# Patient Record
Sex: Male | Born: 1945 | Race: White | Hispanic: No | Marital: Married | Smoking: Former smoker
Health system: Southern US, Community
[De-identification: ages and names within clinical notes are randomized; demographics above are authoritative.]

## PROBLEM LIST (undated history)

## (undated) DIAGNOSIS — R200 Anesthesia of skin: Secondary | ICD-10-CM

## (undated) DIAGNOSIS — Z8739 Personal history of other diseases of the musculoskeletal system and connective tissue: Secondary | ICD-10-CM

## (undated) DIAGNOSIS — I639 Cerebral infarction, unspecified: Secondary | ICD-10-CM

## (undated) DIAGNOSIS — I10 Essential (primary) hypertension: Secondary | ICD-10-CM

## (undated) DIAGNOSIS — K644 Residual hemorrhoidal skin tags: Secondary | ICD-10-CM

## (undated) DIAGNOSIS — E119 Type 2 diabetes mellitus without complications: Secondary | ICD-10-CM

## (undated) DIAGNOSIS — C787 Secondary malignant neoplasm of liver and intrahepatic bile duct: Secondary | ICD-10-CM

## (undated) DIAGNOSIS — K219 Gastro-esophageal reflux disease without esophagitis: Secondary | ICD-10-CM

## (undated) DIAGNOSIS — F32A Depression, unspecified: Secondary | ICD-10-CM

## (undated) DIAGNOSIS — C20 Malignant neoplasm of rectum: Secondary | ICD-10-CM

## (undated) DIAGNOSIS — I2510 Atherosclerotic heart disease of native coronary artery without angina pectoris: Secondary | ICD-10-CM

## (undated) DIAGNOSIS — J189 Pneumonia, unspecified organism: Secondary | ICD-10-CM

## (undated) DIAGNOSIS — I739 Peripheral vascular disease, unspecified: Secondary | ICD-10-CM

## (undated) DIAGNOSIS — E785 Hyperlipidemia, unspecified: Secondary | ICD-10-CM

## (undated) DIAGNOSIS — Z9289 Personal history of other medical treatment: Secondary | ICD-10-CM

## (undated) DIAGNOSIS — K648 Other hemorrhoids: Secondary | ICD-10-CM

## (undated) DIAGNOSIS — F329 Major depressive disorder, single episode, unspecified: Secondary | ICD-10-CM

## (undated) HISTORY — PX: VASCULAR SURGERY: SHX849

## (undated) HISTORY — DX: Essential (primary) hypertension: I10

## (undated) HISTORY — DX: Hyperlipidemia, unspecified: E78.5

## (undated) HISTORY — DX: Gastro-esophageal reflux disease without esophagitis: K21.9

## (undated) HISTORY — DX: Cerebral infarction, unspecified: I63.9

## (undated) HISTORY — PX: COLONOSCOPY W/ BIOPSIES AND POLYPECTOMY: SHX1376

---

## 2006-04-30 DIAGNOSIS — I639 Cerebral infarction, unspecified: Secondary | ICD-10-CM

## 2006-04-30 HISTORY — PX: CAROTID ENDARTERECTOMY: SUR193

## 2006-04-30 HISTORY — DX: Cerebral infarction, unspecified: I63.9

## 2006-06-16 ENCOUNTER — Encounter: Admission: RE | Admit: 2006-06-16 | Discharge: 2006-09-14 | Payer: Self-pay | Admitting: Family Medicine

## 2006-09-12 ENCOUNTER — Inpatient Hospital Stay (HOSPITAL_COMMUNITY): Admission: EM | Admit: 2006-09-12 | Discharge: 2006-09-16 | Payer: Self-pay | Admitting: Emergency Medicine

## 2006-09-12 ENCOUNTER — Encounter: Payer: Self-pay | Admitting: Cardiology

## 2006-09-13 ENCOUNTER — Ambulatory Visit: Payer: Self-pay | Admitting: *Deleted

## 2006-09-16 ENCOUNTER — Inpatient Hospital Stay (HOSPITAL_COMMUNITY)
Admission: RE | Admit: 2006-09-16 | Discharge: 2006-10-02 | Payer: Self-pay | Admitting: Physical Medicine & Rehabilitation

## 2006-09-16 ENCOUNTER — Ambulatory Visit: Payer: Self-pay | Admitting: Physical Medicine & Rehabilitation

## 2006-10-03 ENCOUNTER — Encounter
Admission: RE | Admit: 2006-10-03 | Discharge: 2007-01-01 | Payer: Self-pay | Admitting: Physical Medicine & Rehabilitation

## 2006-10-24 ENCOUNTER — Ambulatory Visit: Payer: Self-pay | Admitting: *Deleted

## 2006-11-12 ENCOUNTER — Ambulatory Visit (HOSPITAL_COMMUNITY): Admission: RE | Admit: 2006-11-12 | Discharge: 2006-11-13 | Payer: Self-pay | Admitting: *Deleted

## 2006-11-12 ENCOUNTER — Ambulatory Visit: Payer: Self-pay | Admitting: *Deleted

## 2006-11-12 ENCOUNTER — Encounter (INDEPENDENT_AMBULATORY_CARE_PROVIDER_SITE_OTHER): Payer: Self-pay | Admitting: *Deleted

## 2006-11-14 ENCOUNTER — Ambulatory Visit: Payer: Self-pay | Admitting: Physical Medicine & Rehabilitation

## 2006-11-14 ENCOUNTER — Encounter
Admission: RE | Admit: 2006-11-14 | Discharge: 2007-02-12 | Payer: Self-pay | Admitting: Physical Medicine & Rehabilitation

## 2006-12-12 ENCOUNTER — Ambulatory Visit: Payer: Self-pay | Admitting: *Deleted

## 2007-01-14 ENCOUNTER — Encounter
Admission: RE | Admit: 2007-01-14 | Discharge: 2007-01-24 | Payer: Self-pay | Admitting: Physical Medicine & Rehabilitation

## 2007-02-05 ENCOUNTER — Ambulatory Visit: Payer: Self-pay | Admitting: Physical Medicine & Rehabilitation

## 2007-02-27 ENCOUNTER — Ambulatory Visit: Payer: Self-pay | Admitting: *Deleted

## 2007-03-11 ENCOUNTER — Encounter
Admission: RE | Admit: 2007-03-11 | Discharge: 2007-04-23 | Payer: Self-pay | Admitting: Physical Medicine & Rehabilitation

## 2007-05-01 HISTORY — PX: MASS BIOPSY: SHX5445

## 2007-05-13 ENCOUNTER — Encounter: Admission: RE | Admit: 2007-05-13 | Discharge: 2007-06-12 | Payer: Self-pay | Admitting: Family Medicine

## 2007-06-17 ENCOUNTER — Encounter: Admission: RE | Admit: 2007-06-17 | Discharge: 2007-08-18 | Payer: Self-pay | Admitting: Family Medicine

## 2007-08-07 ENCOUNTER — Ambulatory Visit: Payer: Self-pay | Admitting: *Deleted

## 2008-01-27 ENCOUNTER — Encounter: Admission: RE | Admit: 2008-01-27 | Discharge: 2008-04-26 | Payer: Self-pay | Admitting: Family Medicine

## 2008-04-15 ENCOUNTER — Encounter: Payer: Self-pay | Admitting: Internal Medicine

## 2008-07-14 ENCOUNTER — Encounter: Payer: Self-pay | Admitting: Internal Medicine

## 2008-08-05 ENCOUNTER — Ambulatory Visit: Payer: Self-pay | Admitting: *Deleted

## 2008-08-17 ENCOUNTER — Encounter: Payer: Self-pay | Admitting: Internal Medicine

## 2008-08-17 DIAGNOSIS — E785 Hyperlipidemia, unspecified: Secondary | ICD-10-CM

## 2008-08-17 DIAGNOSIS — K649 Unspecified hemorrhoids: Secondary | ICD-10-CM | POA: Insufficient documentation

## 2008-08-18 ENCOUNTER — Ambulatory Visit: Payer: Self-pay | Admitting: Internal Medicine

## 2008-08-18 DIAGNOSIS — R143 Flatulence: Secondary | ICD-10-CM

## 2008-08-18 DIAGNOSIS — R142 Eructation: Secondary | ICD-10-CM

## 2008-08-18 DIAGNOSIS — R1084 Generalized abdominal pain: Secondary | ICD-10-CM

## 2008-08-18 DIAGNOSIS — K625 Hemorrhage of anus and rectum: Secondary | ICD-10-CM

## 2008-08-18 DIAGNOSIS — R141 Gas pain: Secondary | ICD-10-CM

## 2008-08-18 DIAGNOSIS — K5900 Constipation, unspecified: Secondary | ICD-10-CM | POA: Insufficient documentation

## 2008-08-27 ENCOUNTER — Ambulatory Visit: Payer: Self-pay | Admitting: Internal Medicine

## 2008-08-27 ENCOUNTER — Encounter: Payer: Self-pay | Admitting: Internal Medicine

## 2008-08-27 LAB — CONVERTED CEMR LAB
BUN: 6 mg/dL (ref 6–23)
CEA: 4.3 ng/mL (ref 0.0–5.0)
Creatinine, Ser: 0.7 mg/dL (ref 0.4–1.5)

## 2008-08-30 ENCOUNTER — Encounter: Payer: Self-pay | Admitting: Internal Medicine

## 2008-08-30 DIAGNOSIS — R198 Other specified symptoms and signs involving the digestive system and abdomen: Secondary | ICD-10-CM

## 2008-08-31 ENCOUNTER — Ambulatory Visit: Payer: Self-pay | Admitting: Hematology and Oncology

## 2008-09-01 ENCOUNTER — Ambulatory Visit: Payer: Self-pay | Admitting: Cardiology

## 2008-09-01 ENCOUNTER — Encounter: Payer: Self-pay | Admitting: Internal Medicine

## 2008-09-02 ENCOUNTER — Encounter: Payer: Self-pay | Admitting: Internal Medicine

## 2008-09-02 ENCOUNTER — Ambulatory Visit: Admission: RE | Admit: 2008-09-02 | Discharge: 2008-11-17 | Payer: Self-pay | Admitting: Radiation Oncology

## 2008-09-02 LAB — COMPREHENSIVE METABOLIC PANEL
AST: 23 U/L (ref 0–37)
Albumin: 3.7 g/dL (ref 3.5–5.2)
BUN: 8 mg/dL (ref 6–23)
CO2: 29 mEq/L (ref 19–32)
Calcium: 9.4 mg/dL (ref 8.4–10.5)
Chloride: 104 mEq/L (ref 96–112)
Glucose, Bld: 87 mg/dL (ref 70–99)
Potassium: 4.2 mEq/L (ref 3.5–5.3)

## 2008-09-02 LAB — CBC WITH DIFFERENTIAL/PLATELET
Basophils Absolute: 0 10*3/uL (ref 0.0–0.1)
Eosinophils Absolute: 0.2 10*3/uL (ref 0.0–0.5)
HCT: 45.9 % (ref 38.4–49.9)
HGB: 16.1 g/dL (ref 13.0–17.1)
LYMPH%: 40.5 % (ref 14.0–49.0)
MONO#: 0.9 10*3/uL (ref 0.1–0.9)
NEUT%: 47.4 % (ref 39.0–75.0)
Platelets: 230 10*3/uL (ref 140–400)
WBC: 8.8 10*3/uL (ref 4.0–10.3)
lymph#: 3.6 10*3/uL — ABNORMAL HIGH (ref 0.9–3.3)

## 2008-09-03 ENCOUNTER — Encounter: Payer: Self-pay | Admitting: Internal Medicine

## 2008-09-09 ENCOUNTER — Ambulatory Visit (HOSPITAL_COMMUNITY): Admission: RE | Admit: 2008-09-09 | Discharge: 2008-09-09 | Payer: Self-pay | Admitting: Hematology and Oncology

## 2008-09-10 ENCOUNTER — Ambulatory Visit (HOSPITAL_COMMUNITY): Admission: RE | Admit: 2008-09-10 | Discharge: 2008-09-10 | Payer: Self-pay | Admitting: Hematology and Oncology

## 2008-09-20 LAB — CBC WITH DIFFERENTIAL/PLATELET
BASO%: 0.4 % (ref 0.0–2.0)
Eosinophils Absolute: 0.2 10*3/uL (ref 0.0–0.5)
LYMPH%: 37.3 % (ref 14.0–49.0)
MCHC: 35.2 g/dL (ref 32.0–36.0)
MCV: 91.8 fL (ref 79.3–98.0)
MONO%: 8.8 % (ref 0.0–14.0)
Platelets: 229 10*3/uL (ref 140–400)
RBC: 4.99 10*6/uL (ref 4.20–5.82)

## 2008-09-20 LAB — COMPREHENSIVE METABOLIC PANEL
Alkaline Phosphatase: 84 U/L (ref 39–117)
Glucose, Bld: 100 mg/dL — ABNORMAL HIGH (ref 70–99)
Sodium: 136 mEq/L (ref 135–145)
Total Bilirubin: 0.3 mg/dL (ref 0.3–1.2)
Total Protein: 7.1 g/dL (ref 6.0–8.3)

## 2008-09-21 ENCOUNTER — Encounter: Payer: Self-pay | Admitting: Internal Medicine

## 2008-09-30 LAB — CBC WITH DIFFERENTIAL/PLATELET
BASO%: 0.3 % (ref 0.0–2.0)
Eosinophils Absolute: 0 10*3/uL (ref 0.0–0.5)
LYMPH%: 23.8 % (ref 14.0–49.0)
MCHC: 34.8 g/dL (ref 32.0–36.0)
MONO#: 0.8 10*3/uL (ref 0.1–0.9)
NEUT#: 2.8 10*3/uL (ref 1.5–6.5)
Platelets: 223 10*3/uL (ref 140–400)
RBC: 4.44 10*6/uL (ref 4.20–5.82)
RDW: 12.4 % (ref 11.0–14.6)
WBC: 4.8 10*3/uL (ref 4.0–10.3)

## 2008-10-08 LAB — CBC WITH DIFFERENTIAL/PLATELET
BASO%: 0.1 % (ref 0.0–2.0)
Eosinophils Absolute: 0.1 10*3/uL (ref 0.0–0.5)
HCT: 41 % (ref 38.4–49.9)
MCHC: 35.5 g/dL (ref 32.0–36.0)
MONO#: 0.7 10*3/uL (ref 0.1–0.9)
NEUT#: 3.1 10*3/uL (ref 1.5–6.5)
NEUT%: 68.4 % (ref 39.0–75.0)
RBC: 4.36 10*6/uL (ref 4.20–5.82)
WBC: 4.6 10*3/uL (ref 4.0–10.3)
lymph#: 0.6 10*3/uL — ABNORMAL LOW (ref 0.9–3.3)

## 2008-10-12 ENCOUNTER — Encounter: Payer: Self-pay | Admitting: Internal Medicine

## 2008-10-12 LAB — CBC WITH DIFFERENTIAL/PLATELET
Basophils Absolute: 0 10*3/uL (ref 0.0–0.1)
HCT: 41.9 % (ref 38.4–49.9)
HGB: 14.7 g/dL (ref 13.0–17.1)
MONO#: 0.6 10*3/uL (ref 0.1–0.9)
NEUT%: 71.8 % (ref 39.0–75.0)
Platelets: 224 10*3/uL (ref 140–400)
WBC: 4.7 10*3/uL (ref 4.0–10.3)
lymph#: 0.5 10*3/uL — ABNORMAL LOW (ref 0.9–3.3)

## 2008-10-12 LAB — BASIC METABOLIC PANEL
BUN: 9 mg/dL (ref 6–23)
CO2: 26 mEq/L (ref 19–32)
Chloride: 103 mEq/L (ref 96–112)
Creatinine, Ser: 0.85 mg/dL (ref 0.40–1.50)
Glucose, Bld: 135 mg/dL — ABNORMAL HIGH (ref 70–99)

## 2008-10-13 ENCOUNTER — Ambulatory Visit: Payer: Self-pay | Admitting: Hematology and Oncology

## 2008-10-13 LAB — URINALYSIS, MICROSCOPIC - CHCC
Leukocyte Esterase: NEGATIVE
Nitrite: NEGATIVE
Protein: 30 mg/dL
pH: 6 (ref 4.6–8.0)

## 2008-10-19 LAB — CBC WITH DIFFERENTIAL/PLATELET
BASO%: 0 % (ref 0.0–2.0)
Basophils Absolute: 0 10*3/uL (ref 0.0–0.1)
Eosinophils Absolute: 0.1 10*3/uL (ref 0.0–0.5)
HCT: 41.5 % (ref 38.4–49.9)
HGB: 14.8 g/dL (ref 13.0–17.1)
LYMPH%: 7.7 % — ABNORMAL LOW (ref 14.0–49.0)
MONO#: 0.6 10*3/uL (ref 0.1–0.9)
NEUT#: 4.6 10*3/uL (ref 1.5–6.5)
NEUT%: 79.7 % — ABNORMAL HIGH (ref 39.0–75.0)
Platelets: 312 10*3/uL (ref 140–400)
WBC: 5.8 10*3/uL (ref 4.0–10.3)
lymph#: 0.4 10*3/uL — ABNORMAL LOW (ref 0.9–3.3)

## 2008-10-21 LAB — URINALYSIS, MICROSCOPIC - CHCC
Ketones: NEGATIVE mg/dL
Nitrite: NEGATIVE
Protein: <30 mg/dL
Specific Gravity, Urine: 1.02 (ref 1.003–1.035)

## 2008-10-23 LAB — URINE CULTURE

## 2008-10-27 ENCOUNTER — Encounter: Payer: Self-pay | Admitting: Internal Medicine

## 2008-10-27 LAB — CBC WITH DIFFERENTIAL/PLATELET
BASO%: 0.2 % (ref 0.0–2.0)
Eosinophils Absolute: 0.1 10*3/uL (ref 0.0–0.5)
HCT: 39.9 % (ref 38.4–49.9)
MCHC: 35.2 g/dL (ref 32.0–36.0)
MONO#: 0.4 10*3/uL (ref 0.1–0.9)
NEUT#: 3.7 10*3/uL (ref 1.5–6.5)
Platelets: 326 10*3/uL (ref 140–400)
RBC: 4.23 10*6/uL (ref 4.20–5.82)
WBC: 4.7 10*3/uL (ref 4.0–10.3)
lymph#: 0.4 10*3/uL — ABNORMAL LOW (ref 0.9–3.3)

## 2008-10-30 ENCOUNTER — Emergency Department (HOSPITAL_COMMUNITY): Admission: EM | Admit: 2008-10-30 | Discharge: 2008-10-30 | Payer: Self-pay | Admitting: Emergency Medicine

## 2008-11-04 LAB — CBC WITH DIFFERENTIAL/PLATELET
BASO%: 0.8 % (ref 0.0–2.0)
Basophils Absolute: 0 10*3/uL (ref 0.0–0.1)
EOS%: 0.8 % (ref 0.0–7.0)
HCT: 39.6 % (ref 38.4–49.9)
HGB: 13.7 g/dL (ref 13.0–17.1)
MCH: 31.6 pg (ref 27.2–33.4)
MCHC: 34.6 g/dL (ref 32.0–36.0)
MCV: 91.5 fL (ref 79.3–98.0)
MONO%: 26.8 % — ABNORMAL HIGH (ref 0.0–14.0)
NEUT%: 59.3 % (ref 39.0–75.0)

## 2008-11-04 LAB — BASIC METABOLIC PANEL
BUN: 7 mg/dL (ref 6–23)
Calcium: 8.8 mg/dL (ref 8.4–10.5)
Creatinine, Ser: 0.86 mg/dL (ref 0.40–1.50)
Glucose, Bld: 110 mg/dL — ABNORMAL HIGH (ref 70–99)

## 2008-11-05 ENCOUNTER — Encounter: Payer: Self-pay | Admitting: Internal Medicine

## 2008-11-09 ENCOUNTER — Ambulatory Visit: Payer: Self-pay | Admitting: Internal Medicine

## 2008-11-09 DIAGNOSIS — C785 Secondary malignant neoplasm of large intestine and rectum: Secondary | ICD-10-CM | POA: Insufficient documentation

## 2008-11-09 DIAGNOSIS — R112 Nausea with vomiting, unspecified: Secondary | ICD-10-CM

## 2008-11-16 ENCOUNTER — Ambulatory Visit: Payer: Self-pay | Admitting: Hematology and Oncology

## 2008-11-18 LAB — CBC WITH DIFFERENTIAL/PLATELET
BASO%: 0.1 % (ref 0.0–2.0)
LYMPH%: 13.6 % — ABNORMAL LOW (ref 14.0–49.0)
MCHC: 34.3 g/dL (ref 32.0–36.0)
MONO#: 0.5 10*3/uL (ref 0.1–0.9)
RBC: 3.95 10*6/uL — ABNORMAL LOW (ref 4.20–5.82)
RDW: 16.1 % — ABNORMAL HIGH (ref 11.0–14.6)
WBC: 3.3 10*3/uL — ABNORMAL LOW (ref 4.0–10.3)
lymph#: 0.4 10*3/uL — ABNORMAL LOW (ref 0.9–3.3)

## 2008-11-30 ENCOUNTER — Encounter: Payer: Self-pay | Admitting: Internal Medicine

## 2008-11-30 LAB — CBC WITH DIFFERENTIAL/PLATELET
BASO%: 0.7 % (ref 0.0–2.0)
Basophils Absolute: 0 10*3/uL (ref 0.0–0.1)
EOS%: 7.4 % — ABNORMAL HIGH (ref 0.0–7.0)
HGB: 13.6 g/dL (ref 13.0–17.1)
MCH: 32.2 pg (ref 27.2–33.4)
MCHC: 34.3 g/dL (ref 32.0–36.0)
MCV: 93.9 fL (ref 79.3–98.0)
MONO%: 16.6 % — ABNORMAL HIGH (ref 0.0–14.0)
RDW: 15.8 % — ABNORMAL HIGH (ref 11.0–14.6)
lymph#: 0.6 10*3/uL — ABNORMAL LOW (ref 0.9–3.3)

## 2008-11-30 LAB — COMPREHENSIVE METABOLIC PANEL
ALT: 14 U/L (ref 0–53)
AST: 16 U/L (ref 0–37)
Albumin: 4 g/dL (ref 3.5–5.2)
Alkaline Phosphatase: 76 U/L (ref 39–117)
BUN: 6 mg/dL (ref 6–23)
Chloride: 107 mEq/L (ref 96–112)
Creatinine, Ser: 0.75 mg/dL (ref 0.40–1.50)
Potassium: 4.3 mEq/L (ref 3.5–5.3)

## 2008-12-15 ENCOUNTER — Encounter: Payer: Self-pay | Admitting: Internal Medicine

## 2009-01-17 ENCOUNTER — Ambulatory Visit: Payer: Self-pay | Admitting: Oncology

## 2009-01-20 ENCOUNTER — Ambulatory Visit (HOSPITAL_COMMUNITY): Admission: RE | Admit: 2009-01-20 | Discharge: 2009-01-20 | Payer: Self-pay | Admitting: Surgery

## 2009-01-20 ENCOUNTER — Encounter (INDEPENDENT_AMBULATORY_CARE_PROVIDER_SITE_OTHER): Payer: Self-pay | Admitting: Surgery

## 2009-02-08 LAB — CBC WITH DIFFERENTIAL/PLATELET
BASO%: 0.4 % (ref 0.0–2.0)
Basophils Absolute: 0 10*3/uL (ref 0.0–0.1)
EOS%: 2.6 % (ref 0.0–7.0)
HGB: 13.8 g/dL (ref 13.0–17.1)
MCH: 34.2 pg — ABNORMAL HIGH (ref 27.2–33.4)
MCV: 100.1 fL — ABNORMAL HIGH (ref 79.3–98.0)
MONO%: 12.8 % (ref 0.0–14.0)
RBC: 4.04 10*6/uL — ABNORMAL LOW (ref 4.20–5.82)
RDW: 13.2 % (ref 11.0–14.6)
lymph#: 0.9 10*3/uL (ref 0.9–3.3)

## 2009-02-08 LAB — COMPREHENSIVE METABOLIC PANEL
ALT: 16 U/L (ref 0–53)
AST: 20 U/L (ref 0–37)
Albumin: 4 g/dL (ref 3.5–5.2)
Alkaline Phosphatase: 69 U/L (ref 39–117)
Calcium: 9 mg/dL (ref 8.4–10.5)
Chloride: 104 mEq/L (ref 96–112)
Potassium: 4.2 mEq/L (ref 3.5–5.3)
Sodium: 139 mEq/L (ref 135–145)
Total Protein: 6.1 g/dL (ref 6.0–8.3)

## 2009-02-10 ENCOUNTER — Encounter: Payer: Self-pay | Admitting: Internal Medicine

## 2009-02-18 ENCOUNTER — Encounter: Payer: Self-pay | Admitting: Internal Medicine

## 2009-02-28 ENCOUNTER — Ambulatory Visit: Payer: Self-pay | Admitting: Oncology

## 2009-04-11 ENCOUNTER — Ambulatory Visit: Payer: Self-pay | Admitting: Oncology

## 2009-04-13 ENCOUNTER — Encounter: Payer: Self-pay | Admitting: Oncology

## 2009-04-13 ENCOUNTER — Ambulatory Visit: Admission: RE | Admit: 2009-04-13 | Discharge: 2009-04-13 | Payer: Self-pay | Admitting: Oncology

## 2009-04-13 ENCOUNTER — Ambulatory Visit: Payer: Self-pay | Admitting: Vascular Surgery

## 2009-05-09 ENCOUNTER — Ambulatory Visit: Payer: Self-pay | Admitting: Hematology and Oncology

## 2009-05-10 LAB — CBC WITH DIFFERENTIAL/PLATELET
BASO%: 0.5 % (ref 0.0–2.0)
Basophils Absolute: 0 10*3/uL (ref 0.0–0.1)
Eosinophils Absolute: 0.1 10*3/uL (ref 0.0–0.5)
HGB: 15.2 g/dL (ref 13.0–17.1)
LYMPH%: 26.1 % (ref 14.0–49.0)
MCH: 32.9 pg (ref 27.2–33.4)
MCHC: 34 g/dL (ref 32.0–36.0)
MCV: 96.5 fL (ref 79.3–98.0)
MONO%: 14.1 % — ABNORMAL HIGH (ref 0.0–14.0)
RBC: 4.64 10*6/uL (ref 4.20–5.82)

## 2009-05-10 LAB — COMPREHENSIVE METABOLIC PANEL
ALT: 24 U/L (ref 0–53)
Albumin: 4.2 g/dL (ref 3.5–5.2)
BUN: 11 mg/dL (ref 6–23)
Chloride: 103 mEq/L (ref 96–112)
Creatinine, Ser: 0.96 mg/dL (ref 0.40–1.50)
Total Bilirubin: 0.5 mg/dL (ref 0.3–1.2)

## 2009-05-10 LAB — CEA: CEA: 2.7 ng/mL (ref 0.0–5.0)

## 2009-05-11 ENCOUNTER — Encounter: Payer: Self-pay | Admitting: Internal Medicine

## 2009-05-13 ENCOUNTER — Ambulatory Visit (HOSPITAL_COMMUNITY): Admission: RE | Admit: 2009-05-13 | Discharge: 2009-05-13 | Payer: Self-pay | Admitting: Hematology and Oncology

## 2009-08-11 ENCOUNTER — Ambulatory Visit: Payer: Self-pay | Admitting: Thoracic Diseases

## 2009-08-25 ENCOUNTER — Ambulatory Visit: Payer: Self-pay | Admitting: Hematology and Oncology

## 2009-08-25 LAB — CBC WITH DIFFERENTIAL/PLATELET
EOS%: 2.3 % (ref 0.0–7.0)
Eosinophils Absolute: 0.1 10*3/uL (ref 0.0–0.5)
MCV: 97.5 fL (ref 79.3–98.0)
MONO%: 7.8 % (ref 0.0–14.0)
NEUT#: 3.8 10*3/uL (ref 1.5–6.5)
RBC: 4.49 10*6/uL (ref 4.20–5.82)
RDW: 13.8 % (ref 11.0–14.6)

## 2009-08-25 LAB — COMPREHENSIVE METABOLIC PANEL
AST: 32 U/L (ref 0–37)
Albumin: 3.6 g/dL (ref 3.5–5.2)
Alkaline Phosphatase: 67 U/L (ref 39–117)
Glucose, Bld: 135 mg/dL — ABNORMAL HIGH (ref 70–99)
Potassium: 4.1 mEq/L (ref 3.5–5.3)
Sodium: 139 mEq/L (ref 135–145)
Total Protein: 6.8 g/dL (ref 6.0–8.3)

## 2009-08-26 ENCOUNTER — Ambulatory Visit (HOSPITAL_COMMUNITY): Admission: RE | Admit: 2009-08-26 | Discharge: 2009-08-26 | Payer: Self-pay | Admitting: Hematology and Oncology

## 2009-08-31 ENCOUNTER — Encounter: Payer: Self-pay | Admitting: Internal Medicine

## 2009-10-18 ENCOUNTER — Encounter: Admission: RE | Admit: 2009-10-18 | Discharge: 2009-10-18 | Payer: Self-pay | Admitting: Family Medicine

## 2009-11-08 ENCOUNTER — Ambulatory Visit: Payer: Self-pay | Admitting: Vascular Surgery

## 2009-11-14 ENCOUNTER — Ambulatory Visit: Payer: Self-pay | Admitting: Vascular Surgery

## 2009-11-14 ENCOUNTER — Ambulatory Visit (HOSPITAL_COMMUNITY): Admission: RE | Admit: 2009-11-14 | Discharge: 2009-11-14 | Payer: Self-pay | Admitting: Vascular Surgery

## 2009-12-09 ENCOUNTER — Ambulatory Visit: Payer: Self-pay | Admitting: Hematology and Oncology

## 2009-12-13 LAB — CBC WITH DIFFERENTIAL/PLATELET
Basophils Absolute: 0 10*3/uL (ref 0.0–0.1)
Eosinophils Absolute: 0.2 10*3/uL (ref 0.0–0.5)
HCT: 42.6 % (ref 38.4–49.9)
HGB: 14.7 g/dL (ref 13.0–17.1)
MONO#: 0.5 10*3/uL (ref 0.1–0.9)
NEUT#: 4 10*3/uL (ref 1.5–6.5)
RDW: 13.7 % (ref 11.0–14.6)
lymph#: 1.4 10*3/uL (ref 0.9–3.3)

## 2009-12-13 LAB — COMPREHENSIVE METABOLIC PANEL
AST: 38 U/L — ABNORMAL HIGH (ref 0–37)
Albumin: 4.5 g/dL (ref 3.5–5.2)
BUN: 10 mg/dL (ref 6–23)
CO2: 23 mEq/L (ref 19–32)
Calcium: 9.5 mg/dL (ref 8.4–10.5)
Chloride: 104 mEq/L (ref 96–112)
Glucose, Bld: 161 mg/dL — ABNORMAL HIGH (ref 70–99)
Potassium: 4.2 mEq/L (ref 3.5–5.3)

## 2009-12-13 LAB — LACTATE DEHYDROGENASE: LDH: 192 U/L (ref 94–250)

## 2009-12-15 ENCOUNTER — Encounter: Payer: Self-pay | Admitting: Internal Medicine

## 2010-02-08 ENCOUNTER — Ambulatory Visit: Payer: Self-pay | Admitting: Vascular Surgery

## 2010-05-10 ENCOUNTER — Ambulatory Visit: Admit: 2010-05-10 | Payer: Self-pay | Admitting: Vascular Surgery

## 2010-05-10 ENCOUNTER — Ambulatory Visit
Admission: RE | Admit: 2010-05-10 | Discharge: 2010-05-10 | Payer: Self-pay | Source: Home / Self Care | Attending: Vascular Surgery | Admitting: Vascular Surgery

## 2010-05-18 ENCOUNTER — Other Ambulatory Visit: Payer: Self-pay | Admitting: Hematology and Oncology

## 2010-05-18 DIAGNOSIS — C210 Malignant neoplasm of anus, unspecified: Secondary | ICD-10-CM

## 2010-05-19 ENCOUNTER — Other Ambulatory Visit: Payer: Self-pay | Admitting: Hematology and Oncology

## 2010-05-19 DIAGNOSIS — C210 Malignant neoplasm of anus, unspecified: Secondary | ICD-10-CM

## 2010-05-21 ENCOUNTER — Encounter: Payer: Self-pay | Admitting: Physical Medicine & Rehabilitation

## 2010-05-22 ENCOUNTER — Encounter: Payer: Self-pay | Admitting: Hematology and Oncology

## 2010-05-30 NOTE — Letter (Signed)
[facility]   Imported By: Lester Paxtang 09/16/2009 12:34:50  _____________________________________________________________________  External Attachment:    Type:   Image     Comment:   External Document

## 2010-05-30 NOTE — Letter (Signed)
[facility]   Imported By: Lennie Odor 12/28/2009 14:46:50  _____________________________________________________________________  External Attachment:    Type:   Image     Comment:   External Document

## 2010-05-30 NOTE — Letter (Signed)
[facility]   Imported By: Sherian Rein 06/02/2009 07:24:04  _____________________________________________________________________  External Attachment:    Type:   Image     Comment:   External Document

## 2010-06-13 ENCOUNTER — Other Ambulatory Visit: Payer: Self-pay | Admitting: Hematology and Oncology

## 2010-06-13 DIAGNOSIS — C210 Malignant neoplasm of anus, unspecified: Secondary | ICD-10-CM

## 2010-06-14 ENCOUNTER — Other Ambulatory Visit (HOSPITAL_COMMUNITY): Payer: Self-pay

## 2010-06-14 NOTE — Procedures (Unsigned)
VASCULAR LAB EXAM  INDICATION:  Preoperative vein mapping.   HISTORY: Diabetes:  Yes. Cardiac:  No. Hypertension:  Yes.  EXAM:  Left GSV mapping.  IMPRESSION:  Patent and compressible left greater saphenous vein with diameter measurements ranging from 0.11 to 0.52 cm.      ___________________________________________ Di Kindle. Edilia Bo, M.D.  EM/MEDQ  D:  05/10/2010  T:  05/10/2010  Job:  [id number]

## 2010-06-19 ENCOUNTER — Other Ambulatory Visit: Payer: Self-pay | Admitting: Hematology and Oncology

## 2010-06-19 ENCOUNTER — Encounter (HOSPITAL_BASED_OUTPATIENT_CLINIC_OR_DEPARTMENT_OTHER): Payer: Medicare Other | Admitting: Hematology and Oncology

## 2010-06-19 ENCOUNTER — Ambulatory Visit (HOSPITAL_COMMUNITY)
Admission: RE | Admit: 2010-06-19 | Discharge: 2010-06-19 | Disposition: A | Discharge status: Home / Self Care | Payer: Medicare Other | Source: Ambulatory Visit | Attending: Hematology and Oncology | Admitting: Hematology and Oncology

## 2010-06-19 DIAGNOSIS — C210 Malignant neoplasm of anus, unspecified: Secondary | ICD-10-CM

## 2010-06-19 LAB — CMP (CANCER CENTER ONLY)
ALT(SGPT): 74 U/L — ABNORMAL HIGH (ref 10–47)
AST: 65 U/L — ABNORMAL HIGH (ref 11–38)
Albumin: 3.4 g/dL (ref 3.3–5.5)
Alkaline Phosphatase: 74 U/L (ref 26–84)
BUN, Bld: 11 mg/dL (ref 7–22)
CO2: 28 mEq/L (ref 18–33)
Calcium: 9.2 mg/dL (ref 8.0–10.3)
Creat: 0.9 mg/dl (ref 0.6–1.2)
Potassium: 4.8 mEq/L — ABNORMAL HIGH (ref 3.3–4.7)
Sodium: 142 mEq/L (ref 128–145)
Total Bilirubin: 0.7 mg/dl (ref 0.20–1.60)
Total Protein: 7.3 g/dL (ref 6.4–8.1)

## 2010-06-19 LAB — CBC WITH DIFFERENTIAL/PLATELET
BASO%: 0.5 % (ref 0.0–2.0)
EOS%: 2 % (ref 0.0–7.0)
HCT: 44.3 % (ref 38.4–49.9)
MCHC: 33.6 g/dL (ref 32.0–36.0)
MCV: 98.8 fL — ABNORMAL HIGH (ref 79.3–98.0)
Platelets: 260 10*3/uL (ref 140–400)
RBC: 4.49 10*6/uL (ref 4.20–5.82)
WBC: 5.3 10*3/uL (ref 4.0–10.3)

## 2010-06-19 MED ORDER — IOHEXOL 300 MG/ML  SOLN
100.0000 mL | Freq: Once | INTRAMUSCULAR | Status: AC | PRN
  Administered 2010-06-19: 100 mL via INTRAVENOUS

## 2010-06-21 ENCOUNTER — Encounter: Payer: Self-pay | Admitting: Internal Medicine

## 2010-06-21 ENCOUNTER — Other Ambulatory Visit: Payer: Self-pay | Admitting: Hematology and Oncology

## 2010-06-21 ENCOUNTER — Encounter (HOSPITAL_BASED_OUTPATIENT_CLINIC_OR_DEPARTMENT_OTHER): Payer: Medicare Other | Admitting: Hematology and Oncology

## 2010-06-21 DIAGNOSIS — C210 Malignant neoplasm of anus, unspecified: Secondary | ICD-10-CM

## 2010-07-04 ENCOUNTER — Encounter (HOSPITAL_COMMUNITY): Payer: Self-pay

## 2010-07-04 ENCOUNTER — Encounter (HOSPITAL_COMMUNITY)
Admission: RE | Admit: 2010-07-04 | Discharge: 2010-07-04 | Disposition: A | Discharge status: Home / Self Care | Payer: Medicare Other | Source: Ambulatory Visit | Attending: Hematology and Oncology | Admitting: Hematology and Oncology

## 2010-07-04 DIAGNOSIS — I77810 Thoracic aortic ectasia: Secondary | ICD-10-CM | POA: Insufficient documentation

## 2010-07-04 DIAGNOSIS — C210 Malignant neoplasm of anus, unspecified: Secondary | ICD-10-CM | POA: Insufficient documentation

## 2010-07-04 DIAGNOSIS — K8020 Calculus of gallbladder without cholecystitis without obstruction: Secondary | ICD-10-CM | POA: Insufficient documentation

## 2010-07-04 DIAGNOSIS — I700 Atherosclerosis of aorta: Secondary | ICD-10-CM | POA: Insufficient documentation

## 2010-07-04 DIAGNOSIS — K7689 Other specified diseases of liver: Secondary | ICD-10-CM | POA: Insufficient documentation

## 2010-07-04 DIAGNOSIS — I723 Aneurysm of iliac artery: Secondary | ICD-10-CM | POA: Insufficient documentation

## 2010-07-04 DIAGNOSIS — I2510 Atherosclerotic heart disease of native coronary artery without angina pectoris: Secondary | ICD-10-CM | POA: Insufficient documentation

## 2010-07-04 LAB — GLUCOSE, CAPILLARY: Glucose-Capillary: 141 mg/dL — ABNORMAL HIGH (ref 70–99)

## 2010-07-04 MED ORDER — FLUDEOXYGLUCOSE F - 18 (FDG) INJECTION
18.0000 | Freq: Once | INTRAVENOUS | Status: AC | PRN
  Administered 2010-07-04: 18 via INTRAVENOUS

## 2010-07-11 ENCOUNTER — Other Ambulatory Visit: Payer: Self-pay | Admitting: Hematology and Oncology

## 2010-07-11 DIAGNOSIS — B009 Herpesviral infection, unspecified: Secondary | ICD-10-CM

## 2010-07-11 NOTE — Letter (Signed)
Summary: [facility]  [facility]   Imported By: Lennie Odor 07/06/2010 14:12:40  _____________________________________________________________________  External Attachment:    Type:   Image     Comment:   External Document

## 2010-07-15 LAB — POCT I-STAT, CHEM 8
BUN: 12 mg/dL (ref 6–23)
Hemoglobin: 15.3 g/dL (ref 13.0–17.0)
Potassium: 4.3 mEq/L (ref 3.5–5.1)
Sodium: 140 mEq/L (ref 135–145)
TCO2: 24 mmol/L (ref 0–100)

## 2010-07-15 LAB — GLUCOSE, CAPILLARY
Glucose-Capillary: 129 mg/dL — ABNORMAL HIGH (ref 70–99)
Glucose-Capillary: 136 mg/dL — ABNORMAL HIGH (ref 70–99)

## 2010-07-19 ENCOUNTER — Other Ambulatory Visit: Payer: Self-pay | Admitting: Hematology and Oncology

## 2010-07-19 ENCOUNTER — Ambulatory Visit (HOSPITAL_COMMUNITY)
Admission: RE | Admit: 2010-07-19 | Discharge: 2010-07-19 | Disposition: A | Discharge status: Home / Self Care | Payer: Medicare Other | Source: Ambulatory Visit | Attending: Hematology and Oncology | Admitting: Hematology and Oncology

## 2010-07-19 ENCOUNTER — Ambulatory Visit (HOSPITAL_COMMUNITY): Payer: Medicare Other

## 2010-07-19 DIAGNOSIS — R29898 Other symptoms and signs involving the musculoskeletal system: Secondary | ICD-10-CM | POA: Insufficient documentation

## 2010-07-19 DIAGNOSIS — Z01812 Encounter for preprocedural laboratory examination: Secondary | ICD-10-CM | POA: Insufficient documentation

## 2010-07-19 DIAGNOSIS — M109 Gout, unspecified: Secondary | ICD-10-CM | POA: Insufficient documentation

## 2010-07-19 DIAGNOSIS — C210 Malignant neoplasm of anus, unspecified: Secondary | ICD-10-CM | POA: Insufficient documentation

## 2010-07-19 DIAGNOSIS — K219 Gastro-esophageal reflux disease without esophagitis: Secondary | ICD-10-CM | POA: Insufficient documentation

## 2010-07-19 DIAGNOSIS — K769 Liver disease, unspecified: Secondary | ICD-10-CM

## 2010-07-19 DIAGNOSIS — B009 Herpesviral infection, unspecified: Secondary | ICD-10-CM

## 2010-07-19 DIAGNOSIS — I10 Essential (primary) hypertension: Secondary | ICD-10-CM | POA: Insufficient documentation

## 2010-07-19 DIAGNOSIS — I69998 Other sequelae following unspecified cerebrovascular disease: Secondary | ICD-10-CM | POA: Insufficient documentation

## 2010-07-19 DIAGNOSIS — Z5309 Procedure and treatment not carried out because of other contraindication: Secondary | ICD-10-CM | POA: Insufficient documentation

## 2010-07-19 DIAGNOSIS — Z79899 Other long term (current) drug therapy: Secondary | ICD-10-CM | POA: Insufficient documentation

## 2010-07-19 DIAGNOSIS — K7689 Other specified diseases of liver: Secondary | ICD-10-CM | POA: Insufficient documentation

## 2010-07-19 DIAGNOSIS — E119 Type 2 diabetes mellitus without complications: Secondary | ICD-10-CM | POA: Insufficient documentation

## 2010-07-19 LAB — PROTIME-INR
INR: 0.99 (ref 0.00–1.49)
Prothrombin Time: 13.3 seconds (ref 11.6–15.2)

## 2010-07-19 LAB — CBC
MCH: 32.4 pg (ref 26.0–34.0)
MCHC: 33.4 g/dL (ref 30.0–36.0)
Platelets: 249 10*3/uL (ref 150–400)

## 2010-07-19 LAB — GLUCOSE, CAPILLARY: Glucose-Capillary: 174 mg/dL — ABNORMAL HIGH (ref 70–99)

## 2010-08-01 ENCOUNTER — Encounter (HOSPITAL_COMMUNITY): Payer: Medicare Other | Attending: Surgery

## 2010-08-01 ENCOUNTER — Other Ambulatory Visit: Payer: Self-pay | Admitting: Surgery

## 2010-08-01 DIAGNOSIS — Z01812 Encounter for preprocedural laboratory examination: Secondary | ICD-10-CM | POA: Insufficient documentation

## 2010-08-01 DIAGNOSIS — K219 Gastro-esophageal reflux disease without esophagitis: Secondary | ICD-10-CM | POA: Insufficient documentation

## 2010-08-01 DIAGNOSIS — E119 Type 2 diabetes mellitus without complications: Secondary | ICD-10-CM | POA: Insufficient documentation

## 2010-08-01 DIAGNOSIS — Z01810 Encounter for preprocedural cardiovascular examination: Secondary | ICD-10-CM | POA: Insufficient documentation

## 2010-08-01 DIAGNOSIS — Z85048 Personal history of other malignant neoplasm of rectum, rectosigmoid junction, and anus: Secondary | ICD-10-CM | POA: Insufficient documentation

## 2010-08-01 DIAGNOSIS — Z8673 Personal history of transient ischemic attack (TIA), and cerebral infarction without residual deficits: Secondary | ICD-10-CM | POA: Insufficient documentation

## 2010-08-01 DIAGNOSIS — E7800 Pure hypercholesterolemia, unspecified: Secondary | ICD-10-CM | POA: Insufficient documentation

## 2010-08-01 DIAGNOSIS — I10 Essential (primary) hypertension: Secondary | ICD-10-CM | POA: Insufficient documentation

## 2010-08-01 DIAGNOSIS — K769 Liver disease, unspecified: Secondary | ICD-10-CM | POA: Insufficient documentation

## 2010-08-01 DIAGNOSIS — R9431 Abnormal electrocardiogram [ECG] [EKG]: Secondary | ICD-10-CM | POA: Insufficient documentation

## 2010-08-01 LAB — COMPREHENSIVE METABOLIC PANEL
ALT: 69 U/L — ABNORMAL HIGH (ref 0–53)
BUN: 10 mg/dL (ref 6–23)
CO2: 26 mEq/L (ref 19–32)
Calcium: 9.2 mg/dL (ref 8.4–10.5)
Creatinine, Ser: 1.01 mg/dL (ref 0.4–1.5)
GFR calc non Af Amer: >60 mL/min (ref >60)
Glucose, Bld: 213 mg/dL — ABNORMAL HIGH (ref 70–99)
Sodium: 138 mEq/L (ref 135–145)

## 2010-08-01 LAB — DIFFERENTIAL
Basophils Relative: 0 % (ref 0–1)
Eosinophils Absolute: 0.1 10*3/uL (ref 0.0–0.7)
Eosinophils Relative: 2 % (ref 0–5)
Monocytes Absolute: 0.7 10*3/uL (ref 0.1–1.0)
Monocytes Relative: 9 % (ref 3–12)

## 2010-08-01 LAB — CBC
MCH: 31.9 pg (ref 26.0–34.0)
MCHC: 32.7 g/dL (ref 30.0–36.0)
Platelets: 213 10*3/uL (ref 150–400)
RDW: 12.9 % (ref 11.5–15.5)

## 2010-08-01 LAB — SURGICAL PCR SCREEN: Staphylococcus aureus: NEGATIVE

## 2010-08-01 LAB — PROTIME-INR: Prothrombin Time: 13.3 seconds (ref 11.6–15.2)

## 2010-08-04 LAB — COMPREHENSIVE METABOLIC PANEL
ALT: 18 U/L (ref 0–53)
AST: 22 U/L (ref 0–37)
CO2: 27 mEq/L (ref 19–32)
Calcium: 9 mg/dL (ref 8.4–10.5)
GFR calc Af Amer: >60 mL/min (ref >60)
Sodium: 138 mEq/L (ref 135–145)
Total Protein: 6 g/dL (ref 6.0–8.3)

## 2010-08-04 LAB — DIFFERENTIAL
Eosinophils Absolute: 0.3 10*3/uL (ref 0.0–0.7)
Eosinophils Relative: 7 % — ABNORMAL HIGH (ref 0–5)
Lymphs Abs: 0.7 10*3/uL (ref 0.7–4.0)
Monocytes Relative: 12 % (ref 3–12)

## 2010-08-04 LAB — CBC
MCHC: 33.8 g/dL (ref 30.0–36.0)
RBC: 3.97 MIL/uL — ABNORMAL LOW (ref 4.22–5.81)

## 2010-08-04 LAB — GLUCOSE, CAPILLARY: Glucose-Capillary: 75 mg/dL (ref 70–99)

## 2010-08-06 LAB — URINALYSIS, ROUTINE W REFLEX MICROSCOPIC
Leukocytes, UA: NEGATIVE
Nitrite: POSITIVE — AB
Specific Gravity, Urine: 1.013 (ref 1.005–1.030)
pH: 6 (ref 5.0–8.0)

## 2010-08-06 LAB — HEPATIC FUNCTION PANEL
ALT: 14 U/L (ref 0–53)
Indirect Bilirubin: 0.7 mg/dL (ref 0.3–0.9)
Total Protein: 6 g/dL (ref 6.0–8.3)

## 2010-08-06 LAB — DIFFERENTIAL
Basophils Relative: 1 % (ref 0–1)
Lymphs Abs: 0.2 10*3/uL — ABNORMAL LOW (ref 0.7–4.0)
Monocytes Relative: 9 % (ref 3–12)
Neutro Abs: 2.5 10*3/uL (ref 1.7–7.7)
Neutrophils Relative %: 79 % — ABNORMAL HIGH (ref 43–77)

## 2010-08-06 LAB — POCT I-STAT, CHEM 8
Chloride: 102 mEq/L (ref 96–112)
Creatinine, Ser: 1.1 mg/dL (ref 0.4–1.5)
Glucose, Bld: 105 mg/dL — ABNORMAL HIGH (ref 70–99)
HCT: 39 % (ref 39.0–52.0)
Potassium: 3.9 mEq/L (ref 3.5–5.1)

## 2010-08-06 LAB — CBC
MCHC: 34.5 g/dL (ref 30.0–36.0)
RBC: 4.08 MIL/uL — ABNORMAL LOW (ref 4.22–5.81)
WBC: 3.2 10*3/uL — ABNORMAL LOW (ref 4.0–10.5)

## 2010-08-06 LAB — LACTIC ACID, PLASMA: Lactic Acid, Venous: 1 mmol/L (ref 0.5–2.2)

## 2010-08-06 LAB — LIPASE, BLOOD: Lipase: 13 U/L (ref 11–59)

## 2010-08-08 LAB — GLUCOSE, CAPILLARY: Glucose-Capillary: 117 mg/dL — ABNORMAL HIGH (ref 70–99)

## 2010-08-09 LAB — GLUCOSE, CAPILLARY
Glucose-Capillary: 120 mg/dL — ABNORMAL HIGH (ref 70–99)
Glucose-Capillary: 92 mg/dL (ref 70–99)

## 2010-08-14 ENCOUNTER — Ambulatory Visit (HOSPITAL_COMMUNITY)
Admission: RE | Admit: 2010-08-14 | Discharge: 2010-08-14 | Disposition: A | Discharge status: Home / Self Care | Payer: Medicare Other | Source: Ambulatory Visit | Attending: Surgery | Admitting: Surgery

## 2010-08-14 ENCOUNTER — Other Ambulatory Visit: Payer: Self-pay | Admitting: Surgery

## 2010-08-14 DIAGNOSIS — C801 Malignant (primary) neoplasm, unspecified: Secondary | ICD-10-CM | POA: Insufficient documentation

## 2010-08-14 DIAGNOSIS — Z79899 Other long term (current) drug therapy: Secondary | ICD-10-CM | POA: Insufficient documentation

## 2010-08-14 DIAGNOSIS — Z8673 Personal history of transient ischemic attack (TIA), and cerebral infarction without residual deficits: Secondary | ICD-10-CM | POA: Insufficient documentation

## 2010-08-14 DIAGNOSIS — E785 Hyperlipidemia, unspecified: Secondary | ICD-10-CM | POA: Insufficient documentation

## 2010-08-14 DIAGNOSIS — I10 Essential (primary) hypertension: Secondary | ICD-10-CM | POA: Insufficient documentation

## 2010-08-14 DIAGNOSIS — C787 Secondary malignant neoplasm of liver and intrahepatic bile duct: Secondary | ICD-10-CM | POA: Insufficient documentation

## 2010-08-15 NOTE — Op Note (Signed)
  NAME:  Donald [name], Donald [name] NO.:  [id number]  MEDICAL RECORD NO.:  [id number]           PATIENT TYPE:  O  LOCATION:  DAYL                         FACILITY:  [facility]  PHYSICIAN:  Almond Lint, MD       DATE OF BIRTH:  11-11-1945  DATE OF PROCEDURE:  08/14/2010 DATE OF DISCHARGE:                              OPERATIVE REPORT   PREOPERATIVE DIAGNOSIS:  Liver mass.  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  PROCEDURE:  Liver ultrasound.  SURGEON:  Almond Lint, MD.  ASSISTANT:  Dr. Luisa Hart.  ANESTHESIA:  General with local.  FINDINGS:  A 3.5 cm mass in segment 8.  SPECIMEN:  Wedge biopsy of right liver and left core needle biopsies were sent to pathology with frozen sections described above.  ESTIMATED BLOOD LOSS:  Minimal.  COMPLICATIONS:  None known.  PROCEDURE:  Ms. Dobosz was identified in the holding area and taken to the operating room where he was placed on operating room table.  General anesthesia was induced.  Dr. Luisa Hart will dictate the diagnostic laparoscopy portion of this procedure.  The laparoscopic ultrasound probe was introduced into the abdomen and a large mass was located at the dome of the liver.  Multiple core-needle  biopsies were obtained of this with a 14-gauge Tru-Cut biopsy and a 15-gauge biopsy gun.  The remainder of the liver was ultrasounded sequentially, evaluating the right and left portal veins, the right middle and left hepatic veins, as well as all segments of the liver sequentially.  The right kidney was visualized and appeared normal.  There were no additional masses seen in the remainder of the liver.  There was no aberrant vascular anatomy seen. The liver was hemostatic and then Dr. Luisa Hart proceeded to close the abdomen.  The patient was awakened from anesthesia and taken to PACU in stable condition.  Needle, sponge, and instrument counts were  correct.     Almond Lint, MD     FB/MEDQ  D:  08/14/2010  T:  08/14/2010  Job:  [id number]  Electronically Signed by Almond Lint MD on 08/15/2010 09:50:33 PM

## 2010-08-16 ENCOUNTER — Other Ambulatory Visit: Payer: Self-pay

## 2010-08-18 NOTE — Op Note (Signed)
NAME:  Donald [name], Donald [name] NO.:  [id number]  MEDICAL RECORD NO.:  [id number]           PATIENT TYPE:  O  LOCATION:  DAYL                         FACILITY:  [facility]  PHYSICIAN:  Rodnesha Elie A. Taronda Comacho, M.D.DATE OF BIRTH:  07/09/1945  DATE OF PROCEDURE:  08/14/2010 DATE OF DISCHARGE:                              OPERATIVE REPORT   PREOPERATIVE DIAGNOSIS:  3-cm mass at dome of liver, segment 8.  POSTOPERATIVE DIAGNOSIS:  3-cm mass at dome of liver, segment 8.  PROCEDURE:  Laparoscopic-assisted core biopsy of liver mass using both 14-gauge Tru-Cut needle and 15-gauge biopsy Springer gun.  SURGEON:  Maisie Fus A. Taos Tapp, MD  ASSISTANT:  Almond Lint, MD  ANESTHESIA:  General endotracheal anesthesia with 0.25% Sensorcaine.  ESTIMATED BLOOD LOSS:  Minimal.  SPECIMEN:  Core biopsy shown to be poorly-differentiated carcinoma involving the right lobe of the liver.  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  He has done well, but on routine followup by CT scan, a 3-cm mass was noted in the dome of the right liver involving segment 8.  Interventional Radiology attempted to biopsy this but could not reach it for fear of function of the lung.  He was sent to me by Dr. Arlan Organ for evaluation of laparoscopic- assisted core biopsy.  I reviewed her scans and also reviewed these with Dr. Almond Lint, surgical oncologist.  We felt that an ultrasound- guided approach intraoperatively with a Tru-Cut or core biopsy gun would be the best way to biopsy this as a joint effort.  She reviewed the scans with me and we talked to patient about this and he agreed to proceed in order to further define what this lesion was.  He had a PET scan, and this lesion was hot on PET scan as well.  Risks, benefits, and alternative therapies were discussed.   Risk of bleeding, infection, need for open surgery, injury to neighboring structures and veins, bile leak, biloma, injury to major intrahepatic vascular structures, vena cava, portal vein, hepatic arteries requiring open surgery were discussed.  He understood the potential risk of this procedure and agreed to proceed.  DESCRIPTION OF PROCEDURE:  The patient was brought to the operating room.  After induction of general anesthesia, Foley catheter was placed. Left arm was tucked and the abdomen was prepped and draped in sterile fashion.  Time-out was done and antibiotics were given in a timely fashion.  1 cm supraumbilical incision was made.  Dissection was carried down to his fascia.  His fascia was opened in midline.  We entered the abdominal cavity easily with a hemostat.  Pursestring suture of 0 Vicryl was placed.  12-mm Hasson cannula was placed under direct vision. Pneumoperitoneum was created at 15 mmHg.  CO2 and laparoscope was placed.  Four-quadrant laparoscopy was done and no obvious abnormality was noted.  He was placed in reverse Trendelenburg.  Liver was examined. There was a very small 5 mm punctate lesion just to the right of the gallbladder.  We placed a 12-mm port  in the right subcostal region.  I placed a 5-mm port in subxiphoid position.  We did a biopsy of this small lesion and it was nondiagnostic.  An intraoperative ultrasound was done by Dr. Almond Lint, and she dictated this as a separate part of procedure.  Please see her operative for all the details of the intraoperative ultrasound.  Once she was able to localize the lesion with the ultrasound probe, it was in segment 8, it was about 3 cm.  We then under direct vision made a small puncture wound just below the xiphoid process and advanced a 14-gauge Tru-Cut needle toward the lesion.  Ultrasound-guided entry was made to pass this but could not reach the lesion.  We then obtained a 15-gauge springer biopsy gun  which was approximately 3 cm longer.  We then reinserted this under ultrasound guidance and placed the needle into the lesion, fired with a gun without difficulty and removed a very nice core specimen.  This was sent for frozen section, was found be poorly differentiated carcinoma of this lesion.  Ultrasound report again was done by Dr. Donell Beers, and all details of this note.  After doing the biopsy, I examined the liver edge.  We made sure that both biopsy sites were hemostatic with cautery and a small piece of Surgicel was placed on both.  Irrigation was used.  We observed the liver for about 10 to 15 minutes and I saw no evidence of bleeding.  Ultrasound was done of the entire liver which is in Dr. Arita Miss report.  Did not see any evidence of hematoma or other problem at the biopsy sites.  After irrigation, we did a four-quadrant laparoscopy and saw no signs of carcinomatosis or other abnormality.  At this point in time, we removed the most lateral port, which was a 12-mm port to the ultrasound probe and closed it with a single stitch of 0 Vicryl.  We then made sure irrigation was suctioned out, hemostasis was achieved, and removed remainder of the ports on the CO2 to escape, closing the umbilical port site with a pursestring suture of 0 Vicryl already in place.  Dermabond was applied.  All final counts, sponge, needle and instruments were found to be correct.  The patient awoke, extubated, taken to recovery in satisfactory condition.     Anilah Huck A. Aracelie Addis, M.D.     TAC/MEDQ  D:  08/14/2010  T:  08/14/2010  Job:  [id number]  cc:   Vicente Serene I. Odogwu, M.D. Fax: [phone]  Electronically Signed by Harriette Bouillon M.D. on 08/18/2010 01:13:35 PM

## 2010-08-29 HISTORY — PX: LIVER CRYOABLATION: SHX1974

## 2010-08-31 ENCOUNTER — Encounter (HOSPITAL_BASED_OUTPATIENT_CLINIC_OR_DEPARTMENT_OTHER): Payer: Medicare Other | Admitting: Hematology and Oncology

## 2010-08-31 ENCOUNTER — Other Ambulatory Visit (INDEPENDENT_AMBULATORY_CARE_PROVIDER_SITE_OTHER): Payer: Medicare Other

## 2010-08-31 DIAGNOSIS — Z48812 Encounter for surgical aftercare following surgery on the circulatory system: Secondary | ICD-10-CM

## 2010-08-31 DIAGNOSIS — C210 Malignant neoplasm of anus, unspecified: Secondary | ICD-10-CM

## 2010-08-31 DIAGNOSIS — I6529 Occlusion and stenosis of unspecified carotid artery: Secondary | ICD-10-CM

## 2010-09-08 ENCOUNTER — Encounter (HOSPITAL_COMMUNITY)
Admission: RE | Admit: 2010-09-08 | Discharge: 2010-09-08 | Disposition: A | Discharge status: Home / Self Care | Payer: Medicare Other | Source: Ambulatory Visit | Attending: Surgery | Admitting: Surgery

## 2010-09-08 LAB — CBC
MCHC: 34.4 g/dL (ref 30.0–36.0)
MCV: 93.3 fL (ref 78.0–100.0)
Platelets: 243 10*3/uL (ref 150–400)
RDW: 13 % (ref 11.5–15.5)
WBC: 5.9 10*3/uL (ref 4.0–10.5)

## 2010-09-08 LAB — BASIC METABOLIC PANEL
Calcium: 10.2 mg/dL (ref 8.4–10.5)
GFR calc Af Amer: >60 mL/min (ref >60)
GFR calc non Af Amer: >60 mL/min (ref >60)
Glucose, Bld: 147 mg/dL — ABNORMAL HIGH (ref 70–99)
Potassium: 4.3 mEq/L (ref 3.5–5.1)
Sodium: 140 mEq/L (ref 135–145)

## 2010-09-08 LAB — SURGICAL PCR SCREEN
MRSA, PCR: NEGATIVE
Staphylococcus aureus: NEGATIVE

## 2010-09-08 LAB — HEPATIC FUNCTION PANEL
AST: 49 U/L — ABNORMAL HIGH (ref 0–37)
Bilirubin, Direct: <0.1 mg/dL (ref 0.0–0.3)

## 2010-09-12 NOTE — H&P (Signed)
HISTORY AND PHYSICAL EXAMINATION   November 08, 2009   Re:  Donald [name], REFFNER                  DOB:  06-16-1945   Date of the last surgery was a right carotid endarterectomy on  11/12/2006 by Dr. Madilyn Fireman.   Patient is a very pleasant 65 year old gentleman followed by Dr. Mirna Mires.  He has previously undergone surgery for peripheral vascular  disease, consisting of a right carotid endarterectomy by Dr. Madilyn Fireman in  2008 which was performed for a symptomatic right internal carotid artery  stenosis.  At that time, he did have a stroke which left him with a  monoparesis of his left upper extremity.  He was last seen in clinic by  Della Goo and was found to have no palpable DP or PT pulses as per  Doppler.  Both feet were warm and pink and had good capillary refill.  Rene Kocher recommended that he be seen in the next several months in order  to determine the extent of his disease.  He returns today for  evaluation.   PAST MEDICAL HISTORY:  Consists of diabetes, non-insulin dependent,  hypertension, hypercholesterolemia, colorectal cancer with repair in  2009 and carotid endarterectomies.   FAMILY HISTORY:  Noncontributory.   SOCIAL HISTORY:  He is married.  He currently smokes 2 packs of  cigarettes per day.  He does not partake of alcoholic substances.   Review of systems was significant for pain in his legs with walking and  pain in his feet with lying flat.  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   ALLERGIES:  None.   CURRENT MEDICATIONS:  Aggrenox 25 mg 1 tablet p.o. b.i.d., metformin 500  mg p.o. daily, Lovaza 900 mg 2 tablets p.o. b.i.d., omeprazole 40 mg  p.o. b.i.d., lisinopril 5 mg p.o. daily, and stool softener q.i.d.   Physical findings revealed a very pleasant elderly gentleman in no  apparent distress.  He appeared well-nourished.  Heart rate was 96,  blood pressure 121/74, O2 saturation 96%.  HEENT: PERRLA.  EOMI.  Lungs  were clear.   Cardiac exam revealed a regular rate and rhythm.  The  abdomen was soft, nontender.  Musculoskeletal exam demonstrated no major  deformities.  Neurological exam:  He did have a left upper extremity  monoparesis.  There were no ulcers or rashes on his legs.   LABORATORY RESULTS:  ABIs demonstrated on the right was 0.58, on the  left 0.45.  The indices suggested moderate to severe disease with the  left leg being worse than the right.   In discussion with patient, he did desire further evaluation of his  disease process.  We will obtain an angiogram in the next week or 2 by  Dr. Edilia Bo on Monday 11/14/2009.  Following which he will be then  evaluated for further procedures.   Wilmon Arms, PA   Quita Skye Hart Rochester, M.D.  Electronically Signed   KEL/MEDQ  D:  11/08/2009  T:  11/08/2009  Job:  [id number]

## 2010-09-12 NOTE — Discharge Summary (Signed)
NAME:  Donald [name], Donald [name] NO.:  [id number]   MEDICAL RECORD NO.:  [id number]          PATIENT TYPE:  REC   LOCATION:  TPC                          FACILITY:  [facility]   PHYSICIAN:  Balinda Quails, M.D.    DATE OF BIRTH:  1946-01-14   DATE OF ADMISSION:  11/12/2006  DATE OF DISCHARGE:  11/13/2006                               DISCHARGE SUMMARY   PHYSICIAN:  Dr. Denman George   ADMISSION DIAGNOSES:  Severe right internal carotid artery stenosis with  recent history of right hemispheric CVA.   SECONDARY DIAGNOSES:  1. Severe right internal carotid artery stenosis status post recent      right hemispheric CVA, status post right carotid endarterectomy.  2. Hypertension.  3. Hyperlipidemia.  4. Tobacco abuse.  5. Diabetes mellitus type 2.  6. Right middle cerebral artery infarction.   ALLERGIES:  MUSCLE RELAXANT.   PROCEDURES:  On November 12, 2006, right carotid endarterectomy with Dacron  patch angioplasty by Dr. Denman George.   BRIEF HISTORY:  Donald [name] is a 65 year old Caucasian male who was  initially admitted to [facility] on Sep 12, 2006, following the  onset of left sided weakness and slurred speech.  An MRI at that time  revealed right middle cerebral artery infarction.  No TPA was initiated.  Carotid duplex revealed severe right internal carotid artery stenosis  greater than 80%.  He was seen in consultation by the vascular surgery  service and right carotid endarterectomy was recommended but for several  weeks following initial stroke presentation.  In the meantime, the  patient underwent inpatient rehabilitation and was now ambulatory with a  cane.  He continued to have monoparesis in the left upper extremity.   HOSPITAL COURSE:  Donald [name] was electively admitted to [facility] November 12, 2006, and underwent right carotid endarterectomy.  He  was extubated neurologically at his baseline, had a short stay in  recovery unit and was  transferred to step-down unit 3300 where he  remained until discharge.  He remained at his baseline through this  hospitalization.  He continued to show evidence of some left facial  droop and left upper extremity paresis.  His tongue was midline.  He was  denied dysphagia.  Speech was also slightly slurred which was also at  his baseline.  He remained alert and oriented.  He remained  hemodynamically stable and vitals showed blood pressure 124/45, heart  rate around 60 in sinus rhythm, oxygen saturation 92% on room air.  He  was afebrile.  Blood sugar was stable in the 110s.  His pain was  controlled, and physical exam showed his heart was regular rate and  rhythm.  Incision was clean, dry, intact without evidence of hematoma,  but with some soft tissue swelling at the base of neck.   LABORATORY DATA:  Labs were stable showing white blood count 8.9,  hemoglobin 13.1, hematocrit 38.7, platelet count 268.  Sodium 139,  potassium 4.0, BUN 11, creatinine 1.02 and blood glucose 100.   DISPOSITION:  Mr.  [name] was ultimately felt appropriate for  discharge home.  See  admit discharge criteria on postop day #1 November 13, 2006.  He is  discharged home in stable condition.   DISCHARGE MEDICATIONS:  1. Aspirin 325 mg p.o. daily.  2. Tricor 145 mg p.o. daily.  3. Glucophage 500 mg p.o. daily.  4. Nicoderm patch 7 mg transdermal daily.  5. Neurontin 300 mg p.o. daily.  6. Pepcid 20 mg p.o. b.i.d.  7. Stool softener 2 tablets p.o. b.i.d.  8. Tylox 1-2 tablets p.o. q.4 h p.r.n. pain.   DISCHARGE INSTRUCTIONS:  1. He is to continue diabetic appropriate diet.  2 . Activity:  Increase activity slowly.  He may shower in a couple of  days and to avoid driving or heavy lifting for the next 2 weeks.  1. Wound care:  Incision should be cleaned gently with soap and water,      and he should call if he develops fever greater than 101 or redness      or drainage to his incision site.   FOLLOWUP:  He  is to see Dr. Madilyn Fireman at [facility] office in  approximately 2 weeks and should call sooner if needed.  Our office will  contact him regarding specific appointment date and time.      Donald [name], P.A.      Balinda Quails, M.D.  Electronically Signed    AWZ/MEDQ  D:  11/15/2006  T:  11/16/2006  Job:  [id number]   cc:   Balinda Quails, M.D.  Annia Friendly. Loleta Chance, MD  Pramod P. Pearlean Brownie, MD  Ellwood Dense, M.D.

## 2010-09-12 NOTE — Assessment & Plan Note (Signed)
REASON FOR VISIT:  The patient returns to the clinic today for followup  evaluation.   HISTORY OF PRESENT ILLNESS:  He is a 65 year old, right handed male who  suffered a right middle cerebral artery distribution infarct Sep 12, 2006.  He still has significant left-sided weakness especially involving  his left arm.  His therapy is on hold at this time as they are looking  into see if he qualifies for an electrical stimulation unit at home.  He  is up ambulating with a single-point cane.  He does wear a splint on his  left upper extremity.  His blood sugars have been in the 111 to 150  range.  He continues to be followed by Dr. Mirna Mires, his primary care  physician.  He has recently been switched from aspirin to Aggrenox twice  a day.  He plans to see Dr. Madilyn Fireman in followup after his recent right  carotid endarterectomy.  He reports only mild pain of his left upper  extremity.  He is smoking approximately 2 cigarettes per day and is  using Nicorette gum but not using any patch at this point.  He has also  been placed on Nexium for reflux symptoms.   REVIEW OF SYSTEMS:  Noncontributory.   MEDICATIONS:  1. Aggrenox 1 capsule b.i.d..  2. Tricor 145 mg.  3. Nicorette gum daily.  4. Nexium daily.  5. Glucophage 500 mg daily.  6. Lidoderm patches p.r.n.   PHYSICAL EXAMINATION:  GENERAL APPEARANCE: Reasonably well-appearing,  middle-aged adult male in mild acute discomfort.  VITAL SIGNS:  Blood pressure 127/71 with a pulse of 82, respiratory rate  18 and 02 saturation 97% on room air.  He has 4-/5 strength in the left  lower extremity and 3 to 3+/5 strength in the left upper extremity with  poor coordinated movements of his left hand.  Right sided strength was  all 5/5.   IMPRESSION:  1. Status post right middle cerebral artery infarct with left      hemiparesis, arm greater than leg.  2. Status post right carotid endarterectomy by Dr. Madilyn Fireman.  3. Chronic constipation.  4.  Dyslipidemia.  5. Noninsulin dependent diabetes mellitus.   In the office today no refill on medication is necessary.  At this point  he has almost completed outpatient therapies and they are looking into  an electrical stimulation machine that he could possibly use at home if  his insurance approves.  Will plan on seeing the patient in followup in  this office on an as-needed basis.  He will be following up with Dr.  Mirna Mires, his primary care physician.           ______________________________  Ellwood Dense, M.D.     DC/MedQ  D:  02/06/2007 10:59:01  T:  02/06/2007 15:46:42  Job #:  [id number]

## 2010-09-12 NOTE — Procedures (Signed)
CAROTID DUPLEX EXAM   INDICATION:  Followup carotid artery disease.   HISTORY:  Diabetes:  Yes  Cardiac:  No  Hypertension:  No  Smoking:  Yes  Previous Surgery:  Right CEA November 02, 2006, by Dr. Madilyn Fireman  CV History:  Sep 12, 2006, CVA, residual left arm weakness and numbness  Amaurosis Fugax No, Paresthesias Yes, Hemiparesis Yes                                       RIGHT             LEFT  Brachial systolic pressure:         138               142  Brachial Doppler waveforms:         Triphasic         Triphasic  Vertebral direction of flow:        Antegrade         Antegrade  DUPLEX VELOCITIES (cm/sec)  CCA peak systolic                   66                99  ECA peak systolic                   209               110  ICA peak systolic                   88                95  ICA end diastolic                   30                28  PLAQUE MORPHOLOGY:                  Mixed             Mixed  PLAQUE AMOUNT:                      Moderate in ECA   Mild  PLAQUE LOCATION:                    ECA               ICA   IMPRESSION:  Right ICA shows no evidence of restenosis status post ECA  Right ECA stenosis  Left ICA stenosis of 20-39%   ___________________________________________  P. Liliane Bade, M.D.   AS/MEDQ  D:  02/27/2007  T:  02/28/2007  Job:  [id number]

## 2010-09-12 NOTE — Consult Note (Signed)
NAME:  Donald [name] NO.:  [id number]   MEDICAL RECORD NO.:  [id number]          PATIENT TYPE:  INP   LOCATION:  3014                         FACILITY:  [facility]   PHYSICIAN:  Balinda Quails, M.D.    DATE OF BIRTH:  02-25-1946   DATE OF CONSULTATION:  09/15/2006  DATE OF DISCHARGE:                                 CONSULTATION   CONSULTING PHYSICIAN:  Balinda Quails MD   REASON FOR CONSULTATION:  1. Right hemispheric cerebrovascular accident.  2. Severe right internal carotid artery stenosis.   HISTORY:  Donald [name] is a 65 year old male admitted to [facility] on July 13, 2006 following the onset of left-sided  weakness and slurred speech.  He has a history of poor compliance with  hypertension and chronic tobacco abuse.  He was well until the evening  prior to admission, awoke in the morning and was able to go down two  flights of stairs, get and newspaper and was making coffee with his  right hand and arm.  He noted while drinking coffee that it was drooling  out of the left side of this mouth.  He spoke at that time, and his wife  noted significant dysarthria.  The patient was brought in as a code  stroke.  His history for risk factors includes tobacco use and  hypertension.   PAST MEDICAL HISTORY:  1. Hypertension.  2. Tobacco abuse.   MEDICATIONS:  1. Aspirin 325 mg daily.  2. Lovenox 40 mg subcutaneous daily.  3. Tricor 145 mg daily.  4. Morphine 4 mg IV p.r.n.  5. Nicotine patch 21 mg daily.  6. Lyrica 75 mg daily.  7. Tylenol 650 mg q.4-6h. p.r.n.  8. Vicodin 1 tablet q.3h. p.r.n.  9. Thick-It instant food thickener with meals p.r.n.   ALLERGIES:  MUSCLE RELAXANTS.   SOCIAL HISTORY:  The patient has a high school education.  He worked at  [employer] and retired in 2004.  He has one alcoholic members daily.  Smokes one pack of cigarettes daily.   FAMILY HISTORY:  His mother died at age 44 of complications of diabetes.  Father's history is unknown.  He has brothers 48 and 42 who are living  and generally well.  No children of his own.   PHYSICAL EXAMINATION:  GENERAL:  Well-developed 65 year old male.  Slurred speech is obvious.  Complaining of a headache.  VITAL SIGNS:  Blood pressure is 150/79, pulse is 69 per minute,  respirations 18 per minute, temperature 97.8, O2 saturation 98% on 2  liters nasal cannula.  NECK:  Supple.  No thyromegaly or adenopathy.  NEUROLOGIC:  The patient has slurred speech.  He responds to questions  appropriately.  He has 1/5 strength in his left upper extremity; 3/5  strength in his left lower extremity.  Some drooping of the left side of  his face.  CARDIOVASCULAR:  No carotid bruits.  Heart sounds are normal without  murmurs.  CHEST:  Air entry equal bilaterally without rales or rhonchi.  ABDOMEN:  No masses or organomegaly.  Nontender.   INVESTIGATIONS:  The patient has undergone MRI of the brain which  reveals a right middle cerebral artery distribution infarction in the  right temporal lobe.   CT of the head carried out following admission reveals a right MCA  infarct without hemorrhage.   Carotid Doppler evaluation carried out following admission reveals a  severe right internal carotid artery stenosis greater than 80%.  No  significant left ICA stenosis.   IMPRESSION:  1. Right middle cerebral artery distribution infarction with left      hemiparesis.  2. Severe right internal carotid artery stenosis.  3. Hypertension.  4. Tobacco abuse.  5. Poor medical compliance.   RECOMMENDATIONS:  Continue rehabilitation with physical therapy and  occupational therapy.   FOLLOWUP:  Planned follow up in the office in approximately 4 weeks for  staged right carotid endarterectomy.      Balinda Quails, M.D.  Electronically Signed     PGH/MEDQ  D:  09/15/2006  T:  09/16/2006  Job:  [id number]

## 2010-09-12 NOTE — Discharge Summary (Signed)
NAME:  Donald [name], Donald [name] NO.:  [id number]   MEDICAL RECORD NO.:  [id number]          [name] TYPE:  IPS   LOCATION:  4025                         FACILITY:  [facility]   PHYSICIAN:  Ellwood Dense, M.D.   DATE OF BIRTH:  1946-03-26   DATE OF ADMISSION:  09/16/2006  DATE OF DISCHARGE:  10/02/2006                               DISCHARGE SUMMARY   DISCHARGE DIAGNOSES:  1. Right middle cerebral artery infarction.  2. Dysphagia.  3. Right internal carotid artery stenosis.  4. Hyperlipidemia.  5. Tobacco abuse.  6. Diabetes mellitus.   This is a 65 year old right-handed white male admitted May 15 with  history of untreated hypertension, tobacco abuse who presented with left-  sided weakness and slurred speech.  MRI showed a right middle cerebral  artery infarction.  MRA with abrupt occlusion of Donald right middle  cerebral artery.  No tPA was initiated.  Carotid Dopplers with right  internal carotid artery stenosis of greater than 80%.  Cholesterol 243.  Echocardiogram with ejection fraction 60-65% without embolism.  Cardiothoracic surgery plan for carotid surgery in 2-3 weeks.  Modified  barium swallow May 16.  Placed on a dysphagia-I nectar-thick liquid  diet.  Neurology consulted Dr. Pearlean Brownie.  Placed on Aggrenox.  Subcutaneous  Lovenox was initiated for deep vein thrombosis prophylaxis.  Noted  hemoglobin A1c of 9.1.  Check of blood sugars was yet to be ordered.  Donald [name] was admitted for comprehensive rehab program.   PAST MEDICAL HISTORY:  See discharge diagnoses.  He smokes approximately  2 packs per day and occasional alcohol.   ALLERGIES:  MUSCLE RELAXANTS.   SOCIAL HISTORY:  Lives with his wife and assistance as needed.  He is  retired as of 2004.  Lives in a 2-level home, bedroom downstairs, 1 step  to entry.   MEDICATION PRIOR TO ADMISSION:  None.   REHABILITATION HOSPITAL COURSE:  Donald [name] was admitted to inpatient  rehab services with therapies  initiated on a 3-hour daily basis  consisting of physical therapy, occupational therapy, speech therapy and  rehabilitation nursing.  Donald following issues were addressed during Donald  [name]'s rehabilitation stay.  Pertaining to Donald [name]'s right  middle cerebral artery infarction:  Maintained on Aggrenox therapy.  He  was minimal assist for lower body dressing, for activities of daily  living.  Supervision wheelchair level.  Simple set up for upper body,  activities of daily living.  He exhibited no unsafe behavior.  He did  have some very mild slurred speech.  His diet was slowly advanced to  mechanical soft.  He remained on subcutaneous Lovenox throughout his  rehab course for deep vein thrombosis prophylaxis.  Noted during workup  of cerebrovascular accident with right internal carotid artery stenosis  of greater than 80% with plan to see Dr. Madilyn Fireman for carotid surgery in  Donald upcoming at 2-3 weeks.  He had been placed on Tricor for  hyperlipidemia for cholesterol level of 243.  He did have a history of  tobacco abuse.  He was weaned from a Nicoderm patch.  Noted findings of  elevated hemoglobin A1c  of 9.1.  Blood sugars 106, 111, 122, 124.  He  had been placed on Glucophage 500 mg daily and monitored.   Latest labs showed a sodium 139, potassium 4.1, BUN 13, creatinine 0.8,  hemoglobin 54, hematocrit 44.9, platelet 263,000.  WBC of 9.6.   DISCHARGE MEDICATIONS AT TIME OF DICTATION:  Included:  1. Aggrenox 1 capsule twice daily.  2. Tricor 145 mg daily.  3. Nicoderm patch 14 mg daily and weaned appropriately.  4. Glucophage 500 mg daily.  5. Lidoderm patch 5% change every 24 hours.  6. Neurontin 300 mg at bedtime.   ACTIVITY:  Activity as tolerated.  Advised no driving, no smoking, no  alcohol.   DIET:  Mechanical soft.   SPECIAL INSTRUCTIONS:  Follow up Dr. Ellwood Dense at Donald outpatient  rehab service office.  Dr. Madilyn Fireman, cardiothoracic surgery, for plan for  right  carotid surgery.  Dr. Pearlean Brownie, neurology services.      Mariam Dollar, P.A.    ______________________________  Ellwood Dense, M.D.    DA/MEDQ  D:  10/01/2006  T:  10/01/2006  Job:  [id number]   cc:   Balinda Quails, M.D.  Pramod P. Pearlean Brownie, MD

## 2010-09-12 NOTE — Assessment & Plan Note (Signed)
OFFICE VISIT   Donald [name], Donald [name]  DOB:  [birth year]                                       12/12/2006  CHART#:[id number]   The patient is status post right carotid endarterectomy carried out for  severe right internal carotid artery stenosis associated with a right  hemispheric CVA.  This operative procedure November 12, 2006.  He did well  following surgery, discharged home in good condition with residual  deficits per his preoperative stroke.   His blood pressure is 142/72, pulse 91 per minute, regular respirations  18 per minute.  Right neck incision healing unremarkably.  He is at  baseline neurologically.  The patient has done quite well following his  surgery.  He is now back on his Aggrenox.  I will plan to see him back  in the office hear with a carotid Doppler evaluation.   Balinda Quails, M.D.  Electronically Signed   PGH/MEDQ  D:  12/12/2006  T:  12/13/2006  Job:  [id number]   cc:   Annia Friendly. Loleta Chance, MD  Pramod P. Pearlean Brownie, MD  Ellwood Dense, M.D.

## 2010-09-12 NOTE — Assessment & Plan Note (Signed)
OFFICE VISIT   Donald [name], Donald [name]  DOB:  [birth year]                                       02/27/2007  CHART#:[id number]   HISTORY:  The patient returned to the office today for continued  followup of his carotid disease.  He underwent a right carotid  endarterectomy 11/12/2006.  This was an uneventful operative procedure.  He suffered a preoperative right hemispheric CVA with resultant left  hemiparesis.  His current medications include Aggrenox, TriCor,  metformin, Nexium, Senokot and oxycodone.   He has had no chest pain or shortness of breath.  Denies further  neurologic symptoms.  He is in no acute distress.  Alert and oriented.  He has a left hemiparesis.  He ambulates with the assistance of a cane.  BP is 119/77, pulse 64 per minute and regular, respirations 18 per  minute, O2 saturation 97%.  Chest is clear.  Heart sounds are normal.  No carotid bruits.  Left neck incision well healed.   The patient has a chronic left hemiparesis.  No evidence of further  neurologic events.  Carotid Doppler evaluation reveals a widely patent  right carotid endarterectomy site.  Mild left internal carotid artery  stenosis at 20-39%.   We will plan followup per routine carotid protocol and rescan in 6  months.   Balinda Quails, M.D.  Electronically Signed   PGH/MEDQ  D:  02/27/2007  T:  02/28/2007  Job:  [id number]   cc:   Pramod P. Pearlean Brownie, MD  Annia Friendly. Loleta Chance, MD  Ellwood Dense, M.D.

## 2010-09-12 NOTE — Discharge Summary (Signed)
NAME:  Donald [name], Donald [name]            ACCOUNT NO.:  [id number]   MEDICAL RECORD NO.:  [id number]          PATIENT TYPE:  INP   LOCATION:  3014                         FACILITY:  [facility]   PHYSICIAN:  Pramod P. Pearlean Brownie, MD    DATE OF BIRTH:  [birth year]   DATE OF ADMISSION:  09/12/2006  DATE OF DISCHARGE:  09/16/2006                               DISCHARGE SUMMARY   DISCHARGE DIAGNOSES:  1. Right temporal middle cerebral artery branch infarct secondary to      right internal carotid artery stenosis requiring surgery in 2-4      weeks.  2. Hypertriglyceridemia.  3. Cigarette smoker.  4. New diagnosis of diabetes.   DISCHARGE MEDICATIONS:  1. Aspirin 81 mg a day x2 weeks, then discontinue.  2. Aggrenox 1 p.o. q.h.s. x2 weeks, then increase to b.i.d.  3. Nicotine patch 21 mg a day.  4. TriCor 145 mg a day.  5. Lovenox 40 mg subcutaneously a day.  6. Tylenol 650 mg 1 hour before Aggrenox dose x1 week only.   STUDIES PERFORMED.:  1. A CT of the brain on admission shows no acute abnormality with the      exception of possible hyperdense right middle cerebral artery, but      this is a soft call at best.  Mild changes of small vessel disease      of the white matter.  2. MRI of the brain shows acute right middle cerebral artery infarct      in the right temporal lobe.  There is thrombosis in the right      middle cerebral artery.  3. MRA of the head shows acute abrupt occlusion of the right middle      cerebral artery compatible with thrombus or embolus.  No aneurysm.  4. Followup CT of the brain at 24 hours shows right MCA infarct      without hemorrhage.  5. A 2-D echocardiogram shows EF of 60-65% with no left ventricular      regional wall motion abnormalities.  No cardioembolic source.  6. Carotid Doppler shows right greater than 80% ICA stenosis at      origin, left 40-60% ICA stenosis lower end of range in the mid to      distal lesion region.  Vertebral artery flow antegrade  bilaterally.  7. EKG shows normal sinus rhythm, nonspecific ST and T wave      abnormality.   LABORATORY STUDIES:  CBC with hemoglobin 15.8, white blood cells 11.3.  Chemistry with glucose 166, otherwise normal.  Coags normal. Liver  function tests with AST 40, otherwise normal.  Troponin 0.03, CK 277, CK-  MB 7.6.  Cholesterol 167, triglycerides 243, HDL 25 and LDL 93  Urinalysis is negative.  Homocysteine is 8.0.  Urine drug screen is  negative.  Hemoglobin A1c is 9.1.   HISTORY OF PRESENT ILLNESS:  [name] is a 65 year old right-  handed Caucasian male who was admitted to the emergency room with left-  sided weakness, dysarthria and NIH Stroke Scale of 6.  Mr.  Donald [name] has  a history of untreated hypertension  and chronic cigarette use.  He was  well until the night prior to admission at 11:30 p.m. when he went to  bed.  He awoke the next morning, was able to go down 2 flights of  stairs, get the newspaper, was making coffee with his right arm and  hand.  He noted while drinking his coffee that it was drooling out the  left side of his mouth.  He spoke at that time to his wife who noted  significant dysarthria.  They called 9-1-1, and a code stroke was called  en route to [facility] where his NIH Stroke Scale was 6.  He  was admitted to the hospital for further stroke evaluation.  He was not  a TPA candidate secondary to unknown time of onset.   HOSPITAL COURSE:  MRI was positive for acute infarct.  His stroke was  found to be secondary to a severe right ICA stenosis of greater than  80%.  He also has multiple vascular risk factors including a newly  diagnosed diabetes, hypertension, hypertriglyceridemia, and smoking.  He  was placed on Aggrenox for secondary stroke prevention.  He was started  on new TriCor for elevated high triglycerides.  He was placed on a  nicotine patch and advised not to smoke.  He will be started on diabetic  agents after his transfer  to rehab.  Neurologically, the patient had  significant dysarthria and dysphasia.  He was able to tolerate a  dysphagia 1 pureed diet.  Dr. Balinda Quails, from vascular surgery, was  consulted for his right ICA stenosis, and he does need a right carotid  endarterectomy. Due to this size of the stroke, surgery will be delayed  2-4 weeks. In the meanwhile, the patient will be transferred to rehab  for continuation of therapies until he is stable for surgery.   CONDITION ON DISCHARGE:  The patient is alert and oriented x3,  dysarthric, has a right-gaze preference. He can look to the left. He has  left hemineglect. His left upper extremity is 0/5 and is flaccid.  His  left lower extremity is 3/5.  He has no sensory loss.   DISCHARGE/PLAN:  1. Transfer to rehab for continuation of PT, OT and speech therapy.  2. Dysphasia 1 nectar-thick liquid diet.  3. Aggrenox for secondary stroke prevention.  4. New TriCor  5. Needs diabetic medications to be started on rehab.  6. Follow up with Dr. Balinda Quails in 2-4 weeks for right carotid      endarterectomy.  Most ideal would be to do prior to discharge home      after rehab.  7. Follow up with Dr. Delia Heady in 2-3 months.      Donald [name], N.P.    ______________________________  Donald [name]. Pearlean Brownie, MD    SB/MEDQ  D:  09/16/2006  T:  09/16/2006  Job:  [id number]

## 2010-09-12 NOTE — Assessment & Plan Note (Signed)
OFFICE VISIT   Donald [name], Donald [name]  DOB:  April 05, 1946                                       08/11/2009  CHART#:[id number]   Date of surgery was 11/12/2006.  He had a right carotid endarterectomy.   CHIEF COMPLAINT:  Followup right carotid endarterectomy and bilateral  lower extremity decreased pulses.  The surgeon was Dr. Liliane Bade.   HISTORY OF PRESENT ILLNESS:  The patient is a 65 year old gentleman with  a history of hypercholesterolemia who had a stroke in 2008 and had a  right carotid endarterectomy.  He is here today for his followup after  his carotid ultrasound.  The patient denies any symptoms of new symptoms  of stroke, slurred speech or amaurosis fugax.  He does state that he had  radiation and chemo last year in the past year for rectal cancer which  increased his left leg weakness especially but this is now improving.  He denies any claudication in bilateral lower extremities but states  that occasionally his feet are cool and he did not have any pulses that  could be felt.  He states that sometimes he gets some cramping in his  legs at night but no cramping during the day and no cramping while  walking.  He is able to ambulate with a slight limp without assistive  devices.  He denies any ulcers or lesions on his feet.  He denies any  numbness or tingling in bilateral lower extremities.   PAST MEDICAL HISTORY:  1. Significant for hypercholesterolemia, right hemispheric CVA with      left-sided residual weakness, status post right carotid      endarterectomy in 2008 by Dr. Madilyn Fireman.  2. Rectal cancer treated with radiation and chemotherapy.  3. 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  4. Diabetes.  5. Hypertension.   Carotid ultrasound showed no changes from a year ago.  It showed mild  stenosis on the right and left.  He had no evidence of restenosis status  post right carotid endarterectomy and the left ICA stenosis is stable at  20%-39%.   PHYSICAL  EXAM:  This is a well-developed, well-nourished gentleman in no  acute distress.  He holds his left upper extremity in the bent position  and has difficulty with his grip.  He walks with a limp gait on the  left.  His speech is slow but appropriate.  He has weakness of the left  upper extremity greater than the left lower extremity.  The right side  is within normal limits.  In bilateral lower extremities he has palpable  femoral pulses bilaterally.  He has positive PT and DP pulses per  Doppler.  Both feet are warm and pink and he has good capillary refill  but no palpable pulses.   He only has seasonal allergies.   He is on Aggrenox since a stroke.   ASSESSMENT:  1. Status post right carotid endarterectomy 2008 with unchanged      ultrasound findings which are stable and no evidence of restenosis.  2. Peripheral vascular disease fairly asymptomatic with only Doppler      signals in bilateral lower extremities.   PLAN:  Is to have him follow up in 1 year for his ultrasound of carotid.  He will return to clinic in 3 months for ABIs to assess his peripheral  vascular disease  in his lower extremities.   Della Goo, PA-C   [place]. Edilia Bo, M.D.  Electronically Signed   RR/MEDQ  D:  08/11/2009  T:  08/11/2009  Job:  [id number]

## 2010-09-12 NOTE — Assessment & Plan Note (Signed)
OFFICE VISIT   FREDDI, SCHRAGER  DOB:  07/05/1945                                       05/10/2010  CHART#:[id number]   I saw the patient in the office today for continued follow-up of his  peripheral vascular disease.  He originally was a patient of Dr. Madilyn Fireman.  He has had a previous right carotid endarterectomy.  He had had a  preoperative right hemispheric stroke.  I have been following him with  peripheral vascular disease.  He underwent an angiogram in July 2011  which showed some moderate diffuse aortoiliac disease but no focal  disease amenable to angioplasty at that time.  He had bilateral  superficial femoral artery occlusions.  He had stable claudication.  He  comes in now for a 27-month follow-up study.   Since I saw him last, he states he continues to have some claudication  in both legs which has been stable.  He thinks his symptoms may actually  improved slightly.  He has had no rest pain and no history of nonhealing  ulcers.  He has had some issues with gout in his left foot and has had  some injections for this.   He does have a history of rectal cancer and has undergone previous  chemotherapy and radiation therapy for this.  His most recent biopsy was  negative with no evidence of recurrent cancer.   SOCIAL HISTORY:  He is married.  He does not have any children.  He  smokes 2 packs per of cigarettes and has been smoking for as long as he  can remember.   REVIEW OF SYSTEMS:  CARDIOVASCULAR:  He has had no chest pain, chest  pressure, palpitations or arrhythmias.  He has had a previous right  hemispheric stroke.  PULMONARY:  He has had no productive cough, bronchitis, asthma or  wheezing.   PHYSICAL EXAMINATION:  This is a pleasant 65 year old gentleman who  appears his stated age.  Blood pressure 164/88, heart rate is 92,  respiratory rate is 20.  Lungs are clear bilaterally to auscultation.  On cardiovascular exam I do not detect  any carotid bruits.  He has a  regular rate and rhythm.  He has palpable but diminished femoral pulses  bilaterally.  I cannot palpate popliteal or pedal pulses on either side.  Both feet appear adequately perfused without ischemic ulcers.  Abdomen  is soft and nontender with normal-pitched bowel sounds.  Neurologic  exam:  He has significant weakness to the left biceps, triceps, grip and  shoulder.  He has fairly good strength in the left lower extremity.  He  has good strength on the right.  Skin:  There are no ulcers or rashes.   I have independently interpreted his vein mapping, which shows that his  greater saphenous vein on the left is marginal in size.  It does not  appear to be a good conduit for bypass.   Fortunately, his symptoms appear to be gradually improving.  I have  encouraged him to stay as active as possible and also encouraged him to  again consider giving up cigarettes, although he does not seem to be  interested in this.  As long as his symptoms are improving, we will hold  off on any consideration for revascularization.  If the symptoms  progress, he has multilevel disease and  would probably have to be  studied to see if he has had progression of his proximal disease that  might be amenable to angioplasty.  He is not an ideal candidate for a  simple infrainguinal bypass on the left as he has some inflow disease  and also would likely require use of a prosthetic graft.  I will plan on  seeing him back in 6 months.  He knows to call sooner if he has  problems.     Di Kindle. Edilia Bo, M.D.  Electronically Signed   CSD/MEDQ  D:  05/10/2010  T:  05/11/2010  Job:  [id number]

## 2010-09-12 NOTE — Procedures (Signed)
CAROTID DUPLEX EXAM   INDICATION:  Follow-up evaluation of known carotid artery disease.   HISTORY:  Diabetes:  Yes.  Cardiac:  No.  Hypertension:  No.  Smoking:  Yes.  Previous Surgery:  Right carotid endarterectomy on 11/02/06 by Dr.  Madilyn Fireman.  CV History:  Patient had a stroke on 09/12/06.  Patient still has some  residual left arm weakness.  Patient had a previous duplex on 02/27/07  which revealed no right ICA stenosis, status post endarterectomy and a  20-39% left ICA stenosis.  Amaurosis Fugax No, Paresthesias Yes, Hemiparesis Yes                                       RIGHT             LEFT  Brachial systolic pressure:         152               146  Brachial Doppler waveforms:         Triphasic         Triphasic  Vertebral direction of flow:        Antegrade         Antegrade  DUPLEX VELOCITIES (cm/sec)  CCA peak systolic                   73                99  ECA peak systolic                   193               141  ICA peak systolic                   63                92  ICA end diastolic                   15                29  PLAQUE MORPHOLOGY:                  Soft              Soft  PLAQUE AMOUNT:                      Mild-to-moderate  Mild  PLAQUE LOCATION:                    Proximal ECA      Proximal ICA   IMPRESSION:  1. No right internal carotid artery stenosis, status post      endarterectomy.  2. 20-39% left internal carotid artery stenosis.  3. No significant change from previous study performed 02/27/07.   ___________________________________________  P. Liliane Bade, M.D.   MC/MEDQ  D:  08/07/2007  T:  08/07/2007  Job:  [id number]

## 2010-09-12 NOTE — Op Note (Signed)
NAME:  Donald [name], Donald [name] NO.:  [id number]   MEDICAL RECORD NO.:  [id number]          PATIENT TYPE:  INP   LOCATION:  3302                         FACILITY:  [facility]   PHYSICIAN:  Balinda Quails, M.D.    DATE OF BIRTH:  October 18, 1945   DATE OF PROCEDURE:  11/12/2006  DATE OF DISCHARGE:  11/13/2006                               OPERATIVE REPORT   SURGEON:  Denman George, MD   ASSISTANT:  Jerold Coombe, P.A.   ANESTHETIC:  General endotracheal.   PREOPERATIVE DIAGNOSES:  1. Severe right internal carotid artery stenosis.  2. Status post right hemispheric cerebrovascular accident.   POSTOPERATIVE DIAGNOSES:  1. Severe right internal carotid artery stenosis.  2. Status post right hemispheric cerebrovascular accident.   PROCEDURE:  Right carotid endarterectomy,   CLINICAL NOTE:  Donald [name] is a 65 year old male who presented to  [facility] with a right middle cerebral artery embolic stroke.  Workup for this revealed evidence of severe right internal carotid  artery stenosis.  He has undergone extensive rehabilitation, is now able  to ambulate with the assistance of a cane.  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  Brought to the operating at this time for  right carotid endarterectomy.  Potential risks of the operative  procedure explained to the patient and family members.  Major morbidity  mortality 1-2% to include but not limited MI, CVA, cranial nerve injury  and death.   OPERATIVE PROCEDURE:  The patient brought to the operating room stable  hemodynamic condition.  Placed under general endotracheal anesthesia.  Foley catheter arterial line in place.  Right neck prepped and draped in  sterile fashion.   Curvilinear skin incision made along the anterior border the right  sternomastoid muscle.  Subcutaneous tissue and platysma divided.  Deep  dissection carried down to expose the facial vein.  Facial vein ligated  with 3-0 silk  and divided.  The carotid bifurcation exposed.  Common  carotid artery mobilized on the omohyoid muscle, encircled with vessel  loop.  The superior thyroid external carotid were freed and encircled  with vessel loops.  The internal carotid artery then exposed up to the  posterior belly of digastric muscle.  The hypoglossal nerve reflected  superiorly and preserved.  The distal internal carotid artery encircled  with a vessel loop.   Evaluation of carotid bifurcation revealed extensive plaque disease.  This extended from the bulb up into the right internal carotid artery  origin.  The patient administered 7000 units heparin intravenously.  Adequate circulation permitted.   Carotid vessels controlled with clamps.  Longitudinal arteriotomy made  in the distal common carotid artery.  Arteriotomy extended across  carotid bulb and up into the internal carotid artery.  There was no  thrombus present.  There was a severe stenosis of right internal carotid  artery.  Shunt inserted.   The plaque removed with an elevator.  The endarterectomy carried down to  the common carotid artery where the plaque was divided transversely with  Potts scissors.  The superior thyroid and external carotid were  endarterectomized  using an eversion technique.  The distal internal  carotid artery plaque feathered out well.  Fragments of plaque removed  with fine forceps.  Site irrigated with heparin saline solution.   A patch angioplasty of the endarterectomy carried out with a Finesse  Dacron patch using running 6-0 Prolene suture.  At completion of the  patch angioplasty shunt was removed.  All vessels well flushed.  Clamps  removed directing initial antegrade flow up the external carotid artery,  following this internal carotid was released.   Excellent pulse and Doppler signal in the distal internal carotid  artery.  The patient administered 50 mg protamine intravenously.  Adequate hemostasis obtained.   Sponges and instrument counts correct.   The sternomastoid fascia closed with running 2-0 Vicryl suture.  Platysma closed running 3-0 Vicryl suture.  Skin closed with 4-0  Monocryl.  Steri-Strips applied.   The patient tolerated procedure well.  Transferred to recovery in stable  condition.      Balinda Quails, M.D.  Electronically Signed     PGH/MEDQ  D:  11/12/2006  T:  11/13/2006  Job:  [id number]   cc:   Pramod P. Pearlean Brownie, MD

## 2010-09-12 NOTE — Procedures (Signed)
CAROTID DUPLEX EXAM   INDICATION:  Follow up carotid artery disease.   HISTORY:  Diabetes:  Yes.  Cardiac:  No.  Hypertension:  Yes.  Smoking:  Yes.  Previous Surgery:  Right CEA 11/02/2006 by Dr. Madilyn Fireman.  CV History:  Stroke, 09/12/2006.  Patient still has left arm symptoms.  Amaurosis Fugax No, Paresthesias Yes, Hemiparesis Yes.                                       RIGHT             LEFT  Brachial systolic pressure:         140               128  Brachial Doppler waveforms:         WNL               WNL  Vertebral direction of flow:        Antegrade         Antegrade  DUPLEX VELOCITIES (cm/sec)  CCA peak systolic                   98                126  ECA peak systolic                   188               150  ICA peak systolic                   100               107  ICA end diastolic                   35                35  PLAQUE MORPHOLOGY:                  Homogenous        Calcific  PLAQUE AMOUNT:                      Mild              Mild  PLAQUE LOCATION:                    ECA  ICA/ECA/bifurcation   IMPRESSION:  1. Right internal carotid artery shows no evidence of restenosis,      status post carotid endarterectomy.  2. Left internal carotid artery shows evidence of 20% to 39% stenosis.  3. No significant changes from previous studies.   ___________________________________________  Di Kindle. Edilia Bo, M.D.   AS/MEDQ  D:  08/11/2009  T:  08/11/2009  Job:  [id number]

## 2010-09-12 NOTE — H&P (Signed)
NAME:  Donald [name], Donald [name] NO.:  [id number]   MEDICAL RECORD NO.:  [id number]          PATIENT TYPE:  IPS   LOCATION:  NA                           FACILITY:  [facility]   PHYSICIAN:  Ellwood Dense, M.D.   DATE OF BIRTH:  [birth year]   DATE OF ADMISSION:  09/16/2006  DATE OF DISCHARGE:                              HISTORY & PHYSICAL   PRIMARY CARE PHYSICIAN:  None.  CVTS:  Dr. Madilyn Fireman.   NEUROLOGIST:  Dr. Pearlean Brownie   HISTORY OF PRESENT ILLNESS:  Donald [name] is a 65 year old right-handed  Caucasian male admitted Sep 12, 2006, with a history of untreated  hypertension and tobacco abuse.  He presented with acute onset of left-  sided weakness and slurred speech.  MRI studies showed right middle  cerebral artery infarct.  MRA showed an abrupt occlusion of the right  middle cerebral artery.  No tPA was administered.  He had carotid  Dopplers which showed right internal carotid artery stenosis of greater  than 80% with total cholesterol of 243.  Echocardiogram showed an  ejection fraction of 60-65% without evidence of emboli.  CVTS saw the  patient and planned carotid endarterectomy on the right side in 2-3  weeks.  Modified barium swallow Sep 13, 2006, showed that he could  tolerate D1 nectar thick liquids.  Dr. Pearlean Brownie from neurology recommended  Aggrenox for stroke prophylaxis.  Subcutaneous Lovenox was added for DVT  prophylaxis.  Hemoglobin A1c was measured at 9.1.  CBGs a.c. and h.s.  were ordered.   The patient was evaluated by the rehabilitation physicians and felt to  be an appropriate candidate for inpatient rehabilitation.   REVIEW OF SYSTEMS:  Noncontributory.   PAST MEDICAL HISTORY:  Untreated hypertension with no primary care  physician.   FAMILY HISTORY:  Positive for coronary artery disease and diabetes  mellitus.   SOCIAL HISTORY:  The patient lives with his wife who assists him as  needed.  He is retired from [employer].  They live in a 2  level home with a bedroom downstairs and 1 step to enter.  The patient  smokes approximately 2 packs of cigarettes per day and has occasional  alcohol usage.   FUNCTIONAL HISTORY:  Prior to admission:  Independent.   ALLERGIES:  MUSCLE RELAXANTS.   MEDICATIONS PRIOR TO ADMISSION:  None.   LABORATORY:  Recent hemoglobin was 15.8 with hematocrit of 46.3,  platelet count of 250,000, and white count of 11.0.  Recent sodium was  137, potassium 4.5, chloride 103, CO2 28, BUN 11, and creatinine 0.9  with hemoglobin A1c elevated at 9.1.   PHYSICAL EXAMINATION:  GENERAL:  Ill-appearing middle-aged adult male  seated in a recliner with an IV in his left upper extremity.  His left  arm is elevated on a pillow.  He is somewhat groggy but able to answer  questions with a dysarthric speech.  VITAL SIGNS:  Blood pressure 151/82 with pulse of 65, respiratory rate  18, and temperature 97.6.  HEENT:  Normocephalic, nontraumatic.  CARDIOVASCULAR:  Regular rate and rhythm, S1, S2, without murmurs.  ABDOMEN:  Soft,  obese, nontender with positive bowel sounds.  LUNGS:  Clear to auscultation bilaterally.  NEUROLOGIC:  Moderately alert, oriented x3.  Cranial nerves shows facial  droop on the left with probable left hemianopsia and left visual field  neglect.  The patient reports his vision is intact, and extraocular  muscles appear intact.  RIGHT UPPER AND RIGHT LOWER EXTREMITY:  Strength 5/5 throughout.  Bulk  and tone were normal.  Reflexes were 2+ and symmetrical.  Sensation was  intact to light touch throughout the right upper and right lower  extremity.  LEFT UPPER EXTREMITY:  Strength showed 0-1/5 strength with decreased  sensation.  Bulk was normal, and tone was decreased.  Sensation was  decreased to light touch throughout the left upper extremity.  LEFT LOWER EXTREMITY:  Hip flexion and knee extension at 2/5.  Sensation  was decreased to light touch throughout the left lower extremity.   Reflexes were 0-1 in the left lower extremity.  Sensation was decreased  to light touch in the left lower extremity.   IMPRESSION:  Status post right middle cerebral artery infarct with left  hemiplegia along with left facial weakness and probable left neglect.   Presently, the patient has deficits in activities of daily living,  transfers, ambulation, swallowing, and cognition related to the above-  noted right middle cerebral artery infarct.   PLAN:  1. Admit to the rehabilitation unit for physical therapy for range of      motion, strengthening, bed mobility, transfers, pre-gait training,      gait training, and equipment evaluation.  2. Occupational therapy for range of motion, strengthening, activities      of daily living, cognitive/perceptual training, splinting, and      equipment evaluation.  3. Rehab nursing for skin care, wound care, and bowel and bladder      training.  4. Speech therapy for oromotor exercises along with __________ and      dysphagia evaluation.  5. Case management to assess home environment, assist with discharge      planning, and arrange for appropriate followup care.  6. Social worker to assess family and social support, counsel the      patient regarding disability issues and assist in discharge      planning.  7. Continue D1 nectar thick, high carbohydrate modified diet.  8. Check CBGs a.c. and h.s. and use sliding-scale sensitive NovoLog      insulin t.i.d. without HS insulin.  9. Continue aspirin 81 mg p.o. daily x2 weeks then discontinue.  10.Continue Aggrenox 1 p.o. q.h.s. x2 weeks then increase to 1 p.o.      b.i.d.  11.Tylenol 650 mg 1 p.o. 1 hour before Aggrenox for 1 week then stop.  12.Subcu Lovenox 40 mg daily for deep venous thrombosis prophylaxis.  13.Nicoderm patch 21 mcg daily, change daily.  14.Tricor 145 mg p.o. daily.  15.Strict in's and out's. 16.Case management to assist in finding primary care physician close      to his  home.  17.Routine turning to prevent skin breakdown.  18.Discontinue IV fluids if not already done.  19.Old EKG to chart.  20.Tylenol 325 mg 1-2 p.o. q.4h. p.r.n. for pain.   PROGNOSIS:  Fair.   ESTIMATED LENGTH OF STAY:  20-30 days.   GOALS:  Standby assist to min assist for transfers; standby assist for  upper extremity ADLs and min assist for lower extremity ADLs, and min  assist for ambulation.  ______________________________  Ellwood Dense, M.D.     DC/MEDQ  D:  09/16/2006  T:  09/16/2006  Job:  [id number]

## 2010-09-12 NOTE — Procedures (Signed)
CAROTID DUPLEX EXAM   INDICATION:  Followup of known carotid artery disease.   HISTORY:  Diabetes:  Yes.  Cardiac:  No.  Hypertension:  Yes.  Smoking:  Yes.  Previous Surgery:  Right carotid endarterectomy on 11/02/2006 by Dr.  Madilyn Fireman.  CV History:  The patient had a stroke on 09/12/2006.  The patient still  has some residual left arm weakness.  Amaurosis Fugax No, Paresthesias Yes, Hemiparesis Yes                                       RIGHT             LEFT  Brachial systolic pressure:         120               122  Brachial Doppler waveforms:         WNL               WNL  Vertebral direction of flow:        Antegrade         Antegrade  DUPLEX VELOCITIES (cm/sec)  CCA peak systolic                   103               145  ECA peak systolic                   191               138  ICA peak systolic                   131               105  ICA end diastolic                   36                40  PLAQUE MORPHOLOGY:                  Homogeneous       Heterogeneous  PLAQUE AMOUNT:                      Mild to moderate  Mild  PLAQUE LOCATION:                    ECA               ICA/ECA   IMPRESSION:  1. Patent right internal carotid artery with no evidence of      restenosis.  2. 20-39% left internal carotid artery stenosis.   ___________________________________________  P. Liliane Bade, M.D.   AC/MEDQ  D:  08/05/2008  T:  08/06/2008  Job:  [id number]

## 2010-09-12 NOTE — H&P (Signed)
NAME:  Donald [name], Donald [name] NO.:  [id number]   MEDICAL RECORD NO.:  [id number]          PATIENT TYPE:  EMS   LOCATION:  MAJO                         FACILITY:  [facility]   PHYSICIAN:  Genene Churn. Love, M.D.    DATE OF BIRTH:  12/30/1945   DATE OF ADMISSION:  09/12/2006  DATE OF DISCHARGE:                              HISTORY & PHYSICAL   This first [facility] admission for this 65 year old right-  handed white married male from [place], [place], admitted from  the emergency room after Code Stroke with left-sided weakness,  dysarthria and NIH stroke scale of 6.   HISTORY OF PRESENT ILLNESS:  Donald [name] has a history of untreated  hypertension and chronic cigarette use.  He was well until last p.m. at  11:30 p.m. when he went to bed.  He awoke this morning, was able to go  down two flights of stairs, get the newspaper and was making coffee with  his right hand and arm.  He noted while drinking coffee that it was  drooling out of the left side of his mouth.  He spoke at that time and  his wife noted significant dysarthria.  They call 911, Code Stroke was  called and he was brought to [facility] Emergency Room where  NIH stroke scale was 6.  He has a history of cigarette use and  hypertension but no known history of diabetes mellitus, stroke or heart  disease.  He denied any headache, visual loss, chest pain, palpitations  or syncope.   MEDICATIONS:  None.   SCHOOL:  He went through high school.   He works at [employer], retired in 2004.  He drinks one drink of  alcohol per month.   ALLERGIES:  He has no known history of allergies except to a RELAXING  MEDICINE.Marland Kitchen   FAMILY HISTORY:  His mother died at 64 from diabetes mellitus.  Father's  history is unknown.  He has a brother, 35, and a sister, 100, living and  well.  He has no children of his own.   PAST MEDICAL HISTORY:  Significant in that he has had no operations, no  injuries and no  hospitalizations except for pneumonia approximately 30  years ago.   PHYSICAL EXAMINATION:  GENERAL APPEARANCE:  A well-developed white male.  VITAL SIGNS:  Blood pressure lying right arm 180/80, left arm 170/80,  heart rate 72.  NECK:  There were no bruits.  NEUROLOGIC:  Mental status:  He was alert, oriented x3.  He did have  some denial, tended to ignore the left side of his body.  Cranial nerve  examination revealed questionable decreased visual field on the left  versus extinction.  He had a left 7th dysarthria.  His eyes were to the  right but did cross the midline.  Tongue was midline.  Uvula was  midline.  Gags were  present.  Pinprick was equal in the face.  He had  good sternocleidomastoid and trapezius testing.  Motor examination  revealed 5/5 strength.  He had left arm drift.  He had fair finger-to-  nose,  heel-to-shin, rapid alternating movements were intact.  He had  decreased left hand rapid alternating movement.  Sensory examination was  intact to pinprick on the left.  He stated that they  were the same.  Two-point discrimination was okay.  Joint position was intact.  Deep  reflexes were trace.  He had a right plantar response which was  downgoing, left plantar response which was upgoing.  HEENT:  Tympanic membranes clear.  He had false teeth.  LUNGS:  Clear to auscultation.  CARDIAC:  Examination of the heart revealed no murmurs.  EXTREMITIES:  There was no cyanosis, clubbing or edema.   CT scan of the brain was within normal limits.   IMPRESSION:  Right brain stroke with NIH stroke scale of 6, suspect  thrombotic etiology.  Do not know time of onset and unfortunately,  cannot give TPA at this time.   PLAN:  Admit the patient.  Have him undergo cigarette withdrawal, follow  his high blood pressure, place him on aspirin and Lovenox.           ______________________________  Genene Churn. Sandria Manly, M.D.     JML/MEDQ  D:  09/12/2006  T:  09/12/2006  Job:  [id number]

## 2010-09-12 NOTE — Assessment & Plan Note (Signed)
[name] returns to the clinic today for follow up evaluation. He is  a 65 year old right handed adult male admitted Sep 12, 2006 with history  of untreated hypertension along with tobacco abuse. He was admitted with  left sided weakness and slurred speech. MRI study showed right middle  cerebral artery infarction. MRA study showed abrupt occlusion of the  right middle cerebral artery. No TPA was initiated. Carotid Dopplers  showed internal carotid artery stenosis greater than 80% on the right.  Costal total was 243. Echocardiogram with ejection fraction showed 60%  to 65% without evidence of emboli. The patient was scheduled for surgery  for the carotid blockage over the next several weeks.   The patient was moved to the rehabilitation unit Sep 16, 2006 and  remained there through discharge on October 02, 2006.   Since discharge, the patient initially received home health therapy, but  now has been doing outpatient therapies for the past few weeks. He has  had a recent surgery, specifically a right carotid endarterectomy done  this past Tuesday, November 12, 2006 by Dr. Madilyn Fireman. He is due to follow up  and restart outpatient therapy on November 26, 2006. He is using a quad cane  with close supervision for ambulation, and is independent with bathing  and dressing for the most part. His blood sugar has been reasonably good  in the morning at 101 and only slightly elevated in the evening in the  130s. He has had a lot of constipation recently, taking Tylenol 3 for  his pain post surgery. His wife has been using Docusate 100 mg 2 tablets  b.i.d., but is still having poor results. He is completely off of  Nicotine at the present time and continues to use the nicotine patch at  7 mg per day. He plans to stay off of tobacco if at all possible.   REVIEW OF SYSTEMS:  Non-contributory.   MEDICATIONS:  1. Aspirin 81 mg 1 tablet q.i.d.  2. Tricor 145 mg.  3. Nicoderm patch 7 mg per day.  4. Glucophage  500 mg.  5. Lidoderm patch p.r.n.   PHYSICAL EXAMINATION:  GENERAL:  Reasonably well appearing, middle aged  adult male in mild to no acute discomfort.  VITAL SIGNS:  Blood pressure 149/78, pulse 104, respiratory rate 19, O2  saturation 97% on room air.  EXTREMITIES:  Left upper and left lower extremity strength were  generally 4-/5. The patient uses a quad cane in his right upper  extremity. Right sided strength was 5/5. He has a hemiparetic aid on the  left side.  NECK:  Shows a well healing surgical wound with no drainage with some  blood clots along the suture line.   IMPRESSION:  1. Status post right middle cerebral artery infarct with left      hemiparesis.  2. Status post right carotid endarterectomy by Dr. Madilyn Fireman.  3. Chronic constipation.   In the office today, we did give the patient a recommendation for use of  MiraLax which is over-the-counter. We also gave him the name of Senokot-  S which can obtained over-the-counter also. Those can both be tried for  his chronic constipation. Ideally, he would not use the Tylenol 3, as  that seems to exacerbate people's constipation problems and is probably  doing so in this individual. I have encouraged him about discontinuation  of the tobacco and he seems to be on that pathway. I have asked his wife  to check his blood sugars  after meals, no closer than 3 hours for a meal  time. She seems to be taking some of the blood sugars after the evening  meal, approximately 1 to 2 hours and that is showing some elevation. His  blood sugars in the morning are under excellent control. I suspect that  they are really under excellent control in the evening, but she is  testing too soon after the meal itself. We will plan on seeing the  patient in follow up in this office in approximately three months with  refill on medication as necessary through this office. He continues to  see his primary care physician.            ______________________________  Ellwood Dense, M.D.     DC/MedQ  D:  11/15/2006 15:09:06  T:  11/16/2006 06:25:17  Job #:  [id number]

## 2010-09-12 NOTE — H&P (Signed)
HISTORY AND PHYSICAL EXAMINATION   October 24, 2006   Re:  Donald, [name]                  DOB:  [birth year]   REASON FOR ADMISSION:  Severe right internal carotid artery stenosis  status post right hemispheric CVA.   HISTORY:  [name] is a 65 year old gentleman who was initially  admitted to [facility] on Sep 12, 2006, following the onset of  left-sided weakness and slurred speech.  An MRI at the time of admission  revealed right middle cerebral artery infarction.  No TPA was initiated.  Carotid Dopplers revealed severe right internal carotid artery stenosis  greater than 80%.  He was seen in consultation by the Vascular Surgery  service with planned right carotid endarterectomy several weeks  following initial stroke presentation.   Patient underwent inpatient rehabilitation.  He is now ambulatory with a  cane.  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  He  is living with his wife, non-institutionalized.   PAST MEDICAL HISTORY:  1. Hypertension.  2. Hyperlipidemia.  3. Tobacco abuse.  4. Type 2 diabetes mellitus.  5. Right middle cerebral artery cerebral infarction.   MEDICATIONS:  Aggrenox 1 tablet b.i.d., TriCor 145 mg daily, NicoDerm  patch 14 mg daily, Glucophage 500 mg daily, Lidoderm patch 5%, change  every 24 hours, Neurontin 300 mg q.h.s., Pepcid 20 mg q.12h., stool  softener 2 tablets at bedtime.   ALLERGIES:  Muscle relaxants.   FAMILY HISTORY:  Positive for coronary disease and diabetes.   REVIEW OF SYSTEMS:  He notes some urinary frequency.  All other  complaints are negative.   SOCIAL HISTORY:  The patient is married, lives with his wife who assists  him as needed.  He is retired from [employer].  He has not been smoking  tobacco products recently.  No recent alcohol use.   PHYSICAL EXAMINATION:  GENERAL:  This 65 year old male appears  approximately his stated age.  Obvious left facial droop.  Left  upper  extremity monoparesis.  He is otherwise alert.  His speech is slightly  slurred.  VITAL SIGNS:  The BP 150/89, pulse 69 per minute, respirations 18 per  minute.  Temperature 97.6.  HEENT:  Normocephalic.  Nontraumatic.  NECK:  Supple, no thyromegaly or adenopathy.  CARDIOVASCULAR:  Normal heart sounds without murmurs.  No carotid  bruits.  ABDOMEN:  Soft and nontender, no masses or organomegaly.  Bowel sounds  normal.  CHEST:  Clear with equal air entry bilaterally.  No rales or rhonchi.  NEUROLOGIC:  The patient is alert and oriented.  Cranial nerves show a  left facial droop.  He has a monoparesis of his left upper extremity.  Strength in his left lower extremity is 3/5.   IMPRESSION:  1. Status post right middle cerebral artery cerebrovascular accident.  2. Severe right internal carotid artery stenosis.  3. Hypertension.  4. Hyperlipidemia.  5. Tobacco abuse.  6. Type 2 diabetes.   RECOMMENDATION:  Right carotid endarterectomy for reduction of stroke  risk.  Surgery scheduled for November 12, 2006, at [facility].  The  patient will stop Aggrenox and begin aspirin 325 mg daily.  Potential  risks of the operative procedure was the major morbidity and mortality  of 1-2% to include but not limit an MI, CVA, cranial nerve injury and  death.   Balinda Quails, M.D.  Electronically Signed   PGH/MEDQ  D:  10/24/2006  T:  10/25/2006  Job:  [id number]   cc:   Annia Friendly. Loleta Chance, MD  Pramod P. Pearlean Brownie, MD  Ellwood Dense, M.D.

## 2010-09-12 NOTE — Assessment & Plan Note (Signed)
OFFICE VISIT   Donald [name], Donald [name]  DOB:  13-Aug-1945                                       10/24/2006  CHART#:[id number]   OFFICE NOTE   This is a followup visit for this patient who suffered a right  hemispheric CVA secondary to severe right internal carotid artery  stenosis on July 13, 2006.  Admitted to [facility], treated  aggressively.  Was unable to receive TPA.  Received inpatient rehab.  Discharged from rehab October 02, 2006.   In the office today, he is ambulatory with a cane.  He has a left  monoparesis of the upper extremity.  Left facial droop and slurred  speech.  BP is 150/89 left arm, 139/80 right arm, pulse 69 per minute and  regular, respirations 18 per minute.   The patient requires right carotid endarterectomy for reduction of  further stroke risk.  He is on Aggrenox b.i.d.  This will be  discontinued and he will be converted over to aspirin 325 mg daily.   Planned operative procedure November 12, 2006 at [facility], right  carotid endarterectomy.   Balinda Quails, M.D.  Electronically Signed   PGH/MEDQ  D:  10/24/2006  T:  10/25/2006  Job:  [id number]

## 2010-09-12 NOTE — Assessment & Plan Note (Signed)
OFFICE VISIT   TORELL, MINDER  DOB:  06/07/1945                                       02/08/2010  CHART#:[id number]   I saw the patient in the office today for continued follow-up of his  peripheral vascular disease.  This is a patient who had been followed by  Dr. Madilyn Fireman who has performed a previous right carotid endarterectomy.  He  had a preoperative right hemispheric stroke.  He was then seen in our  office with peripheral vascular disease and it was arranged for me to  perform an arteriogram on him which I performed on 11/14/2009.  This  showed some moderate aortoiliac occlusive disease but no real focal  disease amenable to angioplasty.  He had bilateral superficial femoral  artery occlusions.  He had stable claudication and therefore we were  treating this conservatively.  He had a history of a 2 pack day smoking  history and we also encouraged tobacco cessation.  He comes in for 3-  month follow-up visit.   He states that his biggest complaint has been a gradual onset of pain in  the left foot which is brought on by ambulation and relieved with rest.  He has mild claudication symptoms on the right.  He has had some early  rest pain in the left foot but no history of nonhealing ulcers.   PAST MEDICAL HISTORY:  Significant for diabetes, hypertension, and  hypercholesterolemia, all of which are stable on his current  medications.  He denies any previous history of myocardial infarction,  history of congestive heart failure or history of COPD.   SOCIAL HISTORY:  He is married.  He smokes 2 packs per day has been  smoking since he was 65 years old.  He does not drink alcohol on a  regular basis.   FAMILY HISTORY:  There is no history of premature cardiovascular  disease.   REVIEW OF SYSTEMS:  CARDIOVASCULAR:  He has had no chest pain, chest  pressure, palpitations or arrhythmias.  He has had no history of DVT.  PULMONARY:  He had no productive  cough, bronchitis, asthma or wheezing.   PHYSICAL EXAMINATION:  This is a pleasant 65 year old gentleman who  appears his stated age.  Blood pressure 158/95, heart rate is 73,  saturation 98%.  HEENT:  Unremarkable.  Lungs:  Clear bilaterally to auscultation.  Cardiovascular exam:  I do not detect any carotid bruits.  He has a  regular rate and rhythm.  He has palpable femoral pulses.  I cannot  palpate popliteal or pedal pulses on either side.  Abdomen:  Soft and  nontender with normal pitched bowel sounds.  Musculoskeletal exam:  He  has no major deformities or cyanosis.  Neurologic exam:  He has  significant left-sided weakness.  Skin:  There are no ulcers or rashes.   I have reviewed his arteriogram which shows mild diffuse disease of the  infrarenal aorta and bilateral common iliac arteries.  He has bilateral  superficial femoral artery occlusions.  On the left side he  reconstitutes the below-knee popliteal artery and on the right side the  above knee popliteal artery with patent tibial vessels bilaterally.   I did independently interpret his arterial Doppler study today which  shows monophasic Doppler signals in the right foot with an ABI of 67%  and monophasic Doppler signals  on the left with an ABI of 47%.   Given the progression of his symptoms on the left,  I have explained  that I think we could consider revascularization.  However, the  situation is somewhat complicated in that he has some mild aortoiliac  disease none of which appears to be critical and he does have a palpable  femoral pulse on the left which is reasonable.  Thus we could consider  doing a fem-pop bypass graft on the left although his proximal  aortoiliac disease could potentially increase his risk of graft  thrombosis or persistent symptoms.  However, the other consideration  would be to consider aortofemoral bypass grafting to address his inflow  disease which at this point is not critical.  He feels  that his symptoms  are quite tolerable and he would prefer to be seen back in 3 months.  We  have again had a long discussion about the importance of tobacco  cessation.  Will arrange for a vein mapping when he comes back also.  If  his aortoiliac disease progresses, then certainly it would make more  sense to proceed with aortobifemoral bypass graft initially.  If he  continues to have a reasonable femoral pulse, then we could consider  left fem-pop bypass grafting.  If his aortoiliac disease progresses we  could also consider repeating his arteriogram to see if he might have  disease amenable to angioplasty although he has fairly diffuse calcific  disease and also an eccentric infrarenal plaque which would probably not  be ideal for endovascular approach.  We will see him back in 3 months.  He knows to call sooner if he has problems.     Di Kindle. Edilia Bo, M.D.  Electronically Signed   CSD/MEDQ  D:  02/08/2010  T:  02/09/2010  Job:  [id number]   cc:   Annia Friendly. Loleta Chance, MD

## 2010-09-13 ENCOUNTER — Encounter (HOSPITAL_BASED_OUTPATIENT_CLINIC_OR_DEPARTMENT_OTHER)
Admission: RE | Admit: 2010-09-13 | Discharge: 2010-09-13 | Disposition: A | Discharge status: Home / Self Care | Payer: Medicare Other | Source: Ambulatory Visit | Attending: Surgery | Admitting: Surgery

## 2010-09-13 LAB — BASIC METABOLIC PANEL
Calcium: 9.8 mg/dL (ref 8.4–10.5)
Creatinine, Ser: 0.79 mg/dL (ref 0.4–1.5)
GFR calc Af Amer: >60 mL/min (ref >60)
GFR calc non Af Amer: >60 mL/min (ref >60)
Sodium: 138 mEq/L (ref 135–145)

## 2010-09-14 ENCOUNTER — Ambulatory Visit (HOSPITAL_COMMUNITY): Admission: RE | Admit: 2010-09-14 | Payer: Medicare Other | Source: Ambulatory Visit | Admitting: Surgery

## 2010-09-14 ENCOUNTER — Ambulatory Visit (HOSPITAL_COMMUNITY): Payer: Medicare Other

## 2010-09-14 ENCOUNTER — Ambulatory Visit (HOSPITAL_BASED_OUTPATIENT_CLINIC_OR_DEPARTMENT_OTHER)
Admission: RE | Admit: 2010-09-14 | Discharge: 2010-09-14 | Disposition: A | Discharge status: Home / Self Care | Payer: Medicare Other | Source: Ambulatory Visit | Attending: Surgery | Admitting: Surgery

## 2010-09-14 DIAGNOSIS — I10 Essential (primary) hypertension: Secondary | ICD-10-CM | POA: Insufficient documentation

## 2010-09-14 DIAGNOSIS — F17200 Nicotine dependence, unspecified, uncomplicated: Secondary | ICD-10-CM | POA: Insufficient documentation

## 2010-09-14 DIAGNOSIS — I739 Peripheral vascular disease, unspecified: Secondary | ICD-10-CM | POA: Insufficient documentation

## 2010-09-14 DIAGNOSIS — Z8673 Personal history of transient ischemic attack (TIA), and cerebral infarction without residual deficits: Secondary | ICD-10-CM | POA: Insufficient documentation

## 2010-09-14 DIAGNOSIS — Z79899 Other long term (current) drug therapy: Secondary | ICD-10-CM | POA: Insufficient documentation

## 2010-09-14 DIAGNOSIS — C211 Malignant neoplasm of anal canal: Secondary | ICD-10-CM | POA: Insufficient documentation

## 2010-09-14 DIAGNOSIS — K219 Gastro-esophageal reflux disease without esophagitis: Secondary | ICD-10-CM | POA: Insufficient documentation

## 2010-09-14 DIAGNOSIS — Z01812 Encounter for preprocedural laboratory examination: Secondary | ICD-10-CM | POA: Insufficient documentation

## 2010-09-14 DIAGNOSIS — C801 Malignant (primary) neoplasm, unspecified: Secondary | ICD-10-CM | POA: Insufficient documentation

## 2010-09-14 DIAGNOSIS — E119 Type 2 diabetes mellitus without complications: Secondary | ICD-10-CM | POA: Insufficient documentation

## 2010-09-14 DIAGNOSIS — I69959 Hemiplegia and hemiparesis following unspecified cerebrovascular disease affecting unspecified side: Secondary | ICD-10-CM | POA: Insufficient documentation

## 2010-09-14 LAB — GLUCOSE, CAPILLARY: Glucose-Capillary: 148 mg/dL — ABNORMAL HIGH (ref 70–99)

## 2010-09-15 NOTE — Op Note (Signed)
NAME:  Donald [name], Donald [name] NO.:  [id number]  MEDICAL RECORD NO.:  [id number]           PATIENT TYPE:  LOCATION:                                 FACILITY:  PHYSICIAN:  Clovis Pu. Kimyata Milich, M.D.DATE OF BIRTH:  April 23, 1946  DATE OF PROCEDURE:  09/14/2010 DATE OF DISCHARGE:                              OPERATIVE REPORT   PREOPERATIVE DIAGNOSIS:  History of metastatic squamous cell carcinoma, poor venous access.  POSTOPERATIVE DIAGNOSIS:  History of metastatic squamous cell carcinoma, poor venous access.  PROCEDURE:  Placement of right subclavian 8-French Power Port-A-Cath with fluoroscopy.  SURGEON:  Maisie Fus A. Dorean Hiebert, MD  ANESTHESIA:  LMA with 0.25% Sensorcaine local with epinephrine.  ESTIMATED BLOOD LOSS:  Minimal.  SPECIMENS:  None.  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  He presents for Port-A-Cath placement for chemotherapy due to poor venous access.  Risk of bleeding, infection, pneumothorax, hemothorax, injury to mediastinal structures, major arteries, veins, and nerves as well as perforation of superior vena cava, right atrium, and right ventricle, and injury to other mediastinal structures are discussed.  He understands the rationale for placement for the Port-A-Cath, the risk of doing so, and alternative therapy.  He agrees to proceed.  DESCRIPTION OF PROCEDURE:  The patient was brought to the operating room.  After induction of LMA anesthesia, both arms were tucked and upper chest and neck regions were prepped and draped in a sterile fashion.  A time-out was done.  He received appropriate preop antibiotics in a timely fashion.  He was placed in Trendelenburg.  Right subclavian vein was easily cannulated with a needle with return of dark nonpulsatile blood.  The wire was fed through without difficulty. Fluoroscopy showed the wire going down the superior  vena cava into the right ventricle.  We then made a small stab incision at this point in time after removing the needle.  A pocket was made below this using a scalpel and cautery with a 3-cm incision.  We went down to the chest wall with this.  An 8 -[place] PowerPort was brought into the field and it was attached and then flushed without difficulty.  I tunneled the catheter from the lower incision to the upper incision placing the port on the chest wall.  I then cut the catheter to about 16 cm.  We then put the patient back in Trendelenburg.  I advanced dilator over the wire moving the wire to-and-fro with no resistance.  I then placed a wire reducer complex over the wire and advanced it carefully moving the wire to-and-fro as I advanced with no resistance.  Once the introducer was placed, I removed the dilator and wire without difficulty.  The catheter was placed in the peel-away sheath and it was peeled away without difficulty.  Fluoroscopy was used which showed the tip to be in the distal superior vena cava.  No obvious pneumothorax or hemothorax noted. I then drew back on the port.  It drew back dark blood.  It flushed easily and I flushed it with a total of 20  mL of heparinized saline and then put 5 mL of 100 units/mL of heparin saline into the catheter.  I secured the catheter to the chest wall with 2-0 Prolene.  3-0 Vicryl was used to close the cavity and 4-0 Monocryl was used to close both skin incisions.  All final counts of sponge, needle, and instrument were found to be correct. The patient was then taken off anesthesia, awoke, and taken to recovery in the satisfactory condition.  Chest x-ray pending in the PACU.     Olga Bourbeau A. Devinne Epstein, M.D.     TAC/MEDQ  D:  09/14/2010  T:  09/14/2010  Job:  [id number]  cc:   Laurice Record, M.D.  Electronically Signed by Harriette Bouillon M.D. on 09/15/2010 11:11:44 AM

## 2010-09-18 ENCOUNTER — Encounter (HOSPITAL_BASED_OUTPATIENT_CLINIC_OR_DEPARTMENT_OTHER): Payer: Medicare Other | Admitting: Hematology and Oncology

## 2010-09-18 ENCOUNTER — Other Ambulatory Visit: Payer: Self-pay | Admitting: Hematology and Oncology

## 2010-09-18 DIAGNOSIS — C210 Malignant neoplasm of anus, unspecified: Secondary | ICD-10-CM

## 2010-09-18 LAB — CBC WITH DIFFERENTIAL/PLATELET
BASO%: 0.8 % (ref 0.0–2.0)
EOS%: 2 % (ref 0.0–7.0)
LYMPH%: 30.2 % (ref 14.0–49.0)
MCH: 31.8 pg (ref 27.2–33.4)
MCHC: 34.2 g/dL (ref 32.0–36.0)
MONO#: 0.7 10*3/uL (ref 0.1–0.9)
NEUT%: 55.7 % (ref 39.0–75.0)
RBC: 4.37 10*6/uL (ref 4.20–5.82)
WBC: 6.4 10*3/uL (ref 4.0–10.3)
lymph#: 1.9 10*3/uL (ref 0.9–3.3)
nRBC: 0 % (ref 0–0)

## 2010-09-18 LAB — COMPREHENSIVE METABOLIC PANEL
ALT: 37 U/L (ref 0–53)
AST: 37 U/L (ref 0–37)
Alkaline Phosphatase: 98 U/L (ref 39–117)
BUN: 11 mg/dL (ref 6–23)
Calcium: 9 mg/dL (ref 8.4–10.5)
Chloride: 104 mEq/L (ref 96–112)
Creatinine, Ser: 0.95 mg/dL (ref 0.40–1.50)
Potassium: 3.9 mEq/L (ref 3.5–5.3)

## 2010-09-19 ENCOUNTER — Encounter (HOSPITAL_BASED_OUTPATIENT_CLINIC_OR_DEPARTMENT_OTHER): Payer: Medicare Other | Admitting: Hematology and Oncology

## 2010-09-19 DIAGNOSIS — Z5111 Encounter for antineoplastic chemotherapy: Secondary | ICD-10-CM

## 2010-09-19 DIAGNOSIS — C210 Malignant neoplasm of anus, unspecified: Secondary | ICD-10-CM

## 2010-09-22 ENCOUNTER — Encounter (HOSPITAL_BASED_OUTPATIENT_CLINIC_OR_DEPARTMENT_OTHER): Payer: Medicare Other | Admitting: Hematology and Oncology

## 2010-09-22 DIAGNOSIS — R630 Anorexia: Secondary | ICD-10-CM

## 2010-09-23 ENCOUNTER — Encounter: Payer: Medicare Other | Admitting: Hematology and Oncology

## 2010-09-23 DIAGNOSIS — C210 Malignant neoplasm of anus, unspecified: Secondary | ICD-10-CM

## 2010-09-25 ENCOUNTER — Inpatient Hospital Stay (HOSPITAL_COMMUNITY)
Admission: EM | Admit: 2010-09-25 | Discharge: 2010-09-28 | DRG: 392 | Disposition: A | Discharge status: Home / Self Care | Payer: Medicare Other | Attending: Family Medicine | Admitting: Family Medicine

## 2010-09-25 DIAGNOSIS — Y92009 Unspecified place in unspecified non-institutional (private) residence as the place of occurrence of the external cause: Secondary | ICD-10-CM

## 2010-09-25 DIAGNOSIS — I10 Essential (primary) hypertension: Secondary | ICD-10-CM | POA: Diagnosis present

## 2010-09-25 DIAGNOSIS — M2550 Pain in unspecified joint: Secondary | ICD-10-CM | POA: Diagnosis present

## 2010-09-25 DIAGNOSIS — E119 Type 2 diabetes mellitus without complications: Secondary | ICD-10-CM | POA: Diagnosis present

## 2010-09-25 DIAGNOSIS — R109 Unspecified abdominal pain: Secondary | ICD-10-CM | POA: Diagnosis present

## 2010-09-25 DIAGNOSIS — Y842 Radiological procedure and radiotherapy as the cause of abnormal reaction of the patient, or of later complication, without mention of misadventure at the time of the procedure: Secondary | ICD-10-CM | POA: Diagnosis present

## 2010-09-25 DIAGNOSIS — R112 Nausea with vomiting, unspecified: Principal | ICD-10-CM | POA: Diagnosis present

## 2010-09-25 DIAGNOSIS — K6289 Other specified diseases of anus and rectum: Secondary | ICD-10-CM | POA: Diagnosis present

## 2010-09-25 DIAGNOSIS — R627 Adult failure to thrive: Secondary | ICD-10-CM | POA: Diagnosis present

## 2010-09-25 DIAGNOSIS — K649 Unspecified hemorrhoids: Secondary | ICD-10-CM | POA: Diagnosis present

## 2010-09-25 DIAGNOSIS — T451X5A Adverse effect of antineoplastic and immunosuppressive drugs, initial encounter: Secondary | ICD-10-CM | POA: Diagnosis present

## 2010-09-25 DIAGNOSIS — R51 Headache: Secondary | ICD-10-CM | POA: Diagnosis present

## 2010-09-25 DIAGNOSIS — K1230 Oral mucositis (ulcerative), unspecified: Secondary | ICD-10-CM | POA: Diagnosis present

## 2010-09-25 DIAGNOSIS — C210 Malignant neoplasm of anus, unspecified: Secondary | ICD-10-CM | POA: Diagnosis present

## 2010-09-25 DIAGNOSIS — C787 Secondary malignant neoplasm of liver and intrahepatic bile duct: Secondary | ICD-10-CM | POA: Diagnosis present

## 2010-09-25 DIAGNOSIS — F17200 Nicotine dependence, unspecified, uncomplicated: Secondary | ICD-10-CM | POA: Diagnosis present

## 2010-09-25 DIAGNOSIS — K219 Gastro-esophageal reflux disease without esophagitis: Secondary | ICD-10-CM | POA: Diagnosis present

## 2010-09-25 DIAGNOSIS — E876 Hypokalemia: Secondary | ICD-10-CM | POA: Diagnosis present

## 2010-09-25 DIAGNOSIS — K121 Other forms of stomatitis: Secondary | ICD-10-CM | POA: Diagnosis present

## 2010-09-25 DIAGNOSIS — D72829 Elevated white blood cell count, unspecified: Secondary | ICD-10-CM | POA: Diagnosis present

## 2010-09-26 ENCOUNTER — Emergency Department (HOSPITAL_COMMUNITY): Payer: Medicare Other

## 2010-09-26 ENCOUNTER — Encounter (HOSPITAL_COMMUNITY): Payer: Self-pay

## 2010-09-26 DIAGNOSIS — C180 Malignant neoplasm of cecum: Secondary | ICD-10-CM

## 2010-09-26 LAB — PROTIME-INR
INR: 1.04 (ref 0.00–1.49)
Prothrombin Time: 13.8 seconds (ref 11.6–15.2)

## 2010-09-26 LAB — COMPREHENSIVE METABOLIC PANEL
BUN: 29 mg/dL — ABNORMAL HIGH (ref 6–23)
Calcium: 8.6 mg/dL (ref 8.4–10.5)
Glucose, Bld: 120 mg/dL — ABNORMAL HIGH (ref 70–99)
Sodium: 133 mEq/L — ABNORMAL LOW (ref 135–145)
Total Protein: 6.7 g/dL (ref 6.0–8.3)

## 2010-09-26 LAB — PHOSPHORUS: Phosphorus: 2.7 mg/dL (ref 2.3–4.6)

## 2010-09-26 LAB — URINALYSIS, ROUTINE W REFLEX MICROSCOPIC
Ketones, ur: 15 mg/dL — AB
Nitrite: NEGATIVE
Protein, ur: NEGATIVE mg/dL
Urobilinogen, UA: 0.2 mg/dL (ref 0.0–1.0)

## 2010-09-26 LAB — DIFFERENTIAL
Band Neutrophils: 0 % (ref 0–10)
Basophils Absolute: 0 10*3/uL (ref 0.0–0.1)
Basophils Relative: 0 % (ref 0–1)
Eosinophils Absolute: 0 10*3/uL (ref 0.0–0.7)
Eosinophils Relative: 0 % (ref 0–5)
Metamyelocytes Relative: 0 %
Myelocytes: 0 %
Neutro Abs: 31.9 10*3/uL — ABNORMAL HIGH (ref 1.7–7.7)
Neutrophils Relative %: 90 % — ABNORMAL HIGH (ref 43–77)
WBC Morphology: INCREASED

## 2010-09-26 LAB — LIPASE, BLOOD: Lipase: 17 U/L (ref 11–59)

## 2010-09-26 LAB — GLUCOSE, CAPILLARY: Glucose-Capillary: 116 mg/dL — ABNORMAL HIGH (ref 70–99)

## 2010-09-26 LAB — CBC
MCH: 31.8 pg (ref 26.0–34.0)
Platelets: 228 10*3/uL (ref 150–400)
RBC: 4.31 MIL/uL (ref 4.22–5.81)

## 2010-09-26 LAB — CK: Total CK: 65 U/L (ref 7–232)

## 2010-09-27 DIAGNOSIS — K648 Other hemorrhoids: Secondary | ICD-10-CM

## 2010-09-27 DIAGNOSIS — K520 Gastroenteritis and colitis due to radiation: Secondary | ICD-10-CM

## 2010-09-27 DIAGNOSIS — K625 Hemorrhage of anus and rectum: Secondary | ICD-10-CM

## 2010-09-27 LAB — CBC
MCV: 92.4 fL (ref 78.0–100.0)
Platelets: 191 10*3/uL (ref 150–400)
RDW: 13.1 % (ref 11.5–15.5)
WBC: 17.6 10*3/uL — ABNORMAL HIGH (ref 4.0–10.5)

## 2010-09-27 LAB — URINE CULTURE: Colony Count: 9000

## 2010-09-27 LAB — GLUCOSE, CAPILLARY
Glucose-Capillary: 103 mg/dL — ABNORMAL HIGH (ref 70–99)
Glucose-Capillary: 124 mg/dL — ABNORMAL HIGH (ref 70–99)
Glucose-Capillary: 92 mg/dL (ref 70–99)
Glucose-Capillary: 92 mg/dL (ref 70–99)

## 2010-09-27 LAB — COMPREHENSIVE METABOLIC PANEL
Albumin: 2.4 g/dL — ABNORMAL LOW (ref 3.5–5.2)
BUN: 18 mg/dL (ref 6–23)
Chloride: 107 mEq/L (ref 96–112)
Creatinine, Ser: 0.95 mg/dL (ref 0.4–1.5)
Total Bilirubin: 0.4 mg/dL (ref 0.3–1.2)

## 2010-09-27 LAB — DIFFERENTIAL
Eosinophils Absolute: 0 10*3/uL (ref 0.0–0.7)
Monocytes Absolute: 1.1 10*3/uL — ABNORMAL HIGH (ref 0.1–1.0)
Neutrophils Relative %: 83 % — ABNORMAL HIGH (ref 43–77)

## 2010-09-27 LAB — MAGNESIUM: Magnesium: 1.8 mg/dL (ref 1.5–2.5)

## 2010-09-27 LAB — APTT: aPTT: 28 seconds (ref 24–37)

## 2010-09-27 LAB — PHOSPHORUS: Phosphorus: 1.7 mg/dL — ABNORMAL LOW (ref 2.3–4.6)

## 2010-09-27 LAB — PROTIME-INR: INR: 1.14 (ref 0.00–1.49)

## 2010-09-28 ENCOUNTER — Other Ambulatory Visit: Payer: Self-pay | Admitting: Internal Medicine

## 2010-09-28 DIAGNOSIS — K5900 Constipation, unspecified: Secondary | ICD-10-CM

## 2010-09-28 DIAGNOSIS — K6289 Other specified diseases of anus and rectum: Secondary | ICD-10-CM

## 2010-09-28 DIAGNOSIS — K921 Melena: Secondary | ICD-10-CM

## 2010-09-28 LAB — GLUCOSE, CAPILLARY
Glucose-Capillary: 86 mg/dL (ref 70–99)
Glucose-Capillary: 84 mg/dL (ref 70–99)

## 2010-09-28 LAB — BASIC METABOLIC PANEL
CO2: 23 mEq/L (ref 19–32)
Calcium: 8.2 mg/dL — ABNORMAL LOW (ref 8.4–10.5)
Creatinine, Ser: 1.08 mg/dL (ref 0.4–1.5)
GFR calc Af Amer: >60 mL/min (ref >60)
GFR calc non Af Amer: >60 mL/min (ref >60)
Glucose, Bld: 88 mg/dL (ref 70–99)

## 2010-09-28 LAB — CBC
HCT: 34.7 % — ABNORMAL LOW (ref 39.0–52.0)
Hemoglobin: 11.6 g/dL — ABNORMAL LOW (ref 13.0–17.0)
MCH: 30.9 pg (ref 26.0–34.0)
MCHC: 33.4 g/dL (ref 30.0–36.0)
RDW: 13.3 % (ref 11.5–15.5)

## 2010-09-30 ENCOUNTER — Encounter: Payer: Self-pay | Admitting: Internal Medicine

## 2010-10-02 LAB — CULTURE, BLOOD (ROUTINE X 2)
Culture  Setup Time: 201205290836
Culture: NO GROWTH

## 2010-10-06 ENCOUNTER — Emergency Department (HOSPITAL_COMMUNITY): Payer: Medicare Other

## 2010-10-06 ENCOUNTER — Emergency Department (HOSPITAL_COMMUNITY)
Admission: EM | Admit: 2010-10-06 | Discharge: 2010-10-06 | Discharge status: Against Medical Advice | Payer: Medicare Other | Attending: Emergency Medicine | Admitting: Emergency Medicine

## 2010-10-06 ENCOUNTER — Emergency Department (HOSPITAL_COMMUNITY)
Admission: EM | Admit: 2010-10-06 | Discharge: 2010-10-06 | Disposition: A | Discharge status: Home / Self Care | Payer: Medicare Other | Attending: Emergency Medicine | Admitting: Emergency Medicine

## 2010-10-06 DIAGNOSIS — Y92009 Unspecified place in unspecified non-institutional (private) residence as the place of occurrence of the external cause: Secondary | ICD-10-CM | POA: Insufficient documentation

## 2010-10-06 DIAGNOSIS — S0180XA Unspecified open wound of other part of head, initial encounter: Secondary | ICD-10-CM | POA: Insufficient documentation

## 2010-10-06 DIAGNOSIS — Z8673 Personal history of transient ischemic attack (TIA), and cerebral infarction without residual deficits: Secondary | ICD-10-CM | POA: Insufficient documentation

## 2010-10-06 DIAGNOSIS — Z85048 Personal history of other malignant neoplasm of rectum, rectosigmoid junction, and anus: Secondary | ICD-10-CM | POA: Insufficient documentation

## 2010-10-06 DIAGNOSIS — E119 Type 2 diabetes mellitus without complications: Secondary | ICD-10-CM | POA: Insufficient documentation

## 2010-10-06 DIAGNOSIS — S0990XA Unspecified injury of head, initial encounter: Secondary | ICD-10-CM | POA: Insufficient documentation

## 2010-10-06 DIAGNOSIS — W010XXA Fall on same level from slipping, tripping and stumbling without subsequent striking against object, initial encounter: Secondary | ICD-10-CM | POA: Insufficient documentation

## 2010-10-06 DIAGNOSIS — Y998 Other external cause status: Secondary | ICD-10-CM | POA: Insufficient documentation

## 2010-10-06 DIAGNOSIS — I10 Essential (primary) hypertension: Secondary | ICD-10-CM | POA: Insufficient documentation

## 2010-10-06 DIAGNOSIS — Z79899 Other long term (current) drug therapy: Secondary | ICD-10-CM | POA: Insufficient documentation

## 2010-10-06 DIAGNOSIS — Y93E1 Activity, personal bathing and showering: Secondary | ICD-10-CM | POA: Insufficient documentation

## 2010-10-08 NOTE — H&P (Signed)
NAME:  Donald [name], Donald [name] NO.:  [id number]  MEDICAL RECORD NO.:  [id number]           PATIENT TYPE:  LOCATION:                                 FACILITY:  PHYSICIAN:  Michiel Cowboy, MDDATE OF BIRTH:  1945-06-13  DATE OF ADMISSION:  09/26/2010 DATE OF DISCHARGE:                             HISTORY & PHYSICAL   PRIMARY CARE PROVIDER:  Lauretta I. Odogwu, M.D., Hematology/Oncology. His primary is at [facility].  CHIEF COMPLAINT:  Generalized malaise, myalgias, arthralgias, nausea, vomiting, and abdominal pain.  HISTORY OF PRESENT ILLNESS:  The patient is a 64 year old gentleman with history of squamous cell carcinoma of the rectum, metastatic to the liver, on chemotherapy of possibly cisplatin, although his family is not sure.  He recently started the new regimen, he received a dose last week, and finished just a few days ago.  Since then, he developed nausea, vomiting, abdominal discomfort which is generalized; headache, myalgias; "bone pains."  He denied any chest pains or shortness of breath.  He has not had any fevers or chills.  No rashes.  No new neurological deficits.  Otherwise, review of systems is negative.  Ten systems are reviewed.  PAST MEDICAL HISTORY:  Significant for: 1. Rectal squamous cell carcinoma, metastatic to the liver. 2. Diabetes. 3. Hypertension. 4. Gout. 5. GERD. 6. Also, a history of CVA in 2008 with residual left-sided weakness,     which was unchanged.  SOCIAL HISTORY:  The patient continues to smoke about a 1-1/2 packs a day.  He does not drink or abuse drugs.  FAMILY HISTORY:  Significant for father and sister of diabetes.  ALLERGIES:  MUSCLE RELAXANT caused a rash.  MEDICATIONS: 1. Percocet 10/325 mg as needed. 2. Aggrenox 2 caps daily. 3. Metformin 500 mg daily. 4. Pulmicort twice daily. 5. Omeprazole 20 mg daily. 6. Lisinopril 5 mg daily. 7. Colace 4 caps daily. 8. Bifidobacterium infantis 1 tab daily. 9.  Febuxostat 40 mg daily. 10.Meloxicam 15 mg as needed. 11.Lorazepam 1 mg needed. 12.The patient takes with his chemotherapy dexamethasone, just     finished a 4-day course of 8 mg b.i.d.  PHYSICAL EXAMINATION:  VITAL SIGNS:  Temperature 98.8, blood pressure 109/72, pulse 89, respirations 22, satting 94% on room air.  After receiving IV pain medication, he did have a brief episode of desatting down to 83 when he became a little somnolent, but this has improved. Now, he is on 2 L of oxygen and satting 98% and alert and oriented.  The patient appears to be somewhat uncomfortable. HEET:  Head, nontraumatic.  Somewhat dryish mucous membranes, but normal skin turgor. LUNGS:  Occasional wheezes throughout.  No crackles. HEART:  Regular rhythm.  No murmurs appreciated. ABDOMEN:  Soft, nontender, and nondistended. LOWER EXTREMITIES:  Without clubbing, cyanosis, or edema. NEUROLOGIC:  There are deficits on the left with decreased grip and slightly decreased leg strength.  Otherwise, neurologically intact. SKIN:  No rashes noted.  LABORATORY DATA:  White blood cell count 35.5, hemoglobin 13.7.  Sodium 133, potassium 3.6, creatinine 1.46, alk phos 143, albumin 3.2.  Lipase, normal.  CK, normal.  UA, unremarkable.  CT  scan of the head showed no acute changes.  KUB and chest x-ray showed no abnormalities.  ASSESSMENT AND PLAN:  This is a 65 year old gentleman with history of squamous cell rectal carcinoma here with generalized malaise, nausea, vomiting, abdominal pain, and headaches.  I think this is related to his recent chemotherapy.  He does appear to be slightly dehydrated.  He does have leukocytosis, but no fevers.  Leukocytosis could be explained by just recent steroid use. 1. Possible chemotherapy reaction with malaise and myalgias.  We will     have supportive treatment.  The patient is in severe pain and     requires IV pain medications to make him comfortable, would need to     have  oncology consult in a.m. since they know the patient well and     if they really do agree that his symptomatology could be explained     by reaction to chemotherapy he received. 2. Leukocytosis.  I think this is mostly related to his recent steroid     use, but continued to watch it for any evidence of infection.     Viral infection can present like this as well. 3. Mild dehydration, probably secondary to poor p.o. intake, nausea     and vomiting.  We will give IV fluids and follow up. 4. History of rectal cancer as per Oncology. 5. Diabetes.  We will put him on sliding scale.  Hold metformin. 6. History of hypertension.  He has some low blood pressures.  We will     hold lisinopril, especially given slightly elevated creatinine. 7. Prophylaxis.  Protonix and Lovenox. 8. History of cerebrovascular accident.  We will continue Aggrenox.  CODE STATUS:  The patient wished to be DNR and DNI.  I spent 1 hour on this admission.     Michiel Cowboy, MD     AVD/MEDQ  D:  09/26/2010  T:  09/26/2010  Job:  [id number]  cc:   Laurice Record, M.D. Fax: [phone]  [facility]  Electronically Signed by Therisa Doyne MD on 10/08/2010 02:41:46 AM

## 2010-10-11 NOTE — Discharge Summary (Signed)
NAME:  Donald [name], Donald [name] NO.:  [id number]  MEDICAL RECORD NO.:  [id number]           PATIENT TYPE:  I  LOCATION:  1313                         FACILITY:  [facility]  PHYSICIAN:  Brendia Sacks, MD    DATE OF BIRTH:  [birth year]  DATE OF ADMISSION:  09/25/2010 DATE OF DISCHARGE:  09/28/2010                              DISCHARGE SUMMARY   PRIMARY CARE PHYSICIAN:  [facility].  PRIMARY ONCOLOGIST:  Jamie Brookes. Odogwu, MD  CONDITION ON DISCHARGE:  Improved.  DISPOSITION:  The patient will be discharged home.  He has been assessed by Physical Therapy and home health physical therapy has been recommended, but he refuses this.  DISCHARGE DIAGNOSES: 1. Nausea, vomiting, and abdominal pain, resolved felt secondary to     chemotherapy. 2. Leukocytosis secondary to Neulasta, resolving. 3. 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 4. Mucositis. 5. Diabetes mellitus type 2, stable. 6. Hypertension, stable.  HISTORY OF PRESENT ILLNESS:  A 65 year old male with a history of metastatic squamous cell carcinoma of the anus, presented with abdominal pain following chemotherapy and Neulasta injection.  HOSPITAL COURSE: 1. Nausea, vomiting, and abdominal pain.  The patient was admitted to     medical floor.  Abdominal imaging was negative.  He was treated     with supportive care and pain medication.  His symptoms have     essentially resolved at this point.  He continues on Percocet as     per Dr. Dalene Carrow.  His symptoms are thought to be related to     chemotherapy. 2. Leukocytosis.  This is trending down nicely and is related to     Neulasta injection.  The patient had blood cultures performed which     were negative to date and there were no signs or symptoms of     infection at this point.  Antibiotics have been discontinued. 3. Rectal bleeding.  The patient did have some gross bright red blood     per rectum.  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  Recommendations were for Canasa     suppository 1000 mg nightly, follow up with Dr. Christella Hartigan in 3 years     for repeat flexible sigmoidoscopy.  Biopsies pending as per Dr.     Juanda Chance.  Hemoglobin has remained stable.  He has had no further     bleeding and was stable for discharge home. 4. Diabetes mellitus type 2.  This has remained stable. 5. Mucositis.  Dr. Dalene Carrow has placed the patient on Diflucan. 6. Hypertension.  This has remained stable.  CONSULTATIONS: 1. Oncology, Laurice Record, MD, recommendations as above. 2. Gastroenterology, Hedwig Morton. Juanda Chance, MD, recommendations as above.  PROCEDURE:  Flexible sigmoidoscopy which showed radiation proctitis.  IMAGING: 1. Abdominal x-ray on May 29th:  Unremarkable bowel gas pattern.  No     free intra-abdominal air seen.  No acute cardiopulmonary process     identified.  Cholelithiasis noted. 2. CT of the  head on May 29th:  No acute intracranial pathology seen.     Chronic encephalomalacia of the right frontoparietal region     reflecting remote infarct.  Mild cortical volume loss.  MICROBIOLOGY: 1. Urine culture showed insignificant growth. 2. Blood cultures x2 on May 29th, no growth today.  PERTINENT LABORATORY STUDIES: 1. Serum lipase was within normal limits. 2. Hemoglobin A1c was 8.5. 3. CBC notable for a white blood cell count of 35.5 on admission, on     discharge 11.3; hemoglobin stable at 11.6. 4. Capillary blood sugars well controlled with no hypoglycemia. 5. Basic metabolic panel, essentially unremarkable on discharge.     Potassium will be repleted. 6. Urinalysis was negative.  PHYSICAL EXAMINATION:  GENERAL:  On discharge, the patient is feeling well.  No complaints. VITAL SIGNS:  He remains afebrile temperature is 97.5, pulse 80, respirations 15, blood pressure was  162/83, saturation 94% on room air. CARDIOVASCULAR:  Regular rate and rhythm.  Normal respiratory effort. No murmur, rub, or gallop. RESPIRATORY:  Clear to auscultation bilaterally.  No wheezes, rales, or rhonchi. ABDOMEN:  Soft, nontender, nondistended.  Positive bowel sounds. EXTREMITIES:  No lower extremity edema.  DISCHARGE INSTRUCTIONS:  The patient will be discharged home today. Diet is a heart-healthy diabetic diet.  Increase activity slowly, use a cane or walker.  Follow up with the [facility] in 2 weeks and Dr. Dalene Carrow as scheduled.  DISCHARGE MEDICATIONS: 1. Diflucan 100 mg p.o. daily for the next 5 days. 2. Megace 40 mg/mL 400 mg p.o. daily. 3. Canasa 1000 mg suppository rectally nightly. 4. Percocet 5/325 one to two tablets by mouth every 4 hours as needed     #40, no refills. 5. Polyethylene glycol 17 g p.o. daily. 6. Senokot 2 tablets p.o. daily as needed. 7. Aggrenox 1 capsule p.o. b.i.d. 8. Align 4 mg p.o. nightly. 9. Docusate 100 mg tablet 2 capsules p.o. b.i.d. 10.Lisinopril 5 mg p.o. daily. 11.Lovaza 1 g 2 capsules p.o. b.i.d. 12.Meloxicam 15 mg p.o. daily. 13.Metformin 500 mg p.o. daily. 14.Omeprazole 20 mg p.o. daily. 15.Uloric 40 mg p.o. daily.  PENDING STUDIES:  At the time of discharge, blood cultures.  TIME COORDINATING DISCHARGE:  35 minutes.   Addendum: Blood cultures final, no growth.  Brendia Sacks, MD     DG/MEDQ  D:  09/28/2010  T:  09/29/2010  Job:  [id number]  cc:   Laurice Record, M.D. Fax: [phone]  [facility]  Electronically Signed by Brendia Sacks  on 10/11/2010 05:49:36 PM

## 2010-10-13 ENCOUNTER — Other Ambulatory Visit: Payer: Self-pay | Admitting: Hematology and Oncology

## 2010-10-13 ENCOUNTER — Emergency Department (HOSPITAL_COMMUNITY)
Admission: EM | Admit: 2010-10-13 | Discharge: 2010-10-13 | Disposition: A | Discharge status: Home / Self Care | Payer: Medicare Other | Attending: Emergency Medicine | Admitting: Emergency Medicine

## 2010-10-13 ENCOUNTER — Encounter (HOSPITAL_BASED_OUTPATIENT_CLINIC_OR_DEPARTMENT_OTHER): Payer: Medicare Other | Admitting: Hematology and Oncology

## 2010-10-13 DIAGNOSIS — R630 Anorexia: Secondary | ICD-10-CM

## 2010-10-13 DIAGNOSIS — C210 Malignant neoplasm of anus, unspecified: Secondary | ICD-10-CM

## 2010-10-13 DIAGNOSIS — Z4802 Encounter for removal of sutures: Secondary | ICD-10-CM | POA: Insufficient documentation

## 2010-10-13 LAB — COMPREHENSIVE METABOLIC PANEL
AST: 90 U/L — ABNORMAL HIGH (ref 0–37)
Albumin: 4.2 g/dL (ref 3.5–5.2)
BUN: 16 mg/dL (ref 6–23)
CO2: 21 mEq/L (ref 19–32)
Calcium: 9.5 mg/dL (ref 8.4–10.5)
Chloride: 104 mEq/L (ref 96–112)
Glucose, Bld: 199 mg/dL — ABNORMAL HIGH (ref 70–99)
Potassium: 4.6 mEq/L (ref 3.5–5.3)

## 2010-10-13 LAB — CBC WITH DIFFERENTIAL/PLATELET
Basophils Absolute: 0.1 10*3/uL (ref 0.0–0.1)
Eosinophils Absolute: 0.1 10*3/uL (ref 0.0–0.5)
HGB: 12.9 g/dL — ABNORMAL LOW (ref 13.0–17.1)
MONO#: 0.9 10*3/uL (ref 0.1–0.9)
NEUT#: 8.1 10*3/uL — ABNORMAL HIGH (ref 1.5–6.5)
RDW: 15.2 % — ABNORMAL HIGH (ref 11.0–14.6)
lymph#: 1.6 10*3/uL (ref 0.9–3.3)

## 2010-10-23 ENCOUNTER — Other Ambulatory Visit: Payer: Self-pay | Admitting: Hematology and Oncology

## 2010-10-23 ENCOUNTER — Encounter (HOSPITAL_BASED_OUTPATIENT_CLINIC_OR_DEPARTMENT_OTHER): Payer: Medicare Other | Admitting: Hematology and Oncology

## 2010-10-23 DIAGNOSIS — Z5111 Encounter for antineoplastic chemotherapy: Secondary | ICD-10-CM

## 2010-10-23 DIAGNOSIS — C210 Malignant neoplasm of anus, unspecified: Secondary | ICD-10-CM

## 2010-10-23 DIAGNOSIS — R630 Anorexia: Secondary | ICD-10-CM

## 2010-10-23 LAB — CBC WITH DIFFERENTIAL/PLATELET
Eosinophils Absolute: 0.1 10*3/uL (ref 0.0–0.5)
MCV: 93.3 fL (ref 79.3–98.0)
MONO%: 11 % (ref 0.0–14.0)
NEUT#: 4.4 10*3/uL (ref 1.5–6.5)
RBC: 3.89 10*6/uL — ABNORMAL LOW (ref 4.20–5.82)
RDW: 14.9 % — ABNORMAL HIGH (ref 11.0–14.6)
WBC: 7.2 10*3/uL (ref 4.0–10.3)
nRBC: 0 % (ref 0–0)

## 2010-10-24 ENCOUNTER — Encounter (HOSPITAL_BASED_OUTPATIENT_CLINIC_OR_DEPARTMENT_OTHER): Payer: Medicare Other | Admitting: Hematology and Oncology

## 2010-10-24 DIAGNOSIS — R630 Anorexia: Secondary | ICD-10-CM

## 2010-10-24 DIAGNOSIS — Z5111 Encounter for antineoplastic chemotherapy: Secondary | ICD-10-CM

## 2010-10-24 DIAGNOSIS — C210 Malignant neoplasm of anus, unspecified: Secondary | ICD-10-CM

## 2010-10-28 ENCOUNTER — Encounter (HOSPITAL_BASED_OUTPATIENT_CLINIC_OR_DEPARTMENT_OTHER): Payer: Medicare Other | Admitting: Hematology and Oncology

## 2010-10-28 DIAGNOSIS — Z5111 Encounter for antineoplastic chemotherapy: Secondary | ICD-10-CM

## 2010-10-28 DIAGNOSIS — R630 Anorexia: Secondary | ICD-10-CM

## 2010-10-28 DIAGNOSIS — C210 Malignant neoplasm of anus, unspecified: Secondary | ICD-10-CM

## 2010-10-29 ENCOUNTER — Encounter (HOSPITAL_BASED_OUTPATIENT_CLINIC_OR_DEPARTMENT_OTHER): Payer: Medicare Other | Admitting: Hematology and Oncology

## 2010-10-29 DIAGNOSIS — Z5111 Encounter for antineoplastic chemotherapy: Secondary | ICD-10-CM

## 2010-10-29 DIAGNOSIS — C210 Malignant neoplasm of anus, unspecified: Secondary | ICD-10-CM

## 2010-10-30 ENCOUNTER — Encounter (HOSPITAL_BASED_OUTPATIENT_CLINIC_OR_DEPARTMENT_OTHER): Payer: Medicare Other | Admitting: Hematology and Oncology

## 2010-10-30 DIAGNOSIS — Z5111 Encounter for antineoplastic chemotherapy: Secondary | ICD-10-CM

## 2010-10-30 DIAGNOSIS — C210 Malignant neoplasm of anus, unspecified: Secondary | ICD-10-CM

## 2010-10-31 ENCOUNTER — Emergency Department (HOSPITAL_COMMUNITY)
Admission: EM | Admit: 2010-10-31 | Discharge: 2010-10-31 | Disposition: A | Discharge status: Home / Self Care | Payer: Medicare Other | Attending: Emergency Medicine | Admitting: Emergency Medicine

## 2010-10-31 DIAGNOSIS — IMO0001 Reserved for inherently not codable concepts without codable children: Secondary | ICD-10-CM | POA: Insufficient documentation

## 2010-10-31 DIAGNOSIS — R5383 Other fatigue: Secondary | ICD-10-CM | POA: Insufficient documentation

## 2010-10-31 DIAGNOSIS — Z79899 Other long term (current) drug therapy: Secondary | ICD-10-CM | POA: Insufficient documentation

## 2010-10-31 DIAGNOSIS — C229 Malignant neoplasm of liver, not specified as primary or secondary: Secondary | ICD-10-CM | POA: Insufficient documentation

## 2010-10-31 DIAGNOSIS — R5381 Other malaise: Secondary | ICD-10-CM | POA: Insufficient documentation

## 2010-10-31 DIAGNOSIS — I10 Essential (primary) hypertension: Secondary | ICD-10-CM | POA: Insufficient documentation

## 2010-10-31 DIAGNOSIS — E119 Type 2 diabetes mellitus without complications: Secondary | ICD-10-CM | POA: Insufficient documentation

## 2010-10-31 LAB — URINALYSIS, ROUTINE W REFLEX MICROSCOPIC
Ketones, ur: 15 mg/dL — AB
Leukocytes, UA: NEGATIVE
Nitrite: NEGATIVE
Protein, ur: 30 mg/dL — AB
Urobilinogen, UA: 0.2 mg/dL (ref 0.0–1.0)
pH: 6 (ref 5.0–8.0)

## 2010-10-31 LAB — BASIC METABOLIC PANEL
BUN: 19 mg/dL (ref 6–23)
CO2: 26 mEq/L (ref 19–32)
Calcium: 8.5 mg/dL (ref 8.4–10.5)
Chloride: 99 mEq/L (ref 96–112)
Creatinine, Ser: 0.87 mg/dL (ref 0.50–1.35)
Glucose, Bld: 124 mg/dL — ABNORMAL HIGH (ref 70–99)

## 2010-10-31 LAB — CBC
HCT: 33.9 % — ABNORMAL LOW (ref 39.0–52.0)
MCH: 31.7 pg (ref 26.0–34.0)
MCV: 93.4 fL (ref 78.0–100.0)
Platelets: 194 10*3/uL (ref 150–400)
RBC: 3.63 MIL/uL — ABNORMAL LOW (ref 4.22–5.81)

## 2010-10-31 LAB — DIFFERENTIAL
Basophils Relative: 0 % (ref 0–1)
Eosinophils Relative: 0 % (ref 0–5)
Lymphs Abs: 3.2 10*3/uL (ref 0.7–4.0)
Monocytes Relative: 4 % (ref 3–12)
Neutro Abs: 40.1 10*3/uL — ABNORMAL HIGH (ref 1.7–7.7)

## 2010-10-31 LAB — CK: Total CK: 40 U/L (ref 7–232)

## 2010-11-01 ENCOUNTER — Encounter: Payer: Medicare Other | Admitting: Hematology and Oncology

## 2010-11-05 ENCOUNTER — Emergency Department (HOSPITAL_COMMUNITY)
Admission: EM | Admit: 2010-11-05 | Discharge: 2010-11-05 | Disposition: A | Discharge status: Home / Self Care | Payer: Medicare Other | Attending: Emergency Medicine | Admitting: Emergency Medicine

## 2010-11-05 DIAGNOSIS — I808 Phlebitis and thrombophlebitis of other sites: Secondary | ICD-10-CM | POA: Insufficient documentation

## 2010-11-05 DIAGNOSIS — Z79899 Other long term (current) drug therapy: Secondary | ICD-10-CM | POA: Insufficient documentation

## 2010-11-05 DIAGNOSIS — E119 Type 2 diabetes mellitus without complications: Secondary | ICD-10-CM | POA: Insufficient documentation

## 2010-11-05 DIAGNOSIS — I10 Essential (primary) hypertension: Secondary | ICD-10-CM | POA: Insufficient documentation

## 2010-11-07 ENCOUNTER — Other Ambulatory Visit: Payer: Self-pay | Admitting: Hematology and Oncology

## 2010-11-07 ENCOUNTER — Encounter (HOSPITAL_BASED_OUTPATIENT_CLINIC_OR_DEPARTMENT_OTHER): Payer: Medicare Other | Admitting: Hematology and Oncology

## 2010-11-07 DIAGNOSIS — R630 Anorexia: Secondary | ICD-10-CM

## 2010-11-07 DIAGNOSIS — C210 Malignant neoplasm of anus, unspecified: Secondary | ICD-10-CM

## 2010-11-07 DIAGNOSIS — Z5111 Encounter for antineoplastic chemotherapy: Secondary | ICD-10-CM

## 2010-11-07 LAB — BASIC METABOLIC PANEL
BUN: 13 mg/dL (ref 6–23)
Calcium: 9.1 mg/dL (ref 8.4–10.5)
Chloride: 106 mEq/L (ref 96–112)
Creatinine, Ser: 0.86 mg/dL (ref 0.50–1.35)

## 2010-11-07 LAB — CBC WITH DIFFERENTIAL/PLATELET
BASO%: 0.6 % (ref 0.0–2.0)
Basophils Absolute: 0 10*3/uL (ref 0.0–0.1)
EOS%: 1.3 % (ref 0.0–7.0)
HCT: 35 % — ABNORMAL LOW (ref 38.4–49.9)
HGB: 11.9 g/dL — ABNORMAL LOW (ref 13.0–17.1)
LYMPH%: 32.3 % (ref 14.0–49.0)
MCH: 33 pg (ref 27.2–33.4)
MCHC: 34.2 g/dL (ref 32.0–36.0)
MCV: 96.6 fL (ref 79.3–98.0)
MONO%: 5.7 % (ref 0.0–14.0)
NEUT%: 60.1 % (ref 39.0–75.0)
Platelets: 177 10*3/uL (ref 140–400)

## 2010-11-09 NOTE — Procedures (Unsigned)
CAROTID DUPLEX EXAM  INDICATION:  Follow up carotid artery stenosis.  HISTORY: Diabetes:  Yes. Cardiac:  No. Hypertension:  Yes. Smoking:  Yes. Previous Surgery:  Right carotid endarterectomy on 11/02/2006. CV History:  CVA on 09/12/2006.  Patient's left side is still affected. Amaurosis Fugax No, Paresthesias Yes, Hemiparesis Yes.                                      RIGHT             LEFT Brachial systolic pressure:         138               132 Brachial Doppler waveforms:         WNL               WNL Vertebral direction of flow:        Antegrade         Antegrade DUPLEX VELOCITIES (cm/sec) CCA peak systolic                   80                136 ECA peak systolic                   205               134 ICA peak systolic                   89                134 ICA end diastolic                   23                62 PLAQUE MORPHOLOGY:                  N/V               Heterogenous PLAQUE AMOUNT:                      Minimal           Mild-to-moderate PLAQUE LOCATION:                    N/V               ICA  IMPRESSION: 1. Right external carotid artery stenosis is present. 2. Right internal carotid artery appears patent with minimal     myointimal thickening present with history of endarterectomy. 3. Left common carotid artery appears patent with elevated velocities     present throughout its entirety. 4. Left internal carotid artery disease present in the 40% to 59%     range. 5. Slight increase in disease since previous study on 08/11/2009.  ___________________________________________ Di Kindle. Edilia Bo, M.D.  SH/MEDQ  D:  08/31/2010  T:  09/01/2010  Job:  [id number]

## 2010-11-17 ENCOUNTER — Encounter (HOSPITAL_BASED_OUTPATIENT_CLINIC_OR_DEPARTMENT_OTHER): Payer: Medicare Other | Admitting: Hematology and Oncology

## 2010-11-17 ENCOUNTER — Other Ambulatory Visit: Payer: Self-pay | Admitting: Hematology and Oncology

## 2010-11-17 DIAGNOSIS — C210 Malignant neoplasm of anus, unspecified: Secondary | ICD-10-CM

## 2010-11-17 DIAGNOSIS — Z5111 Encounter for antineoplastic chemotherapy: Secondary | ICD-10-CM

## 2010-11-17 DIAGNOSIS — R630 Anorexia: Secondary | ICD-10-CM

## 2010-11-17 LAB — CBC WITH DIFFERENTIAL/PLATELET
BASO%: 0.7 % (ref 0.0–2.0)
Eosinophils Absolute: 0.2 10*3/uL (ref 0.0–0.5)
LYMPH%: 43 % (ref 14.0–49.0)
MCHC: 33.9 g/dL (ref 32.0–36.0)
MONO#: 0.5 10*3/uL (ref 0.1–0.9)
MONO%: 15 % — ABNORMAL HIGH (ref 0.0–14.0)
NEUT#: 1.1 10*3/uL — ABNORMAL LOW (ref 1.5–6.5)
Platelets: 238 10*3/uL (ref 140–400)
RBC: 3.63 10*6/uL — ABNORMAL LOW (ref 4.20–5.82)
RDW: 15.7 % — ABNORMAL HIGH (ref 11.0–14.6)
WBC: 3.1 10*3/uL — ABNORMAL LOW (ref 4.0–10.3)
nRBC: 0 % (ref 0–0)

## 2010-11-17 LAB — COMPREHENSIVE METABOLIC PANEL
ALT: 30 U/L (ref 0–53)
AST: 37 U/L (ref 0–37)
Creatinine, Ser: 0.94 mg/dL (ref 0.50–1.35)
Sodium: 138 mEq/L (ref 135–145)
Total Bilirubin: 0.4 mg/dL (ref 0.3–1.2)
Total Protein: 6.6 g/dL (ref 6.0–8.3)

## 2010-11-20 ENCOUNTER — Encounter (HOSPITAL_BASED_OUTPATIENT_CLINIC_OR_DEPARTMENT_OTHER): Payer: Medicare Other | Admitting: Hematology and Oncology

## 2010-11-20 DIAGNOSIS — Z5111 Encounter for antineoplastic chemotherapy: Secondary | ICD-10-CM

## 2010-11-20 DIAGNOSIS — C210 Malignant neoplasm of anus, unspecified: Secondary | ICD-10-CM

## 2010-11-21 ENCOUNTER — Encounter (HOSPITAL_BASED_OUTPATIENT_CLINIC_OR_DEPARTMENT_OTHER): Payer: Medicare Other | Admitting: Hematology and Oncology

## 2010-11-21 ENCOUNTER — Other Ambulatory Visit: Payer: Self-pay | Admitting: Hematology and Oncology

## 2010-11-21 DIAGNOSIS — Z5111 Encounter for antineoplastic chemotherapy: Secondary | ICD-10-CM

## 2010-11-21 DIAGNOSIS — R630 Anorexia: Secondary | ICD-10-CM

## 2010-11-21 DIAGNOSIS — C210 Malignant neoplasm of anus, unspecified: Secondary | ICD-10-CM

## 2010-11-21 LAB — CBC WITH DIFFERENTIAL/PLATELET
BASO%: 0.9 % (ref 0.0–2.0)
Eosinophils Absolute: 0.2 10*3/uL (ref 0.0–0.5)
HCT: 36.6 % — ABNORMAL LOW (ref 38.4–49.9)
HGB: 12.4 g/dL — ABNORMAL LOW (ref 13.0–17.1)
MCH: 32.4 pg (ref 27.2–33.4)
MCHC: 33.9 g/dL (ref 32.0–36.0)
MCV: 95.6 fL (ref 79.3–98.0)
MONO#: 0.6 10*3/uL (ref 0.1–0.9)
Platelets: 275 10*3/uL (ref 140–400)
nRBC: 0 % (ref 0–0)

## 2010-11-25 ENCOUNTER — Encounter (HOSPITAL_BASED_OUTPATIENT_CLINIC_OR_DEPARTMENT_OTHER): Payer: Medicare Other | Admitting: Hematology and Oncology

## 2010-11-25 DIAGNOSIS — C210 Malignant neoplasm of anus, unspecified: Secondary | ICD-10-CM

## 2010-11-26 ENCOUNTER — Encounter (HOSPITAL_BASED_OUTPATIENT_CLINIC_OR_DEPARTMENT_OTHER): Payer: Medicare Other | Admitting: Hematology and Oncology

## 2010-11-26 DIAGNOSIS — C210 Malignant neoplasm of anus, unspecified: Secondary | ICD-10-CM

## 2010-11-27 ENCOUNTER — Encounter (HOSPITAL_BASED_OUTPATIENT_CLINIC_OR_DEPARTMENT_OTHER): Payer: Medicare Other | Admitting: Hematology and Oncology

## 2010-11-27 DIAGNOSIS — C210 Malignant neoplasm of anus, unspecified: Secondary | ICD-10-CM

## 2010-11-28 ENCOUNTER — Encounter (HOSPITAL_BASED_OUTPATIENT_CLINIC_OR_DEPARTMENT_OTHER): Payer: Medicare Other | Admitting: Hematology and Oncology

## 2010-11-28 DIAGNOSIS — C210 Malignant neoplasm of anus, unspecified: Secondary | ICD-10-CM

## 2010-11-29 ENCOUNTER — Encounter (HOSPITAL_BASED_OUTPATIENT_CLINIC_OR_DEPARTMENT_OTHER): Payer: Medicare Other | Admitting: Hematology and Oncology

## 2010-11-29 DIAGNOSIS — C210 Malignant neoplasm of anus, unspecified: Secondary | ICD-10-CM

## 2010-11-30 ENCOUNTER — Encounter (HOSPITAL_BASED_OUTPATIENT_CLINIC_OR_DEPARTMENT_OTHER): Payer: Medicare Other | Admitting: Hematology and Oncology

## 2010-11-30 DIAGNOSIS — C210 Malignant neoplasm of anus, unspecified: Secondary | ICD-10-CM

## 2010-12-14 ENCOUNTER — Other Ambulatory Visit: Payer: Self-pay | Admitting: Hematology and Oncology

## 2010-12-14 ENCOUNTER — Ambulatory Visit (HOSPITAL_COMMUNITY)
Admission: RE | Admit: 2010-12-14 | Discharge: 2010-12-14 | Disposition: A | Discharge status: Home / Self Care | Payer: Medicare Other | Source: Ambulatory Visit | Attending: Hematology and Oncology | Admitting: Hematology and Oncology

## 2010-12-14 ENCOUNTER — Encounter (HOSPITAL_BASED_OUTPATIENT_CLINIC_OR_DEPARTMENT_OTHER): Payer: Medicare Other | Admitting: Hematology and Oncology

## 2010-12-14 DIAGNOSIS — I700 Atherosclerosis of aorta: Secondary | ICD-10-CM | POA: Insufficient documentation

## 2010-12-14 DIAGNOSIS — R911 Solitary pulmonary nodule: Secondary | ICD-10-CM | POA: Insufficient documentation

## 2010-12-14 DIAGNOSIS — Z9221 Personal history of antineoplastic chemotherapy: Secondary | ICD-10-CM | POA: Insufficient documentation

## 2010-12-14 DIAGNOSIS — C210 Malignant neoplasm of anus, unspecified: Secondary | ICD-10-CM

## 2010-12-14 DIAGNOSIS — K8020 Calculus of gallbladder without cholecystitis without obstruction: Secondary | ICD-10-CM | POA: Insufficient documentation

## 2010-12-14 DIAGNOSIS — R630 Anorexia: Secondary | ICD-10-CM

## 2010-12-14 DIAGNOSIS — Z5111 Encounter for antineoplastic chemotherapy: Secondary | ICD-10-CM

## 2010-12-14 DIAGNOSIS — E279 Disorder of adrenal gland, unspecified: Secondary | ICD-10-CM | POA: Insufficient documentation

## 2010-12-14 DIAGNOSIS — C787 Secondary malignant neoplasm of liver and intrahepatic bile duct: Secondary | ICD-10-CM | POA: Insufficient documentation

## 2010-12-14 LAB — CBC WITH DIFFERENTIAL/PLATELET
BASO%: 0.4 % (ref 0.0–2.0)
Basophils Absolute: 0 10*3/uL (ref 0.0–0.1)
HCT: 38.8 % (ref 38.4–49.9)
HGB: 13.4 g/dL (ref 13.0–17.1)
MONO#: 0.5 10*3/uL (ref 0.1–0.9)
NEUT%: 57 % (ref 39.0–75.0)
RDW: 16.1 % — ABNORMAL HIGH (ref 11.0–14.6)
WBC: 5.3 10*3/uL (ref 4.0–10.3)
lymph#: 1.7 10*3/uL (ref 0.9–3.3)

## 2010-12-14 LAB — MAGNESIUM: Magnesium: 1.7 mg/dL (ref 1.5–2.5)

## 2010-12-14 LAB — COMPREHENSIVE METABOLIC PANEL
ALT: 49 U/L (ref 0–53)
AST: 68 U/L — ABNORMAL HIGH (ref 0–37)
Albumin: 4.2 g/dL (ref 3.5–5.2)
BUN: 18 mg/dL (ref 6–23)
CO2: 25 mEq/L (ref 19–32)
Calcium: 9.9 mg/dL (ref 8.4–10.5)
Chloride: 101 mEq/L (ref 96–112)
Creatinine, Ser: 1.21 mg/dL (ref 0.50–1.35)
Potassium: 4.6 mEq/L (ref 3.5–5.3)

## 2010-12-14 MED ORDER — IOHEXOL 300 MG/ML  SOLN
100.0000 mL | Freq: Once | INTRAMUSCULAR | Status: AC | PRN
  Administered 2010-12-14: 100 mL via INTRAVENOUS

## 2010-12-19 ENCOUNTER — Encounter (HOSPITAL_BASED_OUTPATIENT_CLINIC_OR_DEPARTMENT_OTHER): Payer: Medicare Other | Admitting: Hematology and Oncology

## 2010-12-19 ENCOUNTER — Other Ambulatory Visit: Payer: Self-pay | Admitting: Hematology and Oncology

## 2010-12-19 DIAGNOSIS — R630 Anorexia: Secondary | ICD-10-CM

## 2010-12-19 DIAGNOSIS — C210 Malignant neoplasm of anus, unspecified: Secondary | ICD-10-CM

## 2010-12-19 DIAGNOSIS — C787 Secondary malignant neoplasm of liver and intrahepatic bile duct: Secondary | ICD-10-CM

## 2010-12-27 ENCOUNTER — Ambulatory Visit
Admission: RE | Admit: 2010-12-27 | Discharge: 2010-12-27 | Disposition: A | Discharge status: Home / Self Care | Payer: Medicare Other | Source: Ambulatory Visit | Attending: Hematology and Oncology | Admitting: Hematology and Oncology

## 2010-12-27 VITALS — BP 114/72 | HR 89 | Temp 98.1°F | Resp 17 | Ht 69.0 in | Wt 168.0 lb

## 2010-12-27 DIAGNOSIS — C787 Secondary malignant neoplasm of liver and intrahepatic bile duct: Secondary | ICD-10-CM

## 2010-12-28 NOTE — Progress Notes (Signed)
Consult for possible ablation of liver metastasis, seen by Dr Grace Isaac.  Hx.  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   PET on 07/04/2010:   3 cm hypermetabolic lesion in the right dome of the liver.  Liver bx on 08/14/2010.  3 cm hypermetabolic lesion in the right dome of the liver.   Currently being treated w/ 5-FU and cisplatin, treatment started on 09/18/2010.    S/p concurrent radiation & chemo w/ mitomycin & 5-FU between 5/24/-11/04/2008.

## 2011-01-12 ENCOUNTER — Other Ambulatory Visit (HOSPITAL_COMMUNITY): Payer: Self-pay | Admitting: Interventional Radiology

## 2011-01-12 DIAGNOSIS — C210 Malignant neoplasm of anus, unspecified: Secondary | ICD-10-CM

## 2011-01-12 DIAGNOSIS — K769 Liver disease, unspecified: Secondary | ICD-10-CM

## 2011-01-24 ENCOUNTER — Encounter (HOSPITAL_BASED_OUTPATIENT_CLINIC_OR_DEPARTMENT_OTHER): Payer: Medicare Other | Admitting: Hematology and Oncology

## 2011-01-24 DIAGNOSIS — C210 Malignant neoplasm of anus, unspecified: Secondary | ICD-10-CM

## 2011-02-07 ENCOUNTER — Other Ambulatory Visit: Payer: Self-pay | Admitting: Interventional Radiology

## 2011-02-07 ENCOUNTER — Encounter (HOSPITAL_COMMUNITY): Payer: Medicare Other

## 2011-02-07 LAB — COMPREHENSIVE METABOLIC PANEL
ALT: 50 U/L (ref 0–53)
AST: 63 U/L — ABNORMAL HIGH (ref 0–37)
Albumin: 3.4 g/dL — ABNORMAL LOW (ref 3.5–5.2)
Alkaline Phosphatase: 94 U/L (ref 39–117)
BUN: 11 mg/dL (ref 6–23)
Chloride: 101 mEq/L (ref 96–112)
Potassium: 4.7 mEq/L (ref 3.5–5.1)
Sodium: 137 mEq/L (ref 135–145)
Total Bilirubin: 0.3 mg/dL (ref 0.3–1.2)
Total Protein: 7.4 g/dL (ref 6.0–8.3)

## 2011-02-07 LAB — CBC
HCT: 42.5 % (ref 39.0–52.0)
Hemoglobin: 14.4 g/dL (ref 13.0–17.0)
RBC: 4.32 MIL/uL (ref 4.22–5.81)
WBC: 5.6 10*3/uL (ref 4.0–10.5)

## 2011-02-07 LAB — SURGICAL PCR SCREEN
MRSA, PCR: NEGATIVE
Staphylococcus aureus: NEGATIVE

## 2011-02-07 LAB — PROTIME-INR
INR: 0.95 (ref 0.00–1.49)
Prothrombin Time: 12.9 seconds (ref 11.6–15.2)

## 2011-02-07 LAB — APTT: aPTT: 32 seconds (ref 24–37)

## 2011-02-07 LAB — CROSSMATCH

## 2011-02-07 LAB — ABO/RH: ABO/RH(D): O POS

## 2011-02-09 ENCOUNTER — Ambulatory Visit (HOSPITAL_COMMUNITY)
Admission: RE | Admit: 2011-02-09 | Discharge: 2011-02-09 | Disposition: A | Discharge status: Home / Self Care | Payer: Medicare Other | Source: Ambulatory Visit | Attending: Interventional Radiology | Admitting: Interventional Radiology

## 2011-02-09 ENCOUNTER — Observation Stay (HOSPITAL_COMMUNITY)
Admission: RE | Admit: 2011-02-09 | Discharge: 2011-02-10 | Disposition: A | Discharge status: Home / Self Care | Payer: Medicare Other | Source: Ambulatory Visit | Attending: Interventional Radiology | Admitting: Interventional Radiology

## 2011-02-09 DIAGNOSIS — K7689 Other specified diseases of liver: Secondary | ICD-10-CM | POA: Insufficient documentation

## 2011-02-09 DIAGNOSIS — Z01812 Encounter for preprocedural laboratory examination: Secondary | ICD-10-CM | POA: Insufficient documentation

## 2011-02-09 DIAGNOSIS — K769 Liver disease, unspecified: Secondary | ICD-10-CM

## 2011-02-09 DIAGNOSIS — F17200 Nicotine dependence, unspecified, uncomplicated: Secondary | ICD-10-CM | POA: Insufficient documentation

## 2011-02-09 DIAGNOSIS — Z79899 Other long term (current) drug therapy: Secondary | ICD-10-CM | POA: Insufficient documentation

## 2011-02-09 DIAGNOSIS — C210 Malignant neoplasm of anus, unspecified: Principal | ICD-10-CM | POA: Insufficient documentation

## 2011-02-09 DIAGNOSIS — I10 Essential (primary) hypertension: Secondary | ICD-10-CM | POA: Insufficient documentation

## 2011-02-09 DIAGNOSIS — C787 Secondary malignant neoplasm of liver and intrahepatic bile duct: Secondary | ICD-10-CM | POA: Insufficient documentation

## 2011-02-09 DIAGNOSIS — E119 Type 2 diabetes mellitus without complications: Secondary | ICD-10-CM | POA: Insufficient documentation

## 2011-02-09 LAB — GLUCOSE, CAPILLARY

## 2011-02-10 LAB — COMPREHENSIVE METABOLIC PANEL
Alkaline Phosphatase: 67 U/L (ref 39–117)
BUN: 11 mg/dL (ref 6–23)
Chloride: 104 mEq/L (ref 96–112)
Creatinine, Ser: 0.87 mg/dL (ref 0.50–1.35)
GFR calc Af Amer: >90 mL/min (ref >90)
Glucose, Bld: 134 mg/dL — ABNORMAL HIGH (ref 70–99)
Potassium: 4.2 mEq/L (ref 3.5–5.1)
Total Bilirubin: 0.2 mg/dL — ABNORMAL LOW (ref 0.3–1.2)

## 2011-02-10 LAB — CBC
HCT: 32.8 % — ABNORMAL LOW (ref 39.0–52.0)
Hemoglobin: 10.8 g/dL — ABNORMAL LOW (ref 13.0–17.0)
MCHC: 32.9 g/dL (ref 30.0–36.0)
MCV: 99.7 fL (ref 78.0–100.0)
WBC: 4.6 10*3/uL (ref 4.0–10.5)

## 2011-02-10 LAB — GLUCOSE, CAPILLARY
Glucose-Capillary: 132 mg/dL — ABNORMAL HIGH (ref 70–99)
Glucose-Capillary: 146 mg/dL — ABNORMAL HIGH (ref 70–99)

## 2011-02-12 ENCOUNTER — Other Ambulatory Visit (HOSPITAL_COMMUNITY): Payer: Self-pay | Admitting: Interventional Radiology

## 2011-02-12 ENCOUNTER — Other Ambulatory Visit: Payer: Self-pay | Admitting: Interventional Radiology

## 2011-02-12 DIAGNOSIS — C787 Secondary malignant neoplasm of liver and intrahepatic bile duct: Secondary | ICD-10-CM

## 2011-02-12 LAB — BASIC METABOLIC PANEL
BUN: 11
Calcium: 8.9
Creatinine, Ser: 1.02
GFR calc non Af Amer: >60
Glucose, Bld: 100 — ABNORMAL HIGH

## 2011-02-12 LAB — CBC
Platelets: 268
RDW: 13.1

## 2011-02-13 LAB — COMPREHENSIVE METABOLIC PANEL
AST: 34
Albumin: 3.7
Calcium: 9.6
Chloride: 107
Creatinine, Ser: 0.98
GFR calc Af Amer: >60
Total Bilirubin: 0.5

## 2011-02-13 LAB — URINALYSIS, ROUTINE W REFLEX MICROSCOPIC
Nitrite: NEGATIVE
Specific Gravity, Urine: 1.014
pH: 7

## 2011-02-13 LAB — CBC
MCV: 92.6
Platelets: 300
WBC: 6.8

## 2011-02-13 LAB — BLOOD GAS, ARTERIAL
Acid-base deficit: 0.5
FIO2: 0.21
pCO2 arterial: 37.6
pH, Arterial: 7.412

## 2011-02-13 LAB — TYPE AND SCREEN
ABO/RH(D): O POS
Antibody Screen: NEGATIVE

## 2011-02-13 LAB — APTT: aPTT: 31

## 2011-02-26 ENCOUNTER — Other Ambulatory Visit (HOSPITAL_COMMUNITY): Payer: Self-pay | Admitting: Hematology and Oncology

## 2011-02-26 DIAGNOSIS — C210 Malignant neoplasm of anus, unspecified: Secondary | ICD-10-CM | POA: Insufficient documentation

## 2011-03-07 ENCOUNTER — Ambulatory Visit (HOSPITAL_BASED_OUTPATIENT_CLINIC_OR_DEPARTMENT_OTHER): Payer: Medicare Other

## 2011-03-07 VITALS — BP 137/75 | HR 92 | Temp 98.2°F

## 2011-03-07 DIAGNOSIS — C210 Malignant neoplasm of anus, unspecified: Secondary | ICD-10-CM

## 2011-03-07 DIAGNOSIS — Z452 Encounter for adjustment and management of vascular access device: Secondary | ICD-10-CM

## 2011-03-07 MED ORDER — HEPARIN SOD (PORK) LOCK FLUSH 100 UNIT/ML IV SOLN
500.0000 [IU] | Freq: Once | INTRAVENOUS | Status: AC | PRN
  Administered 2011-03-07: 500 [IU] via INTRAVENOUS
  Filled 2011-03-07: qty 5

## 2011-03-07 MED ORDER — SODIUM CHLORIDE 0.9 % IJ SOLN
10.0000 mL | INTRAMUSCULAR | Status: DC | PRN
  Administered 2011-03-07: 10 mL via INTRAVENOUS
  Filled 2011-03-07: qty 10

## 2011-03-09 ENCOUNTER — Other Ambulatory Visit (HOSPITAL_COMMUNITY): Payer: Medicare Other

## 2011-03-09 ENCOUNTER — Ambulatory Visit (HOSPITAL_COMMUNITY)
Admission: RE | Admit: 2011-03-09 | Discharge: 2011-03-09 | Disposition: A | Discharge status: Home / Self Care | Payer: Medicare Other | Source: Ambulatory Visit | Attending: Hematology and Oncology | Admitting: Hematology and Oncology

## 2011-03-09 DIAGNOSIS — I700 Atherosclerosis of aorta: Secondary | ICD-10-CM | POA: Insufficient documentation

## 2011-03-09 DIAGNOSIS — C801 Malignant (primary) neoplasm, unspecified: Secondary | ICD-10-CM | POA: Insufficient documentation

## 2011-03-09 DIAGNOSIS — K8020 Calculus of gallbladder without cholecystitis without obstruction: Secondary | ICD-10-CM | POA: Insufficient documentation

## 2011-03-09 DIAGNOSIS — C210 Malignant neoplasm of anus, unspecified: Secondary | ICD-10-CM | POA: Insufficient documentation

## 2011-03-09 DIAGNOSIS — I723 Aneurysm of iliac artery: Secondary | ICD-10-CM | POA: Insufficient documentation

## 2011-03-09 MED ORDER — IOHEXOL 300 MG/ML  SOLN
100.0000 mL | Freq: Once | INTRAMUSCULAR | Status: AC | PRN
  Administered 2011-03-09: 100 mL via INTRAVENOUS

## 2011-03-13 ENCOUNTER — Other Ambulatory Visit: Payer: Self-pay | Admitting: *Deleted

## 2011-03-13 ENCOUNTER — Telehealth: Payer: Self-pay | Admitting: Emergency Medicine

## 2011-03-13 ENCOUNTER — Ambulatory Visit
Admission: RE | Admit: 2011-03-13 | Discharge: 2011-03-13 | Disposition: A | Discharge status: Home / Self Care | Payer: Medicare Other | Source: Ambulatory Visit | Attending: Interventional Radiology | Admitting: Interventional Radiology

## 2011-03-13 VITALS — BP 135/72 | HR 98 | Temp 98.4°F | Resp 14

## 2011-03-13 DIAGNOSIS — C787 Secondary malignant neoplasm of liver and intrahepatic bile duct: Secondary | ICD-10-CM

## 2011-03-13 DIAGNOSIS — C785 Secondary malignant neoplasm of large intestine and rectum: Secondary | ICD-10-CM

## 2011-03-13 NOTE — Progress Notes (Signed)
PT DENIES PAIN, N & V,  FAIR APPETITE, NO COMPLAINTS TO REPORT AT THIS  TIME. PT WILL BE SEEING DR ODOGWU 03-14-11 CT IN [facility] FROM 03-09-11

## 2011-03-13 NOTE — Telephone Encounter (Signed)
LMOVM REQUESTING THAT DR ODOGWU PLEASE OBTAIN CEA LEVEL WITH PTS. LABS TOMORROW.

## 2011-03-14 ENCOUNTER — Other Ambulatory Visit: Payer: Self-pay | Admitting: Hematology and Oncology

## 2011-03-14 ENCOUNTER — Other Ambulatory Visit (HOSPITAL_BASED_OUTPATIENT_CLINIC_OR_DEPARTMENT_OTHER): Payer: Medicare Other

## 2011-03-14 ENCOUNTER — Ambulatory Visit (HOSPITAL_BASED_OUTPATIENT_CLINIC_OR_DEPARTMENT_OTHER): Payer: Medicare Other | Admitting: Physician Assistant

## 2011-03-14 VITALS — BP 135/77 | HR 99 | Temp 97.3°F | Ht 69.0 in | Wt 173.1 lb

## 2011-03-14 DIAGNOSIS — C787 Secondary malignant neoplasm of liver and intrahepatic bile duct: Secondary | ICD-10-CM

## 2011-03-14 DIAGNOSIS — C785 Secondary malignant neoplasm of large intestine and rectum: Secondary | ICD-10-CM

## 2011-03-14 DIAGNOSIS — C210 Malignant neoplasm of anus, unspecified: Secondary | ICD-10-CM

## 2011-03-14 LAB — COMPREHENSIVE METABOLIC PANEL
AST: 61 U/L — ABNORMAL HIGH (ref 0–37)
Albumin: 4.3 g/dL (ref 3.5–5.2)
Alkaline Phosphatase: 88 U/L (ref 39–117)
Potassium: 4.5 mEq/L (ref 3.5–5.3)
Sodium: 136 mEq/L (ref 135–145)
Total Bilirubin: 0.4 mg/dL (ref 0.3–1.2)
Total Protein: 6.6 g/dL (ref 6.0–8.3)

## 2011-03-14 LAB — CBC WITH DIFFERENTIAL/PLATELET
BASO%: 0.3 % (ref 0.0–2.0)
EOS%: 3.2 % (ref 0.0–7.0)
MCH: 33.5 pg — ABNORMAL HIGH (ref 27.2–33.4)
MCHC: 34.5 g/dL (ref 32.0–36.0)
MCV: 97 fL (ref 79.3–98.0)
MONO%: 7.8 % (ref 0.0–14.0)
NEUT#: 3.6 10*3/uL (ref 1.5–6.5)
RBC: 4.08 10*6/uL — ABNORMAL LOW (ref 4.20–5.82)
RDW: 12.8 % (ref 11.0–14.6)

## 2011-03-14 NOTE — Progress Notes (Signed)
CC:   Wilhemina Bonito. Marina Goodell, MD Clovis Pu. Cornett, M.D. Annia Friendly. Loleta Chance, MD Simonne Come, MD  IDENTIFYING STATEMENT:  [name] is a 65 year old white male with recurrent anal cancer with liver metastasis who presents for followup.  INTERIM HISTORY:  [name] reports that since his last clinic visit in August of 2012 that he underwent percutaneous microwave ablation of a liver lesion in the dome of the right lobe of the liver under the direction of Dr. Simonne Come, on February 09, 2011.  He states his last followup appointment with Dr. Grace Isaac was yesterday.  Currently, he reports some intermittent fatigue but no difficulty completing ADLs.  No fevers, chills, or night sweats.  No dyspnea or cough.  He has a normal appetite, and has had no problems with nausea, vomiting, constipation, or diarrhea.  No dysuria, frequency, or hematuria.  No pain or tenderness at his Port-A-Cath site which he states was last flushed on November 9th when he had his CT scan.  CURRENT MEDICATIONS:  Reviewed and recorded.  PHYSICAL EXAMINATION:  Temperature is 97.3, heart rate 99, respirations 20, blood pressure 135/75, weight 173.1 pounds.  In general, this is a well-developed, well-nourished, white male in no acute distress.  HEENT: Sclerae nonicteric.  There is no thrush or mucositis.  Skin reveals some ecchymotic areas noted on his extremities.  No rashes or lesions. Lymph:  No cervical, supraclavicular, axillary, or inguinal lymphadenopathy.  Cardiac:  Regular rate and rhythm without murmurs or gallops.  Peripheral pulses are 2+.  There is a right subclavian Port-A- Cath without signs of infection.  Chest:  Lungs clear to auscultation. Abdomen:  Positive bowel sounds.  Soft, nontender, nondistended.  No organomegaly.  Extremities reveal dependent lower extremity edema without cyanosis or calf tenderness.  Neuro:  Alert and oriented times 3, with left-sided hemiparesis.  LABORATORY DATA:  CBC with  diff reveals a white blood count of 5.4, hemoglobin 13.6, hematocrit 39.5, platelets of 260, ANC of 3.6 and MCV of 97.  CMET is currently pending.  RADIOGRAPHIC STUDIES:  CT of the chest, abdomen and pelvis with contrast from March 09, 2011:  In the chest, no specific features to suggest metastatic disease to the chest.  In the abdomen, mild decrease in size of the ablation site involving the dome of the right hepatic lobe.  No new liver abnormalities identified.  In the pelvis, no specific features identified to suggest residual or recurrent tumor within the pelvis.  IMPRESSION/PLAN: 1. Mr.  Donald [name] is a 65 year old white male with metastatic     poorly differentiated carcinoma of the anus with solitary     metastasis of liver.  He is status post 3 cycles of 5-FU and     cisplatin between Sep 18, 2010 and November 20, 2010, and despite dose     reduction he had toxicities, primarily failure to thrive and     mucositis.  He therefore was not keen to proceed with additional     chemotherapy.  The patient was subsequently referred to     Interventional Radiology and underwent percutaneous microwave     ablation of a right lobe liver lesion on February 09, 2011 under the     direction of Dr. Simonne Come.  Current postprocedure radiographic     studies are as above.  These have been reviewed by Dr. Dalene Carrow and     are reviewed the patient today.  Per Dr. Dalene Carrow, the patient will  be scheduled for followup in 3 months' time.  A few days before     this, we will reassess CBC with diff, CMET and a PET scan.  The     patient will also have Port-A-Cath flush at that time of his PET     scan.  He is advised to call in the interim if any questions or     problems. 2. The patient will also have a Port-A-Cath flush 6 weeks from today's     visit.  He is advised to call prior to his followup appointment if     any questions or problems, and he verbalized  understanding.    ______________________________ Sherilyn Banker, MSN, ANP, BC RJ/MEDQ  D:  03/14/2011  T:  03/14/2011  Job:  [id number]

## 2011-03-14 NOTE — Progress Notes (Signed)
This office note has been dictated.

## 2011-04-09 ENCOUNTER — Other Ambulatory Visit (HOSPITAL_COMMUNITY): Payer: Medicare Other

## 2011-04-09 ENCOUNTER — Other Ambulatory Visit: Payer: Medicare Other | Admitting: Lab

## 2011-04-11 ENCOUNTER — Ambulatory Visit: Payer: Medicare Other | Admitting: Physician Assistant

## 2011-04-18 ENCOUNTER — Ambulatory Visit (HOSPITAL_BASED_OUTPATIENT_CLINIC_OR_DEPARTMENT_OTHER): Payer: Medicare Other

## 2011-04-18 VITALS — BP 128/73 | HR 87 | Temp 97.4°F

## 2011-04-18 DIAGNOSIS — C210 Malignant neoplasm of anus, unspecified: Secondary | ICD-10-CM

## 2011-04-18 DIAGNOSIS — Z469 Encounter for fitting and adjustment of unspecified device: Secondary | ICD-10-CM

## 2011-04-18 MED ORDER — HEPARIN SOD (PORK) LOCK FLUSH 100 UNIT/ML IV SOLN
500.0000 [IU] | Freq: Once | INTRAVENOUS | Status: AC | PRN
  Administered 2011-04-18: 500 [IU] via INTRAVENOUS
  Filled 2011-04-18: qty 5

## 2011-04-18 MED ORDER — SODIUM CHLORIDE 0.9 % IJ SOLN
10.0000 mL | INTRAMUSCULAR | Status: DC | PRN
  Administered 2011-04-18: 10 mL via INTRAVENOUS
  Filled 2011-04-18: qty 10

## 2011-04-25 ENCOUNTER — Telehealth: Payer: Self-pay | Admitting: Interventional Radiology

## 2011-05-03 ENCOUNTER — Encounter (INDEPENDENT_AMBULATORY_CARE_PROVIDER_SITE_OTHER): Payer: Self-pay | Admitting: Surgery

## 2011-05-03 ENCOUNTER — Ambulatory Visit (INDEPENDENT_AMBULATORY_CARE_PROVIDER_SITE_OTHER): Payer: Medicare Other | Admitting: Surgery

## 2011-05-03 VITALS — BP 150/62 | HR 102 | Temp 97.8°F | Ht 69.0 in | Wt 177.4 lb

## 2011-05-03 DIAGNOSIS — C210 Malignant neoplasm of anus, unspecified: Secondary | ICD-10-CM

## 2011-05-03 NOTE — Patient Instructions (Signed)
Follow up in 6 months 

## 2011-05-03 NOTE — Progress Notes (Signed)
Subjective:     Patient ID: IRAM LUNDBERG, male   DOB: [birth year], 66 y.o.   MRN: [id number]  HPI The patient returns in followup in 2 to stage IV anal carcinoma. He had a liver lesion biopsied in June of 2012 consistent with metastatic anal carcinoma. She underwent ablation of this a [facility] and chemotherapy. He is doing well with no complaints.  Review of Systems  Constitutional: Negative.   HENT: Negative.   Respiratory: Negative.   Gastrointestinal: Positive for blood in stool and rectal pain.       Objective:   Physical Exam  Constitutional: He appears well-developed and well-nourished.  Cardiovascular: Normal rate and regular rhythm.   Pulmonary/Chest: Effort normal and breath sounds normal.  Abdominal: Soft. Bowel sounds are normal.  Genitourinary:       Atrophic anal canal. No mass in anal canal or distal rectum or stricture.       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 The one metastases in stable. His anal canal shows no evidence recurrence. Return to clinic 6 months.

## 2011-05-30 ENCOUNTER — Ambulatory Visit (HOSPITAL_BASED_OUTPATIENT_CLINIC_OR_DEPARTMENT_OTHER): Payer: Medicare Other

## 2011-05-30 VITALS — BP 154/85 | HR 92 | Temp 98.2°F

## 2011-05-30 DIAGNOSIS — C210 Malignant neoplasm of anus, unspecified: Secondary | ICD-10-CM

## 2011-05-30 MED ORDER — SODIUM CHLORIDE 0.9 % IJ SOLN
10.0000 mL | INTRAMUSCULAR | Status: DC | PRN
  Administered 2011-05-30: 10 mL via INTRAVENOUS
  Filled 2011-05-30: qty 10

## 2011-05-30 MED ORDER — HEPARIN SOD (PORK) LOCK FLUSH 100 UNIT/ML IV SOLN
500.0000 [IU] | Freq: Once | INTRAVENOUS | Status: AC | PRN
  Administered 2011-05-30: 500 [IU] via INTRAVENOUS
  Filled 2011-05-30: qty 5

## 2011-06-13 ENCOUNTER — Ambulatory Visit: Payer: Medicare Other | Admitting: Hematology and Oncology

## 2011-06-13 ENCOUNTER — Encounter (HOSPITAL_COMMUNITY)
Admission: RE | Admit: 2011-06-13 | Discharge: 2011-06-13 | Disposition: A | Discharge status: Home / Self Care | Payer: Medicare Other | Source: Ambulatory Visit | Attending: Physician Assistant | Admitting: Physician Assistant

## 2011-06-13 ENCOUNTER — Other Ambulatory Visit (HOSPITAL_BASED_OUTPATIENT_CLINIC_OR_DEPARTMENT_OTHER): Payer: Medicare Other | Admitting: Lab

## 2011-06-13 DIAGNOSIS — R222 Localized swelling, mass and lump, trunk: Secondary | ICD-10-CM | POA: Insufficient documentation

## 2011-06-13 DIAGNOSIS — K8020 Calculus of gallbladder without cholecystitis without obstruction: Secondary | ICD-10-CM | POA: Insufficient documentation

## 2011-06-13 DIAGNOSIS — C210 Malignant neoplasm of anus, unspecified: Secondary | ICD-10-CM | POA: Insufficient documentation

## 2011-06-13 LAB — CBC WITH DIFFERENTIAL/PLATELET
BASO%: 0.4 % (ref 0.0–2.0)
Eosinophils Absolute: 0.2 10*3/uL (ref 0.0–0.5)
HCT: 35.5 % — ABNORMAL LOW (ref 38.4–49.9)
LYMPH%: 25.8 % (ref 14.0–49.0)
MCHC: 33.8 g/dL (ref 32.0–36.0)
MCV: 93.9 fL (ref 79.3–98.0)
MONO%: 10.9 % (ref 0.0–14.0)
NEUT%: 59.7 % (ref 39.0–75.0)
Platelets: 265 10*3/uL (ref 140–400)
RBC: 3.78 10*6/uL — ABNORMAL LOW (ref 4.20–5.82)

## 2011-06-13 LAB — COMPREHENSIVE METABOLIC PANEL
Alkaline Phosphatase: 83 U/L (ref 39–117)
CO2: 24 mEq/L (ref 19–32)
Creatinine, Ser: 1.11 mg/dL (ref 0.50–1.35)
Glucose, Bld: 193 mg/dL — ABNORMAL HIGH (ref 70–99)
Sodium: 140 mEq/L (ref 135–145)
Total Bilirubin: 0.4 mg/dL (ref 0.3–1.2)
Total Protein: 6.8 g/dL (ref 6.0–8.3)

## 2011-06-13 LAB — GLUCOSE, CAPILLARY: Glucose-Capillary: 197 mg/dL — ABNORMAL HIGH (ref 70–99)

## 2011-06-13 MED ORDER — FLUDEOXYGLUCOSE F - 18 (FDG) INJECTION
19.5000 | Freq: Once | INTRAVENOUS | Status: AC | PRN
  Administered 2011-06-13: 19.5 via INTRAVENOUS

## 2011-06-19 ENCOUNTER — Ambulatory Visit (HOSPITAL_BASED_OUTPATIENT_CLINIC_OR_DEPARTMENT_OTHER): Payer: Medicare Other | Admitting: Hematology and Oncology

## 2011-06-19 VITALS — BP 143/71 | HR 97 | Temp 97.3°F | Ht 69.0 in | Wt 183.1 lb

## 2011-06-19 DIAGNOSIS — C210 Malignant neoplasm of anus, unspecified: Secondary | ICD-10-CM

## 2011-06-19 DIAGNOSIS — C787 Secondary malignant neoplasm of liver and intrahepatic bile duct: Secondary | ICD-10-CM

## 2011-06-19 MED ORDER — HEPARIN SOD (PORK) LOCK FLUSH 100 UNIT/ML IV SOLN
500.0000 [IU] | Freq: Once | INTRAVENOUS | Status: DC
  Filled 2011-06-19: qty 5

## 2011-06-19 MED ORDER — SODIUM CHLORIDE 0.9 % IJ SOLN
10.0000 mL | INTRAMUSCULAR | Status: DC | PRN
  Filled 2011-06-19: qty 10

## 2011-06-19 MED ORDER — ALTEPLASE 2 MG IJ SOLR
2.0000 mg | Freq: Once | INTRAMUSCULAR | Status: DC | PRN
  Filled 2011-06-19: qty 2

## 2011-06-19 NOTE — Progress Notes (Signed)
CC:   Annia Friendly. Loleta Chance, MD Clovis Pu. Cornett, M.D. Wilhemina Bonito. Marina Goodell, MD Simonne Come, MD  IDENTIFYING STATEMENT:  The patient is a 66 year old man with recurrent anal cancer with liver metastases who presents for followup.  INTERVAL HISTORY:  Mr. Shall was last seen 3 months ago.  He is having some issues with his left leg.  He is seeing an orthopedic doctor for this.  He is prone to constipation.  He is occasionally reliant on stool softeners.  Otherwise, his weight is stable.  He denies abdominal pain, nausea, and vomiting.  He received a restaging PET scan on 04/11/2012.  PET scan showed no evidence of anal carcinoma recurrence or metastases.  MEDICATIONS:  Reviewed and updated.  ALLERGIES:  "Muscle relaxant."  Past medical history, family history, social history unchanged.  REVIEW OF SYSTEMS:  As above.  Rest of review of systems negative.  PHYSICAL EXAM:  The patient is a well-appearing, well-nourished man in no distress.  Vitals:  Pulse 97, blood pressure 143/71, temperature 97.2, respirations 18, weight 183 pounds.  HEENT:  Head is atraumatic, normocephalic.  Sclerae anicteric.  Mouth moist.  Neck:  Supple.  Chest: Clear to percussion.  CVS:  First and second heart sounds present. Port:  No sign of infection.  Abdomen:  Soft, nontender.  Bowel sounds present.  Extremities:  Positive edema.  CNS:  Alert and oriented x2 with left-sided hemiparesis.  Lymph nodes:  No adenopathy.  LAB DATA:  On 06/13/2011, white cell count 5.6, hemoglobin 12, hematocrit 35.5, platelets 265.  Sodium 140, potassium 4.6, chloride 106, CO2 24, BUN 13, creatinine 1.1, glucose 193, total bilirubin 0.4, alkaline phosphatase 83, AST 39, ALT 36.  PET scan as in interval history.  IMPRESSION AND PLAN:  Mr. Nydam is a 66 year old man with metastatic poorly differentiated carcinoma of the anus with solitary metastasis of the liver.  He is status post 3 cycles of 5-FU with cisplatin between Sep 18, 2010 and November 20, 2010 requiring dose reduction.  He also had significant toxicity and was not keen to proceed with additional chemotherapy.  The patient then went on to receive a percutaneous microwave ablation of the right lobe liver lesion February 09, 2011.  His current PET scan and labs indicate no evidence of recurrence.  From my standpoint, he follows up in 4 months' time with a CT scan of the abdomen and pelvis.  He also needs to address a colonoscopy at that visit.  He requires a port flush every 6 weeks.    ______________________________ Laurice Record, M.D. LIO/MEDQ  D:  06/19/2011  T:  06/19/2011  Job:  [id number]

## 2011-06-19 NOTE — Progress Notes (Signed)
This office note has been dictated.

## 2011-06-20 ENCOUNTER — Telehealth: Payer: Self-pay | Admitting: Emergency Medicine

## 2011-06-20 NOTE — Telephone Encounter (Signed)
ERROR

## 2011-07-10 ENCOUNTER — Other Ambulatory Visit: Payer: Self-pay | Admitting: Interventional Radiology

## 2011-07-10 DIAGNOSIS — C787 Secondary malignant neoplasm of liver and intrahepatic bile duct: Secondary | ICD-10-CM

## 2011-07-10 DIAGNOSIS — C210 Malignant neoplasm of anus, unspecified: Secondary | ICD-10-CM

## 2011-07-18 ENCOUNTER — Ambulatory Visit (HOSPITAL_BASED_OUTPATIENT_CLINIC_OR_DEPARTMENT_OTHER): Payer: Medicare Other

## 2011-07-18 VITALS — BP 165/77 | HR 100 | Temp 97.7°F

## 2011-07-18 DIAGNOSIS — C210 Malignant neoplasm of anus, unspecified: Secondary | ICD-10-CM

## 2011-07-18 DIAGNOSIS — C787 Secondary malignant neoplasm of liver and intrahepatic bile duct: Secondary | ICD-10-CM

## 2011-07-18 DIAGNOSIS — Z452 Encounter for adjustment and management of vascular access device: Secondary | ICD-10-CM

## 2011-07-18 MED ORDER — HEPARIN SOD (PORK) LOCK FLUSH 100 UNIT/ML IV SOLN
500.0000 [IU] | Freq: Once | INTRAVENOUS | Status: AC | PRN
  Administered 2011-07-18: 500 [IU] via INTRAVENOUS
  Filled 2011-07-18: qty 5

## 2011-07-18 MED ORDER — SODIUM CHLORIDE 0.9 % IJ SOLN
10.0000 mL | INTRAMUSCULAR | Status: DC | PRN
  Administered 2011-07-18: 10 mL via INTRAVENOUS
  Filled 2011-07-18: qty 10

## 2011-08-29 ENCOUNTER — Ambulatory Visit (HOSPITAL_BASED_OUTPATIENT_CLINIC_OR_DEPARTMENT_OTHER): Payer: Medicare Other

## 2011-08-29 VITALS — BP 143/73 | HR 104 | Temp 97.8°F

## 2011-08-29 DIAGNOSIS — C210 Malignant neoplasm of anus, unspecified: Secondary | ICD-10-CM

## 2011-08-29 MED ORDER — HEPARIN SOD (PORK) LOCK FLUSH 100 UNIT/ML IV SOLN
500.0000 [IU] | Freq: Once | INTRAVENOUS | Status: AC | PRN
  Administered 2011-08-29: 500 [IU] via INTRAVENOUS
  Filled 2011-08-29: qty 5

## 2011-08-29 MED ORDER — SODIUM CHLORIDE 0.9 % IJ SOLN
10.0000 mL | INTRAMUSCULAR | Status: DC | PRN
  Administered 2011-08-29: 10 mL via INTRAVENOUS
  Filled 2011-08-29: qty 10

## 2011-10-10 ENCOUNTER — Ambulatory Visit (HOSPITAL_BASED_OUTPATIENT_CLINIC_OR_DEPARTMENT_OTHER): Payer: PRIVATE HEALTH INSURANCE

## 2011-10-10 VITALS — BP 173/73 | HR 98 | Temp 98.2°F

## 2011-10-10 DIAGNOSIS — C210 Malignant neoplasm of anus, unspecified: Secondary | ICD-10-CM

## 2011-10-10 MED ORDER — SODIUM CHLORIDE 0.9 % IJ SOLN
10.0000 mL | INTRAMUSCULAR | Status: DC | PRN
  Administered 2011-10-10: 10 mL via INTRAVENOUS
  Filled 2011-10-10: qty 10

## 2011-10-10 MED ORDER — HEPARIN SOD (PORK) LOCK FLUSH 100 UNIT/ML IV SOLN
500.0000 [IU] | Freq: Once | INTRAVENOUS | Status: AC | PRN
  Administered 2011-10-10: 500 [IU] via INTRAVENOUS
  Filled 2011-10-10: qty 5

## 2011-10-12 ENCOUNTER — Telehealth: Payer: Self-pay | Admitting: Nurse Practitioner

## 2011-10-12 ENCOUNTER — Ambulatory Visit (HOSPITAL_COMMUNITY)
Admission: RE | Admit: 2011-10-12 | Discharge: 2011-10-12 | Disposition: A | Discharge status: Home / Self Care | Payer: Medicare Other | Source: Ambulatory Visit | Attending: Hematology and Oncology | Admitting: Hematology and Oncology

## 2011-10-12 ENCOUNTER — Other Ambulatory Visit: Payer: Self-pay | Admitting: Hematology and Oncology

## 2011-10-12 ENCOUNTER — Other Ambulatory Visit (HOSPITAL_BASED_OUTPATIENT_CLINIC_OR_DEPARTMENT_OTHER): Payer: Medicare Other | Admitting: Lab

## 2011-10-12 ENCOUNTER — Encounter (HOSPITAL_COMMUNITY): Payer: Self-pay

## 2011-10-12 DIAGNOSIS — C787 Secondary malignant neoplasm of liver and intrahepatic bile duct: Secondary | ICD-10-CM | POA: Insufficient documentation

## 2011-10-12 DIAGNOSIS — K8020 Calculus of gallbladder without cholecystitis without obstruction: Secondary | ICD-10-CM | POA: Insufficient documentation

## 2011-10-12 DIAGNOSIS — Z923 Personal history of irradiation: Secondary | ICD-10-CM | POA: Insufficient documentation

## 2011-10-12 DIAGNOSIS — C210 Malignant neoplasm of anus, unspecified: Secondary | ICD-10-CM | POA: Insufficient documentation

## 2011-10-12 DIAGNOSIS — Z9221 Personal history of antineoplastic chemotherapy: Secondary | ICD-10-CM | POA: Insufficient documentation

## 2011-10-12 HISTORY — DX: Secondary malignant neoplasm of liver and intrahepatic bile duct: C78.7

## 2011-10-12 LAB — CBC WITH DIFFERENTIAL/PLATELET
BASO%: 0.9 % (ref 0.0–2.0)
Basophils Absolute: 0 10*3/uL (ref 0.0–0.1)
EOS%: 2.6 % (ref 0.0–7.0)
HGB: 11.3 g/dL — ABNORMAL LOW (ref 13.0–17.1)
MCH: 28.2 pg (ref 27.2–33.4)
MCHC: 32.6 g/dL (ref 32.0–36.0)
MCV: 86.3 fL (ref 79.3–98.0)
MONO%: 9.4 % (ref 0.0–14.0)
NEUT%: 64.9 % (ref 39.0–75.0)
RDW: 15.8 % — ABNORMAL HIGH (ref 11.0–14.6)

## 2011-10-12 LAB — CMP (CANCER CENTER ONLY)
AST: 51 U/L — ABNORMAL HIGH (ref 11–38)
Albumin: 3.3 g/dL (ref 3.3–5.5)
Alkaline Phosphatase: 90 U/L — ABNORMAL HIGH (ref 26–84)
Potassium: 5.2 mEq/L — ABNORMAL HIGH (ref 3.3–4.7)
Sodium: 147 mEq/L — ABNORMAL HIGH (ref 128–145)
Total Protein: 7.6 g/dL (ref 6.4–8.1)

## 2011-10-12 MED ORDER — IOHEXOL 300 MG/ML  SOLN
100.0000 mL | Freq: Once | INTRAMUSCULAR | Status: AC | PRN
  Administered 2011-10-12: 100 mL via INTRAVENOUS

## 2011-10-12 NOTE — Telephone Encounter (Signed)
Informed pt wife that potassium has been elevated in February and today.  Labs faxed to Dr. Loleta Chance to address.  Instructed patient that if Dr. Adaline Sill office does not call them, then they should call Dr. Loleta Chance.  Also spoke with Festus Barren at Dr. Adaline Sill office and informed of labs.  Festus Barren stated she would forward to Dr. Loleta Chance.

## 2011-10-18 ENCOUNTER — Telehealth: Payer: Self-pay | Admitting: Hematology and Oncology

## 2011-10-18 ENCOUNTER — Ambulatory Visit (HOSPITAL_BASED_OUTPATIENT_CLINIC_OR_DEPARTMENT_OTHER): Payer: Medicare Other | Admitting: Hematology and Oncology

## 2011-10-18 ENCOUNTER — Encounter: Payer: Self-pay | Admitting: Hematology and Oncology

## 2011-10-18 VITALS — BP 150/76 | HR 94 | Temp 96.9°F | Ht 69.0 in | Wt 182.3 lb

## 2011-10-18 DIAGNOSIS — C210 Malignant neoplasm of anus, unspecified: Secondary | ICD-10-CM

## 2011-10-18 DIAGNOSIS — C787 Secondary malignant neoplasm of liver and intrahepatic bile duct: Secondary | ICD-10-CM

## 2011-10-18 NOTE — Progress Notes (Signed)
IDENTIFYING STATEMENT:  The patient is a 66 year old man with a history of recurrent anal cancer with liver metastases who presents for followup.  INTERVAL HISTORY:  Mr. Ramesh was seen 4 months ago.  Continues have some orthopedic issues, otherwise has had no loss in weight.  He is moving his bowels.  He has not noted any rectal bleeding.  His appetite is fair.  He is without nausea and vomiting.  He received a staging CT scan of his abdomen and pelvis on 10/12/2011. The lung bases were clear.  The previously seen hyperdense lesion at the dome of the right hepatic lobe had partly further decreased in size, now measuring 2.1 x 1.1 cm.  There was no residual and no enhancement of this lesion.  There were no other new liver lesions.  There was no abdominal or pelvic adenopathy.  MEDICATIONS:  Reviewed and updated.  ALLERGIES:  Muscle relaxants.  REVIEW OF SYSTEMS:  As above.  Rest of review of systems negative.  PHYSICAL EXAM:  Patient is alert and oriented x3.  Vitals:  Pulse 94, blood pressure 150/76, temperature 96.9, respirations 20, weight 182.3 (183.1).  HEENT:  Head is atraumatic, normocephalic.  Sclerae anicteric. Mouth moist.  Chest:  Clear.  CVS:  First and second heart sounds present.  Port:  No signs infection.  Abdomen:  Soft, nontender.  Bowel sounds present.  Extremities:  Trace edema.  CNS:  Nonfocal.  Lymph nodes:  No adenopathy.  LAB DATA:  10/12/2011, white cell count 5.8, hemoglobin 11.3, hematocrit 34.7, platelets 295.  Sodium 147, potassium 5.2, chloride 102, CO2 29, creatinine 1, calcium 9, glucose 244, total bilirubin 0.5, alkaline phosphatase 90, AST 51, ALT 45.  Results of CTs as in interval history.  IMPRESSION AND PLAN:  Mr. Chittum is a 66 year old man with history of metastatic poorly differentiated carcinoma of the anus with solitary metastasis to the liver.  He is status post 3 cycles of 5-FU and cisplatin between Sep 18, 2010 and November 20, 2010  requiring dose reduction.  He also has had significant toxicity and was not keen to proceed with additional chemotherapy.  The patient went on to receive a percutaneous microwave ablation of the right lobe liver lesion on February 09, 2011.  His current CTs showed no evidence of progression.    He follows up in 6 months' time.    ______________________________ Laurice Record, M.D. LIO/MEDQ  D:  10/18/2011  T:  10/18/2011  Job:  [id number]

## 2011-10-18 NOTE — Patient Instructions (Addendum)
Donald [name]  [id number]  [facility] Discharge Instructions  RECOMMENDATIONS MADE BY THE CONSULTANT AND ANY TEST RESULTS WILL BE SENT TO YOUR REFERRING DOCTOR.   EXAM FINDINGS BY MD TODAY AND SIGNS AND SYMPTOMS TO REPORT TO CLINIC OR PRIMARY MD:   Your current list of medications are: Current Outpatient Prescriptions  Medication Sig Dispense Refill  . acidophilus (RISAQUAD) CAPS Take by mouth daily.        . bisacodyl (DULCOLAX) 5 MG EC tablet Take 5 mg by mouth daily as needed.        . dipyridamole-aspirin (AGGRENOX) 25-200 MG per 12 hr capsule Take 1 capsule by mouth 2 (two) times daily.       Marland Kitchen lidocaine-prilocaine (EMLA) cream Apply topically as needed.      Marland Kitchen lisinopril (PRINIVIL,ZESTRIL) 5 MG tablet Take 5 mg by mouth daily.        . metFORMIN (GLUMETZA) 500 MG (MOD) 24 hr tablet Take 500 mg by mouth daily with breakfast.        . omega-3 acid ethyl esters (LOVAZA) 1 G capsule Take 2 g by mouth 2 (two) times daily.        Marland Kitchen omeprazole (PRILOSEC) 20 MG capsule Take 20 mg by mouth daily.        . pregabalin (LYRICA) 50 MG capsule Take 50 mg by mouth 2 (two) times daily.         INSTRUCTIONS GIVEN AND DISCUSSED:   SPECIAL INSTRUCTIONS/FOLLOW-UP:  See above.  I acknowledge that I have been informed and understand all the instructions given to me and received a copy. I do not have any more questions at this time, but understand that I may call the [facility] at [phone] during business hours should I have any further questions or need assistance in obtaining follow-up care.

## 2011-10-18 NOTE — Telephone Encounter (Signed)
Gv pt appt for [id number].  scheduled pt for ct scan on 05/19/2012 @ [facility]

## 2011-10-18 NOTE — Progress Notes (Signed)
This office note has been dictated.

## 2011-10-24 ENCOUNTER — Ambulatory Visit
Admission: RE | Admit: 2011-10-24 | Discharge: 2011-10-24 | Disposition: A | Discharge status: Home / Self Care | Payer: Medicare Other | Source: Ambulatory Visit | Attending: Interventional Radiology | Admitting: Interventional Radiology

## 2011-10-24 DIAGNOSIS — C787 Secondary malignant neoplasm of liver and intrahepatic bile duct: Secondary | ICD-10-CM

## 2011-10-24 DIAGNOSIS — C210 Malignant neoplasm of anus, unspecified: Secondary | ICD-10-CM

## 2011-10-24 NOTE — Progress Notes (Signed)
PT DENIES ANY SYMPTOMS DOING WELL.

## 2011-10-30 ENCOUNTER — Ambulatory Visit (INDEPENDENT_AMBULATORY_CARE_PROVIDER_SITE_OTHER): Payer: Medicare Other | Admitting: Surgery

## 2011-10-30 ENCOUNTER — Encounter (INDEPENDENT_AMBULATORY_CARE_PROVIDER_SITE_OTHER): Payer: Self-pay | Admitting: Surgery

## 2011-10-30 VITALS — BP 182/84 | HR 100 | Temp 98.9°F | Resp 20 | Ht 69.0 in | Wt 182.2 lb

## 2011-10-30 DIAGNOSIS — C210 Malignant neoplasm of anus, unspecified: Secondary | ICD-10-CM

## 2011-10-30 NOTE — Progress Notes (Signed)
Subjective:     Patient ID: Donald [name], male   DOB: Nov 03, 1945, 66 y.o.   MRN: [id number]  HPIPatient returns for 6 month followup due to history of anal canal carcinoma. He underwent chemotherapy radiation therapy back in 2012 but developed a liver metastasis treated by radiofrequency ablation in July of 2012. He has completed chemotherapy. He continues to have some complaints of rectal and anal canal pain. He does have some occasional bleeding.   Review of Systems  Respiratory: Negative.   Cardiovascular: Negative.   Gastrointestinal: Positive for anal bleeding and rectal pain.  Psychiatric/Behavioral: Positive for dysphoric mood.       Objective:   Physical Exam  Constitutional: He appears well-developed and well-nourished.  Eyes: EOM are normal. Pupils are equal, round, and reactive to light.  Genitourinary:     Skin: Skin is warm and dry.       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

## 2011-10-30 NOTE — Patient Instructions (Signed)
Return 6 months

## 2011-11-21 ENCOUNTER — Ambulatory Visit (HOSPITAL_BASED_OUTPATIENT_CLINIC_OR_DEPARTMENT_OTHER): Payer: Medicare Other

## 2011-11-21 VITALS — BP 164/78 | HR 105 | Temp 98.0°F

## 2011-11-21 DIAGNOSIS — Z452 Encounter for adjustment and management of vascular access device: Secondary | ICD-10-CM

## 2011-11-21 DIAGNOSIS — C210 Malignant neoplasm of anus, unspecified: Secondary | ICD-10-CM

## 2011-11-21 MED ORDER — SODIUM CHLORIDE 0.9 % IJ SOLN
10.0000 mL | INTRAMUSCULAR | Status: DC | PRN
  Administered 2011-11-21: 10 mL via INTRAVENOUS
  Filled 2011-11-21: qty 10

## 2011-11-21 MED ORDER — HEPARIN SOD (PORK) LOCK FLUSH 100 UNIT/ML IV SOLN
500.0000 [IU] | Freq: Once | INTRAVENOUS | Status: AC | PRN
  Administered 2011-11-21: 500 [IU] via INTRAVENOUS
  Filled 2011-11-21: qty 5

## 2012-01-02 ENCOUNTER — Ambulatory Visit (HOSPITAL_BASED_OUTPATIENT_CLINIC_OR_DEPARTMENT_OTHER): Payer: Medicare Other

## 2012-01-02 VITALS — BP 184/73 | HR 105 | Temp 98.1°F | Resp 18

## 2012-01-02 DIAGNOSIS — C210 Malignant neoplasm of anus, unspecified: Secondary | ICD-10-CM

## 2012-01-02 DIAGNOSIS — Z452 Encounter for adjustment and management of vascular access device: Secondary | ICD-10-CM

## 2012-01-02 MED ORDER — HEPARIN SOD (PORK) LOCK FLUSH 100 UNIT/ML IV SOLN
500.0000 [IU] | Freq: Once | INTRAVENOUS | Status: AC
  Administered 2012-01-02: 500 [IU] via INTRAVENOUS
  Filled 2012-01-02: qty 5

## 2012-01-02 MED ORDER — SODIUM CHLORIDE 0.9 % IJ SOLN
10.0000 mL | INTRAMUSCULAR | Status: DC | PRN
  Administered 2012-01-02: 10 mL via INTRAVENOUS
  Filled 2012-01-02: qty 10

## 2012-01-23 ENCOUNTER — Inpatient Hospital Stay (HOSPITAL_COMMUNITY): Payer: Medicare Other

## 2012-01-23 ENCOUNTER — Inpatient Hospital Stay (HOSPITAL_COMMUNITY)
Admission: EM | Admit: 2012-01-23 | Discharge: 2012-01-31 | DRG: 253 | Disposition: A | Discharge status: Rehabilitation Facility | Payer: Medicare Other | Attending: Vascular Surgery | Admitting: Vascular Surgery

## 2012-01-23 ENCOUNTER — Encounter (HOSPITAL_COMMUNITY): Payer: Self-pay

## 2012-01-23 ENCOUNTER — Emergency Department (HOSPITAL_COMMUNITY): Payer: Medicare Other

## 2012-01-23 DIAGNOSIS — E119 Type 2 diabetes mellitus without complications: Secondary | ICD-10-CM

## 2012-01-23 DIAGNOSIS — E785 Hyperlipidemia, unspecified: Secondary | ICD-10-CM | POA: Diagnosis present

## 2012-01-23 DIAGNOSIS — Z85048 Personal history of other malignant neoplasm of rectum, rectosigmoid junction, and anus: Secondary | ICD-10-CM

## 2012-01-23 DIAGNOSIS — I739 Peripheral vascular disease, unspecified: Secondary | ICD-10-CM

## 2012-01-23 DIAGNOSIS — I2510 Atherosclerotic heart disease of native coronary artery without angina pectoris: Secondary | ICD-10-CM | POA: Diagnosis present

## 2012-01-23 DIAGNOSIS — K219 Gastro-esophageal reflux disease without esophagitis: Secondary | ICD-10-CM | POA: Diagnosis present

## 2012-01-23 DIAGNOSIS — D62 Acute posthemorrhagic anemia: Secondary | ICD-10-CM | POA: Diagnosis not present

## 2012-01-23 DIAGNOSIS — I70229 Atherosclerosis of native arteries of extremities with rest pain, unspecified extremity: Secondary | ICD-10-CM

## 2012-01-23 DIAGNOSIS — Z01818 Encounter for other preprocedural examination: Secondary | ICD-10-CM

## 2012-01-23 DIAGNOSIS — M79609 Pain in unspecified limb: Secondary | ICD-10-CM

## 2012-01-23 DIAGNOSIS — C787 Secondary malignant neoplasm of liver and intrahepatic bile duct: Secondary | ICD-10-CM | POA: Diagnosis present

## 2012-01-23 DIAGNOSIS — I10 Essential (primary) hypertension: Secondary | ICD-10-CM | POA: Diagnosis present

## 2012-01-23 DIAGNOSIS — F17200 Nicotine dependence, unspecified, uncomplicated: Secondary | ICD-10-CM | POA: Diagnosis present

## 2012-01-23 DIAGNOSIS — I69998 Other sequelae following unspecified cerebrovascular disease: Secondary | ICD-10-CM

## 2012-01-23 DIAGNOSIS — R29898 Other symptoms and signs involving the musculoskeletal system: Secondary | ICD-10-CM | POA: Diagnosis present

## 2012-01-23 HISTORY — DX: Personal history of other diseases of the musculoskeletal system and connective tissue: Z87.39

## 2012-01-23 HISTORY — DX: Other hemorrhoids: K64.8

## 2012-01-23 HISTORY — DX: Residual hemorrhoidal skin tags: K64.4

## 2012-01-23 HISTORY — DX: Pneumonia, unspecified organism: J18.9

## 2012-01-23 HISTORY — DX: Type 2 diabetes mellitus without complications: E11.9

## 2012-01-23 HISTORY — DX: Peripheral vascular disease, unspecified: I73.9

## 2012-01-23 LAB — TROPONIN I: Troponin I: <0.3 ng/mL (ref <0.30)

## 2012-01-23 LAB — COMPREHENSIVE METABOLIC PANEL
ALT: 37 U/L (ref 0–53)
Alkaline Phosphatase: 82 U/L (ref 39–117)
BUN: 12 mg/dL (ref 6–23)
CO2: 24 mEq/L (ref 19–32)
GFR calc Af Amer: >90 mL/min (ref >90)
GFR calc non Af Amer: 83 mL/min — ABNORMAL LOW (ref >90)
Glucose, Bld: 160 mg/dL — ABNORMAL HIGH (ref 70–99)
Potassium: 4 mEq/L (ref 3.5–5.1)
Total Bilirubin: 0.3 mg/dL (ref 0.3–1.2)
Total Protein: 7.4 g/dL (ref 6.0–8.3)

## 2012-01-23 LAB — CBC
Hemoglobin: 10.2 g/dL — ABNORMAL LOW (ref 13.0–17.0)
MCH: 25.9 pg — ABNORMAL LOW (ref 26.0–34.0)
MCV: 81.7 fL (ref 78.0–100.0)
RBC: 3.94 MIL/uL — ABNORMAL LOW (ref 4.22–5.81)
WBC: 6.1 10*3/uL (ref 4.0–10.5)

## 2012-01-23 LAB — DIFFERENTIAL
Eosinophils Absolute: 0.1 10*3/uL (ref 0.0–0.7)
Lymphocytes Relative: 28 % (ref 12–46)
Lymphs Abs: 1.7 10*3/uL (ref 0.7–4.0)
Monocytes Relative: 11 % (ref 3–12)
Neutrophils Relative %: 58 % (ref 43–77)

## 2012-01-23 LAB — POCT I-STAT TROPONIN I

## 2012-01-23 MED ORDER — LABETALOL HCL 5 MG/ML IV SOLN
10.0000 mg | INTRAVENOUS | Status: DC | PRN
  Filled 2012-01-23: qty 4

## 2012-01-23 MED ORDER — METOPROLOL TARTRATE 1 MG/ML IV SOLN: 2.0000 mg | INTRAVENOUS | Status: DC | PRN

## 2012-01-23 MED ORDER — HEPARIN (PORCINE) IN NACL 100-0.45 UNIT/ML-% IJ SOLN
16.0000 [IU]/kg/h | INTRAMUSCULAR | Status: DC
  Administered 2012-01-23: 14 [IU]/kg/h via INTRAVENOUS
  Administered 2012-01-24: 16 [IU]/kg/h via INTRAVENOUS
  Filled 2012-01-23 (×3): qty 250

## 2012-01-23 MED ORDER — HEPARIN BOLUS VIA INFUSION
3000.0000 [IU] | Freq: Once | INTRAVENOUS | Status: AC
  Administered 2012-01-23: 3000 [IU] via INTRAVENOUS
  Filled 2012-01-23: qty 3000

## 2012-01-23 MED ORDER — GUAIFENESIN-DM 100-10 MG/5ML PO SYRP: 15.0000 mL | ORAL_SOLUTION | ORAL | Status: DC | PRN

## 2012-01-23 MED ORDER — ALUM & MAG HYDROXIDE-SIMETH 200-200-20 MG/5ML PO SUSP: 15.0000 mL | ORAL | Status: DC | PRN

## 2012-01-23 MED ORDER — INFLUENZA VIRUS VACC SPLIT PF IM SUSP
0.5000 mL | INTRAMUSCULAR | Status: AC
  Administered 2012-01-24: 0.5 mL via INTRAMUSCULAR
  Filled 2012-01-23: qty 0.5

## 2012-01-23 MED ORDER — SODIUM CHLORIDE 0.9 % IV SOLN
INTRAVENOUS | Status: DC
  Administered 2012-01-23: 17:00:00 via INTRAVENOUS
  Administered 2012-01-25: 1000 mL via INTRAVENOUS
  Administered 2012-01-27: 12:00:00 via INTRAVENOUS

## 2012-01-23 MED ORDER — OXYCODONE-ACETAMINOPHEN 5-325 MG PO TABS
1.0000 | ORAL_TABLET | ORAL | Status: DC | PRN
  Administered 2012-01-24 – 2012-01-28 (×13): 2 via ORAL
  Filled 2012-01-23 (×13): qty 2

## 2012-01-23 MED ORDER — HYDRALAZINE HCL 20 MG/ML IJ SOLN
10.0000 mg | INTRAMUSCULAR | Status: DC | PRN
  Filled 2012-01-23: qty 0.5

## 2012-01-23 MED ORDER — ONDANSETRON HCL 4 MG/2ML IJ SOLN: 4.0000 mg | Freq: Four times a day (QID) | INTRAMUSCULAR | Status: DC | PRN

## 2012-01-23 MED ORDER — IOHEXOL 350 MG/ML SOLN
125.0000 mL | Freq: Once | INTRAVENOUS | Status: AC | PRN
  Administered 2012-01-23: 125 mL via INTRAVENOUS

## 2012-01-23 MED ORDER — PHENOL 1.4 % MT LIQD
1.0000 | OROMUCOSAL | Status: DC | PRN
  Filled 2012-01-23: qty 177

## 2012-01-23 MED ORDER — PANTOPRAZOLE SODIUM 40 MG PO TBEC
40.0000 mg | DELAYED_RELEASE_TABLET | Freq: Every day | ORAL | Status: DC
  Administered 2012-01-24 – 2012-01-31 (×7): 40 mg via ORAL
  Filled 2012-01-23 (×6): qty 1

## 2012-01-23 MED ORDER — KCL IN DEXTROSE-NACL 20-5-0.45 MEQ/L-%-% IV SOLN: Freq: Once | INTRAVENOUS | Status: DC

## 2012-01-23 MED ORDER — DOCUSATE SODIUM 100 MG PO CAPS
100.0000 mg | ORAL_CAPSULE | Freq: Two times a day (BID) | ORAL | Status: DC
  Administered 2012-01-24 – 2012-01-31 (×12): 100 mg via ORAL
  Filled 2012-01-23 (×16): qty 1

## 2012-01-23 MED ORDER — MORPHINE SULFATE 2 MG/ML IJ SOLN
2.0000 mg | INTRAMUSCULAR | Status: DC | PRN
  Administered 2012-01-24 (×2): 4 mg via INTRAVENOUS
  Administered 2012-01-24: 2 mg via INTRAVENOUS
  Administered 2012-01-24: 4 mg via INTRAVENOUS
  Administered 2012-01-25: 5 mg via INTRAVENOUS
  Administered 2012-01-25: 2 mg via INTRAVENOUS
  Administered 2012-01-25: 5 mg via INTRAVENOUS
  Administered 2012-01-25: 2 mg via INTRAVENOUS
  Administered 2012-01-25: 4 mg via INTRAVENOUS
  Administered 2012-01-26 (×3): 5 mg via INTRAVENOUS
  Administered 2012-01-26: 2 mg via INTRAVENOUS
  Administered 2012-01-26: 4 mg via INTRAVENOUS
  Administered 2012-01-26: 2 mg via INTRAVENOUS
  Administered 2012-01-26: 4 mg via INTRAVENOUS
  Administered 2012-01-27 (×3): 5 mg via INTRAVENOUS
  Administered 2012-01-27 – 2012-01-29 (×6): 4 mg via INTRAVENOUS
  Filled 2012-01-23: qty 2
  Filled 2012-01-23: qty 1
  Filled 2012-01-23: qty 2
  Filled 2012-01-23: qty 1
  Filled 2012-01-23 (×4): qty 3
  Filled 2012-01-23 (×5): qty 2
  Filled 2012-01-23: qty 1
  Filled 2012-01-23: qty 3
  Filled 2012-01-23: qty 2
  Filled 2012-01-23: qty 3
  Filled 2012-01-23: qty 2
  Filled 2012-01-23: qty 1
  Filled 2012-01-23: qty 2
  Filled 2012-01-23: qty 3
  Filled 2012-01-23: qty 2
  Filled 2012-01-23: qty 3

## 2012-01-23 MED ORDER — BISACODYL 5 MG PO TBEC
5.0000 mg | DELAYED_RELEASE_TABLET | Freq: Every day | ORAL | Status: DC | PRN
  Administered 2012-01-28 – 2012-01-29 (×2): 5 mg via ORAL
  Filled 2012-01-23 (×2): qty 1

## 2012-01-23 NOTE — ED Provider Notes (Signed)
History     CSN: [id number]  Arrival date & time 01/23/12  4098 First MD Initiated Contact with Patient 01/23/12 [phone]      Chief Complaint  Patient presents with  . Dizziness  . Weakness  . Foot Pain    rt foot pulse weak and foot is cold    HPI Pt has been having pain in his right leg for the last week and a half.  Pt called his doctor and was told to increase his medication.  It helped somewhat but it has been increasing.  Pt has history of neuropathy as well as problems with peripheral circulation.   He denies fever.  He has not noted any redness or drainage.  He has seen [facility] in the past.  Pt is not able to walk much anywhere.  He feels like the right leg is burning.  Pain increases with movement. He denies any new focal weakness.  NO trouble with speech.  No trouble with coordination.  He does feel dizzy and feels like he is more unsteady than usual over the last week.  Pt continues to smoke.    Past Medical History  Diagnosis Date  . GERD (gastroesophageal reflux disease)   . Diabetes mellitus   . Hyperlipidemia   . Hypertension   . Stroke   . anal ca dx'd 08/2007  . Metastases to the liver dx'd 08/2010    Past Surgical History  Procedure Date  . Colon surgery     No family history on file.  History  Substance Use Topics  . Smoking status: Current Every Day Smoker -- 2.0 packs/day for 50 years    Types: Cigarettes  . Smokeless tobacco: Never Used  . Alcohol Use: No      Review of Systems  Constitutional: Negative for fever.  All other systems reviewed and are negative.    Allergies  Other  Home Medications   Current Outpatient Rx  Name Route Sig Dispense Refill  . RISAQUAD PO CAPS Oral Take by mouth daily.      Marland Kitchen BISACODYL 5 MG PO TBEC Oral Take 20 mg by mouth daily.     . ASPIRIN-DIPYRIDAMOLE ER 25-200 MG PO CP12 Oral Take 1 capsule by mouth 2 (two) times daily.     Marland Kitchen GABAPENTIN 100 MG PO CAPS Oral Take 200 mg by mouth at  bedtime.    Marland Kitchen LISINOPRIL 5 MG PO TABS Oral Take 5 mg by mouth daily.      Marland Kitchen METFORMIN HCL ER (MOD) 500 MG PO TB24 Oral Take 500 mg by mouth 2 (two) times daily with a meal.     . OMEGA-3-ACID ETHYL ESTERS 1 G PO CAPS Oral Take 2 g by mouth 2 (two) times daily.      Marland Kitchen OMEPRAZOLE 20 MG PO CPDR Oral Take 40 mg by mouth daily.       SpO2 97%  Physical Exam  Nursing note and vitals reviewed. Constitutional: He appears well-developed and well-nourished. No distress.  HENT:  Head: Normocephalic and atraumatic.  Right Ear: External ear normal.  Left Ear: External ear normal.  Eyes: Conjunctivae normal are normal. Right eye exhibits no discharge. Left eye exhibits no discharge. No scleral icterus.  Neck: Neck supple. No tracheal deviation present.  Cardiovascular: Normal rate, regular rhythm and intact distal pulses.   Pulmonary/Chest: Effort normal and breath sounds normal. No stridor. No respiratory distress. He has no wheezes. He has no rales.  Abdominal: Soft. Bowel  sounds are normal. He exhibits no distension. There is no tenderness. There is no rebound and no guarding.  Musculoskeletal: He exhibits no edema and no tenderness.       Unable to palpate pulse right foot, lower extremity and the right side paler and cooler to touch than left, no cyanosis  Neurological: He is alert. No cranial nerve deficit ( no gross defecits noted) or sensory deficit. He exhibits abnormal muscle tone. He displays no seizure activity. Coordination abnormal.       Weakness left upper extremity, limited movement of left arm and left hand;   Skin: Skin is warm and dry. No rash noted.  Psychiatric: He has a normal mood and affect.    ED Course  Procedures (including critical care time) EKG RAte 86 SINUS RHYTHM ~ normal P axis, V-rate 50- 99 BORDERLINE AV CONDUCTION DELAY ~ PR >210, V-rate 50- 90 IVCD, CONSIDER ATYPICAL RBBB ~ QRSd>163mS, terminal axis(90,270) CONSIDER ANTERIOR INFARCT ~ diminished R <0.70mV  V3 No sig changes Labs Reviewed  GLUCOSE, CAPILLARY - Abnormal; Notable for the following:    Glucose-Capillary 156 (*)     All other components within normal limits  CBC - Abnormal; Notable for the following:    RBC 3.94 (*)     Hemoglobin 10.2 (*)     HCT 32.2 (*)     MCH 25.9 (*)     All other components within normal limits  COMPREHENSIVE METABOLIC PANEL - Abnormal; Notable for the following:    Glucose, Bld 160 (*)     Albumin 3.4 (*)     AST 43 (*)     GFR calc non Af Amer 83 (*)     All other components within normal limits  PROTIME-INR  APTT  DIFFERENTIAL  TROPONIN I  POCT I-STAT TROPONIN I   Ct Head Wo Contrast  01/23/2012  *RADIOLOGY REPORT*  Clinical Data: Dizziness, weakness.  CT HEAD WITHOUT CONTRAST  Technique:  Contiguous axial images were obtained from the base of the skull through the vertex without contrast.  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 No acute intracranial abnormality.  Specifically, no hemorrhage, hydrocephalus, mass lesion, acute infarction, or significant intracranial injury.  No acute calvarial abnormality. Visualized paranasal sinuses and mastoids clear.  Orbital soft tissues unremarkable.  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 No acute intracranial abnormality.   Original Report Authenticated By: Cyndie Chime, M.D.    Ct Angio Low Extrem Right W/cm &/or Wo/cm  01/23/2012  *RADIOLOGY REPORT*  Clinical Data: Right foot pain.  Cold right foot.  History of metastatic anal cancer.  CTA AORTA, BILATERAL LOWER EXTREMITY RUNOFF  Comparison: CT abdomen pelvis 10/12/2011  Contrast:  125 ml Omnipaque 300 IV.  Technique:  Multidetector helical CT imaging was performed through the abdomen, pelvis, and lower extremities following IV contrast administration.  2-D reconstructed images and 3-D reformatted images were also performed and reviewed.  Findings: Marked atherosclerotic irregularity with  irregular plaque throughout the infrarenal aorta.  There is complete occlusion of the right external iliac vein at its origin.  This is new since 10/12/2011.  A short segment of reconstituted common femoral artery on the right.  The right superficial femoral artery is occluded. This is stable when compared to 10/12/2011.  Left external iliac artery is markedly irregular.  Chronic occlusion of the left superficial femoral artery.  There is reconstitution of the popliteal arteries bilaterally which are severely diseased. Calcified plaque within the  right tibial peroneal trunk.  The trifurcation vessels in the right calf appear patent with the dominant vessel being the posterior tibial artery.  On the left, the trifurcation vessels are patent.  Again, the dominant vessel is the left posterior tibial artery.  The previously seen liver lesion not as well visualized on this arterial phase study.  There are gallstones within the gallbladder. Spleen, pancreas, adrenals are unremarkable.  Appendix is visualized and is normal.  Urinary bladder and prostate grossly unremarkable.  No free fluid, free air or adenopathy.  No acute bony abnormality.  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  Reconstitution of a short segment of common femoral artery with opacification of the right profunda femoris.  Bilateral superficial femoral artery is chronically occluded.  There is reconstitution of the popliteal arteries bilaterally which are moderately to severely diseased.  The three-vessel runoff in the calves bilaterally with the dominant vessels being the posterior tibial arteries.  There is calcified plaque within the tibioperoneal trunk on the right.  Marked atherosclerotic irregularity in the infrarenal aorta.   Original Report Authenticated By: Cyndie Chime, M.D.      1. Peripheral vascular disease with claudication       MDM  I reviewed the patient's old records. He saw a  vascular surgery in 2011 and had an angiogram. The findings showed he had significant peripheral vascular disease but the patient did not have any followup vascular procedure. Patient has noted these worsening symptoms in the last week. The CT scan does show significant peripheral vascular disease. We have been unable to Doppler or palpate any pulses in the right foot. I suspect he has some trouble with chronic occlusion that is progressively getting worse. Dr. Darrick Penna is in the OR at this time. I have relayed messages to him. He will evaluate the patient ED when he completed his surgery.  Regarding the dizziness, there is no evidence to suggest acute CVA at this time although he has multiple risk factors.    Celene Kras, MD 01/23/12 1500

## 2012-01-23 NOTE — Progress Notes (Signed)
ANTICOAGULATION CONSULT NOTE - Initial Consult  Pharmacy Consult for heparin Indication: PVD  Allergies  Allergen Reactions  . Other     Pt states allergic to "muscle relaxer" but unsure of name.    Patient Measurements: Height: 5\' 9"  (175.3 cm) Weight: 176 lb 2.4 oz (79.9 kg) IBW/kg (Calculated) : 70.7  Heparin Dosing Weight: 80kg  Vital Signs: Temp: 98.4 F (36.9 C) (09/25 2027) Temp src: Oral (09/25 2027) BP: 150/62 mmHg (09/25 2027) Pulse Rate: 76  (09/25 1800)  Labs:  Basename 01/23/12 1051 01/23/12 1009  HGB -- 10.2*  HCT -- 32.2*  PLT -- 334  APTT -- 27  LABPROT -- 12.9  INR -- 0.98  HEPARINUNFRC -- --  CREATININE -- 0.98  CKTOTAL -- --  CKMB -- --  TROPONINI <0.30 --    Estimated Creatinine Clearance: 72.1 ml/min (by C-G formula based on Cr of 0.98).   Medical History: Past Medical History  Diagnosis Date  . GERD (gastroesophageal reflux disease)   . Diabetes mellitus   . Hyperlipidemia   . Hypertension   . Stroke   . anal ca dx'd 08/2007  . Metastases to the liver dx'd 08/2010    Medications:  Prescriptions prior to admission  Medication Sig Dispense Refill  . acidophilus (RISAQUAD) CAPS Take by mouth daily.        . bisacodyl (DULCOLAX) 5 MG EC tablet Take 20 mg by mouth daily.       Marland Kitchen dipyridamole-aspirin (AGGRENOX) 25-200 MG per 12 hr capsule Take 1 capsule by mouth 2 (two) times daily.       Marland Kitchen gabapentin (NEURONTIN) 100 MG capsule Take 200 mg by mouth at bedtime.      Marland Kitchen lisinopril (PRINIVIL,ZESTRIL) 5 MG tablet Take 5 mg by mouth daily.        . metFORMIN (GLUMETZA) 500 MG (MOD) 24 hr tablet Take 500 mg by mouth 2 (two) times daily with a meal.       . omega-3 acid ethyl esters (LOVAZA) 1 G capsule Take 2 g by mouth 2 (two) times daily.        Marland Kitchen omeprazole (PRILOSEC) 20 MG capsule Take 40 mg by mouth daily.         Assessment: 66 year old man to be started on IV heparin for PVD,.  Baseline coags are in the normal range.  Creatinine is  normal at 0.98. Goal of Therapy:  Heparin level 0.3-0.7 units/ml Monitor platelets by anticoagulation protocol: Yes   Plan:  Give 3000 units bolus x 1, then start heparin infusion at 1100 units/hour.  Hepain level in 6 hours and daily while on heparin.  Daily CBC while on heparin.  Mickeal Skinner 01/23/2012,8:36 PM

## 2012-01-23 NOTE — ED Notes (Signed)
Pt presents with no acute distress.  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  Denies injury.  Orthostatic performed in route- stable

## 2012-01-23 NOTE — Consult Note (Signed)
[facility] HISTORY AND PHYSICAL   History of Present Illness:  Patient is a 66 y.o. year old male who presents for evaluation of pain in the right foot.  This has been getting worse over the last 10 days.  He says he feels like there is a tight band on the foot.  It has been fairly slow onset.  Pt previously followed by Dr Edilia Bo for aortoiliac, sfa, popliteal atherosclerosis.  Pt was last seen in 2012 and at that time had tolerable claudication symptoms.  No history of ulcers on the feet.  Currently smoking 2 PPD. 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  These problems are all stable.  He denies chest pain or shortness of breath although he is not very active.  He denies prior history of MI and states he has never seen a cardiologist.  Past Medical History  Diagnosis Date  . GERD (gastroesophageal reflux disease)   . Diabetes mellitus   . Hyperlipidemia   . Hypertension   . Stroke   . anal ca dx'd 08/2007  . Metastases to the liver dx'd 08/2010    Past Surgical History  Procedure Date  . Colon surgery      Social History History  Substance Use Topics  . Smoking status: Current Every Day Smoker -- 2.0 packs/day for 50 years    Types: Cigarettes  . Smokeless tobacco: Never Used  . Alcohol Use: No    Family History No family history on file.  Allergies  Allergies  Allergen Reactions  . Other     Pt states allergic to "muscle relaxer" but unsure of name.     Current Facility-Administered Medications  Medication Dose Route Frequency Provider Last Rate Last Dose  . dextrose 5 % and 0.45 % NaCl with KCl 20 mEq/L infusion   Intravenous Once Celene Kras, MD      . iohexol (OMNIPAQUE) 350 MG/ML injection 125 mL  125 mL Intravenous Once PRN Medication Radiologist, MD   125 mL at 01/23/12 1327   Current Outpatient  Prescriptions  Medication Sig Dispense Refill  . acidophilus (RISAQUAD) CAPS Take by mouth daily.        . bisacodyl (DULCOLAX) 5 MG EC tablet Take 20 mg by mouth daily.       Marland Kitchen dipyridamole-aspirin (AGGRENOX) 25-200 MG per 12 hr capsule Take 1 capsule by mouth 2 (two) times daily.       Marland Kitchen gabapentin (NEURONTIN) 100 MG capsule Take 200 mg by mouth at bedtime.      Marland Kitchen lisinopril (PRINIVIL,ZESTRIL) 5 MG tablet Take 5 mg by mouth daily.        . metFORMIN (GLUMETZA) 500 MG (MOD) 24 hr tablet Take 500 mg by mouth 2 (two) times daily with a meal.       . omega-3 acid ethyl esters (LOVAZA) 1 G capsule Take 2 g by mouth 2 (two) times daily.        Marland Kitchen omeprazole (PRILOSEC) 20 MG capsule Take 40 mg by mouth daily.         ROS:   General:  No weight loss, Fever, chills  HEENT: No recent headaches, no nasal bleeding, no visual changes, no sore throat  Neurologic: No dizziness, blackouts, seizures. No recent symptoms of stroke or mini- stroke. No recent episodes of slurred speech, or temporary blindness.  Cardiac: No recent episodes  of chest pain/pressure, no shortness of breath at rest.  + shortness of breath with exertion.  Denies history of atrial fibrillation or irregular heartbeat  Vascular: No history of non-healing ulcer, No history of DVT   Pulmonary: No home oxygen, no productive cough, no hemoptysis,  No asthma or wheezing  Musculoskeletal:  [ ]  Arthritis, [ ]  Low back pain,  [ ]  Joint pain  Hematologic:No history of hypercoagulable state.  No history of easy bleeding.  No history of anemia  Gastrointestinal: No hematochezia or melena,  + gastroesophageal reflux, no trouble swallowing, occasional constipation related to prior rectal procedures  Urinary: [ ]  chronic Kidney disease, [ ]  on HD - [ ]  MWF or [ ]  TTHS, [ ]  Burning with urination, [ ]  Frequent urination, [ ]  Difficulty urinating;   Skin: No rashes  Psychological: No history of anxiety,  No history of depression   Physical  Examination  Filed Vitals:   01/23/12 1126 01/23/12 1315 01/23/12 1319 01/23/12 1608  BP: 147/95 161/64  140/57  Pulse:    71  Temp:  97.9 F (36.6 C) 97.9 F (36.6 C) 97.5 F (36.4 C)  Resp: 17 18    SpO2: 97% 94%  100%    There is no height or weight on file to calculate BMI.  General:  Alert and oriented, no acute distress HEENT: Normal Neck: No bruit or JVD Pulmonary: Clear to auscultation bilaterally Cardiac: Regular Rate and Rhythm without murmur Abdomen: Soft, non-tender, non-distended, no mass Skin: No rash, no ulcer Extremity Pulses:  2+ radial, brachial,1+ left femoral, absent right femoral absent dorsalis pedis, posterior tibial pulses bilaterally, faint right PT doppler, strong monophasic to biphasic left PT doppler Musculoskeletal: No deformity or edema  Neurologic: Upper and lower extremity motor 5/5 right side, contracture left hand with some weakness left side  DATA:  CTA reviewed bilat SFA occlusions with 1 vessel PT runoff all chronic since at least 2011, severe diffuse aorto iliac occlusive disease also chronic since 2011, new occlusion right external iliac artery   ASSESSMENT: Worsening occlusive disease right leg now with threatened limb and rest pain.  Will probably need ax bifem probably not very good candidate for aortobifem +/_ lower extremity outflow procedure   PLAN:  Admit to [facility].  Will get cardiology eval and will probably need agram in the next few days for planning purposes vs percutaneous intervention.  Will inform Dr Edilia Bo of pts admission.  Heparin drip for now  Fabienne Bruns, MD [facility] Office: [phone] Pager: [phone]

## 2012-01-23 NOTE — ED Notes (Signed)
Bed:WA06<BR> Expected date:<BR> Expected time:<BR> Means of arrival:<BR> Comments:<BR> ems

## 2012-01-23 NOTE — ED Notes (Signed)
MD at bedside.  EDP Knapp present to evaluate this pt 

## 2012-01-23 NOTE — ED Notes (Signed)
Pt presents with no acute distress.  No changes in vascular condition of rt foot

## 2012-01-23 NOTE — ED Notes (Signed)
Accessed Right chest PAC using sterile technique good blood return. Obsite covered initialed and dated.

## 2012-01-24 ENCOUNTER — Inpatient Hospital Stay (HOSPITAL_COMMUNITY): Payer: Medicare Other

## 2012-01-24 ENCOUNTER — Encounter (HOSPITAL_COMMUNITY): Payer: Self-pay | Admitting: Physician Assistant

## 2012-01-24 DIAGNOSIS — E119 Type 2 diabetes mellitus without complications: Secondary | ICD-10-CM

## 2012-01-24 DIAGNOSIS — Z01818 Encounter for other preprocedural examination: Secondary | ICD-10-CM

## 2012-01-24 DIAGNOSIS — I739 Peripheral vascular disease, unspecified: Secondary | ICD-10-CM

## 2012-01-24 DIAGNOSIS — M79609 Pain in unspecified limb: Secondary | ICD-10-CM

## 2012-01-24 DIAGNOSIS — I70229 Atherosclerosis of native arteries of extremities with rest pain, unspecified extremity: Principal | ICD-10-CM

## 2012-01-24 LAB — GLUCOSE, CAPILLARY
Glucose-Capillary: 138 mg/dL — ABNORMAL HIGH (ref 70–99)
Glucose-Capillary: 154 mg/dL — ABNORMAL HIGH (ref 70–99)
Glucose-Capillary: 131 mg/dL — ABNORMAL HIGH (ref 70–99)

## 2012-01-24 LAB — URINALYSIS, ROUTINE W REFLEX MICROSCOPIC
Ketones, ur: 15 mg/dL — AB
Leukocytes, UA: NEGATIVE
Nitrite: NEGATIVE
Protein, ur: NEGATIVE mg/dL
pH: 5.5 (ref 5.0–8.0)

## 2012-01-24 LAB — CBC
MCH: 25.3 pg — ABNORMAL LOW (ref 26.0–34.0)
MCHC: 30.8 g/dL (ref 30.0–36.0)
MCV: 82.3 fL (ref 78.0–100.0)
Platelets: 300 10*3/uL (ref 150–400)
RDW: 15.4 % (ref 11.5–15.5)

## 2012-01-24 LAB — PROTIME-INR: INR: 1.05 (ref 0.00–1.49)

## 2012-01-24 LAB — COMPREHENSIVE METABOLIC PANEL
AST: 39 U/L — ABNORMAL HIGH (ref 0–37)
Albumin: 3.2 g/dL — ABNORMAL LOW (ref 3.5–5.2)
CO2: 23 mEq/L (ref 19–32)
Calcium: 9 mg/dL (ref 8.4–10.5)
Creatinine, Ser: 1.04 mg/dL (ref 0.50–1.35)
GFR calc non Af Amer: 72 mL/min — ABNORMAL LOW (ref >90)
Total Protein: 6.5 g/dL (ref 6.0–8.3)

## 2012-01-24 MED ORDER — SODIUM CHLORIDE 0.9 % IV SOLN: 250.0000 mL | INTRAVENOUS | Status: DC | PRN

## 2012-01-24 MED ORDER — TECHNETIUM TC 99M SESTAMIBI GENERIC - CARDIOLITE
30.0000 | Freq: Once | INTRAVENOUS | Status: AC | PRN
  Administered 2012-01-24: 30 via INTRAVENOUS

## 2012-01-24 MED ORDER — HEPARIN BOLUS VIA INFUSION
2000.0000 [IU] | Freq: Once | INTRAVENOUS | Status: DC
  Filled 2012-01-24: qty 2000

## 2012-01-24 MED ORDER — TECHNETIUM TC 99M SESTAMIBI GENERIC - CARDIOLITE
10.0000 | Freq: Once | INTRAVENOUS | Status: AC | PRN
  Administered 2012-01-24: 10 via INTRAVENOUS

## 2012-01-24 MED ORDER — SODIUM CHLORIDE 0.9 % IV SOLN: 1.0000 mL/kg/h | INTRAVENOUS | Status: DC

## 2012-01-24 MED ORDER — SODIUM CHLORIDE 0.9 % IJ SOLN: 3.0000 mL | Freq: Two times a day (BID) | INTRAMUSCULAR | Status: DC

## 2012-01-24 MED ORDER — REGADENOSON 0.4 MG/5ML IV SOLN
0.4000 mg | Freq: Once | INTRAVENOUS | Status: AC
  Administered 2012-01-24: 0.4 mg via INTRAVENOUS
  Filled 2012-01-24: qty 5

## 2012-01-24 MED ORDER — OXYCODONE-ACETAMINOPHEN 5-325 MG PO TABS
ORAL_TABLET | ORAL | Status: AC
  Filled 2012-01-24: qty 2

## 2012-01-24 MED ORDER — SODIUM CHLORIDE 0.9 % IJ SOLN: 3.0000 mL | INTRAMUSCULAR | Status: DC | PRN

## 2012-01-24 MED ORDER — ASPIRIN 81 MG PO CHEW
324.0000 mg | CHEWABLE_TABLET | ORAL | Status: AC
  Administered 2012-01-25: 324 mg via ORAL
  Filled 2012-01-24: qty 4

## 2012-01-24 NOTE — Progress Notes (Signed)
Lexiscan Myoview performed. 

## 2012-01-24 NOTE — Progress Notes (Signed)
Patient received via carelink from [facility]. He appears alert and oriented and stable. Upon assessment pulse on the right leg was absent, and that of the left leg present with doppler. Patient also came in with a porta cath already accessed with normal saline running at 50. Patient later complain of bee like pain on his right foot. MD Dr. Darrick Penna was notify and pain management initiated. No new order from MD. Continue to monitor. Vital signs where stable  01/23/12 2027  Vitals  Temp 98.4 F (36.9 C)  Temp src Oral  BP ! 150/62 mmHg  BP Location Right arm  BP Method Automatic  Patient Position, if appropriate Lying  Resp 18

## 2012-01-24 NOTE — Consult Note (Signed)
CARDIOLOGY CONSULT NOTE   Patient ID: Donald [name] MRN: [id number] DOB/AGE: [birth year] 68 y.o.  Admit date: 01/23/2012  Primary Physician   Evlyn Courier, MD Primary Cardiologist   New Reason for Consultation   Pre-op eval  NFA:[id number] R Bender is a 66 y.o. male with a history of PVD but no CAD. He was admitted for right foot pain on 9/25. His LE CTA showed significant PV disease and he needs LE bypass, possibly an axillary-bifem. Cardiology was asked to see him pre-op to assess cardiac risk.   Donald [name] never gets chest pain. He does not move around a lot at home but does not have exertional symptoms. He smokes 2 ppd and coughs daily but has not had fevers or purulent sputum. Since his CVA in 2008, his left side is weak but he is very careful and does not fall. He has chronic DOE but denies orthopnea or PND. He has slight LE edema, but this is chronic and has not changed recently.    Past Medical History  Diagnosis Date  . GERD (gastroesophageal reflux disease)   . Hyperlipidemia   . Hypertension   . Peripheral vascular disease   . Type II diabetes mellitus   . Pneumonia   . Internal and external bleeding hemorrhoids   . Stroke 2008    "left side weak; unable to move left hand still" (01/23/2012)  . History of gout     left great toe  . anal ca dx'd 08/2007    S/P chemo, radiation, biopsies  . Metastases to the liver dx'd 08/2010     Past Surgical History  Procedure Date  . Carotid endarterectomy 2008    right  . Liver cryoablation 08/2010    "chemo shrunk the cancer 50% then they went in and burned the rest of it"  . Mass biopsy     rectal; "found cancer"    Allergies  Allergen Reactions  . Other     Pt states allergic to "muscle relaxer" but unsure of name.    I have reviewed the patient's current medications   . docusate sodium  100 mg Oral BID  . heparin  3,000 Units Intravenous Once  . influenza  inactive virus vaccine  0.5 mL Intramuscular  Tomorrow-1000  . pantoprazole  40 mg Oral Q1200  . DISCONTD: dextrose 5 % and 0.45 % NaCl with KCl 20 mEq/L   Intravenous Once      . sodium chloride 50 mL/hr at 01/23/12 1717  . heparin 14 Units/kg/hr (01/23/12 2257)   alum & mag hydroxide-simeth, bisacodyl, guaiFENesin-dextromethorphan, hydrALAZINE, iohexol, labetalol, metoprolol, morphine injection, ondansetron, oxyCODONE-acetaminophen, phenol  Medication Sig  acidophilus (RISAQUAD) CAPS Take by mouth daily.    bisacodyl (DULCOLAX) 5 MG EC tablet Take 20 mg by mouth daily.   dipyridamole-aspirin (AGGRENOX) 25-200 MG per 12 hr capsule Take 1 capsule by mouth 2 (two) times daily.   gabapentin (NEURONTIN) 100 MG capsule Take 200 mg by mouth at bedtime.  lisinopril (PRINIVIL,ZESTRIL) 5 MG tablet Take 5 mg by mouth daily.    metFORMIN 500 MG (MOD) 24 hr tablet Take 500 mg by mouth 2 (two) times daily with a meal.   omega-3 acid ethyl esters (LOVAZA) 1 G capsule Take 2 g by mouth 2 (two) times daily.    omeprazole (PRILOSEC) 20 MG capsule Take 40 mg by mouth daily.    History   Social History  . Marital Status: Married    Spouse Name: N/A  Number of Children: N/A  . Years of Education: N/A   Occupational History  . Retired - [employer]     2004   Social History Main Topics  . Smoking status: Current Every Day Smoker -- 2.0 packs/day for 50 years    Types: Cigarettes  . Smokeless tobacco: Never Used  . Alcohol Use: No  . Drug Use: Yes    Special: Marijuana     01/23/2012 "used marijuana in the 1960's"  . Sexually Active: Not Currently   Other Topics Concern  . Not on file   Social History Narrative   2 siblings, no known CAD. No children.   Family Status  Relation Status Death Age  . Mother Deceased 31    DM, no CAD  . Father Deceased     No info available   ROS: No headaches, no change in chronic left weakness. No palpitations, presyncope or syncope. Occasional GERD. Hx constipation. Full 14 point review of  systems complete and found to be negative unless listed above.  Physical Exam: Blood pressure 165/59, pulse 76, temperature 98 F (36.7 C), temperature source Oral, resp. rate 18, height 5\' 9"  (1.753 m), weight 176 lb 2.4 oz (79.9 kg), SpO2 96.00%.  General: Well developed, well nourished, male in no acute distress Head: Eyes PERRLA, No xanthomas.   Normocephalic and atraumatic, oropharynx without edema or exudate. Dentition: poor Lungs: bilaterally decreased breath sounds with no wheeze or crackles.  Heart: HRRR S1 S2, no rub/gallop, soft systolic murmur. pulses are 2+ upper extrem.  No RLE pulses palpable, no LLE pulses palpated (were Dopplered) Neck: Bilateral carotid bruits, possible radiation of murmur on the right. No lymphadenopathy.  JVD not elevated. Abdomen: Bowel sounds present, abdomen soft and non-tender without masses or hernias noted. Msk:  No spine or cva tenderness. No weakness, no joint deformities or effusions. Extremities: No clubbing or cyanosis. Trace pedal edema.  Neuro: Alert and oriented X 3. Chronic slurred speech and slower speech, no recent change, per pt. Psych:  Good affect, responds appropriately Skin: No rashes or lesions noted. Mottled skin bilateral LE, right>left  Labs:   Lab Results  Component Value Date   WBC 7.3 01/24/2012   HGB 9.3* 01/24/2012   HCT 30.2* 01/24/2012   MCV 82.3 01/24/2012   PLT 300 01/24/2012    Basename 01/24/12 0502  INR 1.05    Lab 01/24/12 0502  NA 139  K 3.9  CL 104  CO2 23  BUN 9  CREATININE 1.04  CALCIUM 9.0  PROT 6.5  BILITOT 0.3  ALKPHOS 70  ALT 30  AST 39*  GLUCOSE 136*   Magnesium  Date Value Range Status  12/14/2010 1.7  1.5 - 2.5 mg/dL Final    Basename 16/10/96 1051  CKTOTAL --  CKMB --  TROPONINI <0.30    Basename 01/23/12 1058  TROPIPOC 0.01   Echo: 09/12/2006 SUMMARY - Overall left ventricular systolic function was normal. Left ventricular ejection fraction was estimated , range being 60 %  to 65 %. There were no left ventricular regional wall motion abnormalities. There was marked asymmetric septal hypertrophy. There was Doppler evidence for a dynamic left ventricular outflow tract obstruction with a peak gradient of 7 mmHg. IMPRESSIONS - There was no echocardiographic evidence for a cardiac source of embolism.  ECG: 23-Jan-2012 21:35:39   Normal sinus rhythm Left axis deviation Right bundle branch block No significant change since last tracing Vent. rate 79 BPM PR interval 198 ms QRS duration 138 ms  QT/QTc 398/456 ms P-R-T axes 53 -31 13  Radiology:  Dg Chest 2 View 01/23/2012  *RADIOLOGY REPORT*  Clinical Data: 66 year old male preoperative study.  Peripheral vascular disease.  CHEST - 2 VIEW  Comparison: 06/19/2010.  Findings: Right chest Port-A-Cath in place and access.  Stable lung volumes.  Cardiac size and mediastinal contours are within normal limits.  No pneumothorax or pulmonary edema.  No pleural effusion or confluent pulmonary opacity. No acute osseous abnormality identified.  IMPRESSION: No acute cardiopulmonary abnormality.   Original Report Authenticated By: Harley Hallmark, M.D.    Ct Head Wo Contrast 01/23/2012  *RADIOLOGY REPORT*  Clinical Data: Dizziness, weakness.  CT HEAD WITHOUT CONTRAST  Technique:  Contiguous axial images were obtained from the base of the skull through the vertex without contrast.  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 No acute intracranial abnormality.  Specifically, no hemorrhage, hydrocephalus, mass lesion, acute infarction, or significant intracranial injury.  No acute calvarial abnormality. Visualized paranasal sinuses and mastoids clear.  Orbital soft tissues unremarkable.  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 No acute intracranial abnormality.   Original Report Authenticated By: Cyndie Chime, M.D.    Ct Angio Low Extrem Right W/cm &/or Wo/cm 01/23/2012   *RADIOLOGY REPORT*  Clinical Data: Right foot pain.  Cold right foot.  History of metastatic anal cancer.  CTA AORTA, BILATERAL LOWER EXTREMITY RUNOFF  Comparison: CT abdomen pelvis 10/12/2011  Contrast:  125 ml Omnipaque 300 IV.  Technique:  Multidetector helical CT imaging was performed through the abdomen, pelvis, and lower extremities following IV contrast administration.  2-D reconstructed images and 3-D reformatted images were also performed and reviewed.  Findings: Marked atherosclerotic irregularity with irregular plaque throughout the infrarenal aorta.  There is complete occlusion of the right external iliac vein at its origin.  This is new since 10/12/2011.  A short segment of reconstituted common femoral artery on the right.  The right superficial femoral artery is occluded. This is stable when compared to 10/12/2011.  Left external iliac artery is markedly irregular.  Chronic occlusion of the left superficial femoral artery.  There is reconstitution of the popliteal arteries bilaterally which are severely diseased. Calcified plaque within the right tibial peroneal trunk.  The trifurcation vessels in the right calf appear patent with the dominant vessel being the posterior tibial artery.  On the left, the trifurcation vessels are patent.  Again, the dominant vessel is the left posterior tibial artery.  The previously seen liver lesion not as well visualized on this arterial phase study.  There are gallstones within the gallbladder. Spleen, pancreas, adrenals are unremarkable.  Appendix is visualized and is normal.  Urinary bladder and prostate grossly unremarkable.  No free fluid, free air or adenopathy.  No acute bony abnormality.  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  Reconstitution of a short segment of common femoral artery with opacification of the right profunda femoris.  Bilateral superficial femoral artery is chronically occluded.  There is  reconstitution of the popliteal arteries bilaterally which are moderately to severely diseased.  The three-vessel runoff in the calves bilaterally with the dominant vessels being the posterior tibial arteries.  There is calcified plaque within the tibioperoneal trunk on the right.  Marked atherosclerotic irregularity in the infrarenal aorta.   Original Report Authenticated By: Cyndie Chime, M.D.     ASSESSMENT AND PLAN:   The patient was seen today by Dr Tenny Craw, the  patient evaluated and the data reviewed.   Preoperative evaluation to rule out surgical contraindication - Pt has never had an ischemic eval and is at high risk to have CAD. No exertional symptoms, but exertion is limited by chronic medical conditions. Will check echo and order MV today.   Otherwise, per primary MD Principal Problem:  *Atherosclerotic peripheral vascular disease with rest pain Active Problems:  DM  HYPERLIPIDEMIA  CEREBROVASCULAR ACCIDENT, HX OF  Signed: Theodore Demark 01/24/2012, 8:01 AM Co-Sign MD  Patient seen and examined.  I agree with findings as noted by R Barrett. Patient a 67 year old with severe PVOD  Due to have LE bypass.  Asked to evaluate cardiac risks. Patient denies CP but is not active.  On exam:  Lungs:  CTA; Cardiac:  RRR.  No signif murmurs.  Ext:  Feet warm  Trace LE edema.  RUE:  ++radial and ulnar pulses.  LUE Contracted (s/p CVA)  Neuro:  Deferred.  EKG:  RBBB  Sl sagging of ST segments inferior and laerally.  I had recomm a lexiscan myoview.  The patient has had this today and I have reviewed.  Patient had EKG changes worrisome for ischemia with infusion of Lexiscan.  Myoview scan with   ischemia and possible soft tissue attenuation in the inferior/inferolateral walls.  LVEF is normal.  I would recomm L heart cath to define anatomy to complete risk stratification.  Discussed with patient He agrees to proceed.  WIll plan for AM.  2.  HL Would get fasting lipids.  Patient is not on  statin.

## 2012-01-24 NOTE — Progress Notes (Signed)
ANTICOAGULATION CONSULT NOTE - Follow Up Consult  Pharmacy Consult for heparin Indication: PVD  Patient Measurements:  Height: 5\' 9"  (175.3 cm)  Weight: 176 lb 2.4 oz (79.9 kg)  IBW/kg (Calculated) : 70.7  Heparin Dosing Weight: 80kg   Labs:  Basename 01/24/12 1523 01/24/12 0502 01/23/12 1051 01/23/12 1009  HGB -- 9.3* -- 10.2*  HCT -- 30.2* -- 32.2*  PLT -- 300 -- 334  APTT -- -- -- 27  LABPROT -- 13.6 -- 12.9  INR -- 1.05 -- 0.98  HEPARINUNFRC 0.17* 0.31 -- --  CREATININE -- 1.04 -- 0.98  CKTOTAL -- -- -- --  CKMB -- -- -- --  TROPONINI -- -- <0.30 --   Goal: heparin level 0.3-0.7  Assessment: 66yo male subtherapeutic on heparin for PVD with heparin infusing at 11 ml/hr (14 units/kg/hr).  Per RN, no trouble with infusion noted.  Of note patient was off the floor for ~6h today but the heparin was not noted to be turned off at all during this time.  Plan: - Bolus 2000 units IV x 1  - Increase heparin to 1300 units/hr (~16 units/kg/hr) - Check 8h heparin level - Continue daily heparin level and CBC  Humna Moorehouse L. Illene Bolus, PharmD, BCPS Clinical Pharmacist Pager: [phone] Pharmacy: [phone] 01/24/2012 4:36 PM

## 2012-01-24 NOTE — Progress Notes (Signed)
ANTICOAGULATION CONSULT NOTE - Follow Up Consult  Pharmacy Consult for heparin Indication: PVD  Labs:  Basename 01/24/12 0502 01/23/12 1051 01/23/12 1009  HGB -- -- 10.2*  HCT -- -- 32.2*  PLT -- -- 334  APTT -- -- 27  LABPROT 13.6 -- 12.9  INR 1.05 -- 0.98  HEPARINUNFRC 0.31 -- --  CREATININE -- -- 0.98  CKTOTAL -- -- --  CKMB -- -- --  TROPONINI -- <0.30 --    Assessment/Plan:  66yo male therapeutic on heparin with initial dosing for PVD.  Will continue gtt at current rate and confirm stable with additional level.  Colleen Can PharmD BCPS 01/24/2012,6:01 AM

## 2012-01-24 NOTE — Progress Notes (Signed)
  Echocardiogram 2D Echocardiogram has been performed.  Cathie Beams 01/24/2012, 11:41 AM

## 2012-01-24 NOTE — Progress Notes (Signed)
VASCULAR LAB PRELIMINARY  ARTERIAL  ABI completed:    RIGHT    LEFT    PRESSURE WAVEFORM  PRESSURE WAVEFORM  BRACHIAL 144 Triphasic BRACHIAL 110 Triphasic  DP  Absent     AT  Absent AT 60 Monophasic  PT  Absent PT 73 Monophasic  PER  Absent       RIGHT LEFT  ABI Not ascertained No audible pulse or waveform 0.51   Right - Indicates occlusion. No audible Doppler pulse or waveform.  Left - ABI indicates a severe reduction in arterial flow with abnormal Doppler waveforms.  Gricel Copen, RVS 01/24/2012, 9:27 AM

## 2012-01-24 NOTE — Care Management Note (Unsigned)
    Page 1 of 1   01/24/2012     1:55:58 PM   CARE MANAGEMENT NOTE 01/24/2012  Patient:  Donald [name], Donald [name]   Account Number:  [id number]  Date Initiated:  01/24/2012  Documentation initiated by:  SIMMONS,Auriella Wieand  Subjective/Objective Assessment:   ADMITTED WITH FOOT PAIN; LIVES AT HOME WITH WIFE- JUDY; USES CANE AT HOME; WAS IPTA; OFF UNIT.     Action/Plan:   DISCHARGE PLANNING INITIATED.   Anticipated DC Date:  01/29/2012   Anticipated DC Plan:  HOME W HOME HEALTH SERVICES      DC Planning Services  CM consult      Choice offered to / List presented to:             Status of service:  In process, will continue to follow Medicare Important Message given?   (If response is "NO", the following Medicare IM given date fields will be blank) Date Medicare IM given:   Date Additional Medicare IM given:    Discharge Disposition:    Per UR Regulation:  Reviewed for med. necessity/level of care/duration of stay  If discussed at Long Length of Stay Meetings, dates discussed:    Comments:  01/24/12  1355  Shuntavia Yerby SIMMONS RN, BSN [phone] NCM WILL FOLLOW.

## 2012-01-24 NOTE — Progress Notes (Signed)
Pt had some pain right foot last night but improved with morphine and better this morning.  Right 5th toe still painful  Filed Vitals:   01/23/12 1715 01/23/12 1800 01/23/12 2027 01/24/12 0407  BP: 150/61 122/66 150/62 165/59  Pulse: 87 76    Temp:   98.4 F (36.9 C) 98 F (36.7 C)  TempSrc:   Oral Oral  Resp: 16 17 18 18   Height:   5\' 9"  (1.753 m)   Weight:   176 lb 2.4 oz (79.9 kg)   SpO2: 97% 97% 96% 96%   Right foot faint PT doppler, cool Left foot warmer but also PT doppler  CBC    Component Value Date/Time   WBC 7.3 01/24/2012 0502   WBC 5.8 10/12/2011 0959   RBC 3.67* 01/24/2012 0502   RBC 4.02* 10/12/2011 0959   HGB 9.3* 01/24/2012 0502   HGB 11.3* 10/12/2011 0959   HCT 30.2* 01/24/2012 0502   HCT 34.7* 10/12/2011 0959   PLT 300 01/24/2012 0502   PLT 295 10/12/2011 0959   MCV 82.3 01/24/2012 0502   MCV 86.3 10/12/2011 0959   MCH 25.3* 01/24/2012 0502   MCH 28.2 10/12/2011 0959   MCHC 30.8 01/24/2012 0502   MCHC 32.6 10/12/2011 0959   RDW 15.4 01/24/2012 0502   RDW 15.8* 10/12/2011 0959   LYMPHSABS 1.7 01/23/2012 1009   LYMPHSABS 1.3 10/12/2011 0959   MONOABS 0.7 01/23/2012 1009   MONOABS 0.5 10/12/2011 0959   EOSABS 0.1 01/23/2012 1009   EOSABS 0.2 10/12/2011 0959   BASOSABS 0.0 01/23/2012 1009   BASOSABS 0.0 10/12/2011 0959    BMET    Component Value Date/Time   NA 139 01/24/2012 0502   NA 147* 10/12/2011 0959   K 3.9 01/24/2012 0502   K 5.2* 10/12/2011 0959   CL 104 01/24/2012 0502   CL 102 10/12/2011 0959   CO2 23 01/24/2012 0502   CO2 29 10/12/2011 0959   GLUCOSE 136* 01/24/2012 0502   GLUCOSE 244* 10/12/2011 0959   BUN 9 01/24/2012 0502   BUN 13 10/12/2011 0959   CREATININE 1.04 01/24/2012 0502   CREATININE 1.0 10/12/2011 0959   CALCIUM 9.0 01/24/2012 0502   CALCIUM 9.0 10/12/2011 0959   GFRNONAA 72* 01/24/2012 0502   GFRAA 83* 01/24/2012 0502    A: 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 Continue heparin drip  Fabienne Bruns, MD [facility] Office: [phone] Pager: [phone]

## 2012-01-25 ENCOUNTER — Encounter (HOSPITAL_COMMUNITY)
Admission: EM | Disposition: A | Discharge status: Rehabilitation Facility | Payer: Self-pay | Source: Home / Self Care | Attending: Vascular Surgery

## 2012-01-25 DIAGNOSIS — I739 Peripheral vascular disease, unspecified: Secondary | ICD-10-CM

## 2012-01-25 DIAGNOSIS — I70229 Atherosclerosis of native arteries of extremities with rest pain, unspecified extremity: Secondary | ICD-10-CM

## 2012-01-25 DIAGNOSIS — I2510 Atherosclerotic heart disease of native coronary artery without angina pectoris: Secondary | ICD-10-CM

## 2012-01-25 HISTORY — PX: ABDOMINAL AORTAGRAM: SHX5454

## 2012-01-25 HISTORY — PX: LEFT HEART CATHETERIZATION WITH CORONARY ANGIOGRAM: SHX5451

## 2012-01-25 LAB — CBC
HCT: 26.6 % — ABNORMAL LOW (ref 39.0–52.0)
Hemoglobin: 8.2 g/dL — ABNORMAL LOW (ref 13.0–17.0)
RDW: 15.5 % (ref 11.5–15.5)
WBC: 5.3 10*3/uL (ref 4.0–10.5)

## 2012-01-25 LAB — BASIC METABOLIC PANEL
Chloride: 105 mEq/L (ref 96–112)
GFR calc Af Amer: 81 mL/min — ABNORMAL LOW (ref >90)
GFR calc non Af Amer: 70 mL/min — ABNORMAL LOW (ref >90)
Potassium: 3.8 mEq/L (ref 3.5–5.1)

## 2012-01-25 LAB — GLUCOSE, CAPILLARY
Glucose-Capillary: 141 mg/dL — ABNORMAL HIGH (ref 70–99)
Glucose-Capillary: 116 mg/dL — ABNORMAL HIGH (ref 70–99)

## 2012-01-25 LAB — HEPARIN LEVEL (UNFRACTIONATED): Heparin Unfractionated: 0.17 IU/mL — ABNORMAL LOW (ref 0.30–0.70)

## 2012-01-25 LAB — LIPID PANEL
LDL Cholesterol: 91 mg/dL (ref 0–99)
VLDL: 31 mg/dL (ref 0–40)

## 2012-01-25 SURGERY — ABDOMINAL AORTAGRAM
Anesthesia: LOCAL

## 2012-01-25 SURGERY — LEFT HEART CATHETERIZATION WITH CORONARY ANGIOGRAM
Anesthesia: LOCAL

## 2012-01-25 MED ORDER — LIDOCAINE HCL (PF) 1 % IJ SOLN
INTRAMUSCULAR | Status: AC
  Filled 2012-01-25: qty 30

## 2012-01-25 MED ORDER — SODIUM CHLORIDE 0.9 % IV SOLN: INTRAVENOUS | Status: AC

## 2012-01-25 MED ORDER — MORPHINE SULFATE 2 MG/ML IJ SOLN
INTRAMUSCULAR | Status: AC
  Filled 2012-01-25: qty 2

## 2012-01-25 MED ORDER — ACETAMINOPHEN 325 MG PO TABS: 650.0000 mg | ORAL_TABLET | ORAL | Status: DC | PRN

## 2012-01-25 MED ORDER — HEPARIN (PORCINE) IN NACL 100-0.45 UNIT/ML-% IJ SOLN
1550.0000 [IU]/h | INTRAMUSCULAR | Status: DC
  Filled 2012-01-25 (×2): qty 250

## 2012-01-25 MED ORDER — VERAPAMIL HCL 2.5 MG/ML IV SOLN
INTRAVENOUS | Status: AC
  Filled 2012-01-25: qty 2

## 2012-01-25 MED ORDER — FENTANYL CITRATE 0.05 MG/ML IJ SOLN
INTRAMUSCULAR | Status: AC
  Filled 2012-01-25: qty 2

## 2012-01-25 MED ORDER — MORPHINE SULFATE 2 MG/ML IJ SOLN
INTRAMUSCULAR | Status: AC
  Filled 2012-01-25: qty 1

## 2012-01-25 MED ORDER — NITROGLYCERIN 0.2 MG/ML ON CALL CATH LAB
INTRAVENOUS | Status: AC
  Filled 2012-01-25: qty 1

## 2012-01-25 MED ORDER — MIDAZOLAM HCL 2 MG/2ML IJ SOLN
INTRAMUSCULAR | Status: AC
  Filled 2012-01-25: qty 2

## 2012-01-25 MED ORDER — HEPARIN SODIUM (PORCINE) 1000 UNIT/ML IJ SOLN
INTRAMUSCULAR | Status: AC
  Filled 2012-01-25: qty 1

## 2012-01-25 NOTE — CV Procedure (Signed)
Cardiac Catheterization Operative Report  Donald [name] [id number] 9/27/20133:55 PM Evlyn Courier, MD  Procedure Performed:  1. Left Heart Catheterization 2. Selective Coronary Angiography 3. Left ventricular pressures 4. Distal aortogram with bilateral lower extremity runoff 5. Aortic arch angiography with visualization of both axillary arteries.   Operator: Verne Carrow, MD  Arterial access site:  Right radial artery.   Indication:  Pt with ischemic right leg, occlusion of right iliac artery, known bilateral SFA occlusions, risk factors for CAD. Stress test with possible ischemia in the inferior and inferolateral walls. Cardiac cath today to exclude obstructive CAD. Also asked by [facility] to perform runoff of both legs to define disease and also visualize both axillary arteries.                                  Procedure Details: The risks, benefits, complications, treatment options, and expected outcomes were discussed with the patient. The patient and/or family concurred with the proposed plan, giving informed consent. The patient was brought to the [facility] lab after IV hydration was begun and oral premedication was given. The patient was further sedated with Versed and Fentanyl. The right wrist was assessed with an Allens test which was positive. The right wrist was prepped and draped in a sterile fashion. 1% lidocaine was used for local anesthesia. Using the modified Seldinger access technique, a 5 French sheath was placed in the right radial artery. 3 mg Verapamil was given through the sheath. 5000 units IV heparin was given. Standard diagnostic catheters were used to perform selective coronary angiography. A pigtail catheter was used to measure LV pressures. I then advanced the pigtail catheter into the distal aorta and performed angiography of the distal aorta then performed visualization of the iliac arteries. Runoff was performed through both legs. I was unable to turn a  catheter up into the left subclavian artery from the right radial approach. I performed aortic arch angiography with the pigtail catheter positioned in the ascending aorta and visualized the left axillary artery. Another angiogram was performed with the pigtail catheter in place to visualize the right axillary artery. All catheters were exchanged over a wire.  The sheath was removed from the right radial artery and a Terumo hemostasis band was applied at the arteriotomy site on the right wrist.    There were no immediate complications. The patient was taken to the recovery area in stable condition.   Hemodynamic Findings: Central aortic pressure: 181/80 Left ventricular pressure:179/15/30  Angiographic Findings:  Left main:  No obstructive disease noted.   Left Anterior Descending Artery:Large vessel that courses to the apex. Moderate sized diagonal branch. No obstructive disease noted.   Circumflex Artery: Large caliber vessel with large OM branch. No obstructive disease noted.   Right Coronary Artery: 100% ostial occlusion. The mid and distal vessel fills from left to right collaterals.   Left Ventricular Angiogram: Deferred.   Distal aortogram: The distal aorta is diffusely diseased with serial 50% stenoses.   Bilateral renal arteries patent with no obstructive disease  Right common iliac with diffuse disease. The right external iliac artery has 100% occlusion. The right common femoral artery has diffuse disease but reconstitutes fully. The right SFA is occluded at the ostium. There is a long segment of occlusion with reconstitution via collaterals in the distal SFA. The popliteal artery is patent. Difficult to visualize all three vessels to the foot.   Left common  iliac artery and external iliac artery are patent with diffuse disease. The left common femoral artery is patent. The left SFA is occluded at the ositum and reconstitutes via collaterals in the distal segment. The left  popliteal artery has diffuse disease. There is 3 vessel runoff to the left foot.   Right and left axillary arteries are patent.    Impression: 1. Total occlusion RCA with good left to right collaterals. No obstructive disease LAD or Circumflex  2. Severe PAD   Recommendations: No further cardiac workup. Proceed with surgery as directed by Vascular Surgery.        Complications:  None. The patient tolerated the procedure well.

## 2012-01-25 NOTE — H&P (View-Only) (Signed)
[name] has right limib ischemia with severe PAD including newly occluded right iliac artery and known bilateral SFA occlusions. His is in danger of losing his right foot. He also has coronary risk factors and has an abnormal myoview, no chest pain. Plans are in place for cardiac cath today. I will also perform bilateral lower extremity runoff and attempt to image the bilateral axillary arteries from the right wrist. Discussed in detail with pt and family. Risk increased in setting of anemia but they wish to proceed anyway. NO PCI today if disease found given anemia.   Donald [name] 9:43 AM 01/25/2012  

## 2012-01-25 NOTE — Progress Notes (Signed)
Mr. Tinson has right limib ischemia with severe PAD including newly occluded right iliac artery and known bilateral SFA occlusions. His is in danger of losing his right foot. He also has coronary risk factors and has an abnormal myoview, no chest pain. Plans are in place for cardiac cath today. I will also perform bilateral lower extremity runoff and attempt to image the bilateral axillary arteries from the right wrist. Discussed in detail with pt and family. Risk increased in setting of anemia but they wish to proceed anyway. NO PCI today if disease found given anemia.   Zedrick Springsteen 9:43 AM 01/25/2012

## 2012-01-25 NOTE — Progress Notes (Signed)
Pt without complaint, intermittent rest pain right foot faint PT doppler  Filed Vitals:   01/24/12 1414 01/24/12 2047 01/25/12 0402 01/25/12 0827  BP: 155/69 125/71 133/60 148/74  Pulse:  75 85 75  Temp:  98.1 F (36.7 C) 98.1 F (36.7 C) 98.7 F (37.1 C)  TempSrc:  Oral Oral Oral  Resp:  18 18 18   Height:      Weight:      SpO2:  91% 95% 96%   Right foot pale and slightly cooler than left  CBC    Component Value Date/Time   WBC 5.3 01/25/2012 0640   WBC 5.8 10/12/2011 0959   RBC 3.24* 01/25/2012 0640   RBC 4.02* 10/12/2011 0959   HGB 8.2* 01/25/2012 0640   HGB 11.3* 10/12/2011 0959   HCT 26.6* 01/25/2012 0640   HCT 34.7* 10/12/2011 0959   PLT 294 01/25/2012 0640   PLT 295 10/12/2011 0959   MCV 82.1 01/25/2012 0640   MCV 86.3 10/12/2011 0959   MCH 25.3* 01/25/2012 0640   MCH 28.2 10/12/2011 0959   MCHC 30.8 01/25/2012 0640   MCHC 32.6 10/12/2011 0959   RDW 15.5 01/25/2012 0640   RDW 15.8* 10/12/2011 0959   LYMPHSABS 1.7 01/23/2012 1009   LYMPHSABS 1.3 10/12/2011 0959   MONOABS 0.7 01/23/2012 1009   MONOABS 0.5 10/12/2011 0959   EOSABS 0.1 01/23/2012 1009   EOSABS 0.2 10/12/2011 0959   BASOSABS 0.0 01/23/2012 1009   BASOSABS 0.0 10/12/2011 0959    BMET    Component Value Date/Time   NA 139 01/25/2012 0640   NA 147* 10/12/2011 0959   K 3.8 01/25/2012 0640   K 5.2* 10/12/2011 0959   CL 105 01/25/2012 0640   CL 102 10/12/2011 0959   CO2 25 01/25/2012 0640   CO2 29 10/12/2011 0959   GLUCOSE 126* 01/25/2012 0640   GLUCOSE 244* 10/12/2011 0959   BUN 10 01/25/2012 0640   BUN 13 10/12/2011 0959   CREATININE 1.06 01/25/2012 0640   CREATININE 1.0 10/12/2011 0959   CALCIUM 8.7 01/25/2012 0640   CALCIUM 9.0 10/12/2011 0959   GFRNONAA 70* 01/25/2012 0640   GFRAA 81* 01/25/2012 0640      A: Critical ischemia right foot, now with + cardiac stress test  P: Cardiac cath as well as lower extremity angio today     Revascularization next Tuesday if his foot can tolerate it     If severe CAD may need to  consider primary amputation however he may not have enough flow to heal an AKA     Anemia unknown etiology other than dilutional will guiac stools and send retic count  Fabienne Bruns, MD [facility] Office: [phone] Pager: [phone]

## 2012-01-25 NOTE — Progress Notes (Signed)
ANTICOAGULATION CONSULT NOTE - Follow Up Consult  Pharmacy Consult for heparin Indication: PVD  Patient Measurements:  Height: 5\' 9"  (175.3 cm)  Weight: 176 lb 2.4 oz (79.9 kg)  IBW/kg (Calculated) : 70.7  Heparin Dosing Weight: 80kg   Labs:  Basename 01/25/12 0938 01/25/12 0640 01/25/12 0036 01/24/12 1523 01/24/12 0502 01/23/12 1051 01/23/12 1009  HGB -- 8.2* -- -- 9.3* -- --  HCT -- 26.6* -- -- 30.2* -- 32.2*  PLT -- 294 -- -- 300 -- 334  APTT -- -- -- -- -- -- 27  LABPROT -- -- -- -- 13.6 -- 12.9  INR -- -- -- -- 1.05 -- 0.98  HEPARINUNFRC 0.17* -- 0.67 0.17* -- -- --  CREATININE -- 1.06 -- -- 1.04 -- 0.98  CKTOTAL -- -- -- -- -- -- --  CKMB -- -- -- -- -- -- --  TROPONINI -- -- -- -- -- <0.30 --      . sodium chloride 1,000 mL (01/25/12 0513)  . sodium chloride    . heparin 16 Units/kg/hr (01/24/12 1729)     Goal: heparin level 0.3-0.7  Assessment: 66yo male subtherapeutic on heparin for PVD with heparin infusing at 1300 units/hr.  He was therapeutic on this rate overnight but it could have been influenced by bolus doses administered.  With concerns for anemia and potential procedures today will defer bolus at this time.  Plan: Increase Heparin to 1550 units/hr Follow-up after cath for additional anticoagulation needs  Estella Husk, Pharm.D., BCPS Clinical Pharmacist  Phone [phone] Pager [phone] 01/25/2012, 11:11 AM

## 2012-01-25 NOTE — Interval H&P Note (Signed)
History and Physical Interval Note:  SEe full cardiology consult note.   01/25/2012 2:51 PM  Donald [name]  has presented today for surgery, with the diagnosis of pvd  The various methods of treatment have been discussed with the patient and family. After consideration of risks, benefits and other options for treatment, the patient has consented to  Procedure(s) (LRB) with comments: ABDOMINAL AORTAGRAM (N/A) LEFT HEART CATHETERIZATION WITH CORONARY ANGIOGRAM (N/A) as a surgical intervention .  The patient's history has been reviewed, patient examined, no change in status, stable for surgery.  I have reviewed the patient's chart and labs.  Questions were answered to the patient's satisfaction.     MCALHANY,CHRISTOPHER

## 2012-01-25 NOTE — Progress Notes (Signed)
ANTICOAGULATION CONSULT NOTE - Follow Up Consult  Pharmacy Consult for heparin Indication: PVD  Labs:  Basename 01/25/12 0036 01/24/12 1523 01/24/12 0502 01/23/12 1051 01/23/12 1009  HGB -- -- 9.3* -- 10.2*  HCT -- -- 30.2* -- 32.2*  PLT -- -- 300 -- 334  APTT -- -- -- -- 27  LABPROT -- -- 13.6 -- 12.9  INR -- -- 1.05 -- 0.98  HEPARINUNFRC 0.67 0.17* 0.31 -- --  CREATININE -- -- 1.04 -- 0.98  CKTOTAL -- -- -- -- --  CKMB -- -- -- -- --  TROPONINI -- -- -- <0.30 --    Assessment/Plan:  66yo male now therapeutic on heparin after rate increase.  Will continue gtt at current rate and confirm stable with additional level.  Colleen Can PharmD BCPS 01/25/2012,1:08 AM

## 2012-01-26 DIAGNOSIS — E785 Hyperlipidemia, unspecified: Secondary | ICD-10-CM

## 2012-01-26 LAB — CBC
HCT: 29 % — ABNORMAL LOW (ref 39.0–52.0)
MCV: 81.9 fL (ref 78.0–100.0)
Platelets: 309 10*3/uL (ref 150–400)
RBC: 3.54 MIL/uL — ABNORMAL LOW (ref 4.22–5.81)
RDW: 15.2 % (ref 11.5–15.5)
WBC: 6.4 10*3/uL (ref 4.0–10.5)

## 2012-01-26 LAB — RETICULOCYTES: RBC.: 3.54 MIL/uL — ABNORMAL LOW (ref 4.22–5.81)

## 2012-01-26 LAB — GLUCOSE, CAPILLARY
Glucose-Capillary: 136 mg/dL — ABNORMAL HIGH (ref 70–99)
Glucose-Capillary: 173 mg/dL — ABNORMAL HIGH (ref 70–99)

## 2012-01-26 LAB — BASIC METABOLIC PANEL
CO2: 23 mEq/L (ref 19–32)
Chloride: 103 mEq/L (ref 96–112)
GFR calc Af Amer: >90 mL/min (ref >90)
Potassium: 3.8 mEq/L (ref 3.5–5.1)

## 2012-01-26 MED ORDER — LISINOPRIL 2.5 MG PO TABS
2.5000 mg | ORAL_TABLET | Freq: Every day | ORAL | Status: DC
  Administered 2012-01-26 – 2012-01-31 (×5): 2.5 mg via ORAL
  Filled 2012-01-26 (×6): qty 1

## 2012-01-26 MED ORDER — INSULIN ASPART 100 UNIT/ML ~~LOC~~ SOLN
0.0000 [IU] | Freq: Three times a day (TID) | SUBCUTANEOUS | Status: DC
  Administered 2012-01-27: 2 [IU] via SUBCUTANEOUS
  Administered 2012-01-27 (×2): 3 [IU] via SUBCUTANEOUS
  Administered 2012-01-28: 2 [IU] via SUBCUTANEOUS
  Administered 2012-01-28: 3 [IU] via SUBCUTANEOUS
  Administered 2012-01-28: 2 [IU] via SUBCUTANEOUS
  Administered 2012-01-29: 3 [IU] via SUBCUTANEOUS
  Administered 2012-01-30 (×3): 2 [IU] via SUBCUTANEOUS
  Administered 2012-01-31: 3 [IU] via SUBCUTANEOUS
  Administered 2012-01-31: 2 [IU] via SUBCUTANEOUS

## 2012-01-26 MED ORDER — INSULIN ASPART 100 UNIT/ML ~~LOC~~ SOLN: 0.0000 [IU] | Freq: Every day | SUBCUTANEOUS | Status: DC

## 2012-01-26 MED ORDER — ATORVASTATIN CALCIUM 40 MG PO TABS
40.0000 mg | ORAL_TABLET | Freq: Every day | ORAL | Status: DC
  Administered 2012-01-26 – 2012-01-30 (×5): 40 mg via ORAL
  Filled 2012-01-26 (×7): qty 1

## 2012-01-26 NOTE — Progress Notes (Addendum)
[facility]  Progress Note - Surgery  Date of Surgery: 01/23/2012 - 01/25/2012 ABDOMINAL AORTAGRAM LEFT HEART CATHETERIZATION WITH CORONARY ANGIOGRAM Surgeon: Surgeon(s): Kathleene Hazel, MD  1 Day Post-Op  History of Present Illness  Donald [name] is a 66 y.o. male who is S/P  ABDOMINAL AORTAGRAM LEFT HEART CATHETERIZATION WITH CORONARY ANGIOGRAM. The patient's pre-op symptoms of decreased sensation and pain in the right leg/foot are Unchanged. he has stable motion in the foot. Patients pain is well controlled.  Pt had card cath and aortogram yesterday by Dr. Dicie Beam which showed Total occlusion RCA with good left to right collaterals. No obstructive disease LAD or Circumflex and Recommendations: No further cardiac workup. Proceed with surgery as directed by Vascular Surgery    Imaging: Nm Myocar Multi W/spect W/wall Motion / Ef  01/24/2012  Identification:  The patient is a 66 year old with severe peripheral vascular disease.  Being evaluated for peripheral vascular surgery.  Test to evaluate, rule out ischemia  Stress data:  The patient underwent stress testing using Lexiscan per protocol Baseline EKG showed NSR 74 beats per minute.  There was slight ST depression noted in the infero lateral leads (less than 1 mm).  With the infusion of Lexiscan the patient developed  ST depression in leads V3-V6 t ( 1 mm in V3 and V4;  0.5 to 1 mm in leads V5/V6) also 1 mm in lead II;  0.5 to 1 mm in leads III and AVF.  Overall electrically positive.  He experienced no chest pain  Nuclear data:   The patient was studied in 1-day rest stress protocol.  He was injected with 10 mCi technetium 99 labeled Myoview at rest, 30 mCi technetium  99 labeled Myoview at stress. Images were reconstructed the short, vertical and horizontal axes.  In the initial stress images there is a defect involving the inferoseptal wall (base mid),  inferior wall (base, mid, minimally distal),  inferolateral wall (base, mid, distal).  Otherwise normal perfusion.  In the recovery images there was partial improvement in the basal inferoseptal, theinferior ( mid distal) and inferolateral (distal) walls.  On gating LVEF was calculated at 67% with normal wall motion.  On review of the raw data some of the changes may reflect soft tissue attenuation (diaphragm).  Impression:  Lexiscan Myoview.  Electrically positive for ischemia. Myoview scan with ischemia in the inferior,  inferolateral distribution with possible coexistent soft tissue attenuation (diaphragm) vs subendocardial scar.  LVEF on gating calculated 67%.   Original Report Authenticated By: [email or web address]     Significant Diagnostic Studies: CBC Lab Results  Component Value Date   WBC 6.4 01/26/2012   HGB 9.1* 01/26/2012   HCT 29.0* 01/26/2012   MCV 81.9 01/26/2012   PLT 309 01/26/2012    BMET     Component Value Date/Time   NA 139 01/26/2012 0455   NA 147* 10/12/2011 0959   K 3.8 01/26/2012 0455   K 5.2* 10/12/2011 0959   CL 103 01/26/2012 0455   CL 102 10/12/2011 0959   CO2 23 01/26/2012 0455   CO2 29 10/12/2011 0959   GLUCOSE 116* 01/26/2012 0455   GLUCOSE 244* 10/12/2011 0959   BUN 10 01/26/2012 0455   BUN 13 10/12/2011 0959   CREATININE 0.98 01/26/2012 0455   CREATININE 1.0 10/12/2011 0959   CALCIUM 9.1 01/26/2012 0455   CALCIUM 9.0 10/12/2011 0959   GFRNONAA 83* 01/26/2012 0455   GFRAA >90 01/26/2012 0455    COAG Lab  Results  Component Value Date   INR 1.05 01/24/2012   INR 0.98 01/23/2012   INR 0.95 02/07/2011   No results found for this basename: PTT    Physical Examination  BP Readings from Last 3 Encounters:  01/26/12 148/59  01/26/12 148/59  01/26/12 148/59   Temp Readings from Last 3 Encounters:  01/26/12 97.5 F (36.4 C) Oral  01/26/12 97.5 F (36.4 C) Oral  01/26/12 97.5 F (36.4 C) Oral   SpO2 Readings from Last 3 Encounters:  01/26/12 96%  01/26/12 96%  01/26/12 96%   Pulse Readings from Last 3  Encounters:  01/26/12 89  01/26/12 89  01/26/12 89    Pt is A&O x 3 right lower extremity:Right foot pale and slightly cooler than left  Deacresed sensation , equal motion bilat  Right Posterior tibial pulse is  weak and monophasic by Doppler  Aortogram: Distal aortogram: The distal aorta is diffusely diseased with serial 50% stenoses.  Bilateral renal arteries patent with no obstructive disease  Right common iliac with diffuse disease. The right external iliac artery has 100% occlusion. The right common femoral artery has diffuse disease but reconstitutes fully. The right SFA is occluded at the ostium. There is a long segment of occlusion with reconstitution via collaterals in the distal SFA. The popliteal artery is patent. Difficult to visualize all three vessels to the foot.  Left common iliac artery and external iliac artery are patent with diffuse disease. The left common femoral artery is patent. The left SFA is occluded at the ositum and reconstitutes via collaterals in the distal segment. The left popliteal artery has diffuse disease. There is 3 vessel runoff to the left foot.  Right and left axillary arteries are patent.    Assessment/Plan: Pt. Doing well with unchanged symptoms in right foot leg with Right external iliac occlusion pain is controlled Continue heparin Plan Bypass Surgery for next tuesday  [name] [phone] 01/26/2012 8:39 AM  I reviewed his angiogram. He has diffuse disease of his infrarenal aorta. He has bilateral superficial femoral artery occlusions. The deep femoral arteries are patent. There is reconstitution of the above-knee popliteal artery on the right although there may be some plaque in the popliteal artery behind the knee. Dominant runoff on the right as the posterior tibial artery. Anterior tibial artery is not well visualized. On the left side the below-knee pop artery is patent with three-vessel runoff. There is no significant arch  disease in the right subclavian artery is patent.  Cardiac cath showed total occlusion of the right coronary artery with good left to right collaterals. He was cleared from a cardiac standpoint for surgery. His symptoms have remained stable. He scheduled for right axillobifemoral bypass graft and possible right femoropopliteal bypass graft on Tuesday I. Dr. Darrick Penna.  Di Kindle. Edilia Bo, MD, FACS Beeper [phone] 01/26/2012

## 2012-01-26 NOTE — Progress Notes (Signed)
@   Subjective:  Denies CP or dyspnea   Objective:  Filed Vitals:   01/25/12 1446 01/25/12 1819 01/25/12 1929 01/26/12 0414  BP:  151/59 143/57 148/59  Pulse: 79 63 82 89  Temp:  97.5 F (36.4 C) 97.7 F (36.5 C) 97.5 F (36.4 C)  TempSrc:  Oral Oral Oral  Resp:  18 18 18   Height:      Weight:      SpO2:  97% 97% 96%    Intake/Output from previous day:  Intake/Output Summary (Last 24 hours) at 01/26/12 0902 Last data filed at 01/25/12 1900  Gross per 24 hour  Intake      0 ml  Output   1100 ml  Net  -1100 ml    Physical Exam: Physical exam: Well-developed well-nourished in no acute distress.  Skin is warm and dry.  HEENT is normal.  Neck is supple. No thyromegaly.  Chest is clear to auscultation with normal expansion.  Cardiovascular exam is regular rate and rhythm.  Abdominal exam nontender or distended. No masses palpated. Extremities show no edema. Right radial cath site with no hematoma     Lab Results: Basic Metabolic Panel:  Basename 01/26/12 0455 01/25/12 0640  NA 139 139  K 3.8 3.8  CL 103 105  CO2 23 25  GLUCOSE 116* 126*  BUN 10 10  CREATININE 0.98 1.06  CALCIUM 9.1 8.7  MG -- --  PHOS -- --   CBC:  Basename 01/26/12 0455 01/25/12 0640 01/23/12 1009  WBC 6.4 5.3 --  NEUTROABS -- -- 3.5  HGB 9.1* 8.2* --  HCT 29.0* 26.6* --  MCV 81.9 82.1 --  PLT 309 294 --   Cardiac Enzymes:  Basename 01/23/12 1051  CKTOTAL --  CKMB --  CKMBINDEX --  TROPONINI <0.30     Assessment/Plan:  #1 coronary artery disease-see cath results. Plan medical therapy. Add aspirin when okay with vascular surgery. Add statin. #2 peripheral vascular disease-surgery scheduled for next week. #3 anemia-workup per primary service. #4 diabetes mellitus #5 hypertension-add ACE inhibitor. #6 hyperlipidemia-add statin. Patient will FU with Dr Tenny Craw as an outpatient following DC for CAD.  Olga Millers 01/26/2012, 9:02 AM

## 2012-01-27 LAB — CBC
Platelets: 306 10*3/uL (ref 150–400)
RBC: 3.35 MIL/uL — ABNORMAL LOW (ref 4.22–5.81)
RDW: 15.3 % (ref 11.5–15.5)
WBC: 6.8 10*3/uL (ref 4.0–10.5)

## 2012-01-27 LAB — GLUCOSE, CAPILLARY: Glucose-Capillary: 141 mg/dL — ABNORMAL HIGH (ref 70–99)

## 2012-01-27 MED ORDER — FENTANYL 50 MCG/HR TD PT72
50.0000 ug | MEDICATED_PATCH | TRANSDERMAL | Status: DC
  Administered 2012-01-27: 50 ug via TRANSDERMAL
  Filled 2012-01-27 (×2): qty 1

## 2012-01-27 MED ORDER — SODIUM CHLORIDE 0.9 % IJ SOLN
10.0000 mL | INTRAMUSCULAR | Status: DC | PRN
  Administered 2012-01-27 – 2012-01-28 (×2): 10 mL

## 2012-01-27 MED ORDER — ASPIRIN 81 MG PO CHEW
81.0000 mg | CHEWABLE_TABLET | Freq: Every day | ORAL | Status: DC
  Administered 2012-01-27 – 2012-01-31 (×4): 81 mg via ORAL
  Filled 2012-01-27 (×4): qty 1

## 2012-01-27 NOTE — Progress Notes (Signed)
Patient stated that he has not had a BM since weds. Patient stated that he is constipated and needs some relief to move his bowels. Patient stated that he feels miserable. Donald [name]

## 2012-01-27 NOTE — Progress Notes (Addendum)
[facility]  Progress Note Bypass Surgery  Date of Surgery: 01/23/2012 - 01/25/2012  Procedure(s): ABDOMINAL AORTAGRAM LEFT HEART CATHETERIZATION WITH CORONARY ANGIOGRAM Surgeon: Surgeon(s): Kathleene Hazel, MD  2 Days Post-Op  History of Present Illness  Donald [name] is a 66 y.o. male who has known Right ext iliac occlusive disease  The patient's pre-op symptoms of pain are Unchanged . Patients pain is fairly well controlled.    Significant Diagnostic Studies: CBC Lab Results  Component Value Date   WBC 6.8 01/27/2012   HGB 8.6* 01/27/2012   HCT 27.7* 01/27/2012   MCV 82.7 01/27/2012   PLT 306 01/27/2012    BMET     Component Value Date/Time   NA 139 01/26/2012 0455   NA 147* 10/12/2011 0959   K 3.8 01/26/2012 0455   K 5.2* 10/12/2011 0959   CL 103 01/26/2012 0455   CL 102 10/12/2011 0959   CO2 23 01/26/2012 0455   CO2 29 10/12/2011 0959   GLUCOSE 116* 01/26/2012 0455   GLUCOSE 244* 10/12/2011 0959   BUN 10 01/26/2012 0455   BUN 13 10/12/2011 0959   CREATININE 0.98 01/26/2012 0455   CREATININE 1.0 10/12/2011 0959   CALCIUM 9.1 01/26/2012 0455   CALCIUM 9.0 10/12/2011 0959   GFRNONAA 83* 01/26/2012 0455   GFRAA >90 01/26/2012 0455    COAG Lab Results  Component Value Date   INR 1.05 01/24/2012   INR 0.98 01/23/2012   INR 0.95 02/07/2011   No results found for this basename: PTT    Physical Examination  BP Readings from Last 3 Encounters:  01/27/12 130/51  01/27/12 130/51  01/27/12 130/51   Temp Readings from Last 3 Encounters:  01/27/12 98.4 F (36.9 C) Oral  01/27/12 98.4 F (36.9 C) Oral  01/27/12 98.4 F (36.9 C) Oral   SpO2 Readings from Last 3 Encounters:  01/27/12 93%  01/27/12 93%  01/27/12 93%   Pulse Readings from Last 3 Encounters:  01/27/12 90  01/27/12 90  01/27/12 90   Pt is A&O x 3 right lower extremity:Limb is cool; with palor  Assessment/Plan: Pt. Doing well Post-op pain is not well controlled  on Morphine only Give po meds for pain  [name] [phone] 01/27/2012 8:42 AM  Agree with above. He has been cleared for surgery on Tuesday. Patient is scheduled for a left axillary bifemoral bypass graft on Tuesday by Dr. Darrick Penna.  Di Kindle. Edilia Bo, MD, FACS Beeper [phone] 01/27/2012

## 2012-01-28 LAB — PREPARE RBC (CROSSMATCH)

## 2012-01-28 LAB — GLUCOSE, CAPILLARY
Glucose-Capillary: 135 mg/dL — ABNORMAL HIGH (ref 70–99)
Glucose-Capillary: 187 mg/dL — ABNORMAL HIGH (ref 70–99)

## 2012-01-28 LAB — CBC
Hemoglobin: 8.3 g/dL — ABNORMAL LOW (ref 13.0–17.0)
MCH: 25.9 pg — ABNORMAL LOW (ref 26.0–34.0)
MCHC: 31.7 g/dL (ref 30.0–36.0)
MCV: 81.6 fL (ref 78.0–100.0)
RBC: 3.21 MIL/uL — ABNORMAL LOW (ref 4.22–5.81)

## 2012-01-28 MED ORDER — HEPARIN (PORCINE) IN NACL 100-0.45 UNIT/ML-% IJ SOLN
1550.0000 [IU]/h | INTRAMUSCULAR | Status: DC
  Administered 2012-01-28: 1550 [IU]/h via INTRAVENOUS
  Filled 2012-01-28 (×2): qty 250

## 2012-01-28 MED ORDER — FUROSEMIDE 10 MG/ML IJ SOLN
20.0000 mg | Freq: Once | INTRAMUSCULAR | Status: AC
  Administered 2012-01-28: 20 mg via INTRAVENOUS

## 2012-01-28 NOTE — Plan of Care (Signed)
Problem: Phase II Progression Outcomes Goal: Ambulates up to 600 ft. in hall x 1 Outcome: Not Met (add Reason) Unable to ambulate at this time

## 2012-01-28 NOTE — Progress Notes (Addendum)
[facility]  Subjective  - POD #3 Date of Surgery: 01/23/2012 - 01/25/2012  Procedure(s):  ABDOMINAL AORTAGRAM  LEFT HEART CATHETERIZATION WITH CORONARY ANGIOGRAM  Surgeon: Surgeon(s):  Christopher D McAlhany, MD  History of Present Illness  Vito R Suit is a 66 y.o. male who has known Right ext iliac occlusive disease The patient's pre-op symptoms of pain are Unchanged . Patients pain is fairly well controlled.    Objective 114/63 84 98.2 F (36.8 C) (Oral) 18 92%  Intake/Output Summary (Last 24 hours) at 01/28/12 0805 Last data filed at 01/28/12 0032  Gross per 24 hour  Intake    240 ml  Output    225 ml  Net     15 ml   Pt is A&O x 3  right lower extremity:Limb is cool; with palor  No palpable or doppler signal achieved. Sensation intact and active range of motion intact of right lower extremity.  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 Fields.   Patient scheduled for right axillobifemoral bypass graft tomorrow. May also potentially require right femoropopliteal bypass graft. His hemoglobin is 8.2 and I think he should be transfused preoperatively.  Christopher S. Dickson, MD, FACS Beeper [phone] 01/28/2012     

## 2012-01-28 NOTE — Progress Notes (Signed)
ANTICOAGULATION CONSULT NOTE - Initial Consult  Pharmacy Consult for Heparin Indication:  LE Ischemia (   Allergies  Allergen Reactions  . Other     Pt states allergic to "muscle relaxer" but unsure of name.    Patient Measurements: Height: 5\' 9"  (175.3 cm) Weight: 176 lb 2.4 oz (79.9 kg) IBW/kg (Calculated) : 70.7  Heparin dosing weight:   Vital Signs: Temp: 97.7 F (36.5 C) (09/30 0900) Temp src: Oral (09/30 0400) BP: 118/57 mmHg (09/30 1037) Pulse Rate: 81  (09/30 0900)  Labs:  Basename 01/28/12 0420 01/27/12 0430 01/26/12 0455  HGB 8.3* 8.6* --  HCT 26.2* 27.7* 29.0*  PLT 297 306 309  APTT -- -- --  LABPROT -- -- --  INR -- -- --  HEPARINUNFRC -- -- --  CREATININE -- -- 0.98  CKTOTAL -- -- --  CKMB -- -- --  TROPONINI -- -- --    Estimated Creatinine Clearance: 72.1 ml/min (by C-G formula based on Cr of 0.98).   Medical History: Past Medical History  Diagnosis Date  . GERD (gastroesophageal reflux disease)   . Hyperlipidemia   . Hypertension   . Peripheral vascular disease   . Type II diabetes mellitus   . Pneumonia   . Internal and external bleeding hemorrhoids   . Stroke 2008    "left side weak; unable to move left hand still" (01/23/2012)  . History of gout     left great toe  . anal ca dx'd 08/2007    S/P chemo, radiation, biopsies  . Metastases to the liver dx'd 08/2010    Medications:  Prescriptions prior to admission  Medication Sig Dispense Refill  . acidophilus (RISAQUAD) CAPS Take by mouth daily.        . bisacodyl (DULCOLAX) 5 MG EC tablet Take 20 mg by mouth daily.       Marland Kitchen dipyridamole-aspirin (AGGRENOX) 25-200 MG per 12 hr capsule Take 1 capsule by mouth 2 (two) times daily.       Marland Kitchen gabapentin (NEURONTIN) 100 MG capsule Take 200 mg by mouth at bedtime.      Marland Kitchen lisinopril (PRINIVIL,ZESTRIL) 5 MG tablet Take 5 mg by mouth daily.        . metFORMIN (GLUMETZA) 500 MG (MOD) 24 hr tablet Take 500 mg by mouth 2 (two) times daily with a meal.        . omega-3 acid ethyl esters (LOVAZA) 1 G capsule Take 2 g by mouth 2 (two) times daily.        Marland Kitchen omeprazole (PRILOSEC) 20 MG capsule Take 40 mg by mouth daily.        Scheduled:    . aspirin  81 mg Oral Daily  . atorvastatin  40 mg Oral q1800  . docusate sodium  100 mg Oral BID  . fentaNYL  50 mcg Transdermal Q72H  . furosemide  20 mg Intravenous Once  . insulin aspart  0-15 Units Subcutaneous TID WC  . insulin aspart  0-5 Units Subcutaneous QHS  . lisinopril  2.5 mg Oral Daily  . pantoprazole  40 mg Oral Q1200    Assessment: 66 yo male to resume IV heparin infusion for new occlusion right external iliac artery.  Patient is scheduled for a left axillary bifemoral bypass graft tomorrow by Dr. Darrick Penna. He is s/p cardiac cath on 01/25/12.  He was receiving IV Heparin infusion 1550 units/hr prior to cardiac cath. No bleeding reported.    Goal of Therapy:  Heparin level 0.3-0.7  units/ml Monitor platelets by anticoagulation protocol: Yes   Plan:  Start IV heparin drip 1550 units/hr (no bolus s/p cardiac cath 9/27).  Heparin infusion to stop at 6am tomorrow for left axillary bifemoral bypass graft.   Noah Delaine, RPh Clinical Pharmacist Pager: [phone] 01/28/2012,1:16 PM

## 2012-01-29 ENCOUNTER — Encounter (HOSPITAL_COMMUNITY): Payer: Self-pay | Admitting: Anesthesiology

## 2012-01-29 ENCOUNTER — Inpatient Hospital Stay (HOSPITAL_COMMUNITY): Payer: Medicare Other | Admitting: Anesthesiology

## 2012-01-29 ENCOUNTER — Encounter (HOSPITAL_COMMUNITY)
Admission: EM | Disposition: A | Discharge status: Rehabilitation Facility | Payer: Self-pay | Source: Home / Self Care | Attending: Vascular Surgery

## 2012-01-29 ENCOUNTER — Ambulatory Visit: Payer: Medicare Other | Admitting: Internal Medicine

## 2012-01-29 DIAGNOSIS — I743 Embolism and thrombosis of arteries of the lower extremities: Secondary | ICD-10-CM

## 2012-01-29 DIAGNOSIS — I70229 Atherosclerosis of native arteries of extremities with rest pain, unspecified extremity: Secondary | ICD-10-CM

## 2012-01-29 HISTORY — PX: AXILLARY-FEMORAL BYPASS GRAFT: SHX894

## 2012-01-29 LAB — GLUCOSE, CAPILLARY
Glucose-Capillary: 129 mg/dL — ABNORMAL HIGH (ref 70–99)
Glucose-Capillary: 173 mg/dL — ABNORMAL HIGH (ref 70–99)

## 2012-01-29 LAB — COMPREHENSIVE METABOLIC PANEL
ALT: 27 U/L (ref 0–53)
Alkaline Phosphatase: 86 U/L (ref 39–117)
BUN: 10 mg/dL (ref 6–23)
CO2: 24 mEq/L (ref 19–32)
Chloride: 99 mEq/L (ref 96–112)
GFR calc Af Amer: 79 mL/min — ABNORMAL LOW (ref >90)
GFR calc non Af Amer: 68 mL/min — ABNORMAL LOW (ref >90)
Glucose, Bld: 154 mg/dL — ABNORMAL HIGH (ref 70–99)
Potassium: 3.9 mEq/L (ref 3.5–5.1)
Sodium: 135 mEq/L (ref 135–145)
Total Bilirubin: 0.6 mg/dL (ref 0.3–1.2)

## 2012-01-29 LAB — CBC
HCT: 33.1 % — ABNORMAL LOW (ref 39.0–52.0)
HCT: 34.7 % — ABNORMAL LOW (ref 39.0–52.0)
Hemoglobin: 10.8 g/dL — ABNORMAL LOW (ref 13.0–17.0)
Hemoglobin: 11.2 g/dL — ABNORMAL LOW (ref 13.0–17.0)
MCH: 26.2 pg (ref 26.0–34.0)
MCHC: 32.3 g/dL (ref 30.0–36.0)
RBC: 4.05 MIL/uL — ABNORMAL LOW (ref 4.22–5.81)
RDW: 14.8 % (ref 11.5–15.5)

## 2012-01-29 SURGERY — CREATION, BYPASS, ARTERIAL, AXILLARY TO BILATERAL FEMORAL, USING GRAFT
Anesthesia: General | Site: Groin | Wound class: Clean

## 2012-01-29 MED ORDER — POTASSIUM CHLORIDE CRYS ER 20 MEQ PO TBCR: 20.0000 meq | EXTENDED_RELEASE_TABLET | Freq: Every day | ORAL | Status: DC | PRN

## 2012-01-29 MED ORDER — LABETALOL HCL 5 MG/ML IV SOLN
INTRAVENOUS | Status: DC | PRN
  Administered 2012-01-29 (×2): 5 mg via INTRAVENOUS

## 2012-01-29 MED ORDER — PHENYLEPHRINE HCL 10 MG/ML IJ SOLN
10.0000 mg | INTRAVENOUS | Status: DC | PRN
  Administered 2012-01-29: 15 ug/min via INTRAVENOUS

## 2012-01-29 MED ORDER — DIPHENHYDRAMINE HCL 12.5 MG/5ML PO ELIX
12.5000 mg | ORAL_SOLUTION | Freq: Four times a day (QID) | ORAL | Status: DC | PRN
  Filled 2012-01-29: qty 5

## 2012-01-29 MED ORDER — FENTANYL CITRATE 0.05 MG/ML IJ SOLN
INTRAMUSCULAR | Status: DC | PRN
  Administered 2012-01-29 (×10): 50 ug via INTRAVENOUS

## 2012-01-29 MED ORDER — DIPHENHYDRAMINE HCL 50 MG/ML IJ SOLN: 12.5000 mg | Freq: Four times a day (QID) | INTRAMUSCULAR | Status: DC | PRN

## 2012-01-29 MED ORDER — NALOXONE HCL 0.4 MG/ML IJ SOLN: 0.4000 mg | INTRAMUSCULAR | Status: DC | PRN

## 2012-01-29 MED ORDER — CEFUROXIME SODIUM 1.5 G IJ SOLR
1.5000 g | Freq: Two times a day (BID) | INTRAMUSCULAR | Status: AC
  Administered 2012-01-29 – 2012-01-30 (×2): 1.5 g via INTRAVENOUS
  Filled 2012-01-29 (×2): qty 1.5

## 2012-01-29 MED ORDER — SODIUM CHLORIDE 0.9 % IR SOLN
Status: DC | PRN
  Administered 2012-01-29: 12:00:00

## 2012-01-29 MED ORDER — EPHEDRINE SULFATE 50 MG/ML IJ SOLN
INTRAMUSCULAR | Status: DC | PRN
  Administered 2012-01-29: 10 mg via INTRAVENOUS

## 2012-01-29 MED ORDER — GLYCOPYRROLATE 0.2 MG/ML IJ SOLN
INTRAMUSCULAR | Status: DC | PRN
  Administered 2012-01-29: .8 mg via INTRAVENOUS

## 2012-01-29 MED ORDER — THROMBIN 20000 UNITS EX SOLR
CUTANEOUS | Status: AC
  Filled 2012-01-29: qty 20000

## 2012-01-29 MED ORDER — ONDANSETRON HCL 4 MG/2ML IJ SOLN
INTRAMUSCULAR | Status: DC | PRN
  Administered 2012-01-29: 4 mg via INTRAVENOUS

## 2012-01-29 MED ORDER — LACTATED RINGERS IV SOLN
INTRAVENOUS | Status: DC
  Administered 2012-01-29: 10:00:00 via INTRAVENOUS

## 2012-01-29 MED ORDER — THROMBIN 20000 UNITS EX KIT
PACK | CUTANEOUS | Status: DC | PRN
  Administered 2012-01-29: 14:00:00 via TOPICAL

## 2012-01-29 MED ORDER — ALBUMIN HUMAN 5 % IV SOLN
INTRAVENOUS | Status: DC | PRN
  Administered 2012-01-29 (×2): via INTRAVENOUS

## 2012-01-29 MED ORDER — 0.9 % SODIUM CHLORIDE (POUR BTL) OPTIME
TOPICAL | Status: DC | PRN
  Administered 2012-01-29: 2000 mL

## 2012-01-29 MED ORDER — SODIUM CHLORIDE 0.9 % IV SOLN: 500.0000 mL | Freq: Once | INTRAVENOUS | Status: AC | PRN

## 2012-01-29 MED ORDER — CEFAZOLIN SODIUM 1-5 GM-% IV SOLN
INTRAVENOUS | Status: AC
  Filled 2012-01-29: qty 50

## 2012-01-29 MED ORDER — LIDOCAINE HCL (CARDIAC) 20 MG/ML IV SOLN
INTRAVENOUS | Status: DC | PRN
  Administered 2012-01-29: 70 mg via INTRAVENOUS

## 2012-01-29 MED ORDER — CEFAZOLIN SODIUM-DEXTROSE 2-3 GM-% IV SOLR
INTRAVENOUS | Status: AC
  Filled 2012-01-29: qty 50

## 2012-01-29 MED ORDER — HYDROMORPHONE HCL PF 1 MG/ML IJ SOLN: 0.2500 mg | INTRAMUSCULAR | Status: DC | PRN

## 2012-01-29 MED ORDER — VECURONIUM BROMIDE 10 MG IV SOLR
INTRAVENOUS | Status: DC | PRN
  Administered 2012-01-29 (×5): 2 mg via INTRAVENOUS
  Administered 2012-01-29: 1 mg via INTRAVENOUS
  Administered 2012-01-29: 2 mg via INTRAVENOUS

## 2012-01-29 MED ORDER — CEFAZOLIN SODIUM 1-5 GM-% IV SOLN
INTRAVENOUS | Status: DC | PRN
  Administered 2012-01-29: 1 g via INTRAVENOUS

## 2012-01-29 MED ORDER — LACTATED RINGERS IV SOLN
INTRAVENOUS | Status: DC | PRN
  Administered 2012-01-29: 11:00:00 via INTRAVENOUS

## 2012-01-29 MED ORDER — SODIUM CHLORIDE 0.9 % IV SOLN
INTRAVENOUS | Status: DC
  Administered 2012-01-30 (×2): via INTRAVENOUS

## 2012-01-29 MED ORDER — ROCURONIUM BROMIDE 100 MG/10ML IV SOLN
INTRAVENOUS | Status: DC | PRN
  Administered 2012-01-29: 50 mg via INTRAVENOUS

## 2012-01-29 MED ORDER — PROTAMINE SULFATE 10 MG/ML IV SOLN
INTRAVENOUS | Status: DC | PRN
  Administered 2012-01-29: 100 mg via INTRAVENOUS

## 2012-01-29 MED ORDER — SODIUM CHLORIDE 0.9 % IJ SOLN: 9.0000 mL | INTRAMUSCULAR | Status: DC | PRN

## 2012-01-29 MED ORDER — HYDROMORPHONE 0.3 MG/ML IV SOLN
INTRAVENOUS | Status: DC
  Administered 2012-01-29: 17:00:00 via INTRAVENOUS
  Administered 2012-01-30: 1.8 mg via INTRAVENOUS
  Administered 2012-01-30: 0.9 mg via INTRAVENOUS
  Administered 2012-01-30: 16:00:00 via INTRAVENOUS
  Administered 2012-01-30: 0.9 mg via INTRAVENOUS
  Administered 2012-01-30: 2.7 mg via INTRAVENOUS
  Administered 2012-01-30: 0.0769 mL via INTRAVENOUS
  Administered 2012-01-31: 1.2 mg via INTRAVENOUS
  Administered 2012-01-31: 0.3 mg via INTRAVENOUS
  Administered 2012-01-31: 3.3 mg via INTRAVENOUS
  Filled 2012-01-29: qty 25

## 2012-01-29 MED ORDER — NEOSTIGMINE METHYLSULFATE 1 MG/ML IJ SOLN
INTRAMUSCULAR | Status: DC | PRN
  Administered 2012-01-29: 5 mg via INTRAVENOUS

## 2012-01-29 MED ORDER — HEPARIN SODIUM (PORCINE) 1000 UNIT/ML IJ SOLN
INTRAMUSCULAR | Status: DC | PRN
  Administered 2012-01-29: 7000 [IU] via INTRAVENOUS
  Administered 2012-01-29: 8000 [IU] via INTRAVENOUS

## 2012-01-29 MED ORDER — LABETALOL HCL 5 MG/ML IV SOLN
INTRAVENOUS | Status: AC
  Filled 2012-01-29: qty 4

## 2012-01-29 MED ORDER — PROPOFOL 10 MG/ML IV BOLUS
INTRAVENOUS | Status: DC | PRN
  Administered 2012-01-29: 130 mg via INTRAVENOUS

## 2012-01-29 MED ORDER — PHENYLEPHRINE HCL 10 MG/ML IJ SOLN
INTRAMUSCULAR | Status: DC | PRN
  Administered 2012-01-29 (×6): 40 ug via INTRAVENOUS

## 2012-01-29 MED ORDER — HYDROMORPHONE 0.3 MG/ML IV SOLN
INTRAVENOUS | Status: AC
  Filled 2012-01-29: qty 25

## 2012-01-29 MED ORDER — CEFAZOLIN SODIUM-DEXTROSE 2-3 GM-% IV SOLR
INTRAVENOUS | Status: DC | PRN
  Administered 2012-01-29: 2 g via INTRAVENOUS

## 2012-01-29 MED ORDER — THROMBIN 20000 UNITS EX KIT
PACK | CUTANEOUS | Status: DC | PRN
  Administered 2012-01-29: 13:00:00 via TOPICAL

## 2012-01-29 SURGICAL SUPPLY — 65 items
ADH SKN CLS APL DERMABOND .7 (GAUZE/BANDAGES/DRESSINGS) ×3
BAG ISL DRAPE 18X18 STRL (DRAPES) ×1
BAG ISOLATION DRAPE 18X18 (DRAPES) IMPLANT
CANISTER SUCTION 2500CC (MISCELLANEOUS) ×2 IMPLANT
CANNULA VESSEL W/WING WO/VALVE (CANNULA) ×2 IMPLANT
CATH EMB 4FR 80CM (CATHETERS) ×1 IMPLANT
CLIP TI MEDIUM 24 (CLIP) ×2 IMPLANT
CLIP TI WIDE RED SMALL 24 (CLIP) ×2 IMPLANT
CLOTH BEACON ORANGE TIMEOUT ST (SAFETY) ×2 IMPLANT
COVER SURGICAL LIGHT HANDLE (MISCELLANEOUS) ×2 IMPLANT
DERMABOND ADVANCED (GAUZE/BANDAGES/DRESSINGS) ×3
DERMABOND ADVANCED .7 DNX12 (GAUZE/BANDAGES/DRESSINGS) ×3 IMPLANT
DRAIN SNY 10X20 3/4 PERF (WOUND CARE) IMPLANT
DRAPE INCISE IOBAN 66X45 STRL (DRAPES) ×2 IMPLANT
DRAPE ISOLATION BAG 18X18 (DRAPES) ×1
DRAPE PROXIMA HALF (DRAPES) ×1 IMPLANT
DRAPE WARM FLUID 44X44 (DRAPE) ×2 IMPLANT
DRSG COVADERM 4X10 (GAUZE/BANDAGES/DRESSINGS) IMPLANT
DRSG COVADERM 4X8 (GAUZE/BANDAGES/DRESSINGS) IMPLANT
ELECT REM PT RETURN 9FT ADLT (ELECTROSURGICAL) ×2
ELECTRODE REM PT RTRN 9FT ADLT (ELECTROSURGICAL) ×1 IMPLANT
EVACUATOR SILICONE 100CC (DRAIN) IMPLANT
GAUZE SPONGE 4X4 16PLY XRAY LF (GAUZE/BANDAGES/DRESSINGS) ×1 IMPLANT
GLOVE BIO SURGEON STRL SZ 6 (GLOVE) ×2 IMPLANT
GLOVE BIO SURGEON STRL SZ 6.5 (GLOVE) ×2 IMPLANT
GLOVE BIO SURGEON STRL SZ7.5 (GLOVE) ×3 IMPLANT
GLOVE BIOGEL PI IND STRL 6 (GLOVE) IMPLANT
GLOVE BIOGEL PI IND STRL 6.5 (GLOVE) IMPLANT
GLOVE BIOGEL PI IND STRL 7.0 (GLOVE) ×4 IMPLANT
GLOVE BIOGEL PI IND STRL 8 (GLOVE) IMPLANT
GLOVE BIOGEL PI INDICATOR 6 (GLOVE) ×1
GLOVE BIOGEL PI INDICATOR 6.5 (GLOVE) ×2
GLOVE BIOGEL PI INDICATOR 7.0 (GLOVE) ×4
GLOVE BIOGEL PI INDICATOR 8 (GLOVE) ×2
GLOVE ECLIPSE 6.5 STRL STRAW (GLOVE) ×1 IMPLANT
GOWN PREVENTION PLUS XLARGE (GOWN DISPOSABLE) ×4 IMPLANT
GOWN STRL NON-REIN LRG LVL3 (GOWN DISPOSABLE) ×6 IMPLANT
GRAFT PROPATEN W/RING 8X80X70 (Vascular Products) ×2 IMPLANT
KIT BASIN OR (CUSTOM PROCEDURE TRAY) ×2 IMPLANT
KIT ROOM TURNOVER OR (KITS) ×2 IMPLANT
LOOP VESSEL MAXI BLUE (MISCELLANEOUS) ×1 IMPLANT
LOOP VESSEL MINI RED (MISCELLANEOUS) ×2 IMPLANT
NS IRRIG 1000ML POUR BTL (IV SOLUTION) ×4 IMPLANT
PACK PERIPHERAL VASCULAR (CUSTOM PROCEDURE TRAY) ×2 IMPLANT
PAD ARMBOARD 7.5X6 YLW CONV (MISCELLANEOUS) ×4 IMPLANT
SLEEVE SURGEON STRL (DRAPES) ×1 IMPLANT
SPONGE SURGIFOAM ABS GEL 100 (HEMOSTASIS) ×4 IMPLANT
STAPLER VISISTAT 35W (STAPLE) ×1 IMPLANT
SUT PROLENE 5 0 C 1 24 (SUTURE) ×6 IMPLANT
SUT PROLENE 6 0 CC (SUTURE) ×4 IMPLANT
SUT SILK 2 0 (SUTURE) ×2
SUT SILK 2 0 FS (SUTURE) ×2 IMPLANT
SUT SILK 2 0 SH (SUTURE) ×4 IMPLANT
SUT SILK 2-0 18XBRD TIE 12 (SUTURE) ×1 IMPLANT
SUT VIC AB 2-0 CTX 36 (SUTURE) ×5 IMPLANT
SUT VIC AB 3-0 SH 27 (SUTURE) ×8
SUT VIC AB 3-0 SH 27X BRD (SUTURE) ×4 IMPLANT
SUT VICRYL 4-0 PS2 18IN ABS (SUTURE) ×6 IMPLANT
SYR 3ML LL SCALE MARK (SYRINGE) ×1 IMPLANT
SYR TB 1ML LUER SLIP (SYRINGE) ×1 IMPLANT
TAPE UMBILICAL COTTON 1/8X30 (MISCELLANEOUS) ×1 IMPLANT
TOWEL OR 17X24 6PK STRL BLUE (TOWEL DISPOSABLE) ×4 IMPLANT
TOWEL OR 17X26 10 PK STRL BLUE (TOWEL DISPOSABLE) ×4 IMPLANT
TRAY FOLEY CATH 14FRSI W/METER (CATHETERS) ×2 IMPLANT
WATER STERILE IRR 1000ML POUR (IV SOLUTION) ×2 IMPLANT

## 2012-01-29 NOTE — Interval H&P Note (Signed)
History and Physical Interval Note:  01/29/2012 10:54 AM  Donald [name]  has presented today for surgery, with the diagnosis of Peripheral Vascular disease  The various methods of treatment have been discussed with the patient and family. After consideration of risks, benefits and other options for treatment, the patient has consented to  Procedure(s) (LRB) with comments: BYPASS GRAFT AXILLA-BIFEMORAL (N/A) as a surgical intervention .  The patient's history has been reviewed, patient examined, no change in status, stable for surgery.  I have reviewed the patient's chart and labs.  Questions were answered to the patient's satisfaction.    Will do left ax bifem since he has a portacath on the right side.   Karene Bracken E

## 2012-01-29 NOTE — Anesthesia Postprocedure Evaluation (Signed)
  Anesthesia Post-op Note  Patient: Donald [name]  Procedure(s) Performed: Procedure(s) (LRB) with comments: BYPASS GRAFT AXILLA-BIFEMORAL (N/A) - Left axilla-bifemoral bypass using gore-tex graft.   Patient Location: PACU  Anesthesia Type: General  Level of Consciousness: awake and alert   Airway and Oxygen Therapy: Patient Spontanous Breathing and Patient connected to nasal cannula oxygen  Post-op Pain: mild  Post-op Assessment: Post-op Vital signs reviewed  Post-op Vital Signs: Reviewed  Complications: No apparent anesthesia complications

## 2012-01-29 NOTE — Progress Notes (Signed)
Report to R. Slade RN as primary caregiver. 

## 2012-01-29 NOTE — Anesthesia Procedure Notes (Signed)
Procedure Name: Intubation Date/Time: 01/29/2012 11:03 AM Performed by: Gayla Medicus Pre-anesthesia Checklist: Patient identified, Timeout performed, Emergency Drugs available, Suction available and Patient being monitored Patient Re-evaluated:Patient Re-evaluated prior to inductionOxygen Delivery Method: Circle system utilized Preoxygenation: Pre-oxygenation with 100% oxygen Intubation Type: IV induction Ventilation: Mask ventilation without difficulty and Oral airway inserted - appropriate to patient size Laryngoscope Size: Mac and 3 Grade View: Grade II Tube type: Oral Tube size: 7.5 mm Number of attempts: 1 Airway Equipment and Method: Stylet Placement Confirmation: ETT inserted through vocal cords under direct vision,  positive ETCO2 and breath sounds checked- equal and bilateral Secured at: 23 cm Tube secured with: Tape Dental Injury: Teeth and Oropharynx as per pre-operative assessment

## 2012-01-29 NOTE — Anesthesia Preprocedure Evaluation (Addendum)
Anesthesia Evaluation  Patient identified by MRN, date of birth, ID band Patient awake    Reviewed: Allergy & Precautions, H&P , NPO status , Patient's Chart, lab work & pertinent test results, reviewed documented beta blocker date and time   Airway Mallampati: II TM Distance: >3 FB Neck ROM: Full    Dental  (+) Dental Advisory Given and Poor Dentition   Pulmonary pneumonia -,  breath sounds clear to auscultation        Cardiovascular hypertension, Pt. on medications Rhythm:Regular Rate:Normal     Neuro/Psych CVA, Residual Symptoms    GI/Hepatic Neg liver ROS, GERD-  Medicated and Controlled,  Endo/Other  diabetes, Type 2, Oral Hypoglycemic Agents  Renal/GU negative Renal ROS     Musculoskeletal   Abdominal   Peds  Hematology negative hematology ROS (+)   Anesthesia Other Findings   Reproductive/Obstetrics                         Anesthesia Physical Anesthesia Plan  ASA: III  Anesthesia Plan: General   Post-op Pain Management:    Induction: Intravenous  Airway Management Planned: Oral ETT  Additional Equipment: Arterial line  Intra-op Plan:   Post-operative Plan: Extubation in OR  Informed Consent: I have reviewed the patients History and Physical, chart, labs and discussed the procedure including the risks, benefits and alternatives for the proposed anesthesia with the patient or authorized representative who has indicated his/her understanding and acceptance.   Dental advisory given  Plan Discussed with: CRNA, Anesthesiologist and Surgeon  Anesthesia Plan Comments:        Anesthesia Quick Evaluation

## 2012-01-29 NOTE — Preoperative (Signed)
Beta Blockers   Reason not to administer Beta Blockers:Not Applicable 

## 2012-01-29 NOTE — Op Note (Addendum)
Procedure: #1 Left axillary bifemoral bypass  Preoperative diagnosis: Rest pain right foot  Postoperative diagnosis: Same  Anesthesia: Gen.  Assistant: Della Goo PA-C, Doreatha Massed PA-C  Operative findings: #1  8 mm Propaten Left axillary femoral , 8 mm propaten femoral femoral  Procedure details: After obtaining informed consent, the patient was taken to the operating room. The patient was placed in supine position on the operating room table. After induction of general anesthesia, endotracheal intubation,and placement of a foley catheter  the patient was prepped and draped in usual sterile fashion from the chin all the way to the toes. The left arm was tucked. The right arm was left out.  Next a longitudinal incision was made in the right groin. Incision was carried into the subcutaneous tissues down to level of the common femoral artery.  This was thickened and had no pulse within it.  The profunda was small approximately 3 mm and thickened.  The SFA was chronically occluded.  It was dissected free and and a vessel loop placed around it.  Dissection was also carried up under the inguinal ligament to the external iliac artery and a vessel loop placed around it above the circumflex iliac branch.    Attention was then turned to the left groin. In similar fashion, the left groin was reopened through a longitudinal incisions and carried down through the subcutaneous tissues down to level of the native left common femoral artery. This had a faint pulse.  This was dissected free circumferentially. This was then traced up to level the femoral bifurcation. The profunda femoris artery was dissected free circumferentially. Dissection was then carried out on the common femoral artery posteriorly all the way up to the level of the inguinal ligament and the left external iliac artery was dissected free circumferentially similar to the right.  The common femoral artery on the left was thickened as well  but of better quality than the right.  The profunda was similarly thickened and small.  Next, a slightly oblique incision was made in the left infraclavicular space to dissect out the left axillary artery. The incision was carried through the subcutaneous tissues and down to the level of the fascia for the pectoralis muscle. The fascia was opened with cautery. The muscles were slightly divided and I tried to carry the dissection along the direction of the fibers. The axillary vein was identified. Several small side branches ligated and divided between silk ties. This was then dissected free circumferentially and pulled down to allow exposure of the axillary artery just behind it. The axillary artery was dissected free circumferentially. Several small side branches were also ligated and divided between silk ties. Several centimeters artery was dissected free to allow a reasonable surface for grafting.  Next a subcutaneous tunnel was created connecting the left groin incision and then underneath the pectoralis major and minor muscle down to the lett groin. An 8 mm Propaten graft was then brought through this subcutaneous tunnel. The patient was given 8,000 units of intravenous heparin. The patient was given an additional bolus of 7000 units of heparin during the course of the case. Please see anesthesia record for details. The axillary artery was clamped proximally and distally with Henley clamps. A longitudinal opening was made on the anteroinferior surface of the axillary artery and this arteriotomy was extended with Potts scissors. 8 mm and of the Propaten graft was slightly beveled and sewn end of graft to side of artery using a running 6-0 Prolene suture. At completion,  the anastomosis was forebled backbled and thoroughly flushed. The graft was clamped at its origin.  Attention was then turned back to the patient's left groin. The native external iliac was controlled with a small Cooley clamp.  The profunda  femoris and superficial femoral arteries were controlled with vessel loops. The right superficial femoral artery was chronically occluded. This was ligated and divided with silk ties. The pre-existing right femoropopliteal bypass graft was dissected free circumferentially and divided just after the proximal anastomosis. The new 8 mm axillary bypass graft was then brought down to the common femoral artery. This was cut to length. I opened the common femoral artery further down to the femoral bifurcation. I then laid the graft onto the common femoral artery with a long spatulated end. Just prior to completion of the anastomosis everything was forebled backbled and thoroughly flushed the anastomosis was secured clamps released there was good pulsatile flow in the profunda immediately. There was also good Doppler flow in the profunda.   Attention was then turned to the right groin.  An 8 mm ring supported Propaten graft was brought on the operative field and tunneled subcutaneously from the left groin to the right groin. The left common femoral artery was controlled proximally with a peripheral DeBakey clamp. The superficial femoral and profunda femoris arteries were controlled with Vesseloops. A longitudinal arteriotomy was made in the common femoral artery and there was fresh thrombus in the common femoral and profunda femoris.  This was removed under direct vision and a #4 Fogarty catheter passed down the profunda and up the external iliac artery until no further thrombus was obtained on 2 passes.  The was some pulsatile flow in the right external iliac at this point as well as good profunda backbleeding.  The anastomosis was spatulated out onto the profunda femoral artery several centimeters. The 8 mm  Propaten graft was beveled on the end and sewn end of graft to the right common femoral and profunda femoral artery using a running 6-0 Prolene suture. At completion the anastomosis was thoroughly backbled. This was  then clamped. The graft was then measured to length to anastomose to the left axillary femoral bypass. This was controlled proximally with a peripheral DeBakey clamp and distally with a Henley clamp. A longitudinal opening was made in the axillary femoral bypass approximately 2 cm above the takeoff of the left femoral anastomosis. The new femoral-femoral bypass graft was then sewn end of graft to side of the axillary femoral graft using a running 5-0 Prolene suture. Just prior to completion of the anastomosis, it was forebled backbled and thoroughly flushed. The anastomosis was secured, clamps released, and there was pulsatile flow right groin immediately. Patient had posterior tibial doppler signals bilaterally that augmented about 80% with unclamping the graft. Hemostasis was then obtained with the assistance of 100 mg of protamine as well as thrombin Gelfoam in  all the incisions. Both groins were then closed in multiple layers of running 2 0 and 3-0 Vicryl suture. Each groin incision was closed with a 4 0 Vicryl subcuticular stitch.  The axillary incision was closed with a running 2-0 Vicryl suture to reapproximate the muscle fascia. Skin was then closed with a 4 0 Vicryl subcuticular stitch. Dermabond was applied to all 3 incisions.  The patient tolerated the procedure well and there were complications. Instrument sponge and needle counts correct at the end of the case. The patient was taken to the recovery room in stable condition. Patient had audible posterior tibial  Doppler signal in the right foot and posterior tibial Doppler signal in left foot at the end of the case.  Fabienne Bruns, MD [facility] Office: [phone] Pager: [phone]

## 2012-01-29 NOTE — Progress Notes (Signed)
ANTICOAGULATION CONSULT NOTE - Follow Up Consult  Pharmacy Consult for Heparin Indication:  LE Ischemia (   Allergies  Allergen Reactions  . Other     Pt states allergic to "muscle relaxer" but unsure of name.    Patient Measurements: Height: 5\' 9"  (175.3 cm) Weight: 176 lb 2.4 oz (79.9 kg) IBW/kg (Calculated) : 70.7  Heparin dosing weight: 80 kg  Vital Signs: Temp: 98.8 F (37.1 C) (09/30 2258) Temp src: Oral (09/30 2258) BP: 126/47 mmHg (09/30 2258) Pulse Rate: 73  (09/30 2258)  Labs:  Basename 01/29/12 0020 01/28/12 0420 01/27/12 0430 01/26/12 0455  HGB 10.8* 8.3* -- --  HCT 33.1* 26.2* 27.7* --  PLT 251 297 306 --  APTT -- -- -- --  LABPROT -- -- -- --  INR -- -- -- --  HEPARINUNFRC 0.53 -- -- --  CREATININE 1.09 -- -- 0.98  CKTOTAL -- -- -- --  CKMB -- -- -- --  TROPONINI -- -- -- --    Estimated Creatinine Clearance: 64.9 ml/min (by C-G formula based on Cr of 1.09).   Medical History: Past Medical History  Diagnosis Date  . GERD (gastroesophageal reflux disease)   . Hyperlipidemia   . Hypertension   . Peripheral vascular disease   . Type II diabetes mellitus   . Pneumonia   . Internal and external bleeding hemorrhoids   . Stroke 2008    "left side weak; unable to move left hand still" (01/23/2012)  . History of gout     left great toe  . anal ca dx'd 08/2007    S/P chemo, radiation, biopsies  . Metastases to the liver dx'd 08/2010    Medications:  Prescriptions prior to admission  Medication Sig Dispense Refill  . acidophilus (RISAQUAD) CAPS Take by mouth daily.        . bisacodyl (DULCOLAX) 5 MG EC tablet Take 20 mg by mouth daily.       Marland Kitchen dipyridamole-aspirin (AGGRENOX) 25-200 MG per 12 hr capsule Take 1 capsule by mouth 2 (two) times daily.       Marland Kitchen gabapentin (NEURONTIN) 100 MG capsule Take 200 mg by mouth at bedtime.      Marland Kitchen lisinopril (PRINIVIL,ZESTRIL) 5 MG tablet Take 5 mg by mouth daily.        . metFORMIN (GLUMETZA) 500 MG (MOD) 24 hr  tablet Take 500 mg by mouth 2 (two) times daily with a meal.       . omega-3 acid ethyl esters (LOVAZA) 1 G capsule Take 2 g by mouth 2 (two) times daily.        Marland Kitchen omeprazole (PRILOSEC) 20 MG capsule Take 40 mg by mouth daily.        Scheduled:     . aspirin  81 mg Oral Daily  . atorvastatin  40 mg Oral q1800  . docusate sodium  100 mg Oral BID  . fentaNYL  50 mcg Transdermal Q72H  . furosemide  20 mg Intravenous Once  . insulin aspart  0-15 Units Subcutaneous TID WC  . insulin aspart  0-5 Units Subcutaneous QHS  . lisinopril  2.5 mg Oral Daily  . pantoprazole  40 mg Oral Q1200    Assessment: 66 yo male to resume IV heparin infusion for new occlusion right external iliac artery.  Patient is scheduled for a left axillary bifemoral bypass graft tomorrow by Dr. Darrick Penna. He is s/p cardiac cath on 01/25/12. He was receiving IV Heparin infusion 1550 units/hr prior to  cardiac cath. Heparin level is at-goal on 1550 units/hr.   Goal of Therapy:  Heparin level 0.3-0.7 units/ml Monitor platelets by anticoagulation protocol: Yes   Plan:  1. Continue IV heparin drip 1550 units/hr (no bolus s/p cardiac cath 9/27).  2. Heparin infusion to stop at 6am for left axillary bifemoral bypass graft.  Lorre Munroe, PharmD 01/29/2012,1:35 AM

## 2012-01-29 NOTE — Progress Notes (Signed)
RN called to PT room to check on PT for he was off of the telemetry monitor. While placing PT back on telemetry monitor it was brought to the RNs attention that the PT had pulled out his right chest port-a-cath. IV notified. Will continue to monitor. Steva Colder [facility]  01/29/2012 4:13 AM

## 2012-01-29 NOTE — Anesthesia Postprocedure Evaluation (Signed)
  Anesthesia Post-op Note  Patient: Donald [name]  Procedure(s) Performed: Procedure(s) (LRB) with comments: BYPASS GRAFT AXILLA-BIFEMORAL (N/A) - Left axilla-bifemoral bypass using gore-tex graft.   Patient Location: PACU  Anesthesia Type: General  Level of Consciousness: awake  Airway and Oxygen Therapy: Patient Spontanous Breathing  Post-op Pain: mild  Post-op Assessment: Post-op Vital signs reviewed  Post-op Vital Signs: Reviewed  Complications: No apparent anesthesia complications

## 2012-01-29 NOTE — Progress Notes (Signed)
Pt refused bed alarm. Pt stated "Just turn it off. I'm only trying to use the urinal, I'm not trying to get up". Educated Pt on importance of bed alarm and that the setting of the bed alarm could be changed instead of turning the bed alarm. Pt still insisted that bed alarm be turned off. Will continue to monitor pt and check frequently for pt safety. Steva Colder [facility]  3:42 AM 01/29/2012

## 2012-01-29 NOTE — H&P (View-Only) (Signed)
[facility]  Subjective  - POD #3 Date of Surgery: 01/23/2012 - 01/25/2012  Procedure(s):  ABDOMINAL AORTAGRAM  LEFT HEART CATHETERIZATION WITH CORONARY ANGIOGRAM  Surgeon: Surgeon(s):  Kathleene Hazel, MD  History of Present Illness  Donald [name] is a 66 y.o. male who has known Right ext iliac occlusive disease The patient's pre-op symptoms of pain are Unchanged . Patients pain is fairly well controlled.    Objective 114/63 84 98.2 F (36.8 C) (Oral) 18 92%  Intake/Output Summary (Last 24 hours) at 01/28/12 0805 Last data filed at 01/28/12 0032  Gross per 24 hour  Intake    240 ml  Output    225 ml  Net     15 ml   Pt is A&O x 3  right lower extremity:Limb is cool; with palor  No palpable or doppler signal achieved. Sensation intact and active range of motion intact of right lower extremity.  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 Darrick Penna.   Patient scheduled for right axillobifemoral bypass graft tomorrow. May also potentially require right femoropopliteal bypass graft. His hemoglobin is 8.2 and I think he should be transfused preoperatively.  Di Kindle. Edilia Bo, MD, FACS Bee[facility] [phone] 01/28/2012

## 2012-01-29 NOTE — Transfer of Care (Signed)
Immediate Anesthesia Transfer of Care Note  Patient: Donald [name]  Procedure(s) Performed: Procedure(s) (LRB) with comments: BYPASS GRAFT AXILLA-BIFEMORAL (N/A) - Left axilla-bifemoral bypass using gore-tex graft.   Patient Location: PACU  Anesthesia Type: General  Level of Consciousness: awake and alert   Airway & Oxygen Therapy: Patient Spontanous Breathing and Patient connected to nasal cannula oxygen  Post-op Assessment: Report given to PACU RN, Post -op Vital signs reviewed and stable and Patient moving all extremities  Post vital signs: Reviewed and stable  Complications: No apparent anesthesia complications

## 2012-01-30 DIAGNOSIS — R5381 Other malaise: Secondary | ICD-10-CM

## 2012-01-30 DIAGNOSIS — R269 Unspecified abnormalities of gait and mobility: Secondary | ICD-10-CM

## 2012-01-30 DIAGNOSIS — I739 Peripheral vascular disease, unspecified: Secondary | ICD-10-CM

## 2012-01-30 DIAGNOSIS — Z48812 Encounter for surgical aftercare following surgery on the circulatory system: Secondary | ICD-10-CM

## 2012-01-30 LAB — CBC
HCT: 25.4 % — ABNORMAL LOW (ref 39.0–52.0)
Hemoglobin: 8.2 g/dL — ABNORMAL LOW (ref 13.0–17.0)
MCV: 81.7 fL (ref 78.0–100.0)
RBC: 3.11 MIL/uL — ABNORMAL LOW (ref 4.22–5.81)
RDW: 15.2 % (ref 11.5–15.5)
WBC: 5.9 10*3/uL (ref 4.0–10.5)

## 2012-01-30 LAB — BASIC METABOLIC PANEL
CO2: 23 mEq/L (ref 19–32)
Chloride: 104 mEq/L (ref 96–112)
Creatinine, Ser: 0.88 mg/dL (ref 0.50–1.35)
GFR calc Af Amer: >90 mL/min (ref >90)
Potassium: 4.2 mEq/L (ref 3.5–5.1)
Sodium: 138 mEq/L (ref 135–145)

## 2012-01-30 LAB — GLUCOSE, CAPILLARY

## 2012-01-30 MED ORDER — ENOXAPARIN SODIUM 40 MG/0.4ML ~~LOC~~ SOLN
40.0000 mg | SUBCUTANEOUS | Status: DC
  Administered 2012-01-30: 40 mg via SUBCUTANEOUS
  Filled 2012-01-30 (×2): qty 0.4

## 2012-01-30 MED ORDER — BISACODYL 10 MG RE SUPP
10.0000 mg | Freq: Once | RECTAL | Status: AC
  Administered 2012-01-30: 10 mg via RECTAL
  Filled 2012-01-30: qty 1

## 2012-01-30 MED ORDER — FLEET ENEMA 7-19 GM/118ML RE ENEM: 1.0000 | ENEMA | Freq: Every day | RECTAL | Status: DC | PRN

## 2012-01-30 NOTE — Progress Notes (Signed)
VASCULAR LAB PRELIMINARY  ARTERIAL    ABI completed:    RIGHT    LEFT    PRESSURE WAVEFORM  PRESSURE WAVEFORM  BRACHIAL 110 Triphasic  BRACHIAL  Triphasic   DP 32 Dampened monophasic  DP 33 Monophasic   AT   AT    PT 31 Dampened monophasic  PT 41 Monophasic   PER   PER    GREAT TOE  NA GREAT TOE  NA    RIGHT LEFT  ABI 0.29 0.37    Duplex of left axillary-femoral bypass graft and left to right fem-fem bypass graft:  Grafts patent.  No evidence of stenosis at anastomoses.  Waveform in the fem-fem graft demonstrates the "bunny sign" throughout the entire graft.  Bilateral profunda arteries patent with monophasic flow.   Saanvi Hakala, RVT Gertie Fey, RDCS, RDMS 01/30/2012, 10:54 AM

## 2012-01-30 NOTE — Evaluation (Signed)
Physical Therapy Evaluation Patient Details Name: Donald [name] MRN: [id number] DOB: April 07, 1946 Today's Date: 01/30/2012 Time: 4098-1191 PT Time Calculation (min): 29 min  PT Assessment / Plan / Recommendation Clinical Impression  pt admitted with painful R LE, s/p axillo-bifemoral BPG.  Mobility limited by pain and weakness, residual L sided weakness(esp UE) from a stroke.  Will see pt on acute  Will likely need ST SNF for rehab.    PT Assessment  Patient needs continued PT services    Follow Up Recommendations  Post acute inpatient rehab    Barriers to Discharge Inaccessible home environment      Equipment Recommendations  Other (comment) (may need stair system to negotiate steps)    Recommendations for Other Services     Frequency Min 3X/week    Precautions / Restrictions Precautions Precautions: Fall   Pertinent Vitals/Pain 9/10 pain in R leg       Mobility  Bed Mobility Bed Mobility: Supine to Sit;Sitting - Scoot to Edge of Bed Supine to Sit: 3: Mod assist Sitting - Scoot to Edge of Bed: 3: Mod assist Transfers Transfers: Sit to Stand;Stand to Sit;Stand Pivot Transfers Sit to Stand: 3: Mod assist Stand to Sit: 3: Mod assist Stand Pivot Transfers: 3: Mod assist Details for Transfer Assistance: vc's for safest technique; stability assist from the R side. Ambulation/Gait Ambulation/Gait Assistance: 1: +2 Total assist Ambulation/Gait: Patient Percentage: 60% Ambulation Distance (Feet): 6 Feet Ambulation/Gait Assistance Details: stable, but moderate HP gait with L foot dragging behind in ER position Gait Pattern: Step-to pattern;Decreased step length - left;Decreased stance time - right;Decreased stride length Stairs: No Wheelchair Mobility Wheelchair Mobility: No    Shoulder Instructions     Exercises     PT Diagnosis: Difficulty walking;Generalized weakness;Acute pain;Hemiplegia non-dominant side  PT Problem List: Decreased strength;Decreased activity  tolerance;Decreased balance;Decreased mobility;Decreased knowledge of use of DME;Decreased knowledge of precautions;Pain PT Treatment Interventions:     PT Goals Acute Rehab PT Goals PT Goal Formulation: With patient/family Time For Goal Achievement: 02/13/12 Potential to Achieve Goals: Good Pt will go Supine/Side to Sit: with min assist PT Goal: Supine/Side to Sit - Progress: Goal set today Pt will go Sit to Stand: with min assist PT Goal: Sit to Stand - Progress: Goal set today Pt will go Stand to Sit: with min assist PT Goal: Stand to Sit - Progress: Goal set today Pt will Transfer Bed to Chair/Chair to Bed: with min assist PT Transfer Goal: Bed to Chair/Chair to Bed - Progress: Goal set today Pt will Ambulate: 51 - 150 feet;with min assist;with least restrictive assistive device PT Goal: Ambulate - Progress: Goal set today Pt will Go Up / Down Stairs: 3-5 stairs;with min assist;with rail(s);Other (comment) (to be addressed only if to go directly home) PT Goal: Up/Down Stairs - Progress: Goal set today  Visit Information  Last PT Received On: 01/30/12 Assistance Needed: +2 PT/OT Co-Evaluation/Treatment: Yes    Subjective Data  Patient Stated Goal: back home and able to get around better   Prior Functioning  Home Living Lives With: Spouse Available Help at Discharge: Family Type of Home: House Home Access: Stairs to enter Secretary/administrator of Steps: 10 Entrance Stairs-Rails: Right;Left Home Layout: Two level Alternate Level Stairs-Number of Steps: flight Alternate Level Stairs-Rails: Right;Left Bathroom Shower/Tub: Walk-in shower;Door [employer] Toilet: Standard Home Adaptive Equipment: Bedside commode/3-in-1;Walker - rolling Prior Function Level of Independence: Needs assistance Needs Assistance: Bathing;Dressing Able to Take Stairs?: Yes (but pt feels he will have to get  a stair lift now) Driving: Yes Vocation: Retired Musician: No  difficulties;Other (comment) (forced speech) Dominant Hand: Right    Cognition  Overall Cognitive Status: Impaired Area of Impairment: Safety/judgement;Attention Arousal/Alertness: Awake/alert Orientation Level: Appears intact for tasks assessed Behavior During Session: [facility] for tasks performed Current Attention Level: Alternating Safety/Judgement: Impulsive    Extremity/Trunk Assessment Right Lower Extremity Assessment RLE ROM/Strength/Tone: Within functional levels RLE Sensation: WFL - Light Touch Left Lower Extremity Assessment LLE ROM/Strength/Tone: WFL for tasks assessed;Deficits LLE ROM/Strength/Tone Deficits: grossly >3+/5 and decr coordination   Balance Balance Balance Assessed: Yes Static Sitting Balance Static Sitting - Balance Support: Right upper extremity supported;No upper extremity supported;Feet supported Static Sitting - Level of Assistance: 5: Stand by assistance Static Sitting - Comment/# of Minutes: 4, handled minor balance challenges at EOB Static Standing Balance Static Standing - Balance Support: Right upper extremity supported;During functional activity Static Standing - Level of Assistance: 3: Mod assist  End of Session PT - End of Session Equipment Utilized During Treatment: Gait belt Activity Tolerance: Patient tolerated treatment well;Patient limited by pain Patient left: in chair;with call bell/phone within reach;with family/visitor present Nurse Communication: Mobility status  GP     Donald [name], Donald [name] 01/30/2012, 12:19 PM  01/30/2012  Ravenna Bing, PT [phone] (pager)

## 2012-01-30 NOTE — Progress Notes (Addendum)
[facility] [place]  Progress Note Bypass Surgery  Date of Surgery: 01/23/2012 - 01/29/2012  Procedure(s): Left BYPASS GRAFT AXILLA-BIFEMORAL Surgeon: Surgeon(s): Sherren Kerns, MD  1 Day Post-Op  History of Present Illness  Donald [name] is a 66 y.o. male who is S/P Procedure(s): BYPASS GRAFT AXILLA-BIFEMORAL left.  The patient's pre-op symptoms of pain are Improved . Patients pain is well controlled with PCA. C/O bilat calf achiness    VASC. LAB Studies:        ABI: Pend  Significant Diagnostic Studies: CBC Lab Results  Component Value Date   WBC 5.9 01/30/2012   HGB 8.2* 01/30/2012   HCT 25.4* 01/30/2012   MCV 81.7 01/30/2012   PLT 214 01/30/2012    BMET     Component Value Date/Time   NA 138 01/30/2012 0420   NA 147* 10/12/2011 0959   K 4.2 01/30/2012 0420   K 5.2* 10/12/2011 0959   CL 104 01/30/2012 0420   CL 102 10/12/2011 0959   CO2 23 01/30/2012 0420   CO2 29 10/12/2011 0959   GLUCOSE 172* 01/30/2012 0420   GLUCOSE 244* 10/12/2011 0959   BUN 6 01/30/2012 0420   BUN 13 10/12/2011 0959   CREATININE 0.88 01/30/2012 0420   CREATININE 1.0 10/12/2011 0959   CALCIUM 8.0* 01/30/2012 0420   CALCIUM 9.0 10/12/2011 0959   GFRNONAA 86* 01/30/2012 0420   GFRAA >90 01/30/2012 0420    COAG Lab Results  Component Value Date   INR 1.05 01/24/2012   INR 0.98 01/23/2012   INR 0.95 02/07/2011   No results found for this basename: PTT    Physical Examination  BP Readings from Last 3 Encounters:  01/30/12 114/84  01/30/12 114/84  01/30/12 114/84   Temp Readings from Last 3 Encounters:  01/30/12 98.4 F (36.9 C)   01/30/12 98.4 F (36.9 C)   01/30/12 98.4 F (36.9 C)    SpO2 Readings from Last 3 Encounters:  01/30/12 94%  01/30/12 94%  01/30/12 94%   Pulse Readings from Last 3 Encounters:  01/30/12 91  01/30/12 91  01/30/12 91   Pt is A&O x 3 Bilat Lower extremity and left chest wound: Incision/s is/are clean,dry.intact, and   healing without hematoma, erythema or drainage Right Limb is warm; with good color Left foot cool Both LE have good sensation and motion  Right Dorsalis Pedis pulse is weak/absent Right Posterior tibial pulse is  monophasic by Doppler Right peroneal pulse monophasic by doppler  Left Dorsalis Pedis pulse is absent LeftPosterior tibial pulse is  weak and monophasic by Doppler   Assessment/Plan: Pt. Doing well Post-op acute blood loss anemia asymptomatic and stable Post-op pain is controlled with PCA Wounds are healing well PT/OT for ambulation LLE cool will watch Calf soreness may be sec to reperfusion  ROCZNIAK,REGINA J [phone] 01/30/2012 7:52 AM   Incisions without hematoma Complains of bilateral calf pain, compartments soft bilat. Right foot drop which pt family states was present prior to operation Brisk PT doppler bilat R >L Difficult to hear doppler in graft possibly secondary to edema or residual air in graft wall Will get duplex this morning to check graft patency and ABI to check current perfusion  Will need PT/OT possible rehab  Fabienne Bruns, MD [facility] [place] Office: [phone] Pager: [phone]

## 2012-01-30 NOTE — Consult Note (Signed)
Physical Medicine and Rehabilitation Consult Reason for Consult: RLE pain Referring Physician:  Dr. Darrick Penna   HPI: Donald [name] is a 66 y.o. male with CVA with left sided weakness, CAD, DM, PAD, tobacco abuse-2PPD smoker, admitted via ED on 01/23/12 with right foot pain due to worsening of occlusive disease. LE CTA revealed significant PVD. Pre-op cardiac cath by Dr Clifton James total occlusion of R-coronary artery with good L-R collaterals and was cleared for procedure.  On 01/29/12, patient underwent L-axillary bifemoral BG by Dr. Darrick Penna.  PT evaluation done today revealing difficulty walking with acute pain.  Inpatient rehab recommended for progression.   Review of Systems  Eyes: Negative for blurred vision and double vision.  Respiratory: Negative for cough and shortness of breath.   Cardiovascular: Negative for chest pain.  Gastrointestinal: Positive for diarrhea. Negative for heartburn.  Neurological: Positive for focal weakness (LUE>LLE with  "occasionl" foot drop). Negative for headaches.   Past Medical History  Diagnosis Date  . GERD (gastroesophageal reflux disease)   . Hyperlipidemia   . Hypertension   . Peripheral vascular disease   . Type II diabetes mellitus   . Pneumonia   . Internal and external bleeding hemorrhoids   . Stroke 2008    "left side weak; unable to move left hand still" (01/23/2012)  . History of gout     left great toe  . anal ca dx'd 08/2007    S/P chemo, radiation, biopsies  . Metastases to the liver dx'd 08/2010   Past Surgical History  Procedure Date  . Carotid endarterectomy 2008    right  . Liver cryoablation 08/2010    "chemo shrunk the cancer 50% then they went in and burned the rest of it"  . Mass biopsy     rectal; "found cancer"   History reviewed. No pertinent family history.  Social History: Married. Independent with cane PTA. Ambulated household distances. Needed assist with ADLs. He reports that he has been smoking Cigarettes.  He  has a 100 pack-year smoking history. He has never used smokeless tobacco. He reports that he uses illicit drugs (Marijuana). He reports that he does not drink alcohol.   Allergies  Allergen Reactions  . Other     Pt states allergic to "muscle relaxer" but unsure of name.   Medications Prior to Admission  Medication Sig Dispense Refill  . acidophilus (RISAQUAD) CAPS Take by mouth daily.        . bisacodyl (DULCOLAX) 5 MG EC tablet Take 20 mg by mouth daily.       Marland Kitchen dipyridamole-aspirin (AGGRENOX) 25-200 MG per 12 hr capsule Take 1 capsule by mouth 2 (two) times daily.       Marland Kitchen gabapentin (NEURONTIN) 100 MG capsule Take 200 mg by mouth at bedtime.      Marland Kitchen lisinopril (PRINIVIL,ZESTRIL) 5 MG tablet Take 5 mg by mouth daily.        . metFORMIN (GLUMETZA) 500 MG (MOD) 24 hr tablet Take 500 mg by mouth 2 (two) times daily with a meal.       . omega-3 acid ethyl esters (LOVAZA) 1 G capsule Take 2 g by mouth 2 (two) times daily.        Marland Kitchen omeprazole (PRILOSEC) 20 MG capsule Take 40 mg by mouth daily.         Home: Home Living Lives With: Spouse Available Help at Discharge: Family Type of Home: House Home Access: Stairs to enter [employer] of Steps: 10 Entrance Stairs-Rails: Right;Left  Home Layout: Two level Alternate Level Stairs-Number of Steps: flight Alternate Level Stairs-Rails: Right;Left Bathroom Shower/Tub: Walk-in shower;Door [employer] Toilet: Standard Home Adaptive Equipment: Bedside commode/3-in-1;Walker - rolling  Functional History: Prior Function Able to Take Stairs?: Yes (but pt feels he will have to get a stair lift now) Driving: Yes Vocation: Retired Functional Status:  Mobility: Bed Mobility Bed Mobility: Supine to Sit;Sitting - Scoot to Edge of Bed Supine to Sit: 3: Mod assist Sitting - Scoot to Edge of Bed: 3: Mod assist Transfers Transfers: Sit to Stand;Stand to Sit;Stand Pivot Transfers Sit to Stand: 3: Mod assist Stand to Sit: 3: Mod assist Stand  Pivot Transfers: 3: Mod assist Ambulation/Gait Ambulation/Gait Assistance: 1: +2 Total assist Ambulation/Gait: Patient Percentage: 60% Ambulation Distance (Feet): 6 Feet Ambulation/Gait Assistance Details: stable, but moderate HP gait with L foot dragging behind in ER position Gait Pattern: Step-to pattern;Decreased step length - left;Decreased stance time - right;Decreased stride length Stairs: No Wheelchair Mobility Wheelchair Mobility: No  ADL:    Cognition: Cognition Arousal/Alertness: Awake/alert Orientation Level: Oriented X4 Cognition Overall Cognitive Status: Impaired Area of Impairment: Safety/judgement;Attention Arousal/Alertness: Awake/alert Orientation Level: Appears intact for tasks assessed Behavior During Session: [facility] for tasks performed Current Attention Level: Alternating Safety/Judgement: Impulsive  Blood pressure 147/54, pulse 82, temperature 98.5 F (36.9 C), temperature source Oral, resp. rate 12, height 5\' 9"  (1.753 m), weight 79.9 kg (176 lb 2.4 oz), SpO2 95.00%. Physical Exam  Nursing note and vitals reviewed. Constitutional: He is oriented to person, place, and time. He appears well-developed and well-nourished.  HENT:  Head: Normocephalic and atraumatic.  Eyes: Pupils are equal, round, and reactive to light.  Neck: Normal range of motion.  Cardiovascular: Normal rate and regular rhythm.   Pulmonary/Chest: Effort normal and breath sounds normal.  Abdominal: Bowel sounds are normal. He exhibits distension.  Musculoskeletal: He exhibits edema (RLE) and tenderness (right calf ).  Neurological: He is alert and oriented to person, place, and time.  Reflex Scores:      Tricep reflexes are 3+ on the left side.      Bicep reflexes are 3+ on the left side.      Brachioradialis reflexes are 3+ on the left side.      Patellar reflexes are 3+ on the left side.      Achilles reflexes are 3+ on the left side.      Decreased posture . Oriented X 3. LUE  weakness greater than lower. Resting tone in the left arm at 2-3/4 with contracture at the elbow and fingers, perhaps shoulder as well. CN notable for some weakness in the left face with slurred speech. His mood was a bit labile at times, but overall he was pleasant.   Skin:       Coolness left hand. Incisions intact    Results for orders placed during the hospital encounter of 01/23/12 (from the past 24 hour(s))  GLUCOSE, CAPILLARY     Status: Abnormal   Collection Time   01/29/12  4:27 PM      Component Value Range   Glucose-Capillary 173 (*) 70 - 99 mg/dL   Comment 1 Notify RN    GLUCOSE, CAPILLARY     Status: Abnormal   Collection Time   01/29/12  9:36 PM      Component Value Range   Glucose-Capillary 179 (*) 70 - 99 mg/dL   Comment 1 Notify RN     Comment 2 Documented in Chart    BASIC METABOLIC PANEL  Status: Abnormal   Collection Time   01/30/12  4:20 AM      Component Value Range   Sodium 138  135 - 145 mEq/L   Potassium 4.2  3.5 - 5.1 mEq/L   Chloride 104  96 - 112 mEq/L   CO2 23  19 - 32 mEq/L   Glucose, Bld 172 (*) 70 - 99 mg/dL   BUN 6  6 - 23 mg/dL   Creatinine, Ser 4.09  0.50 - 1.35 mg/dL   Calcium 8.0 (*) 8.4 - 10.5 mg/dL   GFR calc non Af Amer 86 (*) >90 mL/min   GFR calc Af Amer >90  >90 mL/min  CBC     Status: Abnormal   Collection Time   01/30/12  4:20 AM      Component Value Range   WBC 5.9  4.0 - 10.5 K/uL   RBC 3.11 (*) 4.22 - 5.81 MIL/uL   Hemoglobin 8.2 (*) 13.0 - 17.0 g/dL   HCT 81.1 (*) 91.4 - 78.2 %   MCV 81.7  78.0 - 100.0 fL   MCH 26.4  26.0 - 34.0 pg   MCHC 32.3  30.0 - 36.0 g/dL   RDW 95.6  21.3 - 08.6 %   Platelets 214  150 - 400 K/uL  GLUCOSE, CAPILLARY     Status: Abnormal   Collection Time   01/30/12  8:08 AM      Component Value Range   Glucose-Capillary 134 (*) 70 - 99 mg/dL   Comment 1 Documented in Chart     Comment 2 Notify RN     No results found.  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 Does the need for close, 24 hr/day medical supervision in concert with the patient's rehab needs make it unreasonable for this patient to be served in a less intensive setting? Yes 2. Co-Morbidities requiring supervision/potential complications: dm, hx of cva, see above 3. Due to bladder management, bowel management, safety, skin/wound care, disease management, medication administration, pain management and patient education, does the patient require 24 hr/day rehab nursing? Yes 4. Does the patient require coordinated care of a physician, rehab nurse, PT (1-2 hrs/day, 5 days/week) and OT (1-2 hrs/day, 5 days/week) to address physical and functional deficits in the context of the above medical diagnosis(es)? Yes Addressing deficits in the following areas: balance, endurance, locomotion, strength, transferring, bowel/bladder control, bathing, dressing, feeding, grooming, toileting and psychosocial support 5. Can the patient actively participate in an intensive therapy program of at least 3 hrs of therapy per day at least 5 days per week? Yes 6. The potential for patient to make measurable gains while on inpatient rehab is excellent 7. Anticipated functional outcomes upon discharge from inpatient rehab are supervision to min assist with PT, supervision to mod assist with OT, n/a with SLP. 8. Estimated rehab length of stay to reach the above functional goals is: 1-2 weeks 9. Does the patient have adequate social supports to accommodate these discharge functional goals? Yes 10. Anticipated D/C setting: Home 11. Anticipated post D/C treatments: HH therapy 12. Overall Rehab/Functional Prognosis: excellent  RECOMMENDATIONS: This patient's condition is appropriate for continued rehabilitative care in the following setting: CIR Patient has agreed to participate in recommended program. Yes Note that insurance prior authorization may be required for  reimbursement for recommended care.  Comment:Rehab RN to follow up.   Ivory Broad, MD     01/30/2012

## 2012-01-30 NOTE — Evaluation (Addendum)
Occupational Therapy Evaluation Patient Details Name: Donald [name] MRN: [id number] DOB: [birth year] Today's Date: 01/30/2012 Time: 7829-5621 OT Time Calculation (min): 31 min  OT Assessment / Plan / Recommendation Clinical Impression  Pt admitted with painful R LE, s/p axillo-bifemoral BPG.  Mobility and I with ADLs limited by pain and weakness, residual L sided weakness(esp UE) from a stroke. Pt will benefit from skilled OT in the acute setting followed by rehab to maximize I with ADL and ADL mobility prior to d/c.    OT Assessment  Patient needs continued OT Services    Follow Up Recommendations  Inpatient Rehab    Barriers to Discharge      Equipment Recommendations  Other (comment) (may need stair system to negotiate steps)    Recommendations for Other Services Rehab consult  Frequency  Min 2X/week    Precautions / Restrictions Precautions Precautions: Fall Precaution Comments: pt with "burn" near sacral area of back from recent radiation as well as redness on scrotal area which causes pain. cushion in room and pt educated on pressure relief Restrictions Weight Bearing Restrictions: No   Pertinent Vitals/Pain Pt reports sacral pain, back pain and scrotal pain but did not rate. RN aware. Pt placed on cushion for pressure relief.    ADL  Eating/Feeding: Simulated;Minimal assistance Where Assessed - Eating/Feeding: Chair Grooming: Simulated;Moderate assistance Where Assessed - Grooming: Supported standing Upper Body Dressing: Simulated;Moderate assistance Where Assessed - Upper Body Dressing: Supported sitting Lower Body Dressing: Performed;+1 Total assistance Where Assessed - Lower Body Dressing: Supported sit to Pharmacist, hospital: Simulated;Moderate assistance Toilet Transfer Method: Sit to Barista:  (from bed/chair) Toileting - Clothing Manipulation and Hygiene: Simulated;+1 Total assistance Where Assessed - Glass blower/designer  Manipulation and Hygiene: Standing Equipment Used: Gait belt Transfers/Ambulation Related to ADLs: +2total A(pt=60%) with ambulation throughout room ADL Comments: fatigues very easily    OT Diagnosis: Generalized weakness;Acute pain  OT Problem List: Decreased strength;Decreased range of motion;Decreased activity tolerance;Impaired balance (sitting and/or standing);Decreased knowledge of use of DME or AE;Decreased knowledge of precautions;Cardiopulmonary status limiting activity;Impaired UE functional use;Pain;Increased edema;Impaired sensation OT Treatment Interventions: Self-care/ADL training;Energy conservation;DME and/or AE instruction;Therapeutic activities;Patient/family education;Balance training   OT Goals Acute Rehab OT Goals OT Goal Formulation: With patient Time For Goal Achievement: 02/13/12 Potential to Achieve Goals: Good ADL Goals Pt Will Perform Grooming: with supervision;with set-up;Standing at sink;Sitting at sink ADL Goal: Grooming - Progress: Goal set today Pt Will Perform Upper Body Bathing: with set-up;with supervision;Sitting, chair;Sitting at sink;Sitting, edge of bed ADL Goal: Upper Body Bathing - Progress: Goal set today Pt Will Perform Upper Body Dressing: with modified independence;Sitting, bed;Sitting, chair ADL Goal: Upper Body Dressing - Progress: Goal set today Pt Will Transfer to Toilet: with supervision;Ambulation;with DME ADL Goal: Toilet Transfer - Progress: Goal set today Pt Will Perform Toileting - Clothing Manipulation: with supervision;Standing ADL Goal: Toileting - Clothing Manipulation - Progress: Goal set today  Visit Information  Last OT Received On: 01/30/12 Assistance Needed: +2 PT/OT Co-Evaluation/Treatment: Yes    Subjective Data  Subjective: My wife's been having to help me a lot lately Patient Stated Goal: Return home with wife   Prior Functioning     Home Living Lives With: Spouse Available Help at Discharge: Family Type of  Home: House Home Access: Stairs to enter Secretary/administrator of Steps: 10 Entrance Stairs-Rails: Right;Left Home Layout: Two level Alternate Level Stairs-Number of Steps: flight Alternate Level Stairs-Rails: Right;Left Bathroom Shower/Tub: Walk-in shower;Door [employer] Toilet: Standard Home Adaptive Equipment: Dan Humphreys -  rolling (toilet riser) Prior Function Level of Independence: Needs assistance Needs Assistance: Bathing;Dressing Able to Take Stairs?: Yes (but pt feels he will have to get a stair lift now) Driving: Yes Vocation: Retired Musician: No difficulties;Other (comment) (forced speech) Dominant Hand: Right         Vision/Perception     Cognition  Overall Cognitive Status: Impaired Area of Impairment: Safety/judgement;Attention Arousal/Alertness: Awake/alert Orientation Level: Appears intact for tasks assessed Behavior During Session: [facility] for tasks performed Current Attention Level: Alternating Safety/Judgement: Impulsive    Extremity/Trunk Assessment Right Upper Extremity Assessment RUE ROM/Strength/Tone: Deficits RUE ROM/Strength/Tone Deficits: grossly 4 to 4+/5 RUE Coordination: WFL - gross/fine motor Left Upper Extremity Assessment LUE ROM/Strength/Tone: Deficits LUE ROM/Strength/Tone Deficits: contractures shoulder and distally from stroke ~5years ago. ~15degrees shoulder ROM available. Right Lower Extremity Assessment RLE ROM/Strength/Tone: Within functional levels RLE Sensation: WFL - Light Touch Left Lower Extremity Assessment LLE ROM/Strength/Tone: WFL for tasks assessed;Deficits LLE ROM/Strength/Tone Deficits: grossly >3+/5 and decr coordination     Mobility Bed Mobility Bed Mobility: Supine to Sit;Sitting - Scoot to Edge of Bed Supine to Sit: 3: Mod assist Sitting - Scoot to Edge of Bed: 3: Mod assist Transfers Sit to Stand: 3: Mod assist Stand to Sit: 3: Mod assist Details for Transfer Assistance: vc's for safest  technique; stability assist from the R side.     Shoulder Instructions     Exercise     Balance Balance Balance Assessed: Yes Static Sitting Balance Static Sitting - Balance Support: Right upper extremity supported;No upper extremity supported;Feet supported Static Sitting - Level of Assistance: 5: Stand by assistance Static Standing Balance Static Standing - Balance Support: Right upper extremity supported;During functional activity Static Standing - Level of Assistance: 3: Mod assist   End of Session OT - End of Session Equipment Utilized During Treatment: Gait belt Activity Tolerance: Patient limited by fatigue Patient left: in chair;with call bell/phone within reach;with family/visitor present Nurse Communication: Mobility status  GO     Breah Joa 01/30/2012, 5:50 PM

## 2012-01-30 NOTE — Progress Notes (Signed)
Utilization review completed.  

## 2012-01-30 NOTE — Progress Notes (Signed)
Clinical Social Work  CSW reviewed chart which stated PT recommended CIR. CSW attempted to meet with patient in order to discuss SNF as alternative option. Patient became tearful and reported that he had a stressful day. Patient reports that he desires for CSW to return tomorrow because it has been too emotional today. CSW agreed and will follow up on 10.3.13.  [place], [place] [phone]

## 2012-01-31 ENCOUNTER — Inpatient Hospital Stay (HOSPITAL_COMMUNITY)
Admission: RE | Admit: 2012-01-31 | Discharge: 2012-02-07 | DRG: 945 | Disposition: A | Discharge status: Home Health | Payer: Medicare Other | Source: Ambulatory Visit | Attending: Physical Medicine & Rehabilitation | Admitting: Physical Medicine & Rehabilitation

## 2012-01-31 ENCOUNTER — Encounter (HOSPITAL_COMMUNITY): Payer: Self-pay | Admitting: Vascular Surgery

## 2012-01-31 DIAGNOSIS — Z5189 Encounter for other specified aftercare: Secondary | ICD-10-CM

## 2012-01-31 DIAGNOSIS — C210 Malignant neoplasm of anus, unspecified: Secondary | ICD-10-CM

## 2012-01-31 DIAGNOSIS — I10 Essential (primary) hypertension: Secondary | ICD-10-CM | POA: Diagnosis present

## 2012-01-31 DIAGNOSIS — I70229 Atherosclerosis of native arteries of extremities with rest pain, unspecified extremity: Secondary | ICD-10-CM | POA: Diagnosis present

## 2012-01-31 DIAGNOSIS — I2510 Atherosclerotic heart disease of native coronary artery without angina pectoris: Secondary | ICD-10-CM | POA: Diagnosis present

## 2012-01-31 DIAGNOSIS — I69959 Hemiplegia and hemiparesis following unspecified cerebrovascular disease affecting unspecified side: Secondary | ICD-10-CM

## 2012-01-31 DIAGNOSIS — K5900 Constipation, unspecified: Secondary | ICD-10-CM | POA: Diagnosis present

## 2012-01-31 DIAGNOSIS — F17200 Nicotine dependence, unspecified, uncomplicated: Secondary | ICD-10-CM | POA: Diagnosis present

## 2012-01-31 DIAGNOSIS — I739 Peripheral vascular disease, unspecified: Secondary | ICD-10-CM | POA: Diagnosis present

## 2012-01-31 DIAGNOSIS — K219 Gastro-esophageal reflux disease without esophagitis: Secondary | ICD-10-CM | POA: Diagnosis present

## 2012-01-31 DIAGNOSIS — E785 Hyperlipidemia, unspecified: Secondary | ICD-10-CM | POA: Diagnosis present

## 2012-01-31 DIAGNOSIS — R5381 Other malaise: Secondary | ICD-10-CM

## 2012-01-31 DIAGNOSIS — E119 Type 2 diabetes mellitus without complications: Secondary | ICD-10-CM | POA: Diagnosis present

## 2012-01-31 DIAGNOSIS — Z85048 Personal history of other malignant neoplasm of rectum, rectosigmoid junction, and anus: Secondary | ICD-10-CM

## 2012-01-31 LAB — GLUCOSE, CAPILLARY: Glucose-Capillary: 145 mg/dL — ABNORMAL HIGH (ref 70–99)

## 2012-01-31 LAB — URINE MICROSCOPIC-ADD ON

## 2012-01-31 LAB — URINALYSIS, ROUTINE W REFLEX MICROSCOPIC
Bilirubin Urine: NEGATIVE
Ketones, ur: 15 mg/dL — AB
Leukocytes, UA: NEGATIVE
Nitrite: NEGATIVE
Urobilinogen, UA: 1 mg/dL (ref 0.0–1.0)
pH: 6 (ref 5.0–8.0)

## 2012-01-31 MED ORDER — GUAIFENESIN-DM 100-10 MG/5ML PO SYRP: 15.0000 mL | ORAL_SOLUTION | ORAL | Status: DC | PRN

## 2012-01-31 MED ORDER — INSULIN ASPART 100 UNIT/ML ~~LOC~~ SOLN: 0.0000 [IU] | Freq: Every day | SUBCUTANEOUS | Status: DC

## 2012-01-31 MED ORDER — POLYETHYLENE GLYCOL 3350 17 G PO PACK
17.0000 g | PACK | Freq: Every day | ORAL | Status: DC
  Administered 2012-02-02 – 2012-02-04 (×3): 17 g via ORAL
  Filled 2012-01-31 (×6): qty 1

## 2012-01-31 MED ORDER — ALUM & MAG HYDROXIDE-SIMETH 200-200-20 MG/5ML PO SUSP: 30.0000 mL | ORAL | Status: DC | PRN

## 2012-01-31 MED ORDER — METFORMIN HCL 500 MG PO TABS
500.0000 mg | ORAL_TABLET | Freq: Two times a day (BID) | ORAL | Status: DC
  Administered 2012-02-01 – 2012-02-07 (×13): 500 mg via ORAL
  Filled 2012-01-31 (×15): qty 1

## 2012-01-31 MED ORDER — LISINOPRIL 2.5 MG PO TABS
2.5000 mg | ORAL_TABLET | Freq: Every day | ORAL | Status: DC
  Administered 2012-02-01 – 2012-02-07 (×7): 2.5 mg via ORAL
  Filled 2012-01-31 (×8): qty 1

## 2012-01-31 MED ORDER — OXYCODONE HCL 5 MG PO TABS
5.0000 mg | ORAL_TABLET | ORAL | Status: DC | PRN
  Administered 2012-01-31 – 2012-02-07 (×21): 10 mg via ORAL
  Filled 2012-01-31 (×21): qty 2

## 2012-01-31 MED ORDER — ACETAMINOPHEN 325 MG PO TABS
325.0000 mg | ORAL_TABLET | ORAL | Status: DC | PRN
  Administered 2012-02-01: 650 mg via ORAL
  Filled 2012-01-31: qty 2

## 2012-01-31 MED ORDER — PROCHLORPERAZINE 25 MG RE SUPP
12.5000 mg | Freq: Four times a day (QID) | RECTAL | Status: DC | PRN
  Filled 2012-01-31: qty 1

## 2012-01-31 MED ORDER — TRAMADOL HCL 50 MG PO TABS
50.0000 mg | ORAL_TABLET | Freq: Four times a day (QID) | ORAL | Status: DC | PRN
  Administered 2012-02-01: 50 mg via ORAL
  Filled 2012-01-31: qty 1

## 2012-01-31 MED ORDER — PHENOL 1.4 % MT LIQD
1.0000 | OROMUCOSAL | Status: DC | PRN
  Filled 2012-01-31: qty 177

## 2012-01-31 MED ORDER — GABAPENTIN 100 MG PO CAPS
200.0000 mg | ORAL_CAPSULE | Freq: Every day | ORAL | Status: DC
  Administered 2012-01-31 – 2012-02-04 (×5): 200 mg via ORAL
  Filled 2012-01-31 (×6): qty 2

## 2012-01-31 MED ORDER — PROCHLORPERAZINE MALEATE 5 MG PO TABS
5.0000 mg | ORAL_TABLET | Freq: Four times a day (QID) | ORAL | Status: DC | PRN
  Filled 2012-01-31: qty 2

## 2012-01-31 MED ORDER — RISAQUAD PO CAPS
2.0000 | ORAL_CAPSULE | Freq: Every day | ORAL | Status: DC
  Administered 2012-02-01 – 2012-02-07 (×7): 2 via ORAL
  Filled 2012-01-31 (×10): qty 2

## 2012-01-31 MED ORDER — ATORVASTATIN CALCIUM 40 MG PO TABS
40.0000 mg | ORAL_TABLET | Freq: Every day | ORAL | Status: DC
  Administered 2012-01-31: 40 mg via ORAL
  Filled 2012-01-31 (×2): qty 1

## 2012-01-31 MED ORDER — SODIUM CHLORIDE 0.9 % IJ SOLN: 9.0000 mL | INTRAMUSCULAR | Status: DC | PRN

## 2012-01-31 MED ORDER — GUAIFENESIN-DM 100-10 MG/5ML PO SYRP: 5.0000 mL | ORAL_SOLUTION | Freq: Four times a day (QID) | ORAL | Status: DC | PRN

## 2012-01-31 MED ORDER — PANTOPRAZOLE SODIUM 40 MG PO TBEC
40.0000 mg | DELAYED_RELEASE_TABLET | Freq: Every day | ORAL | Status: DC
  Administered 2012-02-01 – 2012-02-06 (×6): 40 mg via ORAL
  Filled 2012-01-31 (×6): qty 1

## 2012-01-31 MED ORDER — METOPROLOL TARTRATE 1 MG/ML IV SOLN
2.0000 mg | INTRAVENOUS | Status: DC | PRN
  Filled 2012-01-31: qty 5

## 2012-01-31 MED ORDER — NALOXONE HCL 0.4 MG/ML IJ SOLN: 0.4000 mg | INTRAMUSCULAR | Status: DC | PRN

## 2012-01-31 MED ORDER — DOCUSATE SODIUM 100 MG PO CAPS
100.0000 mg | ORAL_CAPSULE | Freq: Two times a day (BID) | ORAL | Status: DC
  Administered 2012-01-31 – 2012-02-04 (×9): 100 mg via ORAL
  Filled 2012-01-31 (×12): qty 1

## 2012-01-31 MED ORDER — FLEET ENEMA 7-19 GM/118ML RE ENEM: 1.0000 | ENEMA | Freq: Once | RECTAL | Status: AC | PRN

## 2012-01-31 MED ORDER — OXYCODONE-ACETAMINOPHEN 5-325 MG PO TABS
ORAL_TABLET | ORAL | Status: AC
  Filled 2012-01-31: qty 2

## 2012-01-31 MED ORDER — ASPIRIN-DIPYRIDAMOLE ER 25-200 MG PO CP12
1.0000 | ORAL_CAPSULE | Freq: Two times a day (BID) | ORAL | Status: DC
  Administered 2012-01-31 – 2012-02-07 (×14): 1 via ORAL
  Filled 2012-01-31 (×17): qty 1

## 2012-01-31 MED ORDER — NYSTATIN 100000 UNIT/GM EX POWD
Freq: Three times a day (TID) | CUTANEOUS | Status: DC
  Administered 2012-01-31 – 2012-02-07 (×16): via TOPICAL
  Filled 2012-01-31 (×2): qty 15

## 2012-01-31 MED ORDER — ENOXAPARIN SODIUM 40 MG/0.4ML ~~LOC~~ SOLN
40.0000 mg | SUBCUTANEOUS | Status: DC
  Administered 2012-01-31 – 2012-02-06 (×7): 40 mg via SUBCUTANEOUS
  Filled 2012-01-31 (×8): qty 0.4

## 2012-01-31 MED ORDER — TRAZODONE HCL 50 MG PO TABS
25.0000 mg | ORAL_TABLET | Freq: Every evening | ORAL | Status: DC | PRN
  Administered 2012-02-01: 50 mg via ORAL
  Filled 2012-01-31: qty 1

## 2012-01-31 MED ORDER — DIPHENHYDRAMINE HCL 12.5 MG/5ML PO ELIX: 12.5000 mg | ORAL_SOLUTION | Freq: Four times a day (QID) | ORAL | Status: DC | PRN

## 2012-01-31 MED ORDER — BISACODYL 10 MG RE SUPP
10.0000 mg | Freq: Every day | RECTAL | Status: DC | PRN
  Administered 2012-02-03 – 2012-02-04 (×2): 10 mg via RECTAL
  Filled 2012-01-31 (×3): qty 1

## 2012-01-31 MED ORDER — INSULIN ASPART 100 UNIT/ML ~~LOC~~ SOLN
0.0000 [IU] | Freq: Three times a day (TID) | SUBCUTANEOUS | Status: DC
  Administered 2012-02-01: 3 [IU] via SUBCUTANEOUS
  Administered 2012-02-01 – 2012-02-04 (×5): 2 [IU] via SUBCUTANEOUS
  Administered 2012-02-04: 3 [IU] via SUBCUTANEOUS
  Administered 2012-02-05: 2 [IU] via SUBCUTANEOUS
  Administered 2012-02-05 – 2012-02-06 (×2): 3 [IU] via SUBCUTANEOUS
  Administered 2012-02-06: 2 [IU] via SUBCUTANEOUS

## 2012-01-31 MED ORDER — OXYCODONE-ACETAMINOPHEN 5-325 MG PO TABS
1.0000 | ORAL_TABLET | ORAL | Status: DC | PRN
  Administered 2012-01-31: 2 via ORAL

## 2012-01-31 MED ORDER — HYDROMORPHONE HCL PF 1 MG/ML IJ SOLN: 0.5000 mg | INTRAMUSCULAR | Status: DC | PRN

## 2012-01-31 MED ORDER — PROCHLORPERAZINE EDISYLATE 5 MG/ML IJ SOLN
5.0000 mg | Freq: Four times a day (QID) | INTRAMUSCULAR | Status: DC | PRN
  Filled 2012-01-31: qty 2

## 2012-01-31 NOTE — Progress Notes (Addendum)
[facility] [place]  Progress Note Bypass Surgery  Date of Surgery: 01/23/2012 - 01/29/2012  Procedure(s): BYPASS GRAFT AXILLA-BIFEMORAL Surgeon: Surgeon(s): Sherren Kerns, MD  2 Days Post-Op  History of Present Illness  Donald [name] is a 66 y.o. male who is S/P Procedure(s): BYPASS GRAFT AXILLA-BIFEMORAL left.  The patient's pre-op symptoms of pain  are Improved . Patients pain is well controlled.  Pt C/O some burning sensation in feet but state they feel warmer and has been more comfortable. Pt has chronic pain issues  Significant Diagnostic Studies: CBC Lab Results  Component Value Date   WBC 5.9 01/30/2012   HGB 8.2* 01/30/2012   HCT 25.4* 01/30/2012   MCV 81.7 01/30/2012   PLT 214 01/30/2012    BMET     Component Value Date/Time   NA 138 01/30/2012 0420   NA 147* 10/12/2011 0959   K 4.2 01/30/2012 0420   K 5.2* 10/12/2011 0959   CL 104 01/30/2012 0420   CL 102 10/12/2011 0959   CO2 23 01/30/2012 0420   CO2 29 10/12/2011 0959   GLUCOSE 172* 01/30/2012 0420   GLUCOSE 244* 10/12/2011 0959   BUN 6 01/30/2012 0420   BUN 13 10/12/2011 0959   CREATININE 0.88 01/30/2012 0420   CREATININE 1.0 10/12/2011 0959   CALCIUM 8.0* 01/30/2012 0420   CALCIUM 9.0 10/12/2011 0959   GFRNONAA 86* 01/30/2012 0420   GFRAA >90 01/30/2012 0420    COAG Lab Results  Component Value Date   INR 1.05 01/24/2012   INR 0.98 01/23/2012   INR 0.95 02/07/2011     Physical Examination  BP Readings from Last 3 Encounters:  01/31/12 127/54  01/31/12 127/54  01/31/12 127/54   Temp Readings from Last 3 Encounters:  01/31/12 98.1 F (36.7 C) Oral  01/31/12 98.1 F (36.7 C) Oral  01/31/12 98.1 F (36.7 C) Oral   SpO2 Readings from Last 3 Encounters:  01/31/12 93%  01/31/12 93%  01/31/12 93%   Pulse Readings from Last 3 Encounters:  01/31/12 91  01/31/12 91  01/31/12 91    Pt is A&O x 3 Left chest and Bilat groin extremity: Incision/s is/are clean,dry.intact, and   healing without hematoma, erythema or drainage Limb is warm; with good color Left foot much warmer, pink and now has good doppler signal  Right Dorsalis Pedis pulse is monophasic by Doppler RightPosterior tibial pulse is  monophasic by Doppler  Left Dorsalis Pedis pulse is monophasic by Doppler LeftPosterior tibial pulse is  monophasic by Doppler   Assessment/Plan: Pt. Doing well, Left foot much warmer, pink and now has good doppler signal Post-op pain is controlled Wounds are healing well PT/OT for ambulation Continue wound care as ordered Dc PCA Transfer to floor  [name] [phone] 01/31/2012 8:24 AM   Dusky left first and 2nd toe Feet warm and pink bilat Incisions healing Pt feels better overall   Ax bifem patent on duplex ABI 0.3 should improve with time now that profunda flow improved  To 2000 today To Rehab when bed available  Fabienne Bruns, MD [facility] [place] Office: [phone] Pager: [phone]

## 2012-01-31 NOTE — Progress Notes (Signed)
Met with pt and his wife at bedside to discuss CIR. Pt would benefit from inpatient rehab and they are in agreement with plan for admission to CIR today. Pt's nurse and CM aware. Working on [employer] admission for today. Please call for questions: [phone].

## 2012-01-31 NOTE — Progress Notes (Signed)
Clinical Social Work  Per chart review, patient to admit to CIR. CSW is signing off but available if needed.  Caylan Chenard, LCSW [phone] 

## 2012-01-31 NOTE — Progress Notes (Signed)
Physical Therapy Treatment Patient Details Name: Donald [name] MRN: [id number] DOB: 1945/10/21 Today's Date: 01/31/2012 Time: 4098-1191 PT Time Calculation (min): 13 min  PT Assessment / Plan / Recommendation Comments on Treatment Session  self-limiting today, but approp. for rehab    Follow Up Recommendations  Post acute inpatient rehab    Barriers to Discharge        Equipment Recommendations       Recommendations for Other Services    Frequency     Plan Discharge plan remains appropriate    Precautions / Restrictions Precautions Precautions: Fall Precaution Comments: pt with "burn" near sacral area of back from recent radiation as well as redness on scrotal area which causes pain. cushion in room and pt educated on pressure relief   Pertinent Vitals/Pain     Mobility  Transfers Transfers: Sit to Stand;Stand to Sit Sit to Stand: 3: Mod assist Stand to Sit: 3: Mod assist Details for Transfer Assistance: vc's for safest technique; stability assist from the R side. Ambulation/Gait Ambulation/Gait Assistance: 3: Mod assist Ambulation Distance (Feet): 12 Feet Assistive device: Straight cane Ambulation/Gait Assistance Details: HP gait on L with step to gait pattern Gait Pattern: Step-to pattern;Decreased step length - left;Decreased stance time - right;Decreased stride length Stairs: No Wheelchair Mobility Wheelchair Mobility: No    Exercises     PT Diagnosis:    PT Problem List:   PT Treatment Interventions:     PT Goals Acute Rehab PT Goals Time For Goal Achievement: 02/13/12 Potential to Achieve Goals: Good PT Goal: Supine/Side to Sit - Progress: Progressing toward goal PT Goal: Sit to Stand - Progress: Progressing toward goal PT Goal: Stand to Sit - Progress: Progressing toward goal PT Transfer Goal: Bed to Chair/Chair to Bed - Progress: Progressing toward goal PT Goal: Ambulate - Progress: Not met PT Goal: Up/Down Stairs - Progress: Not met  Visit  Information  Last PT Received On: 01/31/12 Assistance Needed: +2    Subjective Data  Subjective: I'll do more tomorrow.   Cognition  Overall Cognitive Status: Appears within functional limits for tasks assessed/performed Arousal/Alertness: Awake/alert Orientation Level: Appears intact for tasks assessed Behavior During Session: [facility] for tasks performed Safety/Judgement: Impulsive    Balance  Static Sitting Balance Static Sitting - Balance Support: Right upper extremity supported Static Sitting - Level of Assistance: 5: Stand by assistance Static Standing Balance Static Standing - Balance Support: Right upper extremity supported;During functional activity Static Standing - Level of Assistance: 4: Min assist  End of Session PT - End of Session Activity Tolerance: Patient tolerated treatment well;Patient limited by pain;Other (comment) (self limiting today) Patient left: in chair;with call bell/phone within reach;with family/visitor present Nurse Communication: Mobility status   GP     Lavera Vandermeer, Savir Erdmann 01/31/2012, 3:53 PM  01/31/2012  [place], PT [phone] (pager)

## 2012-01-31 NOTE — H&P (Signed)
Physical Medicine and Rehabilitation Admission H&P    Chief Complaint  Patient presents with  . PVD, history of CVA with spastic L-HP  : HPI: Donald [name] is a 66 y.o. 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 LE CTA revealed significant PVD. Pre-op cardiac cath by Dr Clifton James total occlusion of R-coronary artery with good L-R collaterals and was cleared for procedure. On 01/29/12, patient underwent L-axillary bifemoral BG by Dr. Darrick Penna. Inpatient rehab recommended for progression.  She complains of rectal pain has had constipation Review of Systems  Respiratory: Negative for shortness of breath. Cough: in  mornings.    Cardiovascular: Negative for chest pain and palpitations.  Gastrointestinal: Positive for constipation. Negative for heartburn and nausea.  Genitourinary: Negative for urgency and frequency.  Musculoskeletal: Positive for myalgias.        Painful right calf.  Neurological: Negative for headaches.  Psychiatric/Behavioral: Positive for depression (frustrated about asking for help with hygeine/feeling helpless.).   Past Medical History  Diagnosis Date  . GERD (gastroesophageal reflux disease)   . Hyperlipidemia   . Hypertension   . Peripheral vascular disease   . Type II diabetes mellitus   . Pneumonia   . Internal and external bleeding hemorrhoids   . Stroke 2008    "left side weak; unable to move left hand still" (01/23/2012)  . History of gout     left great toe  . anal ca dx'd 08/2007    S/P chemo, radiation, biopsies  . Metastases to the liver dx'd 08/2010   Past Surgical History  Procedure Date  . Carotid endarterectomy 2008    right  . Liver cryoablation 08/2010    "chemo shrunk the cancer 50% then they went in and burned the rest of it"  . Mass biopsy     rectal; "found cancer"  . Axillary-femoral bypass graft  01/29/2012    Procedure: BYPASS GRAFT AXILLA-BIFEMORAL;  Surgeon: Sherren Kerns, MD;  Location: [facility] OR;  Service: Vascular;  Laterality: N/A;  Left axilla-bifemoral bypass using gore-tex graft.    No family history on file.  Social History: Married. Independent with cane PTA. Ambulated household distances. Needed assist with ADLs. He reports that he has been smoking Cigarettes. He has a 100 pack-year smoking history. He has never used smokeless tobacco. He reports that he uses illicit drugs (Marijuana). He reports that he does not drink alcohol. Wife supportive.   Allergies  Allergen Reactions  . Other     Became hypotensive. Pt states allergic to "muscle relaxer" but unsure of name.    Scheduled Meds:    . acidophilus  2 capsule Oral Daily  . atorvastatin  40 mg Oral q1800  . dipyridamole-aspirin  1 capsule Oral BID  . docusate sodium  100 mg Oral BID  . enoxaparin  40 mg Subcutaneous Q24H  . insulin aspart  0-15 Units Subcutaneous TID WC  . insulin aspart  0-5 Units Subcutaneous QHS  . lisinopril  2.5 mg Oral Daily  . metFORMIN  500 mg Oral BID WC  . nystatin   Topical TID  . pantoprazole  40 mg Oral Q1200  . polyethylene glycol  17 g Oral Daily    Medications Prior to Admission  Medication Sig Dispense Refill  . acidophilus (RISAQUAD) CAPS Take by mouth daily.        . bisacodyl (DULCOLAX) 5 MG  EC tablet Take 20 mg by mouth daily.       Marland Kitchen dipyridamole-aspirin (AGGRENOX) 25-200 MG per 12 hr capsule Take 1 capsule by mouth 2 (two) times daily.       Marland Kitchen gabapentin (NEURONTIN) 100 MG capsule Take 200 mg by mouth at bedtime.      Marland Kitchen lisinopril (PRINIVIL,ZESTRIL) 5 MG tablet Take 5 mg by mouth daily.        . metFORMIN (GLUMETZA) 500 MG (MOD) 24 hr tablet Take 500 mg by mouth 2 (two) times daily with a meal.       . omega-3 acid ethyl esters (LOVAZA) 1 G capsule Take 2 g by mouth 2 (two) times daily.        Marland Kitchen omeprazole (PRILOSEC) 20 MG capsule Take 40 mg by mouth daily.          Home:     Functional History:    Functional Status:  Mobility:          ADL:    Cognition:       Blood pressure 121/65, pulse 93, temperature 98.7 F (37.1 C), temperature source Oral, resp. rate 18, SpO2 97.00%. Physical Exam  Nursing note and vitals reviewed. Constitutional: He is oriented to person, place, and time. He appears well-developed and well-nourished.  HENT:  Head: Normocephalic.  Eyes: Pupils are equal, round, and reactive to light.  Neck: Normal range of motion.  Cardiovascular: Normal rate and regular rhythm.   Pulmonary/Chest: Effort normal and breath sounds normal.       Upper airway rhonchi  Abdominal: Soft. Bowel sounds are normal.  Musculoskeletal:       Right calf tender to palpation and with hamstring stretch. Negative Homans'.   Neurological: He is alert and oriented to person, place, and time.       Raspy voice. Left facial weakness. LUE with contracture at hand and elbow.   Skin:       Incision bilateral inguinal area reddened/chapped? With tenderness and derma bond.  Left upper chest incision clean and dry with derma bond and tender to touch.  Irritated perineum.   Psychiatric: His affect is labile (when discussing  current  limitations/deficits). Cognition and memory are normal.  Tone Ashworth grade 3 in the left biceps finger and wrist flexor as well as the shoulder adductor Motor strength is 2 minus in the left deltoid, biceps, triceps, grip, hip flexors,3 minus knee extensors, 2 minusankle dorsiflexor plantar flexor  BASIC METABOLIC PANEL     Status: Abnormal   Collection Time   01/30/12  4:20 AM      Component Value Range Comment   Sodium 138  135 - 145 mEq/L    Potassium 4.2  3.5 - 5.1 mEq/L    Chloride 104  96 - 112 mEq/L    CO2 23  19 - 32 mEq/L    Glucose, Bld 172 (*) 70 - 99 mg/dL    BUN 6  6 - 23 mg/dL    Creatinine, Ser 1.61  0.50 - 1.35 mg/dL    Calcium 8.0 (*) 8.4 - 10.5 mg/dL    GFR calc non Af Amer 86 (*) >90  mL/min    GFR calc Af Amer >90  >90 mL/min   CBC     Status: Abnormal   Collection Time   01/30/12  4:20 AM      Component Value Range Comment   WBC 5.9  4.0 - 10.5 K/uL    RBC 3.11 (*) 4.22 -  5.81 MIL/uL    Hemoglobin 8.2 (*) 13.0 - 17.0 g/dL DELTA CHECK NOTED   HCT 25.4 (*) 39.0 - 52.0 %    MCV 81.7  78.0 - 100.0 fL    MCH 26.4  26.0 - 34.0 pg    MCHC 32.3  30.0 - 36.0 g/dL    RDW 46.9  62.9 - 52.8 %    Platelets 214  150 - 400 K/uL   GLUCOSE, CAPILLARY     Status: Abnormal   Collection Time   01/30/12  5:30 PM      Component Value Range Comment   Glucose-Capillary 140 (*) 70 - 99 mg/dL   GLUCOSE, CAPILLARY     Status: Abnormal   Collection Time   01/30/12  9:54 PM      Component Value Range Comment   Glucose-Capillary 131 (*) 70 - 99 mg/dL    Comment 1 Notify RN      Comment 2 Documented in Chart     GLUCOSE, CAPILLARY     Status: Abnormal   Collection Time   01/31/12  8:12 AM      Component Value Range Comment   Glucose-Capillary 145 (*) 70 - 99 mg/dL    Comment 1 Notify RN      No results found.  Post Admission Physician Evaluation: 1. 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 2. Patient is admitted to receive collaborative, interdisciplinary care between the physiatrist, rehab nursing staff, and therapy team. 3. Patient's level of medical complexity and substantial therapy needs in context of that medical necessity cannot be provided at a lesser intensity of care such as a SNF. 4. Patient has experienced substantial functional loss from his/her baseline which was documented above under the "Functional History" and "Functional Status" headings.  Judging by the patient's diagnosis, physical exam, and functional history, the patient has potential for functional progress which will result in measurable gains while  on inpatient rehab.  These gains will be of substantial and practical use upon discharge  in facilitating mobility and self-care at the household level. 5. Physiatrist will provide 24 hour management of medical needs as well as oversight of the therapy plan/treatment and provide guidance as appropriate regarding the interaction of the two. 6. 24 hour rehab nursing will assist with bladder management, bowel management, safety, skin/wound care, disease management, medication administration, pain management and patient education  and help integrate therapy concepts, techniques,education, etc. 7. PT will assess and treat for:  Pre-gait training, gait training, mobility, endurance, safety, equipment.  Goals are: Supervision to min assist with mobility. 8. OT will assess and treat for: ADLs, neuromuscular reeducation, safety, endurance, activity tolerance.   Goals are: Supervision ADLs. 9. SLP will assess and treat for: Not applicable.  Goals are: Not applicable. 10. Case Management and Social Worker will assess and treat for psychological issues and discharge planning. 11. Team conference will be held weekly to assess progress toward goals and to determine barriers to discharge. 12. Patient will receive at least 3 hours of therapy per day at least 5 days per week. 13. ELOS: 2 weeks      Prognosis:  excellent and fair   Medical Problem List and Plan: 1. DVT Prophylaxis/Anticoagulation: Pharmaceutical: Lovenox 2. Pain Management: prn hydrocodone.  Reporting foot pain with ambulation.  3. Mood: Offer ego support and encouragement. Will have LCSW follow up for  full evaluation.  4. Neuropsych: This patient is capable of making decisions on his/her own behalf. 5. DM type 2: Monitor with ac/hs checks. Resume metformin. Continue SSI for elevated BS management/wound healing.  6. CVA with spastic L-HP: Resume aggrenox.   7. HTN: monitor with bid checks.  Continue lisinopril. 8. Chronic constipation:  Will add  miralax additionally.  Resume probiotic.    01/31/2012, 5:27 PM

## 2012-01-31 NOTE — PMR Pre-admission (Signed)
PMR Admission Coordinator Pre-Admission Assessment  Patient: Donald [name] is an 66 y.o., male MRN: [id number] DOB: [birth year] Height: 5\' 9"  (175.3 cm) Weight: 79.9 kg (176 lb 2.4 oz)  Insurance Information PRIMARY: Medicare      Policy#: [id number]      Subscriber: self       Employer:retired Eff. 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 male with CVA with left sided weakness, CAD, DM, PAD, tobacco abuse-2PPD smoker, admitted via ED on 01/23/12 with right foot pain due to worsening of occlusive disease. LE CTA revealed significant PVD. Pre-op cardiac cath by Dr Clifton James total occlusion of R-coronary artery with good L-R collaterals and was cleared for procedure. On 01/29/12, patient underwent L-axillary bifemoral BG by Dr. Darrick Penna.   Past Medical History  Past Medical History  Diagnosis Date  . GERD (gastroesophageal reflux disease)   . Hyperlipidemia   . Hypertension   . Peripheral vascular disease   . Type II diabetes mellitus   . Pneumonia   . Internal and external bleeding hemorrhoids   . Stroke 2008    "left side weak; unable to move left hand still" (01/23/2012)  . History of gout     left great toe  . anal ca dx'd 08/2007    S/P chemo, radiation, biopsies  . Metastases to the liver dx'd 08/2010    Family History  family history is not on file.  Prior Rehab/Hospitalizations:  CIR in 2008 for (R) CVA  Current Medications  Current facility-administered medications:0.9 %  sodium chloride infusion, , Intravenous, Continuous, Amelia Jo [place], [place], Last Rate: 50 mL/hr at 01/31/12 1100;  alum & mag hydroxide-simeth (MAALOX/MYLANTA) 200-200-20 MG/5ML suspension 15-30 mL, 15-30 mL, Oral, Q2H PRN, Sherren Kerns, MD;  aspirin chewable tablet 81 mg, 81 mg, Oral, Daily, Amelia Jo [place], [place], 81 mg at 01/31/12 0904 atorvastatin (LIPITOR) tablet 40 mg, 40 mg, Oral, q1800, Lewayne Bunting, MD, 40 mg at 01/30/12 1737;  diphenhydrAMINE (BENADRYL) 12.5 MG/5ML elixir 12.5 mg, 12.5 mg, Oral, Q6H PRN, [name], PA;  diphenhydrAMINE (BENADRYL) injection 12.5 mg, 12.5 mg, Intravenous, Q6H PRN, [name], PA;  docusate sodium (COLACE) capsule 100 mg, 100 mg, Oral, BID, Sherren Kerns, MD, 100 mg at 01/31/12 0903 enoxaparin (LOVENOX) injection 40 mg, 40 mg, Subcutaneous, Q24H, Regina J [place], [place], 40 mg at 01/30/12 1737;  guaiFENesin-dextromethorphan (ROBITUSSIN DM) 100-10 MG/5ML syrup 15 mL, 15 mL, Oral, Q4H PRN, Sherren Kerns, MD;  hydrALAZINE (APRESOLINE) injection 10 mg, 10 mg, Intravenous, Q2H PRN, Sherren Kerns, MD;  HYDROmorphone (DILAUDID) injection 0.5-1 mg, 0.5-1 mg, Intravenous, Q2H PRN, Amelia Jo Roczniak, PA insulin aspart (novoLOG) injection 0-15 Units, 0-15 Units, Subcutaneous, TID  WC, Chuck Hint, MD, 3 Units at 01/31/12 1233;  insulin aspart (novoLOG) injection 0-5 Units, 0-5 Units, Subcutaneous, QHS, Chuck Hint, MD;  labetalol (NORMODYNE,TRANDATE) injection 10 mg, 10 mg, Intravenous, Q2H PRN, Sherren Kerns, MD;  lisinopril (PRINIVIL,ZESTRIL) tablet 2.5 mg, 2.5 mg, Oral, Daily, Lewayne Bunting, MD, 2.5 mg at 01/31/12 0903 metoprolol (LOPRESSOR) injection 2-5 mg, 2-5 mg, Intravenous, Q2H PRN, Sherren Kerns, MD;  naloxone [facility]) injection 0.4 mg, 0.4 mg, Intravenous, PRN, Amelia Jo Roczniak, PA;  ondansetron (ZOFRAN) injection 4  mg, 4 mg, Intravenous, Q6H PRN, Sherren Kerns, MD;  oxyCODONE-acetaminophen (PERCOCET/ROXICET) 5-325 MG per tablet 1-2 tablet, 1-2 tablet, Oral, Q4H PRN, [name], PA, 2 tablet at 01/31/12 0903 pantoprazole (PROTONIX) EC tablet 40 mg, 40 mg, Oral, Q1200, Sherren Kerns, MD, 40 mg at 01/31/12 1233;  phenol (CHLORASEPTIC) mouth spray 1 spray, 1 spray, Mouth/Throat, PRN, Sherren Kerns, MD;  potassium chloride SA (K-DUR,KLOR-CON) CR tablet 20-40 mEq, 20-40 mEq, Oral, Daily PRN, Amelia Jo Roczniak, PA;  sodium chloride 0.9 % injection 10-40 mL, 10-40 mL, Intracatheter, PRN, Sherren Kerns, MD, 10 mL at 01/28/12 0414 sodium chloride 0.9 % injection 9 mL, 9 mL, Intravenous, PRN, [name], PA;  sodium phosphate (FLEET) 7-19 GM/118ML enema 1 enema, 1 enema, Rectal, Daily PRN, [name], PA;  DISCONTD: HYDROmorphone (DILAUDID) PCA injection 0.3 mg/mL, , Intravenous, Q4H, Amelia Jo [place], [place], 1.2 mg at 01/31/12 1610  Patients Current Diet: Carb Control  Precautions / Restrictions Precautions Precautions: Fall Precaution Comments: pt with "burn" near sacral area of back from recent radiation as well as redness on scrotal area which causes pain. cushion in room and pt educated on pressure relief Restrictions Weight Bearing Restrictions: No   Prior Activity Level Community (5-7x/wk): Active daily  Home Assistive Devices / Equipment Home Assistive Devices/Equipment: Cane (specify quad or straight);Shower chair with back;Eyeglasses;Dentures (specify type);Grab bars in shower;Hand-held shower hose (straight cane; upper plate) Home Adaptive Equipment: Walker - rolling (toilet riser)  Prior Functional Level Prior Function Level of Independence: Needs assistance Needs Assistance: Bathing;Dressing Able to Take Stairs?: Yes (but pt feels he will have to get a stair lift now) Driving: Yes Vocation: Retired  Current Functional Level Cognition  Arousal/Alertness:  Awake/alert Overall Cognitive Status: Impaired Current Attention Level: Alternating Orientation Level: Oriented X4 Safety/Judgement: Impulsive    Extremity Assessment (includes Sensation/Coordination)  RUE ROM/Strength/Tone: Deficits RUE ROM/Strength/Tone Deficits: grossly 4 to 4+/5 RUE Coordination: WFL - gross/fine motor  RLE ROM/Strength/Tone: Within functional levels RLE Sensation: WFL - Light Touch    ADLs  Eating/Feeding: Simulated;Minimal assistance Where Assessed - Eating/Feeding: Chair Grooming: Simulated;Moderate assistance Where Assessed - Grooming: Supported standing Upper Body Dressing: Simulated;Moderate assistance Where Assessed - Upper Body Dressing: Supported sitting Lower Body Dressing: Performed;+1 Total assistance Where Assessed - Lower Body Dressing: Supported sit to Pharmacist, hospital: Simulated;Moderate assistance Toilet Transfer Method: Sit to Barista:  (from bed/chair) Toileting - Clothing Manipulation and Hygiene: Simulated;+1 Total assistance Where Assessed - Glass blower/designer Manipulation and Hygiene: Standing Equipment Used: Gait belt Transfers/Ambulation Related to ADLs: +2total A(pt=60%) with ambulation throughout room ADL Comments: fatigues very easily    Mobility  Bed Mobility: Supine to Sit;Sitting - Scoot to Edge of Bed Supine to Sit: 3: Mod assist Sitting - Scoot to Edge of Bed: 3: Mod assist    Transfers  Transfers: Sit to Stand;Stand to [employer] Transfers Sit to Stand: 3: Mod assist Stand to Sit: 3:  Mod assist Stand Pivot Transfers: 3: Mod assist    Ambulation / Gait / Stairs / Wheelchair Mobility  Ambulation/Gait Ambulation/Gait Assistance: 1: +2 Total assist Ambulation/Gait: Patient Percentage: 60% Ambulation Distance (Feet): 6 Feet Ambulation/Gait Assistance Details: stable, but moderate HP gait with L foot dragging behind in ER position Gait Pattern: Step-to pattern;Decreased step length -  left;Decreased stance time - right;Decreased stride length Stairs: No Wheelchair Mobility Wheelchair Mobility: No    Posture / Balance Static Sitting Balance Static Sitting - Balance Support: Right upper extremity supported;No upper extremity supported;Feet supported Static Sitting - Level of Assistance: 5: Stand by assistance Static Sitting - Comment/# of Minutes: 4, handled minor balance challenges at EOB Static Standing Balance Static Standing - Balance Support: Right upper extremity supported;During functional activity Static Standing - Level of Assistance: 3: Mod assist     Previous Home Environment Living Arrangements: Spouse/significant other Lives With: Spouse Available Help at Discharge: Family Type of Home: House Home Layout: Two level Alternate Level Stairs-Rails: Right;Left Alternate Level Stairs-Number of Steps: flight Home Access: Stairs to enter Entrance Stairs-Rails: Doctor, general practice of Steps: 10 Bathroom Shower/Tub: Psychologist, counselling;Door Firefighter: Standard Home Care Services: No  Discharge Living Setting Plans for Discharge Living Setting: Patient's home;House;Lives with (comment) (wife) Type of Home at Discharge: House Discharge Home Layout: Two level;1/2 bath on main level Alternate Level Stairs-Rails: Can reach both Discharge Home Access: Stairs to enter Entrance Stairs-Rails: None Entrance Stairs-Number of Steps: 1 STE back entrance Do you have any problems obtaining your medications?: No  Social/Family/Support Systems Patient Roles: Spouse;Parent Contact Information: [phone] Anticipated Caregiver: Wife Anticipated Caregiver's Contact Information: [place] Shiraishi: [phone] Ability/Limitations of Caregiver: none Caregiver Availability: 24/7 (Wife has provided min ADL assist since pt had CVA in 2008.) Discharge Plan Discussed with Primary Caregiver: Yes Is Caregiver In Agreement with Plan?: Yes Does Caregiver/Family have Issues  with Lodging/Transportation while Pt is in Rehab?: No  Goals/Additional Needs Patient/Family Goal for Rehab: PT:S-min A, OT: S - mod A,  Expected length of stay: 1-2 weeks Cultural Considerations: none Dietary Needs: Carb Mod Equipment Needs: TBD Pt/Family Agrees to Admission and willing to participate: Yes Program Orientation Provided & Reviewed with Pt/Caregiver Including Roles  & Responsibilities: Yes  Patient Condition: This patient's condition remains as documented in the Consult dated 01/30/12, in which the Rehabilitation Physician determined and documented that the patient's condition is appropriate for intensive rehabilitative care in an inpatient rehabilitation facility.  Preadmission Screen Completed By:  Meryl Dare, 01/31/2012 12:56 PM ______________________________________________________________________   Discussed status with Dr. Wynn Banker on 01/31/12 at 1:06 PM and received telephone approval for admission today.  Admission Coordinator:  Meryl Dare, time 1:06 PM Dorna Bloom 01/31/12

## 2012-01-31 NOTE — Discharge Summary (Signed)
[facility] Discharge Summary   Patient ID:  Donald [name] MRN: [id number] DOB/AGE: [birth year] 66 y.o.  Admit date: 01/23/2012 Discharge date: 01/31/2012 Date of Surgery: 01/23/2012 - 01/29/2012 Surgeon: Moishe Spice): Sherren Kerns, MD  Admission Diagnosis: Peripheral vascular disease with claudication [443.9] dizzyness pvd PVD abnormal stress test AAA  Discharge Diagnoses:  Peripheral vascular disease with claudication [443.9] dizzyness pvd PVD abnormal stress test AAA  Secondary Diagnoses: Past Medical History  Diagnosis Date  . GERD (gastroesophageal reflux disease)   . Hyperlipidemia   . Hypertension   . Peripheral vascular disease   . Type II diabetes mellitus   . Pneumonia   . Internal and external bleeding hemorrhoids   . Stroke 2008    "left side weak; unable to move left hand still" (01/23/2012)  . History of gout     left great toe  . anal ca dx'd 08/2007    S/P chemo, radiation, biopsies  . Metastases to the liver dx'd 08/2010    Procedure(s): Left BYPASS GRAFT AXILLA-BIFEMORAL  Discharged Condition: good  HPI:  Donald [name] is a 66 y.o. 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 He was admitted for pain control and cardiac W/U.    Hospital Course:  Donald [name] is a 66 y.o. male who had cardiac cath and aortogram with runoff by Dr Dicie Beam which showed: Distal aortogram: The distal aorta is diffusely diseased with serial 50% stenoses.  Bilateral renal arteries patent with no obstructive disease  Right common iliac with diffuse disease. The right external iliac artery has 100% occlusion. The right common femoral artery has diffuse disease but reconstitutes fully. The right SFA is occluded at the ostium. There is a long segment of occlusion with reconstitution via collaterals in the distal SFA. The popliteal artery is patent. Difficult to visualize all three vessels to the foot.  Left common iliac  artery and external iliac artery are patent with diffuse disease. The left common femoral artery is patent. The left SFA is occluded at the ositum and reconstitutes via collaterals in the distal segment. The left popliteal artery has diffuse disease. There is 3 vessel runoff to the left foot.  Right and left axillary arteries are patent.  Pt was cleared from a cardiac standpoint and taken to the OR for S/P Left BYPASS GRAFT AXILLA-BIFEMORAL Extubated: POD # 0 Post-op wounds healing well Pt. Ambulating, voiding and taking PO diet without difficulty. Pt pain controlled with PO pain meds. Labs as below Complications:none  Consults:  Treatment Team:  Sherren Kerns, MD [facility], MD  Significant Diagnostic Studies: CBC Lab Results  Component Value Date   WBC 5.9 01/30/2012   HGB 8.2* 01/30/2012   HCT 25.4* 01/30/2012   MCV 81.7 01/30/2012   PLT 214 01/30/2012    BMET    Component Value Date/Time   NA 138 01/30/2012 0420   NA 147* 10/12/2011 0959   K 4.2 01/30/2012 0420   K 5.2* 10/12/2011 0959   CL 104 01/30/2012 0420   CL 102 10/12/2011 0959   CO2 23 01/30/2012 0420   CO2 29 10/12/2011 0959   GLUCOSE 172* 01/30/2012 0420   GLUCOSE 244* 10/12/2011 0959   BUN 6 01/30/2012 0420   BUN 13 10/12/2011 0959   CREATININE 0.88 01/30/2012 0420   CREATININE 1.0 10/12/2011 0959   CALCIUM 8.0* 01/30/2012 0420   CALCIUM 9.0 10/12/2011 0959   GFRNONAA 86* 01/30/2012 0420   GFRAA >90 01/30/2012 0420  COAG Lab Results  Component Value Date   INR 1.05 01/24/2012   INR 0.98 01/23/2012   INR 0.95 02/07/2011     Disposition:  Discharge to :Rehab Discharge Orders    Future Appointments: Provider: Department: Dept Phone: Center:   02/13/2012 2:30 PM [facility] [phone] None   03/26/2012 2:30 PM [facility] [phone] None   05/07/2012 2:30 PM [facility] [phone] None   05/19/2012 2:15 PM  Alvina Filbert [facility] [phone] None   05/19/2012 3:15 PM [facility] (Chest) [facility] [phone] [place]   05/19/2012 3:30 PM Wl-Ct 2 [facility] [phone] [place]   05/21/2012 2:30 PM Laurice Record, MD [facility] [phone] None      [place], [place]  Home Medication Instructions [id number]   Printed on:01/31/12 1012  Medication Information                    dipyridamole-aspirin (AGGRENOX) 25-200 MG per 12 hr capsule Take 1 capsule by mouth 2 (two) times daily.            metFORMIN (GLUMETZA) 500 MG (MOD) 24 hr tablet Take 500 mg by mouth 2 (two) times daily with a meal.            omega-3 acid ethyl esters (LOVAZA) 1 G capsule Take 2 g by mouth 2 (two) times daily.             omeprazole (PRILOSEC) 20 MG capsule Take 40 mg by mouth daily.            lisinopril (PRINIVIL,ZESTRIL) 5 MG tablet Take 5 mg by mouth daily.             acidophilus (RISAQUAD) CAPS Take by mouth daily.             bisacodyl (DULCOLAX) 5 MG EC tablet Take 20 mg by mouth daily.            gabapentin (NEURONTIN) 100 MG capsule Take 200 mg by mouth at bedtime.            Verbal and written Discharge instructions given to the patient. Wound care per Discharge AVS   Signed: [name] 01/31/2012, 10:12 AM

## 2012-01-31 NOTE — Progress Notes (Signed)
Pt to be discharged to inpatient rehab.  Report called to receiving RN, Jola Babinski.  Pt transferred to [id number] via wheelchair with all belongings.  Wife, Darel Hong, called and notified of new room.    Roselie Awkward, RN

## 2012-02-01 ENCOUNTER — Inpatient Hospital Stay (HOSPITAL_COMMUNITY): Payer: Medicare Other | Admitting: Physical Therapy

## 2012-02-01 ENCOUNTER — Other Ambulatory Visit: Payer: Self-pay | Admitting: *Deleted

## 2012-02-01 ENCOUNTER — Telehealth: Payer: Self-pay | Admitting: Vascular Surgery

## 2012-02-01 ENCOUNTER — Inpatient Hospital Stay (HOSPITAL_COMMUNITY): Payer: Medicare Other | Admitting: Occupational Therapy

## 2012-02-01 DIAGNOSIS — I739 Peripheral vascular disease, unspecified: Secondary | ICD-10-CM

## 2012-02-01 DIAGNOSIS — Z48812 Encounter for surgical aftercare following surgery on the circulatory system: Secondary | ICD-10-CM

## 2012-02-01 DIAGNOSIS — R5381 Other malaise: Secondary | ICD-10-CM

## 2012-02-01 DIAGNOSIS — Z5189 Encounter for other specified aftercare: Secondary | ICD-10-CM

## 2012-02-01 LAB — TYPE AND SCREEN
Unit division: 0
Unit division: 0

## 2012-02-01 LAB — GLUCOSE, CAPILLARY
Glucose-Capillary: 158 mg/dL — ABNORMAL HIGH (ref 70–99)
Glucose-Capillary: 131 mg/dL — ABNORMAL HIGH (ref 70–99)
Glucose-Capillary: 176 mg/dL — ABNORMAL HIGH (ref 70–99)

## 2012-02-01 LAB — CBC WITH DIFFERENTIAL/PLATELET
Basophils Absolute: 0 10*3/uL (ref 0.0–0.1)
Eosinophils Relative: 3 % (ref 0–5)
HCT: 25.4 % — ABNORMAL LOW (ref 39.0–52.0)
Hemoglobin: 7.9 g/dL — ABNORMAL LOW (ref 13.0–17.0)
Lymphocytes Relative: 16 % (ref 12–46)
Lymphs Abs: 1.3 10*3/uL (ref 0.7–4.0)
MCV: 82.7 fL (ref 78.0–100.0)
Monocytes Absolute: 1.1 10*3/uL — ABNORMAL HIGH (ref 0.1–1.0)
Monocytes Relative: 14 % — ABNORMAL HIGH (ref 3–12)
Neutro Abs: 5.4 10*3/uL (ref 1.7–7.7)
RBC: 3.07 MIL/uL — ABNORMAL LOW (ref 4.22–5.81)
WBC: 8.1 10*3/uL (ref 4.0–10.5)

## 2012-02-01 LAB — COMPREHENSIVE METABOLIC PANEL
AST: 62 U/L — ABNORMAL HIGH (ref 0–37)
CO2: 23 mEq/L (ref 19–32)
Calcium: 8.3 mg/dL — ABNORMAL LOW (ref 8.4–10.5)
Chloride: 102 mEq/L (ref 96–112)
Creatinine, Ser: 0.98 mg/dL (ref 0.50–1.35)
GFR calc Af Amer: >90 mL/min (ref >90)
GFR calc non Af Amer: 83 mL/min — ABNORMAL LOW (ref >90)
Glucose, Bld: 129 mg/dL — ABNORMAL HIGH (ref 70–99)
Total Bilirubin: 0.5 mg/dL (ref 0.3–1.2)

## 2012-02-01 LAB — URINE CULTURE

## 2012-02-01 MED ORDER — OMEGA-3-ACID ETHYL ESTERS 1 G PO CAPS
2.0000 g | ORAL_CAPSULE | Freq: Two times a day (BID) | ORAL | Status: DC
  Administered 2012-02-01 – 2012-02-07 (×13): 2 g via ORAL
  Filled 2012-02-01 (×16): qty 2

## 2012-02-01 MED ORDER — ALTEPLASE 2 MG IJ SOLR
2.0000 mg | Freq: Once | INTRAMUSCULAR | Status: AC
  Administered 2012-02-01: 2 mg
  Filled 2012-02-01: qty 2

## 2012-02-01 MED ORDER — OXYCODONE HCL 10 MG PO TB12
10.0000 mg | ORAL_TABLET | Freq: Two times a day (BID) | ORAL | Status: DC
  Administered 2012-02-01 – 2012-02-07 (×13): 10 mg via ORAL
  Filled 2012-02-01 (×13): qty 1

## 2012-02-01 MED ORDER — FERROUS SULFATE 325 (65 FE) MG PO TABS
325.0000 mg | ORAL_TABLET | Freq: Two times a day (BID) | ORAL | Status: DC
  Administered 2012-02-01 – 2012-02-07 (×13): 325 mg via ORAL
  Filled 2012-02-01 (×15): qty 1

## 2012-02-01 NOTE — Plan of Care (Addendum)
Overall Plan of Care [facility]) Patient Details Name: Donald [name] MRN: [id number] DOB: December 03, 1945  Diagnosis:  Deconditioning after axillary femoral bpg, with premorbid spastic left hemiparesis  Primary Diagnosis:    Physical deconditioning Co-morbidities: dm, anal cancer, prior cva  Functional Problem List  Patient demonstrates impairments in the following areas: Balance, Behavior, Bowel, Edema, Endurance, Linguistic, Medication Management, Motor, Pain, Perception, Safety, Skin Integrity and Vision  Basic ADL's: eating, grooming, bathing, dressing and toileting Advanced ADL's: NA  Transfers:  bed mobility, bed to chair, toilet, tub/shower, car and furniture Locomotion:  ambulation, wheelchair mobility and stairs  Additional Impairments:  Functional use of upper extremity  Anticipated Outcomes Item Anticipated Outcome  Eating/Swallowing    Basic self-care  Min A for bathing, LB dressing, Mod I UB, grooming  Tolieting  Mod I  Bowel/Bladder  Continent of bowel and bladder  Transfers  Mod I  Locomotion  supervision  Communication    Cognition    Pain  Pain level < or = 3  Safety/Judgment    Other     Therapy Plan:   OT Frequency: 1-2 X/day, 60-90 minutes     Team Interventions: Item RN PT OT SLP SW TR Other  Self Care/Advanced ADL Retraining   x      Neuromuscular Re-Education  x x      Therapeutic Activities  x x      UE/LE Strength Training/ROM  x x      UE/LE Coordination Activities  x x      Visual/Perceptual Remediation/Compensation         DME/Adaptive Equipment Instruction  x x      Therapeutic Exercise  x x      Balance/Vestibular Training  x x      Patient/Family Education x x x      Cognitive Remediation/Compensation         Functional Mobility Training  x x      Ambulation/Gait Training  x x      Stair Training  x       Wheelchair Propulsion/Positioning  x       Functional Tourist information centre manager Reintegration  x x        Dysphagia/Aspiration Film/video editor         Bladder Management x        Bowel Management x        Disease Management/Prevention x        Pain Management x x x      Medication Management x        Skin Care/Wound Management x        Splinting/Orthotics  x x      Discharge Planning x x x      Psychosocial Support x                           Team Discharge Planning: Destination:  Home Projected Follow-up:  PT, Home Health and Outpatient Projected Equipment Needs:  Bedside Commode, Information systems manager, Biomedical engineer involved in discharge planning:  Yes  MD ELOS: 2 weeks Medical Rehab Prognosis:  Good Assessment: pt admitted for CIR therapies. The team will be addressing self-care, fxnl mobility, pain mgt, strength, adaptive equipment  Use, family ed. Goals are supervision to mod I except for lower ext dressing and bathing which are min assist.  The patient has been emotionally labile at times and the team will be providing emotional support.

## 2012-02-01 NOTE — Telephone Encounter (Addendum)
Message copied by Rosalyn Charters on Fri Feb 01, 2012  3:21 PM ------      Message from: Mona, [place] K      Created: Fri Feb 01, 2012 11:37 AM      Regarding: schedule                   ----- Message -----         From: Melene Plan, RN         Sent: 01/31/2012   2:03 PM           To: Sharee Pimple, CMA, #                        ----- Message -----         From: [name], PA         Sent: 01/31/2012  10:22 AM           To: Melene Plan, RN            3 weeks F/U left ax bifem - needs ABI's - [place]  notified patient of fu appt. with cef on 02-21-12 1pm l/v/m and mailed appt. letter

## 2012-02-01 NOTE — Progress Notes (Signed)
Physical Therapy Note  Patient Details  Name: ROSEVELT LUU MRN: [id number] Date of Birth: 1946-02-11 Today's Date: 02/01/2012  Time: 1330-1415 45 minutes  Pt c/o LE "burning, tingling" RN aware.  NMR for motor control of R LE.  Sit to stands and squats with adduction with min A to stand, pt only able to keep ball adducted < 60 seconds before fatigue.  Coordination tapping and ball roll under R foot with mod A for balance and coordination.  Gait training with RW with wrist splint vs hemi walker vs PFRW. Pt prefers and requires least assist with PFRW at this time.  Will continue to assess.  Pt requires frequent rest breaks this session due to LE "burning" and fatigue.  Individual therapy   Asta Corbridge 02/01/2012, 2:27 PM

## 2012-02-01 NOTE — Evaluation (Signed)
Physical Therapy Assessment and Plan  Patient Details  Name: Donald [name] MRN: [id number] Date of Birth: April 01, 1946  PT Diagnosis: Ataxic gait, Difficulty walking and Muscle weakness Rehab Potential: Good ELOS: 10-14 days   Today's Date: 02/01/2012 Time: 1030-1130 60 minutes Problem List:  Patient Active Problem List  Diagnosis  . SEC MALIGNANT NEOPLASM OF LARGE INTESTINE&RECTUM  . DM  . HYPERLIPIDEMIA  . HEMORRHOIDS  . CONSTIPATION  . CONSTIPATION, CHRONIC  . RECTAL BLEEDING  . NAUSEA AND VOMITING  . FLATULENCE-GAS-BLOATING  . RECTAL MASS  . ABDOMINAL PAIN -GENERALIZED  . CEREBROVASCULAR ACCIDENT, HX OF  . Anal cancer  . Anal carcinoma  . Atherosclerotic peripheral vascular disease with rest pain  . Preoperative evaluation to rule out surgical contraindication  . Physical deconditioning    Past Medical History:  Past Medical History  Diagnosis Date  . GERD (gastroesophageal reflux disease)   . Hyperlipidemia   . Hypertension   . Peripheral vascular disease   . Type II diabetes mellitus   . Pneumonia   . Internal and external bleeding hemorrhoids   . Stroke 2008    "left side weak; unable to move left hand still" (01/23/2012)  . History of gout     left great toe  . anal ca dx'd 08/2007    S/P chemo, radiation, biopsies  . Metastases to the liver dx'd 08/2010   Past Surgical History:  Past Surgical History  Procedure Date  . Carotid endarterectomy 2008    right  . Liver cryoablation 08/2010    "chemo shrunk the cancer 50% then they went in and burned the rest of it"  . Mass biopsy     rectal; "found cancer"  . Axillary-femoral bypass graft 01/29/2012    Procedure: BYPASS GRAFT AXILLA-BIFEMORAL;  Surgeon: Sherren Kerns, MD;  Location: [facility] OR;  Service: Vascular;  Laterality: N/A;  Left axilla-bifemoral bypass using gore-tex graft.     Assessment & Plan Clinical Impression: Patient is a 67 y.o. year old male with recent admission to the hospital on  01/23/12 with right foot pain due to worsening of occlusive disease. LE CTA revealed significant PVD. Pre-op cardiac cath by Dr Clifton James total occlusion of R-coronary artery with good L-R collaterals and was cleared for procedure. On 01/29/12, patient underwent L-axillary bifemoral BG by Dr. Darrick Penna. Patient transferred to CIR on 01/31/2012 .   Patient currently requires max with mobility secondary to muscle weakness, ataxia and decreased coordination and decreased standing balance and decreased balance strategies.  Prior to hospitalization, patient was supervision with mobility and lived with Spouse in a House home.  Home access is 10Stairs to enter.  Patient will benefit from skilled PT intervention to maximize safe functional mobility, minimize fall risk and decrease caregiver burden for planned discharge home with 24 hour supervision.  Anticipate patient will benefit from follow up OP at discharge.  PT - End of Session Activity Tolerance: Tolerates 30+ min activity with multiple rests Endurance Deficit: Yes PT Assessment Rehab Potential: Good PT Plan PT Frequency: 1-2 X/day, 60-90 minutes Estimated Length of Stay: 10-14 days PT Treatment/Interventions: Ambulation/gait training;Community reintegration;DME/adaptive equipment instruction;Neuromuscular re-education;Therapeutic Exercise;UE/LE Strength taining/ROM;Splinting/orthotics;Pain management;Discharge planning;Balance/vestibular training;Functional mobility training;Therapeutic Activities;UE/LE Coordination activities;Wheelchair propulsion/positioning;Stair training;Patient/family education PT Recommendation Follow Up Recommendations: Outpatient PT  PT Evaluation Precautions/Restrictions Precautions Precautions: Fall Restrictions Weight Bearing Restrictions: No Pain Pain Assessment Pain Score:   6 Pain Location: Leg Pain Orientation: Right;Left Pain Descriptors: Burning;Tingling;Constant Pain Intervention(s): RN made aware Home  Living/Prior Functioning Home Living Lives  With: Spouse Available Help at Discharge: Family Type of Home: House Home Access: Stairs to enter [employer] of Steps: 10 Entrance Stairs-Rails: Right;Left Home Layout: Two level Alternate Level Stairs-Number of Steps: flight Alternate Level Stairs-Rails: Right;Left Home Adaptive Equipment: Walker - rolling;Straight cane Prior Function Level of Independence: Requires assistive device for independence;Needs assistance with ADLs Able to Take Stairs?: Yes Driving: Yes Vocation: Retired  IT consultant Overall Cognitive Status: Appears within functional limits for tasks assessed Sensation Sensation Light Touch: Impaired Detail Light Touch Impaired Details: Impaired LLE;Impaired RLE;Impaired LUE Proprioception: Impaired Detail Proprioception Impaired Details: Impaired LLE;Impaired RLE;Impaired LUE Coordination Gross Motor Movements are Fluid and Coordinated: No Fine Motor Movements are Fluid and Coordinated: No Motor  Motor Motor: Ataxia;Abnormal tone;Abnormal postural alignment and control;Hemiplegia Motor - Skilled Clinical Observations: L sided weakness from previous CVA UE>LE, R LE ataxic  Mobility Bed Mobility Bed Mobility: Supine to Sit;Sit to Supine Supine to Sit: 5: Supervision Sitting - Scoot to Edge of Bed: 4: Min assist Sit to Supine: 4: Min assist Transfers Sit to Stand: 2: Max Multimedia programmer Transfers: 2: Max Actuary Details (indicate cue type and reason): pt with ataxic movements R LE, used SPC previously, requires max A for LOB during pivot turn Locomotion  Ambulation Ambulation: Yes Ambulation/Gait Assistance: 2: Max assist Ambulation Distance (Feet): 15 Feet Assistive device: Straight cane Ambulation/Gait Assistance Details: ataxic gait pattern, incorrect use of SPC, requires max A to correct LOB throughout gait Stairs / Additional Locomotion Stairs: Yes Stairs Assistance: 3: Mod  assist Stairs Assistance Details (indicate cue type and reason): cues for safety and assist for balance Stair Management Technique: One rail Right Wheelchair Mobility Wheelchair Mobility: Yes Wheelchair Assistance: 3: Mod assist Wheelchair Propulsion: Both lower extermities;Right upper extremity Wheelchair Parts Management: Needs assistance Distance: pt requires cues for use of LEs for propulsion, assist for steering  Trunk/Postural Assessment  Cervical Assessment Cervical Assessment: Within Functional Limits Thoracic Assessment Thoracic Assessment: Exceptions to [facility] Lumbar Assessment Lumbar Assessment: Exceptions to [facility] (rounded trunk) Postural Control Postural Control: Deficits on evaluation (pelvis fixed)  Balance Balance Balance Assessed: Yes Static Sitting Balance Static Sitting - Balance Support: Feet supported Static Sitting - Level of Assistance: 5: Stand by assistance Static Standing Balance Static Standing - Balance Support: During functional activity Static Standing - Level of Assistance: 3: Mod assist Dynamic Standing Balance Dynamic Standing - Level of Assistance: 2: Max assist (for functional activity) Extremity Assessment  RUE Assessment RUE Assessment: Within Functional Limits LUE Assessment LUE Assessment: Exceptions to WFL (L UE weakness;~80 degrees shld flex,2+/5 grip,~70-80elb flex) LUE Tone LUE Tone: Modified Ashworth Modified Ashworth Scale for Grading Hypertonia LUE: Affected part(s) rigid in flexion or extension RLE Assessment RLE Assessment:  (hip and knee WFL, DF 0/5, PF 3-/5) LLE Assessment LLE Assessment:  (grossly 3-/5, knee ext -10degrees, DF to neutral)  See FIM for current functional status Refer to Care Plan for Long Term Goals  Recommendations for other services: None  Discharge Criteria: Patient will be discharged from PT if patient refuses treatment 3 consecutive times without medical reason, if treatment goals not met, if there is a  change in medical status, if patient makes no progress towards goals or if patient is discharged from hospital.  The above assessment, treatment plan, treatment alternatives and goals were discussed and mutually agreed upon: by patient  Treatment initiated during session: Gait with RW with min A x 2 attempts 15'.  Pt with improved balance and stability with RW vs  SPC.  Pt reports he feels more comfortable with RW at this time.  Side stepping and turns with RW with min A, pt able to control RW without assist.  Nu step for activity tolerance and LE strength, pt able to perform 2 x 2 minutes level 2 before c/o increased LE pain and fatigue.  When pt fatigued pt with difficulty keeping L hand on RW, will try wrist splint for improved RW control.  SPT training with improved performance with RW vs SPC.  Marko Skalski 02/01/2012, 2:49 PM

## 2012-02-01 NOTE — Progress Notes (Signed)
This note has been reviewed and this clinician agrees with information provided.  

## 2012-02-01 NOTE — Progress Notes (Signed)
Patient information reviewed and entered into eRehab system by Derya Dettmann, RN, CRRN, PPS Coordinator.  Information including medical coding and functional independence measure will be reviewed and updated through discharge.     Per nursing patient was given "Data Collection Information Summary for Patients in Inpatient Rehabilitation Facilities with attached "Privacy Act Statement-Health Care Records" upon admission.  

## 2012-02-01 NOTE — Evaluation (Signed)
Occupational Therapy Assessment and Plan / Treatment Session Note  Patient Details  Name: Donald [name] MRN: [id number] Date of Birth: Sep 06, 1945  OT Diagnosis: abnormal posture, acute pain and muscle weakness (generalized) Rehab Potential: Rehab Potential: Good ELOS: 2 weeks   Today's Date: 02/01/2012 Time: 6578-4696 Time Calculation (min): 60 min  Problem List:  Patient Active Problem List  Diagnosis  . SEC MALIGNANT NEOPLASM OF LARGE INTESTINE&RECTUM  . DM  . HYPERLIPIDEMIA  . HEMORRHOIDS  . CONSTIPATION  . CONSTIPATION, CHRONIC  . RECTAL BLEEDING  . NAUSEA AND VOMITING  . FLATULENCE-GAS-BLOATING  . RECTAL MASS  . ABDOMINAL PAIN -GENERALIZED  . CEREBROVASCULAR ACCIDENT, HX OF  . Anal cancer  . Anal carcinoma  . Atherosclerotic peripheral vascular disease with rest pain  . Preoperative evaluation to rule out surgical contraindication  . Physical deconditioning    Past Medical History:  Past Medical History  Diagnosis Date  . GERD (gastroesophageal reflux disease)   . Hyperlipidemia   . Hypertension   . Peripheral vascular disease   . Type II diabetes mellitus   . Pneumonia   . Internal and external bleeding hemorrhoids   . Stroke 2008    "left side weak; unable to move left hand still" (01/23/2012)  . History of gout     left great toe  . anal ca dx'd 08/2007    S/P chemo, radiation, biopsies  . Metastases to the liver dx'd 08/2010   Past Surgical History:  Past Surgical History  Procedure Date  . Carotid endarterectomy 2008    right  . Liver cryoablation 08/2010    "chemo shrunk the cancer 50% then they went in and burned the rest of it"  . Mass biopsy     rectal; "found cancer"  . Axillary-femoral bypass graft 01/29/2012    Procedure: BYPASS GRAFT AXILLA-BIFEMORAL;  Surgeon: Sherren Kerns, MD;  Location: [facility] OR;  Service: Vascular;  Laterality: N/A;  Left axilla-bifemoral bypass using gore-tex graft.     Assessment & Plan Clinical Impression:  Patient is a 66 y.o. year old male with CVA with left sided weakness (CIR-'08), CAD, DM, PAD, tobacco abuse-2PPD smoker, admitted via ED on 01/23/12 with right foot pain due to worsening of occlusive disease. LE CTA revealed significant PVD. Pre-op cardiac cath by Dr Clifton James total occlusion of R-coronary artery with good L-R collaterals and was cleared for procedure. On 01/29/12, patient underwent L-axillary bifemoral BG by Dr. Darrick Penna.  Patient currently requires Mod A bathing, Max A toileting, LB dressing, and transfers secondary to muscle weakness, decreased cardiorespiratoy endurance, abnormal tone, ataxia and decreased coordination and decreased sitting balance, decreased standing balance, decreased postural control, old hemiplegia impacting his balance now and decreased balance strategies.  Prior to hospitalization, patient could complete ADLs with min.  Patient will benefit from skilled intervention to decrease level of assist with basic self-care skills and increase independence with basic self-care skills prior to discharge home with care partner.  Anticipate patient will require intermittent supervision and minimal physical assistance for bathing/dressing and follow up home health.  OT - End of Session Activity Tolerance: Tolerates 30+ min activity with multiple rests Endurance Deficit: Yes OT Assessment Rehab Potential: Good OT Plan OT Frequency: 1-2 X/day, 60-90 minutes Estimated Length of Stay: 2 weeks OT Treatment/Interventions: Balance/vestibular training;Community reintegration;Discharge planning;DME/adaptive equipment instruction;Functional mobility training;Neuromuscular re-education;Pain management;Patient/family education;Self Care/advanced ADL retraining;Therapeutic Activities;Therapeutic Exercise;UE/LE Strength taining/ROM;UE/LE Coordination activities;Splinting/orthotics OT Recommendation Follow Up Recommendations: Home health OT  OT Evaluation Precautions/Restrictions    Precautions  Precautions: Fall Restrictions Weight Bearing Restrictions: No General Chart Reviewed: Yes Family/Caregiver Present: No    Pain Pain Assessment Pain Assessment: 0-10 Pain Score: 10-Worst pain ever Pain Location: Foot Pain Orientation: Right;Left Pain Descriptors: Burning;Constant Pain Onset: On-going Pain Intervention(s): RN made aware Multiple Pain Sites: Yes 2nd Pain Site Pain Score: 10 Pain Location: Buttocks Pain Orientation: Medial Pain Descriptors: Burning;Constant Pain Onset: On-going Pain Intervention(s): RN made aware Home Living/Prior Functioning Home Living Lives With: Spouse Available Help at Discharge: Family Type of Home: House Home Access: Stairs to enter Secretary/administrator of Steps: 10 Entrance Stairs-Rails: Right;Left Home Layout: Two level;Bed/bath upstairs;1/2 bath on main level Bathroom Shower/Tub: Walk-in shower;Door Bathroom Toilet: Standard Home Adaptive Equipment: Grab bars in shower;Hand-held shower hose;Shower chair with back;Walker - rolling Prior Function Level of Independence: Needs assistance with ADLs Able to Take Stairs?: Yes Driving: Yes Vocation: Retired ADL   Vision/Perception  Vision - History Baseline Vision: Wears glasses only for reading Patient Visual Report: No change from baseline Vision - Assessment Eye Alignment: Within Functional Limits Perception Perception: Within Functional Limits Praxis Praxis: Intact  Cognition Overall Cognitive Status: Appears within functional limits for tasks assessed Arousal/Alertness: Awake/alert Orientation Level: Oriented X4 Awareness: Impaired Awareness Impairment: Anticipatory impairment Problem Solving: Appears intact Executive Function: Self Correcting Self Correcting: Impaired Self Correcting Impairment: Functional basic Behaviors: Impulsive Safety/Judgment: Impaired Sensation Sensation Light Touch: Impaired Detail Light Touch Impaired Details:  Impaired LUE;Impaired LLE- at baseline - ;left impaired Proprioception: Impaired Detail Proprioception Impaired Details: Impaired LUE;Impaired LLE Coordination Gross Motor Movements are Fluid and Coordinated: No Fine Motor Movements are Fluid and Coordinated: No Motor  Motor Motor: Hemiplegia is present but old- generalized weakness Mobility  Bed Mobility Bed Mobility: Supine to Sit;Sit to Supine Supine to Sit: 4: Min assist Sitting - Scoot to Edge of Bed: 4: Min assist Sit to Supine: 4: Min assist Transfers Sit to Stand: 2: Max assist  Trunk/Postural Assessment  Cervical Assessment Cervical Assessment: Within Functional Limits Thoracic Assessment Thoracic Assessment: Exceptions to [facility] (scapula abducted and downwardly rotated) Lumbar Assessment Lumbar Assessment: Exceptions to [facility] (rounded posture) Postural Control Postural Control: Deficits on evaluation (pelvis fixed)  Balance Balance Balance Assessed: Yes Static Sitting Balance Static Sitting - Balance Support: Feet supported Static Sitting - Level of Assistance: 5: Stand by assistance Static Standing Balance Static Standing - Balance Support: During functional activity Static Standing - Level of Assistance: 3: Mod assist Extremity/Trunk Assessment RUE Assessment RUE Assessment: Within Functional Limits LUE Assessment LUE Assessment: Exceptions to WFL (L UE weakness;~80 degrees shld flex,2+/5 grip,~70-80elb flex) LUE Tone LUE Tone: Modified Ashworth Modified Ashworth Scale for Grading Hypertonia LUE: Affected part(s) rigid in flexion or extension  See FIM for current functional status Refer to Care Plan for Long Term Goals  Recommendations for other services: None  Discharge Criteria: Patient will be discharged from OT if patient refuses treatment 3 consecutive times without medical reason, if treatment goals not met, if there is a change in medical status, if patient makes no progress towards goals or if patient  is discharged from hospital.  The above assessment, treatment plan, treatment alternatives and goals were discussed and mutually agreed upon: by patient   OT eval initiated and purpose, role, and goals discussed with patient.  Self care retraining session with bathing, toileting, dressing, grooming with focus on functional mobility, transfers, and standing balance. Pt Max +2 to ambulate safely with cane and presents with difficulty/incoordination with R LE follow through, Mod-Max A for sit<>stands from w/c  and toilet and for stand-pivot transfers, Mod A for standing balance.   Jackelyn Poling 02/01/2012, 11:54 AM

## 2012-02-01 NOTE — Progress Notes (Signed)
Still complains of pain in feet  Filed Vitals:   01/31/12 1552 02/01/12 0500  BP: 121/65 131/63  Pulse: 93 88  Temp: 98.7 F (37.1 C) 98.6 F (37 C)  TempSrc: Oral Oral  Resp: 18 18  SpO2: 97% 97%    Right foot warmer than left Toes left foot dusky similar to yesterday All groin and chest incisions healing  Continue to follow for now Some of his pain is related to his baseline neuropathy If continued pain in 4-6 weeks after re establishing profunda flow may need to reevaluate.   Fabienne Bruns, MD [facility] Office: [phone] Pager: [phone]

## 2012-02-01 NOTE — Progress Notes (Signed)
Occupational Therapy Session Note  Patient Details  Name: ABDIKARIM DODGE MRN: [id number] Date of Birth: [birth year]  Today's Date: 02/01/2012 Time: 1300-1330 Time Calculation (min): 30 min  Short Term Goals: Week 1:  OT Short Term Goal 1 (Week 1): Pt will perform bathing tasks sit<>stand Min A. OT Short Term Goal 2 (Week 1): Pt will dress LB using AE prn Mod A. OT Short Term Goal 3 (Week 1): Pt will maintain dynamic standing balance during functional activity Min A. OT Short Term Goal 4 (Week 1): Pt will perform sit<>stand for LB bathing/dressing Min A. OT Short Term Goal 5 (Week 1): Pt will perform toilet transfer Mod A.  Skilled Therapeutic Interventions/Progress Updates:   Participated in gym session with focus on sit<>stands, standing balance, weight shifts L and R and weight bearing on each leg for improved functional mobility. Pt performed toe tapping and heel tapping activity in supported standing with Max A for balance for improved coordination and dorsiflexion of R foot/ankle; also engaged in rolling physioball with R foot from heel to toe for increased control of R LE movement. Mod A for squat pivot transfer w/c to mat and Mod A for sit<>stand from mat.  Therapy Documentation Precautions:  Precautions Precautions: Fall Restrictions Weight Bearing Restrictions: No Pain: Burning sensation in right leg with weight bearing/activity that is on-going and decreases with rest, RN aware  See FIM for current functional status  Therapy/Group: Individual Therapy  Jackelyn Poling 02/01/2012, 2:50 PM

## 2012-02-01 NOTE — Progress Notes (Signed)
Subjective/Complaints: Feet hurt from taking lipitor last night. Wants to be on medication he uses at home.  Objective: Vital Signs: Blood pressure 131/63, pulse 88, temperature 98.6 F (37 C), temperature source Oral, resp. rate 18, SpO2 97.00%. No results found.  Basename 02/01/12 0630 01/30/12 0420  WBC 8.1 5.9  HGB 7.9* 8.2*  HCT 25.4* 25.4*  PLT 257 214    Basename 01/30/12 0420  NA 138  K 4.2  CL 104  CO2 23  GLUCOSE 172*  BUN 6  CREATININE 0.88  CALCIUM 8.0*   CBG (last 3)   Basename 02/01/12 0712 01/31/12 2126 01/31/12 1216  GLUCAP 125* 165* 157*    Wt Readings from Last 3 Encounters:  01/23/12 79.9 kg (176 lb 2.4 oz)  01/23/12 79.9 kg (176 lb 2.4 oz)  01/23/12 79.9 kg (176 lb 2.4 oz)    Physical Exam:  Constitutional: He is oriented to person, place, and time. He appears well-developed and well-nourished.  HENT:  Head: Normocephalic.  Eyes: Pupils are equal, round, and reactive to light.  Neck: Normal range of motion.  Cardiovascular: Normal rate and regular rhythm.  Pulmonary/Chest: Effort normal and breath sounds normal.  Upper airway generally clear Abdominal: Soft. Bowel sounds are normal.  Musculoskeletal:  Right calf tender to palpation and with hamstring stretch. Negative Homans'.  Neurological: He is alert and oriented to person, place, and time.   Left facial weakness. LUE with contracture at hand and elbow.  Tone Ashworth grade 3 in the left biceps, 1-2 finger and wrist flexor as well as the shoulder adductor  Motor strength is 2 minus in the left deltoid, biceps, triceps, grip, hip flexors,3 minus knee extensors, 2 minusankle dorsiflexor plantar flexor Skin:  Incision bilateral inguinal area slightly reddened with tenderness and derma bond. Left upper chest incision clean and dry with derma bond and tender to touch. Irritated perineum.  Psychiatric:  Cognition and memory are normal. He is a little emotional   Assessment/Plan: 1.  Functional deficits secondary to deconditioning after axillary-fem bpg (hx of right cva) which require 3+ hours per day of interdisciplinary therapy in a comprehensive inpatient rehab setting. Physiatrist is providing close team supervision and 24 hour management of active medical problems listed below. Physiatrist and rehab team continue to assess barriers to discharge/monitor patient progress toward functional and medical goals. FIM:                   Comprehension Comprehension Mode: Auditory Comprehension: 5-Understands basic 90% of the time/requires cueing < 10% of the time  Expression Expression Mode: Verbal Expression: 5-Expresses basic needs/ideas: With extra time/assistive device  Social Interaction Social Interaction: 5-Interacts appropriately 90% of the time - Needs monitoring or encouragement for participation or interaction.     Memory Memory: 4-Recognizes or recalls 75 - 89% of the time/requires cueing 10 - 24% of the time  Medical Problem List and Plan:  1. DVT Prophylaxis/Anticoagulation: Pharmaceutical: Lovenox  2. Pain Management: prn hydrocodone. Reporting foot pain with ambulation.  3. Mood: Offer ego support and encouragement. Will have LCSW follow up for full evaluation.   -he is admittedly anxious after this surgery  -may have a stroke related component to mood lability 4. Neuropsych: This patient is capable of making decisions on his/her own behalf.  5. DM type 2: Monitor with ac/hs checks. Resume metformin. Continue SSI for elevated BS management/wound healing.  6. CVA with spastic L-HP: Resume aggrenox.  7. HTN: monitor with bid checks. Continue lisinopril.  8.  Chronic constipation: added miralax   Resume probiotic. 9. Lipids: resume lovaza per home regimen. Dc lipitor 10. Post-op anemia:  -fe supplement  -recheck monday  LOS (Days) 1 A FACE TO FACE EVALUATION WAS PERFORMED  SWARTZ,ZACHARY T 02/01/2012, 7:13 AM

## 2012-02-01 NOTE — Progress Notes (Signed)
Pt  Continues to c/o various types pf pain; including neuropathic BLE's; c/o increased pain at buttocks (full thickness wound, see additional note) tenderness at groin,incision lines; Pam Love PAC aware, orders received. Mild confusion noted towards evening, yelling out at times, r/t  BLE discomfort, mediacted per orders, repositioned frequently; Wound at buttocks cleansed and redressed, PAC saw, new orders,consult for [facility].LBM 10/4 per pt report. GEn'l edema, scrotum sl swollen, 1+ LUE, 2+ BLE's Rt foot edematous, nodule at rt great toe, red and warm to touch. Dr Wynn Banker aware on admission,monitor. All pulses palpable,1+; 3rd toe lt foot discolored, lt foot sl. cooler than right. All incision lines C.D.I with dermabond. Carlean Purl

## 2012-02-02 ENCOUNTER — Inpatient Hospital Stay (HOSPITAL_COMMUNITY): Payer: Medicare Other | Admitting: *Deleted

## 2012-02-02 ENCOUNTER — Inpatient Hospital Stay (HOSPITAL_COMMUNITY): Payer: Medicare Other

## 2012-02-02 LAB — GLUCOSE, CAPILLARY
Glucose-Capillary: 149 mg/dL — ABNORMAL HIGH (ref 70–99)
Glucose-Capillary: 106 mg/dL — ABNORMAL HIGH (ref 70–99)

## 2012-02-02 NOTE — Progress Notes (Signed)
Subjective/Complaints: Has bilateral foot pain- chronic. Pain meds help some  Objective: Vital Signs: Blood pressure 130/70, pulse 76, temperature 98 F (36.7 C), temperature source Oral, resp. rate 17, SpO2 98.00%. No results found.  Basename 02/01/12 0630  WBC 8.1  HGB 7.9*  HCT 25.4*  PLT 257    Basename 02/01/12 0630  NA 137  K 3.5  CL 102  CO2 23  GLUCOSE 129*  BUN 9  CREATININE 0.98  CALCIUM 8.3*   CBG (last 3)   Basename 02/02/12 0746 02/01/12 2109 02/01/12 1625  GLUCAP 106* 176* 131*    Wt Readings from Last 3 Encounters:  01/23/12 176 lb 2.4 oz (79.9 kg)  01/23/12 176 lb 2.4 oz (79.9 kg)  01/23/12 176 lb 2.4 oz (79.9 kg)    Physical Exam:  Elderly male NAD Minimally slurred speech Chest CTA CV REg rate ABd- soft, + bs Ext: trace edema- pretibial  Assessment/Plan: 1. Functional deficits secondary to deconditioning after axillary-fem bpg (hx of right cva) which require 3+ hours per day of interdisciplinary therapy in a comprehensive inpatient rehab setting.  Medical Problem List and Plan:  1. DVT Prophylaxis/Anticoagulation: Pharmaceutical: Lovenox  2. Pain Management: prn hydrocodone. Reporting foot pain with ambulation.  3. Mood: Offer ego support and encouragement. Will have LCSW follow up for full evaluation.   -he is admittedly anxious after this surgery  -may have a stroke related component to mood lability 4. Neuropsych: This patient is capable of making decisions on his/her own behalf.  5. DM type 2: Monitor with ac/hs checks. Resume metformin. Continue SSI for elevated BS management/wound healing.  6. CVA with spastic L-HP: Resume aggrenox.  7. HTN: monitor with bid checks. Continue lisinopril.  BP Readings from Last 4 Encounters:  02/02/12 130/70  01/31/12 131/42  01/31/12 131/42  01/31/12 131/42  8. Chronic constipation: added miralax   On probiotic. 9. Lipids: on lovaza, apparently lipitor contributed to foot pain (per patient) 10.  Post-op anemia:  -fe supplement  -recheck monday  LOS (Days) 2 A FACE TO FACE EVALUATION WAS PERFORMED  Safwan Tomei HENRY 02/02/2012, 9:02 AM

## 2012-02-02 NOTE — Progress Notes (Signed)
Physical Therapy Note  Patient Details  Name: Donald [name] MRN: [id number] Date of Birth: [birth year] Today's Date: 02/02/2012   Pt declined 60 min PT d/t c/o constipation despite encouragement and education on benefits of mobility.   Michaelene Song 02/02/2012, 9:14 AM

## 2012-02-02 NOTE — Plan of Care (Signed)
Problem: RH SKIN INTEGRITY Goal: RH STG SKIN FREE OF INFECTION/BREAKDOWN Free from skin infection/breakdown with mod assistance  Outcome: Not Progressing Patient with full thickness wound to buttocks and external hemorrhoids.

## 2012-02-02 NOTE — Progress Notes (Addendum)
Occupational Therapy Session Note  Patient Details  Name: Donald [name] MRN: [id number] Date of Birth: 07/05/1945  Today's Date: 02/02/2012 Time: 4098-1191 Time Calculation (min): 75 min  Short Term Goals: Week 1:  OT Short Term Goal 1 (Week 1): Pt will perform bathing tasks sit<>stand Min A. OT Short Term Goal 2 (Week 1): Pt will dress LB using AE prn Mod A. OT Short Term Goal 3 (Week 1): Pt will maintain dynamic standing balance during functional activity Min A. OT Short Term Goal 4 (Week 1): Pt will perform sit<>stand for LB bathing/dressing Min A. OT Short Term Goal 5 (Week 1): Pt will perform toilet transfer Mod A.  Skilled Therapeutic Interventions/Progress Updates:  Pt in bed upon therapy arrival. Patient stated that his feet were burning but he was agreeable to participate in tx session. Patient was in a better mood this PM session apologizing for behavior earlier. New w/c was given to patient due to right break not working on previous one. Sit to stand Min A -P/ROM; LUE; 1 set; 5 reps; to decrease spaticity. -Bil UE shoulder stretches performed with large blue therapy ball; shoulder flexion/extension; shoulder adduction/abduction; 1 set; 10 reps; vc's for technique and speed; Min A to stabilize ball. -4lb hand weight; RUE; 1 set; 15 reps; all ranges; to increase strength. -AA/ROM; LUE; 1 set; 10 reps; to maintain ROM -Pt performed toe tapping and heel tapping activity in parallel bars in supported standing with Min A for balance for improved coordination and dorsiflexion of R foot/ankle and weightshift from L to R.   Therapy Documentation Precautions:  Precautions Precautions: Fall Restrictions Weight Bearing Restrictions: No Pain: Pain Assessment Pain Assessment: 0-10 Pain Score:   7 Pain Type: Neuropathic pain Pain Location: Foot Pain Orientation: Right;Left Pain Descriptors: Burning Pain Onset: On-going Pain Intervention(s): Medication (See eMAR)  See FIM  for current functional status  Therapy/Group: Individual Therapy  Limmie Patricia, OTR/L 02/02/2012, 3:07 PM

## 2012-02-02 NOTE — Progress Notes (Signed)
Occupational Therapy Session Note  Patient Details  Name: Donald [name] MRN: [id number] Date of Birth: [birth year]  Today's Date: 02/02/2012 Time: 1045-1130 Time Calculation (min): 45 min  Short Term Goals: Week 1:  OT Short Term Goal 1 (Week 1): Pt will perform bathing tasks sit<>stand Min A. OT Short Term Goal 2 (Week 1): Pt will dress LB using AE prn Mod A. OT Short Term Goal 3 (Week 1): Pt will maintain dynamic standing balance during functional activity Min A. OT Short Term Goal 4 (Week 1): Pt will perform sit<>stand for LB bathing/dressing Min A. OT Short Term Goal 5 (Week 1): Pt will perform toilet transfer Mod A.  Skilled Therapeutic Interventions/Progress Updates:  ADL re-training session performed at sink this AM per patient's request. Right off the bat, patient was very agitated and irritable. Patient complained about not getting any sleep because the patient across the hall was yelling, staff was in and out, but he still needed things down, being in bed, etc. Therapist apologized for things not being like they are at home but informed patient that if he needs something he needs to use his call light and let nursing know. Patient was very impulsive, not listening to education, standing up before therapist was ready, saying he couldn't do anything for himself, not trying to help himself. Wife states that patient is always like this and the main reason is because he hasn't been able to smoke and he's been smoking since he was 66 years old. Therapist tried to focus ADL session on ADL performance, functional transfers, standing balance, and safety awareness.  Therapy Documentation Precautions:  Precautions Precautions: Fall Restrictions Weight Bearing Restrictions: No Pain: Pain Assessment Pain Assessment: No/denies pain  See FIM for current functional status  Therapy/Group: Individual Therapy  Limmie Patricia, OTR/L 02/02/2012, 12:55 PM

## 2012-02-03 ENCOUNTER — Inpatient Hospital Stay (HOSPITAL_COMMUNITY): Payer: Medicare Other | Admitting: Physical Therapy

## 2012-02-03 LAB — GLUCOSE, CAPILLARY
Glucose-Capillary: 130 mg/dL — ABNORMAL HIGH (ref 70–99)
Glucose-Capillary: 123 mg/dL — ABNORMAL HIGH (ref 70–99)

## 2012-02-03 MED ORDER — SENNOSIDES-DOCUSATE SODIUM 8.6-50 MG PO TABS
1.0000 | ORAL_TABLET | Freq: Two times a day (BID) | ORAL | Status: DC
  Administered 2012-02-03 – 2012-02-07 (×7): 1 via ORAL
  Filled 2012-02-03 (×7): qty 1

## 2012-02-03 MED ORDER — BISACODYL 10 MG RE SUPP: 10.0000 mg | Freq: Every day | RECTAL | Status: DC | PRN

## 2012-02-03 NOTE — Progress Notes (Signed)
Subjective/Complaints: Has bilateral foot pain- chronic. Pain meds help some. States pain is much better than 2 days ago. Slept well last night  Objective: Vital Signs: Blood pressure 140/62, pulse 84, temperature 98.5 F (36.9 C), temperature source Oral, resp. rate 96, SpO2 96.00%. No results found.  Basename 02/01/12 0630  WBC 8.1  HGB 7.9*  HCT 25.4*  PLT 257    Basename 02/01/12 0630  NA 137  K 3.5  CL 102  CO2 23  GLUCOSE 129*  BUN 9  CREATININE 0.98  CALCIUM 8.3*   CBG (last 3)   Basename 02/03/12 0725 02/02/12 2040 02/02/12 1655  GLUCAP 100* 106* 120*    Wt Readings from Last 3 Encounters:  01/23/12 176 lb 2.4 oz (79.9 kg)  01/23/12 176 lb 2.4 oz (79.9 kg)  01/23/12 176 lb 2.4 oz (79.9 kg)    Physical Exam:  Elderly male NAD Minimally slurred speech Chest CTA CV REg rate ABd- soft, + bs Ext: trace edema- pretibial  Assessment/Plan: 1. Functional deficits secondary to deconditioning after axillary-fem bpg (hx of right cva) which require 3+ hours per day of interdisciplinary therapy in a comprehensive inpatient rehab setting.  Medical Problem List and Plan:  1. DVT Prophylaxis/Anticoagulation: Pharmaceutical: Lovenox  2. Pain Management: prn hydrocodone. Reporting foot pain with ambulation.  3. Mood: Offer ego support and encouragement. 4. Neuropsych: This patient is capable of making decisions on his/her own behalf.  5. DM type 2: Monitor with ac/hs checks. Resume metformin. Continue SSI for elevated BS management/wound healing.  6. CVA with spastic L-HP: Resume aggrenox.  7. HTN: monitor with bid checks. Continue lisinopril.  BP Readings from Last 4 Encounters:  02/03/12 140/62  01/31/12 131/42  01/31/12 131/42  01/31/12 131/42  8. Chronic constipation: added miralax   On probiotic.add senokot-s (02/03/2012) 9. Lipids: on lovaza, apparently lipitor contributed to foot pain (per patient) 10. Post-op anemia:  -fe supplement-may contribute to  constipation  -recheck monday  LOS (Days) 3 A FACE TO FACE EVALUATION WAS PERFORMED  SWORDS,BRUCE HENRY 02/03/2012, 9:18 AM

## 2012-02-03 NOTE — Progress Notes (Signed)
Patient complaining of constipation this morning- Patient on scheduled mirilax. Patient abdomen distended and bowel sounds- hyperactive. Dr Cato Mulligan notified. New orders received.

## 2012-02-03 NOTE — Progress Notes (Signed)
Physical Therapy Note  Patient Details  Name: Donald [name] MRN: [id number] Date of Birth: [birth year] Today's Date: 02/03/2012  0830-0900 (30 minutes) individual Pain: 8/10 RT ankle/hip/ premedicated Focus of treatment: Therapeutic activities focused on bed mobility; transfers; gait training  Treatment: Donning shorts min assist to thread pants on legs ; pt rolled side to side to donn shorts; supine to side to sit SBA using rail ; transfer SPT min assist; transfer to toilet using rail min assist ; pt able to stand with safety rail SBA for assist with hygiene; gait 35 feet X 1 bilateral PFRW min assist. Quick release belt in place on wc.    Ariel Dimitri,JIM 02/03/2012, 7:31 AM

## 2012-02-03 NOTE — Consult Note (Signed)
WOC consult Note Reason for Consult: buttocks wound Wound type:moisture associated skin damage (MASD); not pressure. Pressure Ulcer POA: No Measurement: 4cm x 2.5cm x .2cm, located on the left inner aspect of the gluteal crease Wound WUJ:WJXBJ, 50% pink, 50% thin yellow clough Drainage (amount, consistency, odor) small amount of think yellow drainage on old dressing. Periwound:intact, macerated Dressing procedure/placement/frequency: Dressings in this area are not holding well and are causing patient to sweat. He and his wife inquire today about use of a cream, perhaps an OTC ointment for diaper-rash.  We have a similar, zinc-based skin barrier cream that will both protect this area and facilitate tissue repair and recovery.  This is implemented today.  Patient and wife encouraged to leave briefs and clothing off while in bed, to avoid the back lying position in favor of side lying and are provided with a pressure redistribution chair cushion for is use when OOB in chair. I will not follow.  Please re-consult if needed. Thanks, Ladona Mow, MSN, RN, [facility], CWOCN [phone])

## 2012-02-04 ENCOUNTER — Encounter (HOSPITAL_COMMUNITY): Payer: Medicare Other | Admitting: Occupational Therapy

## 2012-02-04 ENCOUNTER — Inpatient Hospital Stay (HOSPITAL_COMMUNITY): Payer: Medicare Other

## 2012-02-04 ENCOUNTER — Inpatient Hospital Stay (HOSPITAL_COMMUNITY): Payer: Medicare Other | Admitting: Occupational Therapy

## 2012-02-04 ENCOUNTER — Inpatient Hospital Stay (HOSPITAL_COMMUNITY): Payer: Medicare Other | Admitting: Physical Therapy

## 2012-02-04 LAB — GLUCOSE, CAPILLARY: Glucose-Capillary: 90 mg/dL (ref 70–99)

## 2012-02-04 LAB — BASIC METABOLIC PANEL
BUN: 12 mg/dL (ref 6–23)
Calcium: 8.9 mg/dL (ref 8.4–10.5)
Creatinine, Ser: 0.93 mg/dL (ref 0.50–1.35)
GFR calc Af Amer: >90 mL/min (ref >90)
GFR calc non Af Amer: 84 mL/min — ABNORMAL LOW (ref >90)

## 2012-02-04 LAB — CBC
MCHC: 31.6 g/dL (ref 30.0–36.0)
RDW: 15.3 % (ref 11.5–15.5)

## 2012-02-04 SURGERY — INSERTION, ENDOVASCULAR STENT GRAFT, AORTA, ABDOMINAL
Anesthesia: General

## 2012-02-04 MED ORDER — LIDOCAINE HCL 2 % EX GEL
CUTANEOUS | Status: DC | PRN
  Administered 2012-02-04: 5
  Filled 2012-02-04: qty 5

## 2012-02-04 MED ORDER — CEPHALEXIN 500 MG PO CAPS
500.0000 mg | ORAL_CAPSULE | Freq: Three times a day (TID) | ORAL | Status: DC
  Administered 2012-02-04 – 2012-02-06 (×7): 500 mg via ORAL
  Filled 2012-02-04 (×13): qty 1

## 2012-02-04 MED ORDER — POLYETHYLENE GLYCOL 3350 17 G PO PACK
17.0000 g | PACK | Freq: Two times a day (BID) | ORAL | Status: DC
  Administered 2012-02-05 – 2012-02-06 (×3): 17 g via ORAL
  Filled 2012-02-04 (×7): qty 1

## 2012-02-04 MED ORDER — HYDROCORTISONE ACETATE 25 MG RE SUPP
25.0000 mg | Freq: Two times a day (BID) | RECTAL | Status: DC
  Administered 2012-02-04 – 2012-02-07 (×4): 25 mg via RECTAL
  Filled 2012-02-04 (×10): qty 1

## 2012-02-04 MED ORDER — NICOTINE 7 MG/24HR TD PT24
7.0000 mg | MEDICATED_PATCH | Freq: Every day | TRANSDERMAL | Status: DC
  Administered 2012-02-04 – 2012-02-07 (×4): 7 mg via TRANSDERMAL
  Filled 2012-02-04 (×5): qty 1

## 2012-02-04 MED ORDER — LIDOCAINE HCL 2 % EX GEL
Freq: Three times a day (TID) | CUTANEOUS | Status: DC
  Administered 2012-02-04 – 2012-02-05 (×3): 5
  Administered 2012-02-06: 17:00:00
  Administered 2012-02-06 – 2012-02-07 (×2): 5
  Filled 2012-02-04 (×34): qty 5

## 2012-02-04 MED ORDER — SORBITOL 70 % SOLN
45.0000 mL | Freq: Once | Status: AC
  Administered 2012-02-04: 45 mL via ORAL
  Filled 2012-02-04 (×2): qty 30

## 2012-02-04 NOTE — Progress Notes (Signed)
Physical Therapy Note  Patient Details  Name: Donald [name] MRN: [id number] Date of Birth: 30-Aug-1945 Today's Date: 02/04/2012  Time: 1430-1450 20 minutes  Pt c/o 10/10 stomach pain, RN aware.  Pt refused any OOB activity due to pain.  [name] present and PT provided education on home safety, energy conservation and use of RW in the home.  [name] expressed concern over RW fitting in tight spaces, agreed to bring in RW from home to practice with.  Pt/wife express this is their only concern other than pain control.  RN aware.  Individual therapy   Ilian Wessell 02/04/2012, 2:57 PM

## 2012-02-04 NOTE — Progress Notes (Signed)
Patient given soap suds enema at 1055 with minimal success . Patient noted with hemorrhoids disimpacted of small amount of hard stool. Blood noted in clots from rectal area . Patient only received 100cc of soap suds enema  And was unable to tolerate remainder of enema . Patient continues to report rectal pain . Dr. Riley Kill notified . Continue with plan of care .                                    Cleotilde Neer

## 2012-02-04 NOTE — Progress Notes (Signed)
This note has been reviewed and this clinician agrees with information provided.  

## 2012-02-04 NOTE — Progress Notes (Signed)
Subjective/Complaints: Complains of constipation and a lot of belly pain which affects his activity tolerance.  Objective: Vital Signs: Blood pressure 147/70, pulse 85, temperature 98.1 F (36.7 C), temperature source Oral, resp. rate 19, SpO2 97.00%. No results found.  Basename 02/04/12 0520  WBC 6.2  HGB 8.5*  HCT 26.9*  PLT 346    Basename 02/04/12 0520  NA 139  K 3.5  CL 105  CO2 26  GLUCOSE 118*  BUN 12  CREATININE 0.93  CALCIUM 8.9   CBG (last 3)   Basename 02/03/12 2027 02/03/12 1656 02/03/12 1137  GLUCAP 123* 102* 130*    Wt Readings from Last 3 Encounters:  01/23/12 79.9 kg (176 lb 2.4 oz)  01/23/12 79.9 kg (176 lb 2.4 oz)  01/23/12 79.9 kg (176 lb 2.4 oz)    Physical Exam:  Constitutional: He is oriented to person, place, and time. He appears well-developed and well-nourished.  HENT:  Head: Normocephalic.  Eyes: Pupils are equal, round, and reactive to light.  Neck: Normal range of motion.  Cardiovascular: Normal rate and regular rhythm.  Pulmonary/Chest: Effort normal and breath sounds normal.  Upper airway generally clear Abdominal: Soft. Bowel scarce, high pitched, abd slightly distended. Skin is warm and red with blanching around left incision site. Bruising also noted.  Musculoskeletal:  Right calf tender to palpation and with hamstring stretch. Negative Homans'.  Neurological: He is alert and oriented to person, place, and time.   Left facial weakness. LUE with contracture at hand and elbow.  Tone Ashworth grade 3 in the left biceps, 1-2 finger and wrist flexor as well as the shoulder adductor  Motor strength is 2 minus in the left deltoid, biceps, triceps, grip, hip flexors,3 minus knee extensors, 2 minusankle dorsiflexor plantar flexor Skin:  Incision bilateral inguinal area slightly reddened with tenderness and derma bond. Left upper chest incision clean and dry with derma bond and tender to touch. Irritated perineum.  Psychiatric:  Cognition  and memory are normal. He is a little emotional   Assessment/Plan: 1. Functional deficits secondary to deconditioning after axillary-fem bpg (hx of right cva) which require 3+ hours per day of interdisciplinary therapy in a comprehensive inpatient rehab setting. Physiatrist is providing close team supervision and 24 hour management of active medical problems listed below. Physiatrist and rehab team continue to assess barriers to discharge/monitor patient progress toward functional and medical goals. FIM: FIM - Bathing Bathing Steps Patient Completed: Chest;Left Arm;Abdomen;Front perineal area Bathing: 2: Max-Patient completes 3-4 62f 10 parts or 25-49%  FIM - Upper Body Dressing/Undressing Upper body dressing/undressing steps patient completed: Thread/unthread right sleeve of pullover shirt/dresss;Thread/unthread left sleeve of pullover shirt/dress;Put head through opening of pull over shirt/dress;Pull shirt over trunk Upper body dressing/undressing: 4: Min-Patient completed 75 plus % of tasks FIM - Lower Body Dressing/Undressing Lower body dressing/undressing steps patient completed: Thread/unthread right underwear leg;Thread/unthread left underwear leg;Pull underwear up/down;Thread/unthread right pants leg;Thread/unthread left pants leg;Pull pants up/down;Don/Doff right sock;Don/Doff left sock;Don/Doff right shoe;Don/Doff left shoe;Fasten/unfasten right shoe;Fasten/unfasten left shoe Lower body dressing/undressing: 4: Min-Patient completed 75 plus % of tasks  FIM - Toileting Toileting steps completed by patient: Performs perineal hygiene Toileting Assistive Devices: Grab bar or rail for support Toileting: 2: Max-Patient completed 1 of 3 steps  FIM - Diplomatic Services operational officer Devices: Grab bars Toilet Transfers: 4-To toilet/BSC: Min A (steadying Pt. > 75%);4-From toilet/BSC: Min A (steadying Pt. > 75%)  FIM - Bed/Chair Transfer Bed/Chair Transfer Assistive Devices: Bed  rails Bed/Chair Transfer: 4: Bed >  Chair or W/C: Min A (steadying Pt. > 75%);4: Chair or W/C > Bed: Min A (steadying Pt. > 75%)  FIM - Locomotion: Wheelchair Distance: pt requires cues for use of LEs for propulsion, assist for steering Locomotion: Wheelchair: 1: Travels less than 50 ft with moderate assistance (Pt: 50 - 74%) FIM - Locomotion: Ambulation Ambulation/Gait Assistance: 2: Max assist Locomotion: Ambulation: 1: Travels less than 50 ft with maximal assistance (Pt: 25 - 49%)  Comprehension Comprehension Mode: Auditory Comprehension: 5-Follows basic conversation/direction: With no assist  Expression Expression Mode: Verbal Expression: 5-Expresses basic needs/ideas: With no assist  Social Interaction Social Interaction: 6-Interacts appropriately with others with medication or extra time (anti-anxiety, antidepressant).  Problem Solving Problem Solving: 5-Solves basic problems: With no assist  Memory Memory: 3-Recognizes or recalls 50 - 74% of the time/requires cueing 25 - 49% of the time  Medical Problem List and Plan:  1. DVT Prophylaxis/Anticoagulation: Pharmaceutical: Lovenox  2. Pain Management: prn hydrocodone. Reporting foot pain with ambulation.   -abdominal pain- advanced bowel regimen, enema--uses allign at home  -rx cellulitis with keflex 3. Mood: Offer ego support and encouragement. Will have LCSW follow up for full evaluation.   -he is admittedly anxious after this surgery  -may have a stroke related component to mood lability 4. Neuropsych: This patient is capable of making decisions on his/her own behalf.  5. DM type 2: Monitor with ac/hs checks. Resume metformin. Continue SSI for elevated BS management/wound healing.  6. CVA with spastic L-HP: Resume aggrenox.  7. HTN: monitor with bid checks. Continue lisinopril.  8. Chronic constipation: added miralax   Resumed probiotic. Enema as above 9. Lipids: resume lovaza per home regimen. Dc lipitor 10. Post-op  anemia:  -fe supplement  -recheck hgb improved to 8.5 today   LOS (Days) 4 A FACE TO FACE EVALUATION WAS PERFORMED  SWARTZ,ZACHARY T 02/04/2012, 6:30 AM

## 2012-02-04 NOTE — Progress Notes (Signed)
Occupational Therapy Session Note  Patient Details  Name: Donald [name] MRN: [id number] Date of Birth: Jan 09, 1946  Today's Date: 02/04/2012 Time: 1100-1145 Time Calculation (min): 45 min Missed minutes: 15 min  Short Term Goals: Week 1:  OT Short Term Goal 1 (Week 1): Pt will perform bathing tasks sit<>stand Min A. OT Short Term Goal 2 (Week 1): Pt will dress LB using AE prn Mod A. OT Short Term Goal 3 (Week 1): Pt will maintain dynamic standing balance during functional activity Min A. OT Short Term Goal 4 (Week 1): Pt will perform sit<>stand for LB bathing/dressing Min A. OT Short Term Goal 5 (Week 1): Pt will perform toilet transfer Mod A.  Skilled Therapeutic Interventions/Progress Updates:    Pt seated on toilet with nursing staff for enema; pt stated he was "not going to be able to do any therapy" due to pain from constipation, agreed to participate from bed level. Performed sit to stand and clothing management and transferred toilet to w/c Min A with grab bar; participated in grooming at sink level with set-up; transferred from w/c to bed Min A and transitioned to side lying right side for decreased pressure on buttocks. Performed PROM and AAROM LUE shld flex/ext, abd/add, elbow flex/ext, supination pronation, wrist and finger flex/ext for joint integrity/ROM. Educated pt on self ROM and weight bearing techniques for L hand/fingers.  Performed ankle pumps x15 reps from supine with focus on dorsiflexion of R ankle for improved heel strike with functional ambulation.  Therapy Documentation Precautions:  Precautions Precautions: Fall Restrictions Weight Bearing Restrictions: No   See FIM for current functional status  Therapy/Group: Individual Therapy  Jackelyn Poling 02/04/2012, 12:00 PM

## 2012-02-04 NOTE — Progress Notes (Signed)
Physical Therapy Note  Patient Details  Name: Donald [name] MRN: [id number] Date of Birth: 1945/11/07 Today's Date: 02/04/2012  Time: 900-955 55 minutes  Pt c/o burning pain in B feet, constipation pain.  RN made aware.  Gait training with PFRW on carpet, around obstacles with min A.  Pt gait with narrow BOS, cues to widen stance.  Pt with increased pain in B LEs after >100' of gait, requires seated rest.  Stair training with 1 handrail with min A.  Standing balance training tap ups with min A with 1 UE support, mod-max A without UE support.  Pt with increased pain in stomach with tap ups, RN made aware.  Assisted pt to toilet with min A for transfers and balance, RN to administer suppository.  Individual therapy   [name] 02/04/2012, 9:53 AM

## 2012-02-04 NOTE — Progress Notes (Signed)
Occupational Therapy Session Note  Patient Details  Name: Donald [name] MRN: [id number] Date of Birth: [birth year]  Today's Date: 02/04/2012 Time:  1330- 1415    Short Term Goals: Week 1:  OT Short Term Goal 1 (Week 1): Pt will perform bathing tasks sit<>stand Min A. OT Short Term Goal 2 (Week 1): Pt will dress LB using AE prn Mod A. OT Short Term Goal 3 (Week 1): Pt will maintain dynamic standing balance during functional activity Min A. OT Short Term Goal 4 (Week 1): Pt will perform sit<>stand for LB bathing/dressing Min A. OT Short Term Goal 5 (Week 1): Pt will perform toilet transfer Mod A.  Skilled Therapeutic Interventions/Progress Updates:    Pt willing to participate in session despite continued discomfort with buttocks area/bowels that nursing staff aware of. Self care retraining session focusing on safe functional mobility and transfers in household environment with PFRW and bed mobility in ADL apartment bed.  Pt ambulated ~20 ft Min A before c/o pain in legs and needing to sit down; pain 7/10 with activity and decreases with rest. Performed walk-in shower transfer to shower chair to simulate home environment using PFRW with Min A; negotiated kitchen environment and obtained specific items Min A. Pt required many rest breaks due to pain in legs and bottom of feet. Needed Mod verbal cues for safety when donning/doffing strap for platform for L UE and to reach back for surface he is sitting down on. Wife present for entire session.  Therapy Documentation Precautions:  Precautions Precautions: Fall Restrictions Weight Bearing Restrictions: No   See FIM for current functional status  Therapy/Group: Individual Therapy  Jackelyn Poling 02/04/2012, 2:28 PM

## 2012-02-04 NOTE — Progress Notes (Signed)
Social Work  Social Work Assessment and Plan  Patient Details  Name: Donald [name] MRN: [id number] Date of Birth: [birth year]  Today's Date: 02/04/2012  Problem List:  Patient Active Problem List  Diagnosis  . SEC MALIGNANT NEOPLASM OF LARGE INTESTINE&RECTUM  . DM  . HYPERLIPIDEMIA  . HEMORRHOIDS  . CONSTIPATION  . CONSTIPATION, CHRONIC  . RECTAL BLEEDING  . NAUSEA AND VOMITING  . FLATULENCE-GAS-BLOATING  . RECTAL MASS  . ABDOMINAL PAIN -GENERALIZED  . CEREBROVASCULAR ACCIDENT, HX OF  . Anal cancer  . Anal carcinoma  . Atherosclerotic peripheral vascular disease with rest pain  . Preoperative evaluation to rule out surgical contraindication  . Physical deconditioning   Past Medical History:  Past Medical History  Diagnosis Date  . GERD (gastroesophageal reflux disease)   . Hyperlipidemia   . Hypertension   . Peripheral vascular disease   . Type II diabetes mellitus   . Pneumonia   . Internal and external bleeding hemorrhoids   . Stroke 2008    "left side weak; unable to move left hand still" (01/23/2012)  . History of gout     left great toe  . anal ca dx'd 08/2007    S/P chemo, radiation, biopsies  . Metastases to the liver dx'd 08/2010   Past Surgical History:  Past Surgical History  Procedure Date  . Carotid endarterectomy 2008    right  . Liver cryoablation 08/2010    "chemo shrunk the cancer 50% then they went in and burned the rest of it"  . Mass biopsy     rectal; "found cancer"  . Axillary-femoral bypass graft 01/29/2012    Procedure: BYPASS GRAFT AXILLA-BIFEMORAL;  Surgeon: Sherren Kerns, MD;  Location: [facility] OR;  Service: Vascular;  Laterality: N/A;  Left axilla-bifemoral bypass using gore-tex graft.    Social History:  reports that he has been smoking Cigarettes.  He has a 100 pack-year smoking history. He has never used smokeless tobacco. He reports that he uses illicit drugs (Marijuana). He reports that he does not drink alcohol.  Family /  Support Systems Marital Status: Married Patient Roles: Spouse;Parent Children: none Other Supports: none Anticipated Caregiver: Wife Ability/Limitations of Caregiver: none Caregiver Availability: 24/7 (Wife has provided care since pt had CVA.) Family Dynamics: Wife at bedside and appears very attentive, however, does complain of having "bad knees".    Social History Preferred language: English Religion: Baptist Cultural Background: NA Education: college Read: Yes Write: Yes Employment Status: Retired Fish farm manager Issues: none Guardian/Conservator: none   Abuse/Neglect Physical Abuse: Denies Verbal Abuse: Denies Sexual Abuse: Denies Exploitation of patient/patient's resources: Denies Self-Neglect: Denies  Emotional Status Pt's affect, behavior adn adjustment status: Pt lying in bed and c/o much bowel discomfort, however, ageeable with interview - "let's get it over" - Flat affect throughout. Answers questions appropriately and denies any s/s of depression or anxiety.  Reports much frustration over discomfort he has been experiencing with bowels all day.  Will monitor emotional adjustment as her progresses through the program. Recent Psychosocial Issues: None significant (CVA 2008) Pyschiatric History: None Substance Abuse History: None  Patient / Family Perceptions, Expectations & Goals Pt/Family understanding of illness & functional limitations: pt and wife with good understanding of pt's medical course, diagnoses and need for FPBG bilaterally.  Good awareness of current functional limitations as well. Premorbid pt/family roles/activities: Pt describes fairly active lifestyle PTA.  Was getting out of thehouse daily.  Wife and patient sharing in household responsibilities. Anticipated changes in  roles/activities/participation: Wife may need to provide some increased physical assistance, dependent on gains that are made. Pt/family expectations/goals: Pt's sole focus  currently is in correcting his bowel issues and then he feels he can "focus on the therapy"  [employer]: None Premorbid Home Care/DME Agencies: Other (Comment) (attended OP tx at [facility] in 2008) Transportation available at discharge: yes  Discharge Planning Living Arrangements: Spouse/significant other Support Systems: Spouse/significant other Type of Residence: Private residence Insurance Resources: Administrator (specify) Building services engineer) Financial Resources: Restaurant manager, fast food Screen Referred: No Living Expenses: Own Money Management: Patient Do you have any problems obtaining your medications?: No Home Management: shared between pt and wife Patient/Family Preliminary Plans: Pt plans to return home with his wife as sole support. Social Work Anticipated Follow Up Needs: HH/OP Expected length of stay: 1-2 weeks  Clinical Impression Oriented gentleman here following bil. FPBG procedures, but currently with bowel issues and very uncomfortable. Reluctantly agreeable to this interview.  Wife and bedside and very attentive.  Pt feels he cannot currently "focus" on therapy until bowel issues resolved.  No formal depression screen administered and does not appear appropriate, but will monitor throughout his CIR stay.  Alverto Shedd 02/04/2012, 4:18 PM

## 2012-02-05 ENCOUNTER — Encounter (HOSPITAL_COMMUNITY): Payer: Medicare Other | Admitting: Occupational Therapy

## 2012-02-05 ENCOUNTER — Inpatient Hospital Stay (HOSPITAL_COMMUNITY): Payer: Medicare Other | Admitting: *Deleted

## 2012-02-05 ENCOUNTER — Inpatient Hospital Stay (HOSPITAL_COMMUNITY): Payer: Medicare Other | Admitting: Physical Therapy

## 2012-02-05 LAB — GLUCOSE, CAPILLARY
Glucose-Capillary: 134 mg/dL — ABNORMAL HIGH (ref 70–99)
Glucose-Capillary: 151 mg/dL — ABNORMAL HIGH (ref 70–99)
Glucose-Capillary: 131 mg/dL — ABNORMAL HIGH (ref 70–99)

## 2012-02-05 MED ORDER — SODIUM CHLORIDE 0.9 % IJ SOLN
10.0000 mL | INTRAMUSCULAR | Status: DC | PRN
  Administered 2012-02-05: 20 mL
  Administered 2012-02-05 – 2012-02-07 (×3): 10 mL

## 2012-02-05 MED ORDER — SODIUM CHLORIDE 0.9 % IJ SOLN
10.0000 mL | Freq: Two times a day (BID) | INTRAMUSCULAR | Status: DC
  Administered 2012-02-06: 10 mL

## 2012-02-05 MED ORDER — GABAPENTIN 100 MG PO CAPS
200.0000 mg | ORAL_CAPSULE | Freq: Three times a day (TID) | ORAL | Status: DC
  Administered 2012-02-05 – 2012-02-06 (×4): 200 mg via ORAL
  Filled 2012-02-05 (×8): qty 2

## 2012-02-05 MED ORDER — SORBITOL 70 % SOLN
960.0000 mL | TOPICAL_OIL | Freq: Once | ORAL | Status: AC
  Administered 2012-02-05: 960 mL via RECTAL
  Filled 2012-02-05 (×2): qty 240

## 2012-02-05 NOTE — Progress Notes (Signed)
Orthopedic Tech Progress Note Patient Details:  Donald [name] 06-17-1945 [id number]  Patient ID: [name], male   DOB: Feb 10, 1946, 67 y.o.   MRN: [id number] Called advanced brace order@1805 ;spoke with vendor Frankey Poot, Letesha Klecker 02/05/2012, 6:02 PM

## 2012-02-05 NOTE — Progress Notes (Signed)
Pt appears much stronger  Left foot still with dusky toes one and two but stable Right foot pink and warm and looks good  Right groin incision healing  Left axillary tunnel and groin healing but some ecchymosis and tenderness, chest incision healing  Overall improving will schedule follow up after Rehab  Fabienne Bruns, MD [facility] Office: [phone] Pager: [phone]

## 2012-02-05 NOTE — Progress Notes (Signed)
Subjective/Complaints: Still no bm. Abdominal pain remains limiting.  Objective: Vital Signs: Blood pressure 125/43, pulse 78, temperature 98.4 F (36.9 C), temperature source Oral, resp. rate 19, SpO2 95.00%. Dg Abd 1 View  02/04/2012  *RADIOLOGY REPORT*  Clinical Data: Lower abdominal pain and distention.  Constipation. Rule out ileus.  ABDOMEN - 1 VIEW  Comparison: 10/12/2011 CT.  06/13/2011 PET.  Findings: Single supine view of the abdomen and pelvis. Cholelithiasis.  No bowel distention. Distal gas and stool.  No pneumatosis or free intraperitoneal air.  IMPRESSION: Cholelithiasis, but no evidence of acute finding in the abdomen or pelvis.   Original Report Authenticated By: Consuello Bossier, M.D.     Basename 02/04/12 0520  WBC 6.2  HGB 8.5*  HCT 26.9*  PLT 346    Basename 02/04/12 0520  NA 139  K 3.5  CL 105  CO2 26  GLUCOSE 118*  BUN 12  CREATININE 0.93  CALCIUM 8.9   CBG (last 3)   Basename 02/05/12 0715 02/04/12 2051 02/04/12 1634  GLUCAP 134* 123* 90    Wt Readings from Last 3 Encounters:  01/23/12 79.9 kg (176 lb 2.4 oz)  01/23/12 79.9 kg (176 lb 2.4 oz)  01/23/12 79.9 kg (176 lb 2.4 oz)    Physical Exam:  Constitutional: He is oriented to person, place, and time. He appears well-developed and well-nourished.  HENT:  Head: Normocephalic.  Eyes: Pupils are equal, round, and reactive to light.  Neck: Normal range of motion.  Cardiovascular: Normal rate and regular rhythm.  Pulmonary/Chest: Effort normal and breath sounds normal.  Upper airway generally clear Abdominal: Soft. Bowel scarce, high pitched, abd slightly distended. Skin is warm and red with blanching around left incision site. Bruising also noted.  Musculoskeletal:  Right calf tender to palpation and with hamstring stretch. Negative Homans'.  Neurological: He is alert and oriented to person, place, and time.   Left facial weakness. LUE with contracture at hand and elbow.  Tone Ashworth grade 3  in the left biceps, 1-2 finger and wrist flexor as well as the shoulder adductor  Motor strength is 2 minus in the left deltoid, biceps, triceps, grip, hip flexors,3 minus knee extensors, 2 minusankle dorsiflexor plantar flexor Skin:  Incision bilateral inguinal area slightly reddened with tenderness and derma bond. Left upper chest incision clean and dry with derma bond and tender to touch. Irritated perineum.  Psychiatric:  Cognition and memory are normal. He is a little emotional   Assessment/Plan: 1. Functional deficits secondary to deconditioning after axillary-fem bpg (hx of right cva) which require 3+ hours per day of interdisciplinary therapy in a comprehensive inpatient rehab setting. Physiatrist is providing close team supervision and 24 hour management of active medical problems listed below. Physiatrist and rehab team continue to assess barriers to discharge/monitor patient progress toward functional and medical goals. FIM: FIM - Bathing Bathing Steps Patient Completed: Chest;Left Arm;Abdomen;Front perineal area Bathing: 2: Max-Patient completes 3-4 77f 10 parts or 25-49%  FIM - Upper Body Dressing/Undressing Upper body dressing/undressing steps patient completed: Thread/unthread right sleeve of pullover shirt/dresss;Thread/unthread left sleeve of pullover shirt/dress;Put head through opening of pull over shirt/dress;Pull shirt over trunk Upper body dressing/undressing: 4: Min-Patient completed 75 plus % of tasks FIM - Lower Body Dressing/Undressing Lower body dressing/undressing steps patient completed: Thread/unthread right underwear leg;Thread/unthread left underwear leg;Pull underwear up/down;Thread/unthread right pants leg;Thread/unthread left pants leg;Pull pants up/down;Don/Doff right sock;Don/Doff left sock;Don/Doff right shoe;Don/Doff left shoe;Fasten/unfasten right shoe;Fasten/unfasten left shoe Lower body dressing/undressing: 4: Min-Patient completed 75  plus % of  tasks  FIM - Toileting Toileting steps completed by patient: Performs perineal hygiene Toileting Assistive Devices: Grab bar or rail for support Toileting: 2: Max-Patient completed 1 of 3 steps  FIM - Diplomatic Services operational officer Devices: Grab bars Toilet Transfers: 4-To toilet/BSC: Min A (steadying Pt. > 75%)  FIM - Bed/Chair Transfer Bed/Chair Transfer Assistive Devices: Bed rails Bed/Chair Transfer: 3: Supine > Sit: Mod A (lifting assist/Pt. 50-74%/lift 2 legs;4: Bed > Chair or W/C: Min A (steadying Pt. > 75%);4: Chair or W/C > Bed: Min A (steadying Pt. > 75%)  FIM - Locomotion: Wheelchair Distance: pt requires cues for use of LEs for propulsion, assist for steering Locomotion: Wheelchair: 5: Travels 150 ft or more: maneuvers on rugs and over door sills with supervision, cueing or coaxing FIM - Locomotion: Ambulation Ambulation/Gait Assistance: 2: Max assist Locomotion: Ambulation: 2: Travels 50 - 149 ft with minimal assistance (Pt.>75%)  Comprehension Comprehension Mode: Auditory Comprehension: 5-Follows basic conversation/direction: With no assist  Expression Expression Mode: Verbal Expression: 5-Expresses basic needs/ideas: With no assist  Social Interaction Social Interaction: 6-Interacts appropriately with others with medication or extra time (anti-anxiety, antidepressant).  Problem Solving Problem Solving: 5-Solves basic problems: With no assist  Memory Memory: 3-Recognizes or recalls 50 - 74% of the time/requires cueing 25 - 49% of the time  Medical Problem List and Plan:  1. DVT Prophylaxis/Anticoagulation: Pharmaceutical: Lovenox  2. Pain Management: prn hydrocodone.   -abdominal pain- advanced bowel regimen, enema--uses allign at home  -rx cellulitis with keflex  -there is post-op, bruising component to pain complaints 3. Mood: Offer ego support and encouragement. Will have LCSW follow up for full evaluation.   -he is admittedly anxious after  this surgery  -may have a stroke related component to mood lability 4. Neuropsych: This patient is capable of making decisions on his/her own behalf.  5. DM type 2: Monitor with ac/hs checks. Resume metformin. Continue SSI for elevated BS management/wound healing.  6. CVA with spastic L-HP: Resume aggrenox.  7. HTN: monitor with bid checks. Continue lisinopril.  8. Chronic constipation: added miralax   Resumed probiotic. SSE today 9. Lipids: resume lovaza per home regimen. Dc lipitor 10. Post-op anemia:  -fe supplement  -recheck hgb improved to 8.5 today   LOS (Days) 5 A FACE TO FACE EVALUATION WAS PERFORMED  Kandie Keiper T 02/05/2012, 7:39 AM

## 2012-02-05 NOTE — Progress Notes (Signed)
Physical Therapy Note  Patient Details  Name: Donald [name] MRN: [id number] Date of Birth: [birth year] Today's Date: 02/05/2012  Time: 1100-1154 54 minutes  Pt c/o feet "burning" with increased gait, standing.  Eased with rest.  Treatment focused on pt/family ed with pt's wife.  Pt/wife safely demo'd at supervision level car transfer, RW mobility in home and controlled environments, bed mobility, furniture transfers, bathroom mobility/transfers.  Up/down curb step to simulate home entry with min A from wife with RW.  Pt attempted nu-step for strength and endurance but was limited to <2 minutes due to pain on bottom.  Pt performed prolonged gait for LE strength and endurance.  Individual therapy   DONAWERTH,KAREN 02/05/2012, 12:19 PM

## 2012-02-05 NOTE — Patient Care Conference (Signed)
Inpatient RehabilitationTeam Conference Note Date: 02/05/2012   Time: 3:15 PM    Patient Name: Donald [name]      Medical Record Number: [id number]  Date of Birth: 1945-10-06 Sex: Male         Room/Bed: 4004/4004-01 Payor Info: Payor: MEDICARE  Plan: MEDICARE PART A AND B  Product Type: *No Product type*     Admitting Diagnosis: AX POP BPG  Admit Date/Time:  01/31/2012  3:58 PM Admission Comments: No comment available   Primary Diagnosis:  Physical deconditioning Principal Problem: Physical deconditioning  Patient Active Problem List   Diagnosis Date Noted  . Physical deconditioning 01/31/2012  . Atherosclerotic peripheral vascular disease with rest pain 01/24/2012  . Preoperative evaluation to rule out surgical contraindication 01/24/2012  . Anal carcinoma 10/30/2011  . Anal cancer 02/26/2011  . SEC MALIGNANT NEOPLASM OF LARGE INTESTINE&RECTUM 11/09/2008  . NAUSEA AND VOMITING 11/09/2008  . RECTAL MASS 08/30/2008  . CONSTIPATION 08/18/2008  . RECTAL BLEEDING 08/18/2008  . FLATULENCE-GAS-BLOATING 08/18/2008  . ABDOMINAL PAIN -GENERALIZED 08/18/2008  . DM 08/17/2008  . HYPERLIPIDEMIA 08/17/2008  . HEMORRHOIDS 08/17/2008  . CONSTIPATION, CHRONIC 08/17/2008  . CEREBROVASCULAR ACCIDENT, HX OF 08/17/2008    Expected Discharge Date: Expected Discharge Date: 02/07/12  Team Members Present: Physician: Dr. Faith Rogue Nurse Present: Carmie End, RN PT Present: Reggy Eye, PT OT Present: Roney Mans, Loistine Chance, OT Other (Discipline and Name): Tora Duck, PPS Coordinator     Current Status/Progress Goal Weekly Team Focus  Medical   acute on chroniic constipation, pain from graft, bruising,   pain control  bowel mgt, foot drop mgt   Bowel/Bladder   continent bladder constipaion required soad suds enema hemorroids    min assist   decrease constiparion     Swallow/Nutrition/ Hydration             ADL's   Min A mobility; Min-Mod ADLs  Mod I toileting,  grooming, UB dressing; Min A bathing, LB dressing  family education, functional mobility with PFRW, shower transfer, standing balance, activity tolerance   Mobility   supervision   supervision  family ed, balance   Communication             Safety/Cognition/ Behavioral Observations            Pain   bilateral leg pain , low abdominal pain  left side pain  scheduled oxycodone 10mg  cr oxycodone every 4 hours  10mg    less than 2   monitor effectiveness of pain meds    Skin   left forearm skin tear  buttocks red full thickness area to rectum and buttocks area.  no new breakdown   continue  barrier cream to buttocks nystatin powder to groin bilaterally     Rehab Goals Patient on target to meet rehab goals: Yes *See Interdisciplinary Assessment and Plan and progress notes for long and short-term goals  Barriers to Discharge: pain mgt, behavioral, bowel regimen    Possible Resolutions to Barriers:  rx pain, bowels, adaptive device    Discharge Planning/Teaching Needs:  Pt plans to d/c home with wife who is avaiable and able to provide 24/7 assistance.      Team Discussion:  Doing better today and family education begun.  Anticipate d/c 10/10  Revisions to Treatment Plan:  None   Continued Need for Acute Rehabilitation Level of Care: The patient requires daily medical management by a physician with specialized training in physical medicine and rehabilitation for the following conditions: Daily direction  of a multidisciplinary physical rehabilitation program to ensure safe treatment while eliciting the highest outcome that is of practical value to the patient.: Yes Daily medical management of patient stability for increased activity during participation in an intensive rehabilitation regime.: Yes Daily analysis of laboratory values and/or radiology reports with any subsequent need for medication adjustment of medical intervention for : Post surgical problems;Neurological  problems;Other  Raisa Ditto 02/05/2012, 4:14 PM

## 2012-02-05 NOTE — Progress Notes (Signed)
Occupational Therapy Session Note  Patient Details  Name: Donald [name] MRN: [id number] Date of Birth: 12/19/1945  Today's Date: 02/05/2012 Time: 0930-1015 Time Calculation (min): 45 min  Short Term Goals: Week 1:  OT Short Term Goal 1 (Week 1): Pt will perform bathing tasks sit<>stand Min A. OT Short Term Goal 2 (Week 1): Pt will dress LB using AE prn Mod A. OT Short Term Goal 3 (Week 1): Pt will maintain dynamic standing balance during functional activity Min A. OT Short Term Goal 4 (Week 1): Pt will perform sit<>stand for LB bathing/dressing Min A. OT Short Term Goal 5 (Week 1): Pt will perform toilet transfer Mod A.  Skilled Therapeutic Interventions/Progress Updates:    1:1 self care retraining at shower level focus on sit to stands, standing balance, functional ambulation with RW with hand splint. Pt needed steadying with functional ambulation to and from bathroom, continues to loose balance due to "burning" in feet. Pt reported having a BM this morning and reports he feels better now. Pt still get frustrated with LB dressing if thread wrong leg hole- with encouragement able to self correct.  Therapy Documentation Precautions:  Precautions Precautions: Fall Restrictions Weight Bearing Restrictions: No Pain:  "buring in bilateral feet" 3/10 RN aware  See FIM for current functional status  Therapy/Group: Individual Therapy  Roney Mans [facility] 02/05/2012, 9:57 AM

## 2012-02-05 NOTE — Plan of Care (Signed)
Problem: RH PAIN MANAGEMENT Goal: RH STG PAIN MANAGED AT OR BELOW PT'S PAIN GOAL <3  Outcome: Not Progressing Pain scale at 5 or higher.

## 2012-02-05 NOTE — Progress Notes (Signed)
Physical Therapy Note  Patient Details  Name: Donald [name] MRN: [id number] Date of Birth: Sep 17, 1945 Today's Date: 02/05/2012  Time: 1415-1445 30 minutes  Pt c/o pain in bottom, RN aware.  Treatment focused on gait training with R toe off AFO.  Pt reports improved feeling of stability with AFO.  Continues to require supervision for gait with RW with AFO.  Performed household and controlled environment gait with supervision with AFO.  Curb step multiple attempts with min A, cues for safety.  Individual therapy   DONAWERTH,KAREN 02/05/2012, 2:46 PM

## 2012-02-05 NOTE — Progress Notes (Signed)
Physical Therapy Session Note  Patient Details  Name: Donald [name] MRN: [id number] Date of Birth: [birth year]  Today's Date: 02/05/2012 Time: 0800-0853, stopped 7 min early d/t pt c/o dizziness- RN aware and relating symptoms to anxiety Short Term Goals:  Week 1:  PT Short Term Goal 1 (Week 1): Pt will perform functional transfers with supervision PT Short Term Goal 2 (Week 1): Pt will gait in home environment with supervision 25' PT Short Term Goal 3 (Week 1): Pt will demo dynamic standing balance for functional task with supervision  Skilled Therapeutic Interventions/Progress Updates:    gait training- pt initially walked with L PRFR demonstrating steppage gait with RLE d/t DF weakness and 1LOB requiring A to correct ; as he walked with cane prior he wanted to try- used SBCQ with min steady A and steppage gait  Changed device to RW with L grip which pt liked best and put toe off in R shoe to aid DF in swing, 80 ft with cues for safety with sidestepping close S to min A and once pt cued to hit heel on R first, no longer with steppage gait (would try toe up but could not find for trial)    Therapeutic activity- toileting/balance training- with rectal discomfort pt becomes increasingly impulsive and unsafe despite cues; sores on inner buttocks from chem/radiation per nurse  With anxiety related to pain, pt unable to manage his own clothing and stood several times from chair with brakes unlocked.  Once pt calmer, he was able to pull pants down and maintain standing balance.  Pt with multiple c/o thruout therapy, RN aware, first burning in feet, then R calf discomfort, then rectal pain, then hot and sweaty cumulating in dizziness where he stated he did not feel safe to walk.  HR max of 124 with activity Therapy Documentation Precautions:  Precautions Precautions: Fall Restrictions Weight Bearing Restrictions: No General: Amount of Missed PT Time (min): 7 Minutes Missed Time Reason:   (pt c/o dizziness, RN aware)  See FIM for current functional status  Therapy/Group: Individual Therapy  Michaelene Song 02/05/2012, 12:19 PM

## 2012-02-06 ENCOUNTER — Inpatient Hospital Stay (HOSPITAL_COMMUNITY): Payer: Medicare Other | Admitting: Occupational Therapy

## 2012-02-06 ENCOUNTER — Inpatient Hospital Stay (HOSPITAL_COMMUNITY): Payer: Medicare Other | Admitting: Physical Therapy

## 2012-02-06 LAB — GLUCOSE, CAPILLARY
Glucose-Capillary: 125 mg/dL — ABNORMAL HIGH (ref 70–99)
Glucose-Capillary: 95 mg/dL (ref 70–99)
Glucose-Capillary: 151 mg/dL — ABNORMAL HIGH (ref 70–99)

## 2012-02-06 NOTE — Progress Notes (Signed)
Occupational Therapy Session Note  Patient Details  Name: Donald [name] MRN: [id number] Date of Birth: 1945-05-20  Today's Date: 02/06/2012 Time: 1191-4782 Time Calculation (min): 45 min  Short Term Goals: Week 1:  OT Short Term Goal 1 (Week 1): Pt will perform bathing tasks sit<>stand Min A. OT Short Term Goal 2 (Week 1): Pt will dress LB using AE prn Mod A. OT Short Term Goal 3 (Week 1): Pt will maintain dynamic standing balance during functional activity Min A. OT Short Term Goal 4 (Week 1): Pt will perform sit<>stand for LB bathing/dressing Min A. OT Short Term Goal 5 (Week 1): Pt will perform toilet transfer Mod A.  Skilled Therapeutic Interventions/Progress Updates:    GRAD - DAY Self care retraining with bathing at shower level and dressing with focus on safe functional mobility and standing balance. Pt S for functional ambulation with cues to slow down. Pt had one significant LOB to L side when turning due to fast pace. [name] present for entire session and therapist provided education on pt's need for S/cues during all functional ambulation and shower transfer. Dressed EOB and practiced donning R shoe with AFO with Min verbal cues.  Therapy Documentation Precautions:  Precautions Precautions: Fall Restrictions Weight Bearing Restrictions: No Pain:  c/o pain on bottom of feet and big toe of R foot, RN called in and made aware See FIM for current functional status  Therapy/Group: Individual Therapy  Jackelyn Poling 02/06/2012, 10:42 AM

## 2012-02-06 NOTE — Progress Notes (Signed)
Physical Therapy Discharge Summary  Patient Details  Name: Donald [name] MRN: [id number] Date of Birth: 1946-03-08  Today's Date: 02/06/2012  Patient has met 6 of 7 long term goals due to improved activity tolerance, improved balance, improved postural control, increased strength, ability to compensate for deficits and improved coordination.  Pt with limitations due to previous CVA and continued R LE weakness post surgery.  Pt is close supervision for gait with RW and R AFO and is mod I for transfers.  Patient to discharge at an ambulatory level Supervision.   Patient's care partner is independent to provide the necessary physical and cognitive assistance at discharge.  Reasons goals not met: pt requires min A for stair climbing due to impaired balance and strength  Recommendation:  Patient will benefit from ongoing skilled PT services in home health setting to continue to advance safe functional mobility, address ongoing impairments in balance, strength, gait, mobility, and minimize fall risk.  Equipment: R toe off AFO, pt owns RW  Reasons for discharge: treatment goals met and discharge from hospital  Patient/family agrees with progress made and goals achieved: Yes  PT Discharge Cognition Overall Cognitive Status: Appears within functional limits for tasks assessed Arousal/Alertness: Awake/alert Orientation Level: Oriented X4 Behaviors: Impulsive Safety/Judgment: Impaired Sensation Sensation Light Touch: Impaired Detail Light Touch Impaired Details: Impaired LLE;Impaired RLE;Impaired LUE Proprioception: Impaired by gross assessment Proprioception Impaired Details: Impaired RLE;Impaired LLE;Impaired LUE Additional Comments: continues with "burning" in B feet Coordination Gross Motor Movements are Fluid and Coordinated: No (mild ataxia) Motor  Motor Motor: Hemiplegia;Abnormal postural alignment and control;Ataxia Motor - Skilled Clinical Observations: L side hemiplegia  UE>LE from previous stroke Motor - Discharge Observations: L sided hemiplegia from previous CVA   Trunk/Postural Assessment  Cervical Assessment Cervical Assessment: Within Functional Limits Thoracic Assessment Thoracic Assessment: Within Functional Limits Lumbar Assessment Lumbar Assessment:  (rounded posture) Postural Control Postural Control:  (pelvis fixed)  Balance Static Sitting Balance Static Sitting - Balance Support: Feet supported Static Sitting - Level of Assistance: 6: Modified independent (Device/Increase time) Static Standing Balance Static Standing - Balance Support: During functional activity Static Standing - Level of Assistance: 6: Modified independent (Device/Increase time) Dynamic Standing Balance Dynamic Standing - Level of Assistance: 5: Stand by assistance Extremity Assessment  RUE Assessment RUE Assessment: Within Functional Limits LUE Assessment LUE Assessment: Exceptions to [facility] (L UE hemiplegia from previous stroke) RLE Assessment RLE Assessment:  (hip and knee 3/5, PF 3/5, DF 0/5) LLE Assessment LLE Assessment:  (grossly 3-/5)  See FIM for current functional status  [name] 02/06/2012, 1:57 PM

## 2012-02-06 NOTE — Progress Notes (Signed)
Social Work Patient ID: Donald [name], male   DOB: 1945-08-03, 66 y.o.   MRN: [id number]  Have reviewed team conference with pt and wife and both aware and agreeable with targeted d/c tomorrow at supervision goals.  Pt reports feeling much better and ready for d/c.  Team recommends HH f/u - pt agreeable - will arrange.  Andres Vest

## 2012-02-06 NOTE — Progress Notes (Signed)
Subjective/Complaints: Finally had bm yesterday. Feet hurt.   Objective: Vital Signs: Blood pressure 142/47, pulse 80, temperature 98.4 F (36.9 C), temperature source Oral, resp. rate 16, SpO2 100.00%. Dg Abd 1 View  02/04/2012  *RADIOLOGY REPORT*  Clinical Data: Lower abdominal pain and distention.  Constipation. Rule out ileus.  ABDOMEN - 1 VIEW  Comparison: 10/12/2011 CT.  06/13/2011 PET.  Findings: Single supine view of the abdomen and pelvis. Cholelithiasis.  No bowel distention. Distal gas and stool.  No pneumatosis or free intraperitoneal air.  IMPRESSION: Cholelithiasis, but no evidence of acute finding in the abdomen or pelvis.   Original Report Authenticated By: Consuello Bossier, M.D.     Basename 02/04/12 0520  WBC 6.2  HGB 8.5*  HCT 26.9*  PLT 346    Basename 02/04/12 0520  NA 139  K 3.5  CL 105  CO2 26  GLUCOSE 118*  BUN 12  CREATININE 0.93  CALCIUM 8.9   CBG (last 3)   Basename 02/06/12 0708 02/05/12 2053 02/05/12 1639  GLUCAP 125* 131* 102*    Wt Readings from Last 3 Encounters:  01/23/12 79.9 kg (176 lb 2.4 oz)  01/23/12 79.9 kg (176 lb 2.4 oz)  01/23/12 79.9 kg (176 lb 2.4 oz)    Physical Exam:  Constitutional: He is oriented to person, place, and time. He appears well-developed and well-nourished.  HENT:  Head: Normocephalic.  Eyes: Pupils are equal, round, and reactive to light.  Neck: Normal range of motion.  Cardiovascular: Normal rate and regular rhythm.  Pulmonary/Chest: Effort normal and breath sounds normal.  Upper airway generally clear Abdominal: Soft. Bowel scarce, high pitched, abd slightly distended. Skin is warm and red with blanching around left incision site. Bruising also noted.  Musculoskeletal:  Right calf tender to palpation and with hamstring stretch. Negative Homans'.  Neurological: He is alert and oriented to person, place, and time.   Left facial weakness. LUE with contracture at hand and elbow.  Tone Ashworth grade 3 in  the left biceps, 1-2 finger and wrist flexor as well as the shoulder adductor  Motor strength is 2 minus in the left deltoid, biceps, triceps, grip, hip flexors,3 minus knee extensors, 2  ankle dorsiflexor plantar flexor on left and 2apf on right trace adf on right Skin:  Incision bilateral inguinal area slightly reddened with tenderness and derma bond. Left upper chest incision clean and dry with derma bond and tender to touch. Irritated perineum.  Psychiatric:  Cognition and memory are normal. He is a little emotional   Assessment/Plan: 1. Functional deficits secondary to deconditioning after axillary-fem bpg (hx of right cva) which require 3+ hours per day of interdisciplinary therapy in a comprehensive inpatient rehab setting. Physiatrist is providing close team supervision and 24 hour management of active medical problems listed below. Physiatrist and rehab team continue to assess barriers to discharge/monitor patient progress toward functional and medical goals.  -right foot drop--afo helpful. Likely from diabetes FIM: FIM - Bathing Bathing Steps Patient Completed: Chest;Left Arm;Abdomen;Right lower leg (including foot);Left lower leg (including foot);Front perineal area;Buttocks;Right upper leg;Left upper leg Bathing: 4: Min-Patient completes 8-9 88f 10 parts or 75+ percent  FIM - Upper Body Dressing/Undressing Upper body dressing/undressing steps patient completed: Thread/unthread right sleeve of pullover shirt/dresss;Thread/unthread left sleeve of pullover shirt/dress;Put head through opening of pull over shirt/dress;Pull shirt over trunk Upper body dressing/undressing: 6: More than reasonable amount of time FIM - Lower Body Dressing/Undressing Lower body dressing/undressing steps patient completed: Thread/unthread right pants leg;Thread/unthread left  pants leg;Pull pants up/down;Don/Doff right sock;Don/Doff left sock;Fasten/unfasten left shoe;Don/Doff right shoe;Don/Doff left  shoe;Fasten/unfasten right shoe Lower body dressing/undressing: 6: More than reasonable amount of time  FIM - Toileting Toileting steps completed by patient: Performs perineal hygiene Toileting Assistive Devices: Grab bar or rail for support Toileting: 2: Max-Patient completed 1 of 3 steps  FIM - Diplomatic Services operational officer Devices: Grab bars Toilet Transfers: 4-From toilet/BSC: Min A (steadying Pt. > 75%);5-To toilet/BSC: Supervision (verbal cues/safety issues)  FIM - Banker Devices: Bed rails Bed/Chair Transfer: 5: Chair or W/C > Bed: Supervision (verbal cues/safety issues);5: Bed > Chair or W/C: Supervision (verbal cues/safety issues)  FIM - Locomotion: Wheelchair Distance: pt requires cues for use of LEs for propulsion, assist for steering Locomotion: Wheelchair: 1: Total Assistance/staff pushes wheelchair (Pt<25%) FIM - Locomotion: Ambulation Locomotion: Ambulation Assistive Devices: Walker - Rolling;Orthosis;Walker - Platform;Cane - [employer] Ambulation/Gait Assistance: 4: Min assist Locomotion: Ambulation: 5: Travels 150 ft or more with supervision/safety issues  Comprehension Comprehension Mode: Auditory Comprehension: 5-Follows basic conversation/direction: With no assist  Expression Expression Mode: Verbal Expression: 5-Expresses basic needs/ideas: With no assist  Social Interaction Social Interaction: 5-Interacts appropriately 90% of the time - Needs monitoring or encouragement for participation or interaction.  Problem Solving Problem Solving: 5-Solves basic problems: With no assist  Memory Memory: 3-Recognizes or recalls 50 - 74% of the time/requires cueing 25 - 49% of the time  Medical Problem List and Plan:  1. DVT Prophylaxis/Anticoagulation: Pharmaceutical: Lovenox  2. Pain Management: prn hydrocodone.   -abdominal pain- advanced bowel regimen, enema--uses allign at home  -rx cellulitis with  keflex  -there is post-op, bruising component to pain complaints  -increased neurontin for neuro pain- will likely need to further titrate 3. Mood: Offer ego support and encouragement. Will have LCSW follow up for full evaluation.   -he is admittedly anxious after this surgery  -may have a stroke related component to mood lability 4. Neuropsych: This patient is capable of making decisions on his/her own behalf.  5. DM type 2: Monitor with ac/hs checks. Resume metformin. Continue SSI for elevated BS management/wound healing.  6. CVA with spastic L-HP: Resume aggrenox.  7. HTN: monitor with bid checks. Continue lisinopril.  8. Chronic constipation: added miralax   Resumed probiotic. SSE today  -aggressive daily bowel regimen 9. Lipids: resume lovaza per home regimen. Dc lipitor 10. Post-op anemia:  -fe supplement  -recheck hgb improved to 8.5 today   LOS (Days) 6 A FACE TO FACE EVALUATION WAS PERFORMED  Guynell Kleiber T 02/06/2012, 8:26 AM

## 2012-02-06 NOTE — Progress Notes (Signed)
Occupational Therapy Discharge Summary and Treatment Session Note  Patient Details  Name: Donald [name] MRN: [id number] Date of Birth: 07-Oct-1945  Today's Date: 02/06/2012 Time: 1340-1420 Time Calculation (min): 40 min  Patient has met 10 of 10 long term goals due to improved activity tolerance, improved balance, postural control, improved awareness and improved coordination.  Patient to discharge at overall Min A for bathing, Supervision for functional mobility with RW and shower transfers, Mod I for grooming, dressing, toileting.  Patient's care partner is independent to provide the necessary Min physical assistance and supervision at discharge. Pt with L sided hemiplegia from previous stroke and is discharging at PTA level of functioning at Min A for bathing RUE.  Pt benefits from cues to go slow during functional mobility for safety.   Reasons goals not met: NA  Recommendation:  Patient will benefit from ongoing skilled OT services in home health setting to continue to advance functional skills in the area of BADL.  Equipment: No equipment provided  Reasons for discharge: treatment goals met  Patient/family agrees with progress made and goals achieved: Yes  OT Discharge Precautions/Restrictions  Precautions Precautions: Fall Restrictions Weight Bearing Restrictions: No Pain  Burning sensation in B feet, pain on buttocks- both on-going and RN aware ADL  See FIM Vision/Perception  Vision - History Baseline Vision: Wears glasses only for reading Vision - Assessment Eye Alignment: Within Functional Limits  Cognition Overall Cognitive Status: Appears within functional limits for tasks assessed Arousal/Alertness: Awake/alert Orientation Level: Oriented X4 Behaviors: Impulsive Safety/Judgment: Impaired Sensation Sensation Light Touch: Impaired Detail Light Touch Impaired Details: Impaired LUE;Impaired LLE;Impaired RLE Proprioception: Impaired Detail Proprioception  Impaired Details: Impaired RLE;Impaired LLE;Impaired LUE Motor  Motor Motor: Hemiplegia;Abnormal tone;Ataxia;Abnormal postural alignment and control Motor - Skilled Clinical Observations: L side hemiplegia UE>LE from previous stroke  Mobility  Bed Mobility Supine to Sit: 6: Modified independent (Device/Increase time) Sitting - Scoot to Edge of Bed: 6: Modified independent (Device/Increase time) Sit to Supine: 6: Modified independent (Device/Increase time) Transfers Sit to Stand: 5: Supervision Stand to Sit: 5: Supervision  Trunk/Postural Assessment  Cervical Assessment Cervical Assessment: Within Functional Limits Thoracic Assessment Thoracic Assessment: Exceptions to [facility] Lumbar Assessment Lumbar Assessment: Exceptions to [facility] (rounded posture)  Balance Static Sitting Balance Static Sitting - Balance Support: Feet supported Static Sitting - Level of Assistance: 6: Modified independent (Device/Increase time) Static Standing Balance Static Standing - Balance Support: During functional activity Static Standing - Level of Assistance: 6: Modified independent (Device/Increase time) Dynamic Standing Balance Dynamic Standing - Level of Assistance: 6: Modified independent (Device/Increase time) Extremity/Trunk Assessment RUE Assessment RUE Assessment: Within Functional Limits LUE Assessment LUE Assessment: Exceptions to [facility] (L UE hemiplegia from previous stroke)  See FIM for current functional status  Treatment Session Note:  Self care retraining with focus on functional mobility around household environment with RW and family education in preparation for d/c home. [name] present for entire session and participated in functional mobility with pt, demonstrating understand of pt's need for supervision and cues to go slow when ambulating and transferring. Performed bed mobility from both side of the bed Mod I and transfers to and from different household surfaces (kitchen chair, couch,  toilet) S with wife. Pt c/o B LE burning with activity that decreased with rest; pain is on-going and nursing staff aware.  Jackelyn Poling 02/06/2012, 1:45 PM

## 2012-02-06 NOTE — Progress Notes (Signed)
Physical Therapy Note  Patient Details  Name: Donald [name] MRN: [id number] Date of Birth: [birth year] Today's Date: 02/06/2012  Time: 1300-1345 45 minutes  Pt with continued c/o stomach/bottom pain, RN made aware.  Treatment focused on pt/wife performing stair climbing and curb step safely.  Pt/wife demo'd flight of stairs multiple attempts safely with wife performing min A.  Pt fatigues after 1 flight of stairs.  Discussed putting a chair on the landing between their 2 flights of stairs for pt to rest, wife says she can do this.  Curb step training multiple attempts with pt/wife performing safely with wife providing min A.  Pt wife demo'd safely gait in household environment and pt performed bed <> w/c transfers with mod I.  Pt/wife ready for d/c home tomorrow.  Individual therapy   Refugia Laneve 02/06/2012, 2:02 PM

## 2012-02-06 NOTE — Progress Notes (Signed)
Physical Therapy Note  Patient Details  Name: Donald [name] MRN: [id number] Date of Birth: [birth year] Today's Date: 02/06/2012  Time: 1100-1130 30 minutes  Pt c/o pain in bottom at end of session, RN made aware.  Pt/wife educated and performed safely gait with RW, AFO with obstacle negotiation on carpet to simulate home.  Pt/wife also performed mobility in bathroom, toileting and toilet transfers safely.  Pt/wife report they feel comfortable with d/c home tomorrow.  Individual therapy   Jameek Bruntz 02/06/2012, 12:26 PM

## 2012-02-06 NOTE — Progress Notes (Signed)
Orthopedic Tech Progress Note Patient Details:  CRIAG WICKLUND 03-Aug-1945 [id number]  Patient ID: [name], male   DOB: May 16, 1945, 66 y.o.   MRN: [id number]   Shawnie Pons 02/06/2012, 8:37 AM RIGHT TOE OFF BLUE ROCKER AFO WITH SOFT INTERFACE COMPLETED BY [employer]

## 2012-02-06 NOTE — Progress Notes (Signed)
Physical Therapy Note  Patient Details  Name: DELWOOD PHILIPPS MRN: [id number] Date of Birth: 1945-06-09 Today's Date: 02/06/2012  Time: 830-855 55 minutes  Pt c/o stomach/foot pain, RN aware.  Treatment session focused on gait training with R toe off AFO which was delivered this morning.  Pt educated on donning/doffing AFO and proper wear.  Pt gait 2 x 150' with close supervision with cues for safety with turns and tight spaces.  Pt reports he feels comfortable with AFO and feels it improves his gait.  Individual therapy   Latese Dufault 02/06/2012, 8:54 AM

## 2012-02-07 ENCOUNTER — Inpatient Hospital Stay (HOSPITAL_COMMUNITY): Payer: Medicare Other | Admitting: Occupational Therapy

## 2012-02-07 ENCOUNTER — Encounter (HOSPITAL_COMMUNITY): Payer: Medicare Other | Admitting: Occupational Therapy

## 2012-02-07 ENCOUNTER — Inpatient Hospital Stay (HOSPITAL_COMMUNITY): Payer: Medicare Other | Admitting: Physical Therapy

## 2012-02-07 LAB — GLUCOSE, CAPILLARY: Glucose-Capillary: 116 mg/dL — ABNORMAL HIGH (ref 70–99)

## 2012-02-07 MED ORDER — OXYCODONE HCL 5 MG PO TABS: 5.0000 mg | ORAL_TABLET | Freq: Four times a day (QID) | ORAL | Status: DC | PRN

## 2012-02-07 MED ORDER — MINERAL OIL PO OIL: 30.0000 mL | TOPICAL_OIL | Freq: Every day | ORAL | Status: DC | PRN

## 2012-02-07 MED ORDER — GABAPENTIN 300 MG PO CAPS
300.0000 mg | ORAL_CAPSULE | Freq: Three times a day (TID) | ORAL | Status: DC
Start: 2012-02-07 — End: 2012-02-07
  Administered 2012-02-07: 300 mg via ORAL
  Filled 2012-02-07 (×4): qty 1

## 2012-02-07 MED ORDER — HEPARIN SOD (PORK) LOCK FLUSH 100 UNIT/ML IV SOLN
500.0000 [IU] | INTRAVENOUS | Status: AC | PRN
  Administered 2012-02-07: 500 [IU]

## 2012-02-07 MED ORDER — GABAPENTIN 300 MG PO CAPS: 300.0000 mg | ORAL_CAPSULE | Freq: Every day | ORAL | Status: DC

## 2012-02-07 MED ORDER — NICOTINE 7 MG/24HR TD PT24: 1.0000 | MEDICATED_PATCH | Freq: Every day | TRANSDERMAL | Status: DC

## 2012-02-07 MED ORDER — FERROUS SULFATE 325 (65 FE) MG PO TABS: 325.0000 mg | ORAL_TABLET | Freq: Two times a day (BID) | ORAL | Status: DC

## 2012-02-07 MED ORDER — NYSTATIN 100000 UNIT/GM EX POWD: CUTANEOUS | Status: DC

## 2012-02-07 MED ORDER — HYDROCORTISONE ACETATE 25 MG RE SUPP: 25.0000 mg | Freq: Two times a day (BID) | RECTAL | Status: DC

## 2012-02-07 MED ORDER — LISINOPRIL 5 MG PO TABS: 2.5000 mg | ORAL_TABLET | Freq: Every day | ORAL | Status: DC

## 2012-02-07 MED ORDER — TRAMADOL HCL 50 MG PO TABS: 50.0000 mg | ORAL_TABLET | Freq: Four times a day (QID) | ORAL | Status: DC | PRN

## 2012-02-07 MED ORDER — SENNOSIDES-DOCUSATE SODIUM 8.6-50 MG PO TABS: 1.0000 | ORAL_TABLET | Freq: Two times a day (BID) | ORAL | Status: DC

## 2012-02-07 MED ORDER — POLYETHYLENE GLYCOL 3350 17 G PO PACK: 17.0000 g | PACK | Freq: Two times a day (BID) | ORAL | Status: DC

## 2012-02-07 MED ORDER — OXYCODONE HCL 10 MG PO TB12: 10.0000 mg | ORAL_TABLET | Freq: Every day | ORAL | Status: DC

## 2012-02-07 NOTE — Progress Notes (Signed)
Patient discharged to home with wife at 40 with all belongings . Patient and wife verbalized understanding of discharge instructions . Wife verbalized understanding of wound care and diabetes education . Discharge instructions given and reviewed by P. Love,PA.   Donald [name]

## 2012-02-07 NOTE — Progress Notes (Signed)
Subjective/Complaints: "butt hurts". Trying to have bm  Objective: Vital Signs: Blood pressure 130/65, pulse 80, temperature 97.8 F (36.6 C), temperature source Oral, resp. rate 18, weight 78.518 kg (173 lb 1.6 oz), SpO2 99.00%. No results found. No results found for this basename: WBC:2,HGB:2,HCT:2,PLT:2 in the last 72 hours No results found for this basename: NA:2,K:2,CL:2,CO2:2,GLUCOSE:2,BUN:2,CREATININE:2,CALCIUM:2 in the last 72 hours CBG (last 3)   Basename 02/06/12 2055 02/06/12 1634 02/06/12 1137  GLUCAP 98 151* 95    Wt Readings from Last 3 Encounters:  02/06/12 78.518 kg (173 lb 1.6 oz)  01/23/12 79.9 kg (176 lb 2.4 oz)  01/23/12 79.9 kg (176 lb 2.4 oz)    Physical Exam:  Constitutional: He is oriented to person, place, and time. He appears well-developed and well-nourished.  HENT:  Head: Normocephalic.  Eyes: Pupils are equal, round, and reactive to light.  Neck: Normal range of motion.  Cardiovascular: Normal rate and regular rhythm.  Pulmonary/Chest: Effort normal and breath sounds normal.  Upper airway generally clear Abdominal: Soft. Bowel scarce, high pitched, abd slightly distended. Skin is warm and red with blanching around left incision site. Bruising also noted.  Musculoskeletal:  Right calf tender to palpation and with hamstring stretch. Negative Homans'.  Neurological: He is alert and oriented to person, place, and time.   Left facial weakness. LUE with contracture at hand and elbow.  Tone Ashworth grade 3 in the left biceps, 1-2 finger and wrist flexor as well as the shoulder adductor  Motor strength is 2 minus in the left deltoid, biceps, triceps, grip, hip flexors,3 minus knee extensors, 2  ankle dorsiflexor plantar flexor on left and 2apf on right trace adf on right Skin:  Incision bilateral inguinal area slightly reddened with tenderness and derma bond. Left upper chest incision clean and dry with derma bond and tender to touch. Irritated perineum  and ulcerartion on inner glut Psychiatric:  Cognition and memory are normal. He is a little emotional   Assessment/Plan: 1. Functional deficits secondary to deconditioning after axillary-fem bpg (hx of right cva) which require 3+ hours per day of interdisciplinary therapy in a comprehensive inpatient rehab setting. Physiatrist is providing close team supervision and 24 hour management of active medical problems listed below. Physiatrist and rehab team continue to assess barriers to discharge/monitor patient progress toward functional and medical goals.  -dc home today.  hh follow up rn, pt, ot FIM: FIM - Bathing Bathing Steps Patient Completed: Chest;Left Arm;Abdomen;Front perineal area;Buttocks;Right upper leg;Left upper leg;Right lower leg (including foot);Left lower leg (including foot) Bathing: 4: Min-Patient completes 8-9 22f 10 parts or 75+ percent  FIM - Upper Body Dressing/Undressing Upper body dressing/undressing steps patient completed: Thread/unthread right sleeve of pullover shirt/dresss;Thread/unthread left sleeve of pullover shirt/dress;Put head through opening of pull over shirt/dress;Pull shirt over trunk Upper body dressing/undressing: 6: More than reasonable amount of time FIM - Lower Body Dressing/Undressing Lower body dressing/undressing steps patient completed: Thread/unthread right pants leg;Thread/unthread left pants leg;Pull pants up/down;Don/Doff right sock;Don/Doff left sock;Don/Doff left shoe;Don/Doff right shoe;Fasten/unfasten right shoe;Fasten/unfasten left shoe Lower body dressing/undressing: 6: Assistive device (Comment)  FIM - Toileting Toileting steps completed by patient: Performs perineal hygiene Toileting Assistive Devices: Grab bar or rail for support Toileting: 4: Steadying assist  FIM - Diplomatic Services operational officer Devices: Grab bars Toilet Transfers: 4-From toilet/BSC: Min A (steadying Pt. > 75%);5-To toilet/BSC: Supervision (verbal  cues/safety issues)  FIM - Banker Devices: Manufacturing systems engineer Transfer: 6: Supine > Sit: No assist;5: Bed >  Chair or W/C: Supervision (verbal cues/safety issues);5: Chair or W/C > Bed: Supervision (verbal cues/safety issues)  FIM - Locomotion: Wheelchair Distance: pt requires cues for use of LEs for propulsion, assist for steering Locomotion: Wheelchair: 1: Total Assistance/staff pushes wheelchair (Pt<25%) FIM - Locomotion: Ambulation Locomotion: Ambulation Assistive Devices: Walker - Rolling;Orthosis;Walker - Platform;Cane - [employer] Ambulation/Gait Assistance: 4: Min assist Locomotion: Ambulation: 5: Travels 150 ft or more with supervision/safety issues  Comprehension Comprehension Mode: Auditory Comprehension: 5-Follows basic conversation/direction: With no assist  Expression Expression Mode: Verbal Expression: 5-Expresses basic needs/ideas: With extra time/assistive device  Social Interaction Social Interaction: 5-Interacts appropriately 90% of the time - Needs monitoring or encouragement for participation or interaction.  Problem Solving Problem Solving: 5-Solves basic 90% of the time/requires cueing < 10% of the time  Memory Memory: 3-Recognizes or recalls 50 - 74% of the time/requires cueing 25 - 49% of the time  Medical Problem List and Plan:  1. DVT Prophylaxis/Anticoagulation: Pharmaceutical: Lovenox  2. Pain Management: prn hydrocodone.   -abdominal pain- advanced bowel regimen, enema--uses allign at home  -rx cellulitis with keflex---can dc  -there is post-op, bruising component to pain complaints  -increased neurontin for neuro pain- will titrate further for home mgt 3. Mood: Offer ego support and encouragement. Will have LCSW follow up for full evaluation.   -he is admittedly anxious after this surgery  -may have a stroke related component to mood lability 4. Neuropsych: This patient is capable of making decisions on his/her  own behalf.  5. DM type 2: Monitor with ac/hs checks. Resume metformin. Continue SSI for elevated BS management/wound healing.  6. CVA with spastic L-HP: Resumed aggrenox.  7. HTN: monitor with bid checks. Continue lisinopril.  8. Chronic constipation: added miralax   Resumed probiotic. SSE today  -aggressive daily bowel regimen  -ulceration on buttock---needs barrier cream  -hhRN for follow up 9. Lipids: resume lovaza per home regimen. Dc lipitor 10. Post-op anemia:  -fe supplement  -recheck hgb improved to 8.5    LOS (Days) 7 A FACE TO FACE EVALUATION WAS PERFORMED  Donald [name] 02/07/2012, 7:10 AM

## 2012-02-07 NOTE — Progress Notes (Signed)
Social Work  Discharge Note  The overall goal for the admission was met for:   Discharge location: Yes - home with wife who can assist as needed  Length of Stay: Yes - 7 days  Discharge activity level: Yes - supervision to modified independent  Home/community participation: Yes  Services provided included: MD, RD, PT, OT, RN, TR, Pharmacy and SW  Financial Services: Medicare and Private Insurance: [employer]  Follow-up services arranged: Home Health: Charity fundraiser, PT, OT via [facility] and Patient/Family has no preference for HH/DME agencies  Comments (or additional information):  Patient/Family verbalized understanding of follow-up arrangements: Yes  Individual responsible for coordination of the follow-up plan: patient  Confirmed correct DME delivered: NA  Donald [name]

## 2012-02-08 ENCOUNTER — Inpatient Hospital Stay (HOSPITAL_COMMUNITY): Payer: Medicare Other | Admitting: Physical Therapy

## 2012-02-08 ENCOUNTER — Encounter (HOSPITAL_COMMUNITY): Payer: Medicare Other | Admitting: Occupational Therapy

## 2012-02-08 ENCOUNTER — Inpatient Hospital Stay (HOSPITAL_COMMUNITY): Payer: Medicare Other | Admitting: Occupational Therapy

## 2012-02-11 ENCOUNTER — Telehealth: Payer: Self-pay | Admitting: *Deleted

## 2012-02-11 NOTE — Telephone Encounter (Signed)
Patient's family member called and canceled appt for 10/16. Patient had flsuh in hospital./  JMW

## 2012-02-12 NOTE — Discharge Summary (Signed)
NAMEMarland Kitchen  HARRIS, CAPELLAN NO.:  [id number]  MEDICAL RECORD NO.:  [id number]  LOCATION:  4004                         FACILITY:  [facility]  PHYSICIAN:  Ranelle Oyster, M.D.DATE OF BIRTH:  08/22/1945  DATE OF ADMISSION:  01/31/2012 DATE OF DISCHARGE:  02/07/2012                              DISCHARGE SUMMARY   DISCHARGE DIAGNOSES: 1. Deconditioning due to multiple medical issues. 2. Peripheral vascular disease with axillofemoral bypass graft. 3. 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 4. Acute on chronic constipation. 5. Diabetes mellitus, type 2. 6. Postoperative anemia.  HISTORY OF PRESENT ILLNESS:  Donald [name] is a 66 year old male with history of CVA with left hemiparesis, CAD, ongoing tobacco abuse admitted via ED on January 23, 2012, with right foot pain due to worsening of occlusive disease.  Lower extremity CTA revealed significant peripheral vascular disease and the patient underwent left axillary bifemoral bypass graft by Dr. Darrick Penna on January 29, 2012. Postop therapy team recommended CIR for progression.  PAST MEDICAL HISTORY: 1. GERD. 2. Hyperlipidemia. 3. Hypertension. 4. Peripheral vascular disease. 5. Type 2 diabetes mellitus. 6. Pneumonia. 7. History of internal and external bleeding hemorrhoids. 8. Stroke with left hemiparesis. 9. History of gout. 10.Anal cancer, treated with chemo and radiation, liver mets.  FUNCTIONAL HISTORY:  The patient was independent for ADLs and mobility prior to admission.  FUNCTIONAL STATUS:  The patient was mod assist for bed mobility, mod assist stand pivot transfers, +2 total assist 60% for ambulating 6 feet.  LABORATORY DATA:  CBC from February 04, 2012, revealed hemoglobin 8.5, hematocrit 26.9, white count 6.2, platelets 346.  Check of lytes revealed sodium 139, potassium 3.5, chloride 105, CO2 of 26, BUN 12, creatinine 0.93, glucose 118.  Urine culture on  January 31, 2012, showed no growth.  RADIOLOGIC REPORTS:  KUB done showed cholelithiasis with distal gas and stool.  No acute findings.  HOSPITAL COURSE:  Donald [name] was admitted to Rehab on January 31, 2012, for inpatient therapies to consist of PT, OT at least 3 hours 5 days a week.  Past admission, physiatrist, rehab, RN, and therapy team have worked together to provide customized collaborative interdisciplinary care.  Rehab RN has worked with the patient on bowel and bladder program as well as wound care monitoring.  The patient has required aggressive bowel program due to severe constipation.  He was treated with multiple laxatives with improvement in his rectal symptoms. Problems with hemorrhoids were treated with Anusol-HC suppositories. His acute blood loss anemia was monitored and was noted to be stable. He was noted to have evidence of early cellulitis in lower extremities and was treated with Keflex for this.  Moisture associated skin breakdown on buttocks was treated with barrier cream.  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  The patient's lower extremity pain and neuropathy were treated with adjustment of Neurontin.  Diabetes was monitored with before meals and nightly CBG checks and metformin was resumed.  Blood sugars at the time of discharge were ranging from 98 to occasional high 150s.  P.o. intake Has been good.  During the patient's stay in rehab, team conferences were held to monitor the patient's progress, set goals, as well as discuss barriers to discharge.  Physical Therapy has worked with the patient on activity tolerance, balance, strengthening as well as coordination. Patient progressed to being at modified independent level for transfers. He required close supervision for gait with rolling walker and right AFO.  He required supervision/min assist for navigating  2 flights of stairs.  OT has worked with the patient on self-care tasks.  The patient is modified independent for grooming, dressing, and toileting.  He required min assist for bathing.  Family education was done with wife regarding need for supervision as well as on providing cues for the patient to reduce speed for safety with mobility.  Further followup home health therapies to continue past discharge.  On February 07, 2012, the patient is discharged to home in improved condition.  DISCHARGE MEDICATIONS: 1. Acidophilus 2 tabs p.o. per day. 2. Aggrenox 1 p.o. b.i.d. 3. Ferrous sulfate 325 mg b.i.d. 4. Neurontin 300 mg p.o. t.i.d. 5. Anusol-HC cream b.i.d. p.r.n. rectal irritation. 6. Lisinopril 5 mg half p.o. per day. 7. Metformin 500 mg b.i.d. 8. Mineral oil or olive oil p.o. daily as needed for constipation. 9. Nicotine patch 20 mg an hour x6 weeks then decrease to 14 mg daily  x6 weeks then 10 mg x6 weeks. 10.Nystatin powder to skin folds t.i.d. 11.Lovaza 2 g p.o. b.i.d. 12.Prilosec 40 mg p.o. per day. 13.OxyIR 5 mg 1-2 p.o. q.6 hours p.r.n. moderate-to-severe pain, #60     Rx. 14.OxyContin 10 mg 1 p.o. daily #7 Rx. 15.MiraLAX 17 g in 8 ounces p.o. b.i.d. 16.Tramadol 50 mg p.o. q.6 hours p.r.n. mild pain.  DIET:  Carb-modified medium.  ACTIVITIES:  24-hour supervision.  SPECIAL INSTRUCTIONS:  Keep wound clean and dry.  Zinc oxide cream to irritated area on buttocks.  Increase MiraLAX to up to 3 times a day as needed for p.r.n. constipation.  [facility] to provide PT, OT, RN.  FOLLOWUP:  The patient to follow up with Dr. Fabienne Bruns on February 21, 2012, at 1 p.m.  Follow up with Dr. Faith Rogue as needed. Follow up with Dr. Mirna Mires on February 18, 2012, at 1:45 p.m. For post hospital visit and discuss benefits/side effects of chantix for smoking cessation.     Delle Reining, P.A.   ______________________________ Ranelle Oyster, M.D.    PL/MEDQ   D:  02/11/2012  T:  02/12/2012  Job:  [id number]  cc:   Janetta Hora. Darrick Penna, MD Annia Friendly. Loleta Chance, MD

## 2012-02-20 ENCOUNTER — Encounter: Payer: Self-pay | Admitting: Vascular Surgery

## 2012-02-21 ENCOUNTER — Other Ambulatory Visit (INDEPENDENT_AMBULATORY_CARE_PROVIDER_SITE_OTHER): Payer: Medicare Other | Admitting: *Deleted

## 2012-02-21 ENCOUNTER — Ambulatory Visit (INDEPENDENT_AMBULATORY_CARE_PROVIDER_SITE_OTHER): Payer: Medicare Other | Admitting: Vascular Surgery

## 2012-02-21 ENCOUNTER — Encounter: Payer: Self-pay | Admitting: Vascular Surgery

## 2012-02-21 ENCOUNTER — Ambulatory Visit (INDEPENDENT_AMBULATORY_CARE_PROVIDER_SITE_OTHER): Payer: Medicare Other | Admitting: *Deleted

## 2012-02-21 VITALS — BP 121/78 | HR 93 | Resp 20 | Ht 69.0 in | Wt 173.0 lb

## 2012-02-21 DIAGNOSIS — Z48812 Encounter for surgical aftercare following surgery on the circulatory system: Secondary | ICD-10-CM

## 2012-02-21 DIAGNOSIS — I739 Peripheral vascular disease, unspecified: Secondary | ICD-10-CM

## 2012-02-21 DIAGNOSIS — I6529 Occlusion and stenosis of unspecified carotid artery: Secondary | ICD-10-CM

## 2012-02-21 NOTE — Progress Notes (Signed)
Patient is a 66 year old male who recently underwent left axillobifemoral bypass graft on October 1. He returns today for postoperative followup. This was primarily done for rest pain. However he has had some ulcers on his feet recently. States he feels like his legs are better overall. He ambulates with a walker.  Physical exam: Filed Vitals:   02/21/12 1417  BP: 121/78  Pulse: 93  Resp: 20  Height: 5\' 9"  (1.753 m)  Weight: 173 lb (78.472 kg)   Left chest axillary incision well-healed audible Doppler flow axillary to left femoral bypass as well as left or right femoral femoral bypass the left groin wound has some superficial maceration and this was debrided today the right groin wound is healing he does not have palpable popliteal or pedal pulses.  Skin: Multiple ulcerations both feet but these are all drying out and appeared to be healing toes left foot are dusky  Data: The patient had bilateral ABIs today. They were 0. 5 on the right, 0.43 on the left. I reviewed and interpreted this study. Although the study is similar to that of 2011. It is a significant improvement from when he was in the hospital where he had no Doppler signals in either foot.  Assessment: Patent axillary bifemoral bypass was still marginal perfusion to both feet. The patient has fairly debilitated overall I would not consider an outflow procedure in him. If he has further deterioration of his legs the only option would be an amputation. We will start local wound care of the left groin incision. The patient will followup in 2 weeks to recheck the left groin wound. He will do Santyl to the left groin once daily.  Plan: See above  Fabienne Bruns, MD [facility] [place] Office: [phone] Pager: [phone]

## 2012-03-04 ENCOUNTER — Other Ambulatory Visit: Payer: Self-pay | Admitting: *Deleted

## 2012-03-04 DIAGNOSIS — T8149XA Infection following a procedure, other surgical site, initial encounter: Secondary | ICD-10-CM

## 2012-03-04 MED ORDER — COLLAGENASE 250 UNIT/GM EX OINT: TOPICAL_OINTMENT | Freq: Every day | CUTANEOUS | Status: DC

## 2012-03-05 ENCOUNTER — Encounter: Payer: Self-pay | Admitting: Vascular Surgery

## 2012-03-06 ENCOUNTER — Ambulatory Visit (INDEPENDENT_AMBULATORY_CARE_PROVIDER_SITE_OTHER): Payer: Medicare Other | Admitting: Vascular Surgery

## 2012-03-06 ENCOUNTER — Encounter: Payer: Self-pay | Admitting: Vascular Surgery

## 2012-03-06 VITALS — BP 137/72 | HR 60 | Temp 98.2°F | Ht 69.0 in | Wt 171.0 lb

## 2012-03-06 DIAGNOSIS — I739 Peripheral vascular disease, unspecified: Secondary | ICD-10-CM | POA: Insufficient documentation

## 2012-03-06 NOTE — Progress Notes (Signed)
Patient is a 66 year old male who returns for followup today after a recent left axillary bifemoral bypass graft. This was done October 1. He has had some trouble healing of his left groin incision. The operation was done for rest pain.  He still has pain in his left foot intermittently especially the left second toe  Physical exam: Filed Vitals:   03/06/12 1503  BP: 137/72  Pulse: 60  Temp: 98.2 F (36.8 C)  TempSrc: Oral  Height: 5\' 9"  (1.753 m)  Weight: 171 lb (77.565 kg)  SpO2: 100%   Left groin 3 x 2 cm open wound with no exposed graft the wound is approximately 3 mm in depth there is good granulation tissue the skin around both groin incisions is slightly irritated which the patient states is from tape.  Right groin wound is essentially healed at this point. There is Doppler flow in the axfem and fem-fem  Extremities: Ulceration left second toe with some early gangrenous changes, right foot with scaly dried skin warm and well-perfused  Assessment: Patent axillary bifemoral bypass with slowly healing left groin wound no obvious graft infection at this point but obviously at risk.  Marginal perfusion left foot which may ultimately require amputation of the toe or major higher amputation  Plan: Continue collagenase left groin wound followup in 2 weeks. Have discussed with the patient the possibility of a amputation of the first and possibly second toe if progressive ischemic changes and also the possibility that he may be headed for a major amputation. Also discussed with the patient the risk of graft infection and that hopefully the left groin wound will continue to heal uneventfully  Fabienne Bruns, MD [facility] [place] Office: [phone] Pager: [phone]

## 2012-03-19 ENCOUNTER — Encounter: Payer: Self-pay | Admitting: Vascular Surgery

## 2012-03-20 ENCOUNTER — Ambulatory Visit (INDEPENDENT_AMBULATORY_CARE_PROVIDER_SITE_OTHER): Payer: Medicare Other | Admitting: Vascular Surgery

## 2012-03-20 ENCOUNTER — Encounter: Payer: Self-pay | Admitting: Vascular Surgery

## 2012-03-20 VITALS — BP 145/71 | HR 91 | Ht 69.0 in | Wt 174.6 lb

## 2012-03-20 DIAGNOSIS — I739 Peripheral vascular disease, unspecified: Secondary | ICD-10-CM

## 2012-03-20 NOTE — Progress Notes (Signed)
Patient is a 66 year old male who underwent axillary bifemoral bypass on October 1. He returns today to recheck his left groin incision. He currently denies any rest pain. He also has an ulcer on the left second toe.  Physical exam: Filed Vitals:   03/20/12 1614  BP: 145/71  Pulse: 91  Height: 5\' 9"  (1.753 m)  Weight: 174 lb 9.6 oz (79.198 kg)  SpO2: 100%   Fem-fem has audible Doppler bruit. There is a slight amount of erythema at this appeared aspect of the right groin incision. There is a 2 x 2 cm opening at the superior aspect of the left groin incision. This is good granulation tissue with no significant drainage.  The left second toe ulcer is healing.  Assessment: Patent axillary bifemoral bypass with healing left second toe ulcer and healing left groin incision  Plan: Continue Santyl left groin followup 2 weeks to recheck left groin incision  Fabienne Bruns, MD [facility] [place] Office: [phone] Pager: [phone]

## 2012-03-26 ENCOUNTER — Ambulatory Visit (HOSPITAL_BASED_OUTPATIENT_CLINIC_OR_DEPARTMENT_OTHER): Payer: Medicare Other

## 2012-03-26 VITALS — BP 140/66 | HR 90 | Temp 97.7°F

## 2012-03-26 DIAGNOSIS — Z452 Encounter for adjustment and management of vascular access device: Secondary | ICD-10-CM

## 2012-03-26 DIAGNOSIS — C210 Malignant neoplasm of anus, unspecified: Secondary | ICD-10-CM

## 2012-03-26 MED ORDER — SODIUM CHLORIDE 0.9 % IJ SOLN
10.0000 mL | INTRAMUSCULAR | Status: DC | PRN
  Administered 2012-03-26: 10 mL via INTRAVENOUS
  Filled 2012-03-26: qty 10

## 2012-03-26 MED ORDER — HEPARIN SOD (PORK) LOCK FLUSH 100 UNIT/ML IV SOLN
500.0000 [IU] | Freq: Once | INTRAVENOUS | Status: AC
  Administered 2012-03-26: 500 [IU] via INTRAVENOUS
  Filled 2012-03-26: qty 5

## 2012-03-26 NOTE — Patient Instructions (Signed)
Cal MD for problems 

## 2012-04-01 ENCOUNTER — Encounter (INDEPENDENT_AMBULATORY_CARE_PROVIDER_SITE_OTHER): Payer: Self-pay | Admitting: Surgery

## 2012-04-01 ENCOUNTER — Ambulatory Visit (INDEPENDENT_AMBULATORY_CARE_PROVIDER_SITE_OTHER): Payer: Medicare Other | Admitting: Surgery

## 2012-04-01 VITALS — BP 136/70 | HR 90 | Temp 97.0°F | Resp 18 | Ht 66.0 in | Wt 177.6 lb

## 2012-04-01 DIAGNOSIS — Z8589 Personal history of malignant neoplasm of other organs and systems: Secondary | ICD-10-CM

## 2012-04-01 NOTE — Patient Instructions (Signed)
Return 6 months

## 2012-04-01 NOTE — Progress Notes (Signed)
Subjective:     Patient ID: Donald [name], male   DOB: Jan 26, 1946, 66 y.o.   MRN: [id number]  HPIPatient returns for 6 month followup due to history of anal canal carcinoma. He underwent chemotherapy radiation therapy back in 2012 but developed a liver metastasis treated by radiofrequency ablation in July of 2012. He has completed chemotherapy. He continues to have some complaints of rectal and anal canal pain. He does have some occasional bleeding. Underwent angioplasty of  Right iliac artery recently. Legs feel better.   Review of Systems  Respiratory: Negative.   Cardiovascular: Negative.   Gastrointestinal: Positive for anal bleeding and rectal pain.  Psychiatric/Behavioral: Positive for dysphoric mood.       Objective:   Physical Exam  Constitutional: He appears well-developed and well-nourished.  Eyes: EOM are normal. Pupils are equal, round, and reactive to light.  Genitourinary:   Anal canal atrophic but no evidence of recurrence  Skin: Skin is warm and dry.       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

## 2012-04-09 ENCOUNTER — Encounter: Payer: Self-pay | Admitting: Vascular Surgery

## 2012-04-10 ENCOUNTER — Encounter: Payer: Self-pay | Admitting: Vascular Surgery

## 2012-04-10 ENCOUNTER — Ambulatory Visit (INDEPENDENT_AMBULATORY_CARE_PROVIDER_SITE_OTHER): Payer: Medicare Other | Admitting: Vascular Surgery

## 2012-04-10 VITALS — BP 138/67 | HR 94 | Ht 66.0 in | Wt 178.0 lb

## 2012-04-10 DIAGNOSIS — I7025 Atherosclerosis of native arteries of other extremities with ulceration: Secondary | ICD-10-CM | POA: Insufficient documentation

## 2012-04-10 DIAGNOSIS — Z48812 Encounter for surgical aftercare following surgery on the circulatory system: Secondary | ICD-10-CM

## 2012-04-10 DIAGNOSIS — I739 Peripheral vascular disease, unspecified: Secondary | ICD-10-CM

## 2012-04-10 NOTE — Progress Notes (Signed)
Patient is a 66 year old male status post axillary bifemoral bypass October 1. This was based on the left axillary artery. He returns today to recheck his left groin incision. He states that the ulcers on his toes are healing. He recently had his toenails trimmed by the podiatrist.  He states that he has increasing strength in both lower extremities and is walking some but mainly just transferring.  Physical exam: Filed Vitals:   04/10/12 1430  BP: 138/67  Pulse: 94  Height: 5\' 6"  (1.676 m)  Weight: 178 lb (80.74 kg)  SpO2: 97%   There is audible Doppler flow over the axillary limb on the left side as well as in the fem-fem. The left groin incision continues to heal his 2 cm x 1 cm with beefy granulation tissue at the base and no significant drainage or erythema  Feet are pink and warm bilaterally with brisk capillary refill. The ulcer on his left second toe continues to shrink and heal.  Assessment: Healing left groin incision with patent axbifem  Plan: Continue local wound care left groin followup one month with ABIs at that time  Fabienne Bruns, MD [facility] [place] Office: [phone] Pager: [phone]

## 2012-04-11 NOTE — Addendum Note (Signed)
Addended by: Sharee Pimple on: 04/11/2012 07:46 AM   Modules accepted: Orders

## 2012-04-19 ENCOUNTER — Telehealth: Payer: Self-pay | Admitting: Oncology

## 2012-04-19 NOTE — Telephone Encounter (Signed)
LVOM for pt to return call in re r/s appt.  °

## 2012-05-03 ENCOUNTER — Encounter: Payer: Self-pay | Admitting: *Deleted

## 2012-05-07 ENCOUNTER — Ambulatory Visit (HOSPITAL_BASED_OUTPATIENT_CLINIC_OR_DEPARTMENT_OTHER): Payer: Medicare Other

## 2012-05-07 VITALS — BP 152/75 | HR 93 | Temp 97.4°F | Resp 20

## 2012-05-07 DIAGNOSIS — Z452 Encounter for adjustment and management of vascular access device: Secondary | ICD-10-CM

## 2012-05-07 DIAGNOSIS — C210 Malignant neoplasm of anus, unspecified: Secondary | ICD-10-CM

## 2012-05-07 MED ORDER — SODIUM CHLORIDE 0.9 % IJ SOLN
10.0000 mL | INTRAMUSCULAR | Status: DC | PRN
  Administered 2012-05-07: 10 mL via INTRAVENOUS
  Filled 2012-05-07: qty 10

## 2012-05-07 MED ORDER — HEPARIN SOD (PORK) LOCK FLUSH 100 UNIT/ML IV SOLN
500.0000 [IU] | Freq: Once | INTRAVENOUS | Status: AC
  Administered 2012-05-07: 500 [IU] via INTRAVENOUS
  Filled 2012-05-07: qty 5

## 2012-05-13 ENCOUNTER — Telehealth: Payer: Self-pay | Admitting: Emergency Medicine

## 2012-05-13 ENCOUNTER — Telehealth: Payer: Self-pay | Admitting: Oncology

## 2012-05-13 NOTE — Telephone Encounter (Signed)
Returned call and s/w wife moving 1/22 appt to PM. No PM available. appt moved to 2/6. Wife has new d/t and aware of appts 1/20.

## 2012-05-13 NOTE — Telephone Encounter (Signed)
ERROR

## 2012-05-14 ENCOUNTER — Encounter: Payer: Self-pay | Admitting: Vascular Surgery

## 2012-05-15 ENCOUNTER — Encounter: Payer: Self-pay | Admitting: Vascular Surgery

## 2012-05-15 ENCOUNTER — Encounter (INDEPENDENT_AMBULATORY_CARE_PROVIDER_SITE_OTHER): Payer: Medicare Other | Admitting: *Deleted

## 2012-05-15 ENCOUNTER — Ambulatory Visit (INDEPENDENT_AMBULATORY_CARE_PROVIDER_SITE_OTHER): Payer: Medicare Other | Admitting: Vascular Surgery

## 2012-05-15 VITALS — BP 136/80 | HR 91 | Resp 16 | Ht 69.0 in | Wt 182.0 lb

## 2012-05-15 DIAGNOSIS — Z48812 Encounter for surgical aftercare following surgery on the circulatory system: Secondary | ICD-10-CM

## 2012-05-15 DIAGNOSIS — I6529 Occlusion and stenosis of unspecified carotid artery: Secondary | ICD-10-CM

## 2012-05-15 DIAGNOSIS — L98499 Non-pressure chronic ulcer of skin of other sites with unspecified severity: Secondary | ICD-10-CM

## 2012-05-15 DIAGNOSIS — I739 Peripheral vascular disease, unspecified: Secondary | ICD-10-CM

## 2012-05-15 NOTE — Progress Notes (Signed)
Patient is a 67 year old male who underwent axillary bifemoral bypass October 1. He had some trouble healing his left groin incision. He also had an ulceration on his left foot. He returns for followup today. He has bilateral lower extremity weakness but overall since he feels better.  Review of systems: He has shortness of breath with exertion. He walks with a cane. He denies chest pain.  Physical exam: Filed Vitals:   05/15/12 1353  BP: 136/80  Pulse: 91  Resp: 16  Height: 5\' 9"  (1.753 m)  Weight: 182 lb (82.555 kg)  SpO2: 97%   Chest: Palpable axillary bypass graft pulse  Extremities: 2+ femoral pulses bilaterally incisions well-healed, dusky second third toe left foot  Data: Patient had bilateral ABIs performed today I reviewed and interpreted this study ABI on left was 0.43, right was 0.53  Assessment: Patent axillary bifemoral bypass graft with marginal flow to both feet. He is not a very good candidate for further revascularization due to his overall debility. He is trying to quit smoking.  Plan: Followup ABIs in 3 months time and recheck left foot if this continues to deteriorate would consider arteriogram  Fabienne Bruns, MD [facility] [place] Office: [phone] Pager: [phone]

## 2012-05-16 NOTE — Addendum Note (Signed)
Addended by: Sharee Pimple on: 05/16/2012 08:16 AM   Modules accepted: Orders

## 2012-05-19 ENCOUNTER — Other Ambulatory Visit (HOSPITAL_BASED_OUTPATIENT_CLINIC_OR_DEPARTMENT_OTHER): Payer: Medicare Other | Admitting: Lab

## 2012-05-19 ENCOUNTER — Ambulatory Visit (HOSPITAL_COMMUNITY)
Admission: RE | Admit: 2012-05-19 | Discharge: 2012-05-19 | Disposition: A | Discharge status: Home / Self Care | Payer: Medicare Other | Source: Ambulatory Visit | Attending: Hematology and Oncology | Admitting: Hematology and Oncology

## 2012-05-19 DIAGNOSIS — C210 Malignant neoplasm of anus, unspecified: Secondary | ICD-10-CM

## 2012-05-19 DIAGNOSIS — I700 Atherosclerosis of aorta: Secondary | ICD-10-CM | POA: Insufficient documentation

## 2012-05-19 DIAGNOSIS — K7689 Other specified diseases of liver: Secondary | ICD-10-CM | POA: Insufficient documentation

## 2012-05-19 DIAGNOSIS — K8020 Calculus of gallbladder without cholecystitis without obstruction: Secondary | ICD-10-CM | POA: Insufficient documentation

## 2012-05-19 LAB — COMPREHENSIVE METABOLIC PANEL (CC13)
ALT: 30 U/L (ref 0–55)
AST: 32 U/L (ref 5–34)
Albumin: 3.4 g/dL — ABNORMAL LOW (ref 3.5–5.0)
Calcium: 9.8 mg/dL (ref 8.4–10.4)
Chloride: 102 mEq/L (ref 98–107)
Potassium: 4.9 mEq/L (ref 3.5–5.1)
Sodium: 138 mEq/L (ref 136–145)
Total Protein: 7.7 g/dL (ref 6.4–8.3)

## 2012-05-19 LAB — CBC WITH DIFFERENTIAL/PLATELET
BASO%: 1 % (ref 0.0–2.0)
Basophils Absolute: 0.1 10*3/uL (ref 0.0–0.1)
EOS%: 3.2 % (ref 0.0–7.0)
HGB: 12.8 g/dL — ABNORMAL LOW (ref 13.0–17.1)
MCH: 30.2 pg (ref 27.2–33.4)
MCHC: 33.3 g/dL (ref 32.0–36.0)
RBC: 4.25 10*6/uL (ref 4.20–5.82)
RDW: 15.1 % — ABNORMAL HIGH (ref 11.0–14.6)
lymph#: 1.7 10*3/uL (ref 0.9–3.3)

## 2012-05-19 MED ORDER — IOHEXOL 300 MG/ML  SOLN
100.0000 mL | Freq: Once | INTRAMUSCULAR | Status: AC | PRN
  Administered 2012-05-19: 100 mL via INTRAVENOUS

## 2012-05-21 ENCOUNTER — Ambulatory Visit: Payer: Self-pay | Admitting: Oncology

## 2012-06-05 ENCOUNTER — Encounter: Payer: Self-pay | Admitting: Oncology

## 2012-06-05 ENCOUNTER — Telehealth: Payer: Self-pay | Admitting: Oncology

## 2012-06-05 ENCOUNTER — Ambulatory Visit (HOSPITAL_BASED_OUTPATIENT_CLINIC_OR_DEPARTMENT_OTHER): Payer: Medicare Other | Admitting: Oncology

## 2012-06-05 VITALS — BP 145/78 | HR 91 | Temp 98.2°F | Resp 20 | Ht 69.0 in | Wt 181.8 lb

## 2012-06-05 DIAGNOSIS — E119 Type 2 diabetes mellitus without complications: Secondary | ICD-10-CM

## 2012-06-05 DIAGNOSIS — C210 Malignant neoplasm of anus, unspecified: Secondary | ICD-10-CM

## 2012-06-05 DIAGNOSIS — C787 Secondary malignant neoplasm of liver and intrahepatic bile duct: Secondary | ICD-10-CM

## 2012-06-05 NOTE — Telephone Encounter (Signed)
Gave pt appt for flush every 4 weeks then see MD on August lab before CT, gave pt oral contrast

## 2012-06-05 NOTE — Progress Notes (Signed)
Hematology and Oncology Follow Up Visit  Donald [name] [id number] October 20, 1945 67 y.o. 06/05/2012 2:31 PM Evlyn Courier, MDHill, Earvin Hansen, MD   Principle Diagnosis: Recurrent anal cancer with liver metastases diagnosed in 2012. Tumor was poorly differentiated and had a solitary liver met.  Prior Therapy: 1. S/P 3 cycles of Cisplatin and 5-FU 08/2010 through 10/2010. 2. S/P percutaneous microwave ablation to the liver met in 01/2011.  Current therapy: Watchful observation  Interim History:  Donald [name] is seen today for routine follow-up with his wife. States he underwent a fem-pop bypass in October 2013. He has slowly recovered from this. Still having lower extremity weakness. He is able to ambulate with the assistance of a cane. He had a CVA in 2008 and has residual left arm hemiparesis. Reports appetite is good. Weight is stable compared to last visit 6 months ago. Denies abdominal pain, nausea, vomiting. No bleeding noted. No change in his bowel or bladder habits.   Medications: I have reviewed the patient's current medications. Current outpatient prescriptions:docusate sodium (COLACE) 100 MG capsule, Take 200 mg by mouth 2 (two) times daily., Disp: , Rfl: ;  acidophilus (RISAQUAD) CAPS, Take by mouth daily.  , Disp: , Rfl: ;  Ascorbic Acid (VITAMIN C) 1000 MG tablet, Take 1,000 mg by mouth daily., Disp: , Rfl: ;  buPROPion (ZYBAN) 150 MG 12 hr tablet, Take 150 mg by mouth daily. , Disp: , Rfl:  dipyridamole-aspirin (AGGRENOX) 25-200 MG per 12 hr capsule, Take 1 capsule by mouth 2 (two) times daily. , Disp: , Rfl: ;  gabapentin (NEURONTIN) 300 MG capsule, Take 100 mg by mouth. 200 mg twice daily, Disp: , Rfl: ;  lisinopril (PRINIVIL,ZESTRIL) 5 MG tablet, Take 0.5 tablets (2.5 mg total) by mouth daily., Disp: , Rfl: ;  metFORMIN (GLUMETZA) 500 MG (MOD) 24 hr tablet, Take 500 mg by mouth 2 (two) times daily with a meal. , Disp: , Rfl:  omega-3 acid ethyl esters (LOVAZA) 1 G capsule, Take 2 g by mouth  2 (two) times daily.  , Disp: , Rfl: ;  omeprazole (PRILOSEC) 20 MG capsule, Take 40 mg by mouth daily. , Disp: , Rfl:   Allergies:  Allergies  Allergen Reactions  . Other     Became hypotensive. Pt states allergic to "muscle relaxer" but unsure of name.  . Tape     medfix non woven tape    Past Medical History, Surgical history, Social history, and Family History were reviewed and updated.  Review of Systems: Constitutional:  Negative for fever, chills, night sweats, anorexia, weight loss, pain. Cardiovascular: no chest pain or dyspnea on exertion Respiratory: no cough, shortness of breath, or wheezing Neurological: no TIA or stroke symptoms Dermatological: negative ENT: negative Skin: Negative. Gastrointestinal: no abdominal pain, change in bowel habits, or black or bloody stools Genito-Urinary: no dysuria, trouble voiding, or hematuria Hematological and Lymphatic: negative Breast: negative for breast lumps Musculoskeletal: positive for - gait disturbance Remaining ROS negative.  Physical Exam: Blood pressure 145/78, pulse 91, temperature 98.2 F (36.8 C), resp. rate 20, height 5\' 9"  (1.753 m), weight 181 lb 12.8 oz (82.464 kg). ECOG:  General appearance: alert, cooperative and no distress Head: Normocephalic, without obvious abnormality, atraumatic Neck: no adenopathy, no carotid bruit, no JVD, supple, symmetrical, trachea midline and thyroid not enlarged, symmetric, no tenderness/mass/nodules Lymph nodes: Cervical, supraclavicular, and axillary nodes normal. Heart:regular rate and rhythm, S1, S2 normal, no murmur, click, rub or gallop Lung:chest clear, no wheezing, rales, normal symmetric air entry,  no tachypnea, retractions or cyanosis Abdomen: soft, non-tender, without masses or organomegaly EXT:no erythema, induration, or nodules   Lab Results: Lab Results  Component Value Date   WBC 6.3 05/19/2012   HGB 12.8* 05/19/2012   HCT 38.5 05/19/2012   MCV 90.6 05/19/2012    PLT 264 05/19/2012     Chemistry      Component Value Date/Time   NA 138 05/19/2012 1427   NA 139 02/04/2012 0520   NA 147* 10/12/2011 0959   K 4.9 05/19/2012 1427   K 3.5 02/04/2012 0520   K 5.2* 10/12/2011 0959   CL 102 05/19/2012 1427   CL 105 02/04/2012 0520   CL 102 10/12/2011 0959   CO2 25 05/19/2012 1427   CO2 26 02/04/2012 0520   CO2 29 10/12/2011 0959   BUN 19.0 05/19/2012 1427   BUN 12 02/04/2012 0520   BUN 13 10/12/2011 0959   CREATININE 1.5* 05/19/2012 1427   CREATININE 0.93 02/04/2012 0520   CREATININE 1.0 10/12/2011 0959      Component Value Date/Time   CALCIUM 9.8 05/19/2012 1427   CALCIUM 8.9 02/04/2012 0520   CALCIUM 9.0 10/12/2011 0959   ALKPHOS 99 05/19/2012 1427   ALKPHOS 61 02/01/2012 0630   ALKPHOS 90* 10/12/2011 0959   AST 32 05/19/2012 1427   AST 62* 02/01/2012 0630   AST 51* 10/12/2011 0959   ALT 30 05/19/2012 1427   ALT 24 02/01/2012 0630   BILITOT 0.33 05/19/2012 1427   BILITOT 0.5 02/01/2012 0630   BILITOT 0.50 10/12/2011 0959       Radiological Studies:  *RADIOLOGY REPORT*  Clinical Data: Anal cancer.  CT ABDOMEN AND PELVIS WITH CONTRAST  Technique: Multidetector CT imaging of the abdomen and pelvis was  performed following the standard protocol during bolus  administration of intravenous contrast.  Contrast: OMNIPAQUE IOHEXOL 300 MG/ML SOLN  Comparison: CT of the abdomen and pelvis 10/12/2011.  Findings:  Lung Bases: Unremarkable.  Abdomen/Pelvis: Ill-defined 2.1 x 1.1 cm lesion in segment 8 of  the liver adjacent to the hepatic dome is unchanged compared to  prior studies. No new hepatic lesions are noted. Calcified  gallstones in the gallbladder. No signs of acute cholecystitis at  this time. The appearance of the pancreas, spleen, bilateral  adrenal glands and bilateral kidneys is unremarkable. There is  extensive atherosclerosis of the abdominal and pelvic vasculature,  including a large amount of ulcerated plaque in the infrarenal  abdominal  aorta. Overlying the left abdominal wall there is a  bypass graft which extends to the left common femoral artery.  There is a fem-fem bypass graft in place. Notably, the superficial  femoral arteries do not appear to opacify with contrast  bilaterally, suggesting occlusion. Profunda femoral arteries are  both patent.  Normal appendix. No ascites or pneumoperitoneum and no pathologic  distension of small bowel. Calcification in the low pelvis (image  76 of series 2) likely reflects sequelae of fat necrosis or prior  appendagitis epiploicae. This is unchanged. No definite  pathologic lymphadenopathy identified within the abdomen or pelvis.  No definite soft tissue mass in the distal rectum or anus to  strongly suggest local recurrence of disease.  Musculoskeletal: There are no aggressive appearing lytic or blastic  lesions noted in the visualized portions of the skeleton.  IMPRESSION:  1. No findings to suggest the presence of residual or metastatic  disease in the abdomen or pelvis.  2. Small irregular shaped lesion in the hepatic dome (segment  8)  is unchanged compared to prior examinations and compatible with a  treated hepatic lesion (status post microwave ablation).  3. Extensive atherosclerosis, as above, including a large burden  of ulcerated plaque in the infrarenal abdominal aorta. There  appears to be a left axillary-femoral bypass graft (the proximal  aspect of the graft is incompletely visualized). Notably, the  superficial femoral arteries fail to opacify with contrast  bilaterally suggesting occlusion. Proximal profunda femoral  arteries are patent bilaterally.  4. Normal appendix.  5. Cholelithiasis without findings to suggest acute cholecystitis.  Original Report Authenticated By: Trudie Reed, M.D.   *RADIOLOGY REPORT*  Clinical Data: Anal cancer.  CHEST - 2 VIEW  Comparison: 01/23/2012  Findings: Power port in place. The heart size and pulmonary  vascularity  are normal and the lungs are clear. No osseous  abnormality.  IMPRESSION:  No significant abnormality.  Original Report Authenticated By: Francene Boyers, M.D.   Impression and Plan: This is a 67 year old gentleman with the following issues:  1. Metastatic anal cancer. S/P chemo followed by microwave ablation to a solitary liver met. Labs, CXR, and CT scan reviewed with the patient today. Discussed that scans remains stable without evidence of recurrent disease at this time. Recommend continued observation. He will have a CT scan in 6 months. CXR annually.  2. PAC. I have set him up for flushes avery 6-8 weeks.  3. Hx of CVA in 2008. Has residual left hemiparesis. He is followed by Neurology.   4. DM. On Metformin per PCP.  5. Follow-up. In 6 months.  Case reviewed with Dr Clelia Croft. Spent more than half the time coordinating care.    Clenton Pare 2/6/20142:31 PM

## 2012-06-25 ENCOUNTER — Ambulatory Visit: Payer: Medicare Other | Admitting: Occupational Therapy

## 2012-06-25 ENCOUNTER — Ambulatory Visit: Payer: Medicare Other | Attending: Neurology | Admitting: Physical Therapy

## 2012-06-25 DIAGNOSIS — R269 Unspecified abnormalities of gait and mobility: Secondary | ICD-10-CM | POA: Insufficient documentation

## 2012-06-25 DIAGNOSIS — Z5189 Encounter for other specified aftercare: Secondary | ICD-10-CM | POA: Insufficient documentation

## 2012-06-25 DIAGNOSIS — M6281 Muscle weakness (generalized): Secondary | ICD-10-CM | POA: Insufficient documentation

## 2012-07-01 ENCOUNTER — Ambulatory Visit: Payer: Medicare Other | Admitting: Physical Therapy

## 2012-07-04 ENCOUNTER — Ambulatory Visit: Payer: Medicare Other | Admitting: Physical Therapy

## 2012-07-08 ENCOUNTER — Ambulatory Visit: Payer: Medicare Other | Attending: Neurology | Admitting: Physical Therapy

## 2012-07-08 DIAGNOSIS — Z5189 Encounter for other specified aftercare: Secondary | ICD-10-CM | POA: Insufficient documentation

## 2012-07-08 DIAGNOSIS — R269 Unspecified abnormalities of gait and mobility: Secondary | ICD-10-CM | POA: Insufficient documentation

## 2012-07-08 DIAGNOSIS — M6281 Muscle weakness (generalized): Secondary | ICD-10-CM | POA: Insufficient documentation

## 2012-07-10 ENCOUNTER — Ambulatory Visit: Payer: Medicare Other | Admitting: Physical Therapy

## 2012-07-16 ENCOUNTER — Ambulatory Visit: Payer: Medicare Other | Admitting: Occupational Therapy

## 2012-07-16 ENCOUNTER — Ambulatory Visit: Payer: Medicare Other | Admitting: Physical Therapy

## 2012-07-17 ENCOUNTER — Telehealth: Payer: Self-pay | Admitting: Oncology

## 2012-07-17 NOTE — Telephone Encounter (Signed)
returned pt call and r/s missed flush

## 2012-07-18 ENCOUNTER — Ambulatory Visit: Payer: Medicare Other | Admitting: Physical Therapy

## 2012-07-21 ENCOUNTER — Ambulatory Visit (HOSPITAL_BASED_OUTPATIENT_CLINIC_OR_DEPARTMENT_OTHER): Payer: Medicare Other

## 2012-07-21 VITALS — BP 157/51 | HR 93 | Temp 98.1°F

## 2012-07-21 DIAGNOSIS — C787 Secondary malignant neoplasm of liver and intrahepatic bile duct: Secondary | ICD-10-CM

## 2012-07-21 DIAGNOSIS — C210 Malignant neoplasm of anus, unspecified: Secondary | ICD-10-CM

## 2012-07-21 DIAGNOSIS — Z452 Encounter for adjustment and management of vascular access device: Secondary | ICD-10-CM

## 2012-07-21 MED ORDER — HEPARIN SOD (PORK) LOCK FLUSH 100 UNIT/ML IV SOLN
500.0000 [IU] | Freq: Once | INTRAVENOUS | Status: AC
  Administered 2012-07-21: 500 [IU] via INTRAVENOUS
  Filled 2012-07-21: qty 5

## 2012-07-21 MED ORDER — SODIUM CHLORIDE 0.9 % IJ SOLN
10.0000 mL | INTRAMUSCULAR | Status: DC | PRN
  Administered 2012-07-21: 10 mL via INTRAVENOUS
  Filled 2012-07-21: qty 10

## 2012-07-22 ENCOUNTER — Ambulatory Visit: Payer: Medicare Other | Admitting: Physical Therapy

## 2012-07-22 ENCOUNTER — Ambulatory Visit: Payer: Medicare Other | Admitting: Occupational Therapy

## 2012-07-24 ENCOUNTER — Ambulatory Visit: Payer: Medicare Other | Admitting: Occupational Therapy

## 2012-07-24 ENCOUNTER — Ambulatory Visit: Payer: Medicare Other | Admitting: Physical Therapy

## 2012-07-29 ENCOUNTER — Ambulatory Visit: Payer: Medicare Other | Attending: Neurology | Admitting: Physical Therapy

## 2012-07-29 ENCOUNTER — Ambulatory Visit: Payer: Medicare Other | Admitting: Occupational Therapy

## 2012-07-29 DIAGNOSIS — R269 Unspecified abnormalities of gait and mobility: Secondary | ICD-10-CM | POA: Insufficient documentation

## 2012-07-29 DIAGNOSIS — M6281 Muscle weakness (generalized): Secondary | ICD-10-CM | POA: Insufficient documentation

## 2012-07-29 DIAGNOSIS — Z5189 Encounter for other specified aftercare: Secondary | ICD-10-CM | POA: Insufficient documentation

## 2012-07-31 ENCOUNTER — Ambulatory Visit: Payer: Medicare Other | Admitting: Physical Therapy

## 2012-07-31 ENCOUNTER — Ambulatory Visit: Payer: Medicare Other | Admitting: Occupational Therapy

## 2012-08-06 ENCOUNTER — Ambulatory Visit: Payer: Medicare Other | Admitting: *Deleted

## 2012-08-06 ENCOUNTER — Ambulatory Visit: Payer: Medicare Other | Admitting: Physical Therapy

## 2012-08-08 ENCOUNTER — Ambulatory Visit: Payer: Medicare Other | Admitting: Physical Therapy

## 2012-08-08 ENCOUNTER — Ambulatory Visit: Payer: Medicare Other | Admitting: *Deleted

## 2012-08-12 ENCOUNTER — Ambulatory Visit: Payer: Medicare Other | Admitting: Physical Therapy

## 2012-08-14 ENCOUNTER — Ambulatory Visit: Payer: Medicare Other | Admitting: Physical Therapy

## 2012-08-18 ENCOUNTER — Ambulatory Visit: Payer: Medicare Other | Admitting: Physical Therapy

## 2012-08-18 ENCOUNTER — Encounter: Payer: Self-pay | Admitting: Physical Therapy

## 2012-08-20 ENCOUNTER — Ambulatory Visit: Payer: Medicare Other | Admitting: Occupational Therapy

## 2012-08-20 ENCOUNTER — Ambulatory Visit: Payer: Medicare Other | Admitting: Physical Therapy

## 2012-08-25 ENCOUNTER — Ambulatory Visit: Payer: Medicare Other | Admitting: Physical Therapy

## 2012-08-26 NOTE — Progress Notes (Signed)
Error

## 2012-08-27 ENCOUNTER — Encounter: Payer: Self-pay | Admitting: Vascular Surgery

## 2012-08-27 ENCOUNTER — Ambulatory Visit: Payer: Medicare Other | Admitting: Physical Therapy

## 2012-08-28 ENCOUNTER — Encounter (INDEPENDENT_AMBULATORY_CARE_PROVIDER_SITE_OTHER): Payer: Medicare Other | Admitting: Vascular Surgery

## 2012-08-28 ENCOUNTER — Other Ambulatory Visit: Payer: Medicare Other

## 2012-08-28 ENCOUNTER — Encounter: Payer: Self-pay | Admitting: Vascular Surgery

## 2012-08-28 ENCOUNTER — Ambulatory Visit (INDEPENDENT_AMBULATORY_CARE_PROVIDER_SITE_OTHER): Payer: Medicare Other | Admitting: Vascular Surgery

## 2012-08-28 VITALS — BP 127/60 | HR 87 | Ht 69.0 in | Wt 186.9 lb

## 2012-08-28 DIAGNOSIS — Z48812 Encounter for surgical aftercare following surgery on the circulatory system: Secondary | ICD-10-CM

## 2012-08-28 DIAGNOSIS — I739 Peripheral vascular disease, unspecified: Secondary | ICD-10-CM

## 2012-08-28 DIAGNOSIS — L98499 Non-pressure chronic ulcer of skin of other sites with unspecified severity: Secondary | ICD-10-CM

## 2012-08-28 NOTE — Addendum Note (Signed)
Addended by: Adria Dill L on: 08/28/2012 03:23 PM   Modules accepted: Orders

## 2012-08-28 NOTE — Progress Notes (Signed)
Patient is a 67 year old male who underwent axillary bifemoral bypass October 1. He had some trouble healing his left groin incision. He also had an ulceration on his left foot. He returns for followup today. He has bilateral lower extremity weakness but overall since he feels better. He reports no claudication in his left lower extremity. He denies rest pain. He is able to ambulate somewhat a cane. He is still undergoing physical therapy. Unfortunately he continues to smoke 3 cigarettes per day. He was counseled again greater than 3 minutes today regarding this.  Review of systems: Develops shortness of breath with exertion. Denies chest pain.  Physical exam:  Filed Vitals:   08/28/12 1411  BP: 127/60  Pulse: 87  Height: 5\' 9"  (1.753 m)  Weight: 186 lb 14.4 oz (84.777 kg)  SpO2: 95%   Abdomen: Slightly obese soft nontender nondistended audible graft Doppler flow in fem-fem as well as axfem component Extremities: No palpable pulses in his feet bilaterally left foot toes one and 2 slightly dusky but with capillary refill, no open ulcers  Data: Patient had bilateral ABIs today which I reviewed and interpreted. ABI on the right was 0.5 to left was 0.39 these are similar to his prior ABIs.  Assessment: Patent axillary bifemoral bypass with bilateral marginal lower extremity flow all the patient is not a very good candidate for a lower extremity bypass.    Plan: Continue conservative management for now with close observation.  He will followup in 3 months time with a duplex of his axbifem as well as lower extremity ABIs.  Fabienne Bruns, MD [facility] [place] Office: [phone] Pager: [phone]

## 2012-08-29 NOTE — Addendum Note (Signed)
Addended by: Adria Dill L on: 08/29/2012 03:05 PM   Modules accepted: Orders

## 2012-09-03 ENCOUNTER — Encounter: Payer: Self-pay | Admitting: Nurse Practitioner

## 2012-09-03 ENCOUNTER — Ambulatory Visit (INDEPENDENT_AMBULATORY_CARE_PROVIDER_SITE_OTHER): Payer: Medicare Other | Admitting: Nurse Practitioner

## 2012-09-03 VITALS — BP 136/80 | HR 90 | Ht 69.0 in | Wt 187.0 lb

## 2012-09-03 DIAGNOSIS — R269 Unspecified abnormalities of gait and mobility: Secondary | ICD-10-CM

## 2012-09-03 DIAGNOSIS — M216X9 Other acquired deformities of unspecified foot: Secondary | ICD-10-CM

## 2012-09-03 DIAGNOSIS — Z8679 Personal history of other diseases of the circulatory system: Secondary | ICD-10-CM

## 2012-09-03 NOTE — Progress Notes (Signed)
HPI: He returns for followup after last visit with Dr. Pearlean Brownie on 06/04/2012. 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 He also has a history of left foot pain  with baseline peripheral neuropathy due to his diabetes. He has had increasing weakness in the right leg following a femoral popliteal bypass several months ago,  he is currently on Neurontin which helps with his lower extremity paresthesias. His blood pressure is well controlled.  His fasting sugars are up and down. He is currently in physical and occupational therapy and has about 5 weeks left. He finds this beneficial.He has not had further stroke or TIA symptoms.  ROS:  - weight gain, swelling in legs, easy bruising, numbness  Physical Exam General: well developed, well nourished, seated, in no evident distress Head: head normocephalic and atraumatic. Oropharynx benign Neck: supple with no carotid or supraclavicular bruits Cardiovascular: regular rate and rhythm, no murmurs Skin: 1+ edema of both lower extremities  Neurologic Exam Mental Status: Awake and fully alert. Oriented to place and time. Speech and language are normal   Cranial Nerves: Fundoscopic exam reveals sharp disc margins. Pupils equal, briskly reactive to light. Extraocular movements full without nystagmus. Visual fields full to confrontation. Hearing intact and symmetric to finger snap.  Face with mild left lower facial weakness., Tongue, palate move normally and symmetrically. Neck flexion and extension normal.  Motor: Left hemiparesis with 2/5 shoulder 0/5 left hand 4/5 left leg, diminished fine finger movements on the left. Good strength on the right except for right foot drop, patient does not wear AFO Sensory.: Touch, pinprick, vibratory and position sense are diminished from ankle down   Coordination: Rapid alternating movements normal on the right  Gait and Station: Arises from chair with out difficulty.  Stiffness of the left leg foot drop evident ambulates with a cane   Reflexes: 2+ and symmetric. Toes downgoing.     ASSESSMENT: History of stroke in 2008 with residual left hemiparesis worsening gait after fem-popliteal bypass graft with new foot drop. Repeat MRI of the brain did not show anything acute. Paresthesias likely due to diabetic neuropathy     PLAN: Continue gabapentin 300 mg 3 times daily. Copy of MRI of the brain given Continue outpatient therapies for balance and gait training Control of blood pressure with systolic below 130 Hemoglobin Z6X at  6.5 or below LDL cholesterol below 100 Continue Aggrenox for secondary stroke prevention  Nilda Riggs, GNP-BC APRN

## 2012-09-03 NOTE — Patient Instructions (Addendum)
Continue gabapentin 300 mg 3 times daily. Copy of MRI of the brain given Continue outpatient therapies for balance and gait training Control of blood pressure with systolic below 130 Hemoglobin N5A at  6.5 or below LDL cholesterol below 100

## 2012-09-14 ENCOUNTER — Other Ambulatory Visit: Payer: Self-pay

## 2012-09-14 MED ORDER — ASPIRIN-DIPYRIDAMOLE ER 25-200 MG PO CP12: 1.0000 | ORAL_CAPSULE | Freq: Two times a day (BID) | ORAL | Status: DC

## 2012-09-15 ENCOUNTER — Ambulatory Visit (HOSPITAL_BASED_OUTPATIENT_CLINIC_OR_DEPARTMENT_OTHER): Payer: Medicare Other

## 2012-09-15 VITALS — BP 160/75 | HR 103 | Temp 97.5°F

## 2012-09-15 DIAGNOSIS — Z452 Encounter for adjustment and management of vascular access device: Secondary | ICD-10-CM

## 2012-09-15 DIAGNOSIS — C210 Malignant neoplasm of anus, unspecified: Secondary | ICD-10-CM

## 2012-09-15 DIAGNOSIS — C187 Malignant neoplasm of sigmoid colon: Secondary | ICD-10-CM

## 2012-09-15 DIAGNOSIS — C787 Secondary malignant neoplasm of liver and intrahepatic bile duct: Secondary | ICD-10-CM

## 2012-09-15 MED ORDER — SODIUM CHLORIDE 0.9 % IJ SOLN
10.0000 mL | INTRAMUSCULAR | Status: DC | PRN
  Administered 2012-09-15: 10 mL via INTRAVENOUS
  Filled 2012-09-15: qty 10

## 2012-09-15 MED ORDER — HEPARIN SOD (PORK) LOCK FLUSH 100 UNIT/ML IV SOLN
500.0000 [IU] | Freq: Once | INTRAVENOUS | Status: AC
  Administered 2012-09-15: 500 [IU] via INTRAVENOUS
  Filled 2012-09-15: qty 5

## 2012-09-15 NOTE — Patient Instructions (Signed)
Call MD for problems or concerns 

## 2012-10-01 ENCOUNTER — Other Ambulatory Visit: Payer: Self-pay

## 2012-10-01 MED ORDER — GABAPENTIN 300 MG PO CAPS: 300.0000 mg | ORAL_CAPSULE | Freq: Three times a day (TID) | ORAL | Status: DC

## 2012-10-06 ENCOUNTER — Encounter (INDEPENDENT_AMBULATORY_CARE_PROVIDER_SITE_OTHER): Payer: Self-pay | Admitting: Surgery

## 2012-10-06 ENCOUNTER — Ambulatory Visit (INDEPENDENT_AMBULATORY_CARE_PROVIDER_SITE_OTHER): Payer: Medicare Other | Admitting: Surgery

## 2012-10-06 VITALS — BP 104/60 | HR 85 | Temp 98.5°F | Resp 17 | Ht 69.0 in | Wt 187.4 lb

## 2012-10-06 DIAGNOSIS — Z85048 Personal history of other malignant neoplasm of rectum, rectosigmoid junction, and anus: Secondary | ICD-10-CM

## 2012-10-06 NOTE — Progress Notes (Signed)
Subjective:     Patient ID: Donald [name], male   DOB: 06-04-1945, 67 y.o.   MRN: [id number]  HPIPatient returns for 6 month followup due to history of anal canal carcinoma. He underwent chemotherapy radiation therapy back in 2012 but developed a liver metastasis treated by radiofrequency ablation in July of 2012. He has completed chemotherapy. He continues to have some complaints of rectal and anal canal pain. He does have some occasional bleeding. Underwent angioplasty of  Right iliac artery recently. Legs feel better. Had a fem pop bypass earlier this year.  Review of Systems  Respiratory: Negative.   Cardiovascular: Negative.   Gastrointestinal: Positive for anal bleeding and rectal pain.  Psychiatric/Behavioral: Positive for dysphoric mood.       Objective:   Physical Exam  Constitutional: He appears well-developed and well-nourished.  Eyes: EOM are normal. Pupils are equal, round, and reactive to light.  Genitourinary:   Anal canal atrophic but no evidence of recurrence  Skin: Skin is warm and dry.       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

## 2012-10-06 NOTE — Patient Instructions (Signed)
Return 1 year.  Call Dr Marina Goodell for question on colonoscopy.

## 2012-11-17 ENCOUNTER — Ambulatory Visit (HOSPITAL_BASED_OUTPATIENT_CLINIC_OR_DEPARTMENT_OTHER): Payer: Medicare Other

## 2012-11-17 VITALS — BP 161/70 | HR 88 | Temp 96.2°F | Resp 22

## 2012-11-17 DIAGNOSIS — Z452 Encounter for adjustment and management of vascular access device: Secondary | ICD-10-CM

## 2012-11-17 DIAGNOSIS — C210 Malignant neoplasm of anus, unspecified: Secondary | ICD-10-CM

## 2012-11-17 MED ORDER — SODIUM CHLORIDE 0.9 % IJ SOLN
10.0000 mL | INTRAMUSCULAR | Status: DC | PRN
  Administered 2012-11-17: 10 mL via INTRAVENOUS
  Filled 2012-11-17: qty 10

## 2012-11-17 MED ORDER — HEPARIN SOD (PORK) LOCK FLUSH 100 UNIT/ML IV SOLN
500.0000 [IU] | Freq: Once | INTRAVENOUS | Status: AC
  Administered 2012-11-17: 500 [IU] via INTRAVENOUS
  Filled 2012-11-17: qty 5

## 2012-12-01 ENCOUNTER — Other Ambulatory Visit (HOSPITAL_BASED_OUTPATIENT_CLINIC_OR_DEPARTMENT_OTHER): Payer: Medicare Other

## 2012-12-01 ENCOUNTER — Encounter (HOSPITAL_COMMUNITY): Payer: Self-pay

## 2012-12-01 ENCOUNTER — Ambulatory Visit (HOSPITAL_COMMUNITY)
Admission: RE | Admit: 2012-12-01 | Discharge: 2012-12-01 | Disposition: A | Discharge status: Home / Self Care | Payer: Medicare Other | Source: Ambulatory Visit | Attending: Oncology | Admitting: Oncology

## 2012-12-01 DIAGNOSIS — I745 Embolism and thrombosis of iliac artery: Secondary | ICD-10-CM | POA: Insufficient documentation

## 2012-12-01 DIAGNOSIS — C210 Malignant neoplasm of anus, unspecified: Secondary | ICD-10-CM | POA: Insufficient documentation

## 2012-12-01 DIAGNOSIS — C787 Secondary malignant neoplasm of liver and intrahepatic bile duct: Secondary | ICD-10-CM | POA: Insufficient documentation

## 2012-12-01 DIAGNOSIS — K7689 Other specified diseases of liver: Secondary | ICD-10-CM | POA: Insufficient documentation

## 2012-12-01 DIAGNOSIS — I2510 Atherosclerotic heart disease of native coronary artery without angina pectoris: Secondary | ICD-10-CM | POA: Insufficient documentation

## 2012-12-01 DIAGNOSIS — Z9221 Personal history of antineoplastic chemotherapy: Secondary | ICD-10-CM | POA: Insufficient documentation

## 2012-12-01 LAB — COMPREHENSIVE METABOLIC PANEL (CC13)
ALT: 31 U/L (ref 0–55)
AST: 37 U/L — ABNORMAL HIGH (ref 5–34)
Albumin: 3.6 g/dL (ref 3.5–5.0)
Alkaline Phosphatase: 85 U/L (ref 40–150)
Calcium: 9.9 mg/dL (ref 8.4–10.4)
Chloride: 103 mEq/L (ref 98–109)
Creatinine: 1.4 mg/dL — ABNORMAL HIGH (ref 0.7–1.3)
Potassium: 5 mEq/L (ref 3.5–5.1)

## 2012-12-01 LAB — CBC WITH DIFFERENTIAL/PLATELET
BASO%: 1.1 % (ref 0.0–2.0)
EOS%: 1.7 % (ref 0.0–7.0)
MCH: 25.9 pg — ABNORMAL LOW (ref 27.2–33.4)
MCHC: 32.2 g/dL (ref 32.0–36.0)
MCV: 80.5 fL (ref 79.3–98.0)
MONO%: 10.7 % (ref 0.0–14.0)
RDW: 16.1 % — ABNORMAL HIGH (ref 11.0–14.6)
lymph#: 1.8 10*3/uL (ref 0.9–3.3)

## 2012-12-01 MED ORDER — IOHEXOL 300 MG/ML  SOLN
100.0000 mL | Freq: Once | INTRAMUSCULAR | Status: AC | PRN
  Administered 2012-12-01: 100 mL via INTRAVENOUS

## 2012-12-03 ENCOUNTER — Telehealth: Payer: Self-pay | Admitting: Oncology

## 2012-12-03 ENCOUNTER — Ambulatory Visit (HOSPITAL_BASED_OUTPATIENT_CLINIC_OR_DEPARTMENT_OTHER): Payer: Medicare Other | Admitting: Oncology

## 2012-12-03 VITALS — BP 148/54 | HR 91 | Temp 98.1°F | Resp 20 | Ht 69.0 in | Wt 187.4 lb

## 2012-12-03 DIAGNOSIS — C210 Malignant neoplasm of anus, unspecified: Secondary | ICD-10-CM

## 2012-12-03 NOTE — Telephone Encounter (Signed)
gv and printed appt sched and avs for pt....gv pt barium   °

## 2012-12-03 NOTE — Progress Notes (Signed)
Hematology and Oncology Follow Up Visit  RAMAN FEATHERSTON [id number] Aug 12, 1945 67 y.o. 12/03/2012 1:44 PM Evlyn Courier, MDHill, Earvin Hansen, MD   Principle Diagnosis: 67 year old with anal cancer with liver metastases diagnosed in 2012. Tumor was poorly differentiated and had a solitary liver met. He has stage IV NED.   Prior Therapy: 1. S/P 3 cycles of Cisplatin and 5-FU 08/2010 through 10/2010. 2. S/P percutaneous microwave ablation to the liver met in 01/2011.  Current therapy: Watchful observation  Interim History:  Mr Bukhari is seen today for routine follow-up with his wife. States he underwent a fem-pop bypass in October 2013 and has slowly recovered from this. Still having lower extremity weakness. He is able to ambulate with the assistance of a cane. He had a CVA in 2008 and has residual left arm hemiparesis. Reports appetite is good. Weight is up by 7 lbs compared to last visit 6 months ago. Denies abdominal pain, nausea, vomiting. No bleeding noted. No change in his bowel or bladder habits.  No new hospitalizations or illnesses.   Medications: I have reviewed the patient's current medications.  Current Outpatient Prescriptions  Medication Sig Dispense Refill  . acidophilus (RISAQUAD) CAPS Take by mouth daily.        . Ascorbic Acid (VITAMIN C) 1000 MG tablet Take 1,000 mg by mouth daily.      Marland Kitchen buPROPion (ZYBAN) 150 MG 12 hr tablet Take 150 mg by mouth daily.       Marland Kitchen dipyridamole-aspirin (AGGRENOX) 200-25 MG per 12 hr capsule Take 1 capsule by mouth 2 (two) times daily.  180 capsule  1  . docusate sodium (COLACE) 100 MG capsule Take 200 mg by mouth 2 (two) times daily.      Marland Kitchen gabapentin (NEURONTIN) 300 MG capsule Take 1 capsule (300 mg total) by mouth 3 (three) times daily.  90 capsule  3  . lisinopril (PRINIVIL,ZESTRIL) 5 MG tablet Take 0.5 tablets (2.5 mg total) by mouth daily.      . metFORMIN (GLUMETZA) 500 MG (MOD) 24 hr tablet Take 500 mg by mouth 2 (two) times daily with a  meal.       . omega-3 acid ethyl esters (LOVAZA) 1 G capsule Take 2 g by mouth 2 (two) times daily.        Marland Kitchen omeprazole (PRILOSEC) 20 MG capsule Take 40 mg by mouth daily.       Marland Kitchen OVER THE COUNTER MEDICATION        No current facility-administered medications for this visit.    Allergies:  Allergies  Allergen Reactions  . Other     Became hypotensive. Pt states allergic to "muscle relaxer" but unsure of name.  . Tape     medfix non woven tape    Past Medical History, Surgical history, Social history, and Family History were reviewed and updated.  Review of Systems: Constitutional:  Negative for fever, chills, night sweats, anorexia, weight loss, pain. Cardiovascular: no chest pain or dyspnea on exertion Respiratory: no cough, shortness of breath, or wheezing Neurological: no TIA or stroke symptoms Dermatological: negative ENT: negative Skin: Negative. Gastrointestinal: no abdominal pain, change in bowel habits, or black or bloody stools Genito-Urinary: no dysuria, trouble voiding, or hematuria Hematological and Lymphatic: negative Breast: negative for breast lumps Musculoskeletal: positive for - gait disturbance Remaining ROS negative.  Physical Exam: Blood pressure 148/54, pulse 91, temperature 98.1 F (36.7 C), temperature source Oral, resp. rate 20, height 5\' 9"  (1.753 m), weight 187 lb 6.4  oz (85.004 kg). ECOG: 1 General appearance: alert, cooperative and no distress Head: Normocephalic, without obvious abnormality, atraumatic Neck: no adenopathy, no carotid bruit, no JVD, supple, symmetrical, trachea midline and thyroid not enlarged, symmetric, no tenderness/mass/nodules Lymph nodes: Cervical, supraclavicular, and axillary nodes normal. Heart:regular rate and rhythm, S1, S2 normal, no murmur, click, rub or gallop Lung:chest clear, no wheezing, rales, normal symmetric air entry, no tachypnea, retractions or cyanosis Abdomen: soft, non-tender, without masses or  organomegaly EXT:no erythema, induration, or nodules   Lab Results: Lab Results  Component Value Date   WBC 6.3 12/01/2012   HGB 10.0* 12/01/2012   HCT 31.1* 12/01/2012   MCV 80.5 12/01/2012   PLT 317 12/01/2012     Chemistry      Component Value Date/Time   NA 138 12/01/2012 1008   NA 139 02/04/2012 0520   NA 147* 10/12/2011 0959   K 5.0 12/01/2012 1008   K 3.5 02/04/2012 0520   K 5.2* 10/12/2011 0959   CL 102 05/19/2012 1427   CL 105 02/04/2012 0520   CL 102 10/12/2011 0959   CO2 23 12/01/2012 1008   CO2 26 02/04/2012 0520   CO2 29 10/12/2011 0959   BUN 13.9 12/01/2012 1008   BUN 12 02/04/2012 0520   BUN 13 10/12/2011 0959   CREATININE 1.4* 12/01/2012 1008   CREATININE 0.93 02/04/2012 0520   CREATININE 1.0 10/12/2011 0959      Component Value Date/Time   CALCIUM 9.9 12/01/2012 1008   CALCIUM 8.9 02/04/2012 0520   CALCIUM 9.0 10/12/2011 0959   ALKPHOS 85 12/01/2012 1008   ALKPHOS 61 02/01/2012 0630   ALKPHOS 90* 10/12/2011 0959   AST 37* 12/01/2012 1008   AST 62* 02/01/2012 0630   AST 51* 10/12/2011 0959   ALT 31 12/01/2012 1008   ALT 24 02/01/2012 0630   ALT 45 10/12/2011 0959   BILITOT 0.36 12/01/2012 1008   BILITOT 0.5 02/01/2012 0630   BILITOT 0.50 10/12/2011 0959       CT ABDOMEN AND PELVIS WITH CONTRAST  Technique: Multidetector CT imaging of the abdomen and pelvis was  performed following the standard protocol during bolus  administration of intravenous contrast.  Contrast: OMNIPAQUE IOHEXOL 300 MG/ML SOLN  Comparison: CT of the abdomen and pelvis 05/19/2012.  Findings:  Lung Bases: Atherosclerotic calcifications in the mid right  coronary artery. Otherwise, unremarkable.  Abdomen/Pelvis: Again noted is an irregular shaped 2.2 x 1.0 cm  lesion in segment 8 of the liver adjacent to the dome which is  unchanged, and compatible with a treated metastatic lesion (status  post microwave ablation). No other hepatic lesions are noted.  Multiple calcified gallstones layering dependently in the   gallbladder. No current findings to suggest acute cholecystitis at  this time. The appearance of the pancreas, spleen, bilateral  adrenal glands and bilateral kidneys is unremarkable.  Extensive atherosclerosis throughout the abdominal and pelvic  vasculature is again noted, including a large volume of ulcerated  atheromatous plaque throughout the infrarenal abdominal aorta.  Compared to the prior examination there is now complete occlusion  of the left common iliac, external iliac and internal iliac  arteries. There is also likely high-grade stenosis at the origin  of the right external iliac artery and multifocal stenosis in the  distal right external iliac artery. Postoperative changes of a  left axillary bifemoral bypass graft are noted. There is also  either high-grade stenosis or complete occlusion of the proximal  left superficial femoral artery as  well.  The anus and perianal soft tissues appear within normal limits on  today's examination. There is no significant volume of ascites, no  pneumoperitoneum and no pathologic distension of small bowel.  Normal appendix. No definite pathologic lymphadenopathy identified  on today's examination. Prostate gland and urinary bladder are  unremarkable in appearance. Tiny umbilical hernia containing only  a small amount of omental fat incidentally noted.  Musculoskeletal: There are no aggressive appearing lytic or blastic  lesions noted in the visualized portions of the skeleton.  IMPRESSION:  1. No definite findings to suggest new metastatic disease in the  abdomen or pelvis on today's examination.  2. Stable appearance of the lesion in segment 8 of the liver  adjacent to the dome of the liver, compatible with a treated  metastasis (status post microwave ablation).  3. Severe atherosclerosis in this patient status post left  axillary bifemoral bypass graft, with new complete occlusion of the  left common iliac, external iliac and  internal iliac arteries, as  well as multiple other vascular findings discussed above.    Impression and Plan: This is a 67 year old gentleman with the following issues:  1. Metastatic anal cancer. S/P chemo followed by microwave ablation to a solitary liver met. Labs, and CT scan from 12/01/2012 reviewed with the patient today. Discussed that scans remains stable without evidence of recurrent disease at this time. Recommend continued observation. He will have a CT scan and CXR in 12 months.  2. PAC. I have set him up for flushes avery 6-8 weeks.If his CT scan is clear in 6 months, we will remove port then.   3. Hx of CVA in 2008. Has residual left hemiparesis. He is followed by Neurology.   4. DM. On Metformin per PCP.  5. Follow-up. In 6 months.     Coen Miyasato 8/6/20141:44 PM

## 2012-12-17 ENCOUNTER — Encounter: Payer: Self-pay | Admitting: Vascular Surgery

## 2012-12-18 ENCOUNTER — Encounter (INDEPENDENT_AMBULATORY_CARE_PROVIDER_SITE_OTHER): Payer: Medicare Other | Admitting: *Deleted

## 2012-12-18 ENCOUNTER — Encounter: Payer: Self-pay | Admitting: Vascular Surgery

## 2012-12-18 ENCOUNTER — Ambulatory Visit (INDEPENDENT_AMBULATORY_CARE_PROVIDER_SITE_OTHER): Payer: Medicare Other | Admitting: Vascular Surgery

## 2012-12-18 VITALS — BP 128/71 | HR 85 | Ht 69.0 in | Wt 186.9 lb

## 2012-12-18 DIAGNOSIS — I739 Peripheral vascular disease, unspecified: Secondary | ICD-10-CM

## 2012-12-18 DIAGNOSIS — Z48812 Encounter for surgical aftercare following surgery on the circulatory system: Secondary | ICD-10-CM

## 2012-12-18 NOTE — Progress Notes (Signed)
[facility]  HPI: This is a 67 y.o. Male who is here for a for a follow up visit to check ulcerations on his feet and his groin incisions. He had some trouble healing his left groin incision He underwent axillary bifemoral bypass 01/29/2012.  His wife report a new blister on the tip of his left great toe due to new shoe ware that is healing fine and a superficial scrap on the lateral malleolus.      Objective 128/71 85     96%   Palpable graft axillary to femoral No palpable pulses distally   Lower arterial duplex performed today 12/18/2012 Shows inflow stenosis and left common femoral artery stenosis  ABI 12/18/2012 Right 0.44 Left 0.33  ABI 08/28/2012 Right 0.52 Left 0.39  Assessment/Planning: S/P  axillary bifemoral bypass 01/29/2012 The studies today suggest increased stenosis at the inflow anastomosis and in the left common femoral by-pass.  We are going to order a CT chest abdomin and pelvis with r/o.  He will f/u 2 weeks.   Clinton Gallant [facility] 12/18/2012 4:10 PM   History exam details as above. The patient has a history of metastatic lung cancer which is currently controlled. He is also had a previous stroke and is able to ambulate somewhat but overall is fairly debilitated. The patient has a new ulceration on his left second toe. The left first toe is dusky. The wife states the ulcer is from a recent blister from a malfitting shoe. Duplex exam today suggests possible subclavian inflow stenosis. He also has evidence on duplex of left common femoral stenosis. I believe the best option to fully examine this would be CT Angio the chest abdomen pelvis with lower extremity runoff as arteriogram in the presence of the axillary bifemoral bypass would technically be more difficult. He will followup in 2 weeks.  Fabienne Bruns, MD [facility] [place] Office: [phone] Pager: [phone]    VASCULAR QUALITY INITIATIVE  FOLLOW UP DATA: 12/18/2012 Current smoker: [x  ] yes  [  ] no  Living status: [ x ]  Home  [  ] Nursing home  [  ] Homeless    MEDS:  ASA [  ] yes  [ x ] no- [  ] medical reason  [  ] non compliant  STATIN  [  ] yes  [ x ] no- [  ] medical reason  [  ] non compliant  Beta blocker [  ] yes  [ x ] no- [  ] medical reason  [  ] non compliant  ACE inhibitor [x  ] yes  [  ] no- [  ] medical reason  [  ] non compliant  P2Y12 Antagonist [ x ] none  [  ] clopidogrel-Plavix  [  ] ticlopidine-Ticlid   [  ] prasugrel-Effient  [  ] ticagrelor- Brilinta    Anticoagulant [x  ] None  [  ] warfarin  [  ] rivaroxaban-Xarelto [  ] dabigatran- Pradaxa  Ambulation: [  ] Amb  [x  ] Amb with assistance  [  ] wheelchair  [  ] bedridden  Ipsilateral Sx: [x  ] none   [  ] claudication  [  ] rest pain  [  ] tissue loss  Current Patency: [ x ] primary  [  ] primary-assisted  [  ] secondary  [  ] occluded  Patency judged by: [  ] doppler  [  ]  palp graft pulse  [  ] palp distal pulse   [x  ] ABI increase > 15%   [ x ] Duplex  If occluded, when-   Ipsilateral ABI: right 0.44 left 0.33     Infection: [ x] none  [  ] cellulitis  [  ] deep abscess  [  ] infection of artery or graft  Bypass revision: [x ] no  [  ] yes- [  ] surg  [  ] catheter based  [  ] both    Date:   Thrombectomy/ lysis/ revision:  [ x ] no  [  ] yes- [  ] surg  [  ] catheter based  [  ] both    Date:   Major amputation: [x  ] no  [  ] minor amp  [  ] BKA  [  ] AKA   Date:

## 2012-12-19 NOTE — Addendum Note (Signed)
Addended by: Adria Dill L on: 12/19/2012 11:21 AM   Modules accepted: Orders

## 2012-12-31 ENCOUNTER — Ambulatory Visit
Admission: RE | Admit: 2012-12-31 | Discharge: 2012-12-31 | Disposition: A | Discharge status: Home / Self Care | Payer: Medicare Other | Source: Ambulatory Visit | Attending: Vascular Surgery | Admitting: Vascular Surgery

## 2012-12-31 ENCOUNTER — Encounter: Payer: Self-pay | Admitting: Vascular Surgery

## 2012-12-31 DIAGNOSIS — I739 Peripheral vascular disease, unspecified: Secondary | ICD-10-CM

## 2012-12-31 DIAGNOSIS — Z48812 Encounter for surgical aftercare following surgery on the circulatory system: Secondary | ICD-10-CM

## 2012-12-31 MED ORDER — IOHEXOL 350 MG/ML SOLN
150.0000 mL | Freq: Once | INTRAVENOUS | Status: AC | PRN
  Administered 2012-12-31: 150 mL via INTRAVENOUS

## 2013-01-01 ENCOUNTER — Ambulatory Visit (INDEPENDENT_AMBULATORY_CARE_PROVIDER_SITE_OTHER): Payer: Medicare Other | Admitting: Vascular Surgery

## 2013-01-01 ENCOUNTER — Other Ambulatory Visit: Payer: Self-pay | Admitting: *Deleted

## 2013-01-01 ENCOUNTER — Other Ambulatory Visit: Payer: Self-pay

## 2013-01-01 ENCOUNTER — Encounter: Payer: Self-pay | Admitting: Vascular Surgery

## 2013-01-01 ENCOUNTER — Encounter (HOSPITAL_COMMUNITY): Payer: Self-pay | Admitting: Pharmacy Technician

## 2013-01-01 VITALS — BP 143/50 | HR 82 | Ht 69.0 in | Wt 184.2 lb

## 2013-01-01 DIAGNOSIS — I739 Peripheral vascular disease, unspecified: Secondary | ICD-10-CM

## 2013-01-01 NOTE — Progress Notes (Addendum)
HPI: Patient is a 66-year-old male who underwent axillary bifemoral bypass 01/29/2012. His wife report a new blister on the tip of his left great toe due to new shoe ware that is healing fine and a superficial scrape on the lateral malleolus. He returns today for followup after recent CT angiogram with runoff. He thinks that the toes have gotten worse. He still has pain in the left foot. He is fairly debilitated overall from prior stroke. He is able to transfer but is not able to ambulate much due to left-sided weakness.  Past Medical History  Diagnosis Date  . GERD (gastroesophageal reflux disease)   . Hyperlipidemia   . Hypertension   . Peripheral vascular disease   . Type II diabetes mellitus   . Pneumonia   . Internal and external bleeding hemorrhoids   . Stroke 2008    "left side weak; unable to move left hand still" (01/23/2012)  . History of gout     left great toe  . anal ca dx'd 08/2007    S/P chemo, radiation, biopsies  . Metastases to the liver dx'd 08/2010    Past Surgical History  Procedure Laterality Date  . Carotid endarterectomy  2008    right  . Liver cryoablation  08/2010    "chemo shrunk the cancer 50% then they went in and burned the rest of it"  . Mass biopsy      rectal; "found cancer"  . Axillary-femoral bypass graft  01/29/2012    Procedure: BYPASS GRAFT AXILLA-BIFEMORAL;  Surgeon: Charles E Fields, MD;  Location: [facility] OR;  Service: Vascular;  Laterality: N/A;  Left axilla-bifemoral bypass using gore-tex graft.     Current Outpatient Prescriptions on File Prior to Visit  Medication Sig Dispense Refill  . acidophilus (RISAQUAD) CAPS Take by mouth daily.        . Ascorbic Acid (VITAMIN C) 1000 MG tablet Take 1,000 mg by mouth daily.      . buPROPion (ZYBAN) 150 MG 12 hr tablet Take 150 mg by mouth daily.       . dipyridamole-aspirin (AGGRENOX) 200-25 MG per 12 hr capsule Take 1 capsule by mouth 2 (two) times daily.  180 capsule  1  . docusate sodium (COLACE) 100 MG  capsule Take 200 mg by mouth 2 (two) times daily.      . gabapentin (NEURONTIN) 300 MG capsule Take 1 capsule (300 mg total) by mouth 3 (three) times daily.  90 capsule  3  . lisinopril (PRINIVIL,ZESTRIL) 5 MG tablet Take 0.5 tablets (2.5 mg total) by mouth daily.      . metFORMIN (GLUMETZA) 500 MG (MOD) 24 hr tablet Take 500 mg by mouth 2 (two) times daily with a meal.       . omega-3 acid ethyl esters (LOVAZA) 1 G capsule Take 2 g by mouth 2 (two) times daily.        . omeprazole (PRILOSEC) 20 MG capsule Take 40 mg by mouth daily.       . OVER THE COUNTER MEDICATION        No current facility-administered medications on file prior to visit.   Allergies  Allergen Reactions  . Other     Became hypotensive. Pt states allergic to "muscle relaxer" but unsure of name.  . Tape     medfix non woven tape     Objective   Filed Vitals:   01/01/13 1123  BP: 143/50  Pulse: 82  Height: 5' 9" (1.753 m)  Weight:   184 lb 3.2 oz (83.553 kg)  SpO2: 100%   Doppler flow left axillary femoral bypass graft, palpable femoral pulses bilaterally  Extremities: No palpable pedal pulses bilaterally dusky blister tip of first and second toe left foot Chest: Clear to auscultation bilaterally  Cardiac: Regular rate and without murmur  Data: CT Angio images of the chest abdomen pelvis with runoff were reviewed.  The proximal axillary anastomosis was clipped off the page and is not visualized.   The left common iliac artery, left external iliac artery and proximal left internal iliac artery are occluded.  Right Lower Extremity: The right common iliac artery at least moderate narrowing at the origin of the right external iliac artery and diffuse circumferential plaque in the distal external iliac artery. Near occlusion of the distal right external iliac artery. There is a patent femoral to femoral bypass graft. Circumferential plaque in the distal right common femoral artery with flow in the deep femoral arteries.  The right superficial femoral artery is occluded at the origin. There is distal reconstitution of the superficial femoral artery in the lower thigh. Significant narrowing of the superficial femoral artery near the adductor canal. The right popliteal artery is patent and there is three-vessel runoff in the right calf. There is flow from the dorsalis pedis artery and posterior tibial artery below the ankle.Left Lower Extremity: There is a critical stenosis in the left common femoral artery just distal to the femoral bypass graft. Origin of the left superficial femoral artery is occluded. There is flow in the deep femoral arteries. Distal reconstitution of left superficial femoral artery. Diffuse narrowing of the popliteal artery. Popliteal artery is patent with three-vessel runoff in the left calf. The dorsalis pedis artery is occluded at the ankle.   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 He now has a limb threatening situation with ulcerations and blistering of his left first and second toe with decreased ABIs. He has evidence of common femoral artery occlusive disease below the anastomosis. He also has a proximal SFA occlusion. He also has popliteal artery occlusive disease. Apparently there is three-vessel runoff to the right foot. I believe the best option would be a left femoral to below-knee popliteal bypass with vein if this is available conduit. If not we will use prosthetic. We'll assess the adequacy of his inflow on the table. If the inflow was found to be poor then we would possibly also revise the proximal anastomosis as well. Most likely I will only address the outflow procedure in this setting. We would consider an arteriogram to evaluate the inflow later on if it is not adequate.  Charles Fields, MD  [facility]  Office: [phone]  Pager:  [phone]    

## 2013-01-02 ENCOUNTER — Telehealth: Payer: Self-pay | Admitting: *Deleted

## 2013-01-02 ENCOUNTER — Encounter (HOSPITAL_COMMUNITY)
Admission: RE | Admit: 2013-01-02 | Discharge: 2013-01-02 | Disposition: A | Discharge status: Home / Self Care | Payer: Medicare Other | Source: Ambulatory Visit | Attending: Vascular Surgery | Admitting: Vascular Surgery

## 2013-01-02 ENCOUNTER — Other Ambulatory Visit: Payer: Self-pay | Admitting: *Deleted

## 2013-01-02 ENCOUNTER — Encounter (HOSPITAL_COMMUNITY): Payer: Self-pay

## 2013-01-02 DIAGNOSIS — Z01812 Encounter for preprocedural laboratory examination: Secondary | ICD-10-CM | POA: Insufficient documentation

## 2013-01-02 DIAGNOSIS — Z22321 Carrier or suspected carrier of Methicillin susceptible Staphylococcus aureus: Secondary | ICD-10-CM

## 2013-01-02 DIAGNOSIS — Z01818 Encounter for other preprocedural examination: Secondary | ICD-10-CM | POA: Insufficient documentation

## 2013-01-02 HISTORY — DX: Anesthesia of skin: R20.0

## 2013-01-02 HISTORY — DX: Depression, unspecified: F32.A

## 2013-01-02 HISTORY — DX: Major depressive disorder, single episode, unspecified: F32.9

## 2013-01-02 HISTORY — DX: Atherosclerotic heart disease of native coronary artery without angina pectoris: I25.10

## 2013-01-02 LAB — COMPREHENSIVE METABOLIC PANEL
AST: 31 U/L (ref 0–37)
BUN: 15 mg/dL (ref 6–23)
CO2: 23 mEq/L (ref 19–32)
Chloride: 100 mEq/L (ref 96–112)
GFR calc non Af Amer: 60 mL/min — ABNORMAL LOW (ref >90)
Glucose, Bld: 209 mg/dL — ABNORMAL HIGH (ref 70–99)
Potassium: 4.8 mEq/L (ref 3.5–5.1)

## 2013-01-02 LAB — CBC
HCT: 30.7 % — ABNORMAL LOW (ref 39.0–52.0)
Hemoglobin: 9.2 g/dL — ABNORMAL LOW (ref 13.0–17.0)
RBC: 3.82 MIL/uL — ABNORMAL LOW (ref 4.22–5.81)
WBC: 7.5 10*3/uL (ref 4.0–10.5)

## 2013-01-02 LAB — URINALYSIS, ROUTINE W REFLEX MICROSCOPIC
Glucose, UA: NEGATIVE mg/dL
Hgb urine dipstick: NEGATIVE
Specific Gravity, Urine: 1.016 (ref 1.005–1.030)

## 2013-01-02 LAB — PROTIME-INR: INR: 1.06 (ref 0.00–1.49)

## 2013-01-02 LAB — APTT: aPTT: 30 seconds (ref 24–37)

## 2013-01-02 MED ORDER — MUPIROCIN 2 % EX OINT: TOPICAL_OINTMENT | CUTANEOUS | Status: DC

## 2013-01-02 NOTE — Telephone Encounter (Signed)
Called patient to confirm appt for 01-05-13 no answer. Left a message to call office and confirm.

## 2013-01-02 NOTE — Pre-Procedure Instructions (Addendum)
Donald [name]  01/02/2013   Your procedure is scheduled on:  Tuesday, January 06, 2013  Report to [facility] at 7:30 AM.  Call this number if you have problems the morning of surgery: [phone]   Remember:   Do not eat food or drink liquids after midnight.   Take these medicines the morning of surgery with A SIP OF WATER: buPROPion (ZYBAN) 150 MG 12 hr tablet, gabapentin (NEURONTIN) 300 MG capsule, omeprazole (PRILOSEC) 40 MG capsule Stop taking herbal medications (acidophilus (RISAQUAD) CAPS), omega-3 acid ethyl esters (LOVAZA) 1 G capsule, Probiotic Product (ALIGN PO).  Do not take any NSAIDs ie: Ibuprofen, Advil, Naproxen or any medication containing Aspirin. Hold dipyridamole-aspirin (AGGRENOX) 200-25 MG per 12 hr capsule; Last dose tonight Friday 01/02/13   Do not wear jewelry, make-up or nail polish.  Do not wear lotions, powders, or perfumes. You may NOT wear deodorant.  Do not shave 48 hours prior to surgery. Men may shave face and neck.  Do not bring valuables to the hospital.  [facility] is not responsible for any belongings or valuables.  Contacts, dentures or bridgework may not be worn into surgery.  Leave suitcase in the car. After surgery it may be brought to your room.  For patients admitted to the hospital, checkout time is 11:00 AM the day of discharge.   Patients discharged the day of surgery will not be allowed to drive home.  Name and phone number of your driver:   Special Instructions: Shower using CHG 2 nights before surgery and the night before surgery.  If you shower the day of surgery use CHG.  Use special wash - you have one bottle of CHG for all showers.  You should use approximately 1/3 of the bottle for each shower.   Please read over the following fact sheets that you were given: Pain Booklet, Coughing and Deep Breathing, Blood Transfusion Information, MRSA Information and Surgical Site Infection Prevention

## 2013-01-02 NOTE — Progress Notes (Signed)
Darel Hong, RN ( surgical scheduler) for Dr. Darrick Penna made aware that pt Hemoglobin is 9.2. Chart left for [place], [place] (anesthesia) to review abnormal labs.

## 2013-01-02 NOTE — Telephone Encounter (Signed)
Esther from [facility] called to ask [place] to call in Murpirocin for Mr Debrosse to [place] on [address] due to positive Staph nasal swap preop. It was faxed in.

## 2013-01-02 NOTE — Progress Notes (Signed)
Pt denies SOB, chest pain, and being under the care of a cardiologist. Dr. Mirna Mires, pt PCP of [facility] notified of positive STOP-BANG assessment Tool results. Okey Regal, RN (Dr.field surgical scheduler) notified regards instructions for  dipyridamole-aspirin (AGGRENOX) 200-25 MG per 12 hr capsule. According to carol, RN, pt should take last dose today 01/02/13 and hold "three days prior to surgery."

## 2013-01-05 ENCOUNTER — Encounter (HOSPITAL_COMMUNITY): Payer: Self-pay

## 2013-01-05 ENCOUNTER — Encounter: Payer: Self-pay | Admitting: Nurse Practitioner

## 2013-01-05 ENCOUNTER — Ambulatory Visit (INDEPENDENT_AMBULATORY_CARE_PROVIDER_SITE_OTHER): Payer: Medicare Other | Admitting: Nurse Practitioner

## 2013-01-05 VITALS — BP 142/59 | HR 92 | Ht 69.0 in | Wt 183.0 lb

## 2013-01-05 DIAGNOSIS — Z8679 Personal history of other diseases of the circulatory system: Secondary | ICD-10-CM

## 2013-01-05 DIAGNOSIS — I69959 Hemiplegia and hemiparesis following unspecified cerebrovascular disease affecting unspecified side: Secondary | ICD-10-CM

## 2013-01-05 DIAGNOSIS — R269 Unspecified abnormalities of gait and mobility: Secondary | ICD-10-CM

## 2013-01-05 DIAGNOSIS — I69354 Hemiplegia and hemiparesis following cerebral infarction affecting left non-dominant side: Secondary | ICD-10-CM

## 2013-01-05 DIAGNOSIS — M216X9 Other acquired deformities of unspecified foot: Secondary | ICD-10-CM

## 2013-01-05 LAB — PREPARE RBC (CROSSMATCH)

## 2013-01-05 MED ORDER — DEXTROSE 5 % IV SOLN
1.5000 g | INTRAVENOUS | Status: AC
  Administered 2013-01-06: 1.5 g via INTRAVENOUS
  Filled 2013-01-05 (×2): qty 1.5

## 2013-01-05 NOTE — Progress Notes (Signed)
Reason for visit followup for remote history of stroke and residual left hemiparesis.  HPI: [name], 67 year old white male returns for followup after his last visit was 5/7/ 2014.  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 He also has a history of left foot pain with baseline peripheral neuropathy due to his diabetes. He has had increasing weakness in the right leg following a femoral popliteal bypass several months ago, he is currently on Neurontin which helps with his lower extremity paresthesias. He is due for vascular surgery tomorrow on the left leg. His blood pressure is fairly well controlled. His fasting sugars are up and down. His  physical and occupational therapy has concluded and he is not doing home exercise .He has not had further stroke or TIA symptoms. He is using a single-point cane, he denies any recent falls.   ROS:  14 system review of symptoms is negative except for the following Weight gain, swelling in legs, numbness and weakness   Medications Current Outpatient Prescriptions on File Prior to Visit  Medication Sig Dispense Refill  . acidophilus (RISAQUAD) CAPS Take 1 capsule by mouth daily.       . Ascorbic Acid (VITAMIN C) 1000 MG tablet Take 1,000 mg by mouth daily.      Marland Kitchen buPROPion (ZYBAN) 150 MG 12 hr tablet Take 150 mg by mouth daily.       Marland Kitchen dipyridamole-aspirin (AGGRENOX) 200-25 MG per 12 hr capsule Take 1 capsule by mouth 2 (two) times daily.  180 capsule  1  . docusate sodium (COLACE) 100 MG capsule Take 200 mg by mouth 2 (two) times daily.      Marland Kitchen gabapentin (NEURONTIN) 300 MG capsule Take 300 mg by mouth 3 (three) times daily.      Marland Kitchen lisinopril (PRINIVIL,ZESTRIL) 5 MG tablet Take 0.5 tablets (2.5 mg total) by mouth daily.      . metFORMIN (GLUMETZA) 500 MG (MOD) 24 hr tablet Take 500 mg by mouth 2 (two) times daily with a meal.       . mupirocin ointment (BACTROBAN) 2 % 1 Application, nasally, 2  times daily for 5 days prior to surgery  22 g  0  . omega-3 acid ethyl esters (LOVAZA) 1 G capsule Take 2 g by mouth 2 (two) times daily.       Marland Kitchen omeprazole (PRILOSEC) 40 MG capsule Take 40 mg by mouth daily.      . Probiotic Product (ALIGN PO) Take 1 tablet by mouth at bedtime.       Current Facility-Administered Medications on File Prior to Visit  Medication Dose Route Frequency Provider Last Rate Last Dose  . cefUROXime (ZINACEF) 1.5 g in dextrose 5 % 50 mL IVPB  1.5 g Intravenous 30 min Pre-Op Sherren Kerns, MD        Allergies  Allergies  Allergen Reactions  . Other     Became hypotensive. Pt states allergic to "muscle relaxer" but unsure of name.  . Tape Hives and Itching    medfix non woven tape    Physical Exam General: well developed, well nourished, seated, in no evident distress Head: head normocephalic and atraumatic. Oropharynx benign Neck: supple with no carotid  bruits Cardiovascular: regular rate and rhythm, no murmurs Skin minimal bruising, 1 + edema of both lower extremities  Neurologic Exam Mental Status: Awake and fully alert. Oriented to place and time. Follows all commands. Speech and language normal.  Cranial Nerves: Pupils equal, briskly reactive to light. Extraocular movements full without nystagmus. Visual fields full to confrontation. Hearing intact and symmetric to finger snap. Mild left lower facial weakness  Face, tongue, palate move normally and symmetrically. Neck flexion and extension normal.  Motor: Left hemiparesis with 2/5 in the shoulder, 0/5 left hand, 4/5 left leg with diminished  finger movements on the left,  good strength on the right except for a right foot drop, patient is not wearing AFO  Sensory.: Diminished from ankles down bilaterally to  touch and pinprick and vibratory.  Coordination: Rapid alternating movements normal on the right, unable to perform on the left  Gait and Station: Arises from chair without difficulty. Stiffness of  the left leg, foot drop evident, ambulates with a cane tandem gait not attempted   Reflexes: 2+ and symmetric. Toes downgoing.     ASSESSMENT: History of stroke in 2008 and a residual left hemiparesis and worsening gait after right  popliteal bypass graft with new foot drop. Repeat MRI of the brain did not show anything acute. Paresthesias likely due to diabetic neuropathy.  Reviewed CBC and CMP drawn 01/02/13 prior to surgery     PLAN: Continue gabapentin 300 mg 3  times daily Continue Aggrenox for secondary stroke prevention (currently stopped for surgical procedure tomorrow) Control of blood pressure with systolic blood 130 Control of hemoglobin A1c with reading at 6.5 or below LDL cholesterol below 100 Will schedule followup carotid Doppler before next appointment in 4 months Nilda Riggs, GNP-BC APRN

## 2013-01-05 NOTE — Patient Instructions (Addendum)
Resume Aggrenox after surgery for secondary stroke prevention Continue gabapentin 3 times daily Will obtain Doppler at next visit Follow up in 4 months

## 2013-01-05 NOTE — Progress Notes (Signed)
Anesthesia chart review: Patient is a 67 year old male scheduled for left FPBG, possible revision axillofemoral bypass graft on 01/06/13 by Dr. Darrick Penna.  History includes smoking, diabetes mellitus type 2, hypertension, CAD with RCA occlusion with collaterals, PAD s/p left axillary bifemoral BPG 01/2012 and right CEA '08 (with possible occlusion by 01/2012 duplex), GERD, CVA with left hemiparesis 2008, hyperlipidemia, anal cancer with metastasis to the liver s/p cryoablation 01/2011, depression, gout.  PCP is Dr. Mirna Mires. Cardiologist is Dr. Dietrich Pates.  He had a positive stress test for inferolateral ischemia, EF 67% on 01/24/12 which ws followed by a cardiac cath on 01/25/12 that showed:  1. Total occlusion RCA with good left to right collaterals. No obstructive disease LAD or Circumflex  2. Severe PAD  Echo on 01/24/12 showed: - Left ventricle: The cavity size was normal. Wall thickness was increased in a pattern of severe LVH. Systolic function was normal. The estimated ejection fraction was in the range of 55% to 60%. Wall motion was normal; there were no regional wall motion abnormalities. - Atrial septum: No defect or patent foramen ovale was identified.  EKG on 01/23/12 showed NSR, LAD, right BBB.  Carotid duplex on 02/21/12 showed: 1. No flow was adequately detected within the right internal carotid artery which is suggestive of a possible total occlusion. 2. Evidence suggestive of 40-59% stenosis of the left proximal internal carotid artery. 3. Significant stenosis of the right proximal external carotid artery.  CXR on 05/19/12 showed no significant abnormality.   Preoperative labs noted.  H/H 9.2/30.7--already called to [facility] by the PAT RN. Cr 1.20, glucose 209. PT/PTT WNL.  He will get a fasting CBG on arrival.  I'll also added T&C for 2 Units to have available if needed.  Velna Ochs [facility]/Anesthesiology Phone [phone] 01/05/2013 10:19 AM

## 2013-01-05 NOTE — Progress Notes (Signed)
I left a voice mail message with new arrival time of 0830.

## 2013-01-06 ENCOUNTER — Encounter (HOSPITAL_COMMUNITY): Payer: Self-pay | Admitting: Anesthesiology

## 2013-01-06 ENCOUNTER — Encounter (HOSPITAL_COMMUNITY): Payer: Self-pay | Admitting: Vascular Surgery

## 2013-01-06 ENCOUNTER — Inpatient Hospital Stay (HOSPITAL_COMMUNITY): Payer: Medicare Other

## 2013-01-06 ENCOUNTER — Inpatient Hospital Stay (HOSPITAL_COMMUNITY): Payer: Medicare Other | Admitting: Certified Registered Nurse Anesthetist

## 2013-01-06 ENCOUNTER — Encounter (HOSPITAL_COMMUNITY)
Admission: RE | Disposition: A | Discharge status: Rehabilitation Facility | Payer: Self-pay | Source: Ambulatory Visit | Attending: Vascular Surgery

## 2013-01-06 ENCOUNTER — Inpatient Hospital Stay (HOSPITAL_COMMUNITY)
Admission: RE | Admit: 2013-01-06 | Discharge: 2013-01-13 | DRG: 253 | Disposition: A | Discharge status: Rehabilitation Facility | Payer: Medicare Other | Source: Ambulatory Visit | Attending: Vascular Surgery | Admitting: Vascular Surgery

## 2013-01-06 DIAGNOSIS — E785 Hyperlipidemia, unspecified: Secondary | ICD-10-CM | POA: Diagnosis present

## 2013-01-06 DIAGNOSIS — F17200 Nicotine dependence, unspecified, uncomplicated: Secondary | ICD-10-CM | POA: Diagnosis present

## 2013-01-06 DIAGNOSIS — I96 Gangrene, not elsewhere classified: Secondary | ICD-10-CM | POA: Diagnosis present

## 2013-01-06 DIAGNOSIS — I70209 Unspecified atherosclerosis of native arteries of extremities, unspecified extremity: Principal | ICD-10-CM | POA: Diagnosis present

## 2013-01-06 DIAGNOSIS — I69354 Hemiplegia and hemiparesis following cerebral infarction affecting left non-dominant side: Secondary | ICD-10-CM

## 2013-01-06 DIAGNOSIS — I739 Peripheral vascular disease, unspecified: Secondary | ICD-10-CM

## 2013-01-06 DIAGNOSIS — T82898A Other specified complication of vascular prosthetic devices, implants and grafts, initial encounter: Secondary | ICD-10-CM | POA: Diagnosis not present

## 2013-01-06 DIAGNOSIS — R5381 Other malaise: Secondary | ICD-10-CM | POA: Diagnosis present

## 2013-01-06 DIAGNOSIS — Y849 Medical procedure, unspecified as the cause of abnormal reaction of the patient, or of later complication, without mention of misadventure at the time of the procedure: Secondary | ICD-10-CM | POA: Clinically undetermined

## 2013-01-06 DIAGNOSIS — E1149 Type 2 diabetes mellitus with other diabetic neurological complication: Secondary | ICD-10-CM | POA: Diagnosis present

## 2013-01-06 DIAGNOSIS — I743 Embolism and thrombosis of arteries of the lower extremities: Secondary | ICD-10-CM | POA: Diagnosis present

## 2013-01-06 DIAGNOSIS — E1142 Type 2 diabetes mellitus with diabetic polyneuropathy: Secondary | ICD-10-CM | POA: Diagnosis present

## 2013-01-06 DIAGNOSIS — I10 Essential (primary) hypertension: Secondary | ICD-10-CM | POA: Diagnosis present

## 2013-01-06 DIAGNOSIS — L97509 Non-pressure chronic ulcer of other part of unspecified foot with unspecified severity: Secondary | ICD-10-CM | POA: Diagnosis present

## 2013-01-06 DIAGNOSIS — I69959 Hemiplegia and hemiparesis following unspecified cerebrovascular disease affecting unspecified side: Secondary | ICD-10-CM

## 2013-01-06 DIAGNOSIS — D62 Acute posthemorrhagic anemia: Secondary | ICD-10-CM | POA: Diagnosis not present

## 2013-01-06 DIAGNOSIS — L98499 Non-pressure chronic ulcer of skin of other sites with unspecified severity: Secondary | ICD-10-CM

## 2013-01-06 DIAGNOSIS — R269 Unspecified abnormalities of gait and mobility: Secondary | ICD-10-CM | POA: Diagnosis present

## 2013-01-06 DIAGNOSIS — K219 Gastro-esophageal reflux disease without esophagitis: Secondary | ICD-10-CM | POA: Diagnosis present

## 2013-01-06 DIAGNOSIS — I2510 Atherosclerotic heart disease of native coronary artery without angina pectoris: Secondary | ICD-10-CM | POA: Diagnosis present

## 2013-01-06 DIAGNOSIS — I959 Hypotension, unspecified: Secondary | ICD-10-CM | POA: Diagnosis present

## 2013-01-06 DIAGNOSIS — I70229 Atherosclerosis of native arteries of extremities with rest pain, unspecified extremity: Secondary | ICD-10-CM

## 2013-01-06 DIAGNOSIS — IMO0001 Reserved for inherently not codable concepts without codable children: Secondary | ICD-10-CM

## 2013-01-06 DIAGNOSIS — I771 Stricture of artery: Secondary | ICD-10-CM | POA: Diagnosis present

## 2013-01-06 HISTORY — PX: INTRAOPERATIVE ARTERIOGRAM: SHX5157

## 2013-01-06 HISTORY — PX: FEMORAL-POPLITEAL BYPASS GRAFT: SHX937

## 2013-01-06 LAB — GLUCOSE, CAPILLARY: Glucose-Capillary: 152 mg/dL — ABNORMAL HIGH (ref 70–99)

## 2013-01-06 LAB — POCT I-STAT 4, (NA,K, GLUC, HGB,HCT)
Potassium: 4.7 mEq/L (ref 3.5–5.1)
Sodium: 139 mEq/L (ref 135–145)

## 2013-01-06 LAB — HEMOGLOBIN AND HEMATOCRIT, BLOOD
HCT: 29.7 % — ABNORMAL LOW (ref 39.0–52.0)
Hemoglobin: 9.7 g/dL — ABNORMAL LOW (ref 13.0–17.0)

## 2013-01-06 SURGERY — BYPASS GRAFT FEMORAL-POPLITEAL ARTERY
Anesthesia: General | Site: Leg Upper | Laterality: Left | Wound class: Clean

## 2013-01-06 MED ORDER — BUPROPION HCL ER (SMOKING DET) 150 MG PO TB12
150.0000 mg | ORAL_TABLET | Freq: Every day | ORAL | Status: DC
  Administered 2013-01-07 – 2013-01-09 (×3): 150 mg via ORAL
  Filled 2013-01-06 (×6): qty 1

## 2013-01-06 MED ORDER — SODIUM CHLORIDE 0.9 % IR SOLN
Status: DC | PRN
  Administered 2013-01-06: 14:00:00

## 2013-01-06 MED ORDER — GLYCOPYRROLATE 0.2 MG/ML IJ SOLN
INTRAMUSCULAR | Status: DC | PRN
  Administered 2013-01-06: 0.4 mg via INTRAVENOUS

## 2013-01-06 MED ORDER — ONDANSETRON HCL 4 MG/2ML IJ SOLN
4.0000 mg | Freq: Four times a day (QID) | INTRAMUSCULAR | Status: DC | PRN
  Filled 2013-01-06: qty 2

## 2013-01-06 MED ORDER — THROMBIN 20000 UNITS EX SOLR
CUTANEOUS | Status: AC
  Filled 2013-01-06: qty 20000

## 2013-01-06 MED ORDER — POTASSIUM CHLORIDE CRYS ER 20 MEQ PO TBCR: 20.0000 meq | EXTENDED_RELEASE_TABLET | Freq: Every day | ORAL | Status: DC | PRN

## 2013-01-06 MED ORDER — INSULIN ASPART 100 UNIT/ML ~~LOC~~ SOLN
0.0000 [IU] | Freq: Three times a day (TID) | SUBCUTANEOUS | Status: DC
  Administered 2013-01-07: 5 [IU] via SUBCUTANEOUS
  Administered 2013-01-07 – 2013-01-09 (×6): 3 [IU] via SUBCUTANEOUS

## 2013-01-06 MED ORDER — BISACODYL 10 MG RE SUPP: 10.0000 mg | Freq: Every day | RECTAL | Status: DC | PRN

## 2013-01-06 MED ORDER — PANTOPRAZOLE SODIUM 40 MG PO TBEC
40.0000 mg | DELAYED_RELEASE_TABLET | Freq: Every day | ORAL | Status: DC
  Administered 2013-01-07 – 2013-01-09 (×3): 40 mg via ORAL
  Filled 2013-01-06 (×4): qty 1

## 2013-01-06 MED ORDER — LIDOCAINE HCL (CARDIAC) 20 MG/ML IV SOLN
INTRAVENOUS | Status: DC | PRN
  Administered 2013-01-06: 60 mg via INTRAVENOUS

## 2013-01-06 MED ORDER — HYDROMORPHONE HCL PF 1 MG/ML IJ SOLN
INTRAMUSCULAR | Status: AC
  Administered 2013-01-06: 0.5 mg
  Filled 2013-01-06: qty 1

## 2013-01-06 MED ORDER — ACETAMINOPHEN 325 MG PO TABS
325.0000 mg | ORAL_TABLET | ORAL | Status: DC | PRN
  Filled 2013-01-06: qty 2

## 2013-01-06 MED ORDER — ONDANSETRON HCL 4 MG/2ML IJ SOLN: 4.0000 mg | Freq: Once | INTRAMUSCULAR | Status: DC | PRN

## 2013-01-06 MED ORDER — NEOSTIGMINE METHYLSULFATE 1 MG/ML IJ SOLN
INTRAMUSCULAR | Status: DC | PRN
  Administered 2013-01-06: 3 mg via INTRAVENOUS

## 2013-01-06 MED ORDER — RISAQUAD PO CAPS
1.0000 | ORAL_CAPSULE | Freq: Every day | ORAL | Status: DC
  Administered 2013-01-07 – 2013-01-09 (×3): 1 via ORAL
  Filled 2013-01-06 (×4): qty 1

## 2013-01-06 MED ORDER — ACETAMINOPHEN 650 MG RE SUPP: 325.0000 mg | RECTAL | Status: DC | PRN

## 2013-01-06 MED ORDER — HYDRALAZINE HCL 20 MG/ML IJ SOLN: 10.0000 mg | INTRAMUSCULAR | Status: DC | PRN

## 2013-01-06 MED ORDER — SODIUM CHLORIDE 0.9 % IV SOLN: INTRAVENOUS | Status: DC

## 2013-01-06 MED ORDER — ROCURONIUM BROMIDE 100 MG/10ML IV SOLN
INTRAVENOUS | Status: DC | PRN
  Administered 2013-01-06: 20 mg via INTRAVENOUS
  Administered 2013-01-06: 50 mg via INTRAVENOUS
  Administered 2013-01-06: 20 mg via INTRAVENOUS
  Administered 2013-01-06: 10 mg via INTRAVENOUS

## 2013-01-06 MED ORDER — ONDANSETRON HCL 4 MG/2ML IJ SOLN
INTRAMUSCULAR | Status: DC | PRN
  Administered 2013-01-06: 4 mg via INTRAVENOUS

## 2013-01-06 MED ORDER — GABAPENTIN 300 MG PO CAPS
300.0000 mg | ORAL_CAPSULE | Freq: Three times a day (TID) | ORAL | Status: DC
  Administered 2013-01-07 – 2013-01-09 (×8): 300 mg via ORAL
  Filled 2013-01-06 (×12): qty 1

## 2013-01-06 MED ORDER — LACTATED RINGERS IV SOLN
INTRAVENOUS | Status: DC | PRN
  Administered 2013-01-06 (×2): via INTRAVENOUS

## 2013-01-06 MED ORDER — METOPROLOL TARTRATE 1 MG/ML IV SOLN: 2.0000 mg | INTRAVENOUS | Status: DC | PRN

## 2013-01-06 MED ORDER — IOHEXOL 300 MG/ML  SOLN
INTRAMUSCULAR | Status: DC | PRN
  Administered 2013-01-06: 50 mL via INTRAVENOUS

## 2013-01-06 MED ORDER — OMEGA-3-ACID ETHYL ESTERS 1 G PO CAPS
2.0000 g | ORAL_CAPSULE | Freq: Two times a day (BID) | ORAL | Status: DC
  Administered 2013-01-07 – 2013-01-09 (×6): 2 g via ORAL
  Filled 2013-01-06 (×8): qty 2

## 2013-01-06 MED ORDER — SENNOSIDES-DOCUSATE SODIUM 8.6-50 MG PO TABS
1.0000 | ORAL_TABLET | Freq: Every evening | ORAL | Status: DC | PRN
  Administered 2013-01-07: 1 via ORAL
  Filled 2013-01-06 (×2): qty 1

## 2013-01-06 MED ORDER — ALBUMIN HUMAN 5 % IV SOLN
INTRAVENOUS | Status: DC | PRN
  Administered 2013-01-06: 16:00:00 via INTRAVENOUS

## 2013-01-06 MED ORDER — DEXTROSE 5 % IV SOLN
1.5000 g | Freq: Two times a day (BID) | INTRAVENOUS | Status: AC
  Administered 2013-01-06 – 2013-01-07 (×2): 1.5 g via INTRAVENOUS
  Filled 2013-01-06 (×2): qty 1.5

## 2013-01-06 MED ORDER — LISINOPRIL 2.5 MG PO TABS
2.5000 mg | ORAL_TABLET | Freq: Every day | ORAL | Status: DC
  Filled 2013-01-06: qty 1

## 2013-01-06 MED ORDER — LACTATED RINGERS IV SOLN
INTRAVENOUS | Status: DC
  Administered 2013-01-06: 10:00:00 via INTRAVENOUS

## 2013-01-06 MED ORDER — PHENYLEPHRINE HCL 10 MG/ML IJ SOLN
10.0000 mg | INTRAVENOUS | Status: DC | PRN
  Administered 2013-01-06: 25 ug/min via INTRAVENOUS

## 2013-01-06 MED ORDER — SODIUM CHLORIDE 0.9 % IV SOLN
INTRAVENOUS | Status: DC
  Administered 2013-01-06 – 2013-01-07 (×2): via INTRAVENOUS

## 2013-01-06 MED ORDER — HYDROMORPHONE HCL PF 1 MG/ML IJ SOLN: 0.2500 mg | INTRAMUSCULAR | Status: DC | PRN

## 2013-01-06 MED ORDER — GUAIFENESIN-DM 100-10 MG/5ML PO SYRP: 15.0000 mL | ORAL_SOLUTION | ORAL | Status: DC | PRN

## 2013-01-06 MED ORDER — HEPARIN SODIUM (PORCINE) 1000 UNIT/ML IJ SOLN
INTRAMUSCULAR | Status: DC | PRN
  Administered 2013-01-06: 5000 [IU] via INTRAVENOUS
  Administered 2013-01-06: 10000 [IU] via INTRAVENOUS

## 2013-01-06 MED ORDER — PHENOL 1.4 % MT LIQD
1.0000 | OROMUCOSAL | Status: DC | PRN
  Administered 2013-01-06 – 2013-01-07 (×2): 1 via OROMUCOSAL
  Filled 2013-01-06: qty 177

## 2013-01-06 MED ORDER — ASPIRIN-DIPYRIDAMOLE ER 25-200 MG PO CP12
1.0000 | ORAL_CAPSULE | Freq: Two times a day (BID) | ORAL | Status: DC
  Administered 2013-01-07 – 2013-01-08 (×4): 1 via ORAL
  Filled 2013-01-06 (×9): qty 1

## 2013-01-06 MED ORDER — DOCUSATE SODIUM 100 MG PO CAPS
100.0000 mg | ORAL_CAPSULE | Freq: Every day | ORAL | Status: DC
  Administered 2013-01-07 – 2013-01-09 (×3): 100 mg via ORAL
  Filled 2013-01-06 (×4): qty 1

## 2013-01-06 MED ORDER — LABETALOL HCL 5 MG/ML IV SOLN
INTRAVENOUS | Status: DC | PRN
  Administered 2013-01-06: 10 mg via INTRAVENOUS

## 2013-01-06 MED ORDER — LABETALOL HCL 5 MG/ML IV SOLN
10.0000 mg | INTRAVENOUS | Status: DC | PRN
  Filled 2013-01-06: qty 4

## 2013-01-06 MED ORDER — 0.9 % SODIUM CHLORIDE (POUR BTL) OPTIME
TOPICAL | Status: DC | PRN
  Administered 2013-01-06 (×2): 1000 mL

## 2013-01-06 MED ORDER — ALUM & MAG HYDROXIDE-SIMETH 200-200-20 MG/5ML PO SUSP: 15.0000 mL | ORAL | Status: DC | PRN

## 2013-01-06 MED ORDER — PROPOFOL 10 MG/ML IV BOLUS
INTRAVENOUS | Status: DC | PRN
  Administered 2013-01-06: 150 mg via INTRAVENOUS

## 2013-01-06 MED ORDER — MORPHINE SULFATE 2 MG/ML IJ SOLN
2.0000 mg | INTRAMUSCULAR | Status: DC | PRN
  Administered 2013-01-07 (×5): 2 mg via INTRAVENOUS
  Administered 2013-01-11: 4 mg via INTRAVENOUS
  Filled 2013-01-06: qty 2
  Filled 2013-01-06 (×5): qty 1

## 2013-01-06 MED ORDER — METFORMIN HCL ER 500 MG PO TB24
500.0000 mg | ORAL_TABLET | Freq: Two times a day (BID) | ORAL | Status: DC
  Filled 2013-01-06 (×3): qty 1

## 2013-01-06 MED ORDER — FENTANYL CITRATE 0.05 MG/ML IJ SOLN
INTRAMUSCULAR | Status: DC | PRN
  Administered 2013-01-06 (×5): 50 ug via INTRAVENOUS

## 2013-01-06 MED ORDER — OXYCODONE-ACETAMINOPHEN 5-325 MG PO TABS
1.0000 | ORAL_TABLET | ORAL | Status: DC | PRN
  Administered 2013-01-07 (×4): 1 via ORAL
  Administered 2013-01-08 – 2013-01-09 (×4): 2 via ORAL
  Filled 2013-01-06: qty 1
  Filled 2013-01-06: qty 2
  Filled 2013-01-06: qty 1
  Filled 2013-01-06: qty 2
  Filled 2013-01-06: qty 1
  Filled 2013-01-06: qty 2
  Filled 2013-01-06: qty 1
  Filled 2013-01-06: qty 2
  Filled 2013-01-06: qty 1

## 2013-01-06 MED ORDER — EPHEDRINE SULFATE 50 MG/ML IJ SOLN
INTRAMUSCULAR | Status: DC | PRN
  Administered 2013-01-06: 20 mg via INTRAVENOUS

## 2013-01-06 MED ORDER — SODIUM CHLORIDE 0.9 % IV SOLN
500.0000 mL | Freq: Once | INTRAVENOUS | Status: AC | PRN
  Administered 2013-01-06: 500 mL via INTRAVENOUS

## 2013-01-06 SURGICAL SUPPLY — 78 items
ADH SKN CLS APL DERMABOND .7 (GAUZE/BANDAGES/DRESSINGS) ×1
BANDAGE ESMARK 6X9 LF (GAUZE/BANDAGES/DRESSINGS) IMPLANT
BLADE SURG 10 STRL SS (BLADE) ×1 IMPLANT
BNDG CMPR 9X6 STRL LF SNTH (GAUZE/BANDAGES/DRESSINGS)
BNDG ESMARK 6X9 LF (GAUZE/BANDAGES/DRESSINGS)
CANISTER SUCTION 2500CC (MISCELLANEOUS) ×2 IMPLANT
CANNULA VESSEL 3MM 2 BLNT TIP (CANNULA) ×2 IMPLANT
CATH EMB 4FR 80CM (CATHETERS) ×1 IMPLANT
CLIP TI MEDIUM 24 (CLIP) ×3 IMPLANT
CLIP TI WIDE RED SMALL 24 (CLIP) ×2 IMPLANT
CLOTH BEACON ORANGE TIMEOUT ST (SAFETY) ×2 IMPLANT
COVER SURGICAL LIGHT HANDLE (MISCELLANEOUS) ×2 IMPLANT
CUFF TOURNIQUET SINGLE 24IN (TOURNIQUET CUFF) IMPLANT
CUFF TOURNIQUET SINGLE 34IN LL (TOURNIQUET CUFF) IMPLANT
CUFF TOURNIQUET SINGLE 44IN (TOURNIQUET CUFF) IMPLANT
DERMABOND ADVANCED (GAUZE/BANDAGES/DRESSINGS) ×1
DERMABOND ADVANCED .7 DNX12 (GAUZE/BANDAGES/DRESSINGS) IMPLANT
DRAIN SNY WOU (WOUND CARE) IMPLANT
DRAPE PROXIMA HALF (DRAPES) ×1 IMPLANT
DRAPE WARM FLUID 44X44 (DRAPE) ×2 IMPLANT
DRAPE X RAY CASS MED 25220 (DRAPES) ×1 IMPLANT
DRAPE X-RAY CASS 24X20 (DRAPES) IMPLANT
DRSG COVADERM 4X14 (GAUZE/BANDAGES/DRESSINGS) ×1 IMPLANT
DRSG COVADERM 4X6 (GAUZE/BANDAGES/DRESSINGS) ×1 IMPLANT
DRSG COVADERM 4X8 (GAUZE/BANDAGES/DRESSINGS) ×1 IMPLANT
ELECT REM PT RETURN 9FT ADLT (ELECTROSURGICAL) ×2
ELECTRODE REM PT RTRN 9FT ADLT (ELECTROSURGICAL) ×1 IMPLANT
EVACUATOR SILICONE 100CC (DRAIN) IMPLANT
GLOVE BIO SURGEON STRL SZ7.5 (GLOVE) ×4 IMPLANT
GLOVE BIOGEL PI IND STRL 6.5 (GLOVE) IMPLANT
GLOVE BIOGEL PI IND STRL 7.0 (GLOVE) IMPLANT
GLOVE BIOGEL PI IND STRL 7.5 (GLOVE) ×1 IMPLANT
GLOVE BIOGEL PI IND STRL 8 (GLOVE) IMPLANT
GLOVE BIOGEL PI INDICATOR 6.5 (GLOVE) ×3
GLOVE BIOGEL PI INDICATOR 7.0 (GLOVE) ×1
GLOVE BIOGEL PI INDICATOR 7.5 (GLOVE) ×1
GLOVE BIOGEL PI INDICATOR 8 (GLOVE) ×1
GLOVE ECLIPSE 7.0 STRL STRAW (GLOVE) ×1 IMPLANT
GLOVE SS BIOGEL STRL SZ 6.5 (GLOVE) IMPLANT
GLOVE SS N UNI LF 7.5 STRL (GLOVE) ×1 IMPLANT
GLOVE SUPERSENSE BIOGEL SZ 6.5 (GLOVE) ×1
GOWN PREVENTION PLUS XLARGE (GOWN DISPOSABLE) ×3 IMPLANT
GOWN STRL NON-REIN LRG LVL3 (GOWN DISPOSABLE) ×5 IMPLANT
GOWN STRL REIN XL XLG (GOWN DISPOSABLE) ×1 IMPLANT
KIT BASIN OR (CUSTOM PROCEDURE TRAY) ×2 IMPLANT
KIT ROOM TURNOVER OR (KITS) ×2 IMPLANT
LOOP VESSEL MAXI BLUE (MISCELLANEOUS) ×2 IMPLANT
MARKER GRAFT CORONARY BYPASS (MISCELLANEOUS) IMPLANT
NS IRRIG 1000ML POUR BTL (IV SOLUTION) ×4 IMPLANT
PACK PERIPHERAL VASCULAR (CUSTOM PROCEDURE TRAY) ×2 IMPLANT
PAD ARMBOARD 7.5X6 YLW CONV (MISCELLANEOUS) ×4 IMPLANT
PADDING CAST COTTON 6X4 STRL (CAST SUPPLIES) IMPLANT
SET COLLECT BLD 21X3/4 12 (NEEDLE) IMPLANT
SET COLLECT BLD 21X3/4 12 PB (MISCELLANEOUS) ×1 IMPLANT
SPONGE SURGIFOAM ABS GEL 100 (HEMOSTASIS) IMPLANT
STAPLER VISISTAT 35W (STAPLE) ×2 IMPLANT
STOPCOCK 4 WAY LG BORE MALE ST (IV SETS) ×1 IMPLANT
SUT PROLENE 5 0 C 1 24 (SUTURE) ×2 IMPLANT
SUT PROLENE 6 0 CC (SUTURE) ×4 IMPLANT
SUT PROLENE 7 0 BV 1 (SUTURE) ×1 IMPLANT
SUT PROLENE 7 0 BV1 MDA (SUTURE) IMPLANT
SUT SILK 2 0 SH (SUTURE) ×2 IMPLANT
SUT SILK 3 0 (SUTURE) ×4
SUT SILK 3-0 18XBRD TIE 12 (SUTURE) IMPLANT
SUT VIC AB 2-0 CTX 36 (SUTURE) ×4 IMPLANT
SUT VIC AB 3-0 SH 27 (SUTURE) ×8
SUT VIC AB 3-0 SH 27X BRD (SUTURE) ×2 IMPLANT
SUT VIC AB 3-0 X1 27 (SUTURE) ×1 IMPLANT
SUT VIC AB 4-0 PS2 27 (SUTURE) ×1 IMPLANT
SYR 3ML LL SCALE MARK (SYRINGE) ×1 IMPLANT
TAPE UMBILICAL COTTON 1/8X30 (MISCELLANEOUS) ×1 IMPLANT
TOWEL OR 17X24 6PK STRL BLUE (TOWEL DISPOSABLE) ×4 IMPLANT
TOWEL OR 17X26 10 PK STRL BLUE (TOWEL DISPOSABLE) ×4 IMPLANT
TRAY FOLEY CATH 16FRSI W/METER (SET/KITS/TRAYS/PACK) ×2 IMPLANT
TUBING ART PRESS 48 MALE/FEM (TUBING) ×1 IMPLANT
TUBING EXTENTION W/L.L. (IV SETS) ×1 IMPLANT
UNDERPAD 30X30 INCONTINENT (UNDERPADS AND DIAPERS) ×2 IMPLANT
WATER STERILE IRR 1000ML POUR (IV SOLUTION) ×2 IMPLANT

## 2013-01-06 NOTE — Preoperative (Signed)
Beta Blockers   Reason not to administer Beta Blockers:Not Applicable 

## 2013-01-06 NOTE — Progress Notes (Signed)
Pt transferred from PACU with RN and NT on monitor. Pt arousable, Ax4, able to move all ext, left upper and lower ext weaker than right, at baseline, DP/PT pulses dopplerable to left foot and palpable to right foot. Dressings to left leg show some shadowing, all marked, will continue to monitor.

## 2013-01-06 NOTE — Interval H&P Note (Signed)
History and Physical Interval Note:  01/06/2013 8:55 AM  Donald [name]  has presented today for surgery, with the diagnosis of Claudication  The various methods of treatment have been discussed with the patient and family. After consideration of risks, benefits and other options for treatment, the patient has consented to  Procedure(s): BYPASS GRAFT FEMORAL-POPLITEAL ARTERY POSSIBLE REVISION AX-FEM BPG (Left) as a surgical intervention .  The patient's history has been reviewed, patient examined, no change in status, stable for surgery.  I have reviewed the patient's chart and labs.  Questions were answered to the patient's satisfaction.     Alden Feagan E

## 2013-01-06 NOTE — Anesthesia Postprocedure Evaluation (Signed)
  Anesthesia Post-op Note  Patient: Donald [name]  Procedure(s) Performed: Procedure(s): Femoral-Popliteal Artery Bypass (Left) INTRA OPERATIVE ARTERIOGRAM (Left)  Patient Location: PACU  Anesthesia Type:General  Level of Consciousness: awake, alert , oriented and patient cooperative  Airway and Oxygen Therapy: Patient Spontanous Breathing and Patient connected to nasal cannula oxygen  Post-op Pain: none  Post-op Assessment: Post-op Vital signs reviewed, Patient's Cardiovascular Status Stable, Respiratory Function Stable, Patent Airway, No signs of Nausea or vomiting and Pain level controlled  Post-op Vital Signs: Reviewed and stable  Complications: R forearm PIV infiltration, IV removed and dressed

## 2013-01-06 NOTE — OR Nursing (Signed)
Dr Jean Rosenthal notified of h/h 9.7/29.7 / no add'l transfusion

## 2013-01-06 NOTE — Anesthesia Preprocedure Evaluation (Addendum)
Anesthesia Evaluation  Patient identified by MRN, date of birth, ID band Patient awake    Reviewed: Allergy & Precautions, H&P , NPO status , Patient's Chart, lab work & pertinent test results  Airway Mallampati: II      Dental  (+) Edentulous Upper, Partial Lower and Dental Advisory Given   Pulmonary  breath sounds clear to auscultation        Cardiovascular Rhythm:Regular Rate:Normal     Neuro/Psych    GI/Hepatic   Endo/Other    Renal/GU      Musculoskeletal   Abdominal   Peds  Hematology   Anesthesia Other Findings   Reproductive/Obstetrics                           Anesthesia Physical Anesthesia Plan  ASA: III  Anesthesia Plan: General   Post-op Pain Management:    Induction: Intravenous  Airway Management Planned: Oral ETT  Additional Equipment: Arterial line  Intra-op Plan:   Post-operative Plan: Extubation in OR  Informed Consent: I have reviewed the patients History and Physical, chart, labs and discussed the procedure including the risks, benefits and alternatives for the proposed anesthesia with the patient or authorized representative who has indicated his/her understanding and acceptance.   Dental advisory given  Plan Discussed with: CRNA and Anesthesiologist  Anesthesia Plan Comments: (1.PVD S/P L. Ax-Bifem BPG now with persistent claudication LLE 2. Type 2 DM glucose 152 3. CAD chronic occlusion of RCA by cath with possible mild ischemia on nuclear stress test 01/24/12, Ax-bifem BPG on 01/29/12 without difficulty 4. Smoker 5.Htn 6. S/P CVA 2008 with L. Hemiparesis  7. H/O Rectal Ca with liver met  Plan GA with art line and oral ETT  Kipp Brood, MD)      Anesthesia Quick Evaluation

## 2013-01-06 NOTE — Transfer of Care (Signed)
Immediate Anesthesia Transfer of Care Note  Patient: Donald [name]  Procedure(s) Performed: Procedure(s): Femoral-Popliteal Artery Bypass (Left) PATCH ANGIOPLASTY (Left)  Patient Location: PACU  Anesthesia Type:General  Level of Consciousness: awake  Airway & Oxygen Therapy: Patient Spontanous Breathing and Patient connected to nasal cannula oxygen  Post-op Assessment: Report given to PACU RN and Post -op Vital signs reviewed and stable  Post vital signs: Reviewed and stable  Complications: No apparent anesthesia complications

## 2013-01-06 NOTE — Anesthesia Procedure Notes (Addendum)
Procedure Name: Intubation Date/Time: 01/06/2013 1:18 PM Performed by: Gavin Pound, HAL J Pre-anesthesia Checklist: Patient identified, Timeout performed, Emergency Drugs available, Suction available and Patient being monitored Patient Re-evaluated:Patient Re-evaluated prior to inductionOxygen Delivery Method: Circle system utilized Preoxygenation: Pre-oxygenation with 100% oxygen Intubation Type: IV induction Ventilation: Mask ventilation without difficulty Laryngoscope Size: Mac and 3 Grade View: Grade I Tube type: Oral Tube size: 7.5 mm Number of attempts: 1 Secured at: 23 cm Tube secured with: Tape Dental Injury: Teeth and Oropharynx as per pre-operative assessment    CVP: Timeout, sterile prep, drape, FBP R neck.  Trendelenburg position.  1% lido local, finder and trocar RIJ 2nd pass with US guidance (unable to pass wire on 1st attempt).  2 lumen placed over J wire. Biopatch and sterile dressing on.  Patient tolerated well.  VSS.  Sandford Craze, MD  19:00-19:14

## 2013-01-06 NOTE — OR Nursing (Signed)
r forearm PIV infiltrated/ will have central line placed by Dr Sandford Craze MDA now

## 2013-01-06 NOTE — H&P (View-Only) (Signed)
HPI: Patient is a 67 year old male who underwent axillary bifemoral bypass 01/29/2012. His wife report a new blister on the tip of his left great toe due to new shoe ware that is healing fine and a superficial scrape on the lateral malleolus. He returns today for followup after recent CT angiogram with runoff. He thinks that the toes have gotten worse. He still has pain in the left foot. He is fairly debilitated overall from prior stroke. He is able to transfer but is not able to ambulate much due to left-sided weakness.  Past Medical History  Diagnosis Date  . GERD (gastroesophageal reflux disease)   . Hyperlipidemia   . Hypertension   . Peripheral vascular disease   . Type II diabetes mellitus   . Pneumonia   . Internal and external bleeding hemorrhoids   . Stroke 2008    "left side weak; unable to move left hand still" (01/23/2012)  . History of gout     left great toe  . anal ca dx'd 08/2007    S/P chemo, radiation, biopsies  . Metastases to the liver dx'd 08/2010    Past Surgical History  Procedure Laterality Date  . Carotid endarterectomy  2008    right  . Liver cryoablation  08/2010    "chemo shrunk the cancer 50% then they went in and burned the rest of it"  . Mass biopsy      rectal; "found cancer"  . Axillary-femoral bypass graft  01/29/2012    Procedure: BYPASS GRAFT AXILLA-BIFEMORAL;  Surgeon: Sherren Kerns, MD;  Location: [facility] OR;  Service: Vascular;  Laterality: N/A;  Left axilla-bifemoral bypass using gore-tex graft.     Current Outpatient Prescriptions on File Prior to Visit  Medication Sig Dispense Refill  . acidophilus (RISAQUAD) CAPS Take by mouth daily.        . Ascorbic Acid (VITAMIN C) 1000 MG tablet Take 1,000 mg by mouth daily.      Marland Kitchen buPROPion (ZYBAN) 150 MG 12 hr tablet Take 150 mg by mouth daily.       Marland Kitchen dipyridamole-aspirin (AGGRENOX) 200-25 MG per 12 hr capsule Take 1 capsule by mouth 2 (two) times daily.  180 capsule  1  . docusate sodium (COLACE) 100 MG  capsule Take 200 mg by mouth 2 (two) times daily.      Marland Kitchen gabapentin (NEURONTIN) 300 MG capsule Take 1 capsule (300 mg total) by mouth 3 (three) times daily.  90 capsule  3  . lisinopril (PRINIVIL,ZESTRIL) 5 MG tablet Take 0.5 tablets (2.5 mg total) by mouth daily.      . metFORMIN (GLUMETZA) 500 MG (MOD) 24 hr tablet Take 500 mg by mouth 2 (two) times daily with a meal.       . omega-3 acid ethyl esters (LOVAZA) 1 G capsule Take 2 g by mouth 2 (two) times daily.        Marland Kitchen omeprazole (PRILOSEC) 20 MG capsule Take 40 mg by mouth daily.       Marland Kitchen OVER THE COUNTER MEDICATION        No current facility-administered medications on file prior to visit.   Allergies  Allergen Reactions  . Other     Became hypotensive. Pt states allergic to "muscle relaxer" but unsure of name.  . Tape     medfix non woven tape     Objective   Filed Vitals:   01/01/13 1123  BP: 143/50  Pulse: 82  Height: 5\' 9"  (1.753 m)  Weight:  184 lb 3.2 oz (83.553 kg)  SpO2: 100%   Doppler flow left axillary femoral bypass graft, palpable femoral pulses bilaterally  Extremities: No palpable pedal pulses bilaterally dusky blister tip of first and second toe left foot Chest: Clear to auscultation bilaterally  Cardiac: Regular rate and without murmur  Data: CT Angio images of the chest abdomen pelvis with runoff were reviewed.  The proximal axillary anastomosis was clipped off the page and is not visualized.   The left common iliac artery, left external iliac artery and proximal left internal iliac artery are occluded.  Right Lower Extremity: The right common iliac artery at least moderate narrowing at the origin of the right external iliac artery and diffuse circumferential plaque in the distal external iliac artery. Near occlusion of the distal right external iliac artery. There is a patent femoral to femoral bypass graft. Circumferential plaque in the distal right common femoral artery with flow in the deep femoral arteries.  The right superficial femoral artery is occluded at the origin. There is distal reconstitution of the superficial femoral artery in the lower thigh. Significant narrowing of the superficial femoral artery near the adductor canal. The right popliteal artery is patent and there is three-vessel runoff in the right calf. There is flow from the dorsalis pedis artery and posterior tibial artery below the ankle.Left Lower Extremity: There is a critical stenosis in the left common femoral artery just distal to the femoral bypass graft. Origin of the left superficial femoral artery is occluded. There is flow in the deep femoral arteries. Distal reconstitution of left superficial femoral artery. Diffuse narrowing of the popliteal artery. Popliteal artery is patent with three-vessel runoff in the left calf. The dorsalis pedis artery is occluded at the ankle.   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 He now has a limb threatening situation with ulcerations and blistering of his left first and second toe with decreased ABIs. He has evidence of common femoral artery occlusive disease below the anastomosis. He also has a proximal SFA occlusion. He also has popliteal artery occlusive disease. Apparently there is three-vessel runoff to the right foot. I believe the best option would be a left femoral to below-knee popliteal bypass with vein if this is available conduit. If not we will use prosthetic. We'll assess the adequacy of his inflow on the table. If the inflow was found to be poor then we would possibly also revise the proximal anastomosis as well. Most likely I will only address the outflow procedure in this setting. We would consider an arteriogram to evaluate the inflow later on if it is not adequate.  Fabienne Bruns, MD  [facility] [place]  Office: [phone]  Pager:  [phone]

## 2013-01-06 NOTE — Op Note (Signed)
Procedure: Left femoral to below knee popliteal artery bypass with non reversed greater saphenous vein, patch angioplasty left common femoral artery, intraop agram  Preop diagnosis: non healing wound left foot  Postop: same  Anesthesia: General  Asst: Cari Caraway, MD, Della Goo PA-C  Findings: 1.  Patent ax bifem with 20 mm gradient compared to right radial aline  2. 3 mm greater saphenous vein  3.  3 vessel runoff with small tibial vessels  Operative details:   After obtaining informed consent, the patient was taken to the operating room. The patient was placed in supine position on the operating room table. After induction of general anesthesia and endotracheal intubation, a Foley catheter was placed. Next, the patient's entire left lower extremity was prepped and draped in the usual sterile fashion. A longitudinal incision was then made in the left groin and carried down through the subcutaneous tissues to expose the left common femoral artery.  The patient had a prior ax bi fem so there was some scar which made dissection fairly tedious.  The femoral vein was adherent to the medial side of the artery.  The left axillary limb of the bypass, the femoral femoral bypass takeoff, the native proximal common femoral profunda and SFA were all dissected free circumferentially and vessel loops places around them.  There was a pulse within the left axillary limb of the graft.  A 21 gauge butterfly needle was inserted into this and pressure compared to the right radial aline.  There was a 20 mm gradient but the quality of the pulse was reasonable.  The catheter was removed and the hole repaired with a 6 0 prolene suture.  The distal external iliac artery was dissected free circumferentially underneath the inguinal ligament.  A vessel loop was also placed around the distal external iliac artery.   Next the saphenofemoral junction was identified in the medial portion of the groin incision and this was  harvested through several skip incisions on the medial aspect of the leg.  Side branches were ligated and divided between silk ties or clips.  The vein was of good quality 3 mm diameter  The vein harvest incision was deepened into the fascia at the below knee segment and the below knee popliteal space was entered.  The popliteal artery was dissected free circumferentially.  It was thickened but was clampable.  A tunnel was then created between the heads of the gastrocnemius muscle subsartorial up to the groin.  The vein was ligated distally and transected.  The vein was divided at the saphenofemoral junction and oversewn with a 5 0 prolene.  The vein was gently distended with heparinized saline and inspected for hemostasis.    The patient was given 10000 units of heparin.  After appropriate circulation time, the distal right external iliac artery was controlled with a vessel loop. The superficial femoral artery and profunda were controlled with vessel loops.  The axillary limb of the bypass was controlled with a Henley clamp just below the fem fem takeoff.   A longitudinal opening was made in the hood of the old graft and extended onto the native common femoral and the origin of the SFA.  There was some laminated thrombus and intimal hyperplasia in the native common femoral and profunda.  This was removed under direct vision.  The profunda was small but there was good backbleeding.  A number 5 Fogarty was passed up the axillary graft to make sure there was retained thrombus.  There was very good  inflow. The vein was placed in a non reversed configuration.  The arteriotomy was extended with Pott's scissors.  The vein was spatulated and sewn end to side with the toe of the anastomosis extending onto the hood of the ax fem graft but also opened longitudinally several centimeters so that the proximal anastomosis acted as a patch down onto the native femoral bifurcation and the heel just onto the SFA.  Just prior to  completion anastomosis everything was forebled backbled and thoroughly flushed. Proximal clamp and distal clamps were removed and there was good pulsatile flow in the profunda femoris artery immediately. The SFA was chronically occluded.    The graft was then brought through the subsartorial tunnel down to the below-knee popliteal artery after marking for orientation. The below-knee popliteal artery was controlled proximally and distally with a Henley clamp. A longitudinal opening was made in the distal below-knee popliteal artery in an area that was fairly free of calcification. The graft was then cut to length and spatulated and sewn end of graft to side of artery using running 6-0 Prolene suture.  At completion of the anastomosis everything was forebled backbled and thoroughly flushed. The remainder of the anastomosis was completed and all clamps were removed restoring pulsatile flow to the below-knee popliteal artery. An intraoperative arteriogram was obtained by introducing a 21 gauge butterfly needle into the proximal vein graft.  This was thoroughly flushed with heparinized saline and then with inflow occlusion 20 cc of contrast was injected.  This showed a patent distal anastomosis with 3 vessel runoff with small tibials.  The patient had monophasic to biphasic Doppler flow in the posterior tibial area of the foot. One repair stitch was placed in the lateral wall of the proximal anastomosis and at the medial heel of the distal anastomosis.  The patient had been given an additional 5000 units of heparin during the case.    After hemostasis was obtained, the deep layers and subcutaneous layers of the below-knee popliteal incision were closed with running 3-0 Vicryl suture. The skin was closed with staples.   The saphenectomy incisions were closed with running 3 0 vicryl follow by staples.  The groin was inspected and found to be hemostatic. This was then closed in multiple layers of running 2 0 and 3-0  Vicryl suture and 4-0 subcuticular stitch. Dermabond was applied.  The patient tolerated the procedure well and there were no complications. Instrument sponge and needle counts correct in the case. Patient was taken to the recovery in stable condition.  Fabienne Bruns, MD [facility] [place] Office: [phone] Pager: [phone]

## 2013-01-06 NOTE — Progress Notes (Signed)
500cc NS bolus started

## 2013-01-07 ENCOUNTER — Telehealth: Payer: Self-pay | Admitting: Vascular Surgery

## 2013-01-07 ENCOUNTER — Encounter (HOSPITAL_COMMUNITY): Payer: Self-pay | Admitting: *Deleted

## 2013-01-07 LAB — CBC
Platelets: 180 10*3/uL (ref 150–400)
RBC: 3.28 MIL/uL — ABNORMAL LOW (ref 4.22–5.81)
RDW: 15.3 % (ref 11.5–15.5)
WBC: 8.3 10*3/uL (ref 4.0–10.5)

## 2013-01-07 LAB — GLUCOSE, CAPILLARY
Glucose-Capillary: 203 mg/dL — ABNORMAL HIGH (ref 70–99)
Glucose-Capillary: 123 mg/dL — ABNORMAL HIGH (ref 70–99)
Glucose-Capillary: 116 mg/dL — ABNORMAL HIGH (ref 70–99)

## 2013-01-07 LAB — BASIC METABOLIC PANEL
CO2: 24 mEq/L (ref 19–32)
Chloride: 105 mEq/L (ref 96–112)
Creatinine, Ser: 1.12 mg/dL (ref 0.50–1.35)
GFR calc Af Amer: 76 mL/min — ABNORMAL LOW (ref >90)
Potassium: 4.5 mEq/L (ref 3.5–5.1)
Sodium: 137 mEq/L (ref 135–145)

## 2013-01-07 LAB — HEMOGLOBIN A1C: Mean Plasma Glucose: 166 mg/dL — ABNORMAL HIGH (ref <117)

## 2013-01-07 MED ORDER — INFLUENZA VAC SPLIT QUAD 0.5 ML IM SUSP
0.5000 mL | INTRAMUSCULAR | Status: AC
  Filled 2013-01-07: qty 0.5

## 2013-01-07 MED ORDER — PNEUMOCOCCAL VAC POLYVALENT 25 MCG/0.5ML IJ INJ
0.5000 mL | INJECTION | INTRAMUSCULAR | Status: AC
  Filled 2013-01-07: qty 0.5

## 2013-01-07 MED ORDER — SODIUM CHLORIDE 0.9 % IJ SOLN
10.0000 mL | INTRAMUSCULAR | Status: DC | PRN
  Administered 2013-01-07 – 2013-01-13 (×9): 10 mL

## 2013-01-07 NOTE — Progress Notes (Signed)
Physical Therapy Evaluation Patient Details Name: Donald [name] MRN: [id number] DOB: 1946/02/19 Today's Date: 01/07/2013 Time: 0951-1009 PT Time Calculation (min): 18 min  PT Assessment / Plan / Recommendation History of Present Illness  Pt admit for left fem pop BPG.    Clinical Impression  Pt admitted with above. Pt currently with functional limitations due to the deficits listed below (see PT Problem List). Pt limited by left LE pain and LLE hemibody weakness.  May need CIR.   Pt will benefit from skilled PT to increase their independence and safety with mobility to allow discharge to the venue listed below.     PT Assessment  Patient needs continued PT services    Follow Up Recommendations  Supervision/Assistance - 24 hour;CIR                Equipment Recommendations  None recommended by PT    Recommendations for Other Services Rehab consult   Frequency Min 3X/week    Precautions / Restrictions Precautions Precautions: Fall Restrictions Weight Bearing Restrictions: No   Pertinent Vitals/Pain VSS, 8/10 left LE pain      Mobility  Bed Mobility Bed Mobility: Not assessed Details for Bed Mobility Assistance: pt in chair on arrival. Transfers Transfers: Sit to Stand;Stand to Sit Sit to Stand: 3: Mod assist;With upper extremity assist;With armrests;From chair/3-in-1 Stand to Sit: 3: Mod assist;With upper extremity assist;To chair/3-in-1;With armrests Details for Transfer Assistance: Pt needed cues for hand placement.  Pt had difficulty with sit to stand secondary to weakness of left UE and pain in LLE with sit to stand.  Instability in sit to stand.  Took incr time for pt to achieve postural stability once standing as well.   Ambulation/Gait Ambulation/Gait Assistance: 4: Min assist;3: Mod assist Ambulation Distance (Feet): 35 Feet Assistive device: Rolling walker Ambulation/Gait Assistance Details: Pt ambulated 5 feet and then stated he was tired.  Then he  ambulated 30 feet second attempt.  Pt needed assist to hold his left UE on RW.  Pt with poor quality of movement of left LE with ambulation as well.  Pt unsteady with gait overall needing min to mod assist for stability.  Gait Pattern: Step-to pattern;Decreased step length - left;Decreased weight shift to left;Shuffle;Trunk flexed;Wide base of support Gait velocity: decreased Stairs: No Wheelchair Mobility Wheelchair Mobility: No         PT Diagnosis: Generalized weakness  PT Problem List: Decreased activity tolerance;Decreased balance;Decreased mobility;Decreased strength;Decreased knowledge of use of DME;Decreased safety awareness;Decreased knowledge of precautions;Pain PT Treatment Interventions: DME instruction;Gait training;Functional mobility training;Therapeutic activities;Therapeutic exercise;Balance training;Cognitive remediation;Patient/family education;Stair training     PT Goals(Current goals can be found in the care plan section) Acute Rehab PT Goals Patient Stated Goal: to go home PT Goal Formulation: With patient Time For Goal Achievement: 01/21/13 Potential to Achieve Goals: Good  Visit Information  Last PT Received On: 01/07/13 Assistance Needed: +2 History of Present Illness: Pt admit for left fem pop BPG.         Prior Functioning  Home Living Family/patient expects to be discharged to:: Private residence Living Arrangements: Spouse/significant other Available Help at Discharge: Family Type of Home: House Home Access: Stairs to enter Secretary/administrator of Steps: 10 Entrance Stairs-Rails: Right;Left Home Layout: Two level Alternate Level Stairs-Number of Steps: flight Alternate Level Stairs-Rails: Right;Left (chair lift) Home Equipment: Walker - 2 wheels;Cane - single point;Bedside commode;Shower seat;Hand held shower head;Grab bars - tub/shower Additional Comments: Pt has a stair chair lift now for going upstairs.  Prior Function Level of  Independence: Independent with assistive device(s) Comments: used cane PTA Communication Communication: No difficulties Dominant Hand: Right    Cognition  Cognition Arousal/Alertness: Awake/alert Behavior During Therapy: WFL for tasks assessed/performed Overall Cognitive Status: Within Functional Limits for tasks assessed    Extremity/Trunk Assessment Upper Extremity Assessment Upper Extremity Assessment: Defer to OT evaluation Lower Extremity Assessment Lower Extremity Assessment: LLE deficits/detail LLE Deficits / Details: grossly 3-/5 with tone noted LLE Coordination: decreased gross motor   Balance Balance Balance Assessed: Yes Static Standing Balance Static Standing - Balance Support: Bilateral upper extremity supported;During functional activity Static Standing - Level of Assistance: 4: Min assist Static Standing - Comment/# of Minutes: 2  End of Session PT - End of Session Equipment Utilized During Treatment: Gait belt Activity Tolerance: Patient limited by pain Patient left: in chair;with call bell/phone within reach Nurse Communication: Mobility status       INGOLD,Donald [name] 01/07/2013, 2:33 PM  [facility] [phone] (pager)

## 2013-01-07 NOTE — Progress Notes (Signed)
[facility] [place]  Progress Note Bypass Surgery  Date of Surgery: 01/06/2013  Procedure(s): Left femoral to below knee popliteal artery bypass with non reversed greater saphenous vein, patch angioplasty left common femoral artery,  INTRA OPERATIVE ARTERIOGRAM Surgeon: Surgeon(s): Sherren Kerns, MD Chuck Hint, MD  1 Day Post-Op  History of Present Illness  HABIB KISE is a 67 y.o. male who is  1 Day Post-Op. The patient's pre-op symptoms of pain in foot are Improved . Patients pain is well controlled.    VASC. LAB Studies:        ABI: Pend   Significant Diagnostic Studies: CBC Lab Results  Component Value Date   WBC 8.3 01/07/2013   HGB 8.8* 01/07/2013   HCT 26.8* 01/07/2013   MCV 81.7 01/07/2013   PLT 180 01/07/2013     Physical Examination  BP Readings from Last 3 Encounters:  01/07/13 99/43  01/07/13 99/43  01/05/13 142/59   Temp Readings from Last 3 Encounters:  01/07/13 98.9 F (37.2 C) Oral  01/07/13 98.9 F (37.2 C) Oral  01/02/13 98.3 F (36.8 C)    SpO2 Readings from Last 3 Encounters:  01/07/13 95%  01/07/13 95%  01/02/13 94%   Pulse Readings from Last 3 Encounters:  01/07/13 85  01/07/13 85  01/05/13 92    Pt is A&O x 3 left lower extremity: Incision/s is/are clean,dry.intact, and  healing without hematoma, erythema or drainage Limb is warm; with good color Left great toe stable  Left Dorsalis Pedis pulse is monophasic by Doppler Left Posterior tibial pulse is  monophasic by Doppler  Assessment/Plan: Pt. Doing well Post-op pain is controlled Wounds are healing well PT/OT for ambulation Continue wound care as ordered Transfer to 2W  Malek Skog J  01/07/2013 8:19 AM

## 2013-01-07 NOTE — Progress Notes (Signed)
Utilization review completed.  

## 2013-01-07 NOTE — Progress Notes (Signed)
Pt to TX to 2W-09, VSS, called report. Changed L toe dsg, d/c covaderms L leg.

## 2013-01-07 NOTE — Progress Notes (Addendum)
[facility] [place]  Subjective  - Left foot feels better   Objective 99/43 85 98.9 F (37.2 C) (Oral) 11 95%  Intake/Output Summary (Last 24 hours) at 01/07/13 0820 Last data filed at 01/07/13 0600  Gross per 24 hour  Intake   4000 ml  Output   2115 ml  Net   1885 ml   Dressings dry, no hematoma Doppler PT/DP left Brisk cap refill right foot  Assessment/Planning: 1.  Hypotension had fluid challenge this am.  Hold lisinopril today.  If persists will consider transfusion for acute blood loss anemia  2.  Diabetes: hold metformin for now since he got contrast yesterday  3. OOB ambulate  4.  Will consider repeat CTA to evaluate ax fem, subclavian inflow on Friday.  5. Transfer 2000  Donald [name] E 01/07/2013 8:20 AM --  Laboratory Lab Results:  Recent Labs  01/06/13 1912 01/07/13 0405  WBC  --  8.3  HGB 9.7* 8.8*  HCT 29.7* 26.8*  PLT  --  180   BMET  Recent Labs  01/06/13 1619 01/07/13 0405  NA 139 137  K 4.7 4.5  CL  --  105  CO2  --  24  GLUCOSE 174* 159*  BUN  --  12  CREATININE  --  1.12  CALCIUM  --  8.0*    COAG Lab Results  Component Value Date   INR 1.06 01/02/2013   INR 1.05 01/24/2012   INR 0.98 01/23/2012   No results found for this basename: PTT

## 2013-01-07 NOTE — Telephone Encounter (Addendum)
Message copied by Rosalyn Charters on Wed Jan 07, 2013  9:17 AM ------      Message from: [name]      Created: Tue Jan 06, 2013  5:49 PM       2 week nurse visit on a Thursday for staple removal            3-4 weeks Fem-pop F/U - Fields ------  notified patient's wife of fu appt. on 01-22-13 10am for staple removal and then 02-05-13 10:45 am post op visit

## 2013-01-07 NOTE — Progress Notes (Signed)
Rehab Admissions Coordinator Note:  Patient was screened by Clois Dupes for appropriateness for an Inpatient Acute Rehab Consult.  At this time, we are recommending Inpatient Rehab consult.  Clois Dupes 01/07/2013, 8:34 PM  I can be reached at [phone].

## 2013-01-07 NOTE — Progress Notes (Signed)
Occupational Therapy Evaluation Patient Details Name: KEADEN GUNNOE MRN: [id number] DOB: 20-Dec-1945 Today's Date: 01/07/2013 Time: 4098-1191 OT Time Calculation (min): 17 min  OT Assessment / Plan / Recommendation History of present illness Pt admit for left fem pop BPG.     Clinical Impression   Pt with significant decline in functional status. PTA, pt required occasional min A for ADL and was Mod I with mobility. Eval limited due to pain. Will continue in am. Supportive family -  Feel pt would benefit from short CIR stay to return to PLOF. Pt will benefit from skilled OT services to facilitate D/C to next venue due to below deficits.    OT Assessment  Patient needs continued OT Services    Follow Up Recommendations  CIR    Barriers to Discharge   house isnot w/c accessible  Equipment Recommendations  Other (comment)    Recommendations for Other Services Rehab consult  Frequency  Min 2X/week    Precautions / Restrictions Precautions Precautions: Fall Restrictions Weight Bearing Restrictions: No   Pertinent Vitals/Pain Pain 7/10. premedicated    ADL  Eating/Feeding: Supervision/safety;Set up Grooming: Minimal assistance Where Assessed - Grooming: Supported sitting Upper Body Bathing: Moderate assistance Where Assessed - Upper Body Bathing: Supported sitting Lower Body Bathing: +1 Total assistance Where Assessed - Lower Body Bathing: Supported sit to stand Upper Body Dressing: Moderate assistance Where Assessed - Upper Body Dressing: Unsupported sitting Lower Body Dressing: +1 Total assistance Where Assessed - Lower Body Dressing: Supported sit to Pharmacist, hospital:  (not assessed due to pain) Transfers/Ambulation Related to ADLs: pt declined ADL Comments: limited by weakness and pain    OT Diagnosis: Generalized weakness;Acute pain;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)  OT Problem List: Decreased strength;Decreased range of motion;Decreased activity  tolerance;Impaired balance (sitting and/or standing);Decreased knowledge of use of DME or AE;Decreased knowledge of precautions;Impaired sensation;Impaired tone;Obesity;Impaired UE functional use;Pain;Increased edema OT Treatment Interventions: Self-care/ADL training;Therapeutic exercise;Neuromuscular education;Energy conservation;DME and/or AE instruction;Therapeutic activities;Cognitive remediation/compensation;Patient/family education;Balance training   OT Goals(Current goals can be found in the care plan section) Acute Rehab OT Goals Patient Stated Goal: to go home OT Goal Formulation: With patient/family Time For Goal Achievement: 01/21/13 Potential to Achieve Goals: Good  Visit Information  Last OT Received On: 01/07/13 Assistance Needed: +2 History of Present Illness: Pt admit for left fem pop BPG.         Prior Functioning     Home Living Family/patient expects to be discharged to:: Private residence Living Arrangements: Spouse/significant other Available Help at Discharge: Family Type of Home: House Home Access: Stairs to enter Secretary/administrator of Steps: 10 Entrance Stairs-Rails: Right;Left Home Layout: Two level;Bed/bath upstairs Alternate Level Stairs-Number of Steps: flight Alternate Level Stairs-Rails: Right;Left (chair lift) Home Equipment: Walker - 2 wheels;Cane - single point;Bedside commode;Shower seat;Hand held shower head;Grab bars - tub/shower Additional Comments: Pt has a stair chair lift now for going upstairs.  Prior Function Level of Independence: Independent with assistive device(s) Comments: used cane PTA Communication Communication: No difficulties Dominant Hand: Right         Vision/Perception Vision - History Baseline Vision: No visual deficits   Cognition  Cognition Arousal/Alertness: Awake/alert Behavior During Therapy: WFL for tasks assessed/performed Overall Cognitive Status: Within Functional Limits for tasks assessed     Extremity/Trunk Assessment Upper Extremity Assessment Upper Extremity Assessment: LUE deficits/detail LUE Deficits / Details: residual hemiparesis from CVA. unable to use L hand (decreased ROM L shoulder. c/o painful L shoulder) LUE: Unable to fully assess due to  pain LUE Sensation: decreased light touch;decreased proprioception LUE Coordination: decreased fine motor;decreased gross motor Lower Extremity Assessment Lower Extremity Assessment: LLE deficits/detail LLE Deficits / Details: grossly 3-/5 with tone noted LLE Coordination: decreased gross motor     Mobility Bed Mobility Bed Mobility: Not assessed Details for Bed Mobility Assistance: pt in chair on arrival. Transfers Sit to Stand: 3: Mod assist;With upper extremity assist;With armrests;From chair/3-in-1 Stand to Sit: 3: Mod assist;With upper extremity assist;To chair/3-in-1;With armrests Details for Transfer Assistance:  (declined due to pain. Per PT, pt Mod A)     Exercise     Balance Balance Balance Assessed: Yes Static Standing Balance Static Standing - Balance Support: Bilateral upper extremity supported;During functional activity Static Standing - Level of Assistance: 4: Min assist Static Standing - Comment/# of Minutes: 2   End of Session OT - End of Session Activity Tolerance: Patient limited by pain Patient left: in bed;with call bell/phone within reach;with family/visitor present Nurse Communication: Precautions  GO     Lameeka Schleifer,HILLARY 01/07/2013, 5:40 PM [facility], OTR/L  [phone] 01/07/2013

## 2013-01-08 DIAGNOSIS — R5381 Other malaise: Secondary | ICD-10-CM

## 2013-01-08 DIAGNOSIS — I739 Peripheral vascular disease, unspecified: Secondary | ICD-10-CM

## 2013-01-08 DIAGNOSIS — Z09 Encounter for follow-up examination after completed treatment for conditions other than malignant neoplasm: Secondary | ICD-10-CM

## 2013-01-08 LAB — BASIC METABOLIC PANEL
BUN: 9 mg/dL (ref 6–23)
Chloride: 103 mEq/L (ref 96–112)
Creatinine, Ser: 1.11 mg/dL (ref 0.50–1.35)
GFR calc Af Amer: 77 mL/min — ABNORMAL LOW (ref >90)
GFR calc non Af Amer: 66 mL/min — ABNORMAL LOW (ref >90)

## 2013-01-08 LAB — CBC
MCHC: 32.2 g/dL (ref 30.0–36.0)
MCV: 82.3 fL (ref 78.0–100.0)
Platelets: 84 10*3/uL — ABNORMAL LOW (ref 150–400)
RDW: 15.9 % — ABNORMAL HIGH (ref 11.5–15.5)
WBC: 7.9 10*3/uL (ref 4.0–10.5)

## 2013-01-08 LAB — GLUCOSE, CAPILLARY: Glucose-Capillary: 162 mg/dL — ABNORMAL HIGH (ref 70–99)

## 2013-01-08 MED ORDER — FUROSEMIDE 10 MG/ML IJ SOLN
20.0000 mg | Freq: Once | INTRAMUSCULAR | Status: AC
  Administered 2013-01-08: 20 mg via INTRAVENOUS

## 2013-01-08 MED ORDER — FUROSEMIDE 10 MG/ML IJ SOLN
INTRAMUSCULAR | Status: AC
  Filled 2013-01-08: qty 4

## 2013-01-08 NOTE — Progress Notes (Signed)
VASCULAR LAB PRELIMINARY  ARTERIAL  ABI completed:    RIGHT    LEFT    PRESSURE WAVEFORM  PRESSURE WAVEFORM  BRACHIAL 133 Triphasic BRACHIAL 102 Triphasic  DP 96 Monophasic DP 46 Monophasic  PT 82 Monophasic PT 110 Monophasic    RIGHT LEFT  ABI 0.72 0.83   ABIs indicate a moderate reduction in arterial flow on the right and a mild reduction on the left. Left Dorsalis Pedis indicates a severe reduction in arterial flow. There is a cardiac arrythmia which may adversely affect the pressure readings throughout bilaterally.  Donald [name], RVS 01/08/2013, 10:34 AM

## 2013-01-08 NOTE — Consult Note (Signed)
Physical Medicine and Rehabilitation Consult Reason for Consult: Deconditioning after left FPBG Referring Physician: Dr. Darrick Penna   HPI: Donald [name] is a 67 y.o. right-handed male well known to rehabilitation services from admission October 2013 for deconditioning related to multiple medical issues, peripheral vascular disease status post axillary bifemoral bypass 01/29/2012 as well as CVA. Admitted 01/06/2013 with a new blister tip of his left great toe and increased rest pain. CT angioplasty lower extremity show critical stenosis left common femoral artery just distal to the femoral bypass graft. Underwent a femoral to below knee popliteal artery bypass with patch angioplasty left common femoral artery 01/06/2013 per Dr. Darrick Penna. Acute blood loss anemia 8.8 and plan transfusion. Physical occupational therapy evaluations completed an ongoing was noted deconditioning. M.D. as requested physical medicine rehabilitation consult to consider inpatient rehabilitation services   Review of Systems  Cardiovascular: Positive for leg swelling.  Gastrointestinal: Positive for constipation.       GERD  Neurological: Positive for weakness.  Psychiatric/Behavioral: Positive for depression.  All other systems reviewed and are negative.   Past Medical History  Diagnosis Date  . GERD (gastroesophageal reflux disease)   . Hyperlipidemia   . Hypertension   . Peripheral vascular disease   . Type II diabetes mellitus   . Pneumonia   . Internal and external bleeding hemorrhoids   . Stroke 2008    "left side weak; unable to move left hand still" (01/23/2012)  . History of gout     left great toe  . anal ca dx'd 08/2007    S/P chemo, radiation, biopsies  . 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 Depression   . Numbness in right leg     Hx: of diabetes  . Coronary artery disease     RCA occlusion with good collaterals, o/w no obs CAD 12/2011   Past Surgical History  Procedure Laterality Date  .  Carotid endarterectomy  2008    right  . Liver cryoablation  08/2010    "chemo shrunk the cancer 50% then they went in and burned the rest of it"  . Mass biopsy      rectal; "found cancer"  . Axillary-femoral bypass graft  01/29/2012    Procedure: BYPASS GRAFT AXILLA-BIFEMORAL;  Surgeon: Sherren Kerns, MD;  Location: [facility] OR;  Service: Vascular;  Laterality: N/A;  Left axilla-bifemoral bypass using gore-tex graft.   . Colonoscopy w/ biopsies and polypectomy      Hx: of   Family History  Problem Relation Age of Onset  . Cancer - Other Sister    Social History:  reports that he has been smoking Cigarettes.  He has a 5 pack-year smoking history. He has never used smokeless tobacco. He reports that he does not drink alcohol or use illicit drugs. Allergies:  Allergies  Allergen Reactions  . Other     Became hypotensive. Pt states allergic to "muscle relaxer" but unsure of name.  . Tape Hives and Itching    medfix non woven tape   Medications Prior to Admission  Medication Sig Dispense Refill  . acidophilus (RISAQUAD) CAPS Take 1 capsule by mouth daily.       . Ascorbic Acid (VITAMIN C) 1000 MG tablet Take 1,000 mg by mouth daily.      Marland Kitchen buPROPion (ZYBAN) 150 MG 12 hr tablet Take 150 mg by mouth daily.       Marland Kitchen dipyridamole-aspirin (AGGRENOX) 200-25 MG per 12 hr capsule Take 1 capsule by mouth 2 (two)  times daily.  180 capsule  1  . docusate sodium (COLACE) 100 MG capsule Take 200 mg by mouth 2 (two) times daily.      Marland Kitchen gabapentin (NEURONTIN) 300 MG capsule Take 300 mg by mouth 3 (three) times daily.      Marland Kitchen lisinopril (PRINIVIL,ZESTRIL) 5 MG tablet Take 0.5 tablets (2.5 mg total) by mouth daily.      . metFORMIN (GLUMETZA) 500 MG (MOD) 24 hr tablet Take 500 mg by mouth 2 (two) times daily with a meal.       . mupirocin ointment (BACTROBAN) 2 % 1 Application, nasally, 2 times daily for 5 days prior to surgery  22 g  0  . omega-3 acid ethyl esters (LOVAZA) 1 G capsule Take 2 g by mouth 2  (two) times daily.       Marland Kitchen omeprazole (PRILOSEC) 40 MG capsule Take 40 mg by mouth daily.      . Probiotic Product (ALIGN PO) Take 1 tablet by mouth at bedtime.        Home: Home Living Family/patient expects to be discharged to:: Private residence Living Arrangements: Spouse/significant other Available Help at Discharge: Family Type of Home: House Home Access: Stairs to enter Secretary/administrator of Steps: 10 Entrance Stairs-Rails: Right;Left Home Layout: Two level;Bed/bath upstairs Alternate Level Stairs-Number of Steps: flight Alternate Level Stairs-Rails: Right;Left (chair lift) Home Equipment: Walker - 2 wheels;Cane - single point;Bedside commode;Shower seat;Hand held shower head;Grab bars - tub/shower Additional Comments: Pt has a stair chair lift now for going upstairs.   Functional History: Prior Function Comments: used cane PTA Functional Status:  Mobility: Bed Mobility Bed Mobility: Not assessed Transfers Transfers: Sit to Stand;Stand to Sit Sit to Stand: 3: Mod assist;With upper extremity assist;With armrests;From chair/3-in-1 Stand to Sit: 3: Mod assist;With upper extremity assist;To chair/3-in-1;With armrests Ambulation/Gait Ambulation/Gait Assistance: 4: Min assist;3: Mod assist Ambulation Distance (Feet): 35 Feet Assistive device: Rolling walker Ambulation/Gait Assistance Details: Pt ambulated 5 feet and then stated he was tired.  Then he ambulated 30 feet second attempt.  Pt needed assist to hold his left UE on RW.  Pt with poor quality of movement of left LE with ambulation as well.  Pt unsteady with gait overall needing min to mod assist for stability.  Gait Pattern: Step-to pattern;Decreased step length - left;Decreased weight shift to left;Shuffle;Trunk flexed;Wide base of support Gait velocity: decreased Stairs: No Wheelchair Mobility Wheelchair Mobility: No  ADL: ADL Eating/Feeding: Supervision/safety;Set up Grooming: Minimal assistance Where  Assessed - Grooming: Supported sitting Upper Body Bathing: Moderate assistance Where Assessed - Upper Body Bathing: Supported sitting Lower Body Bathing: +1 Total assistance Where Assessed - Lower Body Bathing: Supported sit to stand Upper Body Dressing: Moderate assistance Where Assessed - Upper Body Dressing: Unsupported sitting Lower Body Dressing: +1 Total assistance Where Assessed - Lower Body Dressing: Supported sit to Pharmacist, hospital:  (not assessed due to pain) Transfers/Ambulation Related to ADLs: pt declined ADL Comments: limited by weakness and pain  Cognition: Cognition Overall Cognitive Status: Within Functional Limits for tasks assessed Orientation Level: Oriented X4 Cognition Arousal/Alertness: Awake/alert Behavior During Therapy: WFL for tasks assessed/performed Overall Cognitive Status: Within Functional Limits for tasks assessed  Blood pressure 124/48, pulse 103, temperature 98.1 F (36.7 C), temperature source Oral, resp. rate 16, height 5\' 9"  (1.753 m), weight 90.3 kg (199 lb 1.2 oz), SpO2 95.00%. Physical Exam  Vitals reviewed. Constitutional: He is oriented to person, place, and time.  HENT:  Head: Normocephalic.  Eyes: EOM are  normal.  Neck: Normal range of motion. Neck supple. No thyromegaly present.  Cardiovascular: Normal rate and regular rhythm.   Pulmonary/Chest: Effort normal and breath sounds normal. No respiratory distress.  Abdominal: Soft. Bowel sounds are normal. He exhibits no distension. There is no tenderness.  Neurological: He is alert and oriented to person, place, and time.  Left hemiparesis, sensory deficits  Skin:  Incision site clean and dry, chronic vascular changes.   Psychiatric: He has a normal mood and affect.    Results for orders placed during the hospital encounter of 01/06/13 (from the past 24 hour(s))  GLUCOSE, CAPILLARY     Status: Abnormal   Collection Time    01/07/13  8:41 AM      Result Value Range    Glucose-Capillary 203 (*) 70 - 99 mg/dL   Comment 1 Documented in Chart     Comment 2 Notify RN    GLUCOSE, CAPILLARY     Status: Abnormal   Collection Time    01/07/13 12:52 PM      Result Value Range   Glucose-Capillary 159 (*) 70 - 99 mg/dL  GLUCOSE, CAPILLARY     Status: Abnormal   Collection Time    01/07/13  4:18 PM      Result Value Range   Glucose-Capillary 123 (*) 70 - 99 mg/dL  GLUCOSE, CAPILLARY     Status: Abnormal   Collection Time    01/07/13  9:07 PM      Result Value Range   Glucose-Capillary 116 (*) 70 - 99 mg/dL  CBC     Status: Abnormal (Preliminary result)   Collection Time    01/08/13  5:10 AM      Result Value Range   WBC 7.9  4.0 - 10.5 K/uL   RBC 2.83 (*) 4.22 - 5.81 MIL/uL   Hemoglobin 7.5 (*) 13.0 - 17.0 g/dL   HCT 14.7 (*) 82.9 - 56.2 %   MCV 82.3  78.0 - 100.0 fL   MCH 26.5  26.0 - 34.0 pg   MCHC 32.2  30.0 - 36.0 g/dL   RDW 13.0 (*) 86.5 - 78.4 %   Platelets PENDING  150 - 400 K/uL  BASIC METABOLIC PANEL     Status: Abnormal   Collection Time    01/08/13  5:10 AM      Result Value Range   Sodium 135  135 - 145 mEq/L   Potassium 3.9  3.5 - 5.1 mEq/L   Chloride 103  96 - 112 mEq/L   CO2 23  19 - 32 mEq/L   Glucose, Bld 176 (*) 70 - 99 mg/dL   BUN 9  6 - 23 mg/dL   Creatinine, Ser 6.96  0.50 - 1.35 mg/dL   Calcium 8.2 (*) 8.4 - 10.5 mg/dL   GFR calc non Af Amer 66 (*) >90 mL/min   GFR calc Af Amer 77 (*) >90 mL/min  GLUCOSE, CAPILLARY     Status: Abnormal   Collection Time    01/08/13  6:09 AM      Result Value Range   Glucose-Capillary 162 (*) 70 - 99 mg/dL   Dg Chest Port 1 View  01/06/2013   *RADIOLOGY REPORT*  Clinical Data: right IJ catheter placement  PORTABLE CHEST - 1 VIEW  Comparison: 05/19/2012  Findings: There is a right chest wall porta-catheter with tip in the projection of the SVC.  Interval placement of a right IJ catheter with tip in the SVC.  No pneumothorax  identified.  Heart size is normal.  The lung volumes are low and  there is atelectasis in the left base.  IMPRESSION:  1.  No pneumothorax after right IJ catheter placement.  Tip of the IJ catheter is in the SVC. 2.  Left base atelectasis.   Original Report Authenticated By: Signa Kell, M.D.   Dg Ang/ext/uni/or Left  01/07/2013   *RADIOLOGY REPORT*  Clinical Data: Left leg arteriogram  LEFT ANG/EXT/UNI/ OR  Technique: Intraoperative single static arteriogram image  Comparison:  12/31/2012  Findings: Femoral to popliteal artery below the knee bypass graft is patent.  Distal anastomosis is patent.  Three-vessel runoff to the mid leg is identified.  IMPRESSION: Patent bypass graft with three-vessel runoff.   Original Report Authenticated By: Jolaine Click, M.D.    Assessment/Plan: Diagnosis: weakness, impaired gait due to severe PVD s/p left fem-op BPG in a man with a prior right CVA/left hemiparesis 1. Does the need for close, 24 hr/day medical supervision in concert with the patient's rehab needs make it unreasonable for this patient to be served in a less intensive setting? Yes 2. Co-Morbidities requiring supervision/potential complications: dm, cancer, pain 3. Due to bladder management, bowel management, safety, skin/wound care, disease management, medication administration, pain management and patient education, does the patient require 24 hr/day rehab nursing? Yes 4. Does the patient require coordinated care of a physician, rehab nurse, PT (1-2 hrs/day, 5 days/week) and OT (1-2 hrs/day, 5 days/week) to address physical and functional deficits in the context of the above medical diagnosis(es)? Yes Addressing deficits in the following areas: balance, endurance, locomotion, strength, transferring, bowel/bladder control, bathing, dressing, feeding, grooming, toileting and psychosocial support 5. Can the patient actively participate in an intensive therapy program of at least 3 hrs of therapy per day at least 5 days per week? Yes 6. The potential for patient to make  measurable gains while on inpatient rehab is excellent 7. Anticipated functional outcomes upon discharge from inpatient rehab are supervision to min assist with PT, supervision to minimal assist with OT, n/a with SLP. 8. Estimated rehab length of stay to reach the above functional goals is: 10 days 9. Does the patient have adequate social supports to accommodate these discharge functional goals? Yes 10. Anticipated D/C setting: Home 11. Anticipated post D/C treatments: HH therapy 12. Overall Rehab/Functional Prognosis: good  RECOMMENDATIONS: This patient's condition is appropriate for continued rehabilitative care in the following setting: CIR Patient has agreed to participate in recommended program. Yes and Potentially Note that insurance prior authorization may be required for reimbursement for recommended care.  Comment: Rehab RN to follow up.   Ranelle Oyster, MD, [facility] Physical Medicine & Rehabilitation     01/08/2013

## 2013-01-08 NOTE — Progress Notes (Addendum)
[facility] Progress Note  01/08/2013 7:27 AM 2 Days Post-Op  Subjective:  C/o soreness-does not want to get out of bed.  Foot feels better  Tm 99.2 now afebrile VSS 95% RA  Filed Vitals:   01/08/13 0406  BP: 124/48  Pulse: 103  Temp: 98.1 F (36.7 C)  Resp: 16    Physical Exam: Incisions:  All incisions are c/d/i with staples in tact; left groin is c/d/i Extremities:  Left foot is warm and well perfused.  CBC    Component Value Date/Time   WBC 7.9 01/08/2013 0510   WBC 6.3 12/01/2012 1008   RBC 2.83* 01/08/2013 0510   RBC 3.87* 12/01/2012 1008   RBC 3.54* 01/26/2012 0455   HGB 7.5* 01/08/2013 0510   HGB 10.0* 12/01/2012 1008   HCT 23.3* 01/08/2013 0510   HCT 31.1* 12/01/2012 1008   PLT PENDING 01/08/2013 0510   PLT 317 12/01/2012 1008   MCV 82.3 01/08/2013 0510   MCV 80.5 12/01/2012 1008   MCH 26.5 01/08/2013 0510   MCH 25.9* 12/01/2012 1008   MCHC 32.2 01/08/2013 0510   MCHC 32.2 12/01/2012 1008   RDW 15.9* 01/08/2013 0510   RDW 16.1* 12/01/2012 1008   LYMPHSABS 1.8 12/01/2012 1008   LYMPHSABS 1.3 02/01/2012 0630   MONOABS 0.7 12/01/2012 1008   MONOABS 1.1* 02/01/2012 0630   EOSABS 0.1 12/01/2012 1008   EOSABS 0.3 02/01/2012 0630   BASOSABS 0.1 12/01/2012 1008   BASOSABS 0.0 02/01/2012 0630    BMET    Component Value Date/Time   NA 135 01/08/2013 0510   NA 138 12/01/2012 1008   NA 147* 10/12/2011 0959   K 3.9 01/08/2013 0510   K 5.0 12/01/2012 1008   K 5.2* 10/12/2011 0959   CL 103 01/08/2013 0510   CL 102 05/19/2012 1427   CL 102 10/12/2011 0959   CO2 23 01/08/2013 0510   CO2 23 12/01/2012 1008   CO2 29 10/12/2011 0959   GLUCOSE 176* 01/08/2013 0510   GLUCOSE 195* 12/01/2012 1008   GLUCOSE 205* 05/19/2012 1427   GLUCOSE 244* 10/12/2011 0959   BUN 9 01/08/2013 0510   BUN 13.9 12/01/2012 1008   BUN 13 10/12/2011 0959   CREATININE 1.11 01/08/2013 0510   CREATININE 1.4* 12/01/2012 1008   CREATININE 1.0 10/12/2011 0959   CALCIUM 8.2* 01/08/2013 0510   CALCIUM 9.9 12/01/2012 1008   CALCIUM 9.0 10/12/2011 0959   GFRNONAA 66* 01/08/2013 0510   GFRAA 77* 01/08/2013 0510    INR    Component Value Date/Time   INR 1.06 01/02/2013 1214     Intake/Output Summary (Last 24 hours) at 01/08/13 0727 Last data filed at 01/08/13 0400  Gross per 24 hour  Intake   1340 ml  Output    680 ml  Net    660 ml     Assessment:  67 y.o. male is s/p:  Left femoral to below knee popliteal artery bypass with non reversed greater saphenous vein, patch angioplasty left common femoral artery, intraop agram  2 Days Post-Op  Plan: -acute surgical blood loss anemia with hgb of 7.5-down from 8.8.  Will transfuse 2 PRBC's -ck CBC in am -DVT prophylaxis:  Pt is not on DVT prophylaxis-will hold off for now as hgb is down -ambulate and mobilize more today.  OOB to chair tid with meals -d/c IVF -BUN/Cr are normal-will start metformin back tomorrow -PT/OT recommending CIR   Doreatha Massed, PA-C [facility] [phone] 01/08/2013  7:27 AM  Agree with above transfuse for acute blood loss anemia Left foot toes 1 2 4  dusky PT DP doppler Check CTA tomorrow to assess inflow Ambulate  Fabienne Bruns, MD [facility] [place] Office: [phone] Pager: [phone]

## 2013-01-08 NOTE — Progress Notes (Signed)
Occupational Therapy Treatment Patient Details Name: ALCARIO TINKEY MRN: [id number] DOB: 14-Feb-1946 Today's Date: 01/08/2013 Time: 4098-1191 OT Time Calculation (min): 12 min  OT Assessment / Plan / Recommendation  History of present illness Pt admit for left fem pop BPG.     OT comments  Pt with increased c/o pain, however, participated with OT/PT. Pt impulsive and is at risk for falls. Discussed with nursing. Rec nsg use RW rather than cane at this time when mobilizing pt for functional transfers. Cont to rec CIR.  Follow Up Recommendations  CIR    Barriers to Discharge       Equipment Recommendations  Other (comment)    Recommendations for Other Services Rehab consult  Frequency Min 2X/week   Progress towards OT Goals Progress towards OT goals: Progressing toward goals  Plan Discharge plan remains appropriate    Precautions / Restrictions Precautions Precautions: Fall Precaution Comments: impulsive Restrictions Weight Bearing Restrictions: No   Pertinent Vitals/Pain 6.L Leg    ADL  ADL Comments: focus of sessionon mobility. Wife assisting with toileting.     OT Diagnosis:    OT Problem List:   OT Treatment Interventions:     OT Goals(current goals can now be found in the care plan section) Acute Rehab OT Goals Patient Stated Goal: to go home OT Goal Formulation: With patient/family Time For Goal Achievement: 01/21/13 Potential to Achieve Goals: Good ADL Goals Pt Will Perform Lower Body Bathing: with caregiver independent in assisting;with min assist;with adaptive equipment Pt Will Perform Lower Body Dressing: with min assist;with caregiver independent in assisting;with adaptive equipment;sit to/from stand Pt Will Transfer to Toilet: with min assist;bedside commode;stand pivot transfer Pt Will Perform Toileting - Clothing Manipulation and hygiene: with supervision;sit to/from stand  Visit Information  Last OT Received On: 01/08/13 Assistance Needed:  +2 PT/OT Co-Evaluation/Treatment: Yes History of Present Illness: Pt admit for left fem pop BPG.      Subjective Data      Prior Functioning       Cognition  Cognition Arousal/Alertness: Awake/alert Behavior During Therapy: WFL for tasks assessed/performed Overall Cognitive Status: Within Functional Limits for tasks assessed (most likely baseline cognitive impairments from CVA)    Mobility  Bed Mobility Bed Mobility: Not assessed Transfers Transfers: Sit to Stand;Stand to Sit Sit to Stand: 3: Mod assist Stand to Sit: 3: Mod assist Details for Transfer Assistance: cues for hand placement    Exercises      Balance Static Standing Balance Static Standing - Balance Support: Bilateral upper extremity supported;During functional activity Static Standing - Level of Assistance: 4: Min assist   End of Session OT - End of Session Equipment Utilized During Treatment: Gait belt;Rolling walker Activity Tolerance: Patient limited by pain Patient left: in chair;with call bell/phone within reach;with nursing/sitter in room;with family/visitor present Nurse Communication: Mobility status  GO     Shelie Lansing,HILLARY 01/08/2013, 10:53 AM Luisa Dago, OTR/L  [phone] 01/08/2013

## 2013-01-08 NOTE — Progress Notes (Signed)
Physical Therapy Treatment Patient Details Name: EZEKEIL BETHEL MRN: [id number] DOB: July 22, 1945 Today's Date: 01/08/2013 Time: 6578-4696 PT Time Calculation (min): 12 min  PT Assessment / Plan / Recommendation  History of Present Illness Pt admit for left fem pop BPG.     PT Comments   Patient complains of increased pain and stiffness in LLE. Patient educated on positioning and activities to prevent knee flexion contracture. Patient did attempt ambulation today but was very limited.  Will continue to work with patient and progress activity as tolerated.   Follow Up Recommendations  Supervision/Assistance - 24 hour;CIR     Does the patient have the potential to tolerate intense rehabilitation     Barriers to Discharge        Equipment Recommendations  None recommended by PT    Recommendations for Other Services Rehab consult  Frequency Min 3X/week   Progress towards PT Goals Progress towards PT goals: Progressing toward goals  Plan Current plan remains appropriate    Precautions / Restrictions Precautions Precautions: Fall Precaution Comments: impulsive   Pertinent Vitals/Pain 6/10 LLE    Mobility  Bed Mobility Bed Mobility: Not assessed Transfers Transfers: Sit to Stand;Stand to Sit Sit to Stand: 3: Mod assist Stand to Sit: 3: Mod assist Details for Transfer Assistance: cues for hand placement Ambulation/Gait Ambulation/Gait Assistance: 4: Min assist;3: Mod assist Ambulation Distance (Feet): 12 Feet Assistive device: Rolling walker Ambulation/Gait Assistance Details: Pt with increased pain and stiffness in LLE, did not wish to continue ambulation Gait Pattern: Step-to pattern;Decreased step length - left;Decreased weight shift to left;Shuffle;Trunk flexed;Wide base of support Gait velocity: decreased    Exercises     PT Diagnosis:    PT Problem List:   PT Treatment Interventions:     PT Goals (current goals can now be found in the care plan section) Acute  Rehab PT Goals Patient Stated Goal: to go home PT Goal Formulation: With patient Time For Goal Achievement: 01/21/13 Potential to Achieve Goals: Good  Visit Information  Last PT Received On: 01/08/13 Assistance Needed: +2 History of Present Illness: Pt admit for left fem pop BPG.      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

## 2013-01-09 ENCOUNTER — Inpatient Hospital Stay (HOSPITAL_COMMUNITY): Payer: Medicare Other

## 2013-01-09 ENCOUNTER — Encounter (HOSPITAL_COMMUNITY): Payer: Self-pay | Admitting: Vascular Surgery

## 2013-01-09 LAB — TYPE AND SCREEN
ABO/RH(D): O POS
ABO/RH(D): O POS
Antibody Screen: NEGATIVE
Unit division: 0
Unit division: 0

## 2013-01-09 LAB — CBC
HCT: 32.9 % — ABNORMAL LOW (ref 39.0–52.0)
MCH: 27.6 pg (ref 26.0–34.0)
MCHC: 33.4 g/dL (ref 30.0–36.0)
MCHC: 33.4 g/dL (ref 30.0–36.0)
MCV: 83.3 fL (ref 78.0–100.0)
MCV: 82.6 fL (ref 78.0–100.0)
Platelets: 197 10*3/uL (ref 150–400)
RBC: 3.91 MIL/uL — ABNORMAL LOW (ref 4.22–5.81)
RDW: 15.8 % — ABNORMAL HIGH (ref 11.5–15.5)
WBC: 10.4 10*3/uL (ref 4.0–10.5)

## 2013-01-09 LAB — GLUCOSE, CAPILLARY
Glucose-Capillary: 156 mg/dL — ABNORMAL HIGH (ref 70–99)
Glucose-Capillary: 137 mg/dL — ABNORMAL HIGH (ref 70–99)

## 2013-01-09 LAB — HEMOGLOBIN AND HEMATOCRIT, BLOOD: HCT: 29.6 % — ABNORMAL LOW (ref 39.0–52.0)

## 2013-01-09 LAB — BASIC METABOLIC PANEL
CO2: 22 mEq/L (ref 19–32)
Calcium: 8.7 mg/dL (ref 8.4–10.5)
Creatinine, Ser: 1.02 mg/dL (ref 0.50–1.35)

## 2013-01-09 MED ORDER — ALTEPLASE 100 MG IV SOLR
2.0000 mg | INTRAVENOUS | Status: AC
  Administered 2013-01-09: 2 mg
  Filled 2013-01-09: qty 2

## 2013-01-09 MED ORDER — CEFAZOLIN SODIUM-DEXTROSE 2-3 GM-% IV SOLR
2.0000 g | Freq: Once | INTRAVENOUS | Status: AC
  Administered 2013-01-10: 2 g via INTRAVENOUS
  Filled 2013-01-09 (×2): qty 50

## 2013-01-09 MED ORDER — CEFAZOLIN SODIUM 1-5 GM-% IV SOLN: 1.0000 g | Freq: Once | INTRAVENOUS | Status: DC

## 2013-01-09 MED ORDER — DIAZEPAM 5 MG PO TABS
5.0000 mg | ORAL_TABLET | ORAL | Status: AC
  Administered 2013-01-09: 5 mg via ORAL
  Filled 2013-01-09: qty 1

## 2013-01-09 MED ORDER — IOHEXOL 350 MG/ML SOLN
100.0000 mL | Freq: Once | INTRAVENOUS | Status: AC | PRN
  Administered 2013-01-09: 100 mL via INTRAVENOUS

## 2013-01-09 NOTE — Progress Notes (Signed)
Rehab admissions - Bed available and can admit to acute inpatient rehab today.  Call me for questions.  #[phone]

## 2013-01-09 NOTE — Progress Notes (Addendum)
[facility] [place]  Progress Note Bypass Surgery  Date of Surgery: 01/06/2013  Procedure(s): Femoral-Popliteal Artery Bypass INTRA OPERATIVE ARTERIOGRAM Surgeon: Surgeon(s): Sherren Kerns, MD Chuck Hint, MD  3 Days Post-Op  History of Present Illness  Donald [name] is a 67 y.o. male who is  3 Days Post-Op. The patient's pre-op symptoms of pain are Improved . Patients pain is well controlled.    VASC. LAB Studies:        ABI: Right 0.72;  Left 0.83;   Imaging: No results found.  Significant Diagnostic Studies: CBC Lab Results  Component Value Date   WBC 10.4 01/09/2013   HGB 11.0* 01/09/2013   HCT 32.9* 01/09/2013   MCV 83.3 01/09/2013   PLT 197 01/09/2013    BMET    Component Value Date/Time   NA 135 01/08/2013 0510   NA 138 12/01/2012 1008   NA 147* 10/12/2011 0959   K 3.9 01/08/2013 0510   K 5.0 12/01/2012 1008   K 5.2* 10/12/2011 0959   CL 103 01/08/2013 0510   CL 102 05/19/2012 1427   CL 102 10/12/2011 0959   CO2 23 01/08/2013 0510   CO2 23 12/01/2012 1008   CO2 29 10/12/2011 0959   GLUCOSE 176* 01/08/2013 0510   GLUCOSE 195* 12/01/2012 1008   GLUCOSE 205* 05/19/2012 1427   GLUCOSE 244* 10/12/2011 0959   BUN 9 01/08/2013 0510   BUN 13.9 12/01/2012 1008   BUN 13 10/12/2011 0959   CREATININE 1.11 01/08/2013 0510   CREATININE 1.4* 12/01/2012 1008   CREATININE 1.0 10/12/2011 0959   CALCIUM 8.2* 01/08/2013 0510   CALCIUM 9.9 12/01/2012 1008   CALCIUM 9.0 10/12/2011 0959   GFRNONAA 66* 01/08/2013 0510   GFRAA 77* 01/08/2013 0510    COAG Lab Results  Component Value Date   INR 1.06 01/02/2013   INR 1.05 01/24/2012   INR 0.98 01/23/2012   No results found for this basename: PTT    Physical Examination  BP Readings from Last 3 Encounters:  01/09/13 131/59  01/09/13 131/59  01/05/13 142/59   Temp Readings from Last 3 Encounters:  01/09/13 98 F (36.7 C) Oral  01/09/13 98 F (36.7 C) Oral  01/02/13 98.3 F (36.8 C)    SpO2 Readings  from Last 3 Encounters:  01/09/13 98%  01/09/13 98%  01/02/13 94%   Pulse Readings from Last 3 Encounters:  01/09/13 89  01/09/13 89  01/05/13 92    Pt is A&O x 3 left lower extremity: Incision/s is/are clean,dry.intact, and  healing without hematoma, erythema or drainage Limb is warm; with good color Palpable PT LLE  Assessment/Plan: Pt. Doing well Post-op pain is controlled Wounds are healing well PT/OT for ambulation Ordered CTA for today Hemoglobin improved 11.0 Clear liquids until after CTA Encourage ambulation today Pending CIR evaluation  Thomasena Edis, EMMA [facility]  01/09/2013 7:25 AM   Agree with above.  PT doppler signal.  Awaiting CT angio to address inflow but should be ready for Rehab if candidate.  Fabienne Bruns, MD [facility] [place] Office: [phone] Pager: [phone]

## 2013-01-09 NOTE — Discharge Summary (Deleted)
[facility] Discharge Summary   Patient ID:  Donald [name] MRN: [id number] DOB/AGE: 05-26-1945 67 y.o.  Admit date: 01/06/2013 Discharge date: 01/09/2013 Date of Surgery: 01/06/2013 Surgeon: Surgeon(s): Sherren Kerns, MD Chuck Hint, MD  Admission Diagnosis: Claudication  Discharge Diagnoses:  Claudication  Secondary Diagnoses: Past Medical History  Diagnosis Date  . GERD (gastroesophageal reflux disease)   . Hyperlipidemia   . Hypertension   . Peripheral vascular disease   . Type II diabetes mellitus   . Pneumonia   . Internal and external bleeding hemorrhoids   . Stroke 2008    "left side weak; unable to move left hand still" (01/23/2012)  . History of gout     left great toe  . anal ca dx'd 08/2007    S/P chemo, radiation, biopsies  . 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 Depression   . Numbness in right leg     Hx: of diabetes  . Coronary artery disease     RCA occlusion with good collaterals, o/w no obs CAD 12/2011    Procedure(s): 01/06/13  Discharged Condition: good  HPI: Donald [name] is a 67 y.o. male who underwent axillary bifemoral bypass 01/29/2012. His wife report a new blister on the tip of his left great toe due to new shoe ware that is healing fine and a superficial scrape on the lateral malleolus. He returns today for followup after recent CT angiogram with runoff. He thinks that the toes have gotten worse. He still has pain in the left foot. He is fairly debilitated overall from prior stroke. He is able to transfer but is not able to ambulate much due to left-sided weakness. He now has a limb threatening situation with ulcerations and blistering of his left first and second toe with decreased ABIs. He has evidence of common femoral artery occlusive disease below the anastomosis. He also has a proximal SFA occlusion. He also has popliteal artery occlusive disease. Apparently there is three-vessel runoff to the right  foot. I believe the best option would be a left femoral to below-knee popliteal bypass with vein if this is available conduit. If not we will use prosthetic. We'll assess the adequacy of his inflow on the table. If the inflow was found to be poor then we would possibly also revise the proximal anastomosis as well. Most likely I will only address the outflow procedure in this setting. We would consider an arteriogram to evaluate the inflow later on if it is not adequate.    Hospital Course:  Donald [name] is a 67 y.o. male is S/P  Left femoral to below knee popliteal artery bypass with non reversed greater saphenous vein, patch angioplasty left common femoral artery, intraop agram   Extubated: POD # 0 Physical exam:Pt is A&O x 3  left lower extremity: Incision/s is/are clean,dry.intact, and healing  without hematoma, erythema or drainage  Limb is warm; with good color  Palpable PT LLE Left great toe ulcer stable Post-op wounds healing well Pt. Ambulating limited, voiding and taking PO diet without difficulty. Pt pain controlled with PO pain meds. Labs as below Complications:none  Consults:     Significant Diagnostic Studies: CBC Lab Results  Component Value Date   WBC 10.4 01/09/2013   HGB 11.0* 01/09/2013   HCT 32.9* 01/09/2013   MCV 83.3 01/09/2013   PLT 197 01/09/2013    BMET    Component Value Date/Time   NA 135 01/08/2013 0510   NA  138 12/01/2012 1008   NA 147* 10/12/2011 0959   K 3.9 01/08/2013 0510   K 5.0 12/01/2012 1008   K 5.2* 10/12/2011 0959   CL 103 01/08/2013 0510   CL 102 05/19/2012 1427   CL 102 10/12/2011 0959   CO2 23 01/08/2013 0510   CO2 23 12/01/2012 1008   CO2 29 10/12/2011 0959   GLUCOSE 176* 01/08/2013 0510   GLUCOSE 195* 12/01/2012 1008   GLUCOSE 205* 05/19/2012 1427   GLUCOSE 244* 10/12/2011 0959   BUN 9 01/08/2013 0510   BUN 13.9 12/01/2012 1008   BUN 13 10/12/2011 0959   CREATININE 1.11 01/08/2013 0510   CREATININE 1.4* 12/01/2012 1008   CREATININE 1.0  10/12/2011 0959   CALCIUM 8.2* 01/08/2013 0510   CALCIUM 9.9 12/01/2012 1008   CALCIUM 9.0 10/12/2011 0959   GFRNONAA 66* 01/08/2013 0510   GFRAA 77* 01/08/2013 0510   COAG Lab Results  Component Value Date   INR 1.06 01/02/2013   INR 1.05 01/24/2012   INR 0.98 01/23/2012     Disposition:  Discharge to :Rehab - CIR  Future Appointments Provider Department Dept Phone   01/12/2013 1:30 PM [facility] [phone]   01/22/2013 10:00 AM [facility] [phone]   02/05/2013 10:45 AM Sherren Kerns, MD [facility] [phone]   03/09/2013 1:30 PM [facility] Nurse [facility] [phone]   05/04/2013 1:30 PM [facility] Nurse [place] [phone]   05/15/2013 2:30 PM Nilda Riggs, NP [facility] [phone]   06/05/2013 10:30 AM [facility] [phone]   06/05/2013 11:00 AM Wl-Dg 4 (Chest) [facility] [phone]   06/05/2013 11:30 AM Wl-Ct 2 [facility] [phone]   Patient to arrive 15 minutes prior to appointment time. Patient to pick up oral contrast at least 1 day prior to exam, unless otherwise instructed by your physician. No solid food 4 hours prior to exam. Liquids and Medicines are okay.   06/09/2013 3:00 PM Benjiman Core, MD [facility] MEDICAL ONCOLOGY [phone]   06/29/2013 1:30 PM [facility] Nurse [place] [phone]       Medication List    ASK your doctor about these medications       acidophilus Caps capsule  Take 1 capsule by mouth daily.     ALIGN PO  Take 1 tablet by mouth at bedtime.     buPROPion 150 MG 12 hr tablet  Commonly known as:  ZYBAN  Take 150 mg by mouth  daily.     dipyridamole-aspirin 200-25 MG per 12 hr capsule  Commonly known as:  AGGRENOX  Take 1 capsule by mouth 2 (two) times daily.     docusate sodium 100 MG capsule  Commonly known as:  COLACE  Take 200 mg by mouth 2 (two) times daily.     gabapentin 300 MG capsule  Commonly known as:  NEURONTIN  Take 300 mg by mouth 3 (three) times daily.     lisinopril 5 MG tablet  Commonly known as:  PRINIVIL,ZESTRIL  Take 0.5 tablets (2.5 mg total) by mouth daily.     metFORMIN 500 MG (MOD) 24 hr tablet  Commonly known as:  GLUMETZA  Take 500 mg by mouth 2 (two) times daily with a meal.  mupirocin ointment 2 %  Commonly known as:  BACTROBAN  1 Application, nasally, 2 times daily for 5 days prior to surgery     omega-3 acid ethyl esters 1 G capsule  Commonly known as:  LOVAZA  Take 2 g by mouth 2 (two) times daily.     omeprazole 40 MG capsule  Commonly known as:  PRILOSEC  Take 40 mg by mouth daily.     vitamin C 1000 MG tablet  Take 1,000 mg by mouth daily.       Verbal and written Discharge instructions given to the patient. Wound care per Discharge AVS     Follow-up Information   Follow up with Sherren Kerns, MD In 4 weeks. (office will arrange sent)    Specialty:  Vascular Surgery   Contact information:   [address] [place] [address] [phone]       Follow up with [facility] Nurse In 2 weeks. (staple removal)       Signed: Korin Hartwell J 01/09/2013, 1:21 PM

## 2013-01-09 NOTE — PMR Pre-admission (Addendum)
PMR Admission Coordinator Pre-Admission Assessment  Patient: Donald [name] is an 67 y.o., male MRN: [id number] DOB: May 27, 1945 Height: 5\' 9"  (175.3 cm) Weight: 90.3 kg (199 lb 1.2 oz)              Insurance Information HMO:      PPO:       PCP:       IPA:       80/20:       OTHER:   PRIMARY: Medicare A/B      Policy#: [id number]      Subscriber: Beverly Sessions CM Name:        Phone#:       Fax#:   Pre-Cert#:        Employer:  Retired Benefits:  Phone #:       Name: Armed forces technical officer. Date: 01/07/09     Deduct: $1216      Out of Pocket Max: none      Life Max: unlimited CIR: 100%      SNF: 100 days Outpatient: 80%     Co-Pay: 20% Home Health: 100%      Co-Pay: none DME: 80%     Co-Pay: 20% Providers: patient's choice  SECONDARY: [employer]      Policy#: [id number]      Subscriber: Beverly Sessions CM Name:        Phone#:       Fax#:   Pre-Cert#:        Employer:  Retired Benefits:  Phone #: [phone]     Name:   Eff. Date:       Deduct:        Out of Pocket Max:        Life Max:   CIR:        SNF:   Outpatient:       Co-Pay:   Home Health:        Co-Pay:   DME:       Co-Pay:    Emergency Contact Information Contact Information   Name Relation Home Work Mobile   Arviso,Judy Spouse [phone]       Current Medical History  Patient Admitting Diagnosis:  Weakness, impaired gait due to severe PVD s/p left fem-op BPG in a man with a prior right CVA/left hemiparesis    History of Present Illness:  A 67 y.o. right-handed male well known to rehabilitation services from admission October 2013 for deconditioning related to multiple medical issues, peripheral vascular disease status post axillary bifemoral bypass 01/29/2012 as well as CVA with spastic left hemiparesis and maintained on Aggrenox therapy and tobacco abuse. Admitted 01/06/2013 with a new blister tip of his left great toe and increased rest pain. CT angioplasty lower extremity show critical stenosis left common femoral  artery just distal to the femoral bypass graft. Underwent a femoral to below knee popliteal artery bypass with patch angioplasty left common femoral artery 01/06/2013 per Dr. Darrick Penna. A intraoperative single static arteriogram image showed patent bypass graft with three-vessel runoff. Acute blood loss anemia 8.0 and monitored. Followup ABI studies indicate a moderate reduction in arterial flow on the right and a mild reduction on the left. CT angiography of chest, abdomen and pelvis with iliofemoral runoff 01/10/2013 to evaluate axillary and subclavian arteries showed occluded left femoral-popliteal saphenous vein graft and underwent redo left femoral-popliteal bypass graft 01/10/2013 per Dr. Hart Rochester. Placed on subcutaneous Lovenox for DVT prophylaxis 01/11/2013. Patient remains on Aggrenox for history of CVA. Physical occupational therapy evaluations completed  an ongoing was noted deconditioning. M.D. as requested physical medicine rehabilitation consult to consider inpatient rehabilitation services. Patient was felt to be a good candidate for inpatient rehabilitation services and will be admitted for a comprehensive rehabilitation program.   Past Medical History  Past Medical History  Diagnosis Date  . GERD (gastroesophageal reflux disease)   . Hyperlipidemia   . Hypertension   . Peripheral vascular disease   . Type II diabetes mellitus   . Pneumonia   . Internal and external bleeding hemorrhoids   . Stroke 2008    "left side weak; unable to move left hand still" (01/23/2012)  . History of gout     left great toe  . anal ca dx'd 08/2007    S/P chemo, radiation, biopsies  . 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 Depression   . Numbness in right leg     Hx: of diabetes  . Coronary artery disease     RCA occlusion with good collaterals, o/w no obs CAD 12/2011    Family History  family history includes Cancer - Other in his sister.  Prior Rehab/Hospitalizations:  CIR previously after bypass  surgery about 1 yr ago.   Current Medications  Current facility-administered medications:0.9 %  sodium chloride infusion, , Intravenous, Continuous, Lars Mage, PA-C, Last Rate: 100 mL/hr at 01/10/13 1347;  acetaminophen (TYLENOL) suppository 325-650 mg, 325-650 mg, Rectal, Q4H PRN, Mcarthur Rossetti Angiulli, PA-C;  acetaminophen (TYLENOL) tablet 325-650 mg, 325-650 mg, Oral, Q4H PRN, Mcarthur Rossetti Angiulli, PA-C acidophilus (RISAQUAD) capsule 1 capsule, 1 capsule, Oral, Daily, Mcarthur Rossetti Angiulli, PA-C, 1 capsule at 01/12/13 1046;  bisacodyl (DULCOLAX) suppository 10 mg, 10 mg, Rectal, Daily PRN, Mcarthur Rossetti Angiulli, PA-C;  buPROPion (ZYBAN) 12 hr tablet 150 mg, 150 mg, Oral, Daily, Mcarthur Rossetti Angiulli, PA-C, 150 mg at 01/12/13 1051 dipyridamole-aspirin (AGGRENOX) 200-25 MG per 12 hr capsule 1 capsule, 1 capsule, Oral, BID, Mcarthur Rossetti Angiulli, PA-C, 1 capsule at 01/12/13 2112;  DOPamine (INTROPIN) 800 mg in dextrose 5 % 250 mL infusion, 3-5 mcg/kg/min, Intravenous, Continuous, Emma M Collins, PA-C;  enoxaparin (LOVENOX) injection 30 mg, 30 mg, Subcutaneous, Q24H, Emma M Collins, PA-C, 30 mg at 01/13/13 1610 gabapentin (NEURONTIN) capsule 300 mg, 300 mg, Oral, TID, Mcarthur Rossetti Angiulli, PA-C, 300 mg at 01/12/13 2113;  guaiFENesin-dextromethorphan (ROBITUSSIN DM) 100-10 MG/5ML syrup 15 mL, 15 mL, Oral, Q4H PRN, Mcarthur Rossetti Angiulli, PA-C;  hydrALAZINE (APRESOLINE) injection 10 mg, 10 mg, Intravenous, Q2H PRN, Regina J Roczniak, PA-C insulin aspart (novoLOG) injection 0-15 Units, 0-15 Units, Subcutaneous, TID WC, Mcarthur Rossetti Angiulli, PA-C, 3 Units at 01/13/13 [phone];  labetalol (NORMODYNE,TRANDATE) injection 10 mg, 10 mg, Intravenous, Q2H PRN, Regina J Roczniak, PA-C;  lisinopril (PRINIVIL,ZESTRIL) tablet 2.5 mg, 2.5 mg, Oral, Daily, Pryor Ochoa, MD, 2.5 mg at 01/12/13 1051 metFORMIN (GLUCOPHAGE-XR) 24 hr tablet 500 mg, 500 mg, Oral, BID WC, Daniel J Angiulli, PA-C, 500 mg at 01/13/13 5409;  metoprolol (LOPRESSOR) injection 2-5 mg,  2-5 mg, Intravenous, Q2H PRN, Regina J Roczniak, PA-C;  morphine 2 MG/ML injection 2-5 mg, 2-5 mg, Intravenous, Q1H PRN, Amelia Jo Roczniak, PA-C, 4 mg at 01/11/13 0015;  omega-3 acid ethyl esters (LOVAZA) capsule 2 g, 2 g, Oral, BID, Mcarthur Rossetti Angiulli, PA-C, 2 g at 01/12/13 2112 ondansetron (ZOFRAN) injection 4 mg, 4 mg, Intravenous, Q6H PRN, Amelia Jo Roczniak, PA-C;  ondansetron [facility]) injection 4 mg, 4 mg, Intravenous, Q6H PRN, Mcarthur Rossetti Angiulli, PA-C, 4 mg at 01/11/13 0322;  ondansetron [facility])  tablet 4 mg, 4 mg, Oral, Q6H PRN, Mcarthur Rossetti Angiulli, PA-C;  oxyCODONE-acetaminophen (PERCOCET/ROXICET) 5-325 MG per tablet 1-2 tablet, 1-2 tablet, Oral, Q4H PRN, Mcarthur Rossetti Angiulli, PA-C, 2 tablet at 01/12/13 2112 pantoprazole (PROTONIX) EC tablet 40 mg, 40 mg, Oral, Daily, Mcarthur Rossetti Angiulli, PA-C, 40 mg at 01/12/13 1051;  phenol (CHLORASEPTIC) mouth spray 1 spray, 1 spray, Mouth/Throat, PRN, Amelia Jo Roczniak, PA-C, 1 spray at 01/07/13 2155;  potassium chloride SA (K-DUR,KLOR-CON) CR tablet 20-40 mEq, 20-40 mEq, Oral, Daily PRN, Amelia Jo Roczniak, PA-C senna-docusate (Senokot-S) tablet 2 tablet, 2 tablet, Oral, QHS, Amelia Jo [place], PA-C, 2 tablet at 01/12/13 2112;  sodium chloride 0.9 % injection 10-40 mL, 10-40 mL, Intracatheter, PRN, Sherren Kerns, MD, 10 mL at 01/13/13 0533;  sorbitol 70 % solution 30 mL, 30 mL, Oral, Daily PRN, Mcarthur Rossetti Angiulli, PA-C  Patients Current Diet: Carb Control  Precautions / Restrictions Precautions Precautions: Fall Precaution Comments: impulsive Restrictions Weight Bearing Restrictions: No   Prior Activity Level Community (5-7x/wk): Went out daily.  Home Assistive Devices / Equipment Home Assistive Devices/Equipment: Cane (specify quad or straight);Eyeglasses;Dentures (specify type);Grab bars in shower;Hand-held shower hose;Shower chair with back Home Equipment: Walker - 2 wheels;Cane - single point;Bedside commode;Shower seat;Hand held shower head;Grab bars -  tub/shower  Prior Functional Level Prior Function Level of Independence: Independent with assistive device(s) Comments: used cane PTA  Current Functional Level Cognition  Overall Cognitive Status: Within Functional Limits for tasks assessed Orientation Level: Oriented X4    Extremity Assessment (includes Sensation/Coordination)          ADLs  Eating/Feeding: Supervision/safety;Set up Grooming: Minimal assistance Where Assessed - Grooming: Supported sitting Upper Body Bathing: Moderate assistance Where Assessed - Upper Body Bathing: Supported sitting Lower Body Bathing: +1 Total assistance Where Assessed - Lower Body Bathing: Supported sit to stand Upper Body Dressing: Moderate assistance Where Assessed - Upper Body Dressing: Unsupported sitting Lower Body Dressing: Maximal assistance (due to pain in LLE even when attempting RLE) Where Assessed - Lower Body Dressing: Unsupported sitting Toilet Transfer: Minimal assistance Toilet Transfer Method: Sit to stand Toilet Transfer Equipment:  (Recliner> out door and down hallway>recliner behind him) Equipment Used: Gait belt;Rolling walker (LUE walker splint) Transfers/Ambulation Related to ADLs: min A sit<>stand; total A +2 (pt=80%) ambulation ADL Comments: focus of [place] mobility. Wife assisting with toileting.     Mobility  Bed Mobility: Not assessed    Transfers  Transfers: Sit to Stand;Stand to Sit Sit to Stand: 4: Min assist;With upper extremity assist;With armrests;From chair/3-in-1 Stand to Sit: 4: Min assist;With upper extremity assist;With armrests;To chair/3-in-1    Ambulation / Gait / Stairs / Psychologist, prison and probation services  Ambulation/Gait Ambulation/Gait Assistance: 1: +2 Total assist Ambulation/Gait: Patient Percentage: 80% Ambulation Distance (Feet): 50 Feet Assistive device: Rolling walker Ambulation/Gait Assistance Details: Pt ambulated with RW with left UE handpiece on.  Needed +2 assist for chair follow as well  as needed cues and assist to sequence steps and RW as well as to hold left UE onto hand grip.  Gait deviations apparent however pt states his gait is close to baseline.  Pt unsteady with continued poor quality of movement of left LE with ambulation.   Gait Pattern: Step-to pattern;Decreased step length - left;Decreased weight shift to left;Shuffle;Trunk flexed;Wide base of support Gait velocity: decreased Stairs: No Wheelchair Mobility Wheelchair Mobility: No    Posture / Balance Static Standing Balance Static Standing - Balance Support: Bilateral upper extremity supported;During functional activity Static Standing - Level of Assistance:  4: Min assist Static Standing - Comment/# of Minutes: 2    Special needs/care consideration BiPAP/CPAP No CPM No Continuous Drip IV No Dialysis No        Life Vest No Oxygen No Special Bed No Trach Size No Wound Vac (area) No    Skin Left leg staples and groin staple                              Bowel mgmt: No BM since admission Bladder mgmt: Voiding in urinal Diabetic mgmt Yes, on oral medications.  Wife checks has blood sugars and blood pressure daily    Previous Home Environment Living Arrangements: Spouse/significant other Available Help at Discharge: Family Type of Home: House Home Layout: Two level;Bed/bath upstairs Alternate Level Stairs-Rails: Right;Left (chair lift) Alternate Level Stairs-Number of Steps: flight Home Access: Stairs to enter Entrance Stairs-Rails: Doctor, general practice of Steps: 10 Bathroom Shower/Tub: Health visitor: Standard Bathroom Accessibility: Yes How Accessible: Accessible via walker Home Care Services: No Additional Comments: Pt has a stair chair lift now for going upstairs.   Discharge Living Setting Plans for Discharge Living Setting: Patient's home;House;Lives with (comment) (Lives with wife.  Sister currently staying with them.) Type of Home at Discharge: House Discharge  Home Layout: Two level;Bed/bath upstairs (Has a lift chair to upstairs with 2 steps to lift.) Alternate Level Stairs-Number of Steps: Has a lift chair to upstairs Discharge Home Access: Stairs to enter Entrance Stairs-Number of Steps: 1 step at back door and 10 steps at front entrance.  Will use back entrance. Does the patient have any problems obtaining your medications?: No  Social/Family/Support Systems Patient Roles: Spouse Contact Information: Calin Fantroy - wife Anticipated Caregiver: wife Anticipated Caregiver's Contact Information: Darel Hong [phone] Ability/Limitations of Caregiver: Wife is retired and can assist.  Sister is currently staying with them. Caregiver Availability: 24/7 Discharge Plan Discussed with Primary Caregiver: Yes Is Caregiver In Agreement with Plan?: Yes Does Caregiver/Family have Issues with Lodging/Transportation while Pt is in Rehab?: No  Goals/Additional Needs Patient/Family Goal for Rehab: PT/OT S/min A, no ST needs Expected length of stay: 10 days Cultural Considerations: None Dietary Needs: Diabetic Equipment Needs: TBD Special Service Needs: Patient does not want male assist in the shower.  He wants male assistance.  Male assistance was embarrassing to him during last rehab stay. Pt/Family Agrees to Admission and willing to participate: Yes Program Orientation Provided & Reviewed with Pt/Caregiver Including Roles  & Responsibilities: Yes   Decrease burden of Care through IP rehab admission: N/A  Possible need for SNF placement upon discharge: Not likely   Patient Condition: This patient's condition remains as documented in the consult dated 01/08/13, in which the Rehabilitation Physician determined and documented that the patient's condition is appropriate for intensive rehabilitative care in an inpatient rehabilitation facility. Will admit to inpatient rehab today.  Preadmission Screen Completed By:  Trish Mage, 01/13/2013 11:23  AM ______________________________________________________________________   Discussed status with Dr. Riley Kill on 01/13/13 at 1124 and received telephone approval for admission today.  Admission Coordinator:  Trish Mage, time 1124/Date09/16/14

## 2013-01-09 NOTE — Care Management Note (Signed)
    Page 1 of 1   01/13/2013     2:43:41 PM   CARE MANAGEMENT NOTE 01/13/2013  Patient:  Donald [name], Donald [name]   Account Number:  [id number]  Date Initiated:  01/07/2013  Documentation initiated by:  Donn Pierini  Subjective/Objective Assessment:   Pt admitted s/p fem-pop bypass graft     Action/Plan:   PTA pt lived at home with spouse- PT/OT evals pending   Anticipated DC Date:  01/13/2013   Anticipated DC Plan:  IP REHAB FACILITY      DC Planning Services  CM consult      Choice offered to / List presented to:             Status of service:  Completed, signed off Medicare Important Message given?   (If response is "NO", the following Medicare IM given date fields will be blank) Date Medicare IM given:   Date Additional Medicare IM given:    Discharge Disposition:  IP REHAB FACILITY  Per UR Regulation:  Reviewed for med. necessity/level of care/duration of stay  If discussed at Long Length of Stay Meetings, dates discussed:   01/13/2013    Comments:  01/13/13 Donald Seehafer,RN,BSN [phone] BED AVAILABLE ON IP REHAB UNIT TODAY, AND PT STABLE FOR TRANSFER, PER MD.  WILL DC TO CIR.  01/12/13- 1215- Donn Pierini RN, BSN [phone] Pt with  Thrombosis of left femoral-popliteal saphenous vein graft placed- now s/p failed thrombectomy and insertion of new left common femoral to popliteal (below knee) bypass on 01/10/13---- plan to tx back to 2000 today- and possible CIR in am if all looks good.  01/09/13 Donald Gilkison,RN,BSN [phone] PT ACCEPTED FOR [facility] IP REHAB; PLAN DC TO CIR LATER TODAY.

## 2013-01-09 NOTE — Progress Notes (Signed)
Accessed Rt power PAC accessed x 2 for no blood return.  Flushes well and feels to be in [facility].  CT unable to use with no blood return.  Floor RN to notify MD.

## 2013-01-09 NOTE — Progress Notes (Signed)
Rehab admissions - Evaluated for possible admission.  I spoke with patient and he would like to come to inpatient rehab.  He has requested male assistance with bathing/showering.  Noted CT angio chest pending today.  I spoke with PA who says patient likely ready for inpatient rehab today. I will check rehab bed availability for today.   Call me for questions.  #[phone]

## 2013-01-09 NOTE — Progress Notes (Signed)
Patient ID: Oluwafemi R Junkin, male   DOB: 04/04/1946, 67 y.o.   MRN: [id number] Vascular Surgery Progress Note  Subjective: Occlusion of left femoral-popliteal vein graft. Patient had CT scan ordered to look at left subclavian artery stenosis proximal to left axillobifemoral graft. 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 Also noted was thrombosis of left femoral-popliteal vein graft. Do not know how long this has been thrombosed.  Objective:  Filed Vitals:   01/09/13 2059  BP: 146/57  Pulse: 84  Temp: 98.8 F (37.1 C)  Resp: 18    General alert and oriented x3 no apparent stress 2-3+ pulse in axillobifemoral graft No popliteal pulse palpable left foot is adequately perfused with intact sensation and motion   Labs:  Recent Labs Lab 01/07/13 0405 01/08/13 0510  CREATININE 1.12 1.11    Recent Labs Lab 01/06/13 1619 01/07/13 0405 01/08/13 0510  NA 139 137 135  K 4.7 4.5 3.9  CL  --  105 103  CO2  --  24 23  BUN  --  12 9  CREATININE  --  1.12 1.11  GLUCOSE 174* 159* 176*  CALCIUM  --  8.0* 8.2*    Recent Labs Lab 01/08/13 0510 01/08/13 2330 01/09/13 0510 01/09/13 2028  WBC 7.9  --  10.4 9.2  HGB 7.5* 9.9* 11.0* 10.8*  HCT 23.3* 29.6* 32.9* 32.3*  PLT 84*  --  197 197   No results found for this basename: INR,  in the last 168 hours  I/O last 3 completed shifts: In: 1765 [P.O.:1440; Blood:325] Out: 3400 [Urine:3400]  Imaging: Ct Angio Ao+bifem W/cm &/or Wo/cm  01/09/2013   *RADIOLOGY REPORT*  Clinical Data:  Evaluate axillary and subclavian arteries for planned Ax - bifem bypass  CT ANGIOGRAPHY OF CHEST, ABDOMEN AND PELVIS WITH ILIOFEMORAL RUNOFF  Technique:  Multidetector CT imaging of the chest abdomen, pelvis and lower extremities was performed using the standard protocol during bolus administration of intravenous contrast.  Multiplanar CT image reconstructions including MIPs were obtained to evaluate the vascular anatomy.  Contrast: 100mL  OMNIPAQUE IOHEXOL 350 MG/ML SOLN  *RADIOLOGY REPORT*  Comparison:  Recent CTA runoff 12/31/2012  CTA CHEST  Findings:  Mediastinum: Unremarkable CT appearance of the thyroid gland.  No suspicious mediastinal or hilar adenopathy.  No soft tissue mediastinal mass. Small hiatal hernia.  Heart/Vascular: Conventional three-vessel arch anatomy.  There is moderate (60%) narrowing at the origin of the left subclavian artery secondary to noncalcified fibrofatty plaque.  The origin of the left axillary bifemoral bypass graft is widely patent.  The visualized left axillary artery is unremarkable.  Scattered calcified and noncalcified atherosclerotic plaque throughout the thoracic aorta is somewhat irregular.  No dissection or aneurysmal dilatation.  Right subclavian approach portacatheter with the tip terminating in the distal SVC.  There is also a right IJ central venous catheter with the tip terminating in the distal SVC.  Normal- caliber pulmonary artery.  No pulmonary embolus identified.  Are within normal limits for size.  No pericardial effusion. Atherosclerotic calcifications identified in the coronary arteries.  Lungs/Pleura: Mild centrilobular emphysema.  Diffuse bronchial wall thickening suggestive of chronic bronchitis.  Mild dependent atelectasis in the lower lobes.  Small focus of nonspecific ground- glass attenuation opacity in the lingula is new and the recent interval since the prior study.  This likely represents a small focus of atelectasis.  Bones: No acute fracture or aggressive appearing lytic or blastic osseous lesion.  Review of the MIP images confirms the above   findings.  IMPRESSION:  1.  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  2.  The proximal anastomosis of the left axillary bifemoral bypass graft is widely patent.  3.  Atherosclerosis including coronary artery disease.  4.  Mild centrilobular  pulmonary emphysema.  5.  Additional ancillary findings as above.  CTA ABD/PELVIS + RUNOFF  Findings:  VASCULAR  Aorta: Irregular heterogeneous atherosclerotic plaque throughout the abdominal aorta.  There is significant narrowing in the infrarenal aorta which is mildly ectatic but not aneurysmally dilated.  No dissection or penetrating aortic ulcer.  No significant interval change compared to the recent prior study.  Celiac: Widely patent.  Conventional hepatic arterial anatomy.  SMA: Widely patent.  Renals: Single renal arteries bilaterally are patent.  There is mild atherosclerotic plaque at the origin of the left artery with minimal narrowing.  IMA: Occluded at the origin.  The vessel opacifies via retrograde flow.  Right Lower Extremity:  Heterogeneous irregular calcified and noncalcified atherosclerotic plaque throughout the right common iliac artery.  There is high-grade stenosis at the origin of the right hypogastric artery.  The right external iliac artery is diffusely diseased and nearly occluded at the inguinal ligament. Left axillary to femoral and left right fem-fem bypass graft is patent.  The right bypass graft to common femoral artery anastomosis is patent.  Distal to this, the common femoral artery demonstrates a focal moderate to high-grade narrowing without significant interval change compared to the relatively recent prior.  The right superficial femoral femoral artery is completely occluded.  Profunda remains patent.  The femoral artery reconstitutes in the abductor canal via geniculate collaterals. The popliteal artery is patent.  The runoff vessels are relatively atretic.  There is three-vessel runoff to the ankle.  Left Lower Extremity:  Chronic occlusion of the left common, internal and external iliac arteries.  Patent left axillary to femoral bypass graft.  There is a linear filling defect at the origin of the right to left fem-fem bypass graft which may represent a small amount of  nonocclusive thrombus.  New fluid and subcutaneous emphysema surrounding the distal aspect of the axis bypass graft and left to right fem-fem bypass graft anastomosis consistent with recent incision.  Interval surgical changes of left common femoral patchy angioplasty there is some irregular nonocclusive thrombus along the posterior wall of the patch angioplasty site proximal to the origin of the femoral to below the knee popliteal bypass.  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  The left femoral to below the knee popliteal artery bypass graft is patent for the first centimeter and then occludes.  The vessel does not opacify throughout the entirety of its course. Profunda femoral artery remains patent.  The popliteal artery is patent via retrograde flow from profunda branches.  The runoff vessels are atretic.  There is three-vessel runoff to the ankle.  Veins: No acute venous abnormality.  Review of the MIP images confirms the above findings.  NON-VASCULAR  No significant interval change in the abdominal or pelvic finding compared to the recent prior CT scan dated 12/31/2012.  IMPRESSION:  1.  Interval surgical changes of patchy angioplasty of the left common femoral artery and left femoral to below the knee popliteal artery saphenous vein bypass graft. There is irregular nonocclusive thrombus along the posterior wall of the common femoral artery in the region of the patchy angioplasty and the saphenous vein bypass graft is completely occluded approximately 1   cm beyond the anastomosis.  The popliteal artery reconstitutes via profunda collaterals.  There is three-vessel runoff to the ankle.  2.  Small amount of linear nonocclusive thrombus at the origin of the left to right fem-fem bypass graft may represent a small amount of nonocclusive thrombus.  No evidence of embolization to the right lower extremity.  3.  Small postoperative hematoma versus seroma in the left inguinal incision site.  Subcutaneous  emphysema is not unexpected given the recent surgical intervention.  4.  Additional ancillary findings as above.  These results were called by telephone on 01/09/2013 at 06:00 p.m. to Dr. Aurilla Coulibaly, who verbally acknowledged these results.  Signed,  Heath K. McCullough, MD [facility]   Original Report Authenticated By: Heath McCullough, M.D.   Ct Angio Chest Aorta W/cm &/or Wo/cm  01/09/2013   *RADIOLOGY REPORT*  Clinical Data:  Evaluate axillary and subclavian arteries for planned Ax - bifem bypass  CT ANGIOGRAPHY OF CHEST, ABDOMEN AND PELVIS WITH ILIOFEMORAL RUNOFF  Technique:  Multidetector CT imaging of the chest abdomen, pelvis and lower extremities was performed using the standard protocol during bolus administration of intravenous contrast.  Multiplanar CT image reconstructions including MIPs were obtained to evaluate the vascular anatomy.  Contrast: 100mL OMNIPAQUE IOHEXOL 350 MG/ML SOLN  *RADIOLOGY REPORT*  Comparison:  Recent CTA runoff 12/31/2012  CTA CHEST  Findings:  Mediastinum: Unremarkable CT appearance of the thyroid gland.  No suspicious mediastinal or hilar adenopathy.  No soft tissue mediastinal mass. Small hiatal hernia.  Heart/Vascular: Conventional three-vessel arch anatomy.  There is moderate (60%) narrowing at the origin of the left subclavian artery secondary to noncalcified fibrofatty plaque.  The origin of the left axillary bifemoral bypass graft is widely patent.  The visualized left axillary artery is unremarkable.  Scattered calcified and noncalcified atherosclerotic plaque throughout the thoracic aorta is somewhat irregular.  No dissection or aneurysmal dilatation.  Right subclavian approach portacatheter with the tip terminating in the distal SVC.  There is also a right IJ central venous catheter with the tip terminating in the distal SVC.  Normal- caliber pulmonary artery.  No pulmonary embolus identified.  Are within normal  limits for size.  No pericardial effusion. Atherosclerotic calcifications identified in the coronary arteries.  Lungs/Pleura: Mild centrilobular emphysema.  Diffuse bronchial wall thickening suggestive of chronic bronchitis.  Mild dependent atelectasis in the lower lobes.  Small focus of nonspecific ground- glass attenuation opacity in the lingula is new and the recent interval since the prior study.  This likely represents a small focus of atelectasis.  Bones: No acute fracture or aggressive appearing lytic or blastic osseous lesion.  Review of the MIP images confirms the above findings.  IMPRESSION:  1.  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  2.  The proximal anastomosis of the left axillary bifemoral bypass graft is widely patent.  3.  Atherosclerosis including coronary artery disease.  4.  Mild centrilobular pulmonary emphysema.  5.  Additional ancillary findings as above.  CTA ABD/PELVIS + RUNOFF  Findings:  VASCULAR  Aorta: Irregular heterogeneous atherosclerotic plaque throughout the abdominal aorta.  There is significant narrowing in the infrarenal aorta which is mildly ectatic but not aneurysmally dilated.  No dissection or penetrating aortic ulcer.  No significant interval change compared to the recent prior study.  Celiac: Widely patent.  Conventional hepatic arterial anatomy.  SMA: Widely patent.  Renals: Single renal   arteries bilaterally are patent.  There is mild atherosclerotic plaque at the origin of the left artery with minimal narrowing.  IMA: Occluded at the origin.  The vessel opacifies via retrograde flow.  Right Lower Extremity:  Heterogeneous irregular calcified and noncalcified atherosclerotic plaque throughout the right common iliac artery.  There is high-grade stenosis at the origin of the right hypogastric artery.  The right external iliac artery is diffusely diseased and nearly occluded  at the inguinal ligament. Left axillary to femoral and left right fem-fem bypass graft is patent.  The right bypass graft to common femoral artery anastomosis is patent.  Distal to this, the common femoral artery demonstrates a focal moderate to high-grade narrowing without significant interval change compared to the relatively recent prior.  The right superficial femoral femoral artery is completely occluded.  Profunda remains patent.  The femoral artery reconstitutes in the abductor canal via geniculate collaterals. The popliteal artery is patent.  The runoff vessels are relatively atretic.  There is three-vessel runoff to the ankle.  Left Lower Extremity:  Chronic occlusion of the left common, internal and external iliac arteries.  Patent left axillary to femoral bypass graft.  There is a linear filling defect at the origin of the right to left fem-fem bypass graft which may represent a small amount of nonocclusive thrombus.  New fluid and subcutaneous emphysema surrounding the distal aspect of the axis bypass graft and left to right fem-fem bypass graft anastomosis consistent with recent incision.  Interval surgical changes of left common femoral patchy angioplasty there is some irregular nonocclusive thrombus along the posterior wall of the patch angioplasty site proximal to the origin of the femoral to below the knee popliteal bypass.  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  The left femoral to below the knee popliteal artery bypass graft is patent for the first centimeter and then occludes.  The vessel does not opacify throughout the entirety of its course. Profunda femoral artery remains patent.  The popliteal artery is patent via retrograde flow from profunda branches.  The runoff vessels are atretic.  There is three-vessel runoff to the ankle.  Veins: No acute venous abnormality.  Review of the MIP images confirms the above findings.  NON-VASCULAR  No significant interval change in the abdominal or  pelvic finding compared to the recent prior CT scan dated 12/31/2012.  IMPRESSION:  1.  Interval surgical changes of patchy angioplasty of the left common femoral artery and left femoral to below the knee popliteal artery saphenous vein bypass graft. There is irregular nonocclusive thrombus along the posterior wall of the common femoral artery in the region of the patchy angioplasty and the saphenous vein bypass graft is completely occluded approximately 1 cm beyond the anastomosis.  The popliteal artery reconstitutes via profunda collaterals.  There is three-vessel runoff to the ankle.  2.  Small amount of linear nonocclusive thrombus at the origin of the left to right fem-fem bypass graft may represent a small amount of nonocclusive thrombus.  No evidence of embolization to the right lower extremity.  3.  Small postoperative hematoma versus seroma in the left inguinal incision site.  Subcutaneous emphysema is not unexpected given the recent surgical intervention.  4.  Additional ancillary findings as above.  These results were called by telephone on 01/09/2013 at 06:00 p.m. to Dr. Rithy Mandley, who verbally acknowledged these results.  Signed,  Heath K. McCullough, MD [facility]   Original Report Authenticated By: Heath McCullough, M.D.      Assessment/Plan:  POD #3   LOS: 3 days  s/p Procedure(s): Femoral-Popliteal Artery Bypass INTRA OPERATIVE ARTERIOGRAM  Thrombosis of left femoral-popliteal saphenous vein graft placed 3 days ago-do not know how long this has been occluded Plan exploration in a.m. and possible replacement with Gore-Tex graft of vein not adequate Discussed with patient and family and they are in agreement   Ozie Dimaria, MD 01/09/2013 9:14 PM           

## 2013-01-09 NOTE — H&P (Signed)
Physical Medicine and Rehabilitation Admission H&P    No chief complaint on file. : HPI: Donald [name] is a 67 y.o. right-handed male well known to rehabilitation services from admission October 2013 for deconditioning related to multiple medical issues, peripheral vascular disease status post axillary bifemoral bypass 01/29/2012 as well as CVA and maintained on Aggrenox therapy and tobacco abuse. Admitted 01/06/2013 with a new blister tip of his left great toe and increased rest pain. CT angioplasty lower extremity show critical stenosis left common femoral artery just distal to the femoral bypass graft. Underwent a femoral to below knee popliteal artery bypass with patch angioplasty left common femoral artery 01/06/2013 per Dr. Darrick Penna. A intraoperative single static arteriogram image showed patent bypass graft with three-vessel runoff. Acute blood loss anemia 7.5 and transfused with followup hemoglobin of 11. Followup ABI studies indicate a moderate reduction in arterial flow on the right and a mild reduction on the left. CT angio aorta and bifemoral performed today to assess left subclavian and axillary artery as well as left femoral-popliteal runoff. Physical occupational therapy evaluations completed an ongoing was noted deconditioning. M.D. as requested physical medicine rehabilitation consult to consider inpatient rehabilitation services. Patient was felt to be a good candidate for inpatient rehabilitation services and was admitted for a comprehensive rehabilitation program  Review of Systems  Cardiovascular: Positive for leg swelling.  Gastrointestinal: Positive for constipation.  GERD  Neurological: Positive for weakness.  Psychiatric/Behavioral: Positive for depression.  All other systems reviewed and are negative   Past Medical History  Diagnosis Date  . GERD (gastroesophageal reflux disease)   . Hyperlipidemia   . Hypertension   . Peripheral vascular disease   . Type II  diabetes mellitus   . Pneumonia   . Internal and external bleeding hemorrhoids   . Stroke 2008    "left side weak; unable to move left hand still" (01/23/2012)  . History of gout     left great toe  . anal ca dx'd 08/2007    S/P chemo, radiation, biopsies  . 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 Depression   . Numbness in right leg     Hx: of diabetes  . Coronary artery disease     RCA occlusion with good collaterals, o/w no obs CAD 12/2011   Past Surgical History  Procedure Laterality Date  . Carotid endarterectomy  2008    right  . Liver cryoablation  08/2010    "chemo shrunk the cancer 50% then they went in and burned the rest of it"  . Mass biopsy      rectal; "found cancer"  . Axillary-femoral bypass graft  01/29/2012    Procedure: BYPASS GRAFT AXILLA-BIFEMORAL;  Surgeon: Sherren Kerns, MD;  Location: [facility] OR;  Service: Vascular;  Laterality: N/A;  Left axilla-bifemoral bypass using gore-tex graft.   . Colonoscopy w/ biopsies and polypectomy      Hx: of   Family History  Problem Relation Age of Onset  . Cancer - Other Sister    Social History:  reports that he has been smoking Cigarettes.  He has a 5 pack-year smoking history. He has never used smokeless tobacco. He reports that he does not drink alcohol or use illicit drugs. Allergies:  Allergies  Allergen Reactions  . Other     Became hypotensive. Pt states allergic to "muscle relaxer" but unsure of name.  . Tape Hives and Itching    medfix non woven tape   Medications Prior to Admission  Medication Sig Dispense Refill  . acidophilus (RISAQUAD) CAPS Take 1 capsule by mouth daily.       . Ascorbic Acid (VITAMIN C) 1000 MG tablet Take 1,000 mg by mouth daily.      Marland Kitchen buPROPion (ZYBAN) 150 MG 12 hr tablet Take 150 mg by mouth daily.       Marland Kitchen dipyridamole-aspirin (AGGRENOX) 200-25 MG per 12 hr capsule Take 1 capsule by mouth 2 (two) times daily.  180 capsule  1  . docusate sodium (COLACE) 100 MG capsule Take 200 mg by  mouth 2 (two) times daily.      Marland Kitchen gabapentin (NEURONTIN) 300 MG capsule Take 300 mg by mouth 3 (three) times daily.      Marland Kitchen lisinopril (PRINIVIL,ZESTRIL) 5 MG tablet Take 0.5 tablets (2.5 mg total) by mouth daily.      . metFORMIN (GLUMETZA) 500 MG (MOD) 24 hr tablet Take 500 mg by mouth 2 (two) times daily with a meal.       . mupirocin ointment (BACTROBAN) 2 % 1 Application, nasally, 2 times daily for 5 days prior to surgery  22 g  0  . omega-3 acid ethyl esters (LOVAZA) 1 G capsule Take 2 g by mouth 2 (two) times daily.       Marland Kitchen omeprazole (PRILOSEC) 40 MG capsule Take 40 mg by mouth daily.      . Probiotic Product (ALIGN PO) Take 1 tablet by mouth at bedtime.        Home: Home Living Family/patient expects to be discharged to:: Private residence Living Arrangements: Spouse/significant other Available Help at Discharge: Family Type of Home: House Home Access: Stairs to enter Secretary/administrator of Steps: 10 Entrance Stairs-Rails: Right;Left Home Layout: Two level;Bed/bath upstairs Alternate Level Stairs-Number of Steps: flight Alternate Level Stairs-Rails: Right;Left (chair lift) Home Equipment: Walker - 2 wheels;Cane - single point;Bedside commode;Shower seat;Hand held shower head;Grab bars - tub/shower Additional Comments: Pt has a stair chair lift now for going upstairs.    Functional History: Prior Function Comments: used cane PTA  Functional Status:  Mobility: Bed Mobility Bed Mobility: Not assessed Transfers Transfers: Sit to Stand;Stand to Sit Sit to Stand: 3: Mod assist Stand to Sit: 3: Mod assist Ambulation/Gait Ambulation/Gait Assistance: 4: Min assist;3: Mod assist Ambulation Distance (Feet): 12 Feet Assistive device: Rolling walker Ambulation/Gait Assistance Details: Pt with increased pain and stiffness in LLE, did not wish to continue ambulation Gait Pattern: Step-to pattern;Decreased step length - left;Decreased weight shift to left;Shuffle;Trunk  flexed;Wide base of support Gait velocity: decreased Stairs: No Wheelchair Mobility Wheelchair Mobility: No  ADL: ADL Eating/Feeding: Supervision/safety;Set up Grooming: Minimal assistance Where Assessed - Grooming: Supported sitting Upper Body Bathing: Moderate assistance Where Assessed - Upper Body Bathing: Supported sitting Lower Body Bathing: +1 Total assistance Where Assessed - Lower Body Bathing: Supported sit to stand Upper Body Dressing: Moderate assistance Where Assessed - Upper Body Dressing: Unsupported sitting Lower Body Dressing: +1 Total assistance Where Assessed - Lower Body Dressing: Supported sit to Pharmacist, hospital:  (not assessed due to pain) Transfers/Ambulation Related to ADLs: pt declined ADL Comments: focus of sessionon mobility. Wife assisting with toileting.   Cognition: Cognition Overall Cognitive Status: Within Functional Limits for tasks assessed (most likely baseline cognitive impairments from CVA) Orientation Level: Oriented X4 Cognition Arousal/Alertness: Awake/alert Behavior During Therapy: WFL for tasks assessed/performed Overall Cognitive Status: Within Functional Limits for tasks assessed (most likely baseline cognitive impairments from CVA)  Physical Exam: Blood pressure 131/59, pulse 89, temperature 98 F (  36.7 C), temperature source Oral, resp. rate 18, height 5\' 9"  (1.753 m), weight 90.3 kg (199 lb 1.2 oz), SpO2 98.00%. Constitutional: He is oriented to person, place, and time. No distress HENT:  Head: Normocephalic. Tongue pink,moist Eyes: EOM are normal.  Neck: Normal range of motion. Neck supple. No thyromegaly present.  Cardiovascular: Normal rate and irregular rhythm. No murmur Pulmonary/Chest: Effort normal and breath sounds normal. No respiratory distress. No wheezes Abdominal: Soft. Bowel sounds are normal. He exhibits no distension. There is no tenderness.  Neurological: He is alert and oriented to person, place, and  time. Musc: left leg with 1++ edema, tender along incision   Left hemiparesis, sensory deficits. LUE is 2/5 with apraxia, inhibited by tone which was variable, 2-3/4. LLE is grossly 1-2/5 but dramatically inhibited by pain/swelling in left leg. Left central 7 with good tongue control. Speech clear. Cognitively with good insight and awareness.  Skin:  Incision site clean and dry, chronic vascular changes.   Psychiatric: He has a normal mood and affect   Results for orders placed during the hospital encounter of 01/06/13 (from the past 48 hour(s))  GLUCOSE, CAPILLARY     Status: Abnormal   Collection Time    01/07/13  8:41 AM      Result Value Range   Glucose-Capillary 203 (*) 70 - 99 mg/dL   Comment 1 Documented in Chart     Comment 2 Notify RN    GLUCOSE, CAPILLARY     Status: Abnormal   Collection Time    01/07/13 12:52 PM      Result Value Range   Glucose-Capillary 159 (*) 70 - 99 mg/dL  GLUCOSE, CAPILLARY     Status: Abnormal   Collection Time    01/07/13  4:18 PM      Result Value Range   Glucose-Capillary 123 (*) 70 - 99 mg/dL  GLUCOSE, CAPILLARY     Status: Abnormal   Collection Time    01/07/13  9:07 PM      Result Value Range   Glucose-Capillary 116 (*) 70 - 99 mg/dL  CBC     Status: Abnormal   Collection Time    01/08/13  5:10 AM      Result Value Range   WBC 7.9  4.0 - 10.5 K/uL   RBC 2.83 (*) 4.22 - 5.81 MIL/uL   Hemoglobin 7.5 (*) 13.0 - 17.0 g/dL   HCT 13.0 (*) 86.5 - 78.4 %   MCV 82.3  78.0 - 100.0 fL   MCH 26.5  26.0 - 34.0 pg   MCHC 32.2  30.0 - 36.0 g/dL   RDW 69.6 (*) 29.5 - 28.4 %   Platelets 84 (*) 150 - 400 K/uL   Comment: PLATELET COUNT CONFIRMED BY SMEAR  BASIC METABOLIC PANEL     Status: Abnormal   Collection Time    01/08/13  5:10 AM      Result Value Range   Sodium 135  135 - 145 mEq/L   Potassium 3.9  3.5 - 5.1 mEq/L   Chloride 103  96 - 112 mEq/L   CO2 23  19 - 32 mEq/L   Glucose, Bld 176 (*) 70 - 99 mg/dL   BUN 9  6 - 23 mg/dL    Creatinine, Ser 1.32  0.50 - 1.35 mg/dL   Calcium 8.2 (*) 8.4 - 10.5 mg/dL   GFR calc non Af Amer 66 (*) >90 mL/min   GFR calc Af Amer 77 (*) >90 mL/min  Comment: (NOTE)     The eGFR has been calculated using the CKD EPI equation.     This calculation has not been validated in all clinical situations.     eGFR's persistently <90 mL/min signify possible Chronic Kidney     Disease.  GLUCOSE, CAPILLARY     Status: Abnormal   Collection Time    01/08/13  6:09 AM      Result Value Range   Glucose-Capillary 162 (*) 70 - 99 mg/dL  PREPARE RBC (CROSSMATCH)     Status: None   Collection Time    01/08/13  7:46 AM      Result Value Range   Order Confirmation BB SAMPLE OR UNITS ALREADY AVAILABLE    GLUCOSE, CAPILLARY     Status: Abnormal   Collection Time    01/08/13 11:29 AM      Result Value Range   Glucose-Capillary 164 (*) 70 - 99 mg/dL  GLUCOSE, CAPILLARY     Status: Abnormal   Collection Time    01/08/13  4:34 PM      Result Value Range   Glucose-Capillary 161 (*) 70 - 99 mg/dL  GLUCOSE, CAPILLARY     Status: Abnormal   Collection Time    01/08/13  8:53 PM      Result Value Range   Glucose-Capillary 168 (*) 70 - 99 mg/dL   Comment 1 Documented in Chart     Comment 2 Notify RN    HEMOGLOBIN AND HEMATOCRIT, BLOOD     Status: Abnormal   Collection Time    01/08/13 11:30 PM      Result Value Range   Hemoglobin 9.9 (*) 13.0 - 17.0 g/dL   Comment: POST TRANSFUSION SPECIMEN   HCT 29.6 (*) 39.0 - 52.0 %  CBC     Status: Abnormal   Collection Time    01/09/13  5:10 AM      Result Value Range   WBC 10.4  4.0 - 10.5 K/uL   RBC 3.95 (*) 4.22 - 5.81 MIL/uL   Hemoglobin 11.0 (*) 13.0 - 17.0 g/dL   HCT 16.1 (*) 09.6 - 04.5 %   MCV 83.3  78.0 - 100.0 fL   MCH 27.8  26.0 - 34.0 pg   MCHC 33.4  30.0 - 36.0 g/dL   RDW 40.9 (*) 81.1 - 91.4 %   Platelets 197  150 - 400 K/uL   Comment: REPEATED TO VERIFY  GLUCOSE, CAPILLARY     Status: Abnormal   Collection Time    01/09/13  6:42 AM       Result Value Range   Glucose-Capillary 156 (*) 70 - 99 mg/dL   Comment 1 Notify RN     Comment 2 Documented in Chart     No results found.  Post Admission Physician Evaluation: 1. 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 2. Patient is admitted to receive collaborative, interdisciplinary care between the physiatrist, rehab nursing staff, and therapy team. 3. Patient's level of medical complexity and substantial therapy needs in context of that medical necessity cannot be provided at a lesser intensity of care such as a SNF. 4. Patient has experienced substantial functional loss from his/her baseline which was documented above under the "Functional History" and "Functional Status" headings.  Judging by the patient's diagnosis, physical exam, and functional history, the patient has potential for functional progress which will result in measurable gains  while on inpatient rehab.  These gains will be of substantial and practical use upon discharge  in facilitating mobility and self-care at the household level. 5. Physiatrist will provide 24 hour management of medical needs as well as oversight of the therapy plan/treatment and provide guidance as appropriate regarding the interaction of the two. 6. 24 hour rehab nursing will assist with bladder management, bowel management, safety, skin/wound care, disease management, medication administration, pain management and patient education  and help integrate therapy concepts, techniques,education, etc. 7. PT will assess and treat for/with: Lower extremity strength, range of motion, stamina, balance, functional mobility, safety, adaptive techniques and equipment, NMR, pain mgt, education.   Goals are: supervision to minimal assist. 8. OT will assess and treat for/with: ADL's, functional mobility, safety, upper extremity strength, adaptive techniques and equipment, NMR,  pain mgt.   Goals are: supervision to minimal assist. 9. SLP will assess and treat for/with: n/a.  Goals are: n/a. 10. Case Management and Social Worker will assess and treat for psychological issues and discharge planning. 11. Team conference will be held weekly to assess progress toward goals and to determine barriers to discharge. 12. Patient will receive at least 3 hours of therapy per day at least 5 days per week. 13. ELOS: 10 days       14. Prognosis:  excellent   Medical Problem List and Plan: 1. Weakness/impaired gait due to severe PVD status post left FPBG and history of CVA with left hemiparesis 2. DVT Prophylaxis/Anticoagulation: Support stockings. Monitor for any signs of DVT 3. Pain Management: Neurontin 300 mg 3 times a day, Percocet as needed. Monitor with increased mobility  -elevate leg while in bed/chair 4. Neuropsych: This patient is capable of making decisions on his own behalf. 5. Acute blood loss anemia. Patient has been transfused. Will followup CBC 6. Hypertension. Lisinopril as directed. Monitor with increased mobility 7. Diabetes mellitus with peripheral neuropathy. Hemoglobin A1c 7.4. Glucophage 500 mg twice a day. Check blood sugars a.c. and at bedtime 8. Tobacco abuse. Continue Zyban and provide counseling 9. GERD. Protonix  Ranelle Oyster, MD, [facility] Physical Medicine & Rehabilitation  01/09/2013

## 2013-01-10 ENCOUNTER — Inpatient Hospital Stay (HOSPITAL_COMMUNITY): Payer: Medicare Other

## 2013-01-10 ENCOUNTER — Encounter (HOSPITAL_COMMUNITY)
Admission: RE | Disposition: A | Discharge status: Rehabilitation Facility | Payer: Self-pay | Source: Ambulatory Visit | Attending: Vascular Surgery

## 2013-01-10 ENCOUNTER — Inpatient Hospital Stay (HOSPITAL_COMMUNITY): Payer: Medicare Other | Admitting: *Deleted

## 2013-01-10 ENCOUNTER — Encounter (HOSPITAL_COMMUNITY): Payer: Self-pay | Admitting: Anesthesiology

## 2013-01-10 ENCOUNTER — Inpatient Hospital Stay (HOSPITAL_COMMUNITY): Payer: Medicare Other | Admitting: Anesthesiology

## 2013-01-10 ENCOUNTER — Inpatient Hospital Stay (HOSPITAL_COMMUNITY): Payer: Medicare Other | Admitting: Physical Therapy

## 2013-01-10 DIAGNOSIS — T82898A Other specified complication of vascular prosthetic devices, implants and grafts, initial encounter: Secondary | ICD-10-CM

## 2013-01-10 HISTORY — PX: FEMORAL-POPLITEAL BYPASS GRAFT: SHX937

## 2013-01-10 LAB — CBC
HCT: 30.8 % — ABNORMAL LOW (ref 39.0–52.0)
Hemoglobin: 9.9 g/dL — ABNORMAL LOW (ref 13.0–17.0)
MCH: 27.3 pg (ref 26.0–34.0)
MCHC: 32.1 g/dL (ref 30.0–36.0)
MCV: 85.1 fL (ref 78.0–100.0)

## 2013-01-10 LAB — POCT I-STAT 4, (NA,K, GLUC, HGB,HCT)
Glucose, Bld: 175 mg/dL — ABNORMAL HIGH (ref 70–99)
Potassium: 5.8 mEq/L — ABNORMAL HIGH (ref 3.5–5.1)
Sodium: 134 mEq/L — ABNORMAL LOW (ref 135–145)

## 2013-01-10 LAB — GLUCOSE, CAPILLARY: Glucose-Capillary: 172 mg/dL — ABNORMAL HIGH (ref 70–99)

## 2013-01-10 LAB — APTT: aPTT: 24 seconds (ref 24–37)

## 2013-01-10 SURGERY — BYPASS GRAFT FEMORAL-POPLITEAL ARTERY
Anesthesia: General | Site: Leg Upper | Laterality: Left | Wound class: Clean

## 2013-01-10 MED ORDER — FENTANYL CITRATE 0.05 MG/ML IJ SOLN
INTRAMUSCULAR | Status: AC
  Administered 2013-01-10: 25 ug via INTRAVENOUS
  Filled 2013-01-10: qty 2

## 2013-01-10 MED ORDER — ALUM & MAG HYDROXIDE-SIMETH 200-200-20 MG/5ML PO SUSP: 15.0000 mL | ORAL | Status: DC | PRN

## 2013-01-10 MED ORDER — LISINOPRIL 2.5 MG PO TABS: 0.5000 mg | ORAL_TABLET | Freq: Two times a day (BID) | ORAL | Status: DC

## 2013-01-10 MED ORDER — ONDANSETRON HCL 4 MG/2ML IJ SOLN
4.0000 mg | Freq: Four times a day (QID) | INTRAMUSCULAR | Status: DC | PRN
  Administered 2013-01-11: 4 mg via INTRAVENOUS

## 2013-01-10 MED ORDER — 0.9 % SODIUM CHLORIDE (POUR BTL) OPTIME
TOPICAL | Status: DC | PRN
  Administered 2013-01-10: 1000 mL

## 2013-01-10 MED ORDER — OXYCODONE-ACETAMINOPHEN 5-325 MG PO TABS
1.0000 | ORAL_TABLET | ORAL | Status: DC | PRN
  Administered 2013-01-10: 1 via ORAL
  Administered 2013-01-10 – 2013-01-12 (×9): 2 via ORAL
  Filled 2013-01-10: qty 1
  Filled 2013-01-10 (×9): qty 2

## 2013-01-10 MED ORDER — SODIUM CHLORIDE 0.9 % IR SOLN
Status: DC | PRN
  Administered 2013-01-10: 09:00:00

## 2013-01-10 MED ORDER — PROTAMINE SULFATE 10 MG/ML IV SOLN
INTRAVENOUS | Status: DC | PRN
  Administered 2013-01-10 (×5): 10 mg via INTRAVENOUS

## 2013-01-10 MED ORDER — SORBITOL 70 % SOLN
30.0000 mL | Freq: Every day | Status: DC | PRN
  Filled 2013-01-10: qty 30

## 2013-01-10 MED ORDER — BUPROPION HCL ER (SMOKING DET) 150 MG PO TB12
150.0000 mg | ORAL_TABLET | Freq: Every day | ORAL | Status: DC
  Administered 2013-01-11 – 2013-01-13 (×3): 150 mg via ORAL
  Filled 2013-01-10 (×3): qty 1

## 2013-01-10 MED ORDER — LISINOPRIL 2.5 MG PO TABS
2.5000 mg | ORAL_TABLET | Freq: Every day | ORAL | Status: DC
  Administered 2013-01-10 – 2013-01-13 (×3): 2.5 mg via ORAL
  Filled 2013-01-10 (×4): qty 1

## 2013-01-10 MED ORDER — RISAQUAD PO CAPS
1.0000 | ORAL_CAPSULE | Freq: Every day | ORAL | Status: DC
  Administered 2013-01-11 – 2013-01-13 (×3): 1 via ORAL
  Filled 2013-01-10 (×3): qty 1

## 2013-01-10 MED ORDER — ACETAMINOPHEN 325 MG RE SUPP
325.0000 mg | RECTAL | Status: DC | PRN
  Filled 2013-01-10: qty 2

## 2013-01-10 MED ORDER — ONDANSETRON HCL 4 MG PO TABS: 4.0000 mg | ORAL_TABLET | Freq: Four times a day (QID) | ORAL | Status: DC | PRN

## 2013-01-10 MED ORDER — PANTOPRAZOLE SODIUM 40 MG PO TBEC
40.0000 mg | DELAYED_RELEASE_TABLET | Freq: Every day | ORAL | Status: DC
  Administered 2013-01-11 – 2013-01-13 (×3): 40 mg via ORAL
  Filled 2013-01-10 (×3): qty 1

## 2013-01-10 MED ORDER — PROPOFOL 10 MG/ML IV BOLUS
INTRAVENOUS | Status: DC | PRN
  Administered 2013-01-10: 120 mg via INTRAVENOUS

## 2013-01-10 MED ORDER — SUCCINYLCHOLINE CHLORIDE 20 MG/ML IJ SOLN
INTRAMUSCULAR | Status: DC | PRN
  Administered 2013-01-10: 50 mg via INTRAVENOUS
  Administered 2013-01-10: 100 mg via INTRAVENOUS

## 2013-01-10 MED ORDER — DOPAMINE-DEXTROSE 3.2-5 MG/ML-% IV SOLN: 3.0000 ug/kg/min | INTRAVENOUS | Status: DC

## 2013-01-10 MED ORDER — FENTANYL CITRATE 0.05 MG/ML IJ SOLN
25.0000 ug | INTRAMUSCULAR | Status: DC | PRN
  Administered 2013-01-10 (×4): 25 ug via INTRAVENOUS

## 2013-01-10 MED ORDER — DEXTROSE 5 % IV SOLN
1.5000 g | Freq: Three times a day (TID) | INTRAVENOUS | Status: AC
  Administered 2013-01-10 (×2): 1.5 g via INTRAVENOUS
  Filled 2013-01-10 (×2): qty 1.5

## 2013-01-10 MED ORDER — SODIUM CHLORIDE 0.9 % IV SOLN
500.0000 mL | Freq: Once | INTRAVENOUS | Status: AC | PRN
  Administered 2013-01-11 (×2): 500 mL via INTRAVENOUS

## 2013-01-10 MED ORDER — HEPARIN (PORCINE) IN NACL 100-0.45 UNIT/ML-% IJ SOLN
600.0000 [IU]/h | INTRAMUSCULAR | Status: DC
  Administered 2013-01-10: 600 [IU]/h via INTRAVENOUS
  Filled 2013-01-10: qty 250

## 2013-01-10 MED ORDER — DEXTROSE 5 % IV SOLN
1.5000 g | Freq: Two times a day (BID) | INTRAVENOUS | Status: DC
  Filled 2013-01-10: qty 1.5

## 2013-01-10 MED ORDER — DEXTROSE 5 % IV SOLN
1.5000 g | Freq: Three times a day (TID) | INTRAVENOUS | Status: DC
  Filled 2013-01-10 (×2): qty 1.5

## 2013-01-10 MED ORDER — LACTATED RINGERS IV SOLN
INTRAVENOUS | Status: DC | PRN
  Administered 2013-01-10 (×2): via INTRAVENOUS

## 2013-01-10 MED ORDER — DOCUSATE SODIUM 100 MG PO CAPS
100.0000 mg | ORAL_CAPSULE | Freq: Every day | ORAL | Status: DC
  Administered 2013-01-11: 100 mg via ORAL
  Filled 2013-01-10: qty 1

## 2013-01-10 MED ORDER — PHENYLEPHRINE HCL 10 MG/ML IJ SOLN
INTRAMUSCULAR | Status: DC | PRN
  Administered 2013-01-10: 80 ug via INTRAVENOUS

## 2013-01-10 MED ORDER — ENOXAPARIN SODIUM 30 MG/0.3ML ~~LOC~~ SOLN
30.0000 mg | SUBCUTANEOUS | Status: DC
  Administered 2013-01-11 – 2013-01-13 (×3): 30 mg via SUBCUTANEOUS
  Filled 2013-01-10 (×4): qty 0.3

## 2013-01-10 MED ORDER — FENTANYL CITRATE 0.05 MG/ML IJ SOLN
INTRAMUSCULAR | Status: DC | PRN
  Administered 2013-01-10: 100 ug via INTRAVENOUS

## 2013-01-10 MED ORDER — OMEGA-3-ACID ETHYL ESTERS 1 G PO CAPS
2.0000 g | ORAL_CAPSULE | Freq: Two times a day (BID) | ORAL | Status: DC
  Administered 2013-01-10 – 2013-01-13 (×6): 2 g via ORAL
  Filled 2013-01-10 (×7): qty 2

## 2013-01-10 MED ORDER — IOHEXOL 300 MG/ML  SOLN
INTRAMUSCULAR | Status: DC | PRN
  Administered 2013-01-10: 30 mL via INTRAVENOUS

## 2013-01-10 MED ORDER — METFORMIN HCL ER 500 MG PO TB24
500.0000 mg | ORAL_TABLET | Freq: Two times a day (BID) | ORAL | Status: DC
  Administered 2013-01-10 – 2013-01-13 (×6): 500 mg via ORAL
  Filled 2013-01-10 (×8): qty 1

## 2013-01-10 MED ORDER — DROPERIDOL 2.5 MG/ML IJ SOLN: 0.6250 mg | INTRAMUSCULAR | Status: DC | PRN

## 2013-01-10 MED ORDER — LIDOCAINE HCL (CARDIAC) 20 MG/ML IV SOLN
INTRAVENOUS | Status: DC | PRN
  Administered 2013-01-10: 50 mg via INTRAVENOUS

## 2013-01-10 MED ORDER — ALBUMIN HUMAN 5 % IV SOLN
INTRAVENOUS | Status: DC | PRN
  Administered 2013-01-10: 09:00:00 via INTRAVENOUS

## 2013-01-10 MED ORDER — ASPIRIN-DIPYRIDAMOLE ER 25-200 MG PO CP12
1.0000 | ORAL_CAPSULE | Freq: Two times a day (BID) | ORAL | Status: DC
  Administered 2013-01-10 – 2013-01-13 (×6): 1 via ORAL
  Filled 2013-01-10 (×7): qty 1

## 2013-01-10 MED ORDER — GUAIFENESIN-DM 100-10 MG/5ML PO SYRP: 15.0000 mL | ORAL_SOLUTION | ORAL | Status: DC | PRN

## 2013-01-10 MED ORDER — INSULIN ASPART 100 UNIT/ML ~~LOC~~ SOLN
0.0000 [IU] | Freq: Three times a day (TID) | SUBCUTANEOUS | Status: DC
  Administered 2013-01-10 – 2013-01-11 (×2): 3 [IU] via SUBCUTANEOUS
  Administered 2013-01-11: 2 [IU] via SUBCUTANEOUS
  Administered 2013-01-11: 3 [IU] via SUBCUTANEOUS
  Administered 2013-01-12: 2 [IU] via SUBCUTANEOUS
  Administered 2013-01-12: 3 [IU] via SUBCUTANEOUS
  Administered 2013-01-13: 2 [IU] via SUBCUTANEOUS
  Administered 2013-01-13: 3 [IU] via SUBCUTANEOUS

## 2013-01-10 MED ORDER — SENNOSIDES-DOCUSATE SODIUM 8.6-50 MG PO TABS
1.0000 | ORAL_TABLET | Freq: Every evening | ORAL | Status: DC | PRN
  Filled 2013-01-10: qty 1

## 2013-01-10 MED ORDER — ACETAMINOPHEN 325 MG PO TABS: 325.0000 mg | ORAL_TABLET | ORAL | Status: DC | PRN

## 2013-01-10 MED ORDER — SODIUM CHLORIDE 0.9 % IV SOLN
INTRAVENOUS | Status: DC
  Administered 2013-01-10: 14:00:00 via INTRAVENOUS

## 2013-01-10 MED ORDER — GABAPENTIN 300 MG PO CAPS
300.0000 mg | ORAL_CAPSULE | Freq: Three times a day (TID) | ORAL | Status: DC
  Administered 2013-01-10 – 2013-01-13 (×10): 300 mg via ORAL
  Filled 2013-01-10 (×11): qty 1

## 2013-01-10 MED ORDER — HEPARIN SODIUM (PORCINE) 1000 UNIT/ML IJ SOLN
INTRAMUSCULAR | Status: DC | PRN
  Administered 2013-01-10: 6000 [IU] via INTRAVENOUS

## 2013-01-10 MED ORDER — BISACODYL 10 MG RE SUPP: 10.0000 mg | Freq: Every day | RECTAL | Status: DC | PRN

## 2013-01-10 MED ORDER — MAGNESIUM SULFATE 40 MG/ML IJ SOLN
2.0000 g | Freq: Once | INTRAMUSCULAR | Status: AC | PRN
  Filled 2013-01-10: qty 50

## 2013-01-10 SURGICAL SUPPLY — 71 items
ADH SKN CLS APL DERMABOND .7 (GAUZE/BANDAGES/DRESSINGS) ×2
ADH SKN CLS LQ APL DERMABOND (GAUZE/BANDAGES/DRESSINGS) ×2
BANDAGE ESMARK 6X9 LF (GAUZE/BANDAGES/DRESSINGS) IMPLANT
BNDG CMPR 9X6 STRL LF SNTH (GAUZE/BANDAGES/DRESSINGS)
BNDG ESMARK 6X9 LF (GAUZE/BANDAGES/DRESSINGS)
BOOT SUTURE AID YELLOW STND (SUTURE) IMPLANT
CANISTER SUCTION 2500CC (MISCELLANEOUS) ×3 IMPLANT
CATH EMB 3FR 80CM (CATHETERS) ×1 IMPLANT
CATH EMB 4FR 80CM (CATHETERS) ×1 IMPLANT
CLIP TI MEDIUM 24 (CLIP) ×3 IMPLANT
CLIP TI WIDE RED SMALL 24 (CLIP) ×3 IMPLANT
COVER SURGICAL LIGHT HANDLE (MISCELLANEOUS) ×3 IMPLANT
DECANTER SPIKE VIAL GLASS SM (MISCELLANEOUS) IMPLANT
DERMABOND ADHESIVE PROPEN (GAUZE/BANDAGES/DRESSINGS) ×1
DERMABOND ADVANCED (GAUZE/BANDAGES/DRESSINGS) ×1
DERMABOND ADVANCED .7 DNX12 (GAUZE/BANDAGES/DRESSINGS) ×2 IMPLANT
DERMABOND ADVANCED .7 DNX6 (GAUZE/BANDAGES/DRESSINGS) IMPLANT
DRAIN SNY 10X20 3/4 PERF (WOUND CARE) ×2 IMPLANT
DRAPE WARM FLUID 44X44 (DRAPE) ×3 IMPLANT
DRAPE X-RAY CASS 24X20 (DRAPES) ×1 IMPLANT
DRSG COVADERM 4X6 (GAUZE/BANDAGES/DRESSINGS) ×1 IMPLANT
ELECT REM PT RETURN 9FT ADLT (ELECTROSURGICAL) ×3
ELECTRODE REM PT RTRN 9FT ADLT (ELECTROSURGICAL) ×2 IMPLANT
EVACUATOR SILICONE 100CC (DRAIN) ×1 IMPLANT
GLOVE BIO SURGEON STRL SZ7 (GLOVE) ×1 IMPLANT
GLOVE BIOGEL PI IND STRL 6.5 (GLOVE) IMPLANT
GLOVE BIOGEL PI IND STRL 7.0 (GLOVE) IMPLANT
GLOVE BIOGEL PI INDICATOR 6.5 (GLOVE) ×3
GLOVE BIOGEL PI INDICATOR 7.0 (GLOVE) ×1
GLOVE SS BIOGEL STRL SZ 7 (GLOVE) ×2 IMPLANT
GLOVE SS BIOGEL STRL SZ 7.5 (GLOVE) ×2 IMPLANT
GLOVE SUPERSENSE BIOGEL SZ 7 (GLOVE) ×1
GLOVE SUPERSENSE BIOGEL SZ 7.5 (GLOVE) ×2
GOWN STRL NON-REIN LRG LVL3 (GOWN DISPOSABLE) ×9 IMPLANT
GRAFT PROPATEN W/RING 6X80X60 (Vascular Products) ×1 IMPLANT
INSERT FOGARTY SM (MISCELLANEOUS) ×3 IMPLANT
KIT BASIN OR (CUSTOM PROCEDURE TRAY) ×3 IMPLANT
KIT REMOVER STAPLE SKIN (MISCELLANEOUS) ×1 IMPLANT
KIT ROOM TURNOVER OR (KITS) ×3 IMPLANT
NS IRRIG 1000ML POUR BTL (IV SOLUTION) ×6 IMPLANT
PACK PERIPHERAL VASCULAR (CUSTOM PROCEDURE TRAY) ×3 IMPLANT
PAD ARMBOARD 7.5X6 YLW CONV (MISCELLANEOUS) ×6 IMPLANT
PADDING CAST COTTON 6X4 STRL (CAST SUPPLIES) IMPLANT
SET COLLECT BLD 21X3/4 12 (NEEDLE) ×2 IMPLANT
SET COLLECT BLD 21X3/4 12 PB (MISCELLANEOUS) ×1 IMPLANT
SPONGE GAUZE 4X4 12PLY (GAUZE/BANDAGES/DRESSINGS) ×1 IMPLANT
STOPCOCK 4 WAY LG BORE MALE ST (IV SETS) ×1 IMPLANT
SUT ETHILON 3 0 PS 1 (SUTURE) ×2 IMPLANT
SUT PROLENE 6 0 BV (SUTURE) ×1 IMPLANT
SUT PROLENE 6 0 C 1 24 (SUTURE) ×1 IMPLANT
SUT PROLENE 6 0 CC (SUTURE) ×8 IMPLANT
SUT PROLENE 7 0 BV 1 (SUTURE) IMPLANT
SUT PROLENE 7 0 BV1 MDA (SUTURE) IMPLANT
SUT SILK 2 0 FS (SUTURE) ×4 IMPLANT
SUT SILK 2 0 SH (SUTURE) ×5 IMPLANT
SUT SILK 3 0 (SUTURE)
SUT SILK 3-0 18XBRD TIE 12 (SUTURE) IMPLANT
SUT VIC AB 2-0 CT1 18 (SUTURE) ×2 IMPLANT
SUT VIC AB 2-0 CTX 36 (SUTURE) ×6 IMPLANT
SUT VIC AB 3-0 SH 27 (SUTURE) ×6
SUT VIC AB 3-0 SH 27X BRD (SUTURE) ×4 IMPLANT
SUT VIC AB 3-0 SH 8-18 (SUTURE) ×1 IMPLANT
SUT VICRYL 4-0 PS2 18IN ABS (SUTURE) ×2 IMPLANT
SYR TB 1ML LUER SLIP (SYRINGE) ×2 IMPLANT
TAPE CLOTH SURG 6X10 WHT LF (GAUZE/BANDAGES/DRESSINGS) ×2 IMPLANT
TOWEL OR 17X24 6PK STRL BLUE (TOWEL DISPOSABLE) ×6 IMPLANT
TOWEL OR 17X26 10 PK STRL BLUE (TOWEL DISPOSABLE) ×6 IMPLANT
TRAY FOLEY CATH 16FRSI W/METER (SET/KITS/TRAYS/PACK) ×3 IMPLANT
TUBING EXTENTION W/L.L. (IV SETS) ×2 IMPLANT
UNDERPAD 30X30 INCONTINENT (UNDERPADS AND DIAPERS) ×3 IMPLANT
WATER STERILE IRR 1000ML POUR (IV SOLUTION) ×3 IMPLANT

## 2013-01-10 NOTE — Interval H&P Note (Signed)
History and Physical Interval Note:  01/10/2013 7:27 AM  Donald [name]  has presented today for surgery, with the diagnosis of occluded fem pop  The various methods of treatment have been discussed with the patient and family. After consideration of risks, benefits and other options for treatment, the patient has consented to  Procedure(s): THROMBECTOMY POSSIBLE BYPASS GRAFT FEMORAL-POPLITEAL ARTERY (Left) as a surgical intervention .  The patient's history has been reviewed, patient examined, no change in status, stable for surgery.  I have reviewed the patient's chart and labs.  Questions were answered to the patient's satisfaction.     Josephina Gip

## 2013-01-10 NOTE — Transfer of Care (Signed)
Immediate Anesthesia Transfer of Care Note  Patient: Donald [name]  Procedure(s) Performed: Procedure(s) with comments: THROMBECTOMY POSSIBLE BYPASS GRAFT FEMORAL-POPLITEAL ARTERY (Left) - Attempted Thrombectomy of left Femoral/Popliteal graft - Unsuccessful.  Insertion of Left femoral to below the knee popliteal propaten graft. ANGIOGRAM EXTREMITY LEFT (Left) - angiogram of left lower extrimity.  Patient Location: PACU  Anesthesia Type:General  Level of Consciousness: awake and alert   Airway & Oxygen Therapy: Patient Spontanous Breathing and Patient connected to nasal cannula oxygen  Post-op Assessment: Report given to PACU RN and Post -op Vital signs reviewed and stable  Post vital signs: Reviewed and stable  Complications: No apparent anesthesia complications

## 2013-01-10 NOTE — Anesthesia Preprocedure Evaluation (Signed)
Anesthesia Evaluation  Patient identified by MRN, date of birth, ID band Patient awake    Reviewed: Allergy & Precautions, H&P , NPO status , Patient's Chart, lab work & pertinent test results  Airway Mallampati: II TM Distance: >3 FB Neck ROM: Full    Dental  (+) Edentulous Upper, Poor Dentition and Dental Advisory Given   Pulmonary COPDCurrent Smoker,  breath sounds clear to auscultation  Pulmonary exam normal       Cardiovascular hypertension, + CAD and + Peripheral Vascular Disease Rhythm:Regular Rate:Normal  9/13 ECHO: EF 55-60%, valves OK '13 cath: ASCADz with RCA occlusion   Neuro/Psych CVA (L hemiparesis), Residual Symptoms    GI/Hepatic Neg liver ROS, GERD-  Medicated and Controlled,Anal cancer: chemo   Endo/Other  diabetes (glu 129), Well Controlled, Type 2, Oral Hypoglycemic Agents  Renal/GU negative Renal ROS     Musculoskeletal   Abdominal   Peds  Hematology   Anesthesia Other Findings   Reproductive/Obstetrics                           Anesthesia Physical Anesthesia Plan  ASA: III  Anesthesia Plan: General   Post-op Pain Management:    Induction: Intravenous  Airway Management Planned: Oral ETT  Additional Equipment:   Intra-op Plan:   Post-operative Plan: Extubation in OR  Informed Consent: I have reviewed the patients History and Physical, chart, labs and discussed the procedure including the risks, benefits and alternatives for the proposed anesthesia with the patient or authorized representative who has indicated his/her understanding and acceptance.   Dental advisory given  Plan Discussed with: CRNA and Surgeon  Anesthesia Plan Comments: (Plan routine monitors, GETA)        Anesthesia Quick Evaluation

## 2013-01-10 NOTE — Op Note (Signed)
OPERATIVE REPORT  Date of Surgery: 01/06/2013 - 01/10/2013  Surgeon: Josephina Gip, MD  Assistant: Lianne Cure PA  Pre-op Diagnosis: occluded left femoral-popliteal saphenous vein graft placed 4 days ago  Post-op Diagnosis: Same  Procedure: Procedure(s) #1 attempted thrombectomy left femoral-popliteal saphenous vein graft-unsuccessful #2 insertion of new left common femoral to popliteal (below knee) bypass using a 6 mm Gore-Tex-propaten graft #3 intraoperative arteriogram left leg  Anesthesia: General  EBL: 200 cc  Complications: None  The patient was taken to the operating room placed in the supine position at which time satisfactory general endotracheal anesthesia was administered. The left leg was prepped with Betadine scrub and solution draped in routine sterile manner. The left inguinal and left below knee medial popliteal incision for rehabilitation and to expose the thrombosed vein graft. There was a moderate amount of serous fluid in the inguinal wound which was cultured but did not appear affected. The left axillobifemoral graft providing inflow and this was controlled proximally as was the native common femoral artery proximally and the superficial femoral and profunda distally. The popliteal artery was controlled proximal and distal to the vein anastomosis which was thrombosed. The patient was then heparinized. A longitudinal opening was made in the vein graft distally over the popliteal anastomosis. A 4 Fogarty catheter passed up the vein graft. Initially I could get almost throughout the whole length of the vein graft but additional passes after this Hanging up in the mid thigh area as if there was a valve issue. Never was able to get satisfactory in flow through the vein graft. The popliteal arthrotomy was extended distally and the saphenous vein anastomosis disconnected. A 3 Fogarty was passed distally to the ankle level with no thrombus in the distal vessels. Following this  attention turned to the inguinal area and a Fogarty catheter would not traverse the vein calf from above vein being in fair amount of spasm this point. It was felt definitely necessary to replace this with a synthetic Gore-Tex graft. The vein was completely excised from the anastomosis to the left common femoral artery where the Gore-Tex axillofemoral graft had been anastomosed. There was moderately good inflow from above but not excellent  A 6 mm Gore-Tex patent ring graft was delivered through an anatomic tunnel. Popliteal artery occluded proximally and distally with Vesseloops. The vein having been completely removed and the heart rod and extended slightly distally the Gore-Tex was spatulated and anastomosed end to side with 6-0 Prolene. Clamps released and this could easily be flushed with heparin saline from above. The vessels and the groin area were also occluded including the inflow from the axillobifemoral graft in the vein completely excised from this anastomosis and the Gore-Tex patch lead appropriate anastomosed in side with 6-0 Prolene. Clamps released and there was a good pulse in the Gore-Tex graft and audible Doppler flow in the posterior tibial anterior tibial arteries ankle. It was brisk and monophasic. Intraoperative arteriogram revealed 3 vessel run off primarily through the posterior tibial artery. Quality of the angiogram was not great but we were able to visualize the vessels. Following this protamine was given to reverse the heparin following adequate hemostasis the calf wound was closed with 3 layers of Vicryl in subcuticular fashion. A 10 flat Jackson-Pratt drain was brought out through an inferiorly based stab wound and left in the inguinal and. The wound was closed in multiple layers with interrupted 2-0 Vicryl for the deep layer interrupted 3-0 Vicryl for the subcutaneous and vertical mattress sutures of 3-0  nylon for the skin a sterile dressing applied patient taken to recovery in  stable condition Proc anastomosis and all debris relieveedure Details:   Josephina Gip, MD 01/10/2013 10:22 AM

## 2013-01-10 NOTE — Anesthesia Procedure Notes (Signed)
Procedure Name: Intubation Date/Time: 01/10/2013 7:56 AM Performed by: Gwenyth Allegra Pre-anesthesia Checklist: Emergency Drugs available, Patient identified, Timeout performed, Suction available and Patient being monitored Patient Re-evaluated:Patient Re-evaluated prior to inductionOxygen Delivery Method: Circle system utilized Preoxygenation: Pre-oxygenation with 100% oxygen Intubation Type: IV induction Laryngoscope Size: Mac and 4 Grade View: Grade I Tube type: Oral Tube size: 7.5 mm Number of attempts: 1 Airway Equipment and Method: Stylet Placement Confirmation: ETT inserted through vocal cords under direct vision,  breath sounds checked- equal and bilateral and positive ETCO2 Secured at: 22 cm Tube secured with: Tape Dental Injury: Teeth and Oropharynx as per pre-operative assessment

## 2013-01-10 NOTE — H&P (View-Only) (Signed)
Patient ID: Donald [name], male   DOB: 1946-02-22, 67 y.o.   MRN: [id number] Vascular Surgery Progress Note  Subjective: Occlusion of left femoral-popliteal vein graft. Patient had CT scan ordered to look at left subclavian artery stenosis proximal to left axillobifemoral graft. 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 Also noted was thrombosis of left femoral-popliteal vein graft. Do not know how long this has been thrombosed.  Objective:  Filed Vitals:   01/09/13 2059  BP: 146/57  Pulse: 84  Temp: 98.8 F (37.1 C)  Resp: 18    General alert and oriented x3 no apparent stress 2-3+ pulse in axillobifemoral graft No popliteal pulse palpable left foot is adequately perfused with intact sensation and motion   Labs:  Recent Labs Lab 01/07/13 0405 01/08/13 0510  CREATININE 1.12 1.11    Recent Labs Lab 01/06/13 1619 01/07/13 0405 01/08/13 0510  NA 139 137 135  K 4.7 4.5 3.9  CL  --  105 103  CO2  --  24 23  BUN  --  12 9  CREATININE  --  1.12 1.11  GLUCOSE 174* 159* 176*  CALCIUM  --  8.0* 8.2*    Recent Labs Lab 01/08/13 0510 01/08/13 2330 01/09/13 0510 01/09/13 2028  WBC 7.9  --  10.4 9.2  HGB 7.5* 9.9* 11.0* 10.8*  HCT 23.3* 29.6* 32.9* 32.3*  PLT 84*  --  197 197   No results found for this basename: INR,  in the last 168 hours  I/O last 3 completed shifts: In: 1765 [P.O.:1440; Blood:325] Out: 3400 [Urine:3400]  Imaging: Ct Angio Ao+bifem W/cm &/or Wo/cm  01/09/2013   *RADIOLOGY REPORT*  Clinical Data:  Evaluate axillary and subclavian arteries for planned Ax - bifem bypass  CT ANGIOGRAPHY OF CHEST, ABDOMEN AND PELVIS WITH ILIOFEMORAL RUNOFF  Technique:  Multidetector CT imaging of the chest abdomen, pelvis and lower extremities was performed using the standard protocol during bolus administration of intravenous contrast.  Multiplanar CT image reconstructions including MIPs were obtained to evaluate the vascular anatomy.  Contrast:  OMNIPAQUE IOHEXOL 350 MG/ML SOLN  *RADIOLOGY REPORT*  Comparison:  Recent CTA runoff 12/31/2012  CTA CHEST  Findings:  Mediastinum: Unremarkable CT appearance of the thyroid gland.  No suspicious mediastinal or hilar adenopathy.  No soft tissue mediastinal mass. Small hiatal hernia.  Heart/Vascular: Conventional three-vessel arch anatomy.  There is moderate (60%) narrowing at the origin of the left subclavian artery secondary to noncalcified fibrofatty plaque.  The origin of the left axillary bifemoral bypass graft is widely patent.  The visualized left axillary artery is unremarkable.  Scattered calcified and noncalcified atherosclerotic plaque throughout the thoracic aorta is somewhat irregular.  No dissection or aneurysmal dilatation.  Right subclavian approach portacatheter with the tip terminating in the distal SVC.  There is also a right IJ central venous catheter with the tip terminating in the distal SVC.  Normal- caliber pulmonary artery.  No pulmonary embolus identified.  Are within normal limits for size.  No pericardial effusion. Atherosclerotic calcifications identified in the coronary arteries.  Lungs/Pleura: Mild centrilobular emphysema.  Diffuse bronchial wall thickening suggestive of chronic bronchitis.  Mild dependent atelectasis in the lower lobes.  Small focus of nonspecific ground- glass attenuation opacity in the lingula is new and the recent interval since the prior study.  This likely represents a small focus of atelectasis.  Bones: No acute fracture or aggressive appearing lytic or blastic osseous lesion.  Review of the MIP images confirms the above  findings.  IMPRESSION:  1.  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  2.  The proximal anastomosis of the left axillary bifemoral bypass graft is widely patent.  3.  Atherosclerosis including coronary artery disease.  4.  Mild centrilobular  pulmonary emphysema.  5.  Additional ancillary findings as above.  CTA ABD/PELVIS + RUNOFF  Findings:  VASCULAR  Aorta: Irregular heterogeneous atherosclerotic plaque throughout the abdominal aorta.  There is significant narrowing in the infrarenal aorta which is mildly ectatic but not aneurysmally dilated.  No dissection or penetrating aortic ulcer.  No significant interval change compared to the recent prior study.  Celiac: Widely patent.  Conventional hepatic arterial anatomy.  SMA: Widely patent.  Renals: Single renal arteries bilaterally are patent.  There is mild atherosclerotic plaque at the origin of the left artery with minimal narrowing.  IMA: Occluded at the origin.  The vessel opacifies via retrograde flow.  Right Lower Extremity:  Heterogeneous irregular calcified and noncalcified atherosclerotic plaque throughout the right common iliac artery.  There is high-grade stenosis at the origin of the right hypogastric artery.  The right external iliac artery is diffusely diseased and nearly occluded at the inguinal ligament. Left axillary to femoral and left right fem-fem bypass graft is patent.  The right bypass graft to common femoral artery anastomosis is patent.  Distal to this, the common femoral artery demonstrates a focal moderate to high-grade narrowing without significant interval change compared to the relatively recent prior.  The right superficial femoral femoral artery is completely occluded.  Profunda remains patent.  The femoral artery reconstitutes in the abductor canal via geniculate collaterals. The popliteal artery is patent.  The runoff vessels are relatively atretic.  There is three-vessel runoff to the ankle.  Left Lower Extremity:  Chronic occlusion of the left common, internal and external iliac arteries.  Patent left axillary to femoral bypass graft.  There is a linear filling defect at the origin of the right to left fem-fem bypass graft which may represent a small amount of  nonocclusive thrombus.  New fluid and subcutaneous emphysema surrounding the distal aspect of the axis bypass graft and left to right fem-fem bypass graft anastomosis consistent with recent incision.  Interval surgical changes of left common femoral patchy angioplasty there is some irregular nonocclusive thrombus along the posterior wall of the patch angioplasty site proximal to the origin of the femoral to below the knee popliteal bypass.  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  The left femoral to below the knee popliteal artery bypass graft is patent for the first centimeter and then occludes.  The vessel does not opacify throughout the entirety of its course. Profunda femoral artery remains patent.  The popliteal artery is patent via retrograde flow from profunda branches.  The runoff vessels are atretic.  There is three-vessel runoff to the ankle.  Veins: No acute venous abnormality.  Review of the MIP images confirms the above findings.  NON-VASCULAR  No significant interval change in the abdominal or pelvic finding compared to the recent prior CT scan dated 12/31/2012.  IMPRESSION:  1.  Interval surgical changes of patchy angioplasty of the left common femoral artery and left femoral to below the knee popliteal artery saphenous vein bypass graft. There is irregular nonocclusive thrombus along the posterior wall of the common femoral artery in the region of the patchy angioplasty and the saphenous vein bypass graft is completely occluded approximately 1  cm beyond the anastomosis.  The popliteal artery reconstitutes via profunda collaterals.  There is three-vessel runoff to the ankle.  2.  Small amount of linear nonocclusive thrombus at the origin of the left to right fem-fem bypass graft may represent a small amount of nonocclusive thrombus.  No evidence of embolization to the right lower extremity.  3.  Small postoperative hematoma versus seroma in the left inguinal incision site.  Subcutaneous  emphysema is not unexpected given the recent surgical intervention.  4.  Additional ancillary findings as above.  These results were called by telephone on 01/09/2013 at 06:00 p.m. to Dr. Hart Rochester, who verbally acknowledged these results.  Signed,  Sterling Big, MD [facility] Radiology   Original Report Authenticated By: Malachy Moan, M.D.   Ct Angio Chest Aorta W/cm &/or Wo/cm  01/09/2013   *RADIOLOGY REPORT*  Clinical Data:  Evaluate axillary and subclavian arteries for planned Ax - bifem bypass  CT ANGIOGRAPHY OF CHEST, ABDOMEN AND PELVIS WITH ILIOFEMORAL RUNOFF  Technique:  Multidetector CT imaging of the chest abdomen, pelvis and lower extremities was performed using the standard protocol during bolus administration of intravenous contrast.  Multiplanar CT image reconstructions including MIPs were obtained to evaluate the vascular anatomy.  Contrast: OMNIPAQUE IOHEXOL 350 MG/ML SOLN  *RADIOLOGY REPORT*  Comparison:  Recent CTA runoff 12/31/2012  CTA CHEST  Findings:  Mediastinum: Unremarkable CT appearance of the thyroid gland.  No suspicious mediastinal or hilar adenopathy.  No soft tissue mediastinal mass. Small hiatal hernia.  Heart/Vascular: Conventional three-vessel arch anatomy.  There is moderate (60%) narrowing at the origin of the left subclavian artery secondary to noncalcified fibrofatty plaque.  The origin of the left axillary bifemoral bypass graft is widely patent.  The visualized left axillary artery is unremarkable.  Scattered calcified and noncalcified atherosclerotic plaque throughout the thoracic aorta is somewhat irregular.  No dissection or aneurysmal dilatation.  Right subclavian approach portacatheter with the tip terminating in the distal SVC.  There is also a right IJ central venous catheter with the tip terminating in the distal SVC.  Normal- caliber pulmonary artery.  No pulmonary embolus identified.  Are within normal  limits for size.  No pericardial effusion. Atherosclerotic calcifications identified in the coronary arteries.  Lungs/Pleura: Mild centrilobular emphysema.  Diffuse bronchial wall thickening suggestive of chronic bronchitis.  Mild dependent atelectasis in the lower lobes.  Small focus of nonspecific ground- glass attenuation opacity in the lingula is new and the recent interval since the prior study.  This likely represents a small focus of atelectasis.  Bones: No acute fracture or aggressive appearing lytic or blastic osseous lesion.  Review of the MIP images confirms the above findings.  IMPRESSION:  1.  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  2.  The proximal anastomosis of the left axillary bifemoral bypass graft is widely patent.  3.  Atherosclerosis including coronary artery disease.  4.  Mild centrilobular pulmonary emphysema.  5.  Additional ancillary findings as above.  CTA ABD/PELVIS + RUNOFF  Findings:  VASCULAR  Aorta: Irregular heterogeneous atherosclerotic plaque throughout the abdominal aorta.  There is significant narrowing in the infrarenal aorta which is mildly ectatic but not aneurysmally dilated.  No dissection or penetrating aortic ulcer.  No significant interval change compared to the recent prior study.  Celiac: Widely patent.  Conventional hepatic arterial anatomy.  SMA: Widely patent.  Renals: Single renal  arteries bilaterally are patent.  There is mild atherosclerotic plaque at the origin of the left artery with minimal narrowing.  IMA: Occluded at the origin.  The vessel opacifies via retrograde flow.  Right Lower Extremity:  Heterogeneous irregular calcified and noncalcified atherosclerotic plaque throughout the right common iliac artery.  There is high-grade stenosis at the origin of the right hypogastric artery.  The right external iliac artery is diffusely diseased and nearly occluded  at the inguinal ligament. Left axillary to femoral and left right fem-fem bypass graft is patent.  The right bypass graft to common femoral artery anastomosis is patent.  Distal to this, the common femoral artery demonstrates a focal moderate to high-grade narrowing without significant interval change compared to the relatively recent prior.  The right superficial femoral femoral artery is completely occluded.  Profunda remains patent.  The femoral artery reconstitutes in the abductor canal via geniculate collaterals. The popliteal artery is patent.  The runoff vessels are relatively atretic.  There is three-vessel runoff to the ankle.  Left Lower Extremity:  Chronic occlusion of the left common, internal and external iliac arteries.  Patent left axillary to femoral bypass graft.  There is a linear filling defect at the origin of the right to left fem-fem bypass graft which may represent a small amount of nonocclusive thrombus.  New fluid and subcutaneous emphysema surrounding the distal aspect of the axis bypass graft and left to right fem-fem bypass graft anastomosis consistent with recent incision.  Interval surgical changes of left common femoral patchy angioplasty there is some irregular nonocclusive thrombus along the posterior wall of the patch angioplasty site proximal to the origin of the femoral to below the knee popliteal bypass.  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  The left femoral to below the knee popliteal artery bypass graft is patent for the first centimeter and then occludes.  The vessel does not opacify throughout the entirety of its course. Profunda femoral artery remains patent.  The popliteal artery is patent via retrograde flow from profunda branches.  The runoff vessels are atretic.  There is three-vessel runoff to the ankle.  Veins: No acute venous abnormality.  Review of the MIP images confirms the above findings.  NON-VASCULAR  No significant interval change in the abdominal or  pelvic finding compared to the recent prior CT scan dated 12/31/2012.  IMPRESSION:  1.  Interval surgical changes of patchy angioplasty of the left common femoral artery and left femoral to below the knee popliteal artery saphenous vein bypass graft. There is irregular nonocclusive thrombus along the posterior wall of the common femoral artery in the region of the patchy angioplasty and the saphenous vein bypass graft is completely occluded approximately 1 cm beyond the anastomosis.  The popliteal artery reconstitutes via profunda collaterals.  There is three-vessel runoff to the ankle.  2.  Small amount of linear nonocclusive thrombus at the origin of the left to right fem-fem bypass graft may represent a small amount of nonocclusive thrombus.  No evidence of embolization to the right lower extremity.  3.  Small postoperative hematoma versus seroma in the left inguinal incision site.  Subcutaneous emphysema is not unexpected given the recent surgical intervention.  4.  Additional ancillary findings as above.  These results were called by telephone on 01/09/2013 at 06:00 p.m. to Dr. Hart Rochester, who verbally acknowledged these results.  Signed,  Sterling Big, MD [facility] Radiology   Original Report Authenticated By: Malachy Moan, M.D.  Assessment/Plan:  POD #3   LOS: 3 days  s/p Procedure(s): Femoral-Popliteal Artery Bypass INTRA OPERATIVE ARTERIOGRAM  Thrombosis of left femoral-popliteal saphenous vein graft placed 3 days ago-do not know how long this has been occluded Plan exploration in a.m. and possible replacement with Gore-Tex graft of vein not adequate Discussed with patient and family and they are in agreement   Josephina Gip, MD 01/09/2013 9:14 PM

## 2013-01-10 NOTE — Preoperative (Signed)
Beta Blockers   Reason not to administer Beta Blockers:Not Applicable 

## 2013-01-10 NOTE — Anesthesia Postprocedure Evaluation (Signed)
  Anesthesia Post-op Note  Patient: Donald [name]  Procedure(s) Performed: Procedure(s) with comments: THROMBECTOMY POSSIBLE BYPASS GRAFT FEMORAL-POPLITEAL ARTERY (Left) - Attempted Thrombectomy of left Femoral/Popliteal graft - Unsuccessful.  Insertion of Left femoral to below the knee popliteal propaten graft. ANGIOGRAM EXTREMITY LEFT (Left) - angiogram of left lower extrimity.  Patient Location: PACU  Anesthesia Type:General  Level of Consciousness: awake, alert , oriented and patient cooperative  Airway and Oxygen Therapy: Patient Spontanous Breathing and Patient connected to nasal cannula oxygen  Post-op Pain: none  Post-op Assessment: Post-op Vital signs reviewed, Patient's Cardiovascular Status Stable, Respiratory Function Stable, Patent Airway, No signs of Nausea or vomiting and Pain level controlled  Post-op Vital Signs: Reviewed and stable  Complications: No apparent anesthesia complications

## 2013-01-11 ENCOUNTER — Inpatient Hospital Stay (HOSPITAL_COMMUNITY): Payer: Medicare Other

## 2013-01-11 LAB — BASIC METABOLIC PANEL
BUN: 10 mg/dL (ref 6–23)
Creatinine, Ser: 1.05 mg/dL (ref 0.50–1.35)
GFR calc Af Amer: 82 mL/min — ABNORMAL LOW (ref >90)
GFR calc non Af Amer: 71 mL/min — ABNORMAL LOW (ref >90)
Potassium: 3.8 mEq/L (ref 3.5–5.1)

## 2013-01-11 LAB — CBC
HCT: 24.7 % — ABNORMAL LOW (ref 39.0–52.0)
MCHC: 32 g/dL (ref 30.0–36.0)
MCV: 85.5 fL (ref 78.0–100.0)
RDW: 16.6 % — ABNORMAL HIGH (ref 11.5–15.5)

## 2013-01-11 LAB — GLUCOSE, CAPILLARY: Glucose-Capillary: 155 mg/dL — ABNORMAL HIGH (ref 70–99)

## 2013-01-11 NOTE — Progress Notes (Signed)
01/11/13 0348  Vitals  BP ! 85/42 mmHg  500 cc bolus given per standing order

## 2013-01-11 NOTE — Progress Notes (Signed)
Patient ID: Donald [name], male   DOB: 1946-02-20, 67 y.o.   MRN: [id number] Vascular Surgery Progress Note  Subjective: One-day post redo left femoral-popliteal bypass graft with 6 mm Gore-Tex following thrombosis of left femoral-popliteal saphenous vein graft. Patient complains primarily of pain in left first toe where ulceration is noted and left heel. No other specific complaints.  Objective:  Filed Vitals:   01/11/13 0652  BP: 120/69  Pulse: 78  Temp: 98.6 F (37 C)  Resp: 21    General alert and oriented x3 in no apparent distress Left inguinal wound and popliteal wound seem to be healing satisfactorily. JP drainage in the left inguinal wound is serous and minimal-will DC drain in a.m. Loud biphasic flow in left posterior tibial artery with left foot pink and well perfused   Labs:  Recent Labs Lab 01/09/13 2028 01/10/13 1100 01/11/13 0400  CREATININE 1.02 1.10 1.05    Recent Labs Lab 01/08/13 0510 01/09/13 2028 01/10/13 0824 01/10/13 1100 01/11/13 0400  NA 135 132* 134*  --  136  K 3.9 3.9 5.8*  --  3.8  CL 103 98  --   --  104  CO2 23 22  --   --  23  BUN 9 8  --   --  10  CREATININE 1.11 1.02  --  1.10 1.05  GLUCOSE 176* 158* 175*  --  178*  CALCIUM 8.2* 8.7  --   --  7.7*    Recent Labs Lab 01/09/13 2028 01/10/13 0824 01/10/13 1100 01/11/13 0400  WBC 9.2  --  8.2 7.1  HGB 10.8* 10.2* 9.9* 7.9*  HCT 32.3* 30.0* 30.8* 24.7*  PLT 197  --  206 200   No results found for this basename: INR,  in the last 168 hours  I/O last 3 completed shifts: In: 3946 [P.O.:360; I.V.:3286; IV Piggyback:300] Out: 2425 [Urine:2305; Drains:45; Blood:75]  Imaging: Ct Angio Ao+bifem W/cm &/or Wo/cm  01/09/2013   *RADIOLOGY REPORT*  Clinical Data:  Evaluate axillary and subclavian arteries for planned Ax - bifem bypass  CT ANGIOGRAPHY OF CHEST, ABDOMEN AND PELVIS WITH ILIOFEMORAL RUNOFF  Technique:  Multidetector CT imaging of the chest abdomen, pelvis and lower  extremities was performed using the standard protocol during bolus administration of intravenous contrast.  Multiplanar CT image reconstructions including MIPs were obtained to evaluate the vascular anatomy.  Contrast: OMNIPAQUE IOHEXOL 350 MG/ML SOLN  *RADIOLOGY REPORT*  Comparison:  Recent CTA runoff 12/31/2012  CTA CHEST  Findings:  Mediastinum: Unremarkable CT appearance of the thyroid gland.  No suspicious mediastinal or hilar adenopathy.  No soft tissue mediastinal mass. Small hiatal hernia.  Heart/Vascular: Conventional three-vessel arch anatomy.  There is moderate (60%) narrowing at the origin of the left subclavian artery secondary to noncalcified fibrofatty plaque.  The origin of the left axillary bifemoral bypass graft is widely patent.  The visualized left axillary artery is unremarkable.  Scattered calcified and noncalcified atherosclerotic plaque throughout the thoracic aorta is somewhat irregular.  No dissection or aneurysmal dilatation.  Right subclavian approach portacatheter with the tip terminating in the distal SVC.  There is also a right IJ central venous catheter with the tip terminating in the distal SVC.  Normal- caliber pulmonary artery.  No pulmonary embolus identified.  Are within normal limits for size.  No pericardial effusion. Atherosclerotic calcifications identified in the coronary arteries.  Lungs/Pleura: Mild centrilobular emphysema.  Diffuse bronchial wall thickening suggestive of chronic bronchitis.  Mild dependent atelectasis in the  lower lobes.  Small focus of nonspecific ground- glass attenuation opacity in the lingula is new and the recent interval since the prior study.  This likely represents a small focus of atelectasis.  Bones: No acute fracture or aggressive appearing lytic or blastic osseous lesion.  Review of the MIP images confirms the above findings.  IMPRESSION:  1.  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  2.  The proximal anastomosis of the left axillary bifemoral bypass graft is widely patent.  3.  Atherosclerosis including coronary artery disease.  4.  Mild centrilobular pulmonary emphysema.  5.  Additional ancillary findings as above.  CTA ABD/PELVIS + RUNOFF  Findings:  VASCULAR  Aorta: Irregular heterogeneous atherosclerotic plaque throughout the abdominal aorta.  There is significant narrowing in the infrarenal aorta which is mildly ectatic but not aneurysmally dilated.  No dissection or penetrating aortic ulcer.  No significant interval change compared to the recent prior study.  Celiac: Widely patent.  Conventional hepatic arterial anatomy.  SMA: Widely patent.  Renals: Single renal arteries bilaterally are patent.  There is mild atherosclerotic plaque at the origin of the left artery with minimal narrowing.  IMA: Occluded at the origin.  The vessel opacifies via retrograde flow.  Right Lower Extremity:  Heterogeneous irregular calcified and noncalcified atherosclerotic plaque throughout the right common iliac artery.  There is high-grade stenosis at the origin of the right hypogastric artery.  The right external iliac artery is diffusely diseased and nearly occluded at the inguinal ligament. Left axillary to femoral and left right fem-fem bypass graft is patent.  The right bypass graft to common femoral artery anastomosis is patent.  Distal to this, the common femoral artery demonstrates a focal moderate to high-grade narrowing without significant interval change compared to the relatively recent prior.  The right superficial femoral femoral artery is completely occluded.  Profunda remains patent.  The femoral artery reconstitutes in the abductor canal via geniculate collaterals. The popliteal artery is patent.  The runoff vessels are relatively atretic.  There is three-vessel runoff to the ankle.  Left Lower Extremity:  Chronic occlusion of the left common,  internal and external iliac arteries.  Patent left axillary to femoral bypass graft.  There is a linear filling defect at the origin of the right to left fem-fem bypass graft which may represent a small amount of nonocclusive thrombus.  New fluid and subcutaneous emphysema surrounding the distal aspect of the axis bypass graft and left to right fem-fem bypass graft anastomosis consistent with recent incision.  Interval surgical changes of left common femoral patchy angioplasty there is some irregular nonocclusive thrombus along the posterior wall of the patch angioplasty site proximal to the origin of the femoral to below the knee popliteal bypass.  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  The left femoral to below the knee popliteal artery bypass graft is patent for the first centimeter and then occludes.  The vessel does not opacify throughout the entirety of its course. Profunda femoral artery remains patent.  The popliteal artery is patent via retrograde flow from profunda branches.  The runoff vessels are atretic.  There is three-vessel runoff to the ankle.  Veins: No acute venous abnormality.  Review of the MIP images confirms the above findings.  NON-VASCULAR  No significant interval change in the abdominal or pelvic finding compared to the recent prior CT scan dated 12/31/2012.  IMPRESSION:  1.  Interval surgical  changes of patchy angioplasty of the left common femoral artery and left femoral to below the knee popliteal artery saphenous vein bypass graft. There is irregular nonocclusive thrombus along the posterior wall of the common femoral artery in the region of the patchy angioplasty and the saphenous vein bypass graft is completely occluded approximately 1 cm beyond the anastomosis.  The popliteal artery reconstitutes via profunda collaterals.  There is three-vessel runoff to the ankle.  2.  Small amount of linear nonocclusive thrombus at the origin of the left to right fem-fem bypass graft may  represent a small amount of nonocclusive thrombus.  No evidence of embolization to the right lower extremity.  3.  Small postoperative hematoma versus seroma in the left inguinal incision site.  Subcutaneous emphysema is not unexpected given the recent surgical intervention.  4.  Additional ancillary findings as above.  These results were called by telephone on 01/09/2013 at 06:00 p.m. to Dr. Hart Rochester, who verbally acknowledged these results.  Signed,  Sterling Big, MD [facility]   Original Report Authenticated By: Malachy Moan, M.D.   Ct Angio Chest Aorta W/cm &/or Wo/cm  01/09/2013   *RADIOLOGY REPORT*  Clinical Data:  Evaluate axillary and subclavian arteries for planned Ax - bifem bypass  CT ANGIOGRAPHY OF CHEST, ABDOMEN AND PELVIS WITH ILIOFEMORAL RUNOFF  Technique:  Multidetector CT imaging of the chest abdomen, pelvis and lower extremities was performed using the standard protocol during bolus administration of intravenous contrast.  Multiplanar CT image reconstructions including MIPs were obtained to evaluate the vascular anatomy.  Contrast: OMNIPAQUE IOHEXOL 350 MG/ML SOLN  *RADIOLOGY REPORT*  Comparison:  Recent CTA runoff 12/31/2012  CTA CHEST  Findings:  Mediastinum: Unremarkable CT appearance of the thyroid gland.  No suspicious mediastinal or hilar adenopathy.  No soft tissue mediastinal mass. Small hiatal hernia.  Heart/Vascular: Conventional three-vessel arch anatomy.  There is moderate (60%) narrowing at the origin of the left subclavian artery secondary to noncalcified fibrofatty plaque.  The origin of the left axillary bifemoral bypass graft is widely patent.  The visualized left axillary artery is unremarkable.  Scattered calcified and noncalcified atherosclerotic plaque throughout the thoracic aorta is somewhat irregular.  No dissection or aneurysmal dilatation.  Right subclavian approach portacatheter with the tip  terminating in the distal SVC.  There is also a right IJ central venous catheter with the tip terminating in the distal SVC.  Normal- caliber pulmonary artery.  No pulmonary embolus identified.  Are within normal limits for size.  No pericardial effusion. Atherosclerotic calcifications identified in the coronary arteries.  Lungs/Pleura: Mild centrilobular emphysema.  Diffuse bronchial wall thickening suggestive of chronic bronchitis.  Mild dependent atelectasis in the lower lobes.  Small focus of nonspecific ground- glass attenuation opacity in the lingula is new and the recent interval since the prior study.  This likely represents a small focus of atelectasis.  Bones: No acute fracture or aggressive appearing lytic or blastic osseous lesion.  Review of the MIP images confirms the above findings.  IMPRESSION:  1.  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  2.  The proximal anastomosis of the left axillary bifemoral bypass graft is widely patent.  3.  Atherosclerosis including coronary artery disease.  4.  Mild centrilobular pulmonary emphysema.  5.  Additional ancillary findings as above.  CTA ABD/PELVIS + RUNOFF  Findings:  VASCULAR  Aorta: Irregular heterogeneous atherosclerotic plaque throughout  the abdominal aorta.  There is significant narrowing in the infrarenal aorta which is mildly ectatic but not aneurysmally dilated.  No dissection or penetrating aortic ulcer.  No significant interval change compared to the recent prior study.  Celiac: Widely patent.  Conventional hepatic arterial anatomy.  SMA: Widely patent.  Renals: Single renal arteries bilaterally are patent.  There is mild atherosclerotic plaque at the origin of the left artery with minimal narrowing.  IMA: Occluded at the origin.  The vessel opacifies via retrograde flow.  Right Lower Extremity:  Heterogeneous irregular calcified and noncalcified  atherosclerotic plaque throughout the right common iliac artery.  There is high-grade stenosis at the origin of the right hypogastric artery.  The right external iliac artery is diffusely diseased and nearly occluded at the inguinal ligament. Left axillary to femoral and left right fem-fem bypass graft is patent.  The right bypass graft to common femoral artery anastomosis is patent.  Distal to this, the common femoral artery demonstrates a focal moderate to high-grade narrowing without significant interval change compared to the relatively recent prior.  The right superficial femoral femoral artery is completely occluded.  Profunda remains patent.  The femoral artery reconstitutes in the abductor canal via geniculate collaterals. The popliteal artery is patent.  The runoff vessels are relatively atretic.  There is three-vessel runoff to the ankle.  Left Lower Extremity:  Chronic occlusion of the left common, internal and external iliac arteries.  Patent left axillary to femoral bypass graft.  There is a linear filling defect at the origin of the right to left fem-fem bypass graft which may represent a small amount of nonocclusive thrombus.  New fluid and subcutaneous emphysema surrounding the distal aspect of the axis bypass graft and left to right fem-fem bypass graft anastomosis consistent with recent incision.  Interval surgical changes of left common femoral patchy angioplasty there is some irregular nonocclusive thrombus along the posterior wall of the patch angioplasty site proximal to the origin of the femoral to below the knee popliteal bypass.  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  The left femoral to below the knee popliteal artery bypass graft is patent for the first centimeter and then occludes.  The vessel does not opacify throughout the entirety of its course. Profunda femoral artery remains patent.  The popliteal artery is patent via retrograde flow from profunda branches.  The runoff vessels  are atretic.  There is three-vessel runoff to the ankle.  Veins: No acute venous abnormality.  Review of the MIP images confirms the above findings.  NON-VASCULAR  No significant interval change in the abdominal or pelvic finding compared to the recent prior CT scan dated 12/31/2012.  IMPRESSION:  1.  Interval surgical changes of patchy angioplasty of the left common femoral artery and left femoral to below the knee popliteal artery saphenous vein bypass graft. There is irregular nonocclusive thrombus along the posterior wall of the common femoral artery in the region of the patchy angioplasty and the saphenous vein bypass graft is completely occluded approximately 1 cm beyond the anastomosis.  The popliteal artery reconstitutes via profunda collaterals.  There is three-vessel runoff to the ankle.  2.  Small amount of linear nonocclusive thrombus at the origin of the left to right fem-fem bypass graft may represent a small amount of nonocclusive thrombus.  No evidence of embolization to the right lower extremity.  3.  Small postoperative hematoma versus seroma in the left inguinal incision site.  Subcutaneous emphysema is not unexpected given the  recent surgical intervention.  4.  Additional ancillary findings as above.  These results were called by telephone on 01/09/2013 at 06:00 p.m. to Dr. Hart Rochester, who verbally acknowledged these results.  Signed,  Sterling Big, MD [facility]   Original Report Authenticated By: Malachy Moan, M.D.    Assessment/Plan:  POD #1   LOS: 5 days  s/p Procedure(s): Attempted thrombectomy left femoral-popliteal saphenous vein graft-unsuccessful Insertion new left femoral-popliteal Gore-Tex graft ANGIOGRAM EXTREMITY LEFT  Doing well post redo left femoral-popliteal Gore-Tex graft yesterday We'll transfer 2000 today Hematocrit is 24% will leave decision regarding transfusion up to Dr. fields in a.m. Patient has  been hemodynamically stable. Will DC heparin drip and continue Lovenox   Josephina Gip, MD 01/11/2013 9:03 AM

## 2013-01-11 NOTE — Progress Notes (Signed)
01/11/13 0405  Vitals  BP ! 91/43 mmHg  after 500cc bolus 2nd 500 cc bolus per MD order pt denies symptoms nausea has resolved

## 2013-01-12 ENCOUNTER — Inpatient Hospital Stay (HOSPITAL_COMMUNITY): Payer: Medicare Other | Admitting: Physical Therapy

## 2013-01-12 ENCOUNTER — Inpatient Hospital Stay (HOSPITAL_COMMUNITY): Payer: Medicare Other | Admitting: Occupational Therapy

## 2013-01-12 ENCOUNTER — Inpatient Hospital Stay (HOSPITAL_COMMUNITY): Payer: Medicare Other

## 2013-01-12 DIAGNOSIS — Z48812 Encounter for surgical aftercare following surgery on the circulatory system: Secondary | ICD-10-CM

## 2013-01-12 LAB — CBC WITH DIFFERENTIAL/PLATELET
Basophils Absolute: 0 10*3/uL (ref 0.0–0.1)
Basophils Relative: 0 % (ref 0–1)
Eosinophils Relative: 4 % (ref 0–5)
Lymphocytes Relative: 19 % (ref 12–46)
Neutro Abs: 3.3 10*3/uL (ref 1.7–7.7)
Platelets: 204 10*3/uL (ref 150–400)
RDW: 16.8 % — ABNORMAL HIGH (ref 11.5–15.5)
WBC: 5.1 10*3/uL (ref 4.0–10.5)

## 2013-01-12 LAB — COMPREHENSIVE METABOLIC PANEL
Alkaline Phosphatase: 63 U/L (ref 39–117)
BUN: 9 mg/dL (ref 6–23)
CO2: 24 mEq/L (ref 19–32)
Chloride: 104 mEq/L (ref 96–112)
GFR calc Af Amer: 82 mL/min — ABNORMAL LOW (ref >90)
Glucose, Bld: 148 mg/dL — ABNORMAL HIGH (ref 70–99)
Potassium: 3.7 mEq/L (ref 3.5–5.1)
Total Bilirubin: 0.2 mg/dL — ABNORMAL LOW (ref 0.3–1.2)

## 2013-01-12 LAB — WOUND CULTURE: Culture: NO GROWTH

## 2013-01-12 LAB — GLUCOSE, CAPILLARY
Glucose-Capillary: 196 mg/dL — ABNORMAL HIGH (ref 70–99)
Glucose-Capillary: 110 mg/dL — ABNORMAL HIGH (ref 70–99)

## 2013-01-12 LAB — APTT: aPTT: 28 seconds (ref 24–37)

## 2013-01-12 MED ORDER — SENNOSIDES-DOCUSATE SODIUM 8.6-50 MG PO TABS
2.0000 | ORAL_TABLET | Freq: Every day | ORAL | Status: DC
  Administered 2013-01-12: 2 via ORAL
  Filled 2013-01-12 (×2): qty 2

## 2013-01-12 NOTE — Progress Notes (Signed)
VASCULAR LAB PRELIMINARY  ARTERIAL  ABI completed:    RIGHT    LEFT    PRESSURE WAVEFORM  PRESSURE WAVEFORM  BRACHIAL 145 Triphasic BRACHIAL 112 Biphasic  DP 57 Monophasic DP 85 Monophasic  AT   AT    PT  Unable to insonate PT 119 Monophasic  PER   PER    GREAT TOE  NA GREAT TOE  NA    RIGHT LEFT  ABI 0.39 0.82    The right ABI is suggestive of severe arterial insufficiency. The left ABI is suggestive of mild arterial insuffiency.  01/12/2013 10:26 AM Gertie Fey, RVT, RDCS, RDMS

## 2013-01-12 NOTE — Progress Notes (Signed)
Received to unit 2W. Assessed per flow sheet. No changed noted in assessment. Left leg dressings and incisions intact. Assisted to bed side chair. Call bell and family near.Donald [name]

## 2013-01-12 NOTE — Progress Notes (Signed)
Occupational Therapy Treatment Patient Details Name: Donald [name] MRN: [id number] DOB: Oct 30, 1945 Today's Date: 01/12/2013 Time: 9629-5284 OT Time Calculation (min): 16 min  OT Assessment / Plan / Recommendation  History of present illness Pt admit for left fem pop BPG.  Re-do of fem-pop 9/13.    OT comments  Pt making progress  Follow Up Recommendations  CIR       Equipment Recommendations   (TBD next venue)    Recommendations for Other Services Rehab consult  Frequency Min 2X/week   Progress towards OT Goals Progress towards OT goals: Progressing toward goals  Plan Discharge plan remains appropriate    Precautions / Restrictions Precautions Precautions: Fall Precaution Comments: impulsive Restrictions Weight Bearing Restrictions: No   Pertinent Vitals/Pain 6/10 FACES; repositioned and pre-medicated    ADL  Lower Body Dressing: Maximal assistance (due to pain in LLE even when attempting RLE) Where Assessed - Lower Body Dressing: Unsupported sitting Toilet Transfer: Minimal assistance Toilet Transfer Method: Sit to stand Toilet Transfer Equipment:  (Recliner> out door and down hallway>recliner behind him) Equipment Used: Gait belt;Rolling walker (LUE walker splint) Transfers/Ambulation Related to ADLs: min A sit<>stand; total A +2 (pt=80%) ambulation     OT Goals(current goals can now be found in the care plan section)    Visit Information  Last OT Received On: 01/12/13 Assistance Needed: +2 PT/OT Co-Evaluation/Treatment: Yes History of Present Illness: Pt admit for left fem pop BPG.  Re-do of fem-pop 9/13.          Cognition  Cognition Arousal/Alertness: Awake/alert Behavior During Therapy: WFL for tasks assessed/performed Overall Cognitive Status: Within Functional Limits for tasks assessed    Mobility  Bed Mobility Details for Bed Mobility Assistance: pt in chair on arrival. Transfers Transfers: Sit to Stand;Stand to Sit Sit to Stand: 4: Min  assist;With upper extremity assist;With armrests;From chair/3-in-1 Stand to Sit: 4: Min assist;With upper extremity assist;With armrests;To chair/3-in-1 Details for Transfer Assistance: cues for hand placement; pt impulsive and thus unsteady with sit<>stand          End of Session OT - End of Session Equipment Utilized During Treatment: Gait belt;Rolling walker Activity Tolerance: Patient tolerated treatment well Patient left: in chair;with call bell/phone within reach       Evette Georges [phone] 01/12/2013, 2:43 PM

## 2013-01-12 NOTE — Progress Notes (Signed)
Utilization review completed.  

## 2013-01-12 NOTE — Progress Notes (Addendum)
  VASCULAR SURGERY BYPASS PROGRESS NOTE   2 Days Post-Op #1 attempted thrombectomy left femoral-popliteal saphenous vein graft-unsuccessful  #2 insertion of new left common femoral to popliteal (below knee) bypass using a 6 mm Gore-Tex-propaten graft #3 intraoperative arteriogram left leg   SUBJECTIVE: Pt states left groin sore but spasms in left foot are gone. States left foot feel good, sensitive to touch  PHYSICAL EXAM: BP Readings from Last 3 Encounters:  01/12/13 94/54  01/12/13 94/54  01/12/13 94/54   Temp Readings from Last 3 Encounters:  01/12/13 98.2 F (36.8 C) Oral  01/12/13 98.2 F (36.8 C) Oral  01/12/13 98.2 F (36.8 C) Oral   Pulse Readings from Last 3 Encounters:  01/12/13 80  01/12/13 80  01/12/13 80   SpO2 Readings from Last 3 Encounters:  01/12/13 98%  01/12/13 98%  01/12/13 98%     Intake/Output Summary (Last 24 hours) at 01/12/13 1610 Last data filed at 01/12/13 0354  Gross per 24 hour  Intake    600 ml  Output   1005 ml  Net   -405 ml    Extremities: Incisions clean, dry and intact DP/PT monophasic by doppler Tip of 1st and second toes demarcating Mod edema left foot  LABS: Lab Results  Component Value Date   WBC 5.1 01/12/2013   HGB 8.0* 01/12/2013   HCT 25.1* 01/12/2013   MCV 87.5 01/12/2013   PLT 204 01/12/2013   Lab Results  Component Value Date   CREATININE 1.05 01/12/2013   Lab Results  Component Value Date   INR 1.06 01/02/2013   ASSESSMENT:  2 Days Post-Op s/p redo fem -pop with graft with healing incisions  Post-op acute blood loss anemia - stable, asymptomatic  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

## 2013-01-12 NOTE — Progress Notes (Addendum)
Physical Therapy Treatment Patient Details Name: Donald [name] MRN: [id number] DOB: 1945/06/11 Today's Date: 01/12/2013 Time: 4401-0272 PT Time Calculation (min): 17 min  PT Assessment / Plan / Recommendation  History of Present Illness Pt admit for left fem pop BPG with redo of left fem pop 9/13.     PT Comments   Pt admitted with left fem pop BPG 9/9 with redo of left  Fem pop on 9/13. Pt currently with functional limitations due to continued impulsivity decreasing safety as well as pain in left LE hindering mobility. Continue toward goals set on 9/10.   Pt will benefit from skilled PT to increase their independence and safety with mobility to allow discharge to the venue listed below.   Follow Up Recommendations  Supervision/Assistance - 24 hour;CIR                 Equipment Recommendations  None recommended by PT    Recommendations for Other Services Rehab consult  Frequency Min 3X/week   Progress towards PT Goals Progress towards PT goals: Progressing toward goals  Plan Current plan remains appropriate    Precautions / Restrictions Precautions Precautions: Fall Precaution Comments: impulsive Restrictions Weight Bearing Restrictions: No   Pertinent Vitals/Pain VSS, Some pain left LE per pt and pt had meds.    Mobility  Bed Mobility Bed Mobility: Not assessed Details for Bed Mobility Assistance: pt in chair on arrival. Transfers Transfers: Sit to Stand;Stand to Sit Sit to Stand: 4: Min assist;With upper extremity assist;With armrests;From chair/3-in-1 Stand to Sit: 4: Min assist;With upper extremity assist;With armrests;To chair/3-in-1 Details for Transfer Assistance: cues for hand placement; pt impulsive and thus unsteady with sit<>stand Ambulation/Gait Ambulation/Gait Assistance: 1: +2 Total assist Ambulation/Gait: Patient Percentage: 80% Ambulation Distance (Feet): 50 Feet Assistive device: Rolling walker Ambulation/Gait Assistance Details: Pt ambulated  with RW with left UE handpiece on.  Needed +2 assist for chair follow as well as needed cues and assist to sequence steps and RW as well as to hold left UE onto hand grip.  Gait deviations apparent however pt states his gait is close to baseline.  Pt unsteady with continued poor quality of movement of left LE with ambulation.   Gait Pattern: Step-to pattern;Decreased step length - left;Decreased weight shift to left;Shuffle;Trunk flexed;Wide base of support Gait velocity: decreased Stairs: No Wheelchair Mobility Wheelchair Mobility: No     PT Goals (current goals can now be found in the care plan section)    Visit Information  Last PT Received On: 01/12/13 Assistance Needed: +2 PT/OT Co-Evaluation/Treatment: Yes History of Present Illness: Pt admit for left fem pop BPG.      Subjective Data  Subjective: "I want to do my therapy at home if I can."   Cognition  Cognition Arousal/Alertness: Awake/alert Behavior During Therapy: WFL for tasks assessed/performed Overall Cognitive Status: Within Functional Limits for tasks assessed    Balance  Balance Balance Assessed: Yes Static Standing Balance Static Standing - Balance Support: Bilateral upper extremity supported;During functional activity Static Standing - Level of Assistance: 4: Min assist Static Standing - Comment/# of Minutes: 2  End of Session PT - End of Session Equipment Utilized During Treatment: Gait belt Activity Tolerance: Patient limited by pain Patient left: in chair;with call bell/phone within reach Nurse Communication: Mobility status       INGOLD,Juaquin [place] 01/12/2013, 2:59 PM [facility] Acute Rehabilitation [phone] (pager)

## 2013-01-13 ENCOUNTER — Encounter (HOSPITAL_COMMUNITY): Payer: Self-pay | Admitting: Vascular Surgery

## 2013-01-13 ENCOUNTER — Inpatient Hospital Stay (HOSPITAL_COMMUNITY)
Admission: RE | Admit: 2013-01-13 | Discharge: 2013-01-21 | DRG: 945 | Disposition: A | Discharge status: Other Acute Inpatient Hospital | Payer: Medicare Other | Source: Intra-hospital | Attending: Physical Medicine & Rehabilitation | Admitting: Physical Medicine & Rehabilitation

## 2013-01-13 DIAGNOSIS — I69959 Hemiplegia and hemiparesis following unspecified cerebrovascular disease affecting unspecified side: Secondary | ICD-10-CM

## 2013-01-13 DIAGNOSIS — E1142 Type 2 diabetes mellitus with diabetic polyneuropathy: Secondary | ICD-10-CM | POA: Diagnosis present

## 2013-01-13 DIAGNOSIS — F17200 Nicotine dependence, unspecified, uncomplicated: Secondary | ICD-10-CM | POA: Diagnosis present

## 2013-01-13 DIAGNOSIS — R5381 Other malaise: Secondary | ICD-10-CM | POA: Diagnosis present

## 2013-01-13 DIAGNOSIS — Z5189 Encounter for other specified aftercare: Principal | ICD-10-CM

## 2013-01-13 DIAGNOSIS — F329 Major depressive disorder, single episode, unspecified: Secondary | ICD-10-CM | POA: Diagnosis present

## 2013-01-13 DIAGNOSIS — K5900 Constipation, unspecified: Secondary | ICD-10-CM | POA: Diagnosis not present

## 2013-01-13 DIAGNOSIS — E785 Hyperlipidemia, unspecified: Secondary | ICD-10-CM | POA: Diagnosis present

## 2013-01-13 DIAGNOSIS — M109 Gout, unspecified: Secondary | ICD-10-CM | POA: Diagnosis present

## 2013-01-13 DIAGNOSIS — D62 Acute posthemorrhagic anemia: Secondary | ICD-10-CM | POA: Diagnosis present

## 2013-01-13 DIAGNOSIS — I2510 Atherosclerotic heart disease of native coronary artery without angina pectoris: Secondary | ICD-10-CM | POA: Diagnosis present

## 2013-01-13 DIAGNOSIS — K219 Gastro-esophageal reflux disease without esophagitis: Secondary | ICD-10-CM | POA: Diagnosis present

## 2013-01-13 DIAGNOSIS — I10 Essential (primary) hypertension: Secondary | ICD-10-CM | POA: Diagnosis present

## 2013-01-13 DIAGNOSIS — F3289 Other specified depressive episodes: Secondary | ICD-10-CM | POA: Diagnosis present

## 2013-01-13 DIAGNOSIS — I739 Peripheral vascular disease, unspecified: Secondary | ICD-10-CM

## 2013-01-13 DIAGNOSIS — G8110 Spastic hemiplegia affecting unspecified side: Secondary | ICD-10-CM

## 2013-01-13 DIAGNOSIS — I70209 Unspecified atherosclerosis of native arteries of extremities, unspecified extremity: Secondary | ICD-10-CM | POA: Diagnosis present

## 2013-01-13 DIAGNOSIS — E1149 Type 2 diabetes mellitus with other diabetic neurological complication: Secondary | ICD-10-CM | POA: Diagnosis present

## 2013-01-13 LAB — CBC
MCHC: 32.8 g/dL (ref 30.0–36.0)
Platelets: 264 10*3/uL (ref 150–400)
RDW: 16.4 % — ABNORMAL HIGH (ref 11.5–15.5)
WBC: 7.4 10*3/uL (ref 4.0–10.5)

## 2013-01-13 LAB — CREATININE, SERUM
Creatinine, Ser: 1.14 mg/dL (ref 0.50–1.35)
GFR calc Af Amer: 74 mL/min — ABNORMAL LOW (ref >90)
GFR calc non Af Amer: 64 mL/min — ABNORMAL LOW (ref >90)

## 2013-01-13 LAB — GLUCOSE, CAPILLARY: Glucose-Capillary: 160 mg/dL — ABNORMAL HIGH (ref 70–99)

## 2013-01-13 MED ORDER — ACETAMINOPHEN 325 MG PO TABS
325.0000 mg | ORAL_TABLET | ORAL | Status: DC | PRN
  Administered 2013-01-13: 650 mg via ORAL
  Filled 2013-01-13 (×2): qty 2

## 2013-01-13 MED ORDER — SENNOSIDES-DOCUSATE SODIUM 8.6-50 MG PO TABS
2.0000 | ORAL_TABLET | Freq: Every day | ORAL | Status: DC
  Administered 2013-01-13 – 2013-01-19 (×2): 2 via ORAL
  Filled 2013-01-13 (×5): qty 2

## 2013-01-13 MED ORDER — RISAQUAD PO CAPS
1.0000 | ORAL_CAPSULE | Freq: Every day | ORAL | Status: DC
  Administered 2013-01-14 – 2013-01-21 (×8): 1 via ORAL
  Filled 2013-01-13 (×9): qty 1

## 2013-01-13 MED ORDER — BISACODYL 10 MG RE SUPP: 10.0000 mg | Freq: Every day | RECTAL | Status: DC | PRN

## 2013-01-13 MED ORDER — ASPIRIN-DIPYRIDAMOLE ER 25-200 MG PO CP12
1.0000 | ORAL_CAPSULE | Freq: Two times a day (BID) | ORAL | Status: DC
  Administered 2013-01-13 – 2013-01-21 (×16): 1 via ORAL
  Filled 2013-01-13 (×18): qty 1

## 2013-01-13 MED ORDER — BUPROPION HCL ER (SMOKING DET) 150 MG PO TB12
150.0000 mg | ORAL_TABLET | Freq: Every day | ORAL | Status: DC
  Administered 2013-01-14 – 2013-01-21 (×8): 150 mg via ORAL
  Filled 2013-01-13 (×10): qty 1

## 2013-01-13 MED ORDER — OMEGA-3-ACID ETHYL ESTERS 1 G PO CAPS
2.0000 g | ORAL_CAPSULE | Freq: Two times a day (BID) | ORAL | Status: DC
  Administered 2013-01-13 – 2013-01-21 (×16): 2 g via ORAL
  Filled 2013-01-13 (×18): qty 2

## 2013-01-13 MED ORDER — OXYCODONE-ACETAMINOPHEN 5-325 MG PO TABS
1.0000 | ORAL_TABLET | ORAL | Status: DC | PRN
  Administered 2013-01-14 – 2013-01-15 (×4): 1 via ORAL
  Administered 2013-01-16 (×2): 2 via ORAL
  Administered 2013-01-16: 1 via ORAL
  Administered 2013-01-17 – 2013-01-21 (×10): 2 via ORAL
  Filled 2013-01-13: qty 1
  Filled 2013-01-13: qty 2
  Filled 2013-01-13: qty 1
  Filled 2013-01-13 (×4): qty 2
  Filled 2013-01-13: qty 1
  Filled 2013-01-13: qty 2
  Filled 2013-01-13: qty 1
  Filled 2013-01-13 (×2): qty 2
  Filled 2013-01-13: qty 1
  Filled 2013-01-13 (×4): qty 2

## 2013-01-13 MED ORDER — ONDANSETRON HCL 4 MG PO TABS
4.0000 mg | ORAL_TABLET | Freq: Four times a day (QID) | ORAL | Status: DC | PRN
  Administered 2013-01-15: 4 mg via ORAL
  Filled 2013-01-13: qty 1

## 2013-01-13 MED ORDER — SODIUM CHLORIDE 0.9 % IJ SOLN
10.0000 mL | Freq: Two times a day (BID) | INTRAMUSCULAR | Status: DC
  Administered 2013-01-17: 10 mL

## 2013-01-13 MED ORDER — PANTOPRAZOLE SODIUM 40 MG PO TBEC
40.0000 mg | DELAYED_RELEASE_TABLET | Freq: Every day | ORAL | Status: DC
  Administered 2013-01-14 – 2013-01-21 (×8): 40 mg via ORAL
  Filled 2013-01-13 (×6): qty 1

## 2013-01-13 MED ORDER — ENOXAPARIN SODIUM 30 MG/0.3ML ~~LOC~~ SOLN
30.0000 mg | SUBCUTANEOUS | Status: DC
  Administered 2013-01-14 – 2013-01-19 (×6): 30 mg via SUBCUTANEOUS
  Filled 2013-01-13 (×7): qty 0.3

## 2013-01-13 MED ORDER — GABAPENTIN 300 MG PO CAPS
300.0000 mg | ORAL_CAPSULE | Freq: Three times a day (TID) | ORAL | Status: DC
  Administered 2013-01-13 – 2013-01-21 (×24): 300 mg via ORAL
  Filled 2013-01-13 (×26): qty 1

## 2013-01-13 MED ORDER — ONDANSETRON HCL 4 MG/2ML IJ SOLN: 4.0000 mg | Freq: Four times a day (QID) | INTRAMUSCULAR | Status: DC | PRN

## 2013-01-13 MED ORDER — ENOXAPARIN SODIUM 30 MG/0.3ML ~~LOC~~ SOLN: 30.0000 mg | SUBCUTANEOUS | Status: DC

## 2013-01-13 MED ORDER — SODIUM CHLORIDE 0.9 % IJ SOLN
10.0000 mL | INTRAMUSCULAR | Status: DC | PRN
  Administered 2013-01-14 – 2013-01-15 (×2): 10 mL
  Administered 2013-01-17: 20 mL
  Administered 2013-01-18 (×2): 10 mL
  Administered 2013-01-19: 30 mL

## 2013-01-13 MED ORDER — SORBITOL 70 % SOLN: 30.0000 mL | Freq: Every day | Status: DC | PRN

## 2013-01-13 MED ORDER — METFORMIN HCL ER 500 MG PO TB24
500.0000 mg | ORAL_TABLET | Freq: Two times a day (BID) | ORAL | Status: DC
  Administered 2013-01-13 – 2013-01-21 (×17): 500 mg via ORAL
  Filled 2013-01-13 (×18): qty 1

## 2013-01-13 MED ORDER — LISINOPRIL 2.5 MG PO TABS
2.5000 mg | ORAL_TABLET | Freq: Every day | ORAL | Status: DC
  Administered 2013-01-14 – 2013-01-21 (×8): 2.5 mg via ORAL
  Filled 2013-01-13 (×9): qty 1

## 2013-01-13 MED ORDER — GUAIFENESIN-DM 100-10 MG/5ML PO SYRP
15.0000 mL | ORAL_SOLUTION | ORAL | Status: DC | PRN
  Administered 2013-01-13 – 2013-01-15 (×2): 15 mL via ORAL
  Filled 2013-01-13 (×2): qty 15

## 2013-01-13 MED ORDER — INSULIN ASPART 100 UNIT/ML ~~LOC~~ SOLN
0.0000 [IU] | Freq: Three times a day (TID) | SUBCUTANEOUS | Status: DC
  Administered 2013-01-14 – 2013-01-16 (×5): 2 [IU] via SUBCUTANEOUS
  Administered 2013-01-16: 3 [IU] via SUBCUTANEOUS
  Administered 2013-01-17 – 2013-01-19 (×5): 2 [IU] via SUBCUTANEOUS
  Administered 2013-01-20: 3 [IU] via SUBCUTANEOUS
  Administered 2013-01-20: 2 [IU] via SUBCUTANEOUS
  Administered 2013-01-21: 3 [IU] via SUBCUTANEOUS
  Administered 2013-01-21: 2 [IU] via SUBCUTANEOUS

## 2013-01-13 NOTE — Progress Notes (Signed)
Report called to [facility] on 4 W (rehab). Wife at bedside updated. Pt transported by Wheelchair.

## 2013-01-13 NOTE — Progress Notes (Addendum)
[facility] Progress Note  01/13/2013 10:45 AM 3 Days Post-Op  Subjective:  "It's cold in here".  States that his groin is swollen and he has too much tape on the left groin.  Tm 99.3 now 98.9 VSS 97% RA  Filed Vitals:   01/13/13 0333  BP: 147/79  Pulse: 83  Temp: 98.9 F (37.2 C)  Resp: 18    Physical Exam: Incisions:  Left groin with nylon sutures in place; saphenous vein harvest sites with staples in tact.  BK incision is c/d/i. Extremities:  + doppler signal in the left PT/PT; there is a + peroneal doppler signal in the right foot.  There is a doppler signal in the femoral to femoral bypass graft as well.  CBC    Component Value Date/Time   WBC 5.1 01/12/2013 0500   WBC 6.3 12/01/2012 1008   RBC 2.87* 01/12/2013 0500   RBC 3.87* 12/01/2012 1008   RBC 3.54* 01/26/2012 0455   HGB 8.0* 01/12/2013 0500   HGB 10.0* 12/01/2012 1008   HCT 25.1* 01/12/2013 0500   HCT 31.1* 12/01/2012 1008   PLT 204 01/12/2013 0500   PLT 317 12/01/2012 1008   MCV 87.5 01/12/2013 0500   MCV 80.5 12/01/2012 1008   MCH 27.9 01/12/2013 0500   MCH 25.9* 12/01/2012 1008   MCHC 31.9 01/12/2013 0500   MCHC 32.2 12/01/2012 1008   RDW 16.8* 01/12/2013 0500   RDW 16.1* 12/01/2012 1008   LYMPHSABS 1.0 01/12/2013 0500   LYMPHSABS 1.8 12/01/2012 1008   MONOABS 0.6 01/12/2013 0500   MONOABS 0.7 12/01/2012 1008   EOSABS 0.2 01/12/2013 0500   EOSABS 0.1 12/01/2012 1008   BASOSABS 0.0 01/12/2013 0500   BASOSABS 0.1 12/01/2012 1008    BMET    Component Value Date/Time   NA 137 01/12/2013 0500   NA 138 12/01/2012 1008   NA 147* 10/12/2011 0959   K 3.7 01/12/2013 0500   K 5.0 12/01/2012 1008   K 5.2* 10/12/2011 0959   CL 104 01/12/2013 0500   CL 102 05/19/2012 1427   CL 102 10/12/2011 0959   CO2 24 01/12/2013 0500   CO2 23 12/01/2012 1008   CO2 29 10/12/2011 0959   GLUCOSE 148* 01/12/2013 0500   GLUCOSE 195* 12/01/2012 1008   GLUCOSE 205* 05/19/2012 1427   GLUCOSE 244* 10/12/2011 0959   BUN 9 01/12/2013 0500   BUN 13.9  12/01/2012 1008   BUN 13 10/12/2011 0959   CREATININE 1.05 01/12/2013 0500   CREATININE 1.4* 12/01/2012 1008   CREATININE 1.0 10/12/2011 0959   CALCIUM 8.0* 01/12/2013 0500   CALCIUM 9.9 12/01/2012 1008   CALCIUM 9.0 10/12/2011 0959   GFRNONAA 71* 01/12/2013 0500   GFRAA 82* 01/12/2013 0500    INR    Component Value Date/Time   INR 1.06 01/02/2013 1214     Intake/Output Summary (Last 24 hours) at 01/13/13 1045 Last data filed at 01/13/13 0900  Gross per 24 hour  Intake    340 ml  Output 1167.5 ml  Net -827.5 ml     Assessment:  67 y.o. male is s/p:  Left femoral to below knee popliteal artery bypass with non reversed greater saphenous vein, patch angioplasty left common femoral artery, intraop agram POD 7  And  #1 attempted thrombectomy left femoral-popliteal saphenous vein graft-unsuccessful  #2 insertion of new left common femoral to popliteal (below knee) bypass using a 6 mm Gore-Tex-propaten graft #3 intraoperative arteriogram left leg 3 Days Post-Op  Plan: -pt doing well.  His femoral to femoral bypass is patent with + doppler signal and his left fem pop bypass graft is patent. -will discharge to CIR today -DVT prophylaxis:  Lovenox -dry dressing to left groin to keep area from moisture and helping to prevent infection.   Doreatha Massed, PA-C [facility] [phone] 01/13/2013 10:45 AM    Agree with above So far all incisions healing Biphasic PT left foot  Right foot warm Doppler in ax fem and fem fem To Rehab  Fabienne Bruns, MD [facility] [place] Office: [phone] Pager: [phone]

## 2013-01-13 NOTE — Discharge Summary (Signed)
[facility] Discharge Summary   Patient ID:  Donald [name] MRN: [id number] DOB/AGE: 1946/04/08 67 y.o.  Admit date: 01/06/2013 Discharge date: 01/13/2013 Date of Surgery: 01/06/2013 Surgeon: Surgeon(s): Pryor Ochoa, MD  Admission Diagnosis: Claudication occluded fem pop   Discharge Diagnoses:  Claudication occluded fem pop Occlusion fem-pop bypass with redo  Secondary Diagnoses: Past Medical History  Diagnosis Date  . GERD (gastroesophageal reflux disease)   . Hyperlipidemia   . Hypertension   . Peripheral vascular disease   . Type II diabetes mellitus   . Pneumonia   . Internal and external bleeding hemorrhoids   . Stroke 2008    "left side weak; unable to move left hand still" (01/23/2012)  . History of gout     left great toe  . anal ca dx'd 08/2007    S/P chemo, radiation, biopsies  . 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 Depression   . Numbness in right leg     Hx: of diabetes  . Coronary artery disease     RCA occlusion with good collaterals, o/w no obs CAD 12/2011    Procedure(s): 01/06/13 Left femoral to below knee popliteal artery bypass with non reversed greater saphenous vein, patch angioplasty left common femoral artery, intraop agram 01/10/13  Procedure: Procedure(s) #1 attempted thrombectomy left femoral-popliteal saphenous vein graft-unsuccessful  #2 insertion of new left common femoral to popliteal (below knee) bypass using a 6 mm Gore-Tex-propaten graft #3 intraoperative arteriogram left leg  Discharged Condition: good  HPI: Donald [name] is a 67 y.o. male who underwent axillary bifemoral bypass 01/29/2012. His wife report a new blister on the tip of his left great toe due to new shoe ware that is healing fine and a superficial scrape on the lateral malleolus. He returns today for followup after recent CT angiogram with runoff. He thinks that the toes have gotten worse. He still has pain in the left foot. He is fairly  debilitated overall from prior stroke. He is able to transfer but is not able to ambulate much due to left-sided weakness. He now has a limb threatening situation with ulcerations and blistering of his left first and second toe with decreased ABIs. He has evidence of common femoral artery occlusive disease below the anastomosis. He also has a proximal SFA occlusion. He also has popliteal artery occlusive disease. Apparently there is three-vessel runoff to the right foot. I believe the best option would be a left femoral to below-knee popliteal bypass with vein if this is available conduit. If not we will use prosthetic. We'll assess the adequacy of his inflow on the table. If the inflow was found to be poor then we would possibly also revise the proximal anastomosis as well. Most likely I will only address the outflow procedure in this setting. We would consider an arteriogram to evaluate the inflow later on if it is not adequate.    Hospital Course:  Donald [name] is a 67 y.o. male is S/P  Left femoral to below knee popliteal artery bypass with non reversed greater saphenous vein, patch angioplasty left common femoral artery, intraop agram  Pt had a CTA of arch vessels on 01/09/13 and found to have irregular nonocclusive  thrombus along the posterior wall of the common femoral artery in  the region of the patchy angioplasty and the saphenous vein bypass  graft is completely occluded approximately 1 cm beyond the  Anastomosis. Pt was taken to OR on 01/10/13 for Procedure(s) #1  attempted thrombectomy left femoral-popliteal saphenous vein graft-unsuccessful  #2 insertion of new left common femoral to popliteal (below knee) bypass using a 6 mm Gore-Tex-propaten graft #3 intraoperative arteriogram left leg Post-operatively pt had DP/PT flow to foot  Extubated: POD # 0 Physical exam:Pt is A&O x 3  left lower extremity: Incision/s is/are clean,dry.intact, and healing  without hematoma, erythema or  drainage  Limb is warm; with good color  Palpable PT LLE Left great toe ulcer stable Post-op wounds healing well Pt. Ambulating limited, voiding and taking PO diet without difficulty. Pt pain controlled with PO pain meds. Labs as below Complications:none  Consults:     Significant Diagnostic Studies: CBC Lab Results  Component Value Date   WBC 5.1 01/12/2013   HGB 8.0* 01/12/2013   HCT 25.1* 01/12/2013   MCV 87.5 01/12/2013   PLT 204 01/12/2013    BMET    Component Value Date/Time   NA 137 01/12/2013 0500   NA 138 12/01/2012 1008   NA 147* 10/12/2011 0959   K 3.7 01/12/2013 0500   K 5.0 12/01/2012 1008   K 5.2* 10/12/2011 0959   CL 104 01/12/2013 0500   CL 102 05/19/2012 1427   CL 102 10/12/2011 0959   CO2 24 01/12/2013 0500   CO2 23 12/01/2012 1008   CO2 29 10/12/2011 0959   GLUCOSE 148* 01/12/2013 0500   GLUCOSE 195* 12/01/2012 1008   GLUCOSE 205* 05/19/2012 1427   GLUCOSE 244* 10/12/2011 0959   BUN 9 01/12/2013 0500   BUN 13.9 12/01/2012 1008   BUN 13 10/12/2011 0959   CREATININE 1.05 01/12/2013 0500   CREATININE 1.4* 12/01/2012 1008   CREATININE 1.0 10/12/2011 0959   CALCIUM 8.0* 01/12/2013 0500   CALCIUM 9.9 12/01/2012 1008   CALCIUM 9.0 10/12/2011 0959   GFRNONAA 71* 01/12/2013 0500   GFRAA 82* 01/12/2013 0500   COAG Lab Results  Component Value Date   INR 1.06 01/02/2013   INR 1.05 01/24/2012   INR 0.98 01/23/2012     Disposition:  Discharge to :Rehab - CIR      Future Appointments Provider Department Dept Phone   01/22/2013 10:00 AM [facility] [phone]   02/05/2013 10:45 AM Sherren Kerns, MD [facility] [phone]   03/09/2013 1:30 PM [facility] [phone]   05/04/2013 1:30 PM [facility] [phone]   05/15/2013 2:30 PM Nilda Riggs, NP [facility] [phone]    06/05/2013 10:30 AM [facility] [phone]   06/05/2013 11:00 AM [facility] 4 (Chest) [facility] [phone]   06/05/2013 11:30 AM Wl-Ct 2 [facility] [phone]   Patient to arrive 15 minutes prior to appointment time. Patient to pick up oral contrast at least 1 day prior to exam, unless otherwise instructed by your physician. No solid food 4 hours prior to exam. Liquids and Medicines are okay.   06/09/2013 3:00 PM Benjiman Core, MD [facility] MEDICAL ONCOLOGY [phone]   06/29/2013 1:30 PM [facility] [phone]       Medication List         acidophilus Caps capsule  Take 1 capsule by mouth daily.     ALIGN PO  Take 1 tablet by mouth at bedtime.     buPROPion 150 MG 12  hr tablet  Commonly known as:  ZYBAN  Take 150 mg by mouth daily.     dipyridamole-aspirin 200-25 MG per 12 hr capsule  Commonly known as:  AGGRENOX  Take 1 capsule by mouth 2 (two) times daily.     docusate sodium 100 MG capsule  Commonly known as:  COLACE  Take 200 mg by mouth 2 (two) times daily.     gabapentin 300 MG capsule  Commonly known as:  NEURONTIN  Take 300 mg by mouth 3 (three) times daily.     lisinopril 5 MG tablet  Commonly known as:  PRINIVIL,ZESTRIL  Take 0.5 tablets (2.5 mg total) by mouth daily.     metFORMIN 500 MG (MOD) 24 hr tablet  Commonly known as:  GLUMETZA  Take 500 mg by mouth 2 (two) times daily with a meal.     mupirocin ointment 2 %  Commonly known as:  BACTROBAN  1 Application, nasally, 2 times daily for 5 days prior to surgery     omega-3 acid ethyl esters 1 G capsule  Commonly known as:  LOVAZA  Take 2 g by mouth 2 (two) times daily.     omeprazole 40 MG capsule  Commonly known as:  PRILOSEC  Take 40 mg by mouth daily.     vitamin C 1000 MG tablet  Take 1,000 mg by mouth  daily.       Verbal and written Discharge instructions given to the patient. Wound care per Discharge AVS Follow-up Information   Follow up with Sherren Kerns, MD In 4 weeks. (office will arrange sent)    Specialty:  Vascular Surgery   Contact information:   [address] [place] [address] [phone]       Follow up with [facility] Nurse In 2 weeks. (staple removal)       Signed: Diamonte Stavely J 01/13/2013, 10:51 AM   - For VQI Registry use --- Instructions: Press F2 to tab through selections.  Delete question if not applicable.   Post-op:  Wound infection: No  Graft infection: No  Transfusion: No   New Arrhythmia: No Ipsilateral amputation: [ x] no, [ ]  Minor, [ ]  BKA, [ ]  AKA Discharge patency: [ ]  Primary, [ ]  Primary assisted, [x ] Secondary, [ ]  Occluded Patency judged by: [x ] Dopper only, [x ] Palpable graft pulse, [ ]  Palpable distal pulse, [ ]  ABI inc. > 0.15, [ ]  Duplex Discharge ABI: R 0.39, L 0.82 D/C Ambulatory Status: Ambulatory with Assistance  Complications: MI: [x ] No, [ ]  Troponin only, [ ]  EKG or Clinical CHF: No Resp failure: [x ] none, [ ]  Pneumonia, [ ]  Ventilator Chg in renal function: [x ] none, [ ]  Inc. Cr > 0.5, [ ]  Temp. Dialysis, [ ]  Permanent dialysis Stroke: [x ] None, [ ]  Minor, [ ]  Major Return to OR: Yes  Reason for return to OR: [ ]  Bleeding, [ ]  Infection, [x ] Thrombosis, [ x] Revision   Discharge medications: Statin use:  Yes ASA use: yes in aggrenox Plavix use: no Aggrenox: yes Beta blocker use: no  Coumadin use: no

## 2013-01-13 NOTE — Progress Notes (Signed)
Rehab admissions - Patient evaluated for possible admission to inpatient rehab.  Noted now ready for rehab.  Bed available and will admit to acute inpatient rehab today.  Call me for questions.  #[phone]

## 2013-01-13 NOTE — H&P (Signed)
Physical Medicine and Rehabilitation Admission H&P  No chief complaint on file.  :  HPI: Donald [name] is a 67 y.o. right-handed male well known to rehabilitation services from admission October 2013 for deconditioning related to multiple medical issues, peripheral vascular disease status post axillary bifemoral bypass 01/29/2012 as well as CVA with spastic left hemiparesis and maintained on Aggrenox therapy and tobacco abuse. Admitted 01/06/2013 with a new blister tip of his left great toe and increased rest pain. CT angioplasty lower extremity show critical stenosis left common femoral artery just distal to the femoral bypass graft. Underwent a femoral to below knee popliteal artery bypass with patch angioplasty left common femoral artery 01/06/2013 per Dr. Darrick Penna. A intraoperative single static arteriogram image showed patent bypass graft with three-vessel runoff. Acute blood loss anemia 8.0 and monitored. Followup ABI studies indicate a moderate reduction in arterial flow on the right and a mild reduction on the left. CT angiography of chest, abdomen and pelvis with iliofemoral runoff 01/10/2013 to evaluate axillary and subclavian arteries showed occluded left femoral-popliteal saphenous vein graft and underwent redo left femoral-popliteal bypass graft 01/10/2013 per Dr. Hart Rochester. Placed on subcutaneous Lovenox for DVT prophylaxis 01/11/2013. Patient remains on Aggrenox for history of CVA. Physical occupational therapy evaluations completed an ongoing was noted deconditioning. M.D. as requested physical medicine rehabilitation consult to consider inpatient rehabilitation services. Patient was felt to be a good candidate for inpatient rehabilitation services and was admitted for a comprehensive rehabilitation program  Review of Systems  Cardiovascular: Positive for leg swelling.  Gastrointestinal: Positive for constipation.  GERD  Neurological: Positive for weakness.  Psychiatric/Behavioral: Positive  for depression.  All other systems reviewed and are negative  Past Medical History   Diagnosis  Date   .  GERD (gastroesophageal reflux disease)    .  Hyperlipidemia    .  Hypertension    .  Peripheral vascular disease    .  Type II diabetes mellitus    .  Pneumonia    .  Internal and external bleeding hemorrhoids    .  Stroke  2008     "left side weak; unable to move left hand still" (01/23/2012)   .  History of gout      left great toe   .  anal ca  dx'd 08/2007     S/P chemo, radiation, biopsies   .  Metastases to the liver  dx'd 08/2010   .  Depression    .  Numbness in right leg      Hx: of diabetes   .  Coronary artery disease      RCA occlusion with good collaterals, o/w no obs CAD 12/2011    Past Surgical History   Procedure  Laterality  Date   .  Carotid endarterectomy   2008     right   .  Liver cryoablation   08/2010     "chemo shrunk the cancer 50% then they went in and burned the rest of it"   .  Mass biopsy       rectal; "found cancer"   .  Axillary-femoral bypass graft   01/29/2012     Procedure: BYPASS GRAFT AXILLA-BIFEMORAL; Surgeon: Sherren Kerns, MD; Location: [facility] OR; Service: Vascular; Laterality: N/A; Left axilla-bifemoral bypass using gore-tex graft.   .  Colonoscopy w/ biopsies and polypectomy       Hx: of   .  Femoral-popliteal bypass graft  Left  01/06/2013     Procedure: Femoral-Popliteal  Artery Bypass; Surgeon: Sherren Kerns, MD; Location: [facility] OR; Service: Vascular; Laterality: Left;   .  Intraoperative arteriogram  Left  01/06/2013     Procedure: INTRA OPERATIVE ARTERIOGRAM; Surgeon: Sherren Kerns, MD; Location: [facility] OR; Service: Vascular; Laterality: Left;    Family History   Problem  Relation  Age of Onset   .  Cancer - Other  Sister     Social History: reports that he has been smoking Cigarettes. He has a 5 pack-year smoking history. He has never used smokeless tobacco. He reports that he does not drink alcohol or use illicit drugs.  Allergies:   Allergies   Allergen  Reactions   .  Other      Became hypotensive. Pt states allergic to "muscle relaxer" but unsure of name.   .  Tape  Hives and Itching     medfix non woven tape    Medications Prior to Admission   Medication  Sig  Dispense  Refill   .  acidophilus (RISAQUAD) CAPS  Take 1 capsule by mouth daily.     .  Ascorbic Acid (VITAMIN C) 1000 MG tablet  Take 1,000 mg by mouth daily.     Marland Kitchen  buPROPion (ZYBAN) 150 MG 12 hr tablet  Take 150 mg by mouth daily.     Marland Kitchen  dipyridamole-aspirin (AGGRENOX) 200-25 MG per 12 hr capsule  Take 1 capsule by mouth 2 (two) times daily.  180 capsule  1   .  docusate sodium (COLACE) 100 MG capsule  Take 200 mg by mouth 2 (two) times daily.     Marland Kitchen  gabapentin (NEURONTIN) 300 MG capsule  Take 300 mg by mouth 3 (three) times daily.     Marland Kitchen  lisinopril (PRINIVIL,ZESTRIL) 5 MG tablet  Take 0.5 tablets (2.5 mg total) by mouth daily.     .  metFORMIN (GLUMETZA) 500 MG (MOD) 24 hr tablet  Take 500 mg by mouth 2 (two) times daily with a meal.     .  mupirocin ointment (BACTROBAN) 2 %  1 Application, nasally, 2 times daily for 5 days prior to surgery  22 g  0   .  omega-3 acid ethyl esters (LOVAZA) 1 G capsule  Take 2 g by mouth 2 (two) times daily.     Marland Kitchen  omeprazole (PRILOSEC) 40 MG capsule  Take 40 mg by mouth daily.     .  Probiotic Product (ALIGN PO)  Take 1 tablet by mouth at bedtime.      Home:  Home Living  Family/patient expects to be discharged to:: Private residence  Living Arrangements: Spouse/significant other  Available Help at Discharge: Family  Type of Home: House  Home Access: Stairs to enter  Secretary/administrator of Steps: 10  Entrance Stairs-Rails: Right;Left  Home Layout: Two level;Bed/bath upstairs  Alternate Level Stairs-Number of Steps: flight  Alternate Level Stairs-Rails: Right;Left (chair lift)  Home Equipment: Walker - 2 wheels;Cane - single point;Bedside commode;Shower seat;Hand held shower head;Grab bars - tub/shower   Additional Comments: Pt has a stair chair lift now for going upstairs.  Functional History:  Prior Function  Comments: used cane PTA  Functional Status:  Mobility:  Bed Mobility  Bed Mobility: Not assessed  Transfers  Transfers: Sit to Stand;Stand to Sit  Sit to Stand: 4: Min assist;With upper extremity assist;With armrests;From chair/3-in-1  Stand to Sit: 4: Min assist;With upper extremity assist;With armrests;To chair/3-in-1  Ambulation/Gait  Ambulation/Gait Assistance: 1: +2 Total assist  Ambulation/Gait: Patient  Percentage: 80%  Ambulation Distance (Feet): 50 Feet  Assistive device: Rolling walker  Ambulation/Gait Assistance Details: Pt ambulated with RW with left UE handpiece on. Needed +2 assist for chair follow as well as needed cues and assist to sequence steps and RW as well as to hold left UE onto hand grip. Gait deviations apparent however pt states his gait is close to baseline. Pt unsteady with continued poor quality of movement of left LE with ambulation.  Gait Pattern: Step-to pattern;Decreased step length - left;Decreased weight shift to left;Shuffle;Trunk flexed;Wide base of support  Gait velocity: decreased  Stairs: No  Wheelchair Mobility  Wheelchair Mobility: No  ADL:  ADL  Eating/Feeding: Supervision/safety;Set up  Grooming: Minimal assistance  Where Assessed - Grooming: Supported sitting  Upper Body Bathing: Moderate assistance  Where Assessed - Upper Body Bathing: Supported sitting  Lower Body Bathing: +1 Total assistance  Where Assessed - Lower Body Bathing: Supported sit to stand  Upper Body Dressing: Moderate assistance  Where Assessed - Upper Body Dressing: Unsupported sitting  Lower Body Dressing: Maximal assistance (due to pain in LLE even when attempting RLE)  Where Assessed - Lower Body Dressing: Unsupported sitting  Toilet Transfer: Minimal assistance  Toilet Transfer Method: Sit to stand  Toilet Transfer Equipment: (Recliner> out door and down  hallway>recliner behind him)  Equipment Used: Gait belt;Rolling walker (LUE walker splint)  Transfers/Ambulation Related to ADLs: min A sit<>stand; total A +2 (pt=80%) ambulation  ADL Comments: focus of sessionon mobility. Wife assisting with toileting.  Cognition:  Cognition  Overall Cognitive Status: Within Functional Limits for tasks assessed  Orientation Level: Oriented X4  Cognition  Arousal/Alertness: Awake/alert  Behavior During Therapy: WFL for tasks assessed/performed  Overall Cognitive Status: Within Functional Limits for tasks assessed    Physical Exam:  Blood pressure 147/79, pulse 83, temperature 98.9 F (37.2 C), temperature source Oral, resp. rate 18, height 5\' 9"  (1.753 m), weight 90.3 kg (199 lb 1.2 oz), SpO2 97.00%.  Constitutional: He is oriented to person, place, and time. No distress  HENT:  Head: Normocephalic. Tongue pink,moist, dentition good  Eyes: EOM are normal.  Neck: Normal range of motion. Neck supple. No thyromegaly present.  Cardiovascular: Normal rate and irregular rhythm. No murmur  Pulmonary/Chest: Effort normal and breath sounds normal. No respiratory distress. No wheezes  Abdominal: Soft. Bowel sounds are normal. He exhibits no distension. There is no tenderness.  Neurological: He is alert and oriented to person, place, and time.  Musc: left leg with 1++ edema, tender along incision  Left hemiparesis, sensory deficits. LUE is 1-2/5 bicep, tricep, deltoid, HI and wrist are 1+ with apraxia, inhibited by tone which was variable, 2-3/4. LLE is grossly 1-2/5  inhibited by pain/swelling in left leg. Tone is 1+.  Left central 7 with good tongue control. Speech clear. Cognitively with good insight and awareness.  Skin: Bypass graft site clean and dry with some surrounding erythema.  Dry dressing to left first toe  , patient with chronic vascular changes in either leg   Results for orders placed during the hospital encounter of 01/06/13 (from the past 48  hour(s))   GLUCOSE, CAPILLARY Status: Abnormal    Collection Time    01/11/13 7:32 AM   Result  Value  Range    Glucose-Capillary  161 (*)  70 - 99 mg/dL   GLUCOSE, CAPILLARY Status: Abnormal    Collection Time    01/11/13 11:56 AM   Result  Value  Range    Glucose-Capillary  163 (*)  70 - 99 mg/dL   GLUCOSE, CAPILLARY Status: Abnormal    Collection Time    01/11/13 5:07 PM   Result  Value  Range    Glucose-Capillary  131 (*)  70 - 99 mg/dL    Comment 1  Notify RN    GLUCOSE, CAPILLARY Status: Abnormal    Collection Time    01/11/13 10:12 PM   Result  Value  Range    Glucose-Capillary  155 (*)  70 - 99 mg/dL   COMPREHENSIVE METABOLIC PANEL Status: Abnormal    Collection Time    01/12/13 5:00 AM   Result  Value  Range    Sodium  137  135 - 145 mEq/L    Potassium  3.7  3.5 - 5.1 mEq/L    Chloride  104  96 - 112 mEq/L    CO2  24  19 - 32 mEq/L    Glucose, Bld  148 (*)  70 - 99 mg/dL    BUN  9  6 - 23 mg/dL    Creatinine, Ser  1.61  0.50 - 1.35 mg/dL    Calcium  8.0 (*)  8.4 - 10.5 mg/dL    Total Protein  5.7 (*)  6.0 - 8.3 g/dL    Albumin  2.3 (*)  3.5 - 5.2 g/dL    AST  13  0 - 37 U/L    ALT  6  0 - 53 U/L    Alkaline Phosphatase  63  39 - 117 U/L    Total Bilirubin  0.2 (*)  0.3 - 1.2 mg/dL    GFR calc non Af Amer  71 (*)  >90 mL/min    GFR calc Af Amer  82 (*)  >90 mL/min    Comment:  (NOTE)     The eGFR has been calculated using the CKD EPI equation.     This calculation has not been validated in all clinical situations.     eGFR's persistently <90 mL/min signify possible Chronic Kidney     Disease.   CBC WITH DIFFERENTIAL Status: Abnormal    Collection Time    01/12/13 5:00 AM   Result  Value  Range    WBC  5.1  4.0 - 10.5 K/uL    RBC  2.87 (*)  4.22 - 5.81 MIL/uL    Hemoglobin  8.0 (*)  13.0 - 17.0 g/dL    HCT  09.6 (*)  04.5 - 52.0 %    MCV  87.5  78.0 - 100.0 fL    MCH  27.9  26.0 - 34.0 pg    MCHC  31.9  30.0 - 36.0 g/dL    RDW  40.9 (*)  81.1 - 15.5 %     Platelets  204  150 - 400 K/uL    Neutrophils Relative %  65  43 - 77 %    Neutro Abs  3.3  1.7 - 7.7 K/uL    Lymphocytes Relative  19  12 - 46 %    Lymphs Abs  1.0  0.7 - 4.0 K/uL    Monocytes Relative  12  3 - 12 %    Monocytes Absolute  0.6  0.1 - 1.0 K/uL    Eosinophils Relative  4  0 - 5 %    Eosinophils Absolute  0.2  0.0 - 0.7 K/uL    Basophils Relative  0  0 - 1 %    Basophils Absolute  0.0  0.0 - 0.1 K/uL   APTT Status: None    Collection Time    01/12/13 5:00 AM   Result  Value  Range    aPTT  28  24 - 37 seconds   GLUCOSE, CAPILLARY Status: Abnormal    Collection Time    01/12/13 7:48 AM   Result  Value  Range    Glucose-Capillary  149 (*)  70 - 99 mg/dL    Comment 1  Notify RN     Comment 2  Documented in Chart    GLUCOSE, CAPILLARY Status: Abnormal    Collection Time    01/12/13 11:57 AM   Result  Value  Range    Glucose-Capillary  196 (*)  70 - 99 mg/dL    Comment 1  Notify RN     Comment 2  Documented in Chart    GLUCOSE, CAPILLARY Status: Abnormal    Collection Time    01/12/13 3:58 PM   Result  Value  Range    Glucose-Capillary  110 (*)  70 - 99 mg/dL    Comment 1  Notify RN    GLUCOSE, CAPILLARY Status: Abnormal    Collection Time    01/12/13 8:46 PM   Result  Value  Range    Glucose-Capillary  115 (*)  70 - 99 mg/dL   APTT Status: None    Collection Time    01/13/13 5:35 AM   Result  Value  Range    aPTT  30  24 - 37 seconds    No results found.  Post Admission Physician Evaluation:  1. Functional deficits secondary to deconditioning, pain related to PVD and subsequent LLE BPG. He has a history of left hemiparesis as well 2. Patient is admitted to receive collaborative, interdisciplinary care between the physiatrist, rehab nursing staff, and therapy team. 3. Patient's level of medical complexity and substantial therapy needs in context of that medical necessity cannot be provided at a lesser intensity of care such as a SNF. 4. Patient has  experienced substantial functional loss from his/her baseline which was documented above under the "Functional History" and "Functional Status" headings. Judging by the patient's diagnosis, physical exam, and functional history, the patient has potential for functional progress which will result in measurable gains while on inpatient rehab. These gains will be of substantial and practical use upon discharge in facilitating mobility and self-care at the household level. 5. Physiatrist will provide 24 hour management of medical needs as well as oversight of the therapy plan/treatment and provide guidance as appropriate regarding the interaction of the two. 6. 24 hour rehab nursing will assist with bladder management, bowel management, safety, skin/wound care, disease management, medication administration, pain management and patient education and help integrate therapy concepts, techniques,education, etc. 7. PT will assess and treat for/with: Lower extremity strength, range of motion, stamina, balance, functional mobility, safety, adaptive techniques and equipment, pain mgt, tone control. Goals are: supervision to mod I. 8. OT will assess and treat for/with: ADL's, functional mobility, safety, upper extremity strength, adaptive techniques and equipment, pain mgt, education. Goals are: mod I to set up. 9. SLP will assess and treat for/with: n/a. Goals are: n/a. 10. Case Management and Social Worker will assess and treat for psychological issues and discharge planning. 11. Team conference will be held weekly to assess progress toward goals and to determine barriers to discharge. 12. Patient will receive at least 3 hours of therapy per day at least 5 days per week. 13. ELOS:  7 days  14. Prognosis: excellent   Medical Problem List and Plan:  1. Severe peripheral artery disease status post left F. PBG with redo 01/10/2013 secondary to occluded graft with history of spastic left hemiparesis secondary to CVA   2. DVT Prophylaxis/Anticoagulation: Subcutaneous Lovenox. Monitor platelet counts and any signs of bleeding  3. Pain Management: Neurontin 300 mg 3 times a day, Percocet as needed. Monitor with increased mobility  4. Neuropsych: This patient is capable of making decisions on his own behalf.  5. Acute blood loss anemia. Followup CBC  6. Hypertension. Lisinopril 2.5 mg daily. Monitor with increased mobility  7. Diabetes mellitus with peripheral neuropathy. Hemoglobin A1c 7.4. Check blood sugars a.c. and at bedtime  8. Tobacco abuse. Continue Zyban and provide counseling  9. Hyperlipidemia. Lovaza  10. GERD. Protonix   Ranelle Oyster, MD, [facility] Physical Medicine & Rehabilitation   01/13/2013

## 2013-01-13 NOTE — Progress Notes (Signed)
Pt admitted to 4W1, pt alert and oriented, wife present at bedside, pt and wife oriented to room, rehab schedule, safety plan and call bell, pt resting in bed with call bell in reach

## 2013-01-14 ENCOUNTER — Inpatient Hospital Stay (HOSPITAL_COMMUNITY): Payer: Medicare Other | Admitting: Occupational Therapy

## 2013-01-14 ENCOUNTER — Inpatient Hospital Stay (HOSPITAL_COMMUNITY): Payer: Medicare Other | Admitting: *Deleted

## 2013-01-14 ENCOUNTER — Inpatient Hospital Stay (HOSPITAL_COMMUNITY): Payer: Medicare Other | Admitting: Physical Therapy

## 2013-01-14 DIAGNOSIS — R5381 Other malaise: Secondary | ICD-10-CM

## 2013-01-14 DIAGNOSIS — G8110 Spastic hemiplegia affecting unspecified side: Secondary | ICD-10-CM

## 2013-01-14 DIAGNOSIS — I739 Peripheral vascular disease, unspecified: Secondary | ICD-10-CM

## 2013-01-14 LAB — CBC WITH DIFFERENTIAL/PLATELET
Basophils Absolute: 0 10*3/uL (ref 0.0–0.1)
Eosinophils Absolute: 0.1 10*3/uL (ref 0.0–0.7)
Eosinophils Relative: 1 % (ref 0–5)
HCT: 25.9 % — ABNORMAL LOW (ref 39.0–52.0)
Lymphocytes Relative: 15 % (ref 12–46)
Lymphs Abs: 1.1 10*3/uL (ref 0.7–4.0)
MCH: 27.6 pg (ref 26.0–34.0)
MCV: 85.2 fL (ref 78.0–100.0)
Monocytes Absolute: 0.7 10*3/uL (ref 0.1–1.0)
Platelets: 321 10*3/uL (ref 150–400)
RDW: 16.5 % — ABNORMAL HIGH (ref 11.5–15.5)

## 2013-01-14 LAB — COMPREHENSIVE METABOLIC PANEL
ALT: 10 U/L (ref 0–53)
CO2: 24 mEq/L (ref 19–32)
Calcium: 8.7 mg/dL (ref 8.4–10.5)
Creatinine, Ser: 1.02 mg/dL (ref 0.50–1.35)
GFR calc Af Amer: 85 mL/min — ABNORMAL LOW (ref >90)
GFR calc non Af Amer: 73 mL/min — ABNORMAL LOW (ref >90)
Glucose, Bld: 150 mg/dL — ABNORMAL HIGH (ref 70–99)
Sodium: 137 mEq/L (ref 135–145)
Total Protein: 5.9 g/dL — ABNORMAL LOW (ref 6.0–8.3)

## 2013-01-14 LAB — GLUCOSE, CAPILLARY
Glucose-Capillary: 137 mg/dL — ABNORMAL HIGH (ref 70–99)
Glucose-Capillary: 117 mg/dL — ABNORMAL HIGH (ref 70–99)
Glucose-Capillary: 138 mg/dL — ABNORMAL HIGH (ref 70–99)

## 2013-01-14 NOTE — Evaluation (Signed)
Occupational Therapy Assessment and Plan And Treatment Sessions  Patient Details  Name: Donald [name] MRN: [id number] Date of Birth: [birth year]  OT Diagnosis: abnormal posture, acute pain, cognitive deficits, H/O hemiplegia affecting non-dominant side, muscle weakness (generalized) and swelling of limb Rehab Potential: Rehab Potential: Good ELOS: 7 days   Today's Date: 01/14/2013 Time: 1000-1100 and 1:45-2:15 Time Calculation (min): 60 min and 30 min  Problem List:  Patient Active Problem List   Diagnosis Date Noted  . PVD (peripheral vascular disease) with claudication 01/14/2013  . Hemiparesis affecting left side as late effect of stroke 01/05/2013  . Abnormality of gait 09/03/2012  . Other acquired deformity of ankle and foot(736.79) 09/03/2012  . Aftercare following surgery of the circulatory system, NEC 05/15/2012  . 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 Peripheral vascular disease, unspecified 03/06/2012  . PVD (peripheral vascular disease) 02/21/2012  . Occlusion and stenosis of carotid artery without mention of cerebral infarction 02/21/2012  . Physical deconditioning 01/31/2012  . Atherosclerotic peripheral vascular disease with rest pain 01/24/2012  . Preoperative evaluation to rule out surgical contraindication 01/24/2012  . Anal carcinoma 10/30/2011  . Anal cancer 02/26/2011  . SEC MALIGNANT NEOPLASM OF LARGE INTESTINE&RECTUM 11/09/2008  . NAUSEA AND VOMITING 11/09/2008  . RECTAL MASS 08/30/2008  . CONSTIPATION 08/18/2008  . RECTAL BLEEDING 08/18/2008  . FLATULENCE-GAS-BLOATING 08/18/2008  . ABDOMINAL PAIN -GENERALIZED 08/18/2008  . DM 08/17/2008  . HYPERLIPIDEMIA 08/17/2008  . HEMORRHOIDS 08/17/2008  . CONSTIPATION, CHRONIC 08/17/2008  . CEREBROVASCULAR ACCIDENT, HX OF 08/17/2008    Past Medical History:  Past Medical History  Diagnosis Date  . GERD (gastroesophageal reflux disease)   .  Hyperlipidemia   . Hypertension   . Peripheral vascular disease   . Type II diabetes mellitus   . Pneumonia   . Internal and external bleeding hemorrhoids   . Stroke 2008    "left side weak; unable to move left hand still" (01/23/2012)  . History of gout     left great toe  . anal ca dx'd 08/2007    S/P chemo, radiation, biopsies  . 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 Depression   . Numbness in right leg     Hx: of diabetes  . Coronary artery disease     RCA occlusion with good collaterals, o/w no obs CAD 12/2011   Past Surgical History:  Past Surgical History  Procedure Laterality Date  . Carotid endarterectomy  2008    right  . Liver cryoablation  08/2010    "chemo shrunk the cancer 50% then they went in and burned the rest of it"  . Mass biopsy      rectal; "found cancer"  . Axillary-femoral bypass graft  01/29/2012    Procedure: BYPASS GRAFT AXILLA-BIFEMORAL;  Surgeon: Sherren Kerns, MD;  Location: [facility] OR;  Service: Vascular;  Laterality: N/A;  Left axilla-bifemoral bypass using gore-tex graft.   . Colonoscopy w/ biopsies and polypectomy      Hx: of  . Femoral-popliteal bypass graft Left 01/06/2013    Procedure: Femoral-Popliteal Artery Bypass;  Surgeon: Sherren Kerns, MD;  Location: [facility] OR;  Service: Vascular;  Laterality: Left;  . Intraoperative arteriogram Left 01/06/2013    Procedure: INTRA OPERATIVE ARTERIOGRAM;  Surgeon: Sherren Kerns, MD;  Location: [facility] OR;  Service: Vascular;  Laterality: Left;  . Femoral-popliteal bypass graft Left 01/10/2013    Procedure: THROMBECTOMY POSSIBLE BYPASS GRAFT FEMORAL-POPLITEAL ARTERY;  Surgeon: Pryor Ochoa, MD;  Location: [facility] OR;  Service: Vascular;  Laterality: Left;  Attempted Thrombectomy of left Femoral/Popliteal graft - Unsuccessful.  Insertion of Left femoral to below the knee popliteal propaten graft.    Assessment & Plan Clinical Impression: Donald [name] is a 67 y.o. right-handed male well known to rehabilitation  services from admission October 2013 for deconditioning related to multiple medical issues, peripheral vascular disease status post axillary bifemoral bypass 01/29/2012 as well as CVA with spastic left hemiparesis and maintained on Aggrenox therapy and tobacco abuse. Admitted 01/06/2013 with a new blister tip of his left great toe and increased rest pain. CT angioplasty lower extremity show critical stenosis left common femoral artery just distal to the femoral bypass graft. Underwent a femoral to below knee popliteal artery bypass with patch angioplasty left common femoral artery 01/06/2013 per Dr. Darrick Penna. A intraoperative single static arteriogram image showed patent bypass graft with three-vessel runoff. Acute blood loss anemia 8.0 and monitored. Followup ABI studies indicate a moderate reduction in arterial flow on the right and a mild reduction on the left. CT angiography of chest, abdomen and pelvis with iliofemoral runoff 01/10/2013 to evaluate axillary and subclavian arteries showed occluded left femoral-popliteal saphenous vein graft and underwent redo left femoral-popliteal bypass graft 01/10/2013 per Dr. Hart Rochester. Placed on subcutaneous Lovenox for DVT prophylaxis 01/11/2013. Patient remains on Aggrenox for history of CVA.  Patient transferred to CIR on 01/13/2013 .    Patient currently requires min-mod assist with basic self-care skills secondary to muscle weakness, decreased standing balance and decreased balance strategies.  Current condition is impacted by residual deficits from CVA ~6 yrs ago: abnormal tone, decreased attention and decreased safety awareness, decreased postural control, and hemiplegia .  Prior to hospitalization, patient could complete BADL with min to supervision.  Patient will benefit from skilled intervention to decrease level of assist with basic self-care skills prior to discharge home with care partner.  Anticipate patient will require 24 hour supervision and minimal physical  assistance and follow up home health.  OT - End of Session Activity Tolerance: Decreased this session Endurance Deficit: Yes Endurance Deficit Description: Fatigues quickly and requires frequent rest breaks. OT Assessment Rehab Potential: Good OT Patient demonstrates impairments in the following area(s): Balance;Cognition;Edema;Endurance;Pain;Safety OT Basic ADL's Functional Problem(s): Bathing;Dressing;Toileting;Grooming OT Transfers Functional Problem(s): Toilet;Tub/Shower OT Plan OT Intensity: Minimum of 1-2 x/day, 45 to 90 minutes OT Frequency: 5 out of 7 days OT Duration/Estimated Length of Stay: 7 days OT Treatment/Interventions: Social worker;Discharge planning;Functional mobility training;Psychosocial support;Patient/family education;Pain management;Self Care/advanced ADL retraining;Therapeutic Activities;Therapeutic Exercise;UE/LE Coordination activities OT Basic Self-Care Anticipated Outcome(s): Overall Supervision-Min assist OT Toileting Anticipated Outcome(s): Supervision OT Bathroom Transfers Anticipated Outcome(s): Supervision toilet, Min assist walk in shower with chair OT Recommendation Patient destination: Home Follow Up Recommendations: Home health OT Equipment Recommended: None recommended by OT Equipment Details: Patient has all necessary bathroom DME   Skilled Therapeutic Intervention   OT Evaluation Precautions/Restrictions  Precautions Precautions: Fall Precaution Comments: residual weakness L side-old CVA Restrictions Weight Bearing Restrictions: No Pain Pain Assessment Pain Assessment: 0-10 Pain Score: 6  Pain Type: Surgical pain Home Living/Prior Functioning Home Living Available Help at Discharge: Family (wife and sister in law can provide help as well) Type of Home: House Home Access: Stairs to enter [employer] of Steps: 16 in front of house, 1 step in back entrance (which patient  reports they use more often) Entrance Stairs-Rails: Right;Left Home Layout: Two level;Bed/bath upstairs Alternate Level Stairs-Number of Steps: flight; patient has chair lift  Alternate Level Stairs-Rails: Right;Left Additional Comments: Pt has a stair chair lift now for going upstairs.   Lives With: Spouse Prior Function Level of Independence: Requires assistive device for independence;Needs assistance with ADLs  Able to Take Stairs?: No Driving: No Vocation: Retired Leisure:  (likes to eat out, church when able) Comments: used cane PTA, has walk in shower, glass door, shower seat with back and arms, HH shower and grab bar in shower, handicapped commode with no rails. ADL Overall min-mod assist.  Refer to FIM below Vision/Perception  Vision - History Baseline Vision: Wears glasses all the time Patient Visual Report: No change from baseline  Cognition Overall Cognitive Status: Easily distracted, decreased awareness of deficits and decreased safety awareness. Sensation Sensation Light Touch: Impaired by gross assessment (LUE) Additional Comments: Sensation diminished dorsal aspect of B feet Coordination Gross Motor Movements are Fluid and Coordinated: No (RUE) Fine Motor Movements are Fluid and Coordinated: No (RUE) Coordination and Movement Description: LUE with premorbid deficits, RUE slow and decreased rhythm, accuracy and speed with isolated finger movements and rapid alternating movements. Heel Shin Test: Slight dysmetria noted L LE-residual deficits from old CVA Motor  Motor Motor: Abnormal tone;Abnormal postural alignment and control;Hemiplegia Motor - Skilled Clinical Observations: L UE spasticity from old CVA Mobility  Bed Mobility Bed Mobility: Not assessed Transfers Sit to Stand: 4: Min assist;With upper extremity assist;With armrests;From chair/3-in-1;From bed Sit to Stand Details: Verbal cues for sequencing;Verbal cues for technique;Verbal cues for  precautions/safety Stand to Sit: 4: Min assist;With upper extremity assist;With armrests;To chair/3-in-1;To bed Stand to Sit Details (indicate cue type and reason): Verbal cues for sequencing;Verbal cues for technique;Verbal cues for precautions/safety  Trunk/Postural Assessment  Cervical Assessment Cervical Assessment: Exceptions to [facility] (forward head posture) Thoracic Assessment Thoracic Assessment: Within Functional Limits Lumbar Assessment Lumbar Assessment: Within Functional Limits Postural Control Postural Control: Deficits on evaluation (residual deficits from old CVA)  Balance Balance Balance Assessed: Yes Static Sitting Balance Static Sitting - Balance Support: No upper extremity supported;Feet supported Static Sitting - Level of Assistance: 6: Modified independent (Device/Increase time) Static Standing Balance Static Standing - Balance Support: Bilateral upper extremity supported;During functional activity Static Standing - Level of Assistance: 4: Min assist Static Standing - Comment/# of Minutes: 2 Extremity/Trunk Assessment RUE Assessment RUE Assessment: Within Functional Limits LUE Assessment LUE Assessment: Exceptions to [facility] (Premorbid hemiplegia, "I don't use it for anything")  FIM:  FIM - Eating Eating Activity: 7: Complete independence:no helper FIM - Grooming Grooming Steps: Wash, rinse, dry face;Wash, rinse, dry hands;Oral care, brush teeth, clean dentures;Brush, comb hair;Shave or apply make-up Grooming: 5: Set-up assist to obtain items FIM - Bathing Bathing Steps Patient Completed: Chest;Left Arm;Abdomen (declined full bath with OT) Bathing: 3: Mod-Patient completes 5-7 62f 10 parts or 50-74% (Patient reports nursing staff assisted with peri areas) FIM - Upper Body Dressing/Undressing Upper body dressing/undressing steps patient completed: Thread/unthread right sleeve of pullover shirt/dresss;Thread/unthread left sleeve of pullover shirt/dress;Put head through  opening of pull over shirt/dress;Pull shirt over trunk Upper body dressing/undressing: 5: Set-up assist to: Obtain clothing/put away FIM - Lower Body Dressing/Undressing Lower body dressing/undressing steps patient completed: Don/Doff right sock;Don/Doff right shoe;Fasten/unfasten right shoe Lower body dressing/undressing: 2: Max-Patient completed 25-49% of tasks FIM - Tub/Shower Transfers Tub/shower Transfers: 0-Activity did not occur or was simulated (declined and awaiting MD approval)   Refer to Care Plan for Long Term Goals  Recommendations for other services: None  Discharge Criteria: Patient will be discharged from OT if patient refuses treatment 3  consecutive times without medical reason, if treatment goals not met, if there is a change in medical status, if patient makes no progress towards goals or if patient is discharged from hospital.  The above assessment, treatment plan, treatment alternatives and goals were discussed and mutually agreed upon: by patient and by family  Shantae Vantol 01/14/2013, 3:43 PM

## 2013-01-14 NOTE — Progress Notes (Signed)
Patient information reviewed and entered into [facility] by Becky Makaylen Thieme, covering PPS coordinator. Information including medical coding and functional independence measure will be reviewed and updated through discharge.  Per nursing, patient was given "Data Collection Information Summary for Patients in Inpatient Rehabilitation Facilities with attached Privacy Act Statement Health Care Records" upon admission.     

## 2013-01-14 NOTE — Progress Notes (Signed)
Physical Therapy Note  Patient Details  Name: Donald [name] MRN: [id number] Date of Birth: 1945-09-28 Today's Date: 01/14/2013  1300-1340 (40 minutes) individual Pain: 6/10 left LE/ nurse notified Focus of treatment: Gait training; wc mobility training Treatment : sit to stand min assist for balance; gait 45 feet x 2 RW (distance limited by fatigue) with hand splint left min assist; standing min assist for balance without AD ; supine >< sit (mat ) SBA; wc mobility 120 feet using rt extremities with 2 seated rest breaks secondary to pain right groin.    Crews Mccollam,JIM 01/14/2013, 1:39 PM

## 2013-01-14 NOTE — Evaluation (Signed)
Physical Therapy Assessment and Plan  Patient Details  Name: Donald [name] MRN: [id number] Date of Birth: [birth year]  PT Diagnosis: Abnormal posture, Abnormality of gait, Coordination disorder, Hemiparesis non-dominant, Impaired sensation, Muscle weakness and Pain in surgical pain Rehab Potential: Good ELOS: 7-10 days   Today's Date: 01/14/2013 Time: 8413-2440 Time Calculation (min): 60 min  Problem List:  Patient Active Problem List   Diagnosis Date Noted  . PVD (peripheral vascular disease) with claudication 01/14/2013  . Hemiparesis affecting left side as late effect of stroke 01/05/2013  . Abnormality of gait 09/03/2012  . Other acquired deformity of ankle and foot(736.79) 09/03/2012  . Aftercare following surgery of the circulatory system, NEC 05/15/2012  . 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 Peripheral vascular disease, unspecified 03/06/2012  . PVD (peripheral vascular disease) 02/21/2012  . Occlusion and stenosis of carotid artery without mention of cerebral infarction 02/21/2012  . Physical deconditioning 01/31/2012  . Atherosclerotic peripheral vascular disease with rest pain 01/24/2012  . Preoperative evaluation to rule out surgical contraindication 01/24/2012  . Anal carcinoma 10/30/2011  . Anal cancer 02/26/2011  . SEC MALIGNANT NEOPLASM OF LARGE INTESTINE&RECTUM 11/09/2008  . NAUSEA AND VOMITING 11/09/2008  . RECTAL MASS 08/30/2008  . CONSTIPATION 08/18/2008  . RECTAL BLEEDING 08/18/2008  . FLATULENCE-GAS-BLOATING 08/18/2008  . ABDOMINAL PAIN -GENERALIZED 08/18/2008  . DM 08/17/2008  . HYPERLIPIDEMIA 08/17/2008  . HEMORRHOIDS 08/17/2008  . CONSTIPATION, CHRONIC 08/17/2008  . CEREBROVASCULAR ACCIDENT, HX OF 08/17/2008    Past Medical History:  Past Medical History  Diagnosis Date  . GERD (gastroesophageal reflux disease)   . Hyperlipidemia   . Hypertension   . Peripheral vascular disease   .  Type II diabetes mellitus   . Pneumonia   . Internal and external bleeding hemorrhoids   . Stroke 2008    "left side weak; unable to move left hand still" (01/23/2012)  . History of gout     left great toe  . anal ca dx'd 08/2007    S/P chemo, radiation, biopsies  . 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 Depression   . Numbness in right leg     Hx: of diabetes  . Coronary artery disease     RCA occlusion with good collaterals, o/w no obs CAD 12/2011   Past Surgical History:  Past Surgical History  Procedure Laterality Date  . Carotid endarterectomy  2008    right  . Liver cryoablation  08/2010    "chemo shrunk the cancer 50% then they went in and burned the rest of it"  . Mass biopsy      rectal; "found cancer"  . Axillary-femoral bypass graft  01/29/2012    Procedure: BYPASS GRAFT AXILLA-BIFEMORAL;  Surgeon: Sherren Kerns, MD;  Location: [facility] OR;  Service: Vascular;  Laterality: N/A;  Left axilla-bifemoral bypass using gore-tex graft.   . Colonoscopy w/ biopsies and polypectomy      Hx: of  . Femoral-popliteal bypass graft Left 01/06/2013    Procedure: Femoral-Popliteal Artery Bypass;  Surgeon: Sherren Kerns, MD;  Location: [facility] OR;  Service: Vascular;  Laterality: Left;  . Intraoperative arteriogram Left 01/06/2013    Procedure: INTRA OPERATIVE ARTERIOGRAM;  Surgeon: Sherren Kerns, MD;  Location: [facility] OR;  Service: Vascular;  Laterality: Left;  . Femoral-popliteal bypass graft Left 01/10/2013    Procedure: THROMBECTOMY POSSIBLE BYPASS GRAFT FEMORAL-POPLITEAL ARTERY;  Surgeon: Pryor Ochoa, MD;  Location: [facility] OR;  Service: Vascular;  Laterality: Left;  Attempted Thrombectomy of left Femoral/Popliteal graft - Unsuccessful.  Insertion of Left femoral to below the knee popliteal propaten graft.    Assessment & Plan Clinical Impression: Donald [name] is a 67 y.o. right-handed male well known to rehabilitation services from admission October 2013 for deconditioning related to  multiple medical issues, peripheral vascular disease status post axillary bifemoral bypass 01/29/2012 as well as CVA with spastic left hemiparesis and maintained on Aggrenox therapy and tobacco abuse. Admitted 01/06/2013 with a new blister tip of his left great toe and increased rest pain. CT angioplasty lower extremity show critical stenosis left common femoral artery just distal to the femoral bypass graft. Underwent a femoral to below knee popliteal artery bypass with patch angioplasty left common femoral artery 01/06/2013 per Dr. Darrick Penna. A intraoperative single static arteriogram image showed patent bypass graft with three-vessel runoff. Acute blood loss anemia 8.0 and monitored. Followup ABI studies indicate a moderate reduction in arterial flow on the right and a mild reduction on the left. CT angiography of chest, abdomen and pelvis with iliofemoral runoff 01/10/2013 to evaluate axillary and subclavian arteries showed occluded left femoral-popliteal saphenous vein graft and underwent redo left femoral-popliteal bypass graft 01/10/2013 per Dr. Hart [place]. Placed on subcutaneous Lovenox for DVT prophylaxis 01/11/2013. Patient remains on Aggrenox for history of CVA. Physical occupational therapy evaluations completed an ongoing was noted deconditioning. M.D. as requested physical medicine rehabilitation consult to consider inpatient rehabilitation services. Patient was felt to be a good candidate for inpatient rehabilitation services and was admitted for a comprehensive rehabilitation program. Patient transferred to CIR on 01/13/2013 .   Patient currently requires min with mobility secondary to muscle weakness, decreased cardiorespiratoy endurance, impaired timing and sequencing, abnormal tone, unbalanced muscle activation and decreased coordination, na and decreased standing balance, decreased postural control, hemiplegia and decreased balance strategies.  Prior to hospitalization, patient was modified independent   with mobility and lived with Spouse in a House home.  Home access is 16 in front of house, 1 step in back entrance (which patient reports they use more often)Stairs to enter.  Patient will benefit from skilled PT intervention to maximize safe functional mobility, minimize fall risk and decrease caregiver burden for planned discharge home with 24 hour supervision.  Anticipate patient will benefit from follow up HH at discharge.  PT - End of Session Activity Tolerance: Tolerates 30+ min activity with multiple rests Endurance Deficit: Yes Endurance Deficit Description: Fatigues quickly and requires frequent rest breaks. PT Assessment Rehab Potential: Good PT Patient demonstrates impairments in the following area(s): Balance;Endurance;Motor;Pain;Sensory;Safety;Skin Integrity PT Transfers Functional Problem(s): Bed Mobility;Bed to Chair;Car;Furniture PT Locomotion Functional Problem(s): Ambulation;Wheelchair Mobility;Stairs PT Plan PT Intensity: Minimum of 1-2 x/day ,45 to 90 minutes PT Frequency: 5 out of 7 days PT Duration Estimated Length of Stay: 7-10 days PT Treatment/Interventions: Ambulation/gait training;Balance/vestibular training;Community reintegration;Discharge planning;Neuromuscular re-education;Functional mobility training;DME/adaptive equipment instruction;Pain management;Patient/family education;Psychosocial support;Splinting/orthotics;UE/LE Coordination activities;UE/LE Strength taining/ROM;Therapeutic Exercise;Therapeutic Activities;Wheelchair propulsion/positioning;Stair training;Skin care/wound management;Disease management/prevention PT Transfers Anticipated Outcome(s): Supervision with LRAD PT Locomotion Anticipated Outcome(s): Ambulation 150' with LRAD and supervision, 1 stair with one handrail and supervision PT Recommendation Follow Up Recommendations: Home health PT Patient destination: Home Equipment Recommended: Rolling walker with 5" wheels Equipment Details: PTA,  patient used SPC, but anticipate patient will require use of RW  Skilled Therapeutic Intervention Skilled therapeutic intervention initiated after completion of evaluation. Wheelchair provided with regular cushion for patient comfort. Patient left seated in wheelchair with all needs within reach. Patient verbalized that he is aware to call  for assistance and not get up on his own.  PT Evaluation Precautions/Restrictions Precautions Precautions: Fall Precaution Comments: residual weakness L side-old CVA Restrictions Weight Bearing Restrictions: No General Chart Reviewed: Yes Family/Caregiver Present: No  Pain Pain Assessment Pain Assessment: 0-10 Pain Score: 6  Pain Type: Surgical pain Home Living/Prior Functioning Home Living Available Help at Discharge: Family (wife and sister in law can provide help as well) Type of Home: House Home Access: Stairs to enter [employer] of Steps: 16 in front of house, 1 step in back entrance (which patient reports they use more often) Entrance Stairs-Rails: Right;Left Home Layout: Two level;Bed/bath upstairs Alternate Level Stairs-Number of Steps: flight; patient has chair lift Alternate Level Stairs-Rails: Right;Left Additional Comments: Pt has a stair chair lift now for going upstairs.   Lives With: Spouse Prior Function Level of Independence: Requires assistive device for independence;Needs assistance with ADLs  Able to Take Stairs?: No Driving: No Vocation: Retired Leisure:  (likes to eat out, church when able) Comments: used cane PTA Vision/Perception  Vision - History Baseline Vision: Wears glasses all the time Patient Visual Report: No change from baseline  Cognition Overall Cognitive Status: Within Functional Limits for tasks assessed Arousal/Alertness: Awake/alert Orientation Level: Oriented X4 Awareness: Appears intact Problem Solving: Appears intact Safety/Judgment: Appears intact Sensation Sensation Light  Touch: Impaired by gross assessment Proprioception: Appears Intact Additional Comments: Sensation diminished dorsal aspect of B feet Coordination Gross Motor Movements are Fluid and Coordinated: No Fine Motor Movements are Fluid and Coordinated: No Heel Shin Test: Slight dysmetria noted L LE-residual deficits from old CVA Motor  Motor Motor: Abnormal tone;Abnormal postural alignment and control;Hemiplegia Motor - Skilled Clinical Observations: L UE spasticity from old CVA  Mobility Bed Mobility Bed Mobility: Not assessed Transfers Transfers: Yes Sit to Stand: 4: Min assist;With upper extremity assist;With armrests;From chair/3-in-1;From bed Sit to Stand Details: Verbal cues for sequencing;Verbal cues for technique;Verbal cues for precautions/safety Stand to Sit: 4: Min assist;With upper extremity assist;With armrests;To chair/3-in-1;To bed Stand to Sit Details (indicate cue type and reason): Verbal cues for sequencing;Verbal cues for technique;Verbal cues for precautions/safety Stand Pivot Transfers: 4: Min assist;With armrests Stand Pivot Transfer Details: Verbal cues for sequencing;Verbal cues for technique;Verbal cues for precautions/safety Locomotion  Ambulation Ambulation: Yes Ambulation/Gait Assistance: 4: Min assist Ambulation Distance (Feet): 75 Feet Assistive device: Rolling walker (with L hand orthosis) Ambulation/Gait Assistance Details: Verbal cues for precautions/safety;Verbal cues for safe use of DME/AE;Tactile cues for posture;Tactile cues for weight shifting Ambulation/Gait Assistance Details: Patient performed gait training 66' x1 w/ RW (with L hand orthosis) in controlled environment. Gait deviations from residual CVA apparent, but patient reports gait is close to baseline. Gait Gait: Yes Gait Pattern: Step-to pattern;Decreased step length - left;Decreased weight shift to left;Shuffle;Trunk flexed;Wide base of support;Left steppage;Left circumduction;Decreased  dorsiflexion - left Stairs / Additional Locomotion Stairs: Yes Stairs Assistance: 4: Min assist Stairs Assistance Details: Verbal cues for sequencing;Verbal cues for technique;Verbal cues for precautions/safety Stairs Assistance Details (indicate cue type and reason): Requires verbal cues for proper sequencing to ascend with R LE and descend with L LE. Stair Management Technique: One rail Right;Step to pattern;Forwards Number of Stairs: 3 Height of Stairs: 5 Corporate treasurer: Yes Wheelchair Assistance: 5: Financial planner Details: Verbal cues for Engineer, drilling: Right upper extremity;Right lower extremity Wheelchair Parts Management: Needs assistance Distance: 150  Trunk/Postural Assessment  Cervical Assessment Cervical Assessment: Exceptions to [facility] (forward head posture) Thoracic Assessment Thoracic Assessment: Within Functional Limits Lumbar Assessment Lumbar Assessment:  Within Functional Limits Postural Control Postural Control: Deficits on evaluation (residual deficits from old CVA)  Balance Balance Balance Assessed: Yes Static Sitting Balance Static Sitting - Balance Support: No upper extremity supported;Feet supported Static Sitting - Level of Assistance: 6: Modified independent (Device/Increase time) Static Standing Balance Static Standing - Balance Support: Bilateral upper extremity supported;During functional activity Static Standing - Level of Assistance: 4: Min assist Static Standing - Comment/# of Minutes: 2 Extremity Assessment  RLE Assessment RLE Assessment: Within Functional Limits (Grossly 4/5) LLE Assessment LLE Assessment: Exceptions to [facility] LLE Strength LLE Overall Strength: Deficits;Due to pain;Due to premorbid status LLE Overall Strength Comments: Grossly 2+/5 secondary to residual deficits from old CVA; Formal MMT not performed seconary to pain from incision site  FIM:  FIM - Bed/Chair  Transfer Bed/Chair Transfer: 4: Chair or W/C > Bed: Min A (steadying Pt. > 75%);4: Bed > Chair or W/C: Min A (steadying Pt. > 75%) FIM - Locomotion: Wheelchair Distance: 150 Locomotion: Wheelchair: 5: Travels 150 ft or more: maneuvers on rugs and over door sills with supervision, cueing or coaxing FIM - Locomotion: Ambulation Locomotion: Ambulation Assistive Devices: Walker - Rolling;Orthosis (L hand orthosis) Ambulation/Gait Assistance: 4: Min assist Locomotion: Ambulation: 2: Travels 50 - 149 ft with minimal assistance (Pt.>75%) FIM - Locomotion: Stairs Locomotion: Building control surveyor: Hand rail - 1 Locomotion: Stairs: 1: Up and Down < 4 stairs with minimal assistance (Pt.>75%)   Refer to Care Plan for Long Term Goals  Recommendations for other services: None  Discharge Criteria: Patient will be discharged from PT if patient refuses treatment 3 consecutive times without medical reason, if treatment goals not met, if there is a change in medical status, if patient makes no progress towards goals or if patient is discharged from hospital.  The above assessment, treatment plan, treatment alternatives and goals were discussed and mutually agreed upon: by patient  Chipper Herb. Chloey Ricard, PT, DPT 01/14/2013, 11:05 AM

## 2013-01-14 NOTE — Progress Notes (Signed)
Subjective/Complaints: Did fairly well last night. Was up once to empty his bowels. Pain under control. A 12 point review of systems has been performed and if not noted above is otherwise negative.   Objective: Vital Signs: Blood pressure 161/77, pulse 88, temperature 97.8 F (36.6 C), temperature source Oral, resp. rate 18, height 5' 8.9" (1.75 m), weight 90.3 kg (199 lb 1.2 oz), SpO2 98.00%. No results found.  Recent Labs  01/13/13 1835 01/14/13 0530  WBC 7.4 7.2  HGB 8.4* 8.4*  HCT 25.6* 25.9*  PLT 264 321    Recent Labs  01/12/13 0500 01/13/13 1835 01/14/13 0530  NA 137  --  137  K 3.7  --  3.8  CL 104  --  102  GLUCOSE 148*  --  150*  BUN 9  --  10  CREATININE 1.05 1.14 1.02  CALCIUM 8.0*  --  8.7   CBG (last 3)   Recent Labs  01/13/13 1140 01/13/13 1628 01/13/13 2043  GLUCAP 124* 112* 135*    Wt Readings from Last 3 Encounters:  01/13/13 90.3 kg (199 lb 1.2 oz)  01/06/13 90.3 kg (199 lb 1.2 oz)  01/06/13 90.3 kg (199 lb 1.2 oz)    Physical Exam:  Constitutional: He is oriented to person, place, and time. No distress  HENT:  Head: Normocephalic. Tongue pink,moist, dentition good  Eyes: EOM are normal.  Neck: Normal range of motion. Neck supple. No thyromegaly present.  Cardiovascular: Normal rate and irregular rhythm. No murmur  Pulmonary/Chest: Effort normal and breath sounds normal. No respiratory distress. No wheezes  Abdominal: Soft. Bowel sounds are normal. He exhibits no distension. There is no tenderness.  Neurological: He is alert and oriented to person, place, and time.  Musc: left leg with 1++ edema, tender along incision. Scrotal edema is decreased  Left hemiparesis, sensory deficits. LUE is 1-2/5 bicep, tricep, deltoid, HI and wrist are 1 to 1+. LUE with apraxia, inhibited by tone which was variable, 2-3/4. LLE is grossly 1-2/5 inhibited by pain/swelling in left leg. Tone is 1+. Left central 7 with good tongue control. Speech clear.  Cognitively with good insight and awareness.  Skin: Bypass graft site clean and dry with some surrounding erythema. Dry dressing to left first toe  , patient with chronic vascular changes in either leg   Assessment/Plan: 1. Functional deficits secondary to deconditioning, PVD requiring LLE BPG  which require 3+ hours per day of interdisciplinary therapy in a comprehensive inpatient rehab setting. Physiatrist is providing close team supervision and 24 hour management of active medical problems listed below. Physiatrist and rehab team continue to assess barriers to discharge/monitor patient progress toward functional and medical goals. FIM:                   Comprehension Comprehension Mode: Auditory Comprehension: 6-Follows complex conversation/direction: With extra time/assistive device  Expression Expression Mode: Verbal Expression: 6-Expresses complex ideas: With extra time/assistive device  Social Interaction Social Interaction: 6-Interacts appropriately with others with medication or extra time (anti-anxiety, antidepressant).  Problem Solving Problem Solving: 6-Solves complex problems: With extra time  Memory Memory: 6-More than reasonable amt of time  Medical Problem List and Plan:  1. Severe peripheral artery disease status post left F. 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 DVT Prophylaxis/Anticoagulation: Subcutaneous Lovenox. Monitor platelet counts and any signs of bleeding  3. Pain Management: Neurontin 300 mg 3 times a day, Percocet as  needed. Monitor with increased mobility  4. Neuropsych: This patient is capable of making decisions on his own behalf.  5. Acute blood loss anemia. Followup hgb 8.4, no signs of further blood loss at present 6. Hypertension. Lisinopril 2.5 mg daily. Monitor with increased mobility  7. Diabetes mellitus with peripheral neuropathy. Hemoglobin A1c 7.4.  Check blood sugars a.c. and at bedtime  8. Tobacco abuse. Continue Zyban and provide counseling  9. Hyperlipidemia. Lovaza  10. GERD. Protonix   LOS (Days) 1 A FACE TO FACE EVALUATION WAS PERFORMED  Donald [name] T 01/14/2013 7:45 AM

## 2013-01-15 ENCOUNTER — Inpatient Hospital Stay (HOSPITAL_COMMUNITY): Payer: Medicare Other | Admitting: Physical Therapy

## 2013-01-15 ENCOUNTER — Inpatient Hospital Stay (HOSPITAL_COMMUNITY): Payer: Medicare Other

## 2013-01-15 LAB — GLUCOSE, CAPILLARY: Glucose-Capillary: 146 mg/dL — ABNORMAL HIGH (ref 70–99)

## 2013-01-15 NOTE — Progress Notes (Signed)
Occupational Therapy Session Note  Patient Details  Name: Donald [name] MRN: [id number] Date of Birth: [birth year]  Today's Date: 01/15/2013 Time: 1000-1055 Time Calculation (min): 55 min  Short Term Goals: Week 1:  OT Short Term Goal 1 (Week 1): STG=LTGs  Skilled Therapeutic Interventions/Progress Updates:    Pt seated in w/c upon arrival.  Pt explained that he had already washed his face and everything else when he used the bathroom with nursing.  Pt continued to explain that he put on clean clothes when PT came for their session.  Pt stated he wanted to walk some with his SPC.  Pt transitioned to ADL apartment to practice amb with RW and SPC for home mgmt tasks and kitchen activities.  Pt required min A in apartment and in hallway.  Pt exhibited no unsafe behaviors in ADL apartment.  Pt amb with SPC in hallway approx 50' before fatigue.  Pt transitioned back to room with wife present.  Therapy Documentation Precautions:  Precautions Precautions: Fall Precaution Comments: residual weakness L side-old CVA Restrictions Weight Bearing Restrictions: No Pain: Pain Assessment Pain Assessment: No/denies pain See FIM for current functional status  Therapy/Group: Individual Therapy  Rich Brave 01/15/2013, 10:57 AM

## 2013-01-15 NOTE — Progress Notes (Signed)
Physical Therapy Session Note  Patient Details  Name: Donald [name] MRN: [id number] Date of Birth: [birth year]  Today's Date: 01/15/2013 Time: 0730-0830 Time Calculation (min): 60 min  Short Term Goals: Week 1:  PT Short Term Goal 1 (Week 1): STGs=LTGs  Skilled Therapeutic Interventions/Progress Updates:    Pt sitting in chair at start. TEDS donned, clothing donned with assist (not focus of treatment). Min assist for sit <> stands throughout treatment. Wheelchair propulsion to/from gym x 200' for activity tolerance, needs one rest break each direction. Ambulation with RW x 50' min assist and cues needed to square fully prior to sitting and to remove Lt. Hand from orthosis prior to sitting. Obstacle course: navigating cones with RW and 6 steps with Rt. Railing, min-guard/min assist. Dynamic reaching balance activity without UE support, min assist for posterior loss of balance particularly when returning back to normal stand post reaching.   Therapy Documentation Precautions:  Precautions Precautions: Fall Precaution Comments: residual weakness L side-old CVA Restrictions Weight Bearing Restrictions: No Pain: Pain Assessment Pain Assessment: No/denies pain Pain Score: 0-No pain   See FIM for current functional status  Therapy/Group: Individual Therapy  Wilhemina Bonito 01/15/2013, 12:26 PM

## 2013-01-15 NOTE — Progress Notes (Signed)
Occupational Therapy Note  Patient Details  Name: Donald [name] MRN: [id number] Date of Birth: 1946-01-31 Today's Date: 01/15/2013  Time: 1345-1430 Pt c/o 5/10 pain in LLE; RN aware Individual Therapy  Pt engaged in functional amb with RW and SPC to practice stepping over into shower stall, reaching for objects outside of BOS, retrieving items from floor with reacher, and transporting items with RW to place on shelf.  Pt required multiple rest breaks appropriately throughout session.  Pt required min A for all ambulation and dynamic standing tasks.   Lavone Neri [facility] 01/15/2013, 3:34 PM

## 2013-01-15 NOTE — Progress Notes (Signed)
Subjective/Complaints: Good night. Very positive about his progress thus far.  A 12 point review of systems has been performed and if not noted above is otherwise negative.   Objective: Vital Signs: Blood pressure 144/45, pulse 76, temperature 98.3 F (36.8 C), temperature source Oral, resp. rate 18, height 5' 8.9" (1.75 m), weight 82.9 kg (182 lb 12.2 oz), SpO2 97.00%. No results found.  Recent Labs  01/13/13 1835 01/14/13 0530  WBC 7.4 7.2  HGB 8.4* 8.4*  HCT 25.6* 25.9*  PLT 264 321    Recent Labs  01/13/13 1835 01/14/13 0530  NA  --  137  K  --  3.8  CL  --  102  GLUCOSE  --  150*  BUN  --  10  CREATININE 1.14 1.02  CALCIUM  --  8.7   CBG (last 3)   Recent Labs  01/14/13 1641 01/14/13 2030 01/15/13 0718  GLUCAP 117* 138* 143*    Wt Readings from Last 3 Encounters:  01/14/13 82.9 kg (182 lb 12.2 oz)  01/06/13 90.3 kg (199 lb 1.2 oz)  01/06/13 90.3 kg (199 lb 1.2 oz)    Physical Exam:  Constitutional: He is oriented to person, place, and time. No distress  HENT:  Head: Normocephalic. Tongue pink,moist, dentition good  Eyes: EOM are normal.  Neck: Normal range of motion. Neck supple. No thyromegaly present.  Cardiovascular: Normal rate and irregular rhythm. No murmur  Pulmonary/Chest: Effort normal and breath sounds normal. No respiratory distress. No wheezes  Abdominal: Soft. Bowel sounds are normal. He exhibits no distension. There is no tenderness.  Neurological: He is alert and oriented to person, place, and time.  Musc: left leg with 1++ edema, tender along incision. Scrotal edema is decreased  Left hemiparesis, sensory deficits. LUE is 1-2/5 bicep, tricep, deltoid, HI and wrist are 1 to 1+. LUE with apraxia, inhibited by tone which was variable, 2-3/4. LLE is grossly 1-2/5 inhibited by pain/swelling in left leg. Tone is 1+. Left central 7 with good tongue control. Speech clear. Cognitively with good insight and awareness.  Skin: Bypass graft site  clean and dry with some surrounding erythema. Dry dressing to left first toe   patient with chronic vascular changes in either leg   Assessment/Plan: 1. Functional deficits secondary to deconditioning, PVD requiring LLE BPG  which require 3+ hours per day of interdisciplinary therapy in a comprehensive inpatient rehab setting. Physiatrist is providing close team supervision and 24 hour management of active medical problems listed below. Physiatrist and rehab team continue to assess barriers to discharge/monitor patient progress toward functional and medical goals. FIM: FIM - Bathing Bathing Steps Patient Completed: Chest;Left Arm;Abdomen (declined full bath with OT) Bathing: 3: Mod-Patient completes 5-7 55f 10 parts or 50-74% (Patient reports nursing staff assisted with peri areas)  FIM - Upper Body Dressing/Undressing Upper body dressing/undressing steps patient completed: Thread/unthread right sleeve of pullover shirt/dresss;Thread/unthread left sleeve of pullover shirt/dress;Put head through opening of pull over shirt/dress;Pull shirt over trunk Upper body dressing/undressing: 5: Set-up assist to: Obtain clothing/put away FIM - Lower Body Dressing/Undressing Lower body dressing/undressing steps patient completed: Don/Doff right sock;Don/Doff right shoe;Fasten/unfasten right shoe Lower body dressing/undressing: 2: Max-Patient completed 25-49% of tasks        FIM - Bed/Chair Transfer Bed/Chair Transfer: 4: Chair or W/C > Bed: Min A (steadying Pt. > 75%);4: Bed > Chair or W/C: Min A (steadying Pt. > 75%)  FIM - Locomotion: Wheelchair Distance: 150 Locomotion: Wheelchair: 5: Travels 150 ft or more:  maneuvers on rugs and over door sills with supervision, cueing or coaxing FIM - Locomotion: Ambulation Locomotion: Ambulation Assistive Devices: Walker - Rolling;Orthosis (L hand orthosis) Ambulation/Gait Assistance: 4: Min assist Locomotion: Ambulation: 2: Travels 50 - 149 ft with minimal  assistance (Pt.>75%)  Comprehension Comprehension Mode: Auditory Comprehension: 6-Follows complex conversation/direction: With extra time/assistive device  Expression Expression Mode: Verbal Expression: 6-Expresses complex ideas: With extra time/assistive device  Social Interaction Social Interaction: 6-Interacts appropriately with others with medication or extra time (anti-anxiety, antidepressant).  Problem Solving Problem Solving: 6-Solves complex problems: With extra time  Memory Memory: 6-More than reasonable amt of time  Medical Problem List and Plan:  1. Severe peripheral artery disease status post left F. 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 DVT Prophylaxis/Anticoagulation: Subcutaneous Lovenox. Monitor platelet counts and any signs of bleeding  3. Pain Management: Neurontin 300 mg 3 times a day, Percocet as needed. Monitor with increased mobility  4. Neuropsych: This patient is capable of making decisions on his own behalf.  5. Acute blood loss anemia. Followup hgb 8.4, no signs of further blood loss at present 6. Hypertension. Lisinopril 2.5 mg daily. Monitor with increased mobility  7. Diabetes mellitus with peripheral neuropathy. Hemoglobin A1c 7.4. Check blood sugars a.c. and at bedtime  8. Tobacco abuse. Continue Zyban and provide counseling  9. Hyperlipidemia. Lovaza  10. GERD. Protonix   LOS (Days) 2 A FACE TO FACE EVALUATION WAS PERFORMED  Donald [name] T 01/15/2013 8:18 AM

## 2013-01-15 NOTE — Progress Notes (Signed)
Physical Therapy Session Note  Patient Details  Name: Donald [name] MRN: [id number] Date of Birth: 09/08/1945  Today's Date: 01/15/2013 Time: 1300-1330 Time Calculation (min): 30 min  Short Term Goals: Week 1:  PT Short Term Goal 1 (Week 1): STGs=LTGs  Skilled Therapeutic Interventions/Progress Updates:    Pt received sitting in w/c. Performed sit to stand with and without walker x 5 each, wheelchair mobility hemi technique with min assist, gait training with RW with orthotic on L side of RW for 75 feet, pt reported suddenly the urge to have a bowel movement and was unable to make it to bathroom displaying incontinence of bowel requiring +2 to clean and donn new clothing and brief.   Therapy Documentation Precautions:  Precautions Precautions: Fall Precaution Comments: residual weakness L side-old CVA Restrictions Weight Bearing Restrictions: No    Pain: Pain Assessment Pain Assessment: 0-10 Pain Score: 0-No pain Faces Pain Scale: No hurt Pain Type: Acute pain Pain Location: Leg Pain Orientation: Left Pain Descriptors / Indicators: Aching Pain Frequency: Intermittent Pain Onset: Gradual Patients Stated Pain Goal: 2 Pain Intervention(s): Medication (See eMAR)    Locomotion : Ambulation Ambulation/Gait Assistance: 4: Min assist              See FIM for current functional status  Therapy/Group: Individual Therapy  Jackelyn Knife 01/15/2013, 4:25 PM

## 2013-01-15 NOTE — IPOC Note (Signed)
Overall Plan of Care [facility]) Patient Details Name: Donald [name] MRN: [id number] DOB: 1945/08/13  Admitting Diagnosis: deconditioned l fpbpa  Hospital Problems: Principal Problem:   PVD (peripheral vascular disease) with claudication     Functional Problem List: Nursing Pain;Safety;Skin Integrity;Bowel  PT Balance;Endurance;Motor;Pain;Sensory;Safety;Skin Integrity  OT Balance;Cognition;Edema;Endurance;Pain;Safety  SLP    TR         Basic ADL's: OT Bathing;Dressing;Toileting;Grooming     Advanced  ADL's: OT       Transfers: PT Bed Mobility;Bed to Chair;Car;Furniture  OT Toilet;Tub/Shower     Locomotion: PT Ambulation;Wheelchair Mobility;Stairs     Additional Impairments: OT    SLP        TR      Anticipated Outcomes Item Anticipated Outcome  Self Feeding    Swallowing      Basic self-care  Overall Supervision-Min assist  Toileting  Supervision   Bathroom Transfers Supervision toilet, Min assist walk in shower with chair  Bowel/Bladder  Pt continent of bowel and bladder  Transfers  Supervision with LRAD  Locomotion  Ambulation 150' with LRAD and supervision, 1 stair with one handrail and supervision  Communication     Cognition     Pain  Pt will rate pain less than 3 on scale of 0-10  Safety/Judgment  Pt will remain free from falls during admission   Therapy Plan: PT Intensity: Minimum of 1-2 x/day ,45 to 90 minutes PT Frequency: 5 out of 7 days PT Duration Estimated Length of Stay: 7-10 days OT Intensity: Minimum of 1-2 x/day, 45 to 90 minutes OT Frequency: 5 out of 7 days OT Duration/Estimated Length of Stay: 7 days         Team Interventions: Nursing Interventions Bladder Management;Bowel Management;Skin Care/Wound Management;Psychosocial Support;Pain Management  PT interventions Ambulation/gait training;Balance/vestibular training;Community reintegration;Discharge planning;Neuromuscular re-education;Functional mobility  training;DME/adaptive equipment instruction;Pain management;Patient/family education;Psychosocial support;Splinting/orthotics;UE/LE Coordination activities;UE/LE Strength taining/ROM;Therapeutic Exercise;Therapeutic Activities;Wheelchair propulsion/positioning;Stair training;Skin care/wound management;Disease management/prevention  OT Interventions Social worker;Discharge planning;Functional mobility training;Psychosocial support;Patient/family education;Pain management;Self Care/advanced ADL retraining;Therapeutic Activities;Therapeutic Exercise;UE/LE Coordination activities  SLP Interventions    TR Interventions    SW/CM Interventions Discharge Planning;Psychosocial Support;Patient/Family Education    Team Discharge Planning: Destination: PT-Home ,OT- Home , SLP-  Projected Follow-up: PT-Home health PT, OT-  Home health OT, SLP-  Projected Equipment Needs: PT-Rolling walker with 5" wheels, OT- None recommended by OT, SLP-  Patient/family involved in discharge planning: PT- Patient,  OT-Family member/caregiver;Patient, SLP-   MD ELOS: 7 days Medical Rehab Prognosis:  Excellent Assessment: The patient has been admitted for CIR therapies. The team will be addressing, functional mobility, strength, stamina, balance, safety, adaptive techniques/equipment, self-care, bowel and bladder mgt, patient and caregiver education, pain mgt, NMR. Goals have been set at supervision to occasional minimal assist.    Ranelle Oyster, MD, [facility]      See Team Conference Notes for weekly updates to the plan of care

## 2013-01-15 NOTE — Plan of Care (Signed)
Problem: RH PAIN MANAGEMENT Goal: RH STG PAIN MANAGED AT OR BELOW PT'S PAIN GOAL Pt will rate pain less than 3 on scale of 0-10 with MIN assist  Outcome: Progressing Managed with prn medication

## 2013-01-15 NOTE — Progress Notes (Signed)
Social Work  Social Work Assessment and Plan  Patient Details  Name: Donald [name] MRN: [id number] Date of Birth: [birth year]  Today's Date: 01/15/2013  Problem List:  Patient Active Problem List   Diagnosis Date Noted  . PVD (peripheral vascular disease) with claudication 01/14/2013  . Hemiparesis affecting left side as late effect of stroke 01/05/2013  . Abnormality of gait 09/03/2012  . Other acquired deformity of ankle and foot(736.79) 09/03/2012  . Aftercare following surgery of the circulatory system, NEC 05/15/2012  . 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 Peripheral vascular disease, unspecified 03/06/2012  . PVD (peripheral vascular disease) 02/21/2012  . Occlusion and stenosis of carotid artery without mention of cerebral infarction 02/21/2012  . Physical deconditioning 01/31/2012  . Atherosclerotic peripheral vascular disease with rest pain 01/24/2012  . Preoperative evaluation to rule out surgical contraindication 01/24/2012  . Anal carcinoma 10/30/2011  . Anal cancer 02/26/2011  . SEC MALIGNANT NEOPLASM OF LARGE INTESTINE&RECTUM 11/09/2008  . NAUSEA AND VOMITING 11/09/2008  . RECTAL MASS 08/30/2008  . CONSTIPATION 08/18/2008  . RECTAL BLEEDING 08/18/2008  . FLATULENCE-GAS-BLOATING 08/18/2008  . ABDOMINAL PAIN -GENERALIZED 08/18/2008  . DM 08/17/2008  . HYPERLIPIDEMIA 08/17/2008  . HEMORRHOIDS 08/17/2008  . CONSTIPATION, CHRONIC 08/17/2008  . CEREBROVASCULAR ACCIDENT, HX OF 08/17/2008   Past Medical History:  Past Medical History  Diagnosis Date  . GERD (gastroesophageal reflux disease)   . Hyperlipidemia   . Hypertension   . Peripheral vascular disease   . Type II diabetes mellitus   . Pneumonia   . Internal and external bleeding hemorrhoids   . Stroke 2008    "left side weak; unable to move left hand still" (01/23/2012)  . History of gout     left great toe  . anal ca dx'd 08/2007    S/P chemo,  radiation, biopsies  . 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 Depression   . Numbness in right leg     Hx: of diabetes  . Coronary artery disease     RCA occlusion with good collaterals, o/w no obs CAD 12/2011   Past Surgical History:  Past Surgical History  Procedure Laterality Date  . Carotid endarterectomy  2008    right  . Liver cryoablation  08/2010    "chemo shrunk the cancer 50% then they went in and burned the rest of it"  . Mass biopsy      rectal; "found cancer"  . Axillary-femoral bypass graft  01/29/2012    Procedure: BYPASS GRAFT AXILLA-BIFEMORAL;  Surgeon: Sherren Kerns, MD;  Location: [facility] OR;  Service: Vascular;  Laterality: N/A;  Left axilla-bifemoral bypass using gore-tex graft.   . Colonoscopy w/ biopsies and polypectomy      Hx: of  . Femoral-popliteal bypass graft Left 01/06/2013    Procedure: Femoral-Popliteal Artery Bypass;  Surgeon: Sherren Kerns, MD;  Location: [facility] OR;  Service: Vascular;  Laterality: Left;  . Intraoperative arteriogram Left 01/06/2013    Procedure: INTRA OPERATIVE ARTERIOGRAM;  Surgeon: Sherren Kerns, MD;  Location[facility] OR;  Service: Vascular;  Laterality: Left;  . Femoral-popliteal bypass graft Left 01/10/2013    Procedure: THROMBECTOMY POSSIBLE BYPASS GRAFT FEMORAL-POPLITEAL ARTERY;  Surgeon: Pryor Ochoa, MD;  Location: [facility] OR;  Service: Vascular;  Laterality: Left;  Attempted Thrombectomy of left Femoral/Popliteal graft - Unsuccessful.  Insertion of Left femoral to below the knee popliteal propaten graft.   Social History:  reports that he has been smoking Cigarettes.  He has a 5 pack-year smoking history. He has never used smokeless tobacco. He reports that he does not drink alcohol or use illicit drugs.  Family / Support Systems Marital Status: Married Patient Roles: Spouse Spouse/Significant Other: wife, Gabriella Guile @ [phone] Children: None Other Supports: None Anticipated Caregiver: wife Ability/Limitations of Caregiver: Wife is  retired and can assist.  Sister is currently staying with them. Caregiver Availability: 24/7 Family Dynamics: wife very attentive and supportive - denies any concerns about providing assistance at d/c  Social History Preferred language: English Religion: [employer] Cultural Background: NA Education: college Read: Yes Write: Yes Employment Status: Retired Fish farm manager Issues: None - pt make decisions on his own behalf Guardian/Conservator: None   Abuse/Neglect Physical Abuse: Denies Verbal Abuse: Denies Sexual Abuse: Denies Exploitation of patient/patient's resources: Denies Self-Neglect: Denies  Emotional Status Pt's affect, behavior adn adjustment status: Pt pleasant, oriented, talkative and very eager to d/c home ASAP.  Feels he is "... doing better this time around (on CIR) than last..Marland KitchenI should be ready sooner."  Denies any emotional distress - will monitor. Recent Psychosocial Issues: None Pyschiatric History: None Substance Abuse History: None  Patient / Family Perceptions, Expectations & Goals Pt/Family understanding of illness & functional limitations: pt and wife with basic understanding of bypass surgery performed and current functional limitations due to deconditioning.   Premorbid pt/family roles/activities: Pt was independent PTA and out in community daily.  Wife and pt sharing responsibilities for household Anticipated changes in roles/activities/participation: little change anticipated on the longer term.  Wife to assume any caregiver duties as needed. Pt/family expectations/goals: Pt hopes "... to be out of here in less than a week..."  [employer]: None Premorbid Home Care/DME Agencies: Other (Comment) [facility]) Transportation available at discharge: yes  Discharge Planning Living Arrangements: Spouse/significant other Support Systems: Spouse/significant other Type of Residence: Private residence Insurance Resources:  Administrator (specify) Building services engineer) Financial Resources: Restaurant manager, fast food Screen Referred: No Living Expenses: Own Money Management: Patient Does the patient have any problems obtaining your medications?: No Home Management: pt and wife Patient/Family Preliminary Plans: Pt plans to d/c home with wife who will provide 24/7 assistance Social Work Anticipated Follow Up Needs: HH/OP Expected length of stay: 7 days  Clinical Impression Pleasant gentleman here after FPBG and familiar to CIR (here Nov 2013).  Wife very supportive and able to provide 24/7 assist, however, likely to meet mod i goals.  No emotional distress noted.  Will follow for d/c planning referrals.  Navi Erber 01/15/2013, 3:55 PM

## 2013-01-16 ENCOUNTER — Inpatient Hospital Stay (HOSPITAL_COMMUNITY): Payer: Medicare Other | Admitting: Physical Therapy

## 2013-01-16 ENCOUNTER — Encounter (HOSPITAL_COMMUNITY): Payer: Medicare Other

## 2013-01-16 ENCOUNTER — Inpatient Hospital Stay (HOSPITAL_COMMUNITY): Payer: Medicare Other

## 2013-01-16 DIAGNOSIS — R5381 Other malaise: Secondary | ICD-10-CM

## 2013-01-16 DIAGNOSIS — I739 Peripheral vascular disease, unspecified: Secondary | ICD-10-CM

## 2013-01-16 DIAGNOSIS — G8110 Spastic hemiplegia affecting unspecified side: Secondary | ICD-10-CM

## 2013-01-16 LAB — GLUCOSE, CAPILLARY
Glucose-Capillary: 151 mg/dL — ABNORMAL HIGH (ref 70–99)
Glucose-Capillary: 106 mg/dL — ABNORMAL HIGH (ref 70–99)

## 2013-01-16 NOTE — Progress Notes (Signed)
Occupational Therapy Session Note  Patient Details  Name: Donald [name] MRN: [id number] Date of Birth: 1945/10/24  Today's Date: 01/16/2013  Session 1 Time: 0700-0755 Time Calculation (min): 55 min  Short Term Goals: Week 1:  OT Short Term Goal 1 (Week 1): STG=LTGs  Skilled Therapeutic Interventions/Progress Updates:    Pt resting in bed upon arrival and agreeable to bathing and dressing w/c level at sink.  Pt stated he preferred to take shower but unable to at this time secondary to staples in LLE.  Pt amb with RW to w/c at sink with supervision.  Pt required assistance with LB dressing secondary to increased pain in LLE this morning.  Pt stood at sink to bathe front perineal area and buttocks.  Focus on activity tolerance, dynamic standing balance, functional amb with RW, and safety awareness.  Therapy Documentation Precautions:  Precautions Precautions: Fall Precaution Comments: residual weakness L side-old CVA Restrictions Weight Bearing Restrictions: No Pain: Pain Assessment Pain Assessment: 0-10 Pain Score: 5  Pain Type: Acute pain Pain Location: Leg Pain Orientation: Left Pain Descriptors / Indicators: Aching Pain Onset: Gradual Pain Intervention(s): RN made aware  See FIM for current functional status  Therapy/Group: Individual Therapy  Session 2 Time: 1300-1345 Pt c/o 6/10 pain in LLE; RN notified Individual Therapy  Pt resting in w/c upon arrival.  Pt amb with SPC for 20' before resting in w/c.  Pt propelled w/c towards therapy gym and requested to amb with SPC again for approx 30'.  Pt initially engaged in dynamic standing activities playing Wii bowling.  Pt transitioned to functional amb with RW to gather items from various surfaces and from floor using reacher.  Pt required multiple rest breaks throughout session.  Pt stated that his LLE started aching with continued activity.  Focus on activity tolerance, dynamic standing balance, functional amb with RW,  and safety awareness.  Lavone Neri [facility] 01/16/2013, 7:56 AM

## 2013-01-16 NOTE — Progress Notes (Signed)
Physical Therapy Session Note  Patient Details  Name: CAILEB RHUE MRN: [id number] Date of Birth: 1946-03-25  Today's Date: 01/16/2013 Time: 1400-1445 Time Calculation (min): 45 min  Short Term Goals: Week 1:  PT Short Term Goal 1 (Week 1): STGs=LTGs  Skilled Therapeutic Interventions/Progress Updates:    Ambulation to the gym with RW and min-guard/min assist, 2 seated rest breaks due to fatigue. Supine slow and easy hip/knee ROM to Lt. LE, pt having difficulty with knee and hip extension (worse than prior to admission), pt encouraged to allow leg to fully stretch in bed. Bed mobiltiy on mat and bed at supervision level. Standing balance single limb stance kicking soccer ball with one hand hold assist, pt nervous without walker for support. Ambulation with cane 60', 50' with min assist, pt feels unsteady with cane and feels he will be most comfortable starting out with the RW once home. Good safety awareness.   Therapy Documentation Precautions:  Precautions Precautions: Fall Precaution Comments: residual weakness L side-old CVA Restrictions Weight Bearing Restrictions: No Pain: Pain Assessment Faces Pain Scale: Hurts little more Pain Type: Surgical pain Pain Location: Leg Pain Orientation: Left Pain Descriptors / Indicators: Aching (pulling) Pain Onset: On-going Pain Intervention(s): Repositioned;Rest  See FIM for current functional status  Therapy/Group: Individual Therapy  Wilhemina Bonito 01/16/2013, 3:44 PM

## 2013-01-16 NOTE — Progress Notes (Signed)
Subjective/Complaints: Stools a little frequent yesterday. Have since slowed down. Had a reasonable night.  A 12 point review of systems has been performed and if not noted above is otherwise negative.   Objective: Vital Signs: Blood pressure 117/66, pulse 76, temperature 97.7 F (36.5 C), temperature source Oral, resp. rate 18, height 5' 8.9" (1.75 m), weight 82.9 kg (182 lb 12.2 oz), SpO2 95.00%. No results found.  Recent Labs  01/13/13 1835 01/14/13 0530  WBC 7.4 7.2  HGB 8.4* 8.4*  HCT 25.6* 25.9*  PLT 264 321    Recent Labs  01/13/13 1835 01/14/13 0530  NA  --  137  K  --  3.8  CL  --  102  GLUCOSE  --  150*  BUN  --  10  CREATININE 1.14 1.02  CALCIUM  --  8.7   CBG (last 3)   Recent Labs  01/15/13 1640 01/15/13 2049 01/16/13 0738  GLUCAP 146* 100* 151*    Wt Readings from Last 3 Encounters:  01/14/13 82.9 kg (182 lb 12.2 oz)  01/06/13 90.3 kg (199 lb 1.2 oz)  01/06/13 90.3 kg (199 lb 1.2 oz)    Physical Exam:  Constitutional: He is oriented to person, place, and time. No distress  HENT:  Head: Normocephalic. Tongue pink,moist, dentition good  Eyes: EOM are normal.  Neck: Normal range of motion. Neck supple. No thyromegaly present.  Cardiovascular: Normal rate and irregular rhythm. No murmur  Pulmonary/Chest: Effort normal and breath sounds normal. No respiratory distress. No wheezes  Abdominal: Soft. Bowel sounds are normal. He exhibits no distension. There is no tenderness.  Neurological: He is alert and oriented to person, place, and time.  Musc: left leg with 1++ edema, tender along incision. Scrotal edema is decreased  Left hemiparesis, sensory deficits. LUE is 1-2/5 bicep, tricep, deltoid, HI and wrist are 1 to 1+. LUE with apraxia, inhibited by tone which was variable, 2-3/4. LLE is grossly 1-2/5 inhibited by pain/swelling in left leg. Tone is 1+. Left central 7 with good tongue control. Speech clear. Cognitively with good insight and awareness.   Skin: Bypass graft site clean and dry with some surrounding erythema. Drain site still with serous drainage-- Dry dressing to left first toe   patient with chronic vascular changes in either leg   Assessment/Plan: 1. Functional deficits secondary to deconditioning, PVD requiring LLE BPG  which require 3+ hours per day of interdisciplinary therapy in a comprehensive inpatient rehab setting. Physiatrist is providing close team supervision and 24 hour management of active medical problems listed below. Physiatrist and rehab team continue to assess barriers to discharge/monitor patient progress toward functional and medical goals. FIM: FIM - Bathing Bathing Steps Patient Completed: Chest;Left Arm;Abdomen;Front perineal area;Buttocks;Right upper leg;Left upper leg Bathing: 3: Mod-Patient completes 5-7 2f 10 parts or 50-74%  FIM - Upper Body Dressing/Undressing Upper body dressing/undressing steps patient completed: Thread/unthread right sleeve of pullover shirt/dresss;Thread/unthread left sleeve of pullover shirt/dress;Put head through opening of pull over shirt/dress;Pull shirt over trunk Upper body dressing/undressing: 5: Set-up assist to: Obtain clothing/put away FIM - Lower Body Dressing/Undressing Lower body dressing/undressing steps patient completed: Pull pants up/down;Thread/unthread right pants leg;Don/Doff right sock;Don/Doff right shoe;Fasten/unfasten right shoe Lower body dressing/undressing: 3: Mod-Patient completed 50-74% of tasks        FIM - Bed/Chair Transfer Bed/Chair Transfer: 5: Supine > Sit: Supervision (verbal cues/safety issues);5: Bed > Chair or W/C: Supervision (verbal cues/safety issues)  FIM - Locomotion: Wheelchair Distance: 150 Locomotion: Wheelchair: 5: Travels 150 ft  or more: maneuvers on rugs and over door sills with supervision, cueing or coaxing FIM - Locomotion: Ambulation Locomotion: Ambulation Assistive Devices: Walker -  Rolling;Orthosis Ambulation/Gait Assistance: 4: Min assist Locomotion: Ambulation: 4: Travels 150 ft or more with minimal assistance (Pt.>75%)  Comprehension Comprehension Mode: Auditory Comprehension: 5-Understands complex 90% of the time/Cues < 10% of the time  Expression Expression Mode: Verbal Expression: 5-Expresses basic needs/ideas: With extra time/assistive device  Social Interaction Social Interaction: 5-Interacts appropriately 90% of the time - Needs monitoring or encouragement for participation or interaction.  Problem Solving Problem Solving: 5-Solves basic problems: With no assist  Memory Memory: 6-More than reasonable amt of time  Medical Problem List and Plan:  1. Severe peripheral artery disease status post left F. 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 DVT Prophylaxis/Anticoagulation: Subcutaneous Lovenox. Monitor platelet counts and any signs of bleeding  3. Pain Management: Neurontin 300 mg 3 times a day, Percocet as needed. Monitor with increased mobility  4. Neuropsych: This patient is capable of making decisions on his own behalf.  5. Acute blood loss anemia. Followup hgb 8.4, no signs of further blood loss at present 6. Hypertension. Lisinopril 2.5 mg daily. Normotensive at present 7. Diabetes mellitus with peripheral neuropathy. Hemoglobin A1c 7.4. Check blood sugars a.c. and at bedtime  8. Tobacco abuse. Continue Zyban and provide counseling  9. Hyperlipidemia. Lovaza  10. GERD. Protonix  11. Wounds-continue dry dressing to drain site. Remaining areas dry/staples  LOS (Days) 3 A FACE TO FACE EVALUATION WAS PERFORMED  SWARTZ,ZACHARY T 01/16/2013 8:24 AM

## 2013-01-16 NOTE — Progress Notes (Signed)
Physical Therapy Session Note  Patient Details  Name: Donald [name] MRN: [id number] Date of Birth: 03-07-1946  Today's Date: 01/16/2013 Time: 4098-1191 Time Calculation (min): 45 min  Short Term Goals: Week 1:  PT Short Term Goal 1 (Week 1): STGs=LTGs  Skilled Therapeutic Interventions/Progress Updates:    pt sitting in W/C.  Sit to stand with S and demos good use of R UE and placement of L UE.  Amb with RW and L hand orthosis MinA- MinG 75', 60', and 60'.  When pt has external distractions or attempts tight turns requires MinA to maintain balance, otherwise MinG with ambulation.  Pt amb through obstacles, around cones, up/down 3 stairs with one rail, stepping over poles with fluctuating between MinG and MinA to maintain balance.  Cues throughout session to slow down and attend to balance, though pt states he gets anxious with balance challenges and tries to rush.    Therapy Documentation Precautions:  Precautions Precautions: Fall Precaution Comments: residual weakness L side-old CVA Restrictions Weight Bearing Restrictions: No  See FIM for current functional status  Therapy/Group: Individual Therapy  Alizah Sills, Alison Murray 01/16/2013, 10:30 AM

## 2013-01-17 ENCOUNTER — Inpatient Hospital Stay (HOSPITAL_COMMUNITY): Payer: Medicare Other | Admitting: Occupational Therapy

## 2013-01-17 ENCOUNTER — Inpatient Hospital Stay (HOSPITAL_COMMUNITY): Payer: Medicare Other | Admitting: Physical Therapy

## 2013-01-17 DIAGNOSIS — I739 Peripheral vascular disease, unspecified: Secondary | ICD-10-CM

## 2013-01-17 DIAGNOSIS — R5381 Other malaise: Secondary | ICD-10-CM

## 2013-01-17 DIAGNOSIS — G8110 Spastic hemiplegia affecting unspecified side: Secondary | ICD-10-CM

## 2013-01-17 LAB — GLUCOSE, CAPILLARY
Glucose-Capillary: 113 mg/dL — ABNORMAL HIGH (ref 70–99)
Glucose-Capillary: 127 mg/dL — ABNORMAL HIGH (ref 70–99)
Glucose-Capillary: 111 mg/dL — ABNORMAL HIGH (ref 70–99)

## 2013-01-17 NOTE — Progress Notes (Signed)
Occupational Therapy Session Note  Patient Details  Name: Donald [name] MRN: [id number] Date of Birth: 10-11-1945  Today's Date: 01/17/2013 Time: 0865-7846 Time Calculation (min): 60 min  Short Term Goals: Week 1:  OT Short Term Goal 1 (Week 1): STG=LTGs  Skilled Therapeutic Interventions/Progress Updates:  Patient found seated in w/c upon entering room with no complaints of pain. Therapist propelled patient -> therapy gym for therapeutic exercise focusing on strengthening -> BUEs using 3lb weighted bar, dynamic standing balance/tolerance/endurance, w/c <> therapy mat transfers, and overall activity tolerance/endurance. Therapist then propelled patient -> ADL apartment for focus on functional ambulation/mobility using cane, bed transfer/mobility, toilet transfer, simulated walk-in shower transfer using blue block, and overall activity tolerance/endurance. Patient propelled self half-way back to room using BLEs and ambulated the rest of the way using his cane. Left patient seated in w/c at end of session with call bell & phone within reach; wife present in room as well.   Precautions:  Precautions Precautions: Fall Precaution Comments: residual weakness L side-old CVA Restrictions Weight Bearing Restrictions: No  See FIM for current functional status  Therapy/Group: Individual Therapy  Taya Ashbaugh 01/17/2013, 1:47 PM

## 2013-01-17 NOTE — Progress Notes (Signed)
No complaint Small amount of drainage from below knee incision, left foot pink warm some edema Left groin continues to heal Hopefully finished with rehab next week Has follow up in our office in a few weeks  Fabienne Bruns, MD [facility] Office: [phone] Pager: [phone]

## 2013-01-17 NOTE — Progress Notes (Signed)
Occupational Therapy Session Note  Patient Details  Name: Donald [name] MRN: [id number] Date of Birth: 1946-02-05  Today's Date: 01/17/2013 Time: 1400-1500 Time Calculation (min): 60 min  Skilled Therapeutic Interventions/Progress Updates: PM session:  Patient participated in group activity playing dominoes with focus on standing balance.  AFter standing x4 for about 30 seconds each try, patient stated that his left knee was painful 8/10 but that RN had given him pain meds right before therapy session.     Therapy Documentation Precautions:  Precautions Precautions: Fall Precaution Comments: residual weakness L side-old CVA Restrictions Weight Bearing Restrictions: No  Pain:8/10 in left surgical site constant aching-worse in standing position  See FIM for current functional status  Therapy/Group: Group Therapy  Rozelle Logan 01/17/2013, 5:36 PM

## 2013-01-17 NOTE — Progress Notes (Signed)
Occupational Therapy Session Note  Patient Details  Name: Donald [name] MRN: [id number] Date of Birth: 1946/03/08  Today's Date: 01/17/2013 Time: 02-1144 Time Calculation (min):45  Skilled Therapeutic Interventions/Progress Updates: Patient scheduled for ADL and informed nursing that he is only comfortable undressing private area in front of a male; so, the male nursing tech saw patient for periarea/buttock cleansing.   This clinician informed male tech of safest way to support patient standing at sink to wash periarea and pull up pants.   The rest of the bathing and dressing was completed with this OT clinician and focus was on sit to standing, standing and donning socks and shoes.  Patient required assist to don left sock and shoe due to old CVA in L LE and decreased hip flexion and decreased left lower extremity strength and ROM.  Wife arrived when ADL was completed    Therapy Documentation Precautions:  Precautions Precautions: Fall Precaution Comments: residual weakness L side-old CVA Restrictions Weight Bearing Restrictions: No Pain:denied  See FIM for current functional status  Therapy/Group: Individual Therapy  [facility] 01/17/2013, 4:12 PM

## 2013-01-17 NOTE — Progress Notes (Signed)
Patient ID: Donald [name], male   DOB: 13-Jul-1945, 67 y.o.   MRN: [id number] Subjective/Complaints: Stools slowing down, pain at surgical sites A 12 point review of systems has been performed and if not noted above is otherwise negative.   Objective: Vital Signs: Blood pressure 98/64, pulse 75, temperature 98.4 F (36.9 C), temperature source Oral, resp. rate 17, height 5' 8.9" (1.75 m), weight 82.9 kg (182 lb 12.2 oz), SpO2 96.00%. No results found. No results found for this basename: WBC, HGB, HCT, PLT,  in the last 72 hours No results found for this basename: NA, K, CL, CO, GLUCOSE, BUN, CREATININE, CALCIUM,  in the last 72 hours CBG (last 3)   Recent Labs  01/16/13 1114 01/16/13 1644 01/16/13 2129  GLUCAP 106* 129* 113*    Wt Readings from Last 3 Encounters:  01/14/13 82.9 kg (182 lb 12.2 oz)  01/06/13 90.3 kg (199 lb 1.2 oz)  01/06/13 90.3 kg (199 lb 1.2 oz)    Physical Exam:  Constitutional: He is oriented to person, place, and time. No distress  HENT:  Head: Normocephalic. Tongue pink,moist, dentition good  Eyes: EOM are normal.  Neck: Normal range of motion. Neck supple. No thyromegaly present.  Cardiovascular: Normal rate and irregular rhythm. No murmur  Pulmonary/Chest: Effort normal and breath sounds normal. No respiratory distress. No wheezes  Abdominal: Soft. Bowel sounds are normal. He exhibits no distension. There is no tenderness.  Neurological: He is alert and oriented to person, place, and time.  Musc: left leg with 1++ edema, tender along incision. Scrotal edema is mild/mod Left hemiparesis, sensory deficits. Tone is 1+. Left central 7 with good tongue control. Speech clear. Cognitively with good insight and awareness.  Skin: Bypass graft site clean and dry with some surrounding erythema. Drain site still with serous drainage-- Dry dressing to left first toe   patient with chronic vascular changes in either leg   Assessment/Plan: 1. Functional  deficits secondary to deconditioning, PVD requiring LLE BPG  which require 3+ hours per day of interdisciplinary therapy in a comprehensive inpatient rehab setting. Physiatrist is providing close team supervision and 24 hour management of active medical problems listed below. Physiatrist and rehab team continue to assess barriers to discharge/monitor patient progress toward functional and medical goals. FIM: FIM - Bathing Bathing Steps Patient Completed: Chest;Left Arm;Abdomen;Front perineal area;Buttocks;Right upper leg;Left upper leg Bathing: 3: Mod-Patient completes 5-7 13f 10 parts or 50-74%  FIM - Upper Body Dressing/Undressing Upper body dressing/undressing steps patient completed: Thread/unthread right sleeve of pullover shirt/dresss;Thread/unthread left sleeve of pullover shirt/dress;Put head through opening of pull over shirt/dress;Pull shirt over trunk Upper body dressing/undressing: 5: Set-up assist to: Obtain clothing/put away FIM - Lower Body Dressing/Undressing Lower body dressing/undressing steps patient completed: Pull pants up/down;Thread/unthread right pants leg;Don/Doff right sock;Don/Doff right shoe;Fasten/unfasten right shoe Lower body dressing/undressing: 3: Mod-Patient completed 50-74% of tasks  FIM - Toileting Toileting steps completed by patient: Adjust clothing prior to toileting;Performs perineal hygiene;Adjust clothing after toileting (per Baird Cancer, NT) Toileting: 4: Steadying assist (per Baird Cancer, NT)  FIM - Toilet Transfers Toilet Transfers: 4-To toilet/BSC: Min A (steadying Pt. > 75%) (per Baird Cancer, NT)  FIM - Bed/Chair Transfer Bed/Chair Transfer Assistive Devices: Manufacturing systems engineer Transfer: 5: Bed > Chair or W/C: Supervision (verbal cues/safety issues);4: Chair or W/C > Bed: Min A (steadying Pt. > 75%);5: Sit > Supine: Supervision (verbal cues/safety issues)  FIM - Locomotion: Wheelchair Distance: 150 Locomotion: Wheelchair: 5: Travels 150 ft  or more: maneuvers on  rugs and over door sills with supervision, cueing or coaxing FIM - Locomotion: Ambulation Locomotion: Ambulation Assistive Devices: Walker - Rolling Ambulation/Gait Assistance: 4: Min assist Locomotion: Ambulation: 2: Travels 50 - 149 ft with minimal assistance (Pt.>75%)  Comprehension Comprehension Mode: Auditory Comprehension: 5-Understands basic 90% of the time/requires cueing < 10% of the time  Expression Expression Mode: Verbal Expression: 5-Expresses basic needs/ideas: With extra time/assistive device  Social Interaction Social Interaction: 5-Interacts appropriately 90% of the time - Needs monitoring or encouragement for participation or interaction.  Problem Solving Problem Solving: 5-Solves basic problems: With no assist  Memory Memory: 6-More than reasonable amt of time  Medical Problem List and Plan:  1. Severe peripheral artery disease status post left F. 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 DVT Prophylaxis/Anticoagulation: Subcutaneous Lovenox. Monitor platelet counts and any signs of bleeding  3. Pain Management: Neurontin 300 mg 3 times a day, Percocet as needed. Monitor with increased mobility  4. Neuropsych: This patient is capable of making decisions on his own behalf.  5. Acute blood loss anemia. Followup hgb 8.4, no signs of further blood loss at present 6. Hypertension. Lisinopril 2.5 mg daily. Normotensive at present 7. Diabetes mellitus with peripheral neuropathy. Hemoglobin A1c 7.4. Check blood sugars a.c. and at bedtime  8. Tobacco abuse. Continue Zyban and provide counseling  9. Hyperlipidemia. Lovaza  10. GERD. Protonix  11. Wounds-continue dry dressing to drain site. Remaining areas dry/staples  LOS (Days) 4 A FACE TO FACE EVALUATION WAS PERFORMED  Erick Colace 01/17/2013 6:46 AM

## 2013-01-17 NOTE — Progress Notes (Signed)
Physical Therapy Note  Patient Details  Name: Donald [name] MRN: [id number] Date of Birth: [birth year] Today's Date: 01/17/2013  1000-1030 (30 minutes) individual  Pain: 4/10 left groin (burning type pain) ; premedicated Focus of treatment: therapeutic exercise focused on standing weight shifts/ balance; gait training Treatment: Kinetron in standing (increased left groin pain in sitting) with bilateral UE support 4 X 10 to facilitate erect standing / increased weight shift to left ; gait - 80 feet X 2 RW + hand splint left SBA.    Beatryce Colombo,JIM 01/17/2013, 10:14 AM

## 2013-01-18 ENCOUNTER — Inpatient Hospital Stay (HOSPITAL_COMMUNITY): Payer: Medicare Other | Admitting: *Deleted

## 2013-01-18 ENCOUNTER — Inpatient Hospital Stay (HOSPITAL_COMMUNITY): Payer: Medicare Other | Admitting: Physical Therapy

## 2013-01-18 LAB — GLUCOSE, CAPILLARY
Glucose-Capillary: 128 mg/dL — ABNORMAL HIGH (ref 70–99)
Glucose-Capillary: 86 mg/dL (ref 70–99)
Glucose-Capillary: 149 mg/dL — ABNORMAL HIGH (ref 70–99)
Glucose-Capillary: 123 mg/dL — ABNORMAL HIGH (ref 70–99)

## 2013-01-18 NOTE — Progress Notes (Signed)
Patient ID: Donald [name], male   DOB: 10/05/1945, 67 y.o.   MRN: [id number] Subjective/Complaints: Patient was pleased to see his surgeon yesterday Patient states that constipation has been chronic in the hospital but seems to do better at home A 12 point review of systems has been performed and if not noted above is otherwise negative.   Objective: Vital Signs: Blood pressure 137/74, pulse 75, temperature 98.7 F (37.1 C), temperature source Oral, resp. rate 18, height 5' 8.9" (1.75 m), weight 82.9 kg (182 lb 12.2 oz), SpO2 93.00%. No results found. No results found for this basename: WBC, HGB, HCT, PLT,  in the last 72 hours No results found for this basename: NA, K, CL, CO, GLUCOSE, BUN, CREATININE, CALCIUM,  in the last 72 hours CBG (last 3)   Recent Labs  01/17/13 1619 01/17/13 2058 01/18/13 0731  GLUCAP 127* 111* 128*    Wt Readings from Last 3 Encounters:  01/14/13 82.9 kg (182 lb 12.2 oz)  01/06/13 90.3 kg (199 lb 1.2 oz)  01/06/13 90.3 kg (199 lb 1.2 oz)    Physical Exam:  Constitutional: He is oriented to person, place, and time. No distress  HENT:  Head: Normocephalic. Tongue pink,moist, dentition good  Eyes: EOM are normal.  Neck: Normal range of motion. Neck supple. No thyromegaly present.  Cardiovascular: Normal rate and irregular rhythm. No murmur  Pulmonary/Chest: Effort normal and breath sounds normal. No respiratory distress. No wheezes  Abdominal: Soft. Bowel sounds are normal. He exhibits no distension. There is no tenderness.  Neurological: He is alert and oriented to person, place, and time.  Musc: left leg with 1++ edema, tender along incision. Scrotal edema is mild/mod Left hemiparesis, sensory deficits. Tone is 1+. Left central 7 with good tongue control. Speech clear. Cognitively with good insight and awareness.  Skin: Bypass graft site clean and dry with some surrounding erythema. Drain site still with serous drainage-- Dry dressing to left  first toe   patient with chronic vascular changes in either leg   Assessment/Plan: 1. Functional deficits secondary to deconditioning, PVD requiring LLE BPG  which require 3+ hours per day of interdisciplinary therapy in a comprehensive inpatient rehab setting. Physiatrist is providing close team supervision and 24 hour management of active medical problems listed below. Physiatrist and rehab team continue to assess barriers to discharge/monitor patient progress toward functional and medical goals. FIM: FIM - Bathing Bathing Steps Patient Completed: Chest;Left Arm;Abdomen;Front perineal area;Buttocks;Right upper leg;Left upper leg Bathing: 3: Mod-Patient completes 5-7 74f 10 parts or 50-74%  FIM - Upper Body Dressing/Undressing Upper body dressing/undressing steps patient completed: Thread/unthread right sleeve of pullover shirt/dresss;Thread/unthread left sleeve of pullover shirt/dress;Put head through opening of pull over shirt/dress;Pull shirt over trunk Upper body dressing/undressing: 5: Set-up assist to: Obtain clothing/put away FIM - Lower Body Dressing/Undressing Lower body dressing/undressing steps patient completed: Pull pants up/down;Thread/unthread right pants leg;Don/Doff right sock;Don/Doff right shoe;Fasten/unfasten right shoe Lower body dressing/undressing: 3: Mod-Patient completed 50-74% of tasks  FIM - Toileting Toileting steps completed by patient: Adjust clothing prior to toileting;Performs perineal hygiene;Adjust clothing after toileting (per Baird Cancer, NT) Toileting: 4: Steadying assist (per Baird Cancer, NT)  FIM - Toilet Transfers Toilet Transfers: 4-To toilet/BSC: Min A (steadying Pt. > 75%) (per Baird Cancer, NT)  FIM - Bed/Chair Transfer Bed/Chair Transfer Assistive Devices: Manufacturing systems engineer Transfer: 5: Bed > Chair or W/C: Supervision (verbal cues/safety issues);4: Chair or W/C > Bed: Min A (steadying Pt. > 75%);5: Sit > Supine: Supervision (verbal  cues/safety  issues)  FIM - Locomotion: Wheelchair Distance: 150 Locomotion: Wheelchair: 5: Travels 150 ft or more: maneuvers on rugs and over door sills with supervision, cueing or coaxing FIM - Locomotion: Ambulation Locomotion: Ambulation Assistive Devices: Designer, industrial/product Ambulation/Gait Assistance: 4: Min assist Locomotion: Ambulation: 2: Travels 50 - 149 ft with minimal assistance (Pt.>75%)  Comprehension Comprehension Mode: Auditory Comprehension: 5-Understands basic 90% of the time/requires cueing < 10% of the time  Expression Expression Mode: Verbal Expression: 5-Expresses basic needs/ideas: With extra time/assistive device  Social Interaction Social Interaction: 5-Interacts appropriately 90% of the time - Needs monitoring or encouragement for participation or interaction.  Problem Solving Problem Solving: 5-Solves basic problems: With no assist  Memory Memory: 6-More than reasonable amt of time  Medical Problem List and Plan:  1. Severe peripheral artery disease status post left F. 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 DVT Prophylaxis/Anticoagulation: Subcutaneous Lovenox. Monitor platelet counts and any signs of bleeding  3. Pain Management: Neurontin 300 mg 3 times a day, Percocet as needed. Monitor with increased mobility  4. Neuropsych: This patient is capable of making decisions on his own behalf.  5. Acute blood loss anemia. Followup hgb 8.4, no signs of further blood loss at present 6. Hypertension. Lisinopril 2.5 mg daily. Normotensive at present 7. Diabetes mellitus with peripheral neuropathy. Hemoglobin A1c 7.4. Check blood sugars a.c. and at bedtime  8. Tobacco abuse. Continue Zyban and provide counseling  9. Hyperlipidemia. Lovaza  10. GERD. Protonix  11. Wounds-continue dry dressing to drain site. Remaining areas dry/staples  LOS (Days) 5 A FACE TO FACE EVALUATION WAS  PERFORMED  Erick Colace 01/18/2013 9:31 AM

## 2013-01-18 NOTE — Progress Notes (Signed)
Occupational Therapy Note  Patient Details  Name: ABRIAN HANOVER MRN: [id number] Date of Birth: [birth year] Today's Date: 01/18/2013  Time:1030-1115  (45 min) Pain:  none Individual session  1st session:  Pt. Had nursing do anterior, posterior per iarea before OT arrival.  Pt. Already had on shorts.  Needed asssitance with shoes but unable to do socks.  Pt. Did grooming with no assistance.  Pt. Ambulated with RW about 100 feet and then returned to room and left in wc with call bell and phone in place.    Humberto Seals 01/18/2013, 11:08 AM

## 2013-01-19 ENCOUNTER — Inpatient Hospital Stay (HOSPITAL_COMMUNITY): Payer: Medicare Other | Admitting: Physical Therapy

## 2013-01-19 ENCOUNTER — Other Ambulatory Visit: Payer: Self-pay | Admitting: Neurology

## 2013-01-19 ENCOUNTER — Inpatient Hospital Stay (HOSPITAL_COMMUNITY): Payer: Medicare Other

## 2013-01-19 ENCOUNTER — Inpatient Hospital Stay (HOSPITAL_COMMUNITY): Payer: Medicare Other | Admitting: Occupational Therapy

## 2013-01-19 DIAGNOSIS — L0390 Cellulitis, unspecified: Secondary | ICD-10-CM

## 2013-01-19 DIAGNOSIS — L0291 Cutaneous abscess, unspecified: Secondary | ICD-10-CM

## 2013-01-19 LAB — GLUCOSE, CAPILLARY
Glucose-Capillary: 105 mg/dL — ABNORMAL HIGH (ref 70–99)
Glucose-Capillary: 161 mg/dL — ABNORMAL HIGH (ref 70–99)

## 2013-01-19 MED ORDER — ALTEPLASE 2 MG IJ SOLR
2.0000 mg | Freq: Once | INTRAMUSCULAR | Status: AC
  Administered 2013-01-19: 2 mg
  Filled 2013-01-19: qty 2

## 2013-01-19 MED ORDER — CEPHALEXIN 500 MG PO CAPS
500.0000 mg | ORAL_CAPSULE | Freq: Three times a day (TID) | ORAL | Status: DC
  Administered 2013-01-19 – 2013-01-20 (×3): 500 mg via ORAL
  Filled 2013-01-19 (×5): qty 1

## 2013-01-19 MED ORDER — ENOXAPARIN SODIUM 40 MG/0.4ML ~~LOC~~ SOLN
40.0000 mg | SUBCUTANEOUS | Status: DC
  Administered 2013-01-20 – 2013-01-21 (×2): 40 mg via SUBCUTANEOUS
  Filled 2013-01-19 (×3): qty 0.4

## 2013-01-19 NOTE — Progress Notes (Signed)
Occupational Therapy Session Note  Patient Details  Name: Donald [name] MRN: [id number] Date of Birth: Jan 31, 1946  Today's Date: 01/19/2013 Time: 1400-1430 Time Calculation (min): 30 min  Short Term Goals: Week 1:  OT Short Term Goal 1 (Week 1): STG=LTGs  Skilled Therapeutic Interventions/Progress Updates:  Patient sleeping in recliner upon arrival with wife in room.  Engaged family/caregiver education regarding toilet transfer and toileting with RW.  Patient's wife demonstrated independence providing assistance to and from the bathroom and during toileting.  Patient encouraged to slow down as well as allow room for wife to position herself during all times especially backing up and turning.  Patient reports that he will be using the RW at home them park RW outside the bathroom door since it does not fit then use the countertop then grab bar to assist him in the bathroom.  Patient also ambulated with [facility] with therapist into and out of the bathroom in narrow opening and simulate use of cabinet for sit><stand from toilet then ambulated into the hallway short distance then back to the recliner with 2 LOB yet patient able to self correct and did not need assist to recover.    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

## 2013-01-19 NOTE — Progress Notes (Signed)
Physical Therapy Session Note  Patient Details  Name: Donald [name] MRN: [id number] Date of Birth: 12/06/1945  Today's Date: 01/19/2013 Time: 0830-0930 Time Calculation (min): 60 min  Short Term Goals: Week 1:  PT Short Term Goal 1 (Week 1): STGs=LTGs  Skilled Therapeutic Interventions/Progress Updates:    Still limited some by pain in Lt./Rt. LEs. Ambulation x 120' with RW and supervision, distance limited by pain. Curb Step and ramp demo by PT then pt performed x 2 reps with cues for sequencing and safety. Car transfer x 2, one with pt stepping in (demostrating how he normally performs), educated on safety and recommended sit then swing LEs in method. Pt verbalized understanding. Ambulation forwards/backwards/side stepping over carpeted surface with RW and supervision, cues needed for safety with RW to avoid lifting off ground while stepping. Pt feels he is nearing preparedness to go home.    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

## 2013-01-19 NOTE — Progress Notes (Signed)
Occupational Therapy Note  Patient Details  Name: ERRIN CHEWNING MRN: [id number] Date of Birth: 02/12/1946 Today's Date: 01/19/2013  Time: 10-11am ( .) Pt seen for 1:1 OT session focusing on transfers, functional mobility, activity tolerance and dynamic balance. Pt in wheelchair upon arrival, c/o 4/10 pain in LE's which he said had decreased in last . Pt propelled wheelchair about 90ft before c/o burning in LLE, therapist propelled rest of way. In ADL apartment, pt demonstrated transfers to multiple ADL surfaces including dining chair, loveseat and bed with verbal cues to get his footing and balance before transferring. Dynamic standing balance with pt in kitchen with intermittent verbal cues to follow through with task and for pacing. Pt tends to move quickly through an activity. Ended session with pt in wheelchair in room with wife present.   Tej Murdaugh M Meline Russaw 01/19/2013, 11:18 AM

## 2013-01-19 NOTE — Progress Notes (Signed)
Patient ID: Donald [name], male   DOB: 09-Dec-1945, 67 y.o.   MRN: [id number] Subjective/Complaints: Some constipation over the weekend A 12 point review of systems has been performed and if not noted above is otherwise negative.   Objective: Vital Signs: Blood pressure 135/71, pulse 76, temperature 97.6 F (36.4 C), temperature source Oral, resp. rate 18, height 5' 8.9" (1.75 m), weight 82.9 kg (182 lb 12.2 oz), SpO2 98.00%. No results found. No results found for this basename: WBC, HGB, HCT, PLT,  in the last 72 hours No results found for this basename: NA, K, CL, CO, GLUCOSE, BUN, CREATININE, CALCIUM,  in the last 72 hours CBG (last 3)   Recent Labs  01/18/13 1150 01/18/13 1611 01/18/13 2133  GLUCAP 86 149* 123*    Wt Readings from Last 3 Encounters:  01/14/13 82.9 kg (182 lb 12.2 oz)  01/06/13 90.3 kg (199 lb 1.2 oz)  01/06/13 90.3 kg (199 lb 1.2 oz)    Physical Exam:  Constitutional: He is oriented to person, place, and time. No distress  HENT:  Head: Normocephalic. Tongue pink,moist, dentition good  Eyes: EOM are normal.  Neck: Normal range of motion. Neck supple. No thyromegaly present.  Cardiovascular: Normal rate and irregular rhythm. No murmur  Pulmonary/Chest: Effort normal and breath sounds normal. No respiratory distress. No wheezes  Abdominal: Soft. Bowel sounds are normal. He exhibits no distension. There is no tenderness.  Neurological: He is alert and oriented to person, place, and time.  Musc: left leg with 1++ edema, tender along incision. Scrotal edema is mild/mod Left hemiparesis, sensory deficits. Tone is 1+. Left central 7 with good tongue control. Speech clear. Cognitively with good insight and awareness.  Skin: Bypass graft site clean and dry with some surrounding erythema. Drain site still with serous drainage-- Dry dressing to left first toe   patient with chronic vascular changes in either leg   Assessment/Plan: 1. Functional deficits  secondary to deconditioning, PVD requiring LLE BPG  which require 3+ hours per day of interdisciplinary therapy in a comprehensive inpatient rehab setting. Physiatrist is providing close team supervision and 24 hour management of active medical problems listed below. Physiatrist and rehab team continue to assess barriers to discharge/monitor patient progress toward functional and medical goals. FIM: FIM - Bathing Bathing Steps Patient Completed: Chest;Left Arm;Abdomen;Front perineal area;Buttocks;Right upper leg;Left upper leg Bathing: 3: Mod-Patient completes 5-7 66f 10 parts or 50-74%  FIM - Upper Body Dressing/Undressing Upper body dressing/undressing steps patient completed: Thread/unthread right sleeve of pullover shirt/dresss;Thread/unthread left sleeve of pullover shirt/dress;Put head through opening of pull over shirt/dress;Pull shirt over trunk Upper body dressing/undressing: 5: Set-up assist to: Obtain clothing/put away FIM - Lower Body Dressing/Undressing Lower body dressing/undressing steps patient completed: Pull pants up/down;Thread/unthread right pants leg;Don/Doff right sock;Don/Doff right shoe;Fasten/unfasten right shoe Lower body dressing/undressing: 3: Mod-Patient completed 50-74% of tasks  FIM - Toileting Toileting steps completed by patient: Adjust clothing prior to toileting;Performs perineal hygiene;Adjust clothing after toileting (per Baird Cancer, NT) Toileting: 4: Steadying assist (per Baird Cancer, NT)  FIM - Toilet Transfers Toilet Transfers: 4-To toilet/BSC: Min A (steadying Pt. > 75%) (per Baird Cancer, NT)  FIM - Bed/Chair Transfer Bed/Chair Transfer Assistive Devices: Manufacturing systems engineer Transfer: 5: Bed > Chair or W/C: Supervision (verbal cues/safety issues);4: Chair or W/C > Bed: Min A (steadying Pt. > 75%);5: Sit > Supine: Supervision (verbal cues/safety issues)  FIM - Locomotion: Wheelchair Distance: 150 Locomotion: Wheelchair: 5: Travels 150 ft or more:  maneuvers on rugs and  over door sills with supervision, cueing or coaxing FIM - Locomotion: Ambulation Locomotion: Ambulation Assistive Devices: Walker - Rolling Ambulation/Gait Assistance: 4: Min assist Locomotion: Ambulation: 2: Travels 50 - 149 ft with minimal assistance (Pt.>75%)  Comprehension Comprehension Mode: Auditory Comprehension: 5-Understands basic 90% of the time/requires cueing < 10% of the time  Expression Expression Mode: Verbal Expression: 5-Expresses basic needs/ideas: With extra time/assistive device  Social Interaction Social Interaction: 5-Interacts appropriately 90% of the time - Needs monitoring or encouragement for participation or interaction.  Problem Solving Problem Solving: 5-Solves basic problems: With no assist  Memory Memory: 6-More than reasonable amt of time  Medical Problem List and Plan:  1. Severe peripheral artery disease status post left F. 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 DVT Prophylaxis/Anticoagulation: Subcutaneous Lovenox. Monitor platelet counts and any signs of bleeding  3. Pain Management: Neurontin 300 mg 3 times a day, Percocet as needed. Monitor with increased mobility  4. Neuropsych: This patient is capable of making decisions on his own behalf.  5. Acute blood loss anemia. Followup hgb 8.4, no signs of further blood loss at present 6. Hypertension. Lisinopril 2.5 mg daily. Normotensive at present 7. Diabetes mellitus with peripheral neuropathy. Hemoglobin A1c 7.4. Check blood sugars a.c. and at bedtime  8. Tobacco abuse. Continue Zyban and provide counseling  9. Hyperlipidemia. Lovaza  10. GERD. Protonix  11. Wounds-continue current dressing to drain site. Staples in otherwise  LOS (Days) 6 A FACE TO FACE EVALUATION WAS PERFORMED  Tyquavious Gamel T 01/19/2013 7:45 AM

## 2013-01-19 NOTE — Progress Notes (Signed)
The area around the two upper incisions on the Pt's left inner thigh are red and warm to the touch. D Anguilli, PA notified.

## 2013-01-19 NOTE — Progress Notes (Signed)
Occupational Therapy Session Note  Patient Details  Name: Donald [name] MRN: [id number] Date of Birth: April 11, 1946  Today's Date: 01/19/2013 Time: 0700-0755 Time Calculation (min): 55 min  Short Term Goals: Week 1:  OT Short Term Goal 1 (Week 1): STG=LTGs  Skilled Therapeutic Interventions/Progress Updates:    Pt asleep in bed but easily aroused upon arrival.  Pt engaged in bathing and dressing w/c level at sink.  Pt used SPC for stand pivot transfer to w/c.  Pt required assistance with LB bathing and dressing.  Focus on activity tolerance, dynamic standing balance, functional amb with SPC for home mgmt tasks in room.  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

## 2013-01-20 ENCOUNTER — Inpatient Hospital Stay (HOSPITAL_COMMUNITY): Payer: Medicare Other | Admitting: Physical Therapy

## 2013-01-20 ENCOUNTER — Inpatient Hospital Stay (HOSPITAL_COMMUNITY): Payer: Medicare Other

## 2013-01-20 ENCOUNTER — Inpatient Hospital Stay (HOSPITAL_COMMUNITY): Payer: Medicare Other | Admitting: *Deleted

## 2013-01-20 LAB — CBC
HCT: 26.7 % — ABNORMAL LOW (ref 39.0–52.0)
Hemoglobin: 8.3 g/dL — ABNORMAL LOW (ref 13.0–17.0)
MCH: 27.1 pg (ref 26.0–34.0)
MCHC: 31.1 g/dL (ref 30.0–36.0)
MCV: 87.3 fL (ref 78.0–100.0)
RBC: 3.06 MIL/uL — ABNORMAL LOW (ref 4.22–5.81)
RDW: 16.7 % — ABNORMAL HIGH (ref 11.5–15.5)

## 2013-01-20 LAB — GLUCOSE, CAPILLARY
Glucose-Capillary: 164 mg/dL — ABNORMAL HIGH (ref 70–99)
Glucose-Capillary: 119 mg/dL — ABNORMAL HIGH (ref 70–99)

## 2013-01-20 LAB — CREATININE, SERUM
GFR calc Af Amer: 76 mL/min — ABNORMAL LOW (ref >90)
GFR calc non Af Amer: 66 mL/min — ABNORMAL LOW (ref >90)

## 2013-01-20 MED ORDER — VANCOMYCIN HCL IN DEXTROSE 1-5 GM/200ML-% IV SOLN
1000.0000 mg | Freq: Two times a day (BID) | INTRAVENOUS | Status: DC
  Administered 2013-01-20 – 2013-01-21 (×3): 1000 mg via INTRAVENOUS
  Filled 2013-01-20 (×4): qty 200

## 2013-01-20 MED ORDER — FLUCONAZOLE 100 MG PO TABS
100.0000 mg | ORAL_TABLET | Freq: Every day | ORAL | Status: DC
  Administered 2013-01-20 – 2013-01-21 (×2): 100 mg via ORAL
  Filled 2013-01-20 (×3): qty 1

## 2013-01-20 MED ORDER — PIPERACILLIN-TAZOBACTAM 3.375 G IVPB
3.3750 g | Freq: Three times a day (TID) | INTRAVENOUS | Status: DC
  Administered 2013-01-20 – 2013-01-21 (×4): 3.375 g via INTRAVENOUS
  Filled 2013-01-20 (×6): qty 50

## 2013-01-20 NOTE — Progress Notes (Signed)
Patient ID: Donald [name], male   DOB: [birth year], 67 y.o.   MRN: [id number] Subjective/Complaints: Some irritiation around wound/drain site. Moving well for the most part with therapies. A 12 point review of systems has been performed and if not noted above is otherwise negative.   Objective: Vital Signs: Blood pressure 128/64, pulse 77, temperature 98 F (36.7 C), temperature source Oral, resp. rate 18, height 5' 8.9" (1.75 m), weight 82.9 kg (182 lb 12.2 oz), SpO2 96.00%. No results found.  Recent Labs  01/20/13 0514  WBC 9.5  HGB 8.3*  HCT 26.7*  PLT 447*    Recent Labs  01/20/13 0514  CREATININE 1.12   CBG (last 3)   Recent Labs  01/19/13 1712 01/19/13 2024 01/20/13 0716  GLUCAP 105* 161* 164*    Wt Readings from Last 3 Encounters:  01/14/13 82.9 kg (182 lb 12.2 oz)  01/06/13 90.3 kg (199 lb 1.2 oz)  01/06/13 90.3 kg (199 lb 1.2 oz)    Physical Exam:  Constitutional: He is oriented to person, place, and time. No distress  HENT:  Head: Normocephalic. Tongue pink,moist, dentition good  Eyes: EOM are normal.  Neck: Normal range of motion. Neck supple. No thyromegaly present.  Cardiovascular: Normal rate and irregular rhythm. No murmur  Pulmonary/Chest: Effort normal and breath sounds normal. No respiratory distress. No wheezes  Abdominal: Soft. Bowel sounds are normal. He exhibits no distension. There is no tenderness.  Neurological: He is alert and oriented to person, place, and time.  Musc: left leg with 1++ edema, tender along incision. Scrotal edema is mild/mod Left hemiparesis, sensory deficits. Tone is 1+. Left central 7 with good tongue control. Speech clear. Cognitively with good insight and awareness.  Skin: Bypass graft site clean and dry with some surrounding erythema. Drain site still with serous drainage-- Dry dressing to left first toe   patient with chronic vascular changes in either leg   Assessment/Plan: 1. Functional deficits secondary  to deconditioning, PVD requiring LLE BPG  which require 3+ hours per day of interdisciplinary therapy in a comprehensive inpatient rehab setting. Physiatrist is providing close team supervision and 24 hour management of active medical problems listed below. Physiatrist and rehab team continue to assess barriers to discharge/monitor patient progress toward functional and medical goals. FIM: FIM - Bathing Bathing Steps Patient Completed: Chest;Left Arm;Abdomen;Front perineal area;Buttocks;Right upper leg;Left upper leg;Right lower leg (including foot) Bathing: 0: Activity did not occur  FIM - Upper Body Dressing/Undressing Upper body dressing/undressing steps patient completed: Thread/unthread right sleeve of pullover shirt/dresss;Thread/unthread left sleeve of pullover shirt/dress;Put head through opening of pull over shirt/dress;Pull shirt over trunk Upper body dressing/undressing: 6: More than reasonable amount of time FIM - Lower Body Dressing/Undressing Lower body dressing/undressing steps patient completed: Thread/unthread right pants leg;Thread/unthread left pants leg;Pull pants up/down;Don/Doff right sock;Don/Doff left shoe;Fasten/unfasten right shoe;Fasten/unfasten left shoe;Don/Doff right shoe Lower body dressing/undressing: 4: Min-Patient completed 75 plus % of tasks  FIM - Toileting Toileting steps completed by patient: Adjust clothing prior to toileting;Performs perineal hygiene;Adjust clothing after toileting (per Baird Cancer, NT) Toileting: 0: Activity did not occur  FIM - Archivist Transfers: 0-Activity did not occur  FIM - Banker Devices: Therapist, occupational: 5: Bed > Chair or W/C: Supervision (verbal cues/safety issues);4: Chair or W/C > Bed: Min A (steadying Pt. > 75%);5: Sit > Supine: Supervision (verbal cues/safety issues)  FIM - Locomotion: Wheelchair Distance: 150 Locomotion: Wheelchair: 5: Travels 150 ft  or more: maneuvers on  rugs and over door sills with supervision, cueing or coaxing FIM - Locomotion: Ambulation Locomotion: Ambulation Assistive Devices: Walker - Rolling Ambulation/Gait Assistance: 5: Supervision Locomotion: Ambulation: 2: Travels 50 - 149 ft with supervision/safety issues  Comprehension Comprehension Mode: Auditory Comprehension: 5-Understands basic 90% of the time/requires cueing < 10% of the time  Expression Expression Mode: Verbal Expression: 5-Expresses basic needs/ideas: With no assist  Social Interaction Social Interaction: 5-Interacts appropriately 90% of the time - Needs monitoring or encouragement for participation or interaction.  Problem Solving Problem Solving: 5-Solves basic problems: With no assist  Memory Memory: 6-More than reasonable amt of time  Medical Problem List and Plan:  1. Severe peripheral artery disease status post left F. 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 DVT Prophylaxis/Anticoagulation: Subcutaneous Lovenox. Monitor platelet counts and any signs of bleeding  3. Pain Management: Neurontin 300 mg 3 times a day, Percocet as needed. Monitor with increased mobility  4. Neuropsych: This patient is capable of making decisions on his own behalf.  5. Acute blood loss anemia. Followup hgb 8.4, no signs of further blood loss at present 6. Hypertension. Lisinopril 2.5 mg daily. Normotensive at present 7. Diabetes mellitus with peripheral neuropathy. Hemoglobin A1c 7.4. Check blood sugars a.c. and at bedtime  8. Tobacco abuse. Continue Zyban and provide counseling  9. Hyperlipidemia. Lovaza  10. GERD. Protonix  11. Wounds-continue current dressing to drain site and surrounding area.   -erythema noted around incision  -keflex initiated  -may have a fungal component---add diflucan also  -will request surgery follow up  -no leukocytosis  LOS (Days) 7 A FACE TO  FACE EVALUATION WAS PERFORMED  Donald [name] 01/20/2013 7:39 AM

## 2013-01-20 NOTE — Progress Notes (Signed)
Physical Therapy Discharge Summary  Patient Details  Name: Donald [name] MRN: [id number] Date of Birth: [birth year]  Today's Date: 01/20/2013 Time: 1130-1200 Time Calculation (min): 30 min Session focused on family education. Wife present and was able to provide supervision for pt during ambulation with RW x 150' providing cues for increased distance, stairs x 10 with Rt. Railing, car transfer x 2. Wife provided with cues on positioning and ways to reduce risk of falls in the home. Pt modified independent with wheelchair propulsion. No further questions from wife or pt, both feel ready to go home.  Patient has met 9 of 9 long term goals due to improved activity tolerance, improved balance, improved postural control, increased strength and ability to compensate for deficits.  Patient to discharge at an ambulatory level with RW and Supervision.   Pt has made good progress while on rehab and demonstrates good safety awareness. He will benefit from supervision with ambulation due to tendency to perform tasks a little too quickly and due to impaired dynamic balance. Patient's care partner is independent to provide the necessary physical assistance at discharge.  Reasons goals not met: NA  Recommendation:  Patient will benefit from ongoing skilled PT services in home health setting to continue to advance safe functional mobility, address ongoing impairments in impaired standing balance, decreased functional mobility, increased need for restrictive assistive device as compared to baseline, and minimize fall risk.  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 Restrictions Weight Bearing Restrictions: No Pain Pain Assessment Pain Score: 6   Faces Pain Scale: Hurts little more Pain Type: Surgical pain Pain Location: Groin Pain Orientation: Left Pain Descriptors / Indicators: Aching;Burning Pain Onset: With Activity Pain Intervention(s): Repositioned (rest as needed)  Cognition Orientation Level: Oriented X4 Sensation Sensation Light Touch: Impaired by gross assessment (LUE, LtLE) Additional Comments: Sensation diminished dorsal aspect of B feet Coordination Gross Motor Movements are Fluid and Coordinated: No Fine Motor Movements are Fluid and Coordinated: No Coordination and Movement Description: LUE/LLE with premorbid deficits Heel Shin Test: Slight dysmetria noted L LE-residual deficits from old CVA Motor  Motor Motor: Abnormal tone;Abnormal postural alignment and control;Hemiplegia ("hemiparesis") Motor - Skilled Clinical Observations: L UE spasticity from old CVA  Mobility Bed Mobility Bed Mobility: Not assessed Transfers Transfers: Yes Sit to Stand: With upper extremity assist;With armrests;From chair/3-in-1;From bed;5: Supervision Stand to Sit: With upper extremity assist;With armrests;To chair/3-in-1;To bed;5: Supervision Stand to Sit Details: Occasional cues for hand placement Stand Pivot Transfers: With armrests;5: Supervision Locomotion  Ambulation Ambulation: Yes Ambulation/Gait Assistance: 5: Supervision Ambulation Distance (Feet): 150 Feet Assistive device: Rolling walker Ambulation/Gait Assistance Details: Max cues for distance, low endurance noted Gait Gait Pattern: Impaired Gait Pattern: Step-to pattern;Decreased step length - left;Decreased weight shift to left;Shuffle;Trunk flexed;Left circumduction;Decreased dorsiflexion - left Stairs / Additional Locomotion Stairs: Yes Stairs Assistance: 5: Supervision Stair Management Technique: One rail Right;Step to pattern;Forwards Number of Stairs: Teacher, English as a foreign language: Yes Wheelchair Assistance: 6: Modified independent  (Device/Increase time) Occupational hygienist: Right upper extremity;Right lower extremity Wheelchair Parts Management: Needs assistance Distance: 150  Trunk/Postural Assessment  Cervical Assessment Cervical Assessment: Exceptions to [facility] (forward head posture) Thoracic Assessment Thoracic Assessment: Within Functional Limits Lumbar Assessment Lumbar Assessment: Within Functional Limits Postural Control Postural Control: Deficits on evaluation (residual deficits from old CVA)  Balance Balance  Balance Assessed: Yes Static Sitting Balance Static Sitting - Balance Support: No upper extremity supported;Feet supported Static Sitting - Level of Assistance: 6: Modified independent (Device/Increase time) Static Standing Balance Static Standing - Balance Support: Bilateral upper extremity supported;During functional activity Static Standing - Level of Assistance: 5: Stand by assistance Extremity Assessment      RLE Assessment RLE Assessment: Within Functional Limits (Grossly 4/5) LLE Assessment LLE Assessment: Exceptions to [facility] LLE Strength LLE Overall Strength: Deficits;Due to pain;Due to premorbid status LLE Overall Strength Comments: Residal deficits from prior CVA, MMT not performed secondary to pain in incision site. Functionally improving in strength but weaker than Rt.  See FIM for current functional status  Wilhemina Bonito 01/20/2013, 12:32 PM

## 2013-01-20 NOTE — Progress Notes (Signed)
Occupational Therapy Session Note  Patient Details  Name: Donald [name] MRN: [id number] Date of Birth: June 10, 1945  Today's Date: 01/20/2013  Session 1 Time: 0900-1000 Time Calculation (min): 60 min  Short Term Goals: Week 1:  OT Short Term Goal 1 (Week 1): STG=LTGs  Skilled Therapeutic Interventions/Progress Updates:    Pt sitting EOB with wife at bedside upon arrival.  Wife present throughout session and provided appropriate level of assistance and supervision for bathing and dressing tasks.  [name] state on numerous occasions that she provided same level of care/assistance "the last time" and her main objective was to make sure her husband was safe.  Focus on activity tolerance, safety awareness, standing balance, and functional ambulation with RW and SPC.  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  Therapy session focus on continued family education with functional ambulation with RW and SPC, shower transfers, bed mobility, and safety awareness.  Pt stated that his bathroom at home was not large enough to access with RW and he had to use SPC.  Pt and wife practiced limited ambulation in small area with SPC to simulate home environment.  [name] demonstrated appropriate level of supervision and assistance throughout session.  Pt and wife pleased with progress and "ready to go home."  Rich Brave 01/20/2013, 3:47 PM

## 2013-01-20 NOTE — Plan of Care (Signed)
Problem: RH SKIN INTEGRITY Goal: RH STG SKIN FREE OF INFECTION/BREAKDOWN Skin will remain intact during admission with MIN assist  Outcome: Not Progressing Redness to uppermost set of staples/and c/o stinging  At area/culture of incision at groin and pt started on keflex

## 2013-01-20 NOTE — Plan of Care (Signed)
Problem: RH SKIN INTEGRITY Goal: RH STG SKIN FREE OF INFECTION/BREAKDOWN Skin will remain intact during admission with MIN assist  Outcome: Not Progressing Pt has stage II to sacrum and incision to left groin reddened and painful. Infection suspected

## 2013-01-20 NOTE — Progress Notes (Signed)
Nursing Note:1.  Pt has dressing at upper L inner thigh w/ dry yellow drainage on old dressing. 2. Pt has dressing to L groin where sutures are in place, moist ,yellow w/ mild odor.wbb

## 2013-01-20 NOTE — Progress Notes (Signed)
Occupational Therapy Discharge Summary  Patient Details  Name: Donald [name] MRN: [id number] Date of Birth: [birth year]  Today's Date: 01/20/2013  Patient has met 7 of 7 long term goals due to improved activity tolerance, improved balance, postural control, ability to compensate for deficits, improved attention, improved awareness and improved coordination.  Pt made steady progress with bathing, dressing, toilet transfers, toileting, and shower transfers.  Pt continues to experience increased pain in LLE/groing area with activity and requires multiple rest breaks during tasks.  [name] has been present for family education and has demonstrated appropriate level of assistance and supervision. Pt requires supervison/min A for BADLs at sink level.  Pt has not been able to take a shower secondary to multiple staples in LLE.   Recommendation:  Patient will benefit from ongoing skilled OT services in home health setting to continue to advance functional skills in the area of BADL, iADL and Reduce care partner burden.  Equipment: No equipment provided Pt already owns [facility] and shower seat  Reasons for discharge: treatment goals met and discharge from hospital  Patient/family agrees with progress made and goals achieved: Yes  Precautions/Restrictions  Precautions Precautions: Fall Precaution Comments: residual weakness L side-old CVA Restrictions Weight Bearing Restrictions: No  ADL ADL Eating: Supervision/safety Grooming: Modified independent Upper Body Bathing: Minimal assistance Lower Body Bathing: Minimal assistance Upper Body Dressing: Modified independent (Device) Lower Body Dressing: Minimal assistance Toileting: Supervision/safety Toilet Transfer: Close supervision Film/video editor: Minimal assistance Astronomer: Shower seat with back  Vision/Perception  Vision - History Baseline Vision: Wears glasses all the time Patient Visual Report: No change from  baseline   Cognition Overall Cognitive Status: Within Functional Limits for tasks assessed Arousal/Alertness: Awake/alert Orientation Level: Oriented X4 Awareness: Appears intact Problem Solving: Appears intact Safety/Judgment: Appears intact  Sensation Sensation Light Touch: Impaired by gross assessment Proprioception: Appears Intact Additional Comments: Sensation diminished dorsal aspect of B feet Coordination Gross Motor Movements are Fluid and Coordinated: No Fine Motor Movements are Fluid and Coordinated: No Coordination and Movement Description: LUE/LLE with premorbid deficits Heel Shin Test: Slight dysmetria noted L LE-residual deficits from old CVA  Motor  Motor Motor: Abnormal tone;Abnormal postural alignment and control;Hemiplegia Motor - Skilled Clinical Observations: L UE spasticity from old CVA  Mobility  Bed Mobility Bed Mobility: Supine to Sit;Sit to Supine Supine to Sit: 6: Modified independent (Device/Increase time);HOB flat Sit to Supine: 6: Modified independent (Device/Increase time);HOB flat Transfers Sit to Stand: With upper extremity assist;With armrests;From chair/3-in-1;From bed;5: Supervision Stand to Sit: With upper extremity assist;With armrests;To chair/3-in-1;To bed;5: Supervision Stand to Sit Details: Occasional cues for hand placement   Trunk/Postural Assessment  Cervical Assessment Cervical Assessment: Exceptions to [facility] Thoracic Assessment Thoracic Assessment: Within Functional Limits Lumbar Assessment Lumbar Assessment: Within Functional Limits Postural Control Postural Control: Deficits on evaluation (residual deficits from old CVA)   Balance Balance Balance Assessed: Yes Static Sitting Balance Static Sitting - Balance Support: No upper extremity supported;Feet supported Static Sitting - Level of Assistance: 6: Modified independent (Device/Increase time) Static Standing Balance Static Standing - Balance Support: Bilateral upper  extremity supported;During functional activity Static Standing - Level of Assistance: 5: Stand by assistance  Extremity/Trunk Assessment RUE Assessment RUE Assessment: Within Functional Limits LUE Assessment LUE Assessment: Exceptions to [facility]  See FIM for current functional status  Rich Brave 01/20/2013, 4:11 PM

## 2013-01-20 NOTE — Progress Notes (Signed)
ANTIBIOTIC CONSULT NOTE - INITIAL  Pharmacy Consult for Vancomycin and Zosyn Indication: Infected Left Groin Wound  Allergies  Allergen Reactions  . Other     Became hypotensive. Pt states allergic to "muscle relaxer" but unsure of name.  . Tape Hives and Itching    medfix non woven tape    Patient Measurements: Height: 5' 8.9" (175 cm) Weight: 182 lb 12.2 oz (82.9 kg) IBW/kg (Calculated) : 70.47 Adjusted Body Weight:   Vital Signs: Temp: 98.4 F (36.9 C) (09/23 1505) Temp src: Oral (09/23 1505) BP: 138/49 mmHg (09/23 1505) Pulse Rate: 91 (09/23 1505) Intake/Output from previous day: 09/22 0701 - 09/23 0700 In: 1080 [P.O.:1080] Out: 825 [Urine:825] Intake/Output from this shift: Total I/O In: 960 [P.O.:960] Out: 150 [Urine:150]  Labs:  Recent Labs  01/20/13 0514  WBC 9.5  HGB 8.3*  PLT 447*  CREATININE 1.12   Estimated Creatinine Clearance: 62.9 ml/min (by C-G formula based on Cr of 1.12). No results found for this basename: VANCOTROUGH, Leodis Binet, VANCORANDOM, GENTTROUGH, GENTPEAK, GENTRANDOM, TOBRATROUGH, TOBRAPEAK, TOBRARND, AMIKACINPEAK, AMIKACINTROU, AMIKACIN,  in the last 72 hours   Microbiology: Recent Results (from the past 720 hour(s))  SURGICAL PCR SCREEN     Status: Abnormal   Collection Time    01/02/13 12:08 PM      Result Value Range Status   MRSA, PCR NEGATIVE  NEGATIVE Final   Staphylococcus aureus POSITIVE (*) NEGATIVE Final   Comment:            The Xpert SA Assay (FDA     approved for NASAL specimens     in patients over 15 years of age),     is one component of     a comprehensive surveillance     program.  Test performance has     been validated by [employer] for patients greater     than or equal to 4 year old.     It is not intended     to diagnose infection nor to     guide or monitor treatment.  WOUND CULTURE     Status: None   Collection Time    01/10/13  8:39 AM      Result Value Range Status   Specimen  Description WOUND LEFT GROIN   Final   Special Requests NONE   Final   Gram Stain     Final   Value: MODERATE WBC PRESENT, PREDOMINANTLY PMN     NO SQUAMOUS EPITHELIAL CELLS SEEN     NO ORGANISMS SEEN     Performed at [facility]   Culture     Final   Value: NO GROWTH 2 DAYS     Performed at [facility]   Report Status 01/12/2013 FINAL   Final  MRSA PCR SCREENING     Status: None   Collection Time    01/10/13  3:53 PM      Result Value Range Status   MRSA by PCR NEGATIVE  NEGATIVE Final   Comment:            The GeneXpert MRSA Assay (FDA     approved for NASAL specimens     only), is one component of a     comprehensive MRSA colonization     surveillance program. It is not     intended to diagnose MRSA     infection nor to guide or     monitor treatment for  MRSA infections.  WOUND CULTURE     Status: None   Collection Time    01/19/13 11:30 PM      Result Value Range Status   Specimen Description WOUND LEFT GROIN   Final   Special Requests NONE   Final   Gram Stain     Final   Value: NO WBC SEEN     NO SQUAMOUS EPITHELIAL CELLS SEEN     ABUNDANT GRAM POSITIVE COCCI IN PAIRS     ABUNDANT GRAM NEGATIVE RODS     Performed at [facility]   Culture     Final   Value: NO GROWTH 1 DAY     Performed at [facility]   Report Status PENDING   Incomplete    Medical History: Past Medical History  Diagnosis Date  . GERD (gastroesophageal reflux disease)   . Hyperlipidemia   . Hypertension   . Peripheral vascular disease   . Type II diabetes mellitus   . Pneumonia   . Internal and external bleeding hemorrhoids   . Stroke 2008    "left side weak; unable to move left hand still" (01/23/2012)  . History of gout     left great toe  . anal ca dx'd 08/2007    S/P chemo, radiation, biopsies  . 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 Depression   . Numbness in right leg     Hx: of diabetes  . Coronary artery disease     RCA occlusion with  good collaterals, o/w no obs CAD 12/2011    Medications:  Scheduled:  . acidophilus  1 capsule Oral Daily  . buPROPion  150 mg Oral Daily  . dipyridamole-aspirin  1 capsule Oral BID  . enoxaparin (LOVENOX) injection  40 mg Subcutaneous Q24H  . fluconazole  100 mg Oral Daily  . gabapentin  300 mg Oral TID  . insulin aspart  0-15 Units Subcutaneous TID WC  . lisinopril  2.5 mg Oral Daily  . metFORMIN  500 mg Oral BID WC  . omega-3 acid ethyl esters  2 g Oral BID  . pantoprazole  40 mg Oral Daily  . piperacillin-tazobactam (ZOSYN)  IV  3.375 g Intravenous Q8H  . senna-docusate  2 tablet Oral QHS  . sodium chloride  10-40 mL Intracatheter Q12H  . vancomycin  1,000 mg Intravenous Q12H   Assessment: 67 yr old male in rehab for reconditioning has developed a draining infection in the groin area where he has staples and stitches from a previous surgery for an occlusion of his left femoral popliteal.  Staples have been removed and a drain has been places. Pt to receive Vanc and Zosyn until cultures are back.  Goal of Therapy:  Vancomycin trough level 10-15 mcg/ml  Plan:  Pt has been started on Zosyn and Vanc until cultures are returned. Will f/u cultures and check a vanc level when appropriate.   Eugene Garnet 01/20/2013,4:47 PM

## 2013-01-20 NOTE — Progress Notes (Signed)
Nursing Note: Pt c/o pain to l upper leg area into his groin. Pt states he has stinging pain.Assessed skin and noted that staples that are inside L leg at skin fold are wet,w/ light yellow thin drainage and malodor noted.A: Notified on-call and obtained order to culture,cbc in am,cleanse wound and apply betadine and to start keflex 500 mg every 8 hrs po.wbb

## 2013-01-20 NOTE — Progress Notes (Signed)
Physical Therapy Session Note  Patient Details  Name: Donald [name] MRN: [id number] Date of Birth: [birth year]  Today's Date: 01/20/2013 Time: 1130-1200 Time Calculation (min): 30 min  Short Term Goals: Week 1:  PT Short Term Goal 1 (Week 1): STGs=LTGs  Skilled Therapeutic Interventions/Progress Updates:    Patient received sitting in wheelchair, wife present for remainder of family education. Session focused on household ambulation, bed mobility, and curb negotiation; see details below. Patient returned to room and left seated in wheelchair with all needs within reach and wife present.  Therapy Documentation Precautions:  Precautions Precautions: Fall Precaution Comments: residual weakness L side-old CVA Restrictions Weight Bearing Restrictions: No Pain: Pain Assessment Pain Assessment: No/denies pain Pain Score: 0-No pain Mobility: Bed Mobility Bed Mobility: Supine to Sit;Sit to Supine Supine to Sit: 6: Modified independent (Device/Increase time);HOB flat Sit to Supine: 6: Modified independent (Device/Increase time);HOB flat Transfers Transfers: Yes Sit to Stand: With upper extremity assist;With armrests;From chair/3-in-1;From bed;5: Supervision Stand to Sit: With upper extremity assist;With armrests;To chair/3-in-1;To bed;5: Supervision Stand to Sit Details: Occasional cues for hand placement Stand Pivot Transfers: With armrests;5: Supervision Locomotion : Ambulation Ambulation: Yes Ambulation/Gait Assistance: 5: Supervision Ambulation Distance (Feet): 25 Feet Assistive device: Rolling walker (with L hand orthosis) Ambulation/Gait Assistance Details: Verbal cues for precautions/safety;Verbal cues for safe use of DME/AE Ambulation/Gait Assistance Details: Patient instructed in gait training x 3 trials 20-25' in household environment (on carpet, negotiating thresholds, in confined spaces) in ADL apartment and ADL kitchen. Patient's wife provides appropriate supervision  level of assistance without verbal cues from  PT. Gait Gait Pattern: Impaired Gait Pattern: Step-to pattern;Decreased step length - left;Decreased weight shift to left;Shuffle;Trunk flexed;Left circumduction;Decreased dorsiflexion - left Curb: 5: Supervision (w/ RW w/ L hand orthosis; x2 trials; Patient's wife provides appropriate supervision level of assistance without verbal cues from  PT.)   See FIM for current functional status  Therapy/Group: Individual Therapy  Chipper Herb. Denika Krone, PT, DPT 01/20/2013, 12:38 PM

## 2013-01-20 NOTE — Progress Notes (Signed)
D/c staples as MD ordered; area tender to touch, red and swollen. Left groin 3 steri strips applied due to incision opening up.  Changed 4x4 covering wet to dry packing due to drainage. Odor present.  Will continue to monitor.

## 2013-01-20 NOTE — Progress Notes (Addendum)
Pt with left groin drainage.  Culture taken today and started on antibiotics  Left groin yellow serous fluid.  2 nylon sutures removed and wound packed Will recheck tomorrow if overall clean with place VAC.  May need readmission to [facility] if discharge is pending for tomorrow NS wet to dry to left groin BID Vanc and Zosyn until culture is back Will d/c staples in leg but leave nylon sutures in groin   Fabienne Bruns, MD [facility] Office: [phone] Pager: [phone]

## 2013-01-21 ENCOUNTER — Inpatient Hospital Stay (HOSPITAL_COMMUNITY)
Admission: AD | Admit: 2013-01-21 | Discharge: 2013-02-06 | DRG: 863 | Disposition: A | Discharge status: Home / Self Care | Payer: Medicare Other | Source: Ambulatory Visit | Attending: Vascular Surgery | Admitting: Vascular Surgery

## 2013-01-21 DIAGNOSIS — I739 Peripheral vascular disease, unspecified: Secondary | ICD-10-CM | POA: Diagnosis present

## 2013-01-21 DIAGNOSIS — E785 Hyperlipidemia, unspecified: Secondary | ICD-10-CM | POA: Diagnosis present

## 2013-01-21 DIAGNOSIS — T8140XA Infection following a procedure, unspecified, initial encounter: Principal | ICD-10-CM | POA: Diagnosis present

## 2013-01-21 DIAGNOSIS — R5381 Other malaise: Secondary | ICD-10-CM | POA: Diagnosis present

## 2013-01-21 DIAGNOSIS — R29898 Other symptoms and signs involving the musculoskeletal system: Secondary | ICD-10-CM | POA: Diagnosis present

## 2013-01-21 DIAGNOSIS — L97509 Non-pressure chronic ulcer of other part of unspecified foot with unspecified severity: Secondary | ICD-10-CM | POA: Diagnosis present

## 2013-01-21 DIAGNOSIS — I96 Gangrene, not elsewhere classified: Secondary | ICD-10-CM | POA: Diagnosis present

## 2013-01-21 DIAGNOSIS — I2510 Atherosclerotic heart disease of native coronary artery without angina pectoris: Secondary | ICD-10-CM | POA: Diagnosis present

## 2013-01-21 DIAGNOSIS — I10 Essential (primary) hypertension: Secondary | ICD-10-CM | POA: Diagnosis present

## 2013-01-21 DIAGNOSIS — F3289 Other specified depressive episodes: Secondary | ICD-10-CM | POA: Diagnosis present

## 2013-01-21 DIAGNOSIS — I69998 Other sequelae following unspecified cerebrovascular disease: Secondary | ICD-10-CM

## 2013-01-21 DIAGNOSIS — E119 Type 2 diabetes mellitus without complications: Secondary | ICD-10-CM | POA: Diagnosis present

## 2013-01-21 DIAGNOSIS — K219 Gastro-esophageal reflux disease without esophagitis: Secondary | ICD-10-CM | POA: Diagnosis present

## 2013-01-21 DIAGNOSIS — F329 Major depressive disorder, single episode, unspecified: Secondary | ICD-10-CM | POA: Diagnosis present

## 2013-01-21 DIAGNOSIS — M109 Gout, unspecified: Secondary | ICD-10-CM | POA: Diagnosis present

## 2013-01-21 DIAGNOSIS — R197 Diarrhea, unspecified: Secondary | ICD-10-CM | POA: Diagnosis not present

## 2013-01-21 DIAGNOSIS — F17200 Nicotine dependence, unspecified, uncomplicated: Secondary | ICD-10-CM | POA: Diagnosis present

## 2013-01-21 HISTORY — DX: Personal history of other medical treatment: Z92.89

## 2013-01-21 HISTORY — DX: Malignant neoplasm of rectum: C20

## 2013-01-21 LAB — GLUCOSE, CAPILLARY
Glucose-Capillary: 165 mg/dL — ABNORMAL HIGH (ref 70–99)
Glucose-Capillary: 111 mg/dL — ABNORMAL HIGH (ref 70–99)

## 2013-01-21 LAB — WOUND CULTURE: Gram Stain: NONE SEEN

## 2013-01-21 MED ORDER — VANCOMYCIN HCL IN DEXTROSE 1-5 GM/200ML-% IV SOLN
1000.0000 mg | Freq: Two times a day (BID) | INTRAVENOUS | Status: DC
  Administered 2013-01-22 – 2013-01-23 (×3): 1000 mg via INTRAVENOUS
  Filled 2013-01-21 (×4): qty 200

## 2013-01-21 MED ORDER — PIPERACILLIN-TAZOBACTAM 3.375 G IVPB
3.3750 g | Freq: Three times a day (TID) | INTRAVENOUS | Status: DC
  Administered 2013-01-22 – 2013-02-03 (×38): 3.375 g via INTRAVENOUS
  Filled 2013-01-21 (×41): qty 50

## 2013-01-21 NOTE — Progress Notes (Signed)
Left groin drainage overnight Filed Vitals:   01/19/13 1300 01/20/13 0653 01/20/13 1505 01/21/13 0619  BP: 98/65 128/64 138/49 136/73  Pulse: 86 77 91 73  Temp: 97.6 F (36.4 C) 98 F (36.7 C) 98.4 F (36.9 C) 98 F (36.7 C)  TempSrc: Oral Oral Oral Oral  Resp: 18 18 18 19   Height:      Weight:      SpO2: 98% 96% 93% 95%   Left groin wound clean.  Serous drainage no exposed graft  Will readmit to [facility] today Wound VAC groin Continue antibiotics for now Culture showed multiple organisms ? Skin contaminant   Fabienne Bruns, MD [facility] Office: [phone] Pager: [phone]

## 2013-01-21 NOTE — Progress Notes (Signed)
Social Work  Discharge Note  The overall goal for the admission was met for:   Discharge location: No - transfer back to acute  Length of Stay: Yes - met for CIR = 8 days  Discharge activity level: Yes - supervision to modified independent  Home/community participation: Yes  Services provided included: MD, RD, PT, OT, RN, Pharmacy and SW  Financial Services: Medicare and Private Insurance: [employer]  Follow-up services arranged: Home Health: RN, PT, OT via [facility] and Patient/Family request agency HH: [facility]ev[facility] Norlander, DME: NA  Comments (or additional information):  Patient/Family verbalized understanding of follow-up arrangements: Yes  Individual responsible for coordination of the follow-up plan: patient  Confirmed correct DME delivered: NA (already has needed DME)    Chae Oommen

## 2013-01-21 NOTE — Progress Notes (Signed)
Social Work Patient ID: Donald [name], male   DOB: 1945/12/16, 67 y.o.   MRN: [id number]  Donald Jupiter, LCSW Social Worker Signed  Patient Care Conference Service date: 01/21/2013 8:24 AM  Inpatient RehabilitationTeam Conference and Plan of Care Update Date: 01/20/2013   Time: 2:25 PM     Patient Name: Donald [name]       Medical Record Number: [id number]   Date of Birth: [birth year] Sex: Male         Room/Bed: 4W01C/4W01C-01 Payor Info: Payor: MEDICARE / Plan: MEDICARE PART A AND B / Product Type: *No Product type* /   Admitting Diagnosis: deconditioned l fpbpa   Admit Date/Time:  01/13/2013  4:12 PM Admission Comments: No comment available   Primary Diagnosis:  PVD (peripheral vascular disease) with claudication Principal Problem: PVD (peripheral vascular disease) with claudication    Patient Active Problem List     Diagnosis  Date Noted   .  PVD (peripheral vascular disease) with claudication  01/14/2013   .  Hemiparesis affecting left side as late effect of stroke  01/05/2013   .  Abnormality of gait  09/03/2012   .  Other acquired deformity of ankle and foot(736.79)  09/03/2012   .  Aftercare following surgery of the circulatory system, NEC  05/15/2012   .  Atherosclerosis of native arteries of the extremities with ulceration(440.23)  04/10/2012   .  Peripheral vascular disease, unspecified  03/06/2012   .  PVD (peripheral vascular disease)  02/21/2012   .  Occlusion and stenosis of carotid artery without mention of cerebral infarction  02/21/2012   .  Physical deconditioning  01/31/2012   .  Atherosclerotic peripheral vascular disease with rest pain  01/24/2012   .  Preoperative evaluation to rule out surgical contraindication  01/24/2012   .  Anal carcinoma  10/30/2011   .  Anal cancer  02/26/2011   .  SEC MALIGNANT NEOPLASM OF LARGE INTESTINE&RECTUM  11/09/2008   .  NAUSEA AND VOMITING  11/09/2008   .  RECTAL MASS  08/30/2008   .  CONSTIPATION  08/18/2008   .   RECTAL BLEEDING  08/18/2008   .  FLATULENCE-GAS-BLOATING  08/18/2008   .  ABDOMINAL PAIN -GENERALIZED  08/18/2008   .  DM  08/17/2008   .  HYPERLIPIDEMIA  08/17/2008   .  HEMORRHOIDS  08/17/2008   .  CONSTIPATION, CHRONIC  08/17/2008   .  CEREBROVASCULAR ACCIDENT, HX OF  08/17/2008     Expected Discharge Date: Expected Discharge Date: 01/21/13  Team Members Present: Physician leading conference: Dr. Faith Rogue Social Worker Present: Donald Jupiter, LCSW Nurse Present: Kennon Portela, RN PT Present: Sherrine Maples, Grayland Ormond, PT OT Present: Edwin Cap, Loistine Chance, OT PPS Coordinator present : Tora Duck, RN, CRRN        Current Status/Progress  Goal  Weekly Team Focus   Medical     s/p LLE BPG. pain, old cva  stabilize medically for dc  wound care, post-op mgt   Bowel/Bladder     pt continent of bowel and bladder       Swallow/Nutrition/ Hydration            ADL's     supervision/min A overall; supevision transfers  min A/supervision overall  family educaiton; activity tolerance   Mobility     supervision  supervision  family education, preparation for D/C   Communication            Safety/Cognition/ Behavioral  Observations           Pain     Pt complains of aching/burning pain to left grion. 2Percocet given PRN Q4 hrs.  3 or less  Assess pain and medicate as needed; monitor   Skin     pt has staples to left leg to groin.  Area around staples reddned, swollen and tender to touch. Left groin has sutures inplace with serous driange and mild yellow drianage present. Guaze changed as ordered; yeast to groin-area cleaned and mgp applied; left toe has black area present, guaze inplace; stage II to sacrum  with foam applied; right chest portacath   prevent further breakdown while on rehab; say free infection to incision sites  monitor s/s of infection; assess skin qshift; change dressing as ordered and PRN    Rehab Goals Patient on target to meet rehab goals:  Yes *See Care Plan and progress notes for long and short-term goals.    Barriers to Discharge:  none   Possible Resolutions to Barriers:    none     Discharge Planning/Teaching Needs:    home with wife who can provide 24/7 assistance      Team Discussion:    Therapies feel pt is ready for d/c home and family education has been completed.  Wound issues continue.  Changed to po abx. Await word from CVTS about incision.  Hope to be able to d/c tomorrow.   Revisions to Treatment Plan:    None    Continued Need for Acute Rehabilitation Level of Care: The patient requires daily medical management by a physician with specialized training in physical medicine and rehabilitation for the following conditions: Daily direction of a multidisciplinary physical rehabilitation program to ensure safe treatment while eliciting the highest outcome that is of practical value to the patient.: Yes Daily medical management of patient stability for increased activity during participation in an intensive rehabilitation regime.: Yes Daily analysis of laboratory values and/or radiology reports with any subsequent need for medication adjustment of medical intervention for : Other;Neurological problems;Post surgical problems  Airica Schwartzkopf 01/21/2013, 8:24 AM

## 2013-01-21 NOTE — Progress Notes (Signed)
Patient ID: Donald [name], male   DOB: 10-16-1945, 67 y.o.   MRN: [id number] Subjective/Complaints: Drainage, pain around inguinal wound A 12 point review of systems has been performed and if not noted above is otherwise negative.   Objective: Vital Signs: Blood pressure 136/73, pulse 73, temperature 98 F (36.7 C), temperature source Oral, resp. rate 19, height 5' 8.9" (1.75 m), weight 82.9 kg (182 lb 12.2 oz), SpO2 95.00%. No results found.  Recent Labs  01/20/13 0514  WBC 9.5  HGB 8.3*  HCT 26.7*  PLT 447*    Recent Labs  01/20/13 0514  CREATININE 1.12   CBG (last 3)   Recent Labs  01/20/13 1636 01/20/13 2056 01/21/13 0729  GLUCAP 119* 165* 165*    Wt Readings from Last 3 Encounters:  01/14/13 82.9 kg (182 lb 12.2 oz)  01/06/13 90.3 kg (199 lb 1.2 oz)  01/06/13 90.3 kg (199 lb 1.2 oz)    Physical Exam:  Constitutional: He is oriented to person, place, and time. No distress  HENT:  Head: Normocephalic. Tongue pink,moist, dentition good  Eyes: EOM are normal.  Neck: Normal range of motion. Neck supple. No thyromegaly present.  Cardiovascular: Normal rate and irregular rhythm. No murmur  Pulmonary/Chest: Effort normal and breath sounds normal. No respiratory distress. No wheezes  Abdominal: Soft. Bowel sounds are normal. He exhibits no distension. There is no tenderness.  Neurological: He is alert and oriented to person, place, and time.  Musc: left leg with 1++ edema, tender along incision. Scrotal edema is mild/mod Left hemiparesis, sensory deficits. Tone is 1+. Left central 7 with good tongue control. Speech clear. Cognitively with good insight and awareness.  Skin: Bypass graft site clean and dry with some surrounding erythema. Drain site still with drainage, tender--  patient with chronic vascular changes in either leg   Assessment/Plan: 1. Functional deficits secondary to deconditioning, PVD requiring LLE BPG  which require 3+ hours per day of  interdisciplinary therapy in a comprehensive inpatient rehab setting. Physiatrist is providing close team supervision and 24 hour management of active medical problems listed below. Physiatrist and rehab team continue to assess barriers to discharge/monitor patient progress toward functional and medical goals. FIM: FIM - Bathing Bathing Steps Patient Completed: Chest;Left Arm;Abdomen;Front perineal area;Buttocks;Right upper leg;Left upper leg;Right lower leg (including foot) Bathing: 4: Min-Patient completes 8-9 28f 10 parts or 75+ percent  FIM - Upper Body Dressing/Undressing Upper body dressing/undressing steps patient completed: Thread/unthread right sleeve of pullover shirt/dresss;Thread/unthread left sleeve of pullover shirt/dress;Put head through opening of pull over shirt/dress;Pull shirt over trunk Upper body dressing/undressing: 6: More than reasonable amount of time FIM - Lower Body Dressing/Undressing Lower body dressing/undressing steps patient completed: Thread/unthread right pants leg;Thread/unthread left pants leg;Pull pants up/down;Don/Doff right sock;Don/Doff left shoe;Fasten/unfasten right shoe;Fasten/unfasten left shoe;Don/Doff right shoe Lower body dressing/undressing: 4: Min-Patient completed 75 plus % of tasks  FIM - Toileting Toileting steps completed by patient: Adjust clothing prior to toileting;Performs perineal hygiene;Adjust clothing after toileting Toileting: 5: Supervision: Safety issues/verbal cues  FIM - Archivist Transfers Assistive Devices: Walker;Grab bars;Elevated toilet seat Toilet Transfers: 5-To toilet/BSC: Supervision (verbal cues/safety issues);5-From toilet/BSC: Supervision (verbal cues/safety issues)  FIM - Banker Devices: Walker;Orthosis (L hand orthosis) Bed/Chair Transfer: 5: Bed > Chair or W/C: Supervision (verbal cues/safety issues);5: Chair or W/C > Bed: Supervision (verbal cues/safety  issues);6: Supine > Sit: No assist;6: Sit > Supine: No assist  FIM - Locomotion: Wheelchair Distance: 150 Locomotion: Wheelchair: 1: Total Assistance/staff  pushes wheelchair (Pt<25%) FIM - Locomotion: Ambulation Locomotion: Ambulation Assistive Devices: Walker - Rolling;Orthosis (L hand orthosis) Ambulation/Gait Assistance: 5: Supervision Locomotion: Ambulation: 2: Travels 50 - 149 ft with supervision/safety issues  Comprehension Comprehension Mode: Auditory Comprehension: 5-Follows basic conversation/direction: With no assist  Expression Expression Mode: Verbal Expression: 5-Set-up assist with assistive device  Social Interaction Social Interaction: 5-Interacts appropriately 90% of the time - Needs monitoring or encouragement for participation or interaction.  Problem Solving Problem Solving: 5-Solves basic problems: With no assist  Memory Memory: 6-More than reasonable amt of time  Medical Problem List and Plan:  1. Severe peripheral artery disease status post left F. 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 DVT Prophylaxis/Anticoagulation: Subcutaneous Lovenox. Monitor platelet counts and any signs of bleeding  3. Pain Management: Neurontin 300 mg 3 times a day, Percocet as needed. Monitor with increased mobility  4. Neuropsych: This patient is capable of making decisions on his own behalf.  5. Acute blood loss anemia. Followup hgb 8.4, no signs of further blood loss at present 6. Hypertension. Lisinopril 2.5 mg daily. Normotensive at present 7. Diabetes mellitus with peripheral neuropathy. Hemoglobin A1c 7.4. Check blood sugars a.c. and at bedtime  8. Tobacco abuse. Continue Zyban and provide counseling  9. Hyperlipidemia. Lovaza  10. GERD. Protonix  11. Wounds-on vanc and zosyn per surgery  -vac eval per woc rn  -may need to go back to acute to complete surgical/medical care--will discuss with dr.  fields  LOS (Days) 8 A FACE TO FACE EVALUATION WAS PERFORMED  Barnabas Henriques T 01/21/2013 8:01 AM

## 2013-01-21 NOTE — Discharge Summary (Signed)
NAME:  Donald [name], RADLER                 ACCOUNT NO.:  MEDICAL RECORD NO.:  [id number]  LOCATION:                                 FACILITY:  PHYSICIAN:  Ranelle Oyster, M.D.DATE OF BIRTH:  Aug 07, 1945  DATE OF ADMISSION: DATE OF DISCHARGE:                              DISCHARGE SUMMARY   DISCHARGE DIAGNOSES:  Weakness with impaired gait, severe peripheral vascular disease, status post left SPVG as well as history of cerebrovascular accident with left hemiparesis, pain management, acute blood loss anemia, hypertension, diabetes mellitus, tobacco abuse, and gastroesophageal reflux disease.  HISTORY OF PRESENT ILLNESS:  This is a 67 year old right-handed male well known to rehab services from 2013 for deconditioning secondary to multiple medical issues as well as history of peripheral vascular disease with multiple revascularization procedures in the past as well as CVA maintained on Aggrenox prior to admission.  Admitted on January 06, 2013, with a new blister tip of his left great toe with increased rest pain.  CT angioplasty of lower extremity showed critical stenosis, left common femoral artery just distal to the femoral bypass graft. Underwent femoral to below knee popliteal artery bypass with patch angioplasty on January 06, 2013, per Dr. Darrick Penna.  Intraoperative single static arteriogram image showed patent bypass graft with 3 vessel runoff.  Acute blood loss anemia 7.5, transfused and monitored. Followup ABI studies indicated a moderate reduction in arterial flow on the right and a mild reduction on the left.  CT angio aorta and bifemoral performed to assess left subclavian axillary artery as well as left femoral popliteal runoff.  There was findings of occluded graft site and underwent redo femoropopliteal bypass on January 10, 2013, per Dr. Hart Rochester.  He was placed on subcutaneous Lovenox for DVT prophylaxis on January 11, 2013.  The patient was admitted  for comprehensive rehab program.  PAST MEDICAL HISTORY:  See discharge diagnoses.  SOCIAL HISTORY:  Lives with spouse.  FUNCTIONAL HISTORY:  Prior to admission independent with cane. Functional status upon admission to rehab services +2 total assist to ambulate 50 feet with a rolling walker.  PHYSICAL EXAMINATION:  VITAL SIGNS:  Blood pressure 147/79, pulse 83, temperature 98.9, respirations 18. GENERAL:  This was an alert male, in no acute distress.  Pupils round and reactive to light. LUNGS:  Clear to auscultation. CARDIAC:  Regular rate and rhythm. ABDOMEN:  Soft, nontender.  Good bowel sounds, bypass graft site had some surrounding edema.  No odor.  He had a dry dressing to the left first toe.  REHABILITATION HOSPITAL COURSE:  The patient was admitted to inpatient rehab services with therapies initiated on a 3-hour daily basis consisting of physical therapy, occupational therapy, and rehabilitation nursing.  The following issues were addressed during the patient's rehabilitation stay.  Pertaining to [name]'s severe peripheral vascular disease.  He had undergone femoral popliteal bypass graft and redo on January 10, 2013, secondary to occluded graft.  He continued to be followed by Vascular Surgery.  There was some noted increased drainage from the site, left groin drainage, 2 nylon sutures were removed by Vascular Surgery.  Wound was packed.  He was placed on vancomycin and Zosyn.  He remained  on subcutaneous Lovenox for DVT prophylaxis, pain management, use of Neurontin 3 times daily, and Percocet as needed.  Blood pressures remained well controlled.  He did have a history of diabetes mellitus, peripheral neuropathy.  Hemoglobin A1c of 7.4.  Noted history of tobacco abuse.  He continued on Zyban. The patient received weekly collaborative interdisciplinary team conferences to discuss estimated length of stay, family teaching, and any barriers to discharge.  He  continued to participate fully with therapies, functionally doing quite well, and ambulating with an assistive device.  However, due to increased drainage from his site and full therapies being completed plan was to be discharged to Acute Care Services of vascular surgery for ongoing monitoring of wound while maintained on antibiotic therapy.  There was a plan for VAC.  All issues in reference to the patient's medical course were discussed with the patient.  He would be discharged to Acute Care Services for ongoing monitoring.  All medication changes would be made as per vascular surgery.     Mariam Dollar, P.A.   ______________________________ Ranelle Oyster, M.D.    DA/MEDQ  D:  01/21/2013  T:  01/21/2013  Job:  [id number]  cc:   Janetta Hora. Darrick Penna, MD

## 2013-01-21 NOTE — H&P (Signed)
[facility] ADMIT NOTE  MRN : [id number]  WU:[id number] left groin wound  History of Present Illness: HPI: Donald [name] is a 67 y.o. male who underwent axillary bifemoral bypass 01/29/2012. His wife report a new blister on the tip of his left great toe due to new shoe ware that is healing fine and a superficial scrape on the lateral malleolus. He returns today for followup after recent CT angiogram with runoff. He thinks that the toes have gotten worse. He still has pain in the left foot. He is fairly debilitated overall from prior stroke. He is able to transfer but is not able to ambulate much due to left-sided weakness.  He now has a limb threatening situation with ulcerations and blistering of his left first and second toe with decreased ABIs. He has evidence of common femoral artery occlusive disease below the anastomosis. He also has a proximal SFA occlusion. He also has popliteal artery occlusive disease. Apparently there is three-vessel runoff to the right foot. I believe the best option would be a left femoral to below-knee popliteal bypass with vein if this is available conduit. If not we will use prosthetic. We'll assess the adequacy of his inflow on the table. If the inflow was found to be poor then we would possibly also revise the proximal anastomosis as well. Most likely I will only address the outflow procedure in this setting. We would consider an arteriogram to evaluate the inflow later on if it is not adequate.   Donald [name] is a 67 y.o. male is S/P  Left femoral to below knee popliteal artery bypass with non reversed greater saphenous vein, patch angioplasty left common femoral artery, intraop agram  Pt had a CTA of arch vessels on 01/09/13 and found to have irregular nonocclusive  thrombus along the posterior wall of the common femoral artery in  the region of the patchy angioplasty and the saphenous vein bypass  graft is  completely occluded approximately 1 cm beyond the  Anastomosis.  Pt was taken to OR on 01/10/13 for  Procedure(s) #1 attempted thrombectomy left femoral-popliteal saphenous vein graft-unsuccessful  #2 insertion of new left common femoral to popliteal (below knee) bypass using a 6 mm Gore-Tex-propaten graft #3 intraoperative arteriogram left leg  Post-operatively pt had DP/PT flow to foot  Pt began to have drainage from left groin wound on 01/20/2013 - culture shows mult organisms. Wound opened- large amt of serous fluid expressed-  Pt started on Vanc and Zosyn. Will readmit to floor from rehab for wound care and IV antibiotics    Current Facility-Administered Medications  Medication Dose Route Frequency Provider Last Rate Last Dose  . acetaminophen (TYLENOL) tablet 325-650 mg  325-650 mg Oral Q4H PRN Mcarthur Rossetti Angiulli, PA-C   650 mg at 01/13/13 1708  . acidophilus (RISAQUAD) capsule 1 capsule  1 capsule Oral Daily Mcarthur Rossetti Angiulli, PA-C   1 capsule at 01/21/13 0815  . bisacodyl (DULCOLAX) suppository 10 mg  10 mg Rectal Daily PRN Mcarthur Rossetti Angiulli, PA-C      . buPROPion (ZYBAN) 12 hr tablet 150 mg  150 mg Oral Daily Mcarthur Rossetti Angiulli, PA-C   150 mg at 01/21/13 0815  . dipyridamole-aspirin (AGGRENOX) 200-25 MG per 12 hr capsule 1 capsule  1 capsule Oral BID Mcarthur Rossetti Angiulli, PA-C   1 capsule at 01/21/13 0815  . enoxaparin (LOVENOX) injection 40 mg  40  mg Subcutaneous Q24H Jacquelynn Cree, PA-C   40 mg at 01/21/13 0816  . fluconazole (DIFLUCAN) tablet 100 mg  100 mg Oral Daily Ranelle Oyster, MD   100 mg at 01/21/13 0815  . gabapentin (NEURONTIN) capsule 300 mg  300 mg Oral TID Mcarthur Rossetti Angiulli, PA-C   300 mg at 01/21/13 0815  . guaiFENesin-dextromethorphan (ROBITUSSIN DM) 100-10 MG/5ML syrup 15 mL  15 mL Oral Q4H PRN Mcarthur Rossetti Angiulli, PA-C   15 mL at 01/15/13 0903  . insulin aspart (novoLOG) injection 0-15 Units  0-15 Units Subcutaneous TID WC Mcarthur Rossetti Angiulli, PA-C   3 Units at 01/21/13 [phone]   . lisinopril (PRINIVIL,ZESTRIL) tablet 2.5 mg  2.5 mg Oral Daily Mcarthur Rossetti Angiulli, PA-C   2.5 mg at 01/21/13 0815  . metFORMIN (GLUCOPHAGE-XR) 24 hr tablet 500 mg  500 mg Oral BID WC Mcarthur Rossetti Angiulli, PA-C   500 mg at 01/21/13 0815  . omega-3 acid ethyl esters (LOVAZA) capsule 2 g  2 g Oral BID Mcarthur Rossetti Angiulli, PA-C   2 g at 01/21/13 0815  . ondansetron (ZOFRAN) tablet 4 mg  4 mg Oral Q6H PRN Mcarthur Rossetti Angiulli, PA-C   4 mg at 01/15/13 1509   Or  . ondansetron (ZOFRAN) injection 4 mg  4 mg Intravenous Q6H PRN Mcarthur Rossetti Angiulli, PA-C      . oxyCODONE-acetaminophen (PERCOCET/ROXICET) 5-325 MG per tablet 1-2 tablet  1-2 tablet Oral Q4H PRN Charlton Amor, PA-C   2 tablet at 01/21/13 [phone]  . pantoprazole (PROTONIX) EC tablet 40 mg  40 mg Oral Daily Mcarthur Rossetti Angiulli, PA-C   40 mg at 01/21/13 9147  . piperacillin-tazobactam (ZOSYN) IVPB 3.375 g  3.375 g Intravenous Q8H Ranelle Oyster, MD   3.375 g at 01/21/13 0705  . senna-docusate (Senokot-S) tablet 2 tablet  2 tablet Oral QHS Mcarthur Rossetti Angiulli, PA-C   2 tablet at 01/19/13 2223  . sodium chloride 0.9 % injection 10-40 mL  10-40 mL Intracatheter Q12H Ranelle Oyster, MD   10 mL at 01/17/13 2000  . sodium chloride 0.9 % injection 10-40 mL  10-40 mL Intracatheter PRN Ranelle Oyster, MD   30 mL at 01/19/13 2000  . sorbitol 70 % solution 30 mL  30 mL Oral Daily PRN Mcarthur Rossetti Angiulli, PA-C      . vancomycin (VANCOCIN) IVPB 1000 mg/200 mL premix  1,000 mg Intravenous Q12H Ranelle Oyster, MD   1,000 mg at 01/21/13 8295     Past Medical History  Diagnosis Date  . GERD (gastroesophageal reflux disease)   . Hyperlipidemia   . Hypertension   . Peripheral vascular disease   . Type II diabetes mellitus   . Pneumonia   . Internal and external bleeding hemorrhoids   . Stroke 2008    "left side weak; unable to move left hand still" (01/23/2012)  . History of gout     left great toe  . anal ca dx'd 08/2007    S/P chemo, radiation, biopsies  .  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 Depression   . Numbness in right leg     Hx: of diabetes  . Coronary artery disease     RCA occlusion with good collaterals, o/w no obs CAD 12/2011    Past Surgical History  Procedure Laterality Date  . Carotid endarterectomy  2008    right  . Liver cryoablation  08/2010    "chemo shrunk the cancer  50% then they went in and burned the rest of it"  . Mass biopsy      rectal; "found cancer"  . Axillary-femoral bypass graft  01/29/2012    Procedure: BYPASS GRAFT AXILLA-BIFEMORAL;  Surgeon: Sherren Kerns, MD;  Location: [facility] OR;  Service: Vascular;  Laterality: N/A;  Left axilla-bifemoral bypass using gore-tex graft.   . Colonoscopy w/ biopsies and polypectomy      Hx: of  . Femoral-popliteal bypass graft Left 01/06/2013    Procedure: Femoral-Popliteal Artery Bypass;  Surgeon: Sherren Kerns, MD;  Location: [facility] OR;  Service: Vascular;  Laterality: Left;  . Intraoperative arteriogram Left 01/06/2013    Procedure: INTRA OPERATIVE ARTERIOGRAM;  Surgeon: Sherren Kerns, MD;  Location: [facility] OR;  Service: Vascular;  Laterality: Left;  . Femoral-popliteal bypass graft Left 01/10/2013    Procedure: THROMBECTOMY POSSIBLE BYPASS GRAFT FEMORAL-POPLITEAL ARTERY;  Surgeon: Pryor Ochoa, MD;  Location: [facility] OR;  Service: Vascular;  Laterality: Left;  Attempted Thrombectomy of left Femoral/Popliteal graft - Unsuccessful.  Insertion of Left femoral to below the knee popliteal propaten graft.    Social History History  Substance Use Topics  . Smoking status: Current Every Day Smoker -- 0.10 packs/day for 50 years    Types: Cigarettes  . Smokeless tobacco: Never Used     Comment: pt stated he only smokes 3-4 cigs per day and is woking on quitting completely   . Alcohol Use: No    Family History Family History  Problem Relation Age of Onset  . Cancer - Other Sister     Allergies  Allergen Reactions  . Other     Became hypotensive. Pt states allergic to "muscle  relaxer" but unsure of name.  . Tape Hives and Itching    medfix non woven tape     REVIEW OF SYSTEMS  General: [ ]  Weight loss, [ ]  Fever, [ ]  chills Neurologic: [ ]  Dizziness, [ ]  Blackouts, [ ]  Seizure [ x] Stroke, [ ]  "Mini stroke", [ ]  Slurred speech, [ ]  Temporary blindness; [x ] weakness in arms or legs, [ ]  Hoarseness [ ]  Dysphagia Cardiac: [ ]  Chest pain/pressure, [ ]  Shortness of breath at rest [ ]  Shortness of breath with exertion, [ ]  Atrial fibrillation or irregular heartbeat  Vascular: [ x] Pain in legs with walking, [ ]  Pain in legs at rest, [ ]  Pain in legs at night,  [ ]  Non-healing ulcer, [ ]  Blood clot in vein/DVT,   Pulmonary: [ ]  Home oxygen, [ ]  Productive cough, [ ]  Coughing up blood, [ ]  Asthma,  [ ]  Wheezing [ ]  COPD Musculoskeletal:  [x ] Arthritis, [ ]  Low back pain, [ ]  Joint pain Hematologic: [ ]  Easy Bruising, [ ]  Anemia; [ ]  Hepatitis Gastrointestinal: [ ]  Blood in stool, [ ]  Gastroesophageal Reflux/heartburn, Urinary: [ ]  chronic Kidney disease, [ ]  on HD - [ ]  MWF or [ ]  TTHS, [ ]  Burning with urination, [ ]  Difficulty urinating Skin: [ ]  Rashes, [ ]  Wounds Psychological: [ ]  Anxiety, [ ]  Depression  Physical Examination Filed Vitals:   01/19/13 1300 01/20/13 0653 01/20/13 1505 01/21/13 0619  BP: 98/65 128/64 138/49 136/73  Pulse: 86 77 91 73  Temp: 97.6 F (36.4 C) 98 F (36.7 C) 98.4 F (36.9 C) 98 F (36.7 C)  TempSrc: Oral Oral Oral Oral  Resp: 18 18 18 19   Height:      Weight:      SpO2: 98%  96% 93% 95%   Body mass index is 27.07 kg/(m^2).  General:  WDWN in NAD Gait: improving with rehab - some residual limp HENT: WNL Eyes: Pupils equal Pulmonary: normal non-labored breathing  Cardiac: RRR, Abdomen: soft, NT, no masses Skin: no rashes, ulcers noted; Left groin wound opened and packed Neurologic: A&O X 3; Appropriate Affect ;  SENSATION: normal; MOTOR FUNCTION: 5/5 Symmetric Speech is hoarse  Significant Diagnostic  Studies: CBC Lab Results  Component Value Date   WBC 9.5 01/20/2013   HGB 8.3* 01/20/2013   HCT 26.7* 01/20/2013   MCV 87.3 01/20/2013   PLT 447* 01/20/2013   Microbiology- Culture -  NO WBC SEEN NO SQUAMOUS EPITHELIAL CELLS SEEN ABUNDANT GRAM POSITIVE COCCI IN PAIRS ABUNDANT GRAM NEGATIVE RODS Performed at [employer] [facility]  Culture  MULTIPLE ORGANISMS PRESENT, NONE PREDOMINANT NO STAPHYLOCOCCUS AUREUS ISOLATED NO GROUP A STREP (S.PYOGENES) ISOLATED     ASSESSMENT/PLAN: left groin wound drainage and opened and packed  Transfer to floor for IV antibiotics and wound care. Continue PT    Vincenzina Jagoda J 01/21/2013 8:53 AM

## 2013-01-21 NOTE — Progress Notes (Addendum)
Social Work Patient ID: Donald [name], male   DOB: [birth year], 67 y.o.   MRN: [id number]  Met yesterday afternoon with pt and wife with both aware and agreeable with targeted d/c date today.  Family education completed.  Was awaiting consultation from CVTS about incision.  Per PA this morning, new issues now with incision site.  VAC being considered. Considering if need to transfer back to acute.  Will keep team informed as I know.  Aubry Rankin, LCSW

## 2013-01-21 NOTE — Progress Notes (Signed)
ANTIBIOTIC CONSULT NOTE - INITIAL  Pharmacy Consult for Vancomycin and Zosyn Indication: Wound infection s/p Vascular procedure  Allergies  Allergen Reactions  . Other     Became hypotensive. Pt states allergic to "muscle relaxer" but unsure of name.  . Tape Hives and Itching    medfix non woven tape    Patient Measurements:  Weight: 80.7kg Adjusted Body Weight:   Vital Signs: Temp: 98.3 F (36.8 C) (09/24 2046) Temp src: Oral (09/24 2046) BP: 95/59 mmHg (09/24 2046) Pulse Rate: 78 (09/24 2046) Intake/Output from previous day:   Intake/Output from this shift:    Labs:  Recent Labs  01/20/13 0514  WBC 9.5  HGB 8.3*  PLT 447*  CREATININE 1.12   The CrCl is unknown because both a height and weight (above a minimum accepted value) are required for this calculation. No results found for this basename: VANCOTROUGH, Leodis Binet, VANCORANDOM, GENTTROUGH, GENTPEAK, GENTRANDOM, TOBRATROUGH, TOBRAPEAK, TOBRARND, AMIKACINPEAK, AMIKACINTROU, AMIKACIN,  in the last 72 hours   Microbiology: Recent Results (from the past 720 hour(s))  SURGICAL PCR SCREEN     Status: Abnormal   Collection Time    01/02/13 12:08 PM      Result Value Range Status   MRSA, PCR NEGATIVE  NEGATIVE Final   Staphylococcus aureus POSITIVE (*) NEGATIVE Final   Comment:            The Xpert SA Assay (FDA     approved for NASAL specimens     in patients over 25 years of age),     is one component of     a comprehensive surveillance     program.  Test performance has     been validated by [employer] for patients greater     than or equal to 38 year old.     It is not intended     to diagnose infection nor to     guide or monitor treatment.  WOUND CULTURE     Status: None   Collection Time    01/10/13  8:39 AM      Result Value Range Status   Specimen Description WOUND LEFT GROIN   Final   Special Requests NONE   Final   Gram Stain     Final   Value: MODERATE WBC PRESENT, PREDOMINANTLY PMN      NO SQUAMOUS EPITHELIAL CELLS SEEN     NO ORGANISMS SEEN     Performed at [facility]   Culture     Final   Value: NO GROWTH 2 DAYS     Performed at [facility]   Report Status 01/12/2013 FINAL   Final  MRSA PCR SCREENING     Status: None   Collection Time    01/10/13  3:53 PM      Result Value Range Status   MRSA by PCR NEGATIVE  NEGATIVE Final   Comment:            The GeneXpert MRSA Assay (FDA     approved for NASAL specimens     only), is one component of a     comprehensive MRSA colonization     surveillance program. It is not     intended to diagnose MRSA     infection nor to guide or     monitor treatment for     MRSA infections.  WOUND CULTURE     Status: None   Collection Time  01/19/13 11:30 PM      Result Value Range Status   Specimen Description WOUND LEFT GROIN   Final   Special Requests NONE   Final   Gram Stain     Final   Value: NO WBC SEEN     NO SQUAMOUS EPITHELIAL CELLS SEEN     ABUNDANT GRAM POSITIVE COCCI IN PAIRS     ABUNDANT GRAM NEGATIVE RODS     Performed at [facility]   Culture     Final   Value: MULTIPLE ORGANISMS PRESENT, NONE PREDOMINANT NO STAPHYLOCOCCUS AUREUS ISOLATED NO GROUP A STREP (S.PYOGENES) ISOLATED     Performed at [facility]   Report Status 01/21/2013 FINAL   Final    Medical History: Past Medical History  Diagnosis Date  . GERD (gastroesophageal reflux disease)   . Hyperlipidemia   . Hypertension   . Peripheral vascular disease   . Type II diabetes mellitus   . Pneumonia   . Internal and external bleeding hemorrhoids   . Stroke 2008    "left side weak; unable to move left hand still" (01/23/2012)  . History of gout     left great toe  . anal ca dx'd 08/2007    S/P chemo, radiation, biopsies  . 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 Depression   . Numbness in right leg     Hx: of diabetes  . Coronary artery disease     RCA occlusion with good collaterals, o/w no obs CAD 12/2011     Medications:  Prescriptions prior to admission  Medication Sig Dispense Refill  . acidophilus (RISAQUAD) CAPS Take 1 capsule by mouth daily.       . Ascorbic Acid (VITAMIN C) 1000 MG tablet Take 1,000 mg by mouth daily.      Marland Kitchen buPROPion (ZYBAN) 150 MG 12 hr tablet Take 150 mg by mouth daily.       Marland Kitchen dipyridamole-aspirin (AGGRENOX) 200-25 MG per 12 hr capsule Take 1 capsule by mouth 2 (two) times daily.  180 capsule  1  . docusate sodium (COLACE) 100 MG capsule Take 200 mg by mouth 2 (two) times daily.      Marland Kitchen gabapentin (NEURONTIN) 300 MG capsule Take 300 mg by mouth 3 (three) times daily.      Marland Kitchen lisinopril (PRINIVIL,ZESTRIL) 5 MG tablet Take 0.5 tablets (2.5 mg total) by mouth daily.      . metFORMIN (GLUMETZA) 500 MG (MOD) 24 hr tablet Take 500 mg by mouth 2 (two) times daily with a meal.       . mupirocin ointment (BACTROBAN) 2 % 1 Application, nasally, 2 times daily for 5 days prior to surgery  22 g  0  . omega-3 acid ethyl esters (LOVAZA) 1 G capsule Take 2 g by mouth 2 (two) times daily.       Marland Kitchen omeprazole (PRILOSEC) 40 MG capsule Take 40 mg by mouth daily.      . Probiotic Product (ALIGN PO) Take 1 tablet by mouth at bedtime.       Assessment: 67yom transferred from Rehab to continue Vancomycin and Zosyn for suspected wound infection s/p vascular procedure (9/13). Per notes, patient began to have drainage from wound on 9/23 now with large amount of serous fluid when wound opened. Antibiotics had been started on 9/23 at Rehab - will continue regimens. - Wt: 80.7kg - CrCl 60-65 ml/min  Goal of Therapy:  Vancomycin trough level 10-15 mcg/ml  Plan:  1.  Continue Vancomycin 1g IV q12h - next dose tomorrow AM ~0600 2. Continue Zosyn 3.375g IV q8h - infuse over 4 hours 3. Monitor renal function, cultures, clinical course and order Vancomycin trough in 1-2 days if continued  Cleon Dew [phone] 01/21/2013,10:17 PM

## 2013-01-21 NOTE — Discharge Summary (Signed)
Discharge summary job # [id number]

## 2013-01-21 NOTE — Progress Notes (Signed)
Realized at 2120 pt did not have active orders. Notified MD on call, Dr. Hart Rochester. Stated that he was unable to access to put in orders and to continue carb mod diet, continue to check cbg ACHS, continue ABT, check vitals per routine, and dry dressing as needed until MD rounds in the am. Dressing changed to lower leg due to drainage and new outer dressing changed to groin (packing not changed).

## 2013-01-21 NOTE — Patient Care Conference (Signed)
Inpatient RehabilitationTeam Conference and Plan of Care Update Date: 01/20/2013   Time: 2:25 PM    Patient Name: Donald [name]      Medical Record Number: [id number]  Date of Birth: Jan 06, 1946 Sex: Male         Room/Bed: 4W01C/4W01C-01 Payor Info: Payor: MEDICARE / Plan: MEDICARE PART A AND B / Product Type: *No Product type* /    Admitting Diagnosis: deconditioned l fpbpa  Admit Date/Time:  01/13/2013  4:12 PM Admission Comments: No comment available   Primary Diagnosis:  PVD (peripheral vascular disease) with claudication Principal Problem: PVD (peripheral vascular disease) with claudication  Patient Active Problem List   Diagnosis Date Noted  . PVD (peripheral vascular disease) with claudication 01/14/2013  . Hemiparesis affecting left side as late effect of stroke 01/05/2013  . Abnormality of gait 09/03/2012  . Other acquired deformity of ankle and foot(736.79) 09/03/2012  . Aftercare following surgery of the circulatory system, NEC 05/15/2012  . 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 Peripheral vascular disease, unspecified 03/06/2012  . PVD (peripheral vascular disease) 02/21/2012  . Occlusion and stenosis of carotid artery without mention of cerebral infarction 02/21/2012  . Physical deconditioning 01/31/2012  . Atherosclerotic peripheral vascular disease with rest pain 01/24/2012  . Preoperative evaluation to rule out surgical contraindication 01/24/2012  . Anal carcinoma 10/30/2011  . Anal cancer 02/26/2011  . SEC MALIGNANT NEOPLASM OF LARGE INTESTINE&RECTUM 11/09/2008  . NAUSEA AND VOMITING 11/09/2008  . RECTAL MASS 08/30/2008  . CONSTIPATION 08/18/2008  . RECTAL BLEEDING 08/18/2008  . FLATULENCE-GAS-BLOATING 08/18/2008  . ABDOMINAL PAIN -GENERALIZED 08/18/2008  . DM 08/17/2008  . HYPERLIPIDEMIA 08/17/2008  . HEMORRHOIDS 08/17/2008  . CONSTIPATION, CHRONIC 08/17/2008  . CEREBROVASCULAR ACCIDENT, HX OF  08/17/2008    Expected Discharge Date: Expected Discharge Date: 01/21/13  Team Members Present: Physician leading conference: Dr. Faith Rogue Social Worker Present: Amada Jupiter, LCSW Nurse Present: Kennon Portela, RN PT Present: Sherrine Maples, Grayland Ormond, PT OT Present: Edwin Cap, Loistine Chance, OT PPS Coordinator present : Tora Duck, RN, CRRN     Current Status/Progress Goal Weekly Team Focus  Medical   s/p LLE BPG. pain, old cva  stabilize medically for dc  wound care, post-op mgt   Bowel/Bladder   pt continent of bowel and bladder         Swallow/Nutrition/ Hydration             ADL's   supervision/min A overall; supevision transfers  min A/supervision overall  family educaiton; activity tolerance   Mobility   supervision  supervision  family education, preparation for D/C   Communication             Safety/Cognition/ Behavioral Observations            Pain   Pt complains of aching/burning pain to left grion. 2Percocet given PRN Q4 hrs.  3 or less  Assess pain and medicate as needed; monitor   Skin   pt has staples to left leg to groin.  Area around staples reddned, swollen and tender to touch. Left groin has sutures inplace with serous driange and mild yellow drianage present. Guaze changed as ordered; yeast to groin-area cleaned and mgp applied; left toe has black area present, guaze inplace; stage II to sacrum  with foam applied; right chest portacath   prevent further breakdown while on rehab; say free infection to incision sites  monitor s/s of infection; assess skin qshift; change dressing as ordered  and PRN    Rehab Goals Patient on target to meet rehab goals: Yes *See Care Plan and progress notes for long and short-term goals.  Barriers to Discharge: none   Possible Resolutions to Barriers:  none    Discharge Planning/Teaching Needs:  home with wife who can provide 24/7 assistance      Team Discussion:  Therapies feel pt is ready for d/c  home and family education has been completed.  Wound issues continue.  Changed to po abx. Await word from CVTS about incision.  Hope to be able to d/c tomorrow.  Revisions to Treatment Plan:  None   Continued Need for Acute Rehabilitation Level of Care: The patient requires daily medical management by a physician with specialized training in physical medicine and rehabilitation for the following conditions: Daily direction of a multidisciplinary physical rehabilitation program to ensure safe treatment while eliciting the highest outcome that is of practical value to the patient.: Yes Daily medical management of patient stability for increased activity during participation in an intensive rehabilitation regime.: Yes Daily analysis of laboratory values and/or radiology reports with any subsequent need for medication adjustment of medical intervention for : Other;Neurological problems;Post surgical problems  Larae Caison 01/21/2013, 8:24 AM

## 2013-01-22 ENCOUNTER — Encounter (HOSPITAL_COMMUNITY): Payer: Self-pay | Admitting: General Practice

## 2013-01-22 LAB — GLUCOSE, CAPILLARY
Glucose-Capillary: 130 mg/dL — ABNORMAL HIGH (ref 70–99)
Glucose-Capillary: 229 mg/dL — ABNORMAL HIGH (ref 70–99)

## 2013-01-22 MED ORDER — GABAPENTIN 300 MG PO CAPS
300.0000 mg | ORAL_CAPSULE | Freq: Three times a day (TID) | ORAL | Status: DC
  Administered 2013-01-22 – 2013-02-06 (×47): 300 mg via ORAL
  Filled 2013-01-22 (×50): qty 1

## 2013-01-22 MED ORDER — MUPIROCIN 2 % EX OINT
TOPICAL_OINTMENT | Freq: Every day | CUTANEOUS | Status: DC
  Administered 2013-01-24 – 2013-02-04 (×3): via TOPICAL
  Filled 2013-01-22: qty 22

## 2013-01-22 MED ORDER — ASPIRIN-DIPYRIDAMOLE ER 25-200 MG PO CP12
1.0000 | ORAL_CAPSULE | Freq: Two times a day (BID) | ORAL | Status: DC
  Administered 2013-01-22 – 2013-02-06 (×31): 1 via ORAL
  Filled 2013-01-22 (×33): qty 1

## 2013-01-22 MED ORDER — LISINOPRIL 2.5 MG PO TABS
2.5000 mg | ORAL_TABLET | Freq: Every day | ORAL | Status: DC
  Administered 2013-01-22 – 2013-02-06 (×16): 2.5 mg via ORAL
  Filled 2013-01-22 (×16): qty 1

## 2013-01-22 MED ORDER — ACETAMINOPHEN 650 MG RE SUPP: 325.0000 mg | RECTAL | Status: DC | PRN

## 2013-01-22 MED ORDER — OMEGA-3-ACID ETHYL ESTERS 1 G PO CAPS
2.0000 g | ORAL_CAPSULE | Freq: Two times a day (BID) | ORAL | Status: DC
  Administered 2013-01-22 – 2013-02-06 (×31): 2 g via ORAL
  Filled 2013-01-22 (×33): qty 2

## 2013-01-22 MED ORDER — DOCUSATE SODIUM 100 MG PO CAPS: 100.0000 mg | ORAL_CAPSULE | Freq: Two times a day (BID) | ORAL | Status: DC

## 2013-01-22 MED ORDER — ACETAMINOPHEN 325 MG PO TABS
325.0000 mg | ORAL_TABLET | ORAL | Status: DC | PRN
  Administered 2013-02-04: 650 mg via ORAL
  Filled 2013-01-22: qty 2

## 2013-01-22 MED ORDER — PHENOL 1.4 % MT LIQD
1.0000 | OROMUCOSAL | Status: DC | PRN
  Filled 2013-01-22: qty 177

## 2013-01-22 MED ORDER — OXYCODONE-ACETAMINOPHEN 5-325 MG PO TABS
1.0000 | ORAL_TABLET | ORAL | Status: DC | PRN
  Administered 2013-01-22 – 2013-01-28 (×18): 2 via ORAL
  Administered 2013-01-29: 1 via ORAL
  Administered 2013-01-29 – 2013-01-30 (×3): 2 via ORAL
  Administered 2013-01-30: 1 via ORAL
  Filled 2013-01-22 (×23): qty 2

## 2013-01-22 MED ORDER — VITAMIN C 500 MG PO TABS
1000.0000 mg | ORAL_TABLET | Freq: Every day | ORAL | Status: DC
  Administered 2013-01-22 – 2013-02-05 (×11): 1000 mg via ORAL
  Filled 2013-01-22 (×16): qty 2

## 2013-01-22 MED ORDER — INFLUENZA VAC SPLIT QUAD 0.5 ML IM SUSP
0.5000 mL | INTRAMUSCULAR | Status: AC
  Administered 2013-01-24: 0.5 mL via INTRAMUSCULAR
  Filled 2013-01-22: qty 0.5

## 2013-01-22 MED ORDER — METOPROLOL TARTRATE 1 MG/ML IV SOLN: 2.0000 mg | INTRAVENOUS | Status: DC | PRN

## 2013-01-22 MED ORDER — LABETALOL HCL 5 MG/ML IV SOLN
10.0000 mg | INTRAVENOUS | Status: DC | PRN
  Filled 2013-01-22: qty 4

## 2013-01-22 MED ORDER — INSULIN ASPART 100 UNIT/ML ~~LOC~~ SOLN
0.0000 [IU] | Freq: Three times a day (TID) | SUBCUTANEOUS | Status: DC
  Administered 2013-01-22: 5 [IU] via SUBCUTANEOUS
  Administered 2013-01-23: 2 [IU] via SUBCUTANEOUS
  Administered 2013-01-23: 3 [IU] via SUBCUTANEOUS
  Administered 2013-01-23 – 2013-01-24 (×2): 2 [IU] via SUBCUTANEOUS
  Administered 2013-01-24: 3 [IU] via SUBCUTANEOUS
  Administered 2013-01-24 – 2013-01-28 (×9): 2 [IU] via SUBCUTANEOUS
  Administered 2013-01-29: 3 [IU] via SUBCUTANEOUS
  Administered 2013-01-29 – 2013-01-30 (×4): 2 [IU] via SUBCUTANEOUS
  Administered 2013-01-31: 3 [IU] via SUBCUTANEOUS
  Administered 2013-01-31 – 2013-02-02 (×3): 2 [IU] via SUBCUTANEOUS
  Administered 2013-02-02: 3 [IU] via SUBCUTANEOUS
  Administered 2013-02-03: 2 [IU] via SUBCUTANEOUS
  Administered 2013-02-03: 3 [IU] via SUBCUTANEOUS
  Administered 2013-02-04 – 2013-02-05 (×4): 2 [IU] via SUBCUTANEOUS
  Administered 2013-02-05: 3 [IU] via SUBCUTANEOUS
  Administered 2013-02-06: 2 [IU] via SUBCUTANEOUS
  Administered 2013-02-06: 3 [IU] via SUBCUTANEOUS

## 2013-01-22 MED ORDER — PANTOPRAZOLE SODIUM 40 MG PO TBEC
40.0000 mg | DELAYED_RELEASE_TABLET | Freq: Every day | ORAL | Status: DC
  Administered 2013-01-22 – 2013-02-06 (×16): 40 mg via ORAL
  Filled 2013-01-22 (×16): qty 1

## 2013-01-22 MED ORDER — OXYCODONE HCL 5 MG PO TABS
5.0000 mg | ORAL_TABLET | ORAL | Status: DC | PRN
  Administered 2013-01-30 – 2013-02-06 (×26): 10 mg via ORAL
  Filled 2013-01-22 (×26): qty 2

## 2013-01-22 MED ORDER — PANTOPRAZOLE SODIUM 40 MG PO TBEC: 40.0000 mg | DELAYED_RELEASE_TABLET | Freq: Every day | ORAL | Status: DC

## 2013-01-22 MED ORDER — ALUM & MAG HYDROXIDE-SIMETH 200-200-20 MG/5ML PO SUSP
15.0000 mL | ORAL | Status: DC | PRN
  Administered 2013-01-31 – 2013-02-02 (×3): 30 mL via ORAL
  Filled 2013-01-22 (×4): qty 30

## 2013-01-22 MED ORDER — DOCUSATE SODIUM 100 MG PO CAPS
200.0000 mg | ORAL_CAPSULE | Freq: Two times a day (BID) | ORAL | Status: DC
  Administered 2013-01-22 – 2013-01-28 (×2): 200 mg via ORAL
  Filled 2013-01-22 (×17): qty 2

## 2013-01-22 MED ORDER — RISAQUAD PO CAPS
1.0000 | ORAL_CAPSULE | Freq: Every day | ORAL | Status: DC
  Administered 2013-01-22 – 2013-02-06 (×16): 1 via ORAL
  Filled 2013-01-22 (×16): qty 1

## 2013-01-22 MED ORDER — HYDRALAZINE HCL 20 MG/ML IJ SOLN: 10.0000 mg | INTRAMUSCULAR | Status: DC | PRN

## 2013-01-22 MED ORDER — BUPROPION HCL ER (SMOKING DET) 150 MG PO TB12
150.0000 mg | ORAL_TABLET | Freq: Every day | ORAL | Status: DC
  Administered 2013-01-22 – 2013-02-06 (×16): 150 mg via ORAL
  Filled 2013-01-22 (×17): qty 1

## 2013-01-22 MED ORDER — ONDANSETRON HCL 4 MG/2ML IJ SOLN
4.0000 mg | Freq: Four times a day (QID) | INTRAMUSCULAR | Status: DC | PRN
  Administered 2013-01-27 – 2013-02-01 (×4): 4 mg via INTRAVENOUS
  Filled 2013-01-22 (×4): qty 2

## 2013-01-22 MED ORDER — MORPHINE SULFATE 2 MG/ML IJ SOLN
2.0000 mg | INTRAMUSCULAR | Status: DC | PRN
  Administered 2013-01-22: 2 mg via INTRAVENOUS
  Filled 2013-01-22: qty 1

## 2013-01-22 MED ORDER — GUAIFENESIN-DM 100-10 MG/5ML PO SYRP: 15.0000 mL | ORAL_SOLUTION | ORAL | Status: DC | PRN

## 2013-01-22 MED ORDER — METFORMIN HCL ER 500 MG PO TB24
500.0000 mg | ORAL_TABLET | Freq: Two times a day (BID) | ORAL | Status: DC
  Administered 2013-01-22 – 2013-02-06 (×28): 500 mg via ORAL
  Filled 2013-01-22 (×32): qty 1

## 2013-01-22 NOTE — Progress Notes (Signed)
[facility]   Subjective  - some left groin soreness   Objective 125/34 79 98.5 F (36.9 C) (Oral) 16 98%  Intake/Output Summary (Last 24 hours) at 01/22/13 0900 Last data filed at 01/22/13 1610  Gross per 24 hour  Intake    360 ml  Output    600 ml  Net   -240 ml   Left groin unchanged clear serous drainage, clean overall no exposed graft  Assessment/Planning: Place VAC left groin Continue antibiotics PT/OT  Donald [name] E 01/22/2013 9:00 AM --  Laboratory Lab Results:  Recent Labs  01/20/13 0514  WBC 9.5  HGB 8.3*  HCT 26.7*  PLT 447*   BMET  Recent Labs  01/20/13 0514  CREATININE 1.12    COAG Lab Results  Component Value Date   INR 1.06 01/02/2013   INR 1.05 01/24/2012   INR 0.98 01/23/2012   No results found for this basename: PTT

## 2013-01-22 NOTE — Progress Notes (Signed)
UR Completed.  Cesar Alf Jane [phone] 01/22/2013  

## 2013-01-22 NOTE — Care Management Note (Unsigned)
    Page 1 of 2   01/27/2013     4:29:53 PM   CARE MANAGEMENT NOTE 01/27/2013  Patient:  Donald [name], Donald [name]   Account Number:  [id number]  Date Initiated:  01/22/2013  Documentation initiated by:  Gaila Engebretsen  Subjective/Objective Assessment:   PT ADM ON 01/21/13 FROM [facility] S/P FEM POP BYPASS GRAFT.  WOUND VAC PLACED TODAY.  PT HAS SUPPORTIVE SPOUSE, JUDY     Action/Plan:   WILL NEED HH FOLLOW UP AT DISCHARGE.  [facility] FOLLOWING FOR HH NEEDS.   Anticipated DC Date:  01/25/2013   Anticipated DC Plan:  HOME W HOME HEALTH SERVICES      DC Planning Services  CM consult      [facility] Choice  HOME HEALTH   Choice offered to / List presented to:  C-1 Patient   DME arranged  VAC      DME agency  [facility]     HH arranged  HH-1 RN  HH-2 PT  HH-3 OT      [facility] agency  [facility]   Status of service:  In process, will continue to follow Medicare Important Message given?   (If response is "NO", the following Medicare IM given date fields will be blank) Date Medicare IM given:   Date Additional Medicare IM given:    Discharge Disposition:    Per UR Regulation:  Reviewed for med. necessity/level of care/duration of stay  If discussed at Long Length of Stay Meetings, dates discussed:   01/27/2013    Comments:  01/27/13 Jeremih Dearmas,RN,BSN [phone] PT FOR LIKELY DC THIS WEEK; WILL ARRANGE TO HAVE HOME VAC DELIVERED EITHER THIS EVENING OR IN AM, FOR POSSIBLE DC SOON.  [facility] STANDING BY TO FOLLOW UP AT HOME FOR [facility] SERVICES.  01/23/13 Lyn Deemer,RN,BSN [phone] HOME VAC ORDER REFERRAL FAXED TO BRAD AT [facility], PER [employer]. AWAIT INSURANCE AUTH FOR HOME VAC.  WILL FOLLOW PROGRESS.  01/22/13 Dayanara Sherrill,RN,BSN [phone] WOUND VAC AUTHORIZATION FORM PLACED ON CHART IN [facility] FOR MD SIGNATURE.  MD/PA, PLEASE SIGN--WILL FAX TO [facility] FOR INSURANCE AUTH OF HOME WOUND VAC.

## 2013-01-22 NOTE — Consult Note (Addendum)
WOC consult Note Reason for Consult: placement of VAC to the left groin.  Wound type:surgical wound Measurement: 3cm x 1cm x 3cm  Wound bed: som yellow in the base and at the wound edges. Drainage (amount, consistency, odor) yellow-greenish drainage on the dressing/packing removed. Periwound: mild erythema  along the intact sutures Dressing procedure/placement/frequency: place 1pc of white foam in the wound bed, protected periwound with drape, then mushroom of black foam on top to accommodate the [facility] pad. Hydrocolloid placed in the distal groin fold to aid in seal of drape.   Noted after VAC placed that the lower right thigh wound was saturated, pt requested it to be changed. Once I had removed the dressing distal portion of this wound was draining quite a bit, when the qtip was gently rubbed over the area drainage increased, non purulent, wound is not significantly open.  I contacted Dr. Darrick Penna and he has requested this area be y connected to the groin site for drainage management.   WOC team will follow along with you for [facility] management. Candon Caras Eliberto Ivory RN,CWOCN [phone]  Addendum: protected surgical incision with mepitel,  Covered opening in incision with black foam, Y connected to VAC machine. Seal at obtained at each site.  Pt tolerated well.   Davina Poke RN,CWOCN

## 2013-01-22 NOTE — Evaluation (Signed)
Physical Therapy Evaluation Patient Details Name: Donald [name] MRN: [id number] DOB: [birth year] Today's Date: 01/22/2013 Time: 4098-1191 PT Time Calculation (min): 13 min  PT Assessment / Plan / Recommendation History of Present Illness  Left groin wound. Donald [name] is a 67 y.o. male who underwent axillary bifemoral bypass 01/29/2012. with fempop BPG and thrombectomy 01/10/13. 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 DC to CIR and returned to acute with open wound at left groin. VAC placed 9/25   Clinical Impression  Pt continues to have limited mobility and activity tolerance due to pain in LLE. Pt with below deficits will benefit from acute therapy to maximize gait with cane to return pt to PLOF and decrease burden of care. Pt eager to return home as today is his birthday.    PT Assessment  Patient needs continued PT services    Follow Up Recommendations  Home health PT;Supervision for mobility/OOB    Does the patient have the potential to tolerate intense rehabilitation      Barriers to Discharge        Equipment Recommendations  None recommended by PT    Recommendations for Other Services     Frequency Min 2X/week    Precautions / Restrictions Precautions Precautions: Fall Precaution Comments: residual weakness L side-old CVA   Pertinent Vitals/Pain 6/10 LLE with gait, subsides when static      Mobility  Bed Mobility Supine to Sit: 6: Modified independent (Device/Increase time);HOB flat Sit to Supine: 6: Modified independent (Device/Increase time);HOB flat Transfers Sit to Stand: From bed;5: Supervision Stand to Sit: To bed;5: Supervision Details for Transfer Assistance: cues for safety as pt slightly impulsive with standing before lines moved Ambulation/Gait Ambulation/Gait Assistance: 4: Min guard Ambulation Distance (Feet): 80 Feet Assistive device: Straight cane Ambulation/Gait Assistance Details: pt with limited distance due to pain in LLE and slight  unsteadiness with need for gait belt. Pt reports he will continue to use RW at DC unless therapy with him with belt. Gait Pattern: Step-to pattern;Decreased step length - left;Decreased weight shift to left;Left circumduction;Decreased dorsiflexion - left Gait velocity: decreased Stairs: No    Exercises     PT Diagnosis: Generalized weakness  PT Problem List: Decreased activity tolerance;Decreased balance;Decreased mobility;Decreased strength;Decreased knowledge of use of DME;Pain PT Treatment Interventions: Gait training;DME instruction;Functional mobility training;Therapeutic activities;Therapeutic exercise;Patient/family education     PT Goals(Current goals can be found in the care plan section) Acute Rehab PT Goals Patient Stated Goal: go home PT Goal Formulation: With patient/family Time For Goal Achievement: 01/29/13 Potential to Achieve Goals: Good  Visit Information  Last PT Received On: 01/22/13 Assistance Needed: +1 History of Present Illness: Left groin wound. Donald [name] is a 67 y.o. male who underwent axillary bifemoral bypass 01/29/2012. with fempop BPG and thrombectomy 01/10/13. 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 DC to CIR and returned to acute with open wound at left groin. VAC placed 9/25        Prior Functioning  Home Living Family/patient expects to be discharged to:: Private residence Living Arrangements: Spouse/significant other;Other relatives Available Help at Discharge: Family (wife and sister in law can provide help as well) Type of Home: House Home Access: Stairs to enter [employer] of Steps: 16 in front of house, 1 step in back entrance (which patient reports they use more often) Entrance Stairs-Rails: Right;Left Home Layout: Two level;Bed/bath upstairs Alternate Level Stairs-Number of Steps: flight; patient has chair lift Alternate Level Stairs-Rails: Right;Left Home Equipment: Walker - 2 wheels;Cane - single point;Bedside commode;Shower  seat;Hand held shower head;Grab bars - tub/shower Additional Comments: Pt has a stair chair lift now for going upstairs.   Lives With: Spouse Prior Function Level of Independence: Independent with assistive device(s) Comments: used cane PTA, has been using RW since recent admit for fempop BPG but wants to return to cane Communication Communication: No difficulties Dominant Hand: Right    Cognition  Cognition Arousal/Alertness: Awake/alert Behavior During Therapy: WFL for tasks assessed/performed Overall Cognitive Status: Within Functional Limits for tasks assessed    Extremity/Trunk Assessment Upper Extremity Assessment LUE Deficits / Details: residual hemiparesis from CVA. unable to use L hand Lower Extremity Assessment LLE Deficits / Details: grossly 3-/5  Cervical / Trunk Assessment Cervical / Trunk Assessment: Normal   Balance    End of Session PT - End of Session Equipment Utilized During Treatment: Gait belt Activity Tolerance: Patient limited by pain Patient left: in bed;with call bell/phone within reach;with nursing/sitter in room;with family/visitor present Nurse Communication: Mobility status  GP     Toney Sang Beth 01/22/2013, 2:03 PM Delaney Meigs, PT [phone]

## 2013-01-23 LAB — CBC
HCT: 27.5 % — ABNORMAL LOW (ref 39.0–52.0)
Hemoglobin: 8.6 g/dL — ABNORMAL LOW (ref 13.0–17.0)
MCH: 27.3 pg (ref 26.0–34.0)
MCHC: 31.3 g/dL (ref 30.0–36.0)
MCV: 87.3 fL (ref 78.0–100.0)
Platelets: 451 10*3/uL — ABNORMAL HIGH (ref 150–400)
RBC: 3.15 MIL/uL — ABNORMAL LOW (ref 4.22–5.81)
WBC: 5.9 10*3/uL (ref 4.0–10.5)

## 2013-01-23 LAB — GLUCOSE, CAPILLARY
Glucose-Capillary: 136 mg/dL — ABNORMAL HIGH (ref 70–99)
Glucose-Capillary: 132 mg/dL — ABNORMAL HIGH (ref 70–99)

## 2013-01-23 LAB — CLOSTRIDIUM DIFFICILE BY PCR: Toxigenic C. Difficile by PCR: NEGATIVE

## 2013-01-23 LAB — VANCOMYCIN, TROUGH: Vancomycin Tr: 20.4 ug/mL — ABNORMAL HIGH (ref 10.0–20.0)

## 2013-01-23 MED ORDER — VANCOMYCIN HCL IN DEXTROSE 750-5 MG/150ML-% IV SOLN
750.0000 mg | Freq: Two times a day (BID) | INTRAVENOUS | Status: DC
  Administered 2013-01-23 – 2013-02-02 (×20): 750 mg via INTRAVENOUS
  Filled 2013-01-23 (×21): qty 150

## 2013-01-23 MED ORDER — SODIUM CHLORIDE 0.9 % IJ SOLN
10.0000 mL | INTRAMUSCULAR | Status: DC | PRN
  Administered 2013-01-23: 20 mL
  Administered 2013-01-26 – 2013-02-06 (×6): 10 mL

## 2013-01-23 MED ORDER — BACID PO TABS
2.0000 | ORAL_TABLET | Freq: Three times a day (TID) | ORAL | Status: DC
  Administered 2013-01-23 – 2013-02-06 (×42): 2 via ORAL
  Filled 2013-01-23 (×44): qty 2

## 2013-01-23 NOTE — Progress Notes (Signed)
ANTIBIOTIC CONSULT NOTE - Follow-up  Pharmacy Consult for Vancomycin and Zosyn Indication: Wound infection s/p vascular procedure  Allergies  Allergen Reactions  . Other     Became hypotensive. Pt states allergic to "muscle relaxer" but unsure of name.  . Tape Hives and Itching    medfix non woven tape    Patient Measurements: Height: 5' 8.9" (175 cm) Weight: 175 lb 14.8 oz (79.8 kg) IBW/kg (Calculated) : 70.47  Vital Signs: Temp: 97.2 F (36.2 C) (09/26 0551) Temp src: Oral (09/26 0551) BP: 143/100 mmHg (09/26 0551) Pulse Rate: 83 (09/26 0551) Intake/Output from previous day: 09/25 0701 - 09/26 0700 In: 960 [P.O.:960] Out: 1000 [Urine:900; Drains:100] Intake/Output from this shift: Total I/O In: -  Out: 200 [Urine:200]  Labs:  Recent Labs  01/23/13 0500  WBC 5.9  HGB 8.6*  PLT 451*   Estimated Creatinine Clearance: 62.1 ml/min (by C-G formula based on Cr of 1.12).  Recent Labs  01/23/13 0600  VANCOTROUGH 20.4*     Microbiology: Recent Results (from the past 720 hour(s))  SURGICAL PCR SCREEN     Status: Abnormal   Collection Time    01/02/13 12:08 PM      Result Value Range Status   MRSA, PCR NEGATIVE  NEGATIVE Final   Staphylococcus aureus POSITIVE (*) NEGATIVE Final   Comment:            The Xpert SA Assay (FDA     approved for NASAL specimens     in patients over 7 years of age),     is one component of     a comprehensive surveillance     program.  Test performance has     been validated by [employer] for patients greater     than or equal to 53 year old.     It is not intended     to diagnose infection nor to     guide or monitor treatment.  WOUND CULTURE     Status: None   Collection Time    01/10/13  8:39 AM      Result Value Range Status   Specimen Description WOUND LEFT GROIN   Final   Special Requests NONE   Final   Gram Stain     Final   Value: MODERATE WBC PRESENT, PREDOMINANTLY PMN     NO SQUAMOUS EPITHELIAL CELLS SEEN      NO ORGANISMS SEEN     Performed at [facility]   Culture     Final   Value: NO GROWTH 2 DAYS     Performed at [facility]   Report Status 01/12/2013 FINAL   Final  MRSA PCR SCREENING     Status: None   Collection Time    01/10/13  3:53 PM      Result Value Range Status   MRSA by PCR NEGATIVE  NEGATIVE Final   Comment:            The GeneXpert MRSA Assay (FDA     approved for NASAL specimens     only), is one component of a     comprehensive MRSA colonization     surveillance program. It is not     intended to diagnose MRSA     infection nor to guide or     monitor treatment for     MRSA infections.  WOUND CULTURE     Status: None   Collection Time  01/19/13 11:30 PM      Result Value Range Status   Specimen Description WOUND LEFT GROIN   Final   Special Requests NONE   Final   Gram Stain     Final   Value: NO WBC SEEN     NO SQUAMOUS EPITHELIAL CELLS SEEN     ABUNDANT GRAM POSITIVE COCCI IN PAIRS     ABUNDANT GRAM NEGATIVE RODS     Performed at [facility]   Culture     Final   Value: MULTIPLE ORGANISMS PRESENT, NONE PREDOMINANT NO STAPHYLOCOCCUS AUREUS ISOLATED NO GROUP A STREP (S.PYOGENES) ISOLATED     Performed at [facility]   Report Status 01/21/2013 FINAL   Final   Assessment: 67yom on Vancomycin and Zosyn Day #4 for groin wound infection s/p vascular procedure. Afeb. WBC nl. Vancomycin trough 20.4 mcg/ml (supratherapeutic for goal of 10-15 mcg/ml). Last Scr done on 9/23 with est CrCl ~62 ml/min.  Vanc 9/23>>  Zosyn 9/23>>  Wound Cx (from Rehab)>> multiple organisms;none predominant; no SA; no strep  Goal of Therapy:  Vancomycin trough level 10-15 mcg/ml  Plan:  1. Change Vancomycin to 750mg  IV q12h. 2. Continue Zosyn 3.375g IV q8h - infuse over 4 hours 3. Monitor renal function, cultures and clinical course  Christoper Fabian, PharmD, BCPS Clinical pharmacist, pager [phone] 01/23/2013,12:56 PM

## 2013-01-23 NOTE — Progress Notes (Signed)
[facility] [place]  Subjective  - some groin soreness, having some diarrhea   Objective 143/100 83 97.2 F (36.2 C) (Oral) 18 94%  Intake/Output Summary (Last 24 hours) at 01/23/13 1107 Last data filed at 01/23/13 0900  Gross per 24 hour  Intake    600 ml  Output   1000 ml  Net   -400 ml   Left groin wound overall clean, some serous drainage, no exposed graft Left foot pink warm well perfused  Assessment/Planning: Poorly healing groin wound continue VAC Continue IV antibiotics for now Check c diff  Donald [name] E 01/23/2013 11:07 AM --  Laboratory Lab Results:  Recent Labs  01/23/13 0500  WBC 5.9  HGB 8.6*  HCT 27.5*  PLT 451*   BMET No results found for this basename: NA, K, CL, CO2, GLUCOSE, BUN, CREATININE, CALCIUM,  in the last 72 hours  COAG Lab Results  Component Value Date   INR 1.06 01/02/2013   INR 1.05 01/24/2012   INR 0.98 01/23/2012   No results found for this basename: PTT

## 2013-01-23 NOTE — Progress Notes (Signed)
Nutrition Brief Note  Patient identified on the Malnutrition Screening Tool (MST) Report  Wt Readings from Last 15 Encounters:  01/23/13 175 lb 14.8 oz (79.8 kg)  01/21/13 178 lb (80.74 kg)  01/06/13 199 lb 1.2 oz (90.3 kg)  01/06/13 199 lb 1.2 oz (90.3 kg)  01/06/13 199 lb 1.2 oz (90.3 kg)  01/05/13 183 lb (83.008 kg)  01/02/13 184 lb (83.462 kg)  01/01/13 184 lb 3.2 oz (83.553 kg)  12/18/12 186 lb 14.4 oz (84.777 kg)  12/03/12 187 lb 6.4 oz (85.004 kg)  10/06/12 187 lb 6.4 oz (85.004 kg)  09/03/12 187 lb (84.823 kg)  08/28/12 186 lb 14.4 oz (84.777 kg)  06/05/12 181 lb 12.8 oz (82.464 kg)  05/15/12 182 lb (82.555 kg)    Body mass index is 26.06 kg/(m^2). Patient meets criteria for overweight based on current BMI.   Current diet order is CHO Mod Medium, patient is consuming approximately 100% of meals at this time. Labs and medications reviewed.   Pt reports ~5 lbs wt loss due to recent hospitalization.  Pt currently eating well with overall stable wt at usual of 175 lbs. No nutrition interventions warranted at this time. If nutrition issues arise, please consult RD.   Loyce Dys, MS RD LDN Clinical Inpatient Dietitian Pager: [phone] Weekend/After hours pager: [phone]

## 2013-01-24 LAB — GLUCOSE, CAPILLARY
Glucose-Capillary: 143 mg/dL — ABNORMAL HIGH (ref 70–99)
Glucose-Capillary: 190 mg/dL — ABNORMAL HIGH (ref 70–99)
Glucose-Capillary: 125 mg/dL — ABNORMAL HIGH (ref 70–99)

## 2013-01-24 MED ORDER — LOPERAMIDE HCL 2 MG PO CAPS
2.0000 mg | ORAL_CAPSULE | ORAL | Status: DC | PRN
  Administered 2013-01-24 – 2013-02-06 (×19): 2 mg via ORAL
  Filled 2013-01-24 (×19): qty 1

## 2013-01-24 MED ORDER — ENOXAPARIN SODIUM 40 MG/0.4ML ~~LOC~~ SOLN
40.0000 mg | SUBCUTANEOUS | Status: DC
  Administered 2013-01-24 – 2013-02-06 (×14): 40 mg via SUBCUTANEOUS
  Filled 2013-01-24 (×15): qty 0.4

## 2013-01-24 NOTE — Progress Notes (Addendum)
  VASCULAR SURGERY BYPASS PROGRESS NOTE  01/10/2013 #1 attempted thrombectomy left femoral-popliteal saphenous vein graft-unsuccessful  #2 insertion of new left common femoral to popliteal (below knee) bypass using a 6 mm Gore-Tex-propaten graft #3 intraoperative arteriogram left leg  SUBJECTIVE: pt doing well except C/O continued diarrhea. C. Diff negative Ambulating. tol wound vac without diff  PHYSICAL EXAM: BP Readings from Last 3 Encounters:  01/24/13 115/55  01/21/13 113/70  01/13/13 141/67   Temp Readings from Last 3 Encounters:  01/24/13 98 F (36.7 C) Oral  01/21/13 97.5 F (36.4 C) Oral  01/13/13 97.6 F (36.4 C) Axillary   Pulse Readings from Last 3 Encounters:  01/24/13 75  01/21/13 78  01/13/13 93   SpO2 Readings from Last 3 Encounters:  01/24/13 95%  01/21/13 100%  01/13/13 97%     Intake/Output Summary (Last 24 hours) at 01/24/13 0838 Last data filed at 01/24/13 0700  Gross per 24 hour  Intake    360 ml  Output   1150 ml  Net   -790 ml    Extremities: BLE warm pink.  Wound vac in place left groin Serous drainage in cannister   LABS: Lab Results  Component Value Date   WBC 5.9 01/23/2013   HGB 8.6* 01/23/2013   HCT 27.5* 01/23/2013   MCV 87.3 01/23/2013   PLT 451* 01/23/2013   Lab Results  Component Value Date   CREATININE 1.12 01/20/2013   Lab Results  Component Value Date   INR 1.06 01/02/2013     ASSESSMENT: left groin open wound with no staph or strep growing Wound vac in place Diarrhea- may be sec to antibiotics - ? Start flagyl Added lactobacillus to med regimen yesterday C. Diff negative so will give imodium.  PLAN:  Ambulate Wound Management: wound vac to left groin  DVT prophylaxis: lovenox  Disposition:  Will likely go :Home early next week if wound looks good on Monday vac change.   Addendum  I have independently interviewed and examined the patient, and I agree with the physician assistant's findings.  L VAC adherent,   Ok with probiotic and imodium as CBC normal (5.9).  Leonides Sake, MD [facility] [place] Office: [phone] Pager: [phone]  01/24/2013, 9:30 AM

## 2013-01-24 NOTE — Progress Notes (Signed)
Pt resting at this time; no more loose BM since Imodium given; will cont. To monitor.

## 2013-01-24 NOTE — Progress Notes (Signed)
Pt given PO Imodium per pt request; will cont. To monitor.

## 2013-01-24 NOTE — Progress Notes (Signed)
Pt given PRN Imodium at this time per pt request; will cont. To monitor.

## 2013-01-25 LAB — GLUCOSE, CAPILLARY: Glucose-Capillary: 124 mg/dL — ABNORMAL HIGH (ref 70–99)

## 2013-01-25 NOTE — Progress Notes (Signed)
[facility] [place]  Daily Progress Note  Assessment/Planning: s/p Failed L fem-pop with vein s/p L fem-pop with Propaten complicated with wound infection treated with debridement and VAC placement   VAC off tomorrow, Dr. Darrick Penna to re-evaluate wound at that time  Check labs tomorrow given diarrhea  Subjective  No events overnight  Objective Filed Vitals:   01/24/13 0506 01/24/13 1356 01/24/13 1926 01/25/13 0428  BP: 115/55 108/42 111/48 123/40  Pulse: 75 83 79 71  Temp: 98 F (36.7 C) 97.4 F (36.3 C) 98.1 F (36.7 C) 97.8 F (36.6 C)  TempSrc: Oral Oral Oral Oral  Resp: 18 18 18 18   Height:      Weight:    175 lb 7.8 oz (79.6 kg)  SpO2: 95% 100% 98% 99%    Intake/Output Summary (Last 24 hours) at 01/25/13 0839 Last data filed at 01/25/13 0738  Gross per 24 hour  Intake    930 ml  Output    600 ml  Net    330 ml    PULM  CTAB CV  RRR GI  soft, NTND VASC  L leg: groin VAC adherent, upper thigh VAC not adherent (being addressed by nurses)  Laboratory CBC    Component Value Date/Time   WBC 5.9 01/23/2013 0500   WBC 6.3 12/01/2012 1008   HGB 8.6* 01/23/2013 0500   HGB 10.0* 12/01/2012 1008   HCT 27.5* 01/23/2013 0500   HCT 31.1* 12/01/2012 1008   PLT 451* 01/23/2013 0500   PLT 317 12/01/2012 1008    BMET    Component Value Date/Time   NA 137 01/14/2013 0530   NA 138 12/01/2012 1008   NA 147* 10/12/2011 0959   K 3.8 01/14/2013 0530   K 5.0 12/01/2012 1008   K 5.2* 10/12/2011 0959   CL 102 01/14/2013 0530   CL 102 05/19/2012 1427   CL 102 10/12/2011 0959   CO2 24 01/14/2013 0530   CO2 23 12/01/2012 1008   CO2 29 10/12/2011 0959   GLUCOSE 150* 01/14/2013 0530   GLUCOSE 195* 12/01/2012 1008   GLUCOSE 205* 05/19/2012 1427   GLUCOSE 244* 10/12/2011 0959   BUN 10 01/14/2013 0530   BUN 13.9 12/01/2012 1008   BUN 13 10/12/2011 0959   CREATININE 1.12 01/20/2013 0514   CREATININE 1.4* 12/01/2012 1008   CREATININE 1.0 10/12/2011 0959   CALCIUM 8.7 01/14/2013 0530    CALCIUM 9.9 12/01/2012 1008   CALCIUM 9.0 10/12/2011 0959   GFRNONAA 66* 01/20/2013 0514   GFRAA 76* 01/20/2013 0514    Leonides Sake, MD [facility] [place] Office: [phone] Pager: [phone]  01/25/2013, 8:39 AM

## 2013-01-25 NOTE — Progress Notes (Signed)
Pt given PO Imodium at this time per pt request; will cont. To monitor; only 1 BM today.

## 2013-01-26 LAB — GLUCOSE, CAPILLARY: Glucose-Capillary: 134 mg/dL — ABNORMAL HIGH (ref 70–99)

## 2013-01-26 LAB — BASIC METABOLIC PANEL
Calcium: 9 mg/dL (ref 8.4–10.5)
Creatinine, Ser: 1.3 mg/dL (ref 0.50–1.35)
GFR calc non Af Amer: 54 mL/min — ABNORMAL LOW (ref >90)
Glucose, Bld: 102 mg/dL — ABNORMAL HIGH (ref 70–99)
Sodium: 138 mEq/L (ref 135–145)

## 2013-01-26 LAB — CBC
MCH: 27.2 pg (ref 26.0–34.0)
MCHC: 31.6 g/dL (ref 30.0–36.0)
Platelets: 408 10*3/uL — ABNORMAL HIGH (ref 150–400)
RBC: 3.27 MIL/uL — ABNORMAL LOW (ref 4.22–5.81)
RDW: 16.1 % — ABNORMAL HIGH (ref 11.5–15.5)
WBC: 4.5 10*3/uL (ref 4.0–10.5)

## 2013-01-26 NOTE — Progress Notes (Signed)
[facility] [place]  Subjective  - No complaints  Objective 120/51 77 97.8 F (36.6 C) (Oral) 20 100%  Intake/Output Summary (Last 24 hours) at 01/26/13 1117 Last data filed at 01/26/13 2952  Gross per 24 hour  Intake    860 ml  Output    500 ml  Net    360 ml   Left groin clean overall but now with exposed graft at base of wound  Assessment/Planning: S/p Ax bifem and recent redo left fem pop now with poorly healing left groin and exposed graft.  Pt high risk for bleeding and graft failure.  Will need to continue VAC in hospital until there is granulation over the graft.  If he develops bleeding will most likely need removal of graft and will probably result in left leg or possibly bilateral limb loss.  Continue antibiotics for now.  Plan and details discussed with pt and his wife.  FIELDS,CHARLES E 01/26/2013 11:17 AM --  Laboratory Lab Results:  Recent Labs  01/26/13 0500  WBC 4.5  HGB 8.9*  HCT 28.2*  PLT 408*   BMET  Recent Labs  01/26/13 0500  NA 138  K 4.4  CL 102  CO2 25  GLUCOSE 102*  BUN 13  CREATININE 1.30  CALCIUM 9.0    COAG Lab Results  Component Value Date   INR 1.06 01/02/2013   INR 1.05 01/24/2012   INR 0.98 01/23/2012   No results found for this basename: PTT

## 2013-01-26 NOTE — Progress Notes (Signed)
ANTIBIOTIC CONSULT NOTE - FOLLOW UP  Pharmacy Consult for Vancomycin/Zosyn Indication: Wound infection s/p failed L-fem-pop  Allergies  Allergen Reactions  . Other     Became hypotensive. Pt states allergic to "muscle relaxer" but unsure of name.  . Tape Hives and Itching    medfix non woven tape    Patient Measurements: Height: 5' 8.9" (175 cm) Weight: 174 lb 9.7 oz (79.2 kg) IBW/kg (Calculated) : 70.47  Vital Signs: Temp: 97.7 F (36.5 C) (09/29 0450) Temp src: Oral (09/29 0450) BP: 137/61 mmHg (09/29 0450) Pulse Rate: 70 (09/29 0450) Intake/Output from previous day: 09/28 0701 - 09/29 0700 In: 740 [P.O.:540; IV Piggyback:200] Out: 800 [Urine:800] Intake/Output from this shift: Total I/O In: 360 [P.O.:360] Out: -   Labs:  Recent Labs  01/26/13 0500  WBC 4.5  HGB 8.9*  PLT 408*  CREATININE 1.30   Estimated Creatinine Clearance: 53.5 ml/min (by C-G formula based on Cr of 1.3). No results found for this basename: VANCOTROUGH, Leodis Binet, VANCORANDOM, GENTTROUGH, GENTPEAK, GENTRANDOM, TOBRATROUGH, TOBRAPEAK, TOBRARND, AMIKACINPEAK, AMIKACINTROU, AMIKACIN,  in the last 72 hours   Microbiology: Recent Results (from the past 720 hour(s))  SURGICAL PCR SCREEN     Status: Abnormal   Collection Time    01/02/13 12:08 PM      Result Value Range Status   MRSA, PCR NEGATIVE  NEGATIVE Final   Staphylococcus aureus POSITIVE (*) NEGATIVE Final   Comment:            The Xpert SA Assay (FDA     approved for NASAL specimens     in patients over 58 years of age),     is one component of     a comprehensive surveillance     program.  Test performance has     been validated by [employer] for patients greater     than or equal to 10 year old.     It is not intended     to diagnose infection nor to     guide or monitor treatment.  WOUND CULTURE     Status: None   Collection Time    01/10/13  8:39 AM      Result Value Range Status   Specimen Description WOUND LEFT  GROIN   Final   Special Requests NONE   Final   Gram Stain     Final   Value: MODERATE WBC PRESENT, PREDOMINANTLY PMN     NO SQUAMOUS EPITHELIAL CELLS SEEN     NO ORGANISMS SEEN     Performed at [facility]   Culture     Final   Value: NO GROWTH 2 DAYS     Performed at [facility]   Report Status 01/12/2013 FINAL   Final  MRSA PCR SCREENING     Status: None   Collection Time    01/10/13  3:53 PM      Result Value Range Status   MRSA by PCR NEGATIVE  NEGATIVE Final   Comment:            The GeneXpert MRSA Assay (FDA     approved for NASAL specimens     only), is one component of a     comprehensive MRSA colonization     surveillance program. It is not     intended to diagnose MRSA     infection nor to guide or     monitor treatment for  MRSA infections.  WOUND CULTURE     Status: None   Collection Time    01/19/13 11:30 PM      Result Value Range Status   Specimen Description WOUND LEFT GROIN   Final   Special Requests NONE   Final   Gram Stain     Final   Value: NO WBC SEEN     NO SQUAMOUS EPITHELIAL CELLS SEEN     ABUNDANT GRAM POSITIVE COCCI IN PAIRS     ABUNDANT GRAM NEGATIVE RODS     Performed at [facility]   Culture     Final   Value: MULTIPLE ORGANISMS PRESENT, NONE PREDOMINANT NO STAPHYLOCOCCUS AUREUS ISOLATED NO GROUP A STREP (S.PYOGENES) ISOLATED     Performed at [facility]   Report Status 01/21/2013 FINAL   Final  CLOSTRIDIUM DIFFICILE BY PCR     Status: None   Collection Time    01/23/13  3:57 PM      Result Value Range Status   C difficile by pcr NEGATIVE  NEGATIVE Final    Anti-infectives   Start     Dose/Rate Route Frequency Ordered Stop   01/23/13 1800  vancomycin (VANCOCIN) IVPB 750 mg/150 ml premix     750 mg 150 mL/hr over 60 Minutes Intravenous Every 12 hours 01/23/13 1306     01/22/13 0600  vancomycin (VANCOCIN) IVPB 1000 mg/200 mL premix  Status:  Discontinued     1,000 mg 200 mL/hr over 60 Minutes  Intravenous Every 12 hours 01/21/13 2223 01/23/13 1306   01/21/13 2300  piperacillin-tazobactam (ZOSYN) IVPB 3.375 g     3.375 g 12.5 mL/hr over 240 Minutes Intravenous 3 times per day 01/21/13 2223        Assessment: 67 y/o M with wound infection s/p failed L- fem-pop. WBC 4.5, afebrile, Scr 1.30 with CrCl ~ 50.   Vanc 9/23>>  9/26 VT 20.4 on 1gm IV q12h (decreased to 750 mg IV q12h) Zosyn 9/23>>  Wound Cx (from Rehab)>> multiple organisms  Goal of Therapy:  Vancomycin trough level 10-15 mcg/ml  Plan:  -Continue Vancomycin 750 mg IV q12h -Continue Zosyn 3.375G IV q8h to be infused over 4 hours -Trend WBC, temp, renal function  -F/U LOT, switch to PO antibiotics?  Thank you for allowing me to take part in this patient's care,  [name], PharmD Clinical Pharmacist Phone: [phone] Pager: [phone] 01/26/2013 9:06 AM

## 2013-01-26 NOTE — Progress Notes (Signed)
01/26/13 Wound VAC changed as ordered. Taleeyah Bora, [name] An RN

## 2013-01-26 NOTE — Progress Notes (Signed)
Patient given PO Imodium per patient request. Patient with 2Bms today will monitor patient. Samanthamarie Ezzell, [name] An RN

## 2013-01-26 NOTE — Progress Notes (Addendum)
Patient ambulated in hallway approximately 100 feet with walker x2 assist. Gait slightly unsteady, stated leg was sore back in room call bell within reach. Will continue to monitor patient. Solara Goodchild, [name] An rN

## 2013-01-26 NOTE — Consult Note (Signed)
Wound care follow-up: Dr Darrick Penna in earlier to remove vac dressings and assess wounds.  Bedside nurse states she will reapply vac dressing to both groin and thigh wounds and will use Mepitel to protect wound beds and white foam as directed.  She denies further questions regarding vac application. Cammie Mcgee MSN, RN, CWOCN, [place], CNS [phone]

## 2013-01-27 LAB — GLUCOSE, CAPILLARY
Glucose-Capillary: 150 mg/dL — ABNORMAL HIGH (ref 70–99)
Glucose-Capillary: 112 mg/dL — ABNORMAL HIGH (ref 70–99)

## 2013-01-27 NOTE — Progress Notes (Signed)
Patient given PO imodium per pt request will monitor. Evin Chirco, [name] An  rN

## 2013-01-27 NOTE — Progress Notes (Addendum)
[facility] Progress Note  01/27/2013 8:01 AM   Subjective:  No complaints and pain with good control  Afebrile VSS 97% RA  Filed Vitals:   01/27/13 0505  BP: 108/43  Pulse: 68  Temp: 97.8 F (36.6 C)  Resp: 18    Physical Exam: Incisions:  Wound vac with good seal   CBC    Component Value Date/Time   WBC 4.5 01/26/2013 0500   WBC 6.3 12/01/2012 1008   RBC 3.27* 01/26/2013 0500   RBC 3.87* 12/01/2012 1008   RBC 3.54* 01/26/2012 0455   HGB 8.9* 01/26/2013 0500   HGB 10.0* 12/01/2012 1008   HCT 28.2* 01/26/2013 0500   HCT 31.1* 12/01/2012 1008   PLT 408* 01/26/2013 0500   PLT 317 12/01/2012 1008   MCV 86.2 01/26/2013 0500   MCV 80.5 12/01/2012 1008   MCH 27.2 01/26/2013 0500   MCH 25.9* 12/01/2012 1008   MCHC 31.6 01/26/2013 0500   MCHC 32.2 12/01/2012 1008   RDW 16.1* 01/26/2013 0500   RDW 16.1* 12/01/2012 1008   LYMPHSABS 1.1 01/14/2013 0530   LYMPHSABS 1.8 12/01/2012 1008   MONOABS 0.7 01/14/2013 0530   MONOABS 0.7 12/01/2012 1008   EOSABS 0.1 01/14/2013 0530   EOSABS 0.1 12/01/2012 1008   BASOSABS 0.0 01/14/2013 0530   BASOSABS 0.1 12/01/2012 1008    BMET    Component Value Date/Time   NA 138 01/26/2013 0500   NA 138 12/01/2012 1008   NA 147* 10/12/2011 0959   K 4.4 01/26/2013 0500   K 5.0 12/01/2012 1008   K 5.2* 10/12/2011 0959   CL 102 01/26/2013 0500   CL 102 05/19/2012 1427   CL 102 10/12/2011 0959   CO2 25 01/26/2013 0500   CO2 23 12/01/2012 1008   CO2 29 10/12/2011 0959   GLUCOSE 102* 01/26/2013 0500   GLUCOSE 195* 12/01/2012 1008   GLUCOSE 205* 05/19/2012 1427   GLUCOSE 244* 10/12/2011 0959   BUN 13 01/26/2013 0500   BUN 13.9 12/01/2012 1008   BUN 13 10/12/2011 0959   CREATININE 1.30 01/26/2013 0500   CREATININE 1.4* 12/01/2012 1008   CREATININE 1.0 10/12/2011 0959   CALCIUM 9.0 01/26/2013 0500   CALCIUM 9.9 12/01/2012 1008   CALCIUM 9.0 10/12/2011 0959   GFRNONAA 54* 01/26/2013 0500   GFRAA 63* 01/26/2013 0500    INR    Component Value Date/Time   INR 1.06 01/02/2013 1214      Intake/Output Summary (Last 24 hours) at 01/27/13 0801 Last data filed at 01/27/13 0700  Gross per 24 hour  Intake    840 ml  Output    900 ml  Net    -60 ml     Assessment:  67 y.o. male is s/p:  Ax bifem and recent redo left fem pop now with poorly healing left groin and exposed graft. Pt high risk for bleeding and graft failure.     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  Will need to stay in hospital until some granulation over graft otherwise bleeding risk is too high  Fabienne Bruns, MD [facility] [place] Office: [phone] Pager: [phone]

## 2013-01-27 NOTE — Progress Notes (Signed)
Physical Therapy Treatment Patient Details Name: Donald [name] MRN: [id number] DOB: 1945/10/23 Today's Date: 01/27/2013 Time: 7846-9629 PT Time Calculation (min): 27 min  PT Assessment / Plan / Recommendation  History of Present Illness Left groin wound. Donald [name] is a 67 y.o. male who underwent axillary bifemoral bypass 01/29/2012. with fempop BPG and thrombectomy 01/10/13. 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 DC to CIR and returned to acute with open wound at left groin. VAC placed 9/25    PT Comments   Pt progressing with ambulation today with increased distance but limited from prior distances by pain at groin and VAC sites. Pt educated for safety with gait and encouraged to continue HEP and ambulation with nursing with RW.  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 Restrictions Weight Bearing Restrictions: No   Pertinent Vitals/Pain 4/10 left groin    Mobility  Bed Mobility Supine to Sit: 6: Modified independent (Device/Increase time);With rails;HOB elevated Details for Bed Mobility Assistance: assist for lines only Transfers Sit to Stand: 5: Supervision;From bed Stand to Sit: 5: Supervision;To chair/3-in-1 Details for Transfer Assistance: cues for safety as pt slightly impulsive  Ambulation/Gait Ambulation/Gait Assistance: 4: Min guard Ambulation Distance (Feet): 200 Feet Assistive device: Straight cane Ambulation/Gait Assistance Details: 3 standing rests to recover balance and to rest as pt with increased bil LE fatigue with gait. No  significant LOB with use of cane Gait Pattern: Step-to pattern;Decreased step length - left;Decreased weight shift to left;Left circumduction;Decreased dorsiflexion - left Gait velocity: decreased Stairs: No    Exercises General Exercises - Lower Extremity Short Arc Quad: AROM;5 reps;Left Long Arc Quad: AROM;Right;10 reps;Seated Hip Flexion/Marching: AROM;Both;15 reps;Seated   PT Diagnosis:    PT Problem List:   PT Treatment Interventions:     PT Goals (current goals can now be found in the care plan section)    Visit Information  Last PT Received On: 01/27/13 Assistance Needed: +1 History of Present Illness: Left groin wound. Donald [name] is a 67 y.o. male who underwent axillary bifemoral bypass 01/29/2012. with fempop BPG and thrombectomy 01/10/13. 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 DC to CIR and returned to acute with open wound at left groin. VAC placed 9/25     Subjective Data      Cognition  Cognition Arousal/Alertness: Awake/alert Behavior During Therapy: WFL for tasks assessed/performed Overall Cognitive Status: Within Functional Limits for tasks assessed    Balance     End of Session PT - End of Session Equipment Utilized During Treatment: Gait belt Activity Tolerance: Patient limited by pain Patient left: in chair;with call bell/phone within reach;with family/visitor present Nurse Communication: Mobility status   GP     Toney Sang Beth 01/27/2013, 11:05 AM Delaney Meigs, PT [phone]

## 2013-01-28 LAB — GLUCOSE, CAPILLARY: Glucose-Capillary: 140 mg/dL — ABNORMAL HIGH (ref 70–99)

## 2013-01-28 NOTE — Progress Notes (Signed)
Pt tolerated ambulation in hallway 215ft  with rolling walker, VAC  In place and accompained by RN Durwin Glaze, Ranae Plumber

## 2013-01-28 NOTE — Progress Notes (Signed)
[facility]   Subjective  - Bored   Objective 109/46 75 97.6 F (36.4 C) (Oral) 18 94%  Intake/Output Summary (Last 24 hours) at 01/28/13 1201 Last data filed at 01/28/13 0806  Gross per 24 hour  Intake   1740 ml  Output    550 ml  Net   1190 ml   Left groin wound clean, exposed graft at base, maybe some granulation over this Feel warm bilaterally  Assessment/Planning: Poorly healing left groin wound no significant change will continue VAC inpt until some coverage over graft  FIELDS,CHARLES E 01/28/2013 12:01 PM --  Laboratory Lab Results:  Recent Labs  01/26/13 0500  WBC 4.5  HGB 8.9*  HCT 28.2*  PLT 408*   BMET  Recent Labs  01/26/13 0500  NA 138  K 4.4  CL 102  CO2 25  GLUCOSE 102*  BUN 13  CREATININE 1.30  CALCIUM 9.0    COAG Lab Results  Component Value Date   INR 1.06 01/02/2013   INR 1.05 01/24/2012   INR 0.98 01/23/2012   No results found for this basename: PTT

## 2013-01-29 NOTE — Progress Notes (Signed)
[facility] [place]  Subjective  - No complaints   Objective 131/90 67 98.1 F (36.7 C) (Oral) 18 95%  Intake/Output Summary (Last 24 hours) at 01/29/13 0904 Last data filed at 01/29/13 0700  Gross per 24 hour  Intake    940 ml  Output   1025 ml  Net    -85 ml   Both feet pink, warm Still with some drainage medial left thigh VAC on left groin  Assessment/Planning: Continue to ambulate Change VAC tomorrow Will need to consider adding VAC back to thigh if drainage persists  Donald [name] E 01/29/2013 9:04 AM --  Laboratory Lab Results: No results found for this basename: WBC, HGB, HCT, PLT,  in the last 72 hours BMET No results found for this basename: NA, K, CL, CO2, GLUCOSE, BUN, CREATININE, CALCIUM,  in the last 72 hours  COAG Lab Results  Component Value Date   INR 1.06 01/02/2013   INR 1.05 01/24/2012   INR 0.98 01/23/2012   No results found for this basename: PTT

## 2013-01-29 NOTE — Progress Notes (Signed)
Patient evaluated for community based chronic disease management services with [facility] Care Management Program as a benefit of patient's [employer]. Spoke with patient at bedside to explain [facility] Care Management services.  Services have been accepted.  Admitted on 9.24.14 for deconditioning related to multiple medical issues, peripheral vascular disease status post axillary bifemoral bypass 01/29/2012 as well as CVA with spastic left hemiparesis.  He has been maintained on Aggrenox therapy and has a history of tobacco abuse.  Has a supportive wife at home that manages his medications, meals, and medical appointments.  Patient will receive a post discharge transition of care call and will be evaluated for monthly home visits for assessments and cardiovascular disease process/diabetes/smoking cessation education.  Left contact information and [facility] literature at bedside. Made inpatient Case Manager aware that [facility] Care Management following. Of note, [facility] Care Management services does not replace or interfere with any services that are arranged by inpatient case management or social work.  For additional questions or referrals please contact Anibal Henderson BSN RN [facility] Liaison at [phone].

## 2013-01-29 NOTE — Progress Notes (Signed)
ANTIBIOTIC CONSULT NOTE - FOLLOW UP  Pharmacy Consult for Vancomycin/Zosyn Indication: Wound infection s/p failed L-fem-pop  Allergies  Allergen Reactions  . Other     Became hypotensive. Pt states allergic to "muscle relaxer" but unsure of name.  . Tape Hives and Itching    medfix non woven tape    Patient Measurements: Height: 5' 8.9" (175 cm) Weight: 174 lb 9.7 oz (79.2 kg) IBW/kg (Calculated) : 70.47  Vital Signs: Temp: 98.1 F (36.7 C) (10/02 0500) Temp src: Oral (10/02 0500) BP: 131/90 mmHg (10/02 0500) Pulse Rate: 67 (10/02 0500) Intake/Output from previous day: 10/01 0701 - 10/02 0700 In: 1300 [P.O.:1300] Out: 1226 [Urine:1225; Stool:1] Intake/Output from this shift:    Labs: No results found for this basename: WBC, HGB, PLT, LABCREA, CREATININE,  in the last 72 hours Estimated Creatinine Clearance: 53.5 ml/min (by C-G formula based on Cr of 1.3). No results found for this basename: VANCOTROUGH, Donald [name], VANCORANDOM, GENTTROUGH, GENTPEAK, GENTRANDOM, TOBRATROUGH, TOBRAPEAK, TOBRARND, AMIKACINPEAK, AMIKACINTROU, AMIKACIN,  in the last 72 hours   Microbiology: Recent Results (from the past 720 hour(s))  SURGICAL PCR SCREEN     Status: Abnormal   Collection Time    01/02/13 12:08 PM      Result Value Range Status   MRSA, PCR NEGATIVE  NEGATIVE Final   Staphylococcus aureus POSITIVE (*) NEGATIVE Final   Comment:            The Xpert SA Assay (FDA     approved for NASAL specimens     in patients over 34 years of age),     is one component of     a comprehensive surveillance     program.  Test performance has     been validated by [employer] for patients greater     than or equal to 29 year old.     It is not intended     to diagnose infection nor to     guide or monitor treatment.  WOUND CULTURE     Status: None   Collection Time    01/10/13  8:39 AM      Result Value Range Status   Specimen Description WOUND LEFT GROIN   Final   Special  Requests NONE   Final   Gram Stain     Final   Value: MODERATE WBC PRESENT, PREDOMINANTLY PMN     NO SQUAMOUS EPITHELIAL CELLS SEEN     NO ORGANISMS SEEN     Performed at [facility]   Culture     Final   Value: NO GROWTH 2 DAYS     Performed at [facility]   Report Status 01/12/2013 FINAL   Final  MRSA PCR SCREENING     Status: None   Collection Time    01/10/13  3:53 PM      Result Value Range Status   MRSA by PCR NEGATIVE  NEGATIVE Final   Comment:            The GeneXpert MRSA Assay (FDA     approved for NASAL specimens     only), is one component of a     comprehensive MRSA colonization     surveillance program. It is not     intended to diagnose MRSA     infection nor to guide or     monitor treatment for     MRSA infections.  WOUND CULTURE     Status:  None   Collection Time    01/19/13 11:30 PM      Result Value Range Status   Specimen Description WOUND LEFT GROIN   Final   Special Requests NONE   Final   Gram Stain     Final   Value: NO WBC SEEN     NO SQUAMOUS EPITHELIAL CELLS SEEN     ABUNDANT GRAM POSITIVE COCCI IN PAIRS     ABUNDANT GRAM NEGATIVE RODS     Performed at [facility]   Culture     Final   Value: MULTIPLE ORGANISMS PRESENT, NONE PREDOMINANT NO STAPHYLOCOCCUS AUREUS ISOLATED NO GROUP A STREP (S.PYOGENES) ISOLATED     Performed at [facility]   Report Status 01/21/2013 FINAL   Final  CLOSTRIDIUM DIFFICILE BY PCR     Status: None   Collection Time    01/23/13  3:57 PM      Result Value Range Status   C difficile by pcr NEGATIVE  NEGATIVE Final    Anti-infectives   Start     Dose/Rate Route Frequency Ordered Stop   01/23/13 1800  vancomycin (VANCOCIN) IVPB 750 mg/150 ml premix     750 mg 150 mL/hr over 60 Minutes Intravenous Every 12 hours 01/23/13 1306     01/22/13 0600  vancomycin (VANCOCIN) IVPB 1000 mg/200 mL premix  Status:  Discontinued     1,000 mg 200 mL/hr over 60 Minutes Intravenous Every 12 hours  01/21/13 2223 01/23/13 1306   01/21/13 2300  piperacillin-tazobactam (ZOSYN) IVPB 3.375 g     3.375 g 12.5 mL/hr over 240 Minutes Intravenous 3 times per day 01/21/13 2223        Assessment: 67 y/o M with wound infection s/p failed L- fem-pop. WBC 4.5, afebrile, Scr 1.30 with CrCl ~ 50 (last labs from 9/29).   Vanc 9/23>>  9/26 VT 20.4 on 1gm IV q12h (decreased to 750 mg IV q12h) Zosyn 9/23>>  Wound Cx (from Rehab)>> multiple organisms 9/26 C diff NEG  Goal of Therapy:  Vancomycin trough level 10-15 mcg/ml  Plan:  -Continue Vancomycin 750 mg IV q12h -Continue Zosyn 3.375G IV q8h to be infused over 4 hours -Trend WBC, temp, renal function  -F/U plans for antibiotics at discharge  Thank you for allowing me to take part in this patient's care,  Donald [name], PharmD Clinical Pharmacist Phone: [phone] Pager: [phone] 01/29/2013 12:07 PM

## 2013-01-30 LAB — GLUCOSE, CAPILLARY
Glucose-Capillary: 125 mg/dL — ABNORMAL HIGH (ref 70–99)
Glucose-Capillary: 137 mg/dL — ABNORMAL HIGH (ref 70–99)
Glucose-Capillary: 154 mg/dL — ABNORMAL HIGH (ref 70–99)
Glucose-Capillary: 120 mg/dL — ABNORMAL HIGH (ref 70–99)
Glucose-Capillary: 119 mg/dL — ABNORMAL HIGH (ref 70–99)
Glucose-Capillary: 123 mg/dL — ABNORMAL HIGH (ref 70–99)

## 2013-01-30 NOTE — Progress Notes (Signed)
Physical Therapy Treatment Patient Details Name: HILMAN KISSLING MRN: [id number] DOB: 12/18/1945 Today's Date: 01/30/2013 Time: 8295-6213 PT Time Calculation (min): 27 min  PT Assessment / Plan / Recommendation  History of Present Illness Left groin wound. BRYSTEN REISTER is a 67 y.o. male who underwent axillary bifemoral bypass 01/29/2012. with fempop BPG and thrombectomy 01/10/13. 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 DC to CIR and returned to acute with open wound at left groin. VAC placed 9/25    PT Comments   Pt able to try ambulation x 2 today with seated rest and no standing rests required. Pt encouraged to continue ambulation with nursing. Pt denied attempting standing HEP today due to pain/fatigue at right hip. Improved strength with HEP noted today.  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   Pertinent Vitals/Pain Soreness grossly 4/10 right hip, premedicated, repositioned   Mobility  Bed Mobility Bed Mobility: Not assessed Transfers Sit to Stand: 6: Modified independent (Device/Increase time);From chair/3-in-1;With armrests Stand to Sit: 5: Supervision;To chair/3-in-1;With armrests Details for Transfer Assistance: cues for safety  Ambulation/Gait Ambulation/Gait Assistance: 4: Min guard Ambulation Distance (Feet): 150 Feet (150, 100' with seated rest) Assistive device: Straight cane Ambulation/Gait Assistance Details: no standing rest breaks required Gait Pattern: Step-to pattern;Decreased step length - left;Decreased weight shift to left;Left  circumduction;Decreased dorsiflexion - left Gait velocity: decreased Stairs: No    Exercises General Exercises - Lower Extremity Long Arc Quad: AROM;Seated;Both;15 reps Hip Flexion/Marching: AROM;Both;20 reps;Seated Other Exercises Other Exercises: pt performed right piriformis stretch x2   PT Diagnosis:    PT Problem List:   PT Treatment Interventions:     PT Goals (current goals can now be found in the care plan section)    Visit Information  Last PT Received On: 01/30/13 Assistance Needed: +1 History of Present Illness: Left groin wound. NAYDEN CZAJKA is a 67 y.o. male who underwent axillary bifemoral bypass 01/29/2012. with fempop BPG and thrombectomy 01/10/13. 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 DC to CIR and returned to acute with open wound at left groin. VAC placed 9/25     Subjective Data      Cognition  Cognition Arousal/Alertness: Awake/alert Behavior During Therapy: WFL for tasks assessed/performed Overall Cognitive Status: Within Functional Limits for tasks assessed    Balance     End of Session PT - End of Session Equipment Utilized During Treatment: Gait belt Activity Tolerance: Patient limited by fatigue Patient left: in chair;with call bell/phone within reach;with family/visitor present Nurse Communication: Mobility status   GP     Delorse Lek 01/30/2013, 2:16 PM Delaney Meigs, PT [phone]

## 2013-01-31 LAB — GLUCOSE, CAPILLARY
Glucose-Capillary: 114 mg/dL — ABNORMAL HIGH (ref 70–99)
Glucose-Capillary: 104 mg/dL — ABNORMAL HIGH (ref 70–99)

## 2013-01-31 NOTE — Progress Notes (Addendum)
[facility] Progress Note  01/31/2013 9:26 AM HD 10  Subjective:  States he wants to get out of the hospital and is starting to feel down  Afebrile VSS 91%-100% RA Filed Vitals:   01/31/13 0436  BP: 99/65  Pulse: 76  Temp: 98 F (36.7 C)  Resp: 17    Physical Exam: Cardiac:  regular Lungs:  CTAB Abdomen:  Soft NT/ND +BS +BM Incision:  Left above knee incision with minimal drainage on bandage-it is clean and in tact; left groin wound with vac in place with good seal.   CBC    Component Value Date/Time   WBC 4.5 01/26/2013 0500   WBC 6.3 12/01/2012 1008   RBC 3.27* 01/26/2013 0500   RBC 3.87* 12/01/2012 1008   RBC 3.54* 01/26/2012 0455   HGB 8.9* 01/26/2013 0500   HGB 10.0* 12/01/2012 1008   HCT 28.2* 01/26/2013 0500   HCT 31.1* 12/01/2012 1008   PLT 408* 01/26/2013 0500   PLT 317 12/01/2012 1008   MCV 86.2 01/26/2013 0500   MCV 80.5 12/01/2012 1008   MCH 27.2 01/26/2013 0500   MCH 25.9* 12/01/2012 1008   MCHC 31.6 01/26/2013 0500   MCHC 32.2 12/01/2012 1008   RDW 16.1* 01/26/2013 0500   RDW 16.1* 12/01/2012 1008   LYMPHSABS 1.1 01/14/2013 0530   LYMPHSABS 1.8 12/01/2012 1008   MONOABS 0.7 01/14/2013 0530   MONOABS 0.7 12/01/2012 1008   EOSABS 0.1 01/14/2013 0530   EOSABS 0.1 12/01/2012 1008   BASOSABS 0.0 01/14/2013 0530   BASOSABS 0.1 12/01/2012 1008    BMET    Component Value Date/Time   NA 138 01/26/2013 0500   NA 138 12/01/2012 1008   NA 147* 10/12/2011 0959   K 4.4 01/26/2013 0500   K 5.0 12/01/2012 1008   K 5.2* 10/12/2011 0959   CL 102 01/26/2013 0500   CL 102 05/19/2012 1427   CL 102 10/12/2011 0959   CO2 25 01/26/2013 0500   CO2 23 12/01/2012 1008   CO2 29 10/12/2011 0959   GLUCOSE 102* 01/26/2013 0500   GLUCOSE 195* 12/01/2012 1008   GLUCOSE 205* 05/19/2012 1427   GLUCOSE 244* 10/12/2011 0959   BUN 13 01/26/2013 0500   BUN 13.9 12/01/2012 1008   BUN 13 10/12/2011 0959   CREATININE 1.30 01/26/2013 0500   CREATININE 1.4* 12/01/2012 1008   CREATININE 1.0 10/12/2011 0959   CALCIUM 9.0 01/26/2013 0500   CALCIUM 9.9 12/01/2012 1008   CALCIUM 9.0 10/12/2011 0959   GFRNONAA 54* 01/26/2013 0500   GFRAA 63* 01/26/2013 0500    INR    Component Value Date/Time   INR 1.06 01/02/2013 1214     Intake/Output Summary (Last 24 hours) at 01/31/13 0926 Last data filed at 01/31/13 0700  Gross per 24 hour  Intake    450 ml  Output   1625 ml  Net  -1175 ml     Assessment/Plan:  67 y.o. male is  Admitted for Ax bifem and recent redo left fem pop now with poorly healing left groin and exposed graft. Pt high risk for bleeding and graft failure  HD 10  -wound vac change on Monday-to assess wound; at last check, there was still graft exposed.  It needs more granulation tissue over graft before discharge as bleeding risk is high. -continue Vancomycin and Zosyn -pt may leave floor/go outside to front of hospital to boost spirits.  May not leave due to high risk of bleeding.   Lelon Mast  [facility] [phone] 01/31/2013 9:26 AM  Foot warm, bypass patent VAC left groin Reassess on Monday Home when has tissue coverage over graft can go home Will allow for outside visits  Fabienne Bruns, MD [facility] [place] Office: [phone] Pager: [phone]

## 2013-02-01 LAB — GLUCOSE, CAPILLARY
Glucose-Capillary: 124 mg/dL — ABNORMAL HIGH (ref 70–99)
Glucose-Capillary: 137 mg/dL — ABNORMAL HIGH (ref 70–99)
Glucose-Capillary: 153 mg/dL — ABNORMAL HIGH (ref 70–99)

## 2013-02-01 NOTE — Progress Notes (Signed)
ANTIBIOTIC CONSULT NOTE - FOLLOW UP  Pharmacy Consult for Vancomycin/Zosyn Indication: Wound infection s/p failed L-fem-pop  Allergies  Allergen Reactions  . Other     Became hypotensive. Pt states allergic to "muscle relaxer" but unsure of name.  . Tape Hives and Itching    medfix non woven tape    Patient Measurements: Height: 5' 8.9" (175 cm) Weight: 174 lb 9.7 oz (79.2 kg) IBW/kg (Calculated) : 70.47  Vital Signs: Temp: 98 F (36.7 C) (10/05 0438) Temp src: Oral (10/05 0438) BP: 124/56 mmHg (10/05 0438) Pulse Rate: 69 (10/05 0438) Intake/Output from previous day: 10/04 0701 - 10/05 0700 In: 250 [IV Piggyback:250] Out: 1100 [Urine:1100] Intake/Output from this shift: Total I/O In: 720 [P.O.:720] Out: 250 [Urine:250]  Labs: No results found for this basename: WBC, HGB, PLT, LABCREA, CREATININE,  in the last 72 hours Estimated Creatinine Clearance: 53.5 ml/min (by C-G formula based on Cr of 1.3). No results found for this basename: VANCOTROUGH, Leodis Binet, VANCORANDOM, GENTTROUGH, GENTPEAK, GENTRANDOM, TOBRATROUGH, TOBRAPEAK, TOBRARND, AMIKACINPEAK, AMIKACINTROU, AMIKACIN,  in the last 72 hours   Microbiology: Recent Results (from the past 720 hour(s))  WOUND CULTURE     Status: None   Collection Time    01/10/13  8:39 AM      Result Value Range Status   Specimen Description WOUND LEFT GROIN   Final   Special Requests NONE   Final   Gram Stain     Final   Value: MODERATE WBC PRESENT, PREDOMINANTLY PMN     NO SQUAMOUS EPITHELIAL CELLS SEEN     NO ORGANISMS SEEN     Performed at [facility]   Culture     Final   Value: NO GROWTH 2 DAYS     Performed at [facility]   Report Status 01/12/2013 FINAL   Final  MRSA PCR SCREENING     Status: None   Collection Time    01/10/13  3:53 PM      Result Value Range Status   MRSA by PCR NEGATIVE  NEGATIVE Final   Comment:            The GeneXpert MRSA Assay (FDA     approved for NASAL specimens   only), is one component of a     comprehensive MRSA colonization     surveillance program. It is not     intended to diagnose MRSA     infection nor to guide or     monitor treatment for     MRSA infections.  WOUND CULTURE     Status: None   Collection Time    01/19/13 11:30 PM      Result Value Range Status   Specimen Description WOUND LEFT GROIN   Final   Special Requests NONE   Final   Gram Stain     Final   Value: NO WBC SEEN     NO SQUAMOUS EPITHELIAL CELLS SEEN     ABUNDANT GRAM POSITIVE COCCI IN PAIRS     ABUNDANT GRAM NEGATIVE RODS     Performed at [facility]   Culture     Final   Value: MULTIPLE ORGANISMS PRESENT, NONE PREDOMINANT NO STAPHYLOCOCCUS AUREUS ISOLATED NO GROUP A STREP (S.PYOGENES) ISOLATED     Performed at [facility]   Report Status 01/21/2013 FINAL   Final  CLOSTRIDIUM DIFFICILE BY PCR     Status: None   Collection Time    01/23/13  3:57 PM  Result Value Range Status   C difficile by pcr NEGATIVE  NEGATIVE Final    Anti-infectives   Start     Dose/Rate Route Frequency Ordered Stop   01/23/13 1800  vancomycin (VANCOCIN) IVPB 750 mg/150 ml premix     750 mg 150 mL/hr over 60 Minutes Intravenous Every 12 hours 01/23/13 1306     01/22/13 0600  vancomycin (VANCOCIN) IVPB 1000 mg/200 mL premix  Status:  Discontinued     1,000 mg 200 mL/hr over 60 Minutes Intravenous Every 12 hours 01/21/13 2223 01/23/13 1306   01/21/13 2300  piperacillin-tazobactam (ZOSYN) IVPB 3.375 g     3.375 g 12.5 mL/hr over 240 Minutes Intravenous 3 times per day 01/21/13 2223        Assessment: 67 y/o M with wound infection s/p failed L- fem-pop. WBC 4.5, afebrile, Scr 1.30 with CrCl ~ 50 (last labs from 9/29).   Vanc 9/23>>  9/26 VT 20.4 on 1gm IV q12h (decreased to 750 mg IV q12h) Zosyn 9/23>>  Wound Cx (from Rehab)>> multiple organisms 9/26 C diff NEG  Goal of Therapy:  Vancomycin trough level 10-15 mcg/ml  Plan:  -Continue Vancomycin 750 mg  IV q12h -Continue Zosyn 3.375G IV q8h to be infused over 4 hours -Trend WBC, temp, renal function  -F/U plans for antibiotics at discharge -Will check vancomycin trough 10/6 in case pt is to go home on IV antibiotics  Thank you for allowing me to take part in this patient's care,  [name], PharmD Clinical Pharmacist Phone: [phone] Pager: [phone] 02/01/2013 12:51 PM

## 2013-02-01 NOTE — Progress Notes (Addendum)
[facility] Progress Note  02/01/2013 8:49 AM HD 11  Subjective:  Feeling down and wants to go home.  States above knee left leg has stopped draining.  States his left foot feels better without the wrap.  Afebrile  VSS  Filed Vitals:   02/01/13 0438  BP: 124/56  Pulse: 69  Temp: 98 F (36.7 C)  Resp: 19    Physical Exam: Lungs:  Non labored Extremities:  Left groin with wound vac in place with good seal.  Left above knee incision c/d/i.  Minimal drainage on bandage.  CBC    Component Value Date/Time   WBC 4.5 01/26/2013 0500   WBC 6.3 12/01/2012 1008   RBC 3.27* 01/26/2013 0500   RBC 3.87* 12/01/2012 1008   RBC 3.54* 01/26/2012 0455   HGB 8.9* 01/26/2013 0500   HGB 10.0* 12/01/2012 1008   HCT 28.2* 01/26/2013 0500   HCT 31.1* 12/01/2012 1008   PLT 408* 01/26/2013 0500   PLT 317 12/01/2012 1008   MCV 86.2 01/26/2013 0500   MCV 80.5 12/01/2012 1008   MCH 27.2 01/26/2013 0500   MCH 25.9* 12/01/2012 1008   MCHC 31.6 01/26/2013 0500   MCHC 32.2 12/01/2012 1008   RDW 16.1* 01/26/2013 0500   RDW 16.1* 12/01/2012 1008   LYMPHSABS 1.1 01/14/2013 0530   LYMPHSABS 1.8 12/01/2012 1008   MONOABS 0.7 01/14/2013 0530   MONOABS 0.7 12/01/2012 1008   EOSABS 0.1 01/14/2013 0530   EOSABS 0.1 12/01/2012 1008   BASOSABS 0.0 01/14/2013 0530   BASOSABS 0.1 12/01/2012 1008    BMET    Component Value Date/Time   NA 138 01/26/2013 0500   NA 138 12/01/2012 1008   NA 147* 10/12/2011 0959   K 4.4 01/26/2013 0500   K 5.0 12/01/2012 1008   K 5.2* 10/12/2011 0959   CL 102 01/26/2013 0500   CL 102 05/19/2012 1427   CL 102 10/12/2011 0959   CO2 25 01/26/2013 0500   CO2 23 12/01/2012 1008   CO2 29 10/12/2011 0959   GLUCOSE 102* 01/26/2013 0500   GLUCOSE 195* 12/01/2012 1008   GLUCOSE 205* 05/19/2012 1427   GLUCOSE 244* 10/12/2011 0959   BUN 13 01/26/2013 0500   BUN 13.9 12/01/2012 1008   BUN 13 10/12/2011 0959   CREATININE 1.30 01/26/2013 0500   CREATININE 1.4* 12/01/2012 1008   CREATININE 1.0 10/12/2011 0959   CALCIUM  9.0 01/26/2013 0500   CALCIUM 9.9 12/01/2012 1008   CALCIUM 9.0 10/12/2011 0959   GFRNONAA 54* 01/26/2013 0500   GFRAA 63* 01/26/2013 0500    INR    Component Value Date/Time   INR 1.06 01/02/2013 1214     Intake/Output Summary (Last 24 hours) at 02/01/13 0849 Last data filed at 02/01/13 0812  Gross per 24 hour  Intake    730 ml  Output   1100 ml  Net   -370 ml     Assessment/Plan:  67 y.o. male is  Admitted for Ax bifem and recent redo left fem pop now with poorly healing left groin and exposed graft. Pt high risk for bleeding and graft failure HD 11  -wound vac change of left groin tomorrow to assess wound.  Wound needs more granulation tissue before pt can be discharged as risk of bleeding is high. -continue ABx -above knee dressing changed.  Minimal drainage. -pt's diarrhea is improving per RN.  C. Diff negative this admission. -discussed with pt that he can leave the room to go  outside for a few minutes or up to the solarium to boost spirits.  He did not go yesterday b/c of being afraid of having a bowel accident outside his room.  Doreatha Massed, PA-C [facility] [phone] 02/01/2013 8:49 AM    No significant change.  Recheck VAC wound tomorrow.  Continue current care  Fabienne Bruns, MD [facility] [place] Office: [phone] Pager: [phone]

## 2013-02-02 LAB — BASIC METABOLIC PANEL
BUN: 15 mg/dL (ref 6–23)
Chloride: 98 mEq/L (ref 96–112)
GFR calc Af Amer: 77 mL/min — ABNORMAL LOW (ref >90)
Glucose, Bld: 128 mg/dL — ABNORMAL HIGH (ref 70–99)
Potassium: 4.3 mEq/L (ref 3.5–5.1)
Sodium: 136 mEq/L (ref 135–145)

## 2013-02-02 LAB — GLUCOSE, CAPILLARY
Glucose-Capillary: 135 mg/dL — ABNORMAL HIGH (ref 70–99)
Glucose-Capillary: 134 mg/dL — ABNORMAL HIGH (ref 70–99)
Glucose-Capillary: 162 mg/dL — ABNORMAL HIGH (ref 70–99)

## 2013-02-02 MED ORDER — VANCOMYCIN HCL IN DEXTROSE 1-5 GM/200ML-% IV SOLN
1000.0000 mg | INTRAVENOUS | Status: DC
  Administered 2013-02-03: 1000 mg via INTRAVENOUS
  Filled 2013-02-02: qty 200

## 2013-02-02 NOTE — Progress Notes (Signed)
Pharmacy - Vancomycin  Vancomycin trough = 20.7 mcg / dL Wound infection s/p failed L fem-pop  Plan: 1) Decrease Vancomycin to 1 Gram IV Q 24 hours 2) What is plan for IV Zosyn and Vancomycin?  Thank you. Okey Regal, PharmD

## 2013-02-02 NOTE — Progress Notes (Signed)
Ambulate approx 150 ft with front wheel walker.  Pt tolerated well

## 2013-02-02 NOTE — Progress Notes (Addendum)
[facility] [place]  Subjective - Bored  Objective  Filed Vitals:   02/01/13 2113 02/02/13 0335 02/02/13 0750 02/02/13 1326  BP: 122/71 103/65 97/68 110/42  Pulse: 73 70 79 79  Temp: 98 F (36.7 C) 97.6 F (36.4 C) 97.5 F (36.4 C) 97.7 F (36.5 C)  TempSrc: Oral Oral Oral Oral  Resp: 18 18 18 18   Height:      Weight:      SpO2: 96% 92% 91% 94%   Left groin wound clean, exposed graft at base Feel warm bilaterally    Assessment/Planning:  Poorly healing left groin wound no significant change will continue VAC inpt until some coverage over graft  Day #14 of antibiotics tomorrow Will d/c IV antibiotics tomorrow and reculture on Wednesday with VAC change  Donald [name] E  01/28/2013  12:01 PM

## 2013-02-03 NOTE — Progress Notes (Signed)
Physical Therapy Treatment Patient Details Name: Donald [name] MRN: [id number] DOB: May 31, 1945 Today's Date: 02/03/2013 Time: 4098-1191 PT Time Calculation (min): 20 min  PT Assessment / Plan / Recommendation  History of Present Illness Left groin wound. Donald [name] is a 67 y.o. male who underwent axillary bifemoral bypass 01/29/2012. with fempop BPG and thrombectomy 01/10/13. 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 DC to CIR and returned to acute with open wound at left groin. VAC placed 9/25    PT Comments   Pt performed bil LE strengthening exercises in seated and supine position, pt required increased time to perform all LE exercises due to fatigue. Pt did not wish to ambulate as he reports ambulating after lunch and LLE pain increased to 10/10 during ambulation. Pt stated he uses SPC and RW for ambulation, PT educated pt on the importance of using RW when he is fatigued and to decrease LLE pain for safety. Pt would continue to benefit from skilled PT in order to improve functional mobility and safety.  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 Pt reports he recently received pain medication after ambulation due to 10/10 LLE pain.    Mobility  Bed Mobility Bed Mobility: Not assessed Details for Bed Mobility Assistance: pt sitting in chair upon arrival Transfers Transfers: Not assessed Details for Transfer  Assistance: pt and pt's wife state pt walked after lunch and was too tired to ambulate again, pt also reports LLE pain increased to 10/10 during amb. so he did not wish to try again this afternoon. Ambulation/Gait Ambulation/Gait Assistance: Not tested (comment) Ambulation/Gait Assistance Details: Pt ambulated after lunch today and did not wish to amb. again due to pain and fatigue.    Exercises General Exercises - Lower Extremity Ankle Circles/Pumps: AROM;Other reps (comment);Seated;Both (30 reps) Long Arc Quad: AROM;Seated;Both;20 reps Hip ABduction/ADduction: AROM;Both;20 reps;Supine Straight Leg Raises: AROM;Both;10 reps;Supine Hip Flexion/Marching: AROM;Both;Seated;Other reps (comment) (30 reps)   PT Diagnosis:    PT Problem List:   PT Treatment Interventions:     PT Goals (current goals can now be found in the care plan section)    Visit Information  Last PT Received On: 02/03/13 Assistance Needed: +1 History of Present Illness: Left groin wound. Donald [name] is a 67 y.o. male who underwent axillary bifemoral bypass 01/29/2012. with fempop BPG and thrombectomy 01/10/13. 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 DC to CIR and returned to acute with open wound at left groin. VAC placed 9/25     Subjective Data      Cognition  Cognition Arousal/Alertness: Awake/alert Behavior During Therapy: WFL for tasks assessed/performed Overall Cognitive Status: Within Functional Limits for tasks assessed    Balance     End of Session PT - End of Session Activity Tolerance: Patient limited by fatigue Patient left: in chair;with call bell/phone within reach;with family/visitor present   GP     Sol Blazing 02/03/2013, 3:58 PM

## 2013-02-03 NOTE — Progress Notes (Signed)
I have reviewed this note and agree with all findings. Kati Patrici Minnis, PT, DPT Pager: [phone]   

## 2013-02-03 NOTE — Progress Notes (Addendum)
  VASCULAR SURGERY PROGRESS NOTE   SUBJECTIVE: pt doing well. Day 14 of antibiotics. Wound vac change tomorrow C/O some diarrhea this am  PHYSICAL EXAM: BP Readings from Last 3 Encounters:  02/03/13 125/48  01/21/13 113/70  01/13/13 141/67   Temp Readings from Last 3 Encounters:  02/03/13 98.1 F (36.7 C) Oral  01/21/13 97.5 F (36.4 C) Oral  01/13/13 97.6 F (36.4 C) Axillary   Pulse Readings from Last 3 Encounters:  02/03/13 68  01/21/13 78  01/13/13 93   SpO2 Readings from Last 3 Encounters:  02/03/13 93%  01/21/13 100%  01/13/13 97%     Intake/Output Summary (Last 24 hours) at 02/03/13 0914 Last data filed at 02/03/13 0647  Gross per 24 hour  Intake    660 ml  Output    800 ml  Net   -140 ml    Extremities: warm Wound vac in place to left groin   ASSESSMENT: left groin open wound with vac Occasional diarrhea- on probiotics.  PLAN:  Ambulate  Wound Management: wound vac change with culture tomorrow Stop Vanc/Zosyn today  DVT prophylaxis: lovenox   Feet pink and warm doppler signals thigh drainage less but persistant lower leg incisions healed Will reculture wound tomorrow with VAC change Consider d/c if there is some graft coverage  Fabienne Bruns, MD [facility] [place] Office: [phone] Pager: [phone]

## 2013-02-04 ENCOUNTER — Encounter: Payer: Self-pay | Admitting: Vascular Surgery

## 2013-02-04 LAB — GLUCOSE, CAPILLARY
Glucose-Capillary: 118 mg/dL — ABNORMAL HIGH (ref 70–99)
Glucose-Capillary: 135 mg/dL — ABNORMAL HIGH (ref 70–99)
Glucose-Capillary: 128 mg/dL — ABNORMAL HIGH (ref 70–99)
Glucose-Capillary: 134 mg/dL — ABNORMAL HIGH (ref 70–99)

## 2013-02-04 NOTE — Progress Notes (Signed)
[facility] [place]  Subjective  - wants to go home   Objective 130/46 72 97.9 F (36.6 C) (Oral) 18 99%  Intake/Output Summary (Last 24 hours) at 02/04/13 1231 Last data filed at 02/04/13 1207  Gross per 24 hour  Intake   1380 ml  Output   1125 ml  Net    255 ml   Left groin wound clean less graft exposed Culture taken from wound  Assessment/Planning: Poorly healing left groin wound making slow progress with VAC Will take down VAC again on Friday home when graft is covered Follow up culture and tailor antibiotics  Donald [name] 02/04/2013 12:31 PM --  Laboratory Lab Results: No results found for this basename: WBC, HGB, HCT, PLT,  in the last 72 hours BMET  Recent Labs  02/02/13 0025  NA 136  K 4.3  CL 98  CO2 26  GLUCOSE 128*  BUN 15  CREATININE 1.10  CALCIUM 8.5    COAG Lab Results  Component Value Date   INR 1.06 01/02/2013   INR 1.05 01/24/2012   INR 0.98 01/23/2012   No results found for this basename: PTT

## 2013-02-04 NOTE — Progress Notes (Signed)
Wet-to-dry dressing to left groin while awaiting VAC replacement.  Will con't plan of care.

## 2013-02-05 ENCOUNTER — Encounter: Payer: Medicare Other | Admitting: Vascular Surgery

## 2013-02-05 LAB — GLUCOSE, CAPILLARY
Glucose-Capillary: 110 mg/dL — ABNORMAL HIGH (ref 70–99)
Glucose-Capillary: 172 mg/dL — ABNORMAL HIGH (ref 70–99)
Glucose-Capillary: 143 mg/dL — ABNORMAL HIGH (ref 70–99)
Glucose-Capillary: 142 mg/dL — ABNORMAL HIGH (ref 70–99)

## 2013-02-05 NOTE — Progress Notes (Signed)
Patient ambulated with wife in hallway earlier this am.  Patient ambulated in hallway 150 feet with RN cane and gait belt. Gait slightly unsteady. Patient back in room call bell within reach. Dalma Panchal, [name] An  rN

## 2013-02-05 NOTE — Progress Notes (Signed)
Patient given Imodium per request. Will continue to monitor patient. Senia Even, [name] An RN

## 2013-02-05 NOTE — Progress Notes (Signed)
PT Cancellation Note  Patient Details Name: Donald [name] MRN: [id number] DOB: 1945/08/19   Cancelled Treatment:    Reason Eval/Treat Not Completed: Patient declined, no reason specified Pt states not ready for therapy at this time, wants to get bathed and dress first.  Will check back as schedule permits.   Bradley Bostelman,KATHrine E 02/05/2013, 1:07 PM Zenovia Jarred, PT, DPT 02/05/2013 Pager: [phone]

## 2013-02-05 NOTE — Progress Notes (Addendum)
Nursing Note 02/05/13 Patient very frustrated today, stating he has been in the hospital a month, he wants to be able to do "more for himself" such as going to the bathroom, or going to sink. Spoke with Dr Darrick Penna and Case management.  Case management spoke with  home health agency, [place] to put patient on Home VAC and to use hospital supplies, so that he could have a little more independence. Placed patient on homeVAC. Patient happy with decision, will continue to monitor patient. Makana Feigel, [name] An rN

## 2013-02-05 NOTE — Progress Notes (Signed)
Patient ambulated in hallway x1 assist with cane approximately 150 feet. Back in room in chair call bell with in reach. Will continue to monitor patient. Donald [name], [name] An  rN

## 2013-02-05 NOTE — Progress Notes (Signed)
   [facility]  Subjective  - Frustrated with stooling in bed.   Objective 114/41 78 97.9 F (36.6 C) (Oral) 18 97%  Intake/Output Summary (Last 24 hours) at 02/05/13 0857 Last data filed at 02/05/13 0438  Gross per 24 hour  Intake    480 ml  Output   2325 ml  Net  -1845 ml   Feet pink warm well perfused  VAC on left groin, clear drainage  Culture from yesterday so far negative  Assessment/Planning: Recheck groin tomorrow home when graft covered Spoke with nurses about rigging VAC so pt can safely get to toilet without assistance  Charly Hunton E 02/05/2013 8:57 AM --  Laboratory Lab Results: No results found for this basename: WBC, HGB, HCT, PLT,  in the last 72 hours BMET No results found for this basename: NA, K, CL, CO2, GLUCOSE, BUN, CREATININE, CALCIUM,  in the last 72 hours  COAG Lab Results  Component Value Date   INR 1.06 01/02/2013   INR 1.05 01/24/2012   INR 0.98 01/23/2012   No results found for this basename: PTT

## 2013-02-06 MED ORDER — HEPARIN SOD (PORK) LOCK FLUSH 100 UNIT/ML IV SOLN
500.0000 [IU] | INTRAVENOUS | Status: AC | PRN
  Administered 2013-02-06: 500 [IU]

## 2013-02-06 MED ORDER — OXYCODONE HCL 5 MG PO TABS: 5.0000 mg | ORAL_TABLET | ORAL | Status: DC | PRN

## 2013-02-06 NOTE — Progress Notes (Signed)
[facility]  Subjective  - Doing much better with home vac setup.  I have walked more and am able to go to the bathroom.   Objective 102/54 77 98.6 F (37 C) (Oral) 20 99%  Intake/Output Summary (Last 24 hours) at 02/06/13 1332 Last data filed at 02/06/13 1318  Gross per 24 hour  Intake    720 ml  Output    350 ml  Net    370 ml    Wound vac removed.  Left groin clean and dry.  Proximal wound without active drainage. Distally feet warm to touch  Assessment/Planning: Cultures negative no home antibiotics needed D/C home with wound vac/ white foam wick F/U in the office with Dr. Darrick Penna next Thursday    [place], [place] [facility] 02/06/2013 1:32 PM --  Laboratory Lab Results: No results found for this basename: WBC, HGB, HCT, PLT,  in the last 72 hours BMET No results found for this basename: NA, K, CL, CO2, GLUCOSE, BUN, CREATININE, CALCIUM,  in the last 72 hours  COAG Lab Results  Component Value Date   INR 1.06 01/02/2013   INR 1.05 01/24/2012   INR 0.98 01/23/2012   No results found for this basename: PTT

## 2013-02-06 NOTE — Progress Notes (Signed)
Left groin wound slowly healing over, clean, graft no longer exposed.  Will d/c home today.  Follow up with me in 1 week  Fabienne Bruns, MD [facility] [place] Office: [phone] Pager: [phone]

## 2013-02-06 NOTE — Discharge Summary (Signed)
[facility] Discharge Summary   Patient ID:  NERO SAWATZKY MRN: [id number] DOB/AGE: 69-Jul-1947 69 y.o.  Admit date: 01/21/2013 Discharge date: 02/06/2013  Surgeon: Dr. Fabienne Bruns  Admission Diagnosis: Failed L fem-pop with vein s/p L fem-pop with Propaten complicated with wound infection treated with debridement and VAC placement  Discharge Diagnoses:  Failed L fem-pop with vein s/p L fem-pop with Propaten complicated with wound infection treated with debridement and VAC placement  Secondary Diagnoses: Past Medical History  Diagnosis Date  . GERD (gastroesophageal reflux disease)   . Hyperlipidemia   . Hypertension   . Peripheral vascular disease   . Internal and external bleeding hemorrhoids   . History of gout     left great toe  . Depression   . Numbness in right leg     Hx: of diabetes  . Coronary artery disease     RCA occlusion with good collaterals, o/w no obs CAD 12/2011  . Pneumonia ~ 1977  . Type II diabetes mellitus   . History of blood transfusion 12/2011; 12/2012    "before OR; after OR" (01/22/2013)  . Stroke 2008    "left side weak; unable to move left hand still" (01/22/2013)  . Rectal cancer dx'd 08/2007    S/P chemo, radiation, biopsies  . Metastases to the liver dx'd 08/2010      Discharged Condition: good  HPI:  BENTZION DAURIA is a 67 y.o. male who underwent axillary bifemoral bypass 01/29/2012. His wife report a new blister on the tip of his left great toe due to new shoe ware that is healing fine and a superficial scrape on the lateral malleolus. He returns today for followup after recent CT angiogram with runoff. He thinks that the toes have gotten worse. He still has pain in the left foot. He is fairly debilitated overall from prior stroke. He is able to transfer but is not able to ambulate much due to left-sided weakness.  He now has a limb threatening situation with ulcerations and blistering of his left first and second  toe with decreased ABIs. He has evidence of common femoral artery occlusive disease below the anastomosis. He also has a proximal SFA occlusion. He also has popliteal artery occlusive disease. Apparently there is three-vessel runoff to the right foot. I believe the best option would be a left femoral to below-knee popliteal bypass with vein if this is available conduit. If not we will use prosthetic. We'll assess the adequacy of his inflow on the table. If the inflow was found to be poor then we would possibly also revise the proximal anastomosis as well. Most likely I will only address the outflow procedure in this setting. We would consider an arteriogram to evaluate the inflow later on if it is not adequate.  VIRAAJ VORNDRAN is a 67 y.o. male is S/P  Left femoral to below knee popliteal artery bypass with non reversed greater saphenous vein, patch angioplasty left common femoral artery, intraop agram  Pt had a CTA of arch vessels on 01/09/13 and found to have irregular nonocclusive  thrombus along the posterior wall of the common femoral artery in  the region of the patchy angioplasty and the saphenous vein bypass  graft is completely occluded approximately 1 cm beyond the  Anastomosis.  Pt was taken to OR on 01/10/13 for  Procedure(s) #1 attempted thrombectomy left femoral-popliteal saphenous vein graft-unsuccessful  #2 insertion of new left common femoral to popliteal (below knee) bypass using a  6 mm Gore-Tex-propaten graft #3 intraoperative arteriogram left leg  Post-operatively pt had DP/PT flow to foot  Pt began to have drainage from left groin wound on 01/20/2013 - culture shows mult organisms.  Wound opened- large amt of serous fluid expressed-  Pt started on Vanc and Zosyn. Will readmit to floor from rehab for wound care and IV antibiotics.  17 days on Zosyn and Vancomycin.  New cultures collected 02/04/2013 show no organisms to date.   His diarrhea has subsided since yesterday.  He will be  discharged home with wound vac in place and follow up in the office in 1 week.     Hospital Course:  TRAYDEN BRANDY is a 67 y.o. male is S/P Left groin wound infection   Extubated: POD # 0 Physical exam: left groin is clean with out hematoma or active drainage. Pt. Ambulating, voiding and taking PO diet without difficulty. Pt pain controlled with PO pain meds. Labs as below Complications:see hospital course  Consults:     Significant Diagnostic Studies: CBC Lab Results  Component Value Date   WBC 4.5 01/26/2013   HGB 8.9* 01/26/2013   HCT 28.2* 01/26/2013   MCV 86.2 01/26/2013   PLT 408* 01/26/2013    BMET    Component Value Date/Time   NA 136 02/02/2013 0025   NA 138 12/01/2012 1008   NA 147* 10/12/2011 0959   K 4.3 02/02/2013 0025   K 5.0 12/01/2012 1008   K 5.2* 10/12/2011 0959   CL 98 02/02/2013 0025   CL 102 05/19/2012 1427   CL 102 10/12/2011 0959   CO2 26 02/02/2013 0025   CO2 23 12/01/2012 1008   CO2 29 10/12/2011 0959   GLUCOSE 128* 02/02/2013 0025   GLUCOSE 195* 12/01/2012 1008   GLUCOSE 205* 05/19/2012 1427   GLUCOSE 244* 10/12/2011 0959   BUN 15 02/02/2013 0025   BUN 13.9 12/01/2012 1008   BUN 13 10/12/2011 0959   CREATININE 1.10 02/02/2013 0025   CREATININE 1.4* 12/01/2012 1008   CREATININE 1.0 10/12/2011 0959   CALCIUM 8.5 02/02/2013 0025   CALCIUM 9.9 12/01/2012 1008   CALCIUM 9.0 10/12/2011 0959   GFRNONAA 67* 02/02/2013 0025   GFRAA 77* 02/02/2013 0025   COAG Lab Results  Component Value Date   INR 1.06 01/02/2013   INR 1.05 01/24/2012   INR 0.98 01/23/2012     Disposition:  Discharge to :Home Discharge Orders   Future Appointments Provider Department Dept Phone   03/09/2013 1:30 PM [facility] Nurse [facility] [phone]   05/04/2013 1:30 PM [facility] Nurse [facility] [phone]   05/15/2013 2:30 PM Nilda Riggs, NP [facility] [phone]   06/05/2013 10:30 AM  [facility] [phone]   06/05/2013 11:00 AM Wl-Dg 4 (Chest) [facility] [phone]   06/05/2013 11:30 AM Wl-Ct 2 [facility] [phone]   Patient to arrive 15 minutes prior to appointment time. Patient to pick up oral contrast at least 1 day prior to exam, unless otherwise instructed by your physician. No solid food 4 hours prior to exam. Liquids and Medicines are okay.   06/09/2013 3:00 PM Benjiman Core, MD [facility] MEDICAL ONCOLOGY [phone]   06/29/2013 1:30 PM [facility] Nurse [place] [facility] [phone]   Future Orders Complete By Expires   Call MD for:  redness, tenderness, or  signs of infection (pain, swelling, bleeding, redness, odor or green/yellow discharge around incision site)  As directed    Call MD for:  severe or increased pain, loss or decreased feeling  in affected limb(s)  As directed    Call MD for:  temperature >100.5  As directed    Discharge instructions  As directed    Comments:     Wound vac changes 3 times per week.   Resume previous diet  As directed        Medication List         acidophilus Caps capsule  Take 1 capsule by mouth daily.     ALIGN PO  Take 1 tablet by mouth at bedtime.     buPROPion 150 MG 12 hr tablet  Commonly known as:  ZYBAN  Take 150 mg by mouth daily.     dipyridamole-aspirin 200-25 MG per 12 hr capsule  Commonly known as:  AGGRENOX  Take 1 capsule by mouth 2 (two) times daily.     docusate sodium 100 MG capsule  Commonly known as:  COLACE  Take 200 mg by mouth 2 (two) times daily.     gabapentin 300 MG capsule  Commonly known as:  NEURONTIN  Take 300 mg by mouth 3 (three) times daily.     lisinopril 5 MG tablet  Commonly known as:  PRINIVIL,ZESTRIL  Take 0.5 tablets (2.5 mg total) by mouth daily.     metFORMIN 500 MG (MOD) 24 hr tablet  Commonly  known as:  GLUMETZA  Take 500 mg by mouth 2 (two) times daily with a meal.     mupirocin ointment 2 %  Commonly known as:  BACTROBAN  1 Application, nasally, 2 times daily for 5 days prior to surgery     omega-3 acid ethyl esters 1 G capsule  Commonly known as:  LOVAZA  Take 2 g by mouth 2 (two) times daily.     omeprazole 40 MG capsule  Commonly known as:  PRILOSEC  Take 40 mg by mouth daily.     oxyCODONE 5 MG immediate release tablet  Commonly known as:  Oxy IR/ROXICODONE  Take 1 tablet (5 mg total) by mouth every 4 (four) hours as needed for pain.     vitamin C 1000 MG tablet  Take 1,000 mg by mouth daily.       Verbal and written Discharge instructions given to the patient. Wound care per Discharge AVS     Follow-up Information   Follow up with Sherren Kerns, MD In 1 week. (sent)    Specialty:  Vascular Surgery   Contact information:   [address] [place] [address] [phone]       Signed: Clinton Gallant [facility] 02/06/2013, 1:37 PM

## 2013-02-06 NOTE — Progress Notes (Signed)
Pt/family given discharge instructions, medication lists, follow up appointments, and when to call the doctor.  Pt/family verbalizes understanding. Pt given signs and symptoms of infection. Pt and wife given instructions for vac change to be done by home health RN.  All questions answered. Donald [name]

## 2013-02-07 LAB — WOUND CULTURE
Culture: NO GROWTH
Gram Stain: NONE SEEN

## 2013-02-09 ENCOUNTER — Telehealth: Payer: Self-pay | Admitting: Vascular Surgery

## 2013-02-09 NOTE — Telephone Encounter (Addendum)
Message copied by Fredrich Birks on Mon Feb 09, 2013  1:47 PM ------      Message from: Phillips Odor      Created: Fri Feb 06, 2013  3:48 PM      Regarding: APPT W/ CEF NEXT WK                   ----- Message -----         From: Lars Mage, PA-C         Sent: 02/06/2013   1:26 PM           To: Melene Plan, RN            F/U next week wound check/ discharged with wound vac.  Dr. Darrick Penna ------  02/09/13: spoke with pts wife to make aware of 02/12/13 11:30am appt, dpm

## 2013-02-10 ENCOUNTER — Telehealth: Payer: Self-pay | Admitting: *Deleted

## 2013-02-10 NOTE — Telephone Encounter (Signed)
Myrene Buddy, Home health nurse for Donald [name] called wanting to verify the setting of the wound vac. It had been set at 100 upon discharge. Also she was inquiring if they could have an order for a PT and OT evaluation.  I called Della Goo PA and she states that 100 pressure was fine and that a PT and OT evaluation would be in order as well.

## 2013-02-11 ENCOUNTER — Encounter: Payer: Self-pay | Admitting: Vascular Surgery

## 2013-02-12 ENCOUNTER — Encounter: Payer: Self-pay | Admitting: Vascular Surgery

## 2013-02-12 ENCOUNTER — Ambulatory Visit (INDEPENDENT_AMBULATORY_CARE_PROVIDER_SITE_OTHER): Payer: Self-pay | Admitting: Vascular Surgery

## 2013-02-12 VITALS — BP 141/72 | HR 88 | Temp 98.2°F | Ht 68.9 in | Wt 180.6 lb

## 2013-02-12 DIAGNOSIS — I739 Peripheral vascular disease, unspecified: Secondary | ICD-10-CM

## 2013-02-12 NOTE — Progress Notes (Signed)
Patient is a 67 year old male who returns for followup today. He recently had a left femoral to below-knee popliteal bypass performed with PTFE. He has a pre-existing axillary bifemoral bypass. He had breakdown of his left groin wound. He is currently undergoing VAC therapy of this. He has had no bleeding episodes. He has had no fever or chills. He has also had some intermittent drainage from his left medial thigh.  Physical exam:  Filed Vitals:   02/12/13 1157  BP: 141/72  Pulse: 88  Temp: 98.2 F (36.8 C)  TempSrc: Oral  Height: 5' 8.9" (1.75 m)  Weight: 180 lb 9.6 oz (81.92 kg)  SpO2: 99%    Left groin healthy-appearing granulation tissue wound 1-1/2 cm circumference 1/2-2 cm depth with the anterior surface of the graft exposed at the depths of the wound  Left medial thigh 2 cm opening large amount of serous fluid expressed from this today no erythema  Left foot warm pink well-perfused  Assessment: Poorly healing left groin wound still at risk for graft infection at this point we will switch to hydrogel on the left groin and place a VAC on the left medial thigh  Plan: Wound care as above followup one week  Fabienne Bruns, MD [facility] [place] Office: [phone] Pager: [phone]

## 2013-02-18 ENCOUNTER — Encounter: Payer: Self-pay | Admitting: Vascular Surgery

## 2013-02-19 ENCOUNTER — Encounter: Payer: Self-pay | Admitting: Vascular Surgery

## 2013-02-19 ENCOUNTER — Ambulatory Visit (INDEPENDENT_AMBULATORY_CARE_PROVIDER_SITE_OTHER): Payer: Self-pay | Admitting: Vascular Surgery

## 2013-02-19 VITALS — BP 119/72 | HR 83 | Ht 68.9 in | Wt 181.7 lb

## 2013-02-19 DIAGNOSIS — I739 Peripheral vascular disease, unspecified: Secondary | ICD-10-CM

## 2013-02-19 MED ORDER — OXYCODONE HCL 5 MG PO TABS: 5.0000 mg | ORAL_TABLET | Freq: Four times a day (QID) | ORAL | Status: DC | PRN

## 2013-02-19 NOTE — Progress Notes (Signed)
Patient is a 67 year old male who returns for followup today. He recently had a left femoral to below-knee popliteal bypass performed with PTFE 01/10/13. He has a pre-existing axillary bifemoral bypass. He had breakdown of his left groin wound. He is doing hydrogel dressings of this. He has also had intermittent drainage from his left medial thigh and has a VAC on this. He has had no bleeding episodes. He has had no fever or chills.  Physical exam:    Physical exam:  Filed Vitals:   02/19/13 1120  BP: 119/72  Pulse: 83  Height: 5' 8.9" (1.75 m)  Weight: 181 lb 11.2 oz (82.419 kg)  SpO2: 98%    Left groin healthy-appearing granulation tissue wound 1-1/2 cm circumference 1/2-2 cm depth with the anterior surface of the graft not seen today at the depths of the wound  Left medial thigh 2 cm opening amount of serous fluid expressed from this today no erythema  Left foot warm pink well-perfused, tip of toe with gangrene dry now  Assessment: Poorly healing left groin wound still at risk for graft infection continued hydrogel on the left groin and place a VAC on the left medial thigh  Plan: Wound care as above followup one week  Fabienne Bruns, MD [facility] [place] Office: [phone] Pager: [phone]

## 2013-02-25 ENCOUNTER — Encounter: Payer: Self-pay | Admitting: Vascular Surgery

## 2013-02-26 ENCOUNTER — Encounter: Payer: Self-pay | Admitting: Vascular Surgery

## 2013-02-26 ENCOUNTER — Ambulatory Visit (INDEPENDENT_AMBULATORY_CARE_PROVIDER_SITE_OTHER): Payer: Self-pay | Admitting: Vascular Surgery

## 2013-02-26 ENCOUNTER — Encounter: Payer: Medicare Other | Admitting: Vascular Surgery

## 2013-02-26 VITALS — BP 139/56 | HR 76 | Resp 18 | Ht 69.0 in | Wt 181.0 lb

## 2013-02-26 DIAGNOSIS — I739 Peripheral vascular disease, unspecified: Secondary | ICD-10-CM

## 2013-02-26 NOTE — Progress Notes (Signed)
[facility] POST OPERATIVE OFFICE NOTE  CC:  F/u for surgery  HPI:  This is a 67 y.o. male who is s/p left femoral to popliteal bypass grafting with PTFE on 01/10/13.  He has had prior axbifem. The most recent operation was done for a nonhealing wound of his left first toe. He had breakdown of his left groin and has been using hydrogel.  There has been a wound vac on the left mid thigh incision.  He states he has been doing well.  His wife states there has been a little bit of drainage from the ulcer on his left great toe.  She also states there is a new ulcer on the right big toe.  Allergies  Allergen Reactions  . Other     Became hypotensive. Pt states allergic to "muscle relaxer" but unsure of name.  . Tape Hives and Itching    medfix non woven tape    Current Outpatient Prescriptions  Medication Sig Dispense Refill  . acidophilus (RISAQUAD) CAPS Take 1 capsule by mouth daily.       . Ascorbic Acid (VITAMIN C) 1000 MG tablet Take 1,000 mg by mouth daily.      Marland Kitchen buPROPion (ZYBAN) 150 MG 12 hr tablet Take 150 mg by mouth daily.       Marland Kitchen dipyridamole-aspirin (AGGRENOX) 200-25 MG per 12 hr capsule Take 1 capsule by mouth 2 (two) times daily.  180 capsule  1  . docusate sodium (COLACE) 100 MG capsule Take 200 mg by mouth 2 (two) times daily.      Marland Kitchen gabapentin (NEURONTIN) 300 MG capsule Take 300 mg by mouth 3 (three) times daily.      Marland Kitchen lisinopril (PRINIVIL,ZESTRIL) 5 MG tablet Take 0.5 tablets (2.5 mg total) by mouth daily.      . metFORMIN (GLUMETZA) 500 MG (MOD) 24 hr tablet Take 500 mg by mouth 2 (two) times daily with a meal.       . mupirocin ointment (BACTROBAN) 2 % 1 Application, nasally, 2 times daily for 5 days prior to surgery  22 g  0  . omega-3 acid ethyl esters (LOVAZA) 1 G capsule Take 2 g by mouth 2 (two) times daily.       Marland Kitchen omeprazole (PRILOSEC) 40 MG capsule Take 40 mg by mouth daily.      Marland Kitchen oxyCODONE (OXY IR/ROXICODONE) 5 MG immediate release tablet Take 1  tablet (5 mg total) by mouth every 4 (four) hours as needed for pain.  30 tablet  0  . oxyCODONE (OXY IR/ROXICODONE) 5 MG immediate release tablet Take 1 tablet (5 mg total) by mouth every 6 (six) hours as needed for pain.  30 tablet  0  . Probiotic Product (ALIGN PO) Take 1 tablet by mouth at bedtime.       No current facility-administered medications for this visit.     ROS:  See HPI  Physical Exam:  Filed Vitals:   02/26/13 1437  BP: 139/56  Pulse: 76  Resp: 18    Incision:  Left groin healed with small open area mid incision.  There is small 1 cm segment of the anterior wall the graft exposed.  Left medial mid thigh incision is healing nicely with minimal drainage. Extremities:  Mild drainage from left great toe which is gangrenous at the tip.  There is a small area of ulceration on his right great toe 2 mm dark color.   A/P:  This is a 67 y.o.  male here for wound check.  -continue hydrogel to left groin wound. -will discontinue vac on left thigh incision. -will have pt return in 3 weeks for a wound check. -dry dressing to left great toe -pt will contact us if wounds worsen   Doreatha Massed, PA-C [facility] [phone]  Clinic MD:  Pt seen and examined with Dr. Darrick Penna  History and exam as above. Patient will followup in 3 weeks. He is still a very high risk for graft infection and bilateral limb loss.  Fabienne Bruns, MD [facility] [place] Office: [phone] Pager: [phone]

## 2013-03-06 ENCOUNTER — Encounter (HOSPITAL_COMMUNITY): Payer: Self-pay | Admitting: Emergency Medicine

## 2013-03-06 ENCOUNTER — Emergency Department (HOSPITAL_COMMUNITY)
Admission: EM | Admit: 2013-03-06 | Discharge: 2013-03-06 | Disposition: A | Discharge status: Home / Self Care | Payer: Medicare Other | Attending: Emergency Medicine | Admitting: Emergency Medicine

## 2013-03-06 ENCOUNTER — Telehealth: Payer: Self-pay | Admitting: *Deleted

## 2013-03-06 DIAGNOSIS — I10 Essential (primary) hypertension: Secondary | ICD-10-CM | POA: Insufficient documentation

## 2013-03-06 DIAGNOSIS — IMO0002 Reserved for concepts with insufficient information to code with codable children: Secondary | ICD-10-CM | POA: Insufficient documentation

## 2013-03-06 DIAGNOSIS — Z8701 Personal history of pneumonia (recurrent): Secondary | ICD-10-CM | POA: Insufficient documentation

## 2013-03-06 DIAGNOSIS — I2510 Atherosclerotic heart disease of native coronary artery without angina pectoris: Secondary | ICD-10-CM | POA: Insufficient documentation

## 2013-03-06 DIAGNOSIS — Z923 Personal history of irradiation: Secondary | ICD-10-CM | POA: Insufficient documentation

## 2013-03-06 DIAGNOSIS — Z85048 Personal history of other malignant neoplasm of rectum, rectosigmoid junction, and anus: Secondary | ICD-10-CM | POA: Insufficient documentation

## 2013-03-06 DIAGNOSIS — Z8505 Personal history of malignant neoplasm of liver: Secondary | ICD-10-CM | POA: Insufficient documentation

## 2013-03-06 DIAGNOSIS — F3289 Other specified depressive episodes: Secondary | ICD-10-CM | POA: Insufficient documentation

## 2013-03-06 DIAGNOSIS — Z79899 Other long term (current) drug therapy: Secondary | ICD-10-CM | POA: Insufficient documentation

## 2013-03-06 DIAGNOSIS — Z8673 Personal history of transient ischemic attack (TIA), and cerebral infarction without residual deficits: Secondary | ICD-10-CM | POA: Insufficient documentation

## 2013-03-06 DIAGNOSIS — K219 Gastro-esophageal reflux disease without esophagitis: Secondary | ICD-10-CM | POA: Insufficient documentation

## 2013-03-06 DIAGNOSIS — Z87891 Personal history of nicotine dependence: Secondary | ICD-10-CM | POA: Insufficient documentation

## 2013-03-06 DIAGNOSIS — Y838 Other surgical procedures as the cause of abnormal reaction of the patient, or of later complication, without mention of misadventure at the time of the procedure: Secondary | ICD-10-CM | POA: Insufficient documentation

## 2013-03-06 DIAGNOSIS — E785 Hyperlipidemia, unspecified: Secondary | ICD-10-CM | POA: Insufficient documentation

## 2013-03-06 DIAGNOSIS — E119 Type 2 diabetes mellitus without complications: Secondary | ICD-10-CM | POA: Insufficient documentation

## 2013-03-06 DIAGNOSIS — F329 Major depressive disorder, single episode, unspecified: Secondary | ICD-10-CM | POA: Insufficient documentation

## 2013-03-06 DIAGNOSIS — T888XXA Other specified complications of surgical and medical care, not elsewhere classified, initial encounter: Secondary | ICD-10-CM

## 2013-03-06 LAB — CBC WITH DIFFERENTIAL/PLATELET
Basophils Absolute: 0 10*3/uL (ref 0.0–0.1)
Basophils Relative: 0 % (ref 0–1)
HCT: 29.9 % — ABNORMAL LOW (ref 39.0–52.0)
Hemoglobin: 9.3 g/dL — ABNORMAL LOW (ref 13.0–17.0)
Lymphocytes Relative: 21 % (ref 12–46)
Lymphs Abs: 1.3 10*3/uL (ref 0.7–4.0)
MCH: 25.5 pg — ABNORMAL LOW (ref 26.0–34.0)
MCHC: 31.1 g/dL (ref 30.0–36.0)
Monocytes Absolute: 0.7 10*3/uL (ref 0.1–1.0)
Monocytes Relative: 12 % (ref 3–12)
Neutro Abs: 4.2 10*3/uL (ref 1.7–7.7)
Neutrophils Relative %: 65 % (ref 43–77)
WBC: 6.4 10*3/uL (ref 4.0–10.5)

## 2013-03-06 LAB — COMPREHENSIVE METABOLIC PANEL
AST: 32 U/L (ref 0–37)
Albumin: 3 g/dL — ABNORMAL LOW (ref 3.5–5.2)
Alkaline Phosphatase: 99 U/L (ref 39–117)
BUN: 13 mg/dL (ref 6–23)
CO2: 24 mEq/L (ref 19–32)
Chloride: 102 mEq/L (ref 96–112)
GFR calc non Af Amer: 61 mL/min — ABNORMAL LOW (ref >90)
Potassium: 4.5 mEq/L (ref 3.5–5.1)
Total Bilirubin: 0.1 mg/dL — ABNORMAL LOW (ref 0.3–1.2)

## 2013-03-06 NOTE — ED Notes (Signed)
Lab called, spoke with sheila in regards to cmp results on pt. Lab reports that they will run the labs.

## 2013-03-06 NOTE — ED Notes (Signed)
Lab called to follow up on pt labs, lab confirmed they just received labs.

## 2013-03-06 NOTE — Telephone Encounter (Signed)
Darel Hong stated that Donald [name]'s small opening in his groin incision had been closing up nicely but last night started bleeding. She has changed the dressing several times but this one was soaked in less than 2 hours. I suggested she go to the ER to assess what is happening. She verbalized OK.

## 2013-03-06 NOTE — ED Provider Notes (Signed)
CSN: [id number]     Arrival date & time 03/06/13  1516 History   First MD Initiated Contact with Patient 03/06/13 1613     Chief Complaint  Patient presents with  . surgical site bleeding    (Consider location/radiation/quality/duration/timing/severity/associated sxs/prior Treatment) HPI Comments: 67 yo male c/o bleeding from surgical site on L groin area.  Pt had Axillary -Femor bipass graft 01/29/12.  Most recently femoral-popliteal bypass graft on 01/06/13 by Dr. Darrick Penna and Femoral-Popliteal bypass thrombectomy/graft on 01/10/13.  D/C from hospital 02/06/13.      Today home health checked site and was concerned with the amount of fluid in groin.  Wife called office and was advised to report to ER for evaluation.  Dr. Cecelia Byars is on call today.    Pt denies cold extremity, inability to move LLE, blue toes or LLE.  No change in PO intake, BM, or void.  Pt is walking with a cane.    Pt denies f/c, cough, CP, n/v/d, abd pain, f/u/d.  He is ambulating at his baseline with a cane.    The history is provided by the patient.    Past Medical History  Diagnosis Date  . GERD (gastroesophageal reflux disease)   . Hyperlipidemia   . Hypertension   . Peripheral vascular disease   . Internal and external bleeding hemorrhoids   . History of gout     left great toe  . Depression   . Numbness in right leg     Hx: of diabetes  . Coronary artery disease     RCA occlusion with good collaterals, o/w no obs CAD 12/2011  . Pneumonia ~ 1977  . Type II diabetes mellitus   . History of blood transfusion 12/2011; 12/2012    "before OR; after OR" (01/22/2013)  . Stroke 2008    "left side weak; unable to move left hand still" (01/22/2013)  . Rectal cancer dx'd 08/2007    S/P chemo, radiation, biopsies  . Metastases to the liver dx'd 08/2010   Past Surgical History  Procedure Laterality Date  . Carotid endarterectomy Right 2008  . Liver cryoablation  08/2010    "chemo shrunk the cancer 50% then they went in and  burned the rest of it"  . Axillary-femoral bypass graft  01/29/2012    Procedure: BYPASS GRAFT AXILLA-BIFEMORAL;  Surgeon: Sherren Kerns, MD;  Location: [facility] OR;  Service: Vascular;  Laterality: N/A;  Left axilla-bifemoral bypass using gore-tex graft.   . Colonoscopy w/ biopsies and polypectomy      Hx: of  . Femoral-popliteal bypass graft Left 01/06/2013    Procedure: Femoral-Popliteal Artery Bypass;  Surgeon: Sherren Kerns, MD;  Location: [facility] OR;  Service: Vascular;  Laterality: Left;  . Intraoperative arteriogram Left 01/06/2013    Procedure: INTRA OPERATIVE ARTERIOGRAM;  Surgeon: Sherren Kerns, MD;  Location: [facility] OR;  Service: Vascular;  Laterality: Left;  . Femoral-popliteal bypass graft Left 01/10/2013    Procedure: THROMBECTOMY POSSIBLE BYPASS GRAFT FEMORAL-POPLITEAL ARTERY;  Surgeon: Pryor Ochoa, MD;  Location: [facility] OR;  Service: Vascular;  Laterality: Left;  Attempted Thrombectomy of left Femoral/Popliteal graft - Unsuccessful.  Insertion of Left femoral to below the knee popliteal propaten graft.  . Mass biopsy  2009    "found mass that was cancer; later had chemo and radiation; never cut mass out" (01/22/2013)   Family History  Problem Relation Age of Onset  . Cancer - Other Sister    History  Substance Use Topics  .  Smoking status: Former Smoker -- 0.10 packs/day for 57 years    Types: Cigarettes  . Smokeless tobacco: Never Used     Comment: pt states that he has only smoked 1/2 cig since 02/06/2013  . Alcohol Use: No    Review of Systems  Constitutional: Negative.  Negative for fatigue.  HENT: Negative.   Eyes: Negative.   Respiratory: Negative.   Cardiovascular: Negative.   Gastrointestinal: Negative.   Endocrine: Negative.   Genitourinary: Negative.   Musculoskeletal: Positive for arthralgias and joint swelling.       L groin edema surrounding previous incision site.  Small amount of blood from superior most incision site in L groin; serosanguinous d/c, no purulent  d/c   Neurological: Negative.     Allergies  Other and Tape  Home Medications   Current Outpatient Rx  Name  Route  Sig  Dispense  Refill  . Ascorbic Acid (VITAMIN C) 1000 MG tablet   Oral   Take 1,000 mg by mouth daily.         Marland Kitchen buPROPion (ZYBAN) 150 MG 12 hr tablet   Oral   Take 150 mg by mouth daily.          Marland Kitchen dipyridamole-aspirin (AGGRENOX) 200-25 MG per 12 hr capsule   Oral   Take 1 capsule by mouth 2 (two) times daily.         Marland Kitchen docusate sodium (COLACE) 100 MG capsule   Oral   Take 200 mg by mouth 2 (two) times daily as needed for mild constipation.         . gabapentin (NEURONTIN) 300 MG capsule   Oral   Take 300 mg by mouth 3 (three) times daily.         Marland Kitchen lisinopril (PRINIVIL,ZESTRIL) 5 MG tablet   Oral   Take 2.5 mg by mouth daily.         . metFORMIN (GLUCOPHAGE) 500 MG tablet   Oral   Take 500 mg by mouth 2 (two) times daily with a meal.         . omega-3 acid ethyl esters (LOVAZA) 1 G capsule   Oral   Take 2 g by mouth 2 (two) times daily.          Marland Kitchen omeprazole (PRILOSEC) 40 MG capsule   Oral   Take 40 mg by mouth daily.         Marland Kitchen oxyCODONE (OXY IR/ROXICODONE) 5 MG immediate release tablet   Oral   Take 5 mg by mouth every 4 (four) hours as needed for severe pain.         . Probiotic Product (ALIGN PO)   Oral   Take 1 tablet by mouth at bedtime.          BP 120/77  Pulse 90  Temp(Src) 98.6 F (37 C) (Oral)  Resp 18  Ht 5\' 9"  (1.753 m)  Wt 181 lb (82.101 kg)  BMI 26.72 kg/m2  SpO2 95% Physical Exam  Nursing note and vitals reviewed. Constitutional: He is oriented to person, place, and time. He appears well-developed and well-nourished.  HENT:  Head: Normocephalic and atraumatic.  Cardiovascular: Normal rate and regular rhythm.   Pulmonary/Chest: Effort normal and breath sounds normal.  Abdominal: Soft. Bowel sounds are normal. He exhibits no distension and no mass. There is no tenderness. There is no rebound  and no guarding. Hernia confirmed negative in the right inguinal area and confirmed negative in the left inguinal area.  Incision to L groin area with mild dehiscence and small pea sized opening with minimal serosanguinous d/c.  No surrounding erythema, edema, or warmth.    Genitourinary: Penis normal. Cremasteric reflex is present. Right testis shows swelling. Right testis shows no mass and no tenderness. Left testis shows swelling. Left testis shows no mass and no tenderness. Circumcised. No penile tenderness.  Musculoskeletal: He exhibits no edema and no tenderness.       Legs: LLE - distal n/v/m function intact  Neurological: He is alert and oriented to person, place, and time. He has normal reflexes.    ED Course  Procedures (including critical care time) Labs Review Labs Reviewed  CBC WITH DIFFERENTIAL - Abnormal; Notable for the following:    RBC 3.65 (*)    Hemoglobin 9.3 (*)    HCT 29.9 (*)    MCH 25.5 (*)    All other components within normal limits  COMPREHENSIVE METABOLIC PANEL - Abnormal; Notable for the following:    Glucose, Bld 186 (*)    Albumin 3.0 (*)    Total Bilirubin 0.1 (*)    GFR calc non Af Amer 61 (*)    GFR calc Af Amer 71 (*)    All other components within normal limits   Imaging Review No results found.  EKG Interpretation   None      Results for orders placed during the hospital encounter of 03/06/13  CBC WITH DIFFERENTIAL      Result Value Range   WBC 6.4  4.0 - 10.5 K/uL   RBC 3.65 (*) 4.22 - 5.81 MIL/uL   Hemoglobin 9.3 (*) 13.0 - 17.0 g/dL   HCT 16.1 (*) 09.6 - 04.5 %   MCV 81.9  78.0 - 100.0 fL   MCH 25.5 (*) 26.0 - 34.0 pg   MCHC 31.1  30.0 - 36.0 g/dL   RDW 40.9  81.1 - 91.4 %   Platelets 355  150 - 400 K/uL   Neutrophils Relative % 65  43 - 77 %   Neutro Abs 4.2  1.7 - 7.7 K/uL   Lymphocytes Relative 21  12 - 46 %   Lymphs Abs 1.3  0.7 - 4.0 K/uL   Monocytes Relative 12  3 - 12 %   Monocytes Absolute 0.7  0.1 - 1.0 K/uL    Eosinophils Relative 3  0 - 5 %   Eosinophils Absolute 0.2  0.0 - 0.7 K/uL   Basophils Relative 0  0 - 1 %   Basophils Absolute 0.0  0.0 - 0.1 K/uL  COMPREHENSIVE METABOLIC PANEL      Result Value Range   Sodium 138  135 - 145 mEq/L   Potassium 4.5  3.5 - 5.1 mEq/L   Chloride 102  96 - 112 mEq/L   CO2 24  19 - 32 mEq/L   Glucose, Bld 186 (*) 70 - 99 mg/dL   BUN 13  6 - 23 mg/dL   Creatinine, Ser 7.82  0.50 - 1.35 mg/dL   Calcium 9.1  8.4 - 95.6 mg/dL   Total Protein 7.1  6.0 - 8.3 g/dL   Albumin 3.0 (*) 3.5 - 5.2 g/dL   AST 32  0 - 37 U/L   ALT 20  0 - 53 U/L   Alkaline Phosphatase 99  39 - 117 U/L   Total Bilirubin 0.1 (*) 0.3 - 1.2 mg/dL   GFR calc non Af Amer 61 (*) >90 mL/min   GFR calc Af Amer 71 (*) >  90 mL/min    MDM   1. Post-op bleeding, initial encounter     Pt was evaluated by Dr. Cecelia Byars.  Cleared for d/c home.    11:52 PM Patient's vital signs are stable, his lab work is unremarkable, he has been evaluated by vascular surgery who was comfortable with discharging the patient home with precautions. Followup appointment with vascular surgeon was arranged for Thursday upcoming. Strict ER precautions were given for bleeding from surgical site, fever, left lower extremity findings of pain, pallor, paresthesias, extremity, or other concerns. Patient and his wife are comfortable with this discharge plan.  Doubt SBI, DVT, major bleed, bipass thrombosis or failure, or other emergent issue at this time.     Darlys Gales, MD 03/06/13 2352

## 2013-03-06 NOTE — ED Notes (Signed)
The pt had a procedure in his lt groin x 2 in September.  His groin has been bleeding today and dr Hosie Poisson told the pt to come here and call.  He has had a stroke and has had a lt sided deficit and walks with a cane.

## 2013-03-06 NOTE — ED Notes (Signed)
Vascular at bedside

## 2013-03-06 NOTE — ED Notes (Signed)
MD at bedside. 

## 2013-03-06 NOTE — Consult Note (Signed)
[facility] [place]      Consult Note  Patient name: Donald [name] MRN: [id number] DOB: [birth year] Sex: male  Consulting Physician:  ER  Reason for Consult:  Chief Complaint  Patient presents with  . surgical site bleeding     HISTORY OF PRESENT ILLNESS: The patient is s/p left ax-bifem BPG on 01/29/2012 for ischemic rest pain.  He underwent left fem-BK pop BPG with vein on 01/06/2013 for a non-healing wound.  This occluded early and on 01/10/2013 he underwent left femoral to BK pop BPG with PTFE.  He developed a groin infection that was treated with Abx  And a wound vac.  He was being followed in the office for the left groin wound which has had the anterior surface of the graft exposed.  The patient called the office today and reported blood on the dressing since last night.  She was told to come to the ER.Marland Kitchen  Patient denies fevers.  Past Medical History  Diagnosis Date  . GERD (gastroesophageal reflux disease)   . Hyperlipidemia   . Hypertension   . Peripheral vascular disease   . Internal and external bleeding hemorrhoids   . History of gout     left great toe  . Depression   . Numbness in right leg     Hx: of diabetes  . Coronary artery disease     RCA occlusion with good collaterals, o/w no obs CAD 12/2011  . Pneumonia ~ 1977  . Type II diabetes mellitus   . History of blood transfusion 12/2011; 12/2012    "before OR; after OR" (01/22/2013)  . Stroke 2008    "left side weak; unable to move left hand still" (01/22/2013)  . Rectal cancer dx'd 08/2007    S/P chemo, radiation, biopsies  . Metastases to the liver dx'd 08/2010    Past Surgical History  Procedure Laterality Date  . Carotid endarterectomy Right 2008  . Liver cryoablation  08/2010    "chemo shrunk the cancer 50% then they went in and burned the rest of it"  . Axillary-femoral bypass graft  01/29/2012    Procedure: BYPASS GRAFT AXILLA-BIFEMORAL;  Surgeon: Sherren Kerns, MD;  Location: [facility] OR;   Service: Vascular;  Laterality: N/A;  Left axilla-bifemoral bypass using gore-tex graft.   . Colonoscopy w/ biopsies and polypectomy      Hx: of  . Femoral-popliteal bypass graft Left 01/06/2013    Procedure: Femoral-Popliteal Artery Bypass;  Surgeon: Sherren Kerns, MD;  Location: [facility] OR;  Service: Vascular;  Laterality: Left;  . Intraoperative arteriogram Left 01/06/2013    Procedure: INTRA OPERATIVE ARTERIOGRAM;  Surgeon: Sherren Kerns, MD;  Location: [facility] OR;  Service: Vascular;  Laterality: Left;  . Femoral-popliteal bypass graft Left 01/10/2013    Procedure: THROMBECTOMY POSSIBLE BYPASS GRAFT FEMORAL-POPLITEAL ARTERY;  Surgeon: Pryor Ochoa, MD;  Location: [facility] OR;  Service: Vascular;  Laterality: Left;  Attempted Thrombectomy of left Femoral/Popliteal graft - Unsuccessful.  Insertion of Left femoral to below the knee popliteal propaten graft.  . Mass biopsy  2009    "found mass that was cancer; later had chemo and radiation; never cut mass out" (01/22/2013)    History   Social History  . Marital Status: Married    Spouse Name: N/A    Number of Children: N/A  . Years of Education: N/A   Occupational History  . Retired - [employer]     2004   Social History Main Topics  .  Smoking status: Former Smoker -- 0.10 packs/day for 57 years    Types: Cigarettes  . Smokeless tobacco: Never Used     Comment: pt states that he has only smoked 1/2 cig since 02/06/2013  . Alcohol Use: No  . Drug Use: No     Comment: 01/22/2013 "used marijuana in the 1960's"  . Sexual Activity: Not Currently   Other Topics Concern  . Not on file   Social History Narrative   2 siblings, no known CAD. No children.    Family History  Problem Relation Age of Onset  . Cancer - Other Sister     Allergies as of 03/06/2013 - Review Complete 03/06/2013  Allergen Reaction Noted  . Other  06/19/2011  . Tape Hives and Itching 04/01/2012    No current facility-administered medications on file prior to  encounter.   Current Outpatient Prescriptions on File Prior to Encounter  Medication Sig Dispense Refill  . Ascorbic Acid (VITAMIN C) 1000 MG tablet Take 1,000 mg by mouth daily.      Marland Kitchen buPROPion (ZYBAN) 150 MG 12 hr tablet Take 150 mg by mouth daily.       Marland Kitchen gabapentin (NEURONTIN) 300 MG capsule Take 300 mg by mouth 3 (three) times daily.      Marland Kitchen omega-3 acid ethyl esters (LOVAZA) 1 G capsule Take 2 g by mouth 2 (two) times daily.       Marland Kitchen omeprazole (PRILOSEC) 40 MG capsule Take 40 mg by mouth daily.      . Probiotic Product (ALIGN PO) Take 1 tablet by mouth at bedtime.         REVIEW OF SYSTEMS: See HPI   PHYSICAL EXAMINATION: General: The patient appears their stated age.  Vital signs are BP 146/58  Pulse 95  Temp(Src) 98.2 F (36.8 C) (Oral)  Resp 18  Ht 5\' 9"  (1.753 m)  Wt 181 lb (82.101 kg)  BMI 26.72 kg/m2  SpO2 95% Pulmonary: Respirations are non-labored HEENT:  No gross abnormalities Musculoskeletal: There are no major deformities.   Neurologic: No focal weakness or paresthesias are detected, Psychiatric: The patient has normal affect. Cardiovascular: Doppler signals ion foot which is edematous  Left groin wound with 2mm external opening.  This was probed and bright blood was on the q-tip.  If the wound is left alone, there is no blood welling up.  I suspect the blood is from raw tissue along the wound tract  \  Assessment:  Left groin wound Plan: I discussed the complexity of the situation with the patient and wife.  I feel like the blood seen is from the tract of the wound, but I can not rule out an infection within the graft.  I do not think an imaging study will help clarify the situation as the bleeding is not significant.  I discussed that if the graft is the source, he could have a blow out of the anastamosis.  I told the wife what to do should this occur, but I think this is not likely.  I do not see any evidence of infection on the skin surface.  I offered  the patient admission to the hospital for observation, however he would prefer to go home.  I do not think wound exploration at this time is warented, as the patient has been trying to get this groin wound to heel for several weeks.  He will follow up in the office this week     V. Wells Nyzier Boivin IV,  M.D. [facility] [place] Office: [phone] Pager:  [phone]

## 2013-03-09 ENCOUNTER — Ambulatory Visit (HOSPITAL_BASED_OUTPATIENT_CLINIC_OR_DEPARTMENT_OTHER): Payer: Medicare Other

## 2013-03-09 VITALS — BP 120/56 | HR 93 | Temp 97.7°F | Resp 18

## 2013-03-09 DIAGNOSIS — Z452 Encounter for adjustment and management of vascular access device: Secondary | ICD-10-CM

## 2013-03-09 DIAGNOSIS — C210 Malignant neoplasm of anus, unspecified: Secondary | ICD-10-CM

## 2013-03-09 MED ORDER — SODIUM CHLORIDE 0.9 % IJ SOLN
10.0000 mL | INTRAMUSCULAR | Status: DC | PRN
  Administered 2013-03-09: 10 mL via INTRAVENOUS
  Filled 2013-03-09: qty 10

## 2013-03-09 MED ORDER — HEPARIN SOD (PORK) LOCK FLUSH 100 UNIT/ML IV SOLN
500.0000 [IU] | Freq: Once | INTRAVENOUS | Status: AC
  Administered 2013-03-09: 500 [IU] via INTRAVENOUS
  Filled 2013-03-09: qty 5

## 2013-03-10 ENCOUNTER — Telehealth: Payer: Self-pay | Admitting: Vascular Surgery

## 2013-03-10 NOTE — Telephone Encounter (Addendum)
Message copied by Fredrich Birks on Tue Mar 10, 2013  9:11 AM ------      Message from: Melene Plan      Created: Mon Mar 09, 2013 10:06 AM                   ----- Message -----         From: Nada Libman, MD         Sent: 03/06/2013   5:58 PM           To: Melene Plan, RN, Jeralene Peters Roux            03/06/2013:  Saw pt in ER for blood on dressing.  Needs to follow up with [facility] on Thursday this week ------  03/10/13: spoke with patients wife, dpm

## 2013-03-11 ENCOUNTER — Encounter: Payer: Self-pay | Admitting: Vascular Surgery

## 2013-03-12 ENCOUNTER — Other Ambulatory Visit: Payer: Self-pay | Admitting: Vascular Surgery

## 2013-03-12 ENCOUNTER — Encounter: Payer: Self-pay | Admitting: Vascular Surgery

## 2013-03-12 ENCOUNTER — Ambulatory Visit (INDEPENDENT_AMBULATORY_CARE_PROVIDER_SITE_OTHER): Payer: Self-pay | Admitting: Vascular Surgery

## 2013-03-12 VITALS — BP 151/69 | HR 86 | Resp 16 | Ht 69.0 in | Wt 179.9 lb

## 2013-03-12 DIAGNOSIS — I739 Peripheral vascular disease, unspecified: Secondary | ICD-10-CM

## 2013-03-12 MED ORDER — CEPHALEXIN 500 MG PO CAPS: 500.0000 mg | ORAL_CAPSULE | Freq: Three times a day (TID) | ORAL | Status: DC

## 2013-03-12 NOTE — Progress Notes (Signed)
[facility] POST OPERATIVE OFFICE NOTE  CC:  F/u for surgery  HPI:  This is a 67 y.o. male who is s/p left femoral to popliteal bypass grafting with PTFE on 01/10/13.  He has had prior axbifem. The most recent operation was done for a nonhealing wound of his left first toe. He had breakdown of his left groin and has been using hydrogel.  He also had breakdown of his left medial thigh incision and is covering this with a dry dressing. This has a small amount of drainage daily.Donald [name]  He states he has been doing well.  His wife states there has been a little bit of drainage from the ulcer on his left great toe.  the ulcer on the tip of his right first toe is unchanged.  He was seen in the ER by my partner Dr. Myra Gianotti last week for some bleeding from the left groin. This was thought to be due to the wound tract itself and not graft bleeding.    Allergies   Allergen  Reactions   .  Other         Became hypotensive. Pt states allergic to "muscle relaxer" but unsure of name.   .  Tape  Hives and Itching       medfix non woven tape       Current Outpatient Prescriptions   Medication  Sig  Dispense  Refill   .  acidophilus (RISAQUAD) CAPS  Take 1 capsule by mouth daily.          .  Ascorbic Acid (VITAMIN C) 1000 MG tablet  Take 1,000 mg by mouth daily.         Donald [name]  buPROPion (ZYBAN) 150 MG 12 hr tablet  Take 150 mg by mouth daily.          Donald [name]  dipyridamole-aspirin (AGGRENOX) 200-25 MG per 12 hr capsule  Take 1 capsule by mouth 2 (two) times daily.   180 capsule   1   .  docusate sodium (COLACE) 100 MG capsule  Take 200 mg by mouth 2 (two) times daily.         Donald [name]  gabapentin (NEURONTIN) 300 MG capsule  Take 300 mg by mouth 3 (three) times daily.         Donald [name]  lisinopril (PRINIVIL,ZESTRIL) 5 MG tablet  Take 0.5 tablets (2.5 mg total) by mouth daily.         .  metFORMIN (GLUMETZA) 500 MG (MOD) 24 hr tablet  Take 500 mg by mouth 2 (two) times daily with a meal.          .  mupirocin ointment  (BACTROBAN) 2 %  1 Application, nasally, 2 times daily for 5 days prior to surgery   22 g   0   .  omega-3 acid ethyl esters (LOVAZA) 1 G capsule  Take 2 g by mouth 2 (two) times daily.          Donald [name]  omeprazole (PRILOSEC) 40 MG capsule  Take 40 mg by mouth daily.         Donald [name]  oxyCODONE (OXY IR/ROXICODONE) 5 MG immediate release tablet  Take 1 tablet (5 mg total) by mouth every 4 (four) hours as needed for pain.   30 tablet   0   .  oxyCODONE (OXY IR/ROXICODONE) 5 MG immediate release tablet  Take 1 tablet (5 mg total) by mouth every 6 (six) hours as needed for pain.   30  tablet   0   .  Probiotic Product (ALIGN PO)  Take 1 tablet by mouth at bedtime.            No current facility-administered medications for this visit.      ROS:  See HPI  Physical Exam:     Filed Vitals:   03/12/13 1242  BP: 151/69  Pulse: 86  Resp: 16  Height: 5\' 9"  (1.753 m)  Weight: 179 lb 14.4 oz (81.602 kg)   Incision:  Left groin healed with small open area mid incision.  the wound opening in the left groin is now 1-1.5 mm in diameter. There is a small amount of serous drainage. This was sent for cultures today. No active bleeding no obvious graft exposed There is small 1 cm segment of the anterior wall the graft exposed.  Left medial mid thigh incision is slowly healing with minimal drainage. Extremities:  No drainage from left great toe expressible which is gangrenous at the tip. Overall this appears dry   There is a small area of ulceration on his right great toe 2 mm dark color overall stable   A/P:  slowly healing left groin wound still at risk for graft infection -continue hydrogel to left groin wound. Keflex 500 mg 3 times a day started today for left groin drainage -will have pt return in 1 week for a wound check and to followup in cultures -dry dressing to left great toe -pt will contact us if wounds worsen    He is still a very high risk for graft infection and bilateral limb loss.  Fabienne Bruns,  MD [facility] [place] Office: [phone] Pager: [phone]

## 2013-03-15 LAB — WOUND CULTURE
Gram Stain: NONE SEEN
Organism ID, Bacteria: NO GROWTH

## 2013-03-18 ENCOUNTER — Encounter: Payer: Self-pay | Admitting: Vascular Surgery

## 2013-03-19 ENCOUNTER — Encounter: Payer: Self-pay | Admitting: Vascular Surgery

## 2013-03-19 ENCOUNTER — Ambulatory Visit (INDEPENDENT_AMBULATORY_CARE_PROVIDER_SITE_OTHER): Payer: Self-pay | Admitting: Vascular Surgery

## 2013-03-19 VITALS — BP 154/61 | HR 84 | Ht 69.0 in | Wt 182.0 lb

## 2013-03-19 DIAGNOSIS — I739 Peripheral vascular disease, unspecified: Secondary | ICD-10-CM

## 2013-03-19 NOTE — Progress Notes (Signed)
[facility] POST OPERATIVE OFFICE NOTE  CC:  F/u for surgery  HPI:  This is a 67 y.o. male who is s/p left femoral to popliteal bypass grafting with PTFE on 01/10/13.  He has had prior axbifem. The most recent operation was done for a nonhealing wound of his left first toe. He had breakdown of his left groin and has been using hydrogel.  He also had breakdown of his left medial thigh incision and is covering this with a dry dressing. This has a small amount of drainage daily.Marland Kitchen  He states he has been doing well.  His wife states there has been a little bit of drainage from the ulcer on his left great toe.  the ulcer on the tip of his right first toe is unchanged.  he is currently on a course of Keflex. Wound culture taken from his groin at his previous office visit had no growth.     Current Outpatient Prescriptions on File Prior to Visit  Medication Sig Dispense Refill  . Ascorbic Acid (VITAMIN C) 1000 MG tablet Take 1,000 mg by mouth daily.      Marland Kitchen buPROPion (ZYBAN) 150 MG 12 hr tablet Take 150 mg by mouth daily.       . cephALEXin (KEFLEX) 500 MG capsule Take 1 capsule (500 mg total) by mouth 3 (three) times daily.  42 capsule  0  . dipyridamole-aspirin (AGGRENOX) 200-25 MG per 12 hr capsule Take 1 capsule by mouth 2 (two) times daily.      Marland Kitchen docusate sodium (COLACE) 100 MG capsule Take 200 mg by mouth 2 (two) times daily as needed for mild constipation.      . gabapentin (NEURONTIN) 300 MG capsule Take 300 mg by mouth 3 (three) times daily.      Marland Kitchen lisinopril (PRINIVIL,ZESTRIL) 5 MG tablet Take 2.5 mg by mouth daily.      . metFORMIN (GLUCOPHAGE) 500 MG tablet Take 500 mg by mouth 2 (two) times daily with a meal.      . omega-3 acid ethyl esters (LOVAZA) 1 G capsule Take 2 g by mouth 2 (two) times daily.       Marland Kitchen omeprazole (PRILOSEC) 40 MG capsule Take 40 mg by mouth daily.      Marland Kitchen oxyCODONE (OXY IR/ROXICODONE) 5 MG immediate release tablet Take 5 mg by mouth every 4 (four) hours  as needed for severe pain.      . Probiotic Product (ALIGN PO) Take 1 tablet by mouth at bedtime.       No current facility-administered medications on file prior to visit.     ROS:  See HPI  Physical Exam:        Filed Vitals:   03/19/13 1607  BP: 154/61  Pulse: 84  Height: 5\' 9"  (1.753 m)  Weight: 182 lb (82.555 kg)  SpO2: 100%   Incision:  Left groin healed with small open area mid incision.  the wound opening in the left groin is now 1-1.5 mm in diameter. There is a small amount of serous drainage.  Left medial mid thigh incision is slowly healing with minimal drainage. Extremities:  No drainage from left great toe expressible which is gangrenous at the tip. Overall this appears dry   There is a small area of ulceration on his right great toe 2 mm dark color overall stable   A/P:  slowly healing left groin wound still at risk for graft infection, now with a sinus tract -continue hydrogel  to left groin wound. Keflex 500 mg 3 times a day started today for left groin drainage -will have pt return in 4 weeks for a wound check and to followup in cultures -dry dressing to left great toe -pt will contact us if wounds worsen    He is still a very high risk for graft infection and bilateral limb loss.  Fabienne Bruns, MD [facility] [place] Office: [phone] Pager: [phone]

## 2013-04-08 ENCOUNTER — Ambulatory Visit (INDEPENDENT_AMBULATORY_CARE_PROVIDER_SITE_OTHER): Payer: Medicare Other

## 2013-04-08 DIAGNOSIS — M216X9 Other acquired deformities of unspecified foot: Secondary | ICD-10-CM

## 2013-04-08 DIAGNOSIS — I6529 Occlusion and stenosis of unspecified carotid artery: Secondary | ICD-10-CM

## 2013-04-08 DIAGNOSIS — I69354 Hemiplegia and hemiparesis following cerebral infarction affecting left non-dominant side: Secondary | ICD-10-CM

## 2013-04-09 ENCOUNTER — Encounter (HOSPITAL_COMMUNITY): Payer: Self-pay | Admitting: Emergency Medicine

## 2013-04-09 ENCOUNTER — Telehealth: Payer: Self-pay | Admitting: *Deleted

## 2013-04-09 ENCOUNTER — Inpatient Hospital Stay (HOSPITAL_COMMUNITY)
Admission: EM | Admit: 2013-04-09 | Discharge: 2013-04-13 | DRG: 863 | Disposition: A | Discharge status: Home Health | Payer: Medicare Other | Attending: Vascular Surgery | Admitting: Vascular Surgery

## 2013-04-09 ENCOUNTER — Inpatient Hospital Stay (HOSPITAL_COMMUNITY): Payer: Medicare Other

## 2013-04-09 DIAGNOSIS — K219 Gastro-esophageal reflux disease without esophagitis: Secondary | ICD-10-CM | POA: Diagnosis present

## 2013-04-09 DIAGNOSIS — I2510 Atherosclerotic heart disease of native coronary artery without angina pectoris: Secondary | ICD-10-CM | POA: Diagnosis present

## 2013-04-09 DIAGNOSIS — M7989 Other specified soft tissue disorders: Secondary | ICD-10-CM

## 2013-04-09 DIAGNOSIS — Z79899 Other long term (current) drug therapy: Secondary | ICD-10-CM

## 2013-04-09 DIAGNOSIS — I739 Peripheral vascular disease, unspecified: Secondary | ICD-10-CM | POA: Diagnosis present

## 2013-04-09 DIAGNOSIS — Z8673 Personal history of transient ischemic attack (TIA), and cerebral infarction without residual deficits: Secondary | ICD-10-CM

## 2013-04-09 DIAGNOSIS — Z7902 Long term (current) use of antithrombotics/antiplatelets: Secondary | ICD-10-CM

## 2013-04-09 DIAGNOSIS — Z87891 Personal history of nicotine dependence: Secondary | ICD-10-CM

## 2013-04-09 DIAGNOSIS — Y832 Surgical operation with anastomosis, bypass or graft as the cause of abnormal reaction of the patient, or of later complication, without mention of misadventure at the time of the procedure: Secondary | ICD-10-CM | POA: Diagnosis present

## 2013-04-09 DIAGNOSIS — T8140XA Infection following a procedure, unspecified, initial encounter: Principal | ICD-10-CM | POA: Diagnosis present

## 2013-04-09 DIAGNOSIS — Z85048 Personal history of other malignant neoplasm of rectum, rectosigmoid junction, and anus: Secondary | ICD-10-CM

## 2013-04-09 DIAGNOSIS — I10 Essential (primary) hypertension: Secondary | ICD-10-CM | POA: Diagnosis present

## 2013-04-09 DIAGNOSIS — T148XXA Other injury of unspecified body region, initial encounter: Secondary | ICD-10-CM | POA: Diagnosis present

## 2013-04-09 DIAGNOSIS — E785 Hyperlipidemia, unspecified: Secondary | ICD-10-CM | POA: Diagnosis present

## 2013-04-09 DIAGNOSIS — E119 Type 2 diabetes mellitus without complications: Secondary | ICD-10-CM | POA: Diagnosis present

## 2013-04-09 DIAGNOSIS — T798XXA Other early complications of trauma, initial encounter: Secondary | ICD-10-CM

## 2013-04-09 LAB — BASIC METABOLIC PANEL
BUN: 16 mg/dL (ref 6–23)
CO2: 20 mEq/L (ref 19–32)
Chloride: 103 mEq/L (ref 96–112)
Creatinine, Ser: 1.09 mg/dL (ref 0.50–1.35)
GFR calc Af Amer: 78 mL/min — ABNORMAL LOW (ref >90)
Glucose, Bld: 232 mg/dL — ABNORMAL HIGH (ref 70–99)

## 2013-04-09 LAB — CBC WITH DIFFERENTIAL/PLATELET
Basophils Absolute: 0 10*3/uL (ref 0.0–0.1)
HCT: 31.4 % — ABNORMAL LOW (ref 39.0–52.0)
Hemoglobin: 9.3 g/dL — ABNORMAL LOW (ref 13.0–17.0)
Lymphocytes Relative: 24 % (ref 12–46)
Lymphs Abs: 1.4 10*3/uL (ref 0.7–4.0)
MCV: 79.5 fL (ref 78.0–100.0)
Monocytes Absolute: 0.5 10*3/uL (ref 0.1–1.0)
Monocytes Relative: 8 % (ref 3–12)
Neutro Abs: 3.9 10*3/uL (ref 1.7–7.7)
Neutrophils Relative %: 65 % (ref 43–77)
RDW: 15.5 % (ref 11.5–15.5)
WBC: 6 10*3/uL (ref 4.0–10.5)

## 2013-04-09 LAB — URINALYSIS, ROUTINE W REFLEX MICROSCOPIC
Hgb urine dipstick: NEGATIVE
Leukocytes, UA: NEGATIVE
Nitrite: NEGATIVE
Specific Gravity, Urine: 1.036 — ABNORMAL HIGH (ref 1.005–1.030)
pH: 6 (ref 5.0–8.0)

## 2013-04-09 LAB — GLUCOSE, CAPILLARY: Glucose-Capillary: 164 mg/dL — ABNORMAL HIGH (ref 70–99)

## 2013-04-09 LAB — PROTIME-INR: INR: 0.91 (ref 0.00–1.49)

## 2013-04-09 MED ORDER — LABETALOL HCL 5 MG/ML IV SOLN
10.0000 mg | INTRAVENOUS | Status: DC | PRN
  Filled 2013-04-09: qty 4

## 2013-04-09 MED ORDER — HYDRALAZINE HCL 20 MG/ML IJ SOLN: 10.0000 mg | INTRAMUSCULAR | Status: DC | PRN

## 2013-04-09 MED ORDER — ALUM & MAG HYDROXIDE-SIMETH 200-200-20 MG/5ML PO SUSP: 15.0000 mL | ORAL | Status: DC | PRN

## 2013-04-09 MED ORDER — KCL IN DEXTROSE-NACL 20-5-0.45 MEQ/L-%-% IV SOLN
INTRAVENOUS | Status: DC
  Administered 2013-04-10 – 2013-04-12 (×3): via INTRAVENOUS
  Filled 2013-04-09 (×6): qty 1000

## 2013-04-09 MED ORDER — PANTOPRAZOLE SODIUM 40 MG PO TBEC
40.0000 mg | DELAYED_RELEASE_TABLET | Freq: Every day | ORAL | Status: DC
  Administered 2013-04-10 – 2013-04-11 (×2): 40 mg via ORAL
  Filled 2013-04-09 (×2): qty 1

## 2013-04-09 MED ORDER — OXYCODONE-ACETAMINOPHEN 5-325 MG PO TABS
1.0000 | ORAL_TABLET | ORAL | Status: DC | PRN
  Administered 2013-04-09 – 2013-04-12 (×7): 2 via ORAL
  Filled 2013-04-09: qty 2
  Filled 2013-04-09: qty 1
  Filled 2013-04-09 (×7): qty 2

## 2013-04-09 MED ORDER — CEFAZOLIN SODIUM 1-5 GM-% IV SOLN
1.0000 g | Freq: Three times a day (TID) | INTRAVENOUS | Status: DC
  Administered 2013-04-09 – 2013-04-11 (×6): 1 g via INTRAVENOUS
  Filled 2013-04-09 (×8): qty 50

## 2013-04-09 MED ORDER — GUAIFENESIN-DM 100-10 MG/5ML PO SYRP: 15.0000 mL | ORAL_SOLUTION | ORAL | Status: DC | PRN

## 2013-04-09 MED ORDER — ONDANSETRON HCL 4 MG/2ML IJ SOLN: 4.0000 mg | Freq: Four times a day (QID) | INTRAMUSCULAR | Status: DC | PRN

## 2013-04-09 MED ORDER — POTASSIUM CHLORIDE CRYS ER 20 MEQ PO TBCR: 20.0000 meq | EXTENDED_RELEASE_TABLET | Freq: Once | ORAL | Status: DC

## 2013-04-09 MED ORDER — PHENOL 1.4 % MT LIQD
1.0000 | OROMUCOSAL | Status: DC | PRN
  Filled 2013-04-09: qty 177

## 2013-04-09 MED ORDER — IOHEXOL 300 MG/ML  SOLN
100.0000 mL | Freq: Once | INTRAMUSCULAR | Status: AC | PRN
  Administered 2013-04-09: 100 mL via INTRAVENOUS

## 2013-04-09 MED ORDER — METOPROLOL TARTRATE 1 MG/ML IV SOLN: 2.0000 mg | INTRAVENOUS | Status: DC | PRN

## 2013-04-09 NOTE — ED Provider Notes (Signed)
CSN: [id number]     Arrival date & time 04/09/13  1500 History   First MD Initiated Contact with Patient 04/09/13 1517     Chief Complaint  Patient presents with  . Coagulation Disorder   (Consider location/radiation/quality/duration/timing/severity/associated sxs/prior Treatment) Patient is a 67 y.o. male presenting with groin pain. The history is provided by the patient. No language interpreter was used.  Groin Pain Pertinent negatives include no abdominal pain, chills or fever. Associated symptoms comments: He reports bleeding from a non-healing surgical incision last night and today. No pain, redness or fever. The previous surgery was a fem-pop bypass done in September, 2014. He was treated for a subsequent infection with wound vac, and has non-healing area since that time. .    Past Medical History  Diagnosis Date  . GERD (gastroesophageal reflux disease)   . Hyperlipidemia   . Hypertension   . Peripheral vascular disease   . Internal and external bleeding hemorrhoids   . History of gout     left great toe  . Depression   . Numbness in right leg     Hx: of diabetes  . Coronary artery disease     RCA occlusion with good collaterals, o/w no obs CAD 12/2011  . Pneumonia ~ 1977  . Type II diabetes mellitus   . History of blood transfusion 12/2011; 12/2012    "before OR; after OR" (01/22/2013)  . Stroke 2008    "left side weak; unable to move left hand still" (01/22/2013)  . Rectal cancer dx'd 08/2007    S/P chemo, radiation, biopsies  . Metastases to the liver dx'd 08/2010   Past Surgical History  Procedure Laterality Date  . Carotid endarterectomy Right 2008  . Liver cryoablation  08/2010    "chemo shrunk the cancer 50% then they went in and burned the rest of it"  . Axillary-femoral bypass graft  01/29/2012    Procedure: BYPASS GRAFT AXILLA-BIFEMORAL;  Surgeon: Sherren Kerns, MD;  Location: [facility];  Service: Vascular;  Laterality: N/A;  Left axilla-bifemoral bypass using  gore-tex graft.   . Colonoscopy w/ biopsies and polypectomy      Hx: of  . Femoral-popliteal bypass graft Left 01/06/2013    Procedure: Femoral-Popliteal Artery Bypass;  Surgeon: Sherren Kerns, MD;  Location: [facility] OR;  Service: Vascular;  Laterality: Left;  . Intraoperative arteriogram Left 01/06/2013    Procedure: INTRA OPERATIVE ARTERIOGRAM;  Surgeon: Sherren Kerns, MD;  Location: [facility] OR;  Service: Vascular;  Laterality: Left;  . Femoral-popliteal bypass graft Left 01/10/2013    Procedure: THROMBECTOMY POSSIBLE BYPASS GRAFT FEMORAL-POPLITEAL ARTERY;  Surgeon: Pryor Ochoa, MD;  Location: [facility] OR;  Service: Vascular;  Laterality: Left;  Attempted Thrombectomy of left Femoral/Popliteal graft - Unsuccessful.  Insertion of Left femoral to below the knee popliteal propaten graft.  . Mass biopsy  2009    "found mass that was cancer; later had chemo and radiation; never cut mass out" (01/22/2013)   Family History  Problem Relation Age of Onset  . Cancer - Other Sister    History  Substance Use Topics  . Smoking status: Former Smoker -- 0.10 packs/day for 57 years    Types: Cigarettes  . Smokeless tobacco: Never Used     Comment: pt states that he has only smoked 1/2 cig since 02/06/2013  . Alcohol Use: No    Review of Systems  Constitutional: Negative for fever and chills.  Gastrointestinal: Negative.  Negative for abdominal pain.  Genitourinary: Negative.  Musculoskeletal: Negative.   Skin: Positive for wound.       See HPI.  Neurological: Negative.     Allergies  Other and Tape  Home Medications   Current Outpatient Rx  Name  Route  Sig  Dispense  Refill  . Ascorbic Acid (VITAMIN C) 1000 MG tablet   Oral   Take 1,000 mg by mouth daily.         . cephALEXin (KEFLEX) 500 MG capsule   Oral   Take 1 capsule (500 mg total) by mouth 3 (three) times daily.   42 capsule   0   . dipyridamole-aspirin (AGGRENOX) 200-25 MG per 12 hr capsule   Oral   Take 1 capsule by mouth 2  (two) times daily.         Marland Kitchen docusate sodium (COLACE) 100 MG capsule   Oral   Take 200 mg by mouth 2 (two) times daily as needed for mild constipation.         . gabapentin (NEURONTIN) 300 MG capsule   Oral   Take 300 mg by mouth 3 (three) times daily.         Marland Kitchen lisinopril (PRINIVIL,ZESTRIL) 5 MG tablet   Oral   Take 2.5 mg by mouth daily.         . metFORMIN (GLUCOPHAGE) 500 MG tablet   Oral   Take 500 mg by mouth 2 (two) times daily with a meal.         . omega-3 acid ethyl esters (LOVAZA) 1 G capsule   Oral   Take 2 g by mouth 2 (two) times daily.          Marland Kitchen omeprazole (PRILOSEC) 40 MG capsule   Oral   Take 40 mg by mouth daily.         Marland Kitchen oxyCODONE (OXY IR/ROXICODONE) 5 MG immediate release tablet   Oral   Take 5 mg by mouth every 4 (four) hours as needed for severe pain.         . Probiotic Product (ALIGN PO)   Oral   Take 1 tablet by mouth at bedtime.          BP 153/56  Pulse 98  Temp(Src) 98 F (36.7 C) (Oral)  Resp 18  SpO2 98% Physical Exam  Constitutional: He is oriented to person, place, and time. He appears well-developed and well-nourished. No distress.  Pulmonary/Chest: Effort normal.  Abdominal: Soft. There is no tenderness.  Musculoskeletal: He exhibits edema. He exhibits no tenderness.  Left lower extremity is warm to touch, without redness, non-tender.   Neurological: He is alert and oriented to person, place, and time.  Skin: Skin is warm and dry.  Small, open surgical wound left groin. No active bleeding. Non-tender. No palpable hematoma or redness.     ED Course  Procedures (including critical care time) Labs Review Labs Reviewed  CBC WITH DIFFERENTIAL  BASIC METABOLIC PANEL   Results for orders placed during the hospital encounter of 04/09/13  CBC WITH DIFFERENTIAL      Result Value Range   WBC 6.0  4.0 - 10.5 K/uL   RBC 3.95 (*) 4.22 - 5.81 MIL/uL   Hemoglobin 9.3 (*) 13.0 - 17.0 g/dL   HCT 16.1 (*) 09.6 - 04.5  %   MCV 79.5  78.0 - 100.0 fL   MCH 23.5 (*) 26.0 - 34.0 pg   MCHC 29.6 (*) 30.0 - 36.0 g/dL   RDW 40.9  81.1 - 91.4 %  Platelets 304  150 - 400 K/uL   Neutrophils Relative % 65  43 - 77 %   Neutro Abs 3.9  1.7 - 7.7 K/uL   Lymphocytes Relative 24  12 - 46 %   Lymphs Abs 1.4  0.7 - 4.0 K/uL   Monocytes Relative 8  3 - 12 %   Monocytes Absolute 0.5  0.1 - 1.0 K/uL   Eosinophils Relative 3  0 - 5 %   Eosinophils Absolute 0.2  0.0 - 0.7 K/uL   Basophils Relative 1  0 - 1 %   Basophils Absolute 0.0  0.0 - 0.1 K/uL  BASIC METABOLIC PANEL      Result Value Range   Sodium 137  135 - 145 mEq/L   Potassium 4.7  3.5 - 5.1 mEq/L   Chloride 103  96 - 112 mEq/L   CO2 20  19 - 32 mEq/L   Glucose, Bld 232 (*) 70 - 99 mg/dL   BUN 16  6 - 23 mg/dL   Creatinine, Ser 4.09  0.50 - 1.35 mg/dL   Calcium 9.3  8.4 - 81.1 mg/dL   GFR calc non Af Amer 67 (*) >90 mL/min   GFR calc Af Amer 78 (*) >90 mL/min    Imaging Review No results found.  EKG Interpretation   None       MDM  No diagnosis found. 1. Post-operative infection  Dr. Barry Dienes of vascular surgery consulted and plans to admit patient for inpatient treatment. The patient has remained comfortable in ED. Stable at time of admission.  Venous doppler reported as "negative for DVT" by doppler tech.  Arnoldo Hooker, PA-C 04/09/13 1828  Arnoldo Hooker, PA-C 04/09/13 1908

## 2013-04-09 NOTE — ED Notes (Signed)
Vascular lab tech to bedside for [place]

## 2013-04-09 NOTE — Telephone Encounter (Signed)
[name] called to say Donald [name]'s Left groin had started bleeding today. She has had to put a larger dressing on and it is still running down his leg. She said it had been oozing every day but this was a change. I spoke with Dr Darrick Penna and he said to have him go to the ER. She said they would head there now.

## 2013-04-09 NOTE — ED Notes (Signed)
Pt reports he had bypass surgery in September and has had issues with groin bleeding since. This am the site started to ooze blood again wife states she placed a thick dressing but blood is still oozing through. Pt is on blood thinners. No pain

## 2013-04-09 NOTE — ED Provider Notes (Signed)
  Face-to-face evaluation   History: Intermittent bleeding, left groin from a nonhealing wound after bypass grafting. He was seen by his PCP 2 weeks ago, then by his vascular surgeon several days ago. He's being treated for infection in or around the graft.  Physical exam: Left groin with open wound draining serosanguineous material, and associated with an indurated area about 3 cm it is consistent with a subcutaneous abscess.  Assessment: Infected wound versus infected graft without systemic symptoms. He is a diabetic and at risk for worsening symptoms without aggressive treatment. We will contact his vascular surgeon and arrange a treatment plan.  Medical screening examination/treatment/procedure(s) were conducted as a shared visit with non-physician practitioner(s) and myself.  I personally evaluated the patient during the encounter  Flint Melter, MD 04/09/13 2005

## 2013-04-09 NOTE — H&P (Addendum)
[facility] [place]  Patient name: Donald [name] MRN: [id number] DOB: [birth year] Sex: male  REASON FOR ADMISSION: Bloody drainage from left groin.  HPI: Donald [name] is a 67 y.o. male who had a left femoral to below knee popliteal bypass with vein  on 01/06/2013 by Dr. Darrick Penna. He has undergone a prior left axillobifemoral bypass on 01/29/12 by Dr. Darrick Penna. The procedure was done for nonhealing wound of his left foot. His graft occluded 4 days postoperatively and he was taken back to the operating room on 01/10/2013. The vein graft could not be successfully opened and he had insertion of a new left femoral to below knee popliteal artery bypass with a 6 mm PTFE graft. He has had a left groin wound and was last seen in the office by Dr. Darrick Penna on 03/19/2013. At that time he had a slowly healing left groin wound it was felt to still be at risk for graft infection. He was started on Keflex. He was to return in 4 weeks.  His wife has been doing dressing changes. Today he she noted that the dressing (2 - 4X4's) were saturated with blood. hhis wife spoke to the office today and he was sent to the emergency department. He states that there has been no further bleeding from the left groin wound. He denies fever or chills. He remains on Keflex.  Past Medical History  Diagnosis Date  . GERD (gastroesophageal reflux disease)   . Hyperlipidemia   . Hypertension   . Peripheral vascular disease   . Internal and external bleeding hemorrhoids   . History of gout     left great toe  . Depression   . Numbness in right leg     Hx: of diabetes  . Coronary artery disease     RCA occlusion with good collaterals, o/w no obs CAD 12/2011  . Pneumonia ~ 1977  . Type II diabetes mellitus   . History of blood transfusion 12/2011; 12/2012    "before OR; after OR" (01/22/2013)  . Stroke 2008    "left side weak; unable to move left hand still" (01/22/2013)  . Rectal cancer dx'd 08/2007   S/P chemo, radiation, biopsies  . Metastases to the liver dx'd 08/2010   Family History  Problem Relation Age of Onset  . Cancer - Other Sister    SOCIAL HISTORY: History  Substance Use Topics  . Smoking status: Former Smoker -- 0.10 packs/day for 57 years    Types: Cigarettes  . Smokeless tobacco: Never Used     Comment: pt states that he has only smoked 1/2 cig since 02/06/2013  . Alcohol Use: No   Allergies  Allergen Reactions  . Other     Became hypotensive. Pt states allergic to "muscle relaxer" but unsure of name.  . Tape Hives and Itching    medfix non woven tape    No current facility-administered medications for this encounter.   Current Outpatient Prescriptions  Medication Sig Dispense Refill  . Ascorbic Acid (VITAMIN C) 1000 MG tablet Take 1,000 mg by mouth daily.      . cephALEXin (KEFLEX) 500 MG capsule Take 1 capsule (500 mg total) by mouth 3 (three) times daily.  42 capsule  0  . dipyridamole-aspirin (AGGRENOX) 200-25 MG per 12 hr capsule Take 1 capsule by mouth 2 (two) times daily.      Marland Kitchen docusate sodium (COLACE) 100 MG capsule Take 200 mg by mouth 2 (two) times daily as  needed for mild constipation.      . gabapentin (NEURONTIN) 300 MG capsule Take 300 mg by mouth 3 (three) times daily.      Marland Kitchen lisinopril (PRINIVIL,ZESTRIL) 5 MG tablet Take 2.5 mg by mouth daily.      . metFORMIN (GLUCOPHAGE) 500 MG tablet Take 500 mg by mouth 2 (two) times daily with a meal.      . omega-3 acid ethyl esters (LOVAZA) 1 G capsule Take 2 g by mouth 2 (two) times daily.       Marland Kitchen omeprazole (PRILOSEC) 40 MG capsule Take 40 mg by mouth daily.      Marland Kitchen oxyCODONE (OXY IR/ROXICODONE) 5 MG immediate release tablet Take 5 mg by mouth every 4 (four) hours as needed for severe pain.      . Probiotic Product (ALIGN PO) Take 1 tablet by mouth at bedtime.       REVIEW OF SYSTEMS: Arly.Keller ] denotes positive finding; [  ] denotes negative finding CARDIOVASCULAR:  [ ]  chest pain   [ ]  chest pressure   [  ] palpitations   [ ]  orthopnea   [ ]  dyspnea on exertion   [ ]  claudication   [ ]  rest pain   [ ]  DVT   [ ]  phlebitis PULMONARY:   [ ]  productive cough   [ ]  asthma   [ ]  wheezing NEUROLOGIC:   [X]  weakness- left arm[ ]  paresthesias  [ ]  aphasia  [ ]  amaurosis  [ ]  dizziness HEMATOLOGIC:   [ ]  bleeding problems   [ ]  clotting disorders MUSCULOSKELETAL:  [ ]  joint pain   [ ]  joint swelling Arly.Keller ] leg swelling- left leg GASTROINTESTINAL: [ ]   blood in stool  [ ]   hematemesis GENITOURINARY:  [ ]   dysuria  [ ]   hematuria PSYCHIATRIC:  [ ]  history of major depression INTEGUMENTARY:  [ ]  rashes  [ ]  ulcers CONSTITUTIONAL:  [ ]  fever   [ ]  chills  PHYSICAL EXAM: Filed Vitals:   04/09/13 1504  BP: 153/56  Pulse: 98  Temp: 98 F (36.7 C)  TempSrc: Oral  Resp: 18  SpO2: 98%   There is no weight on file to calculate BMI. GENERAL: The patient is a well-nourished male, in no acute distress. The vital signs are documented above. CARDIOVASCULAR: There is a regular rate and rhythm. I do not detect carotid bruits. I cannot palpate pedal pulses although both feet are warm and well-perfused. His left lower extremity swelling and he says this has been chronic. PULMONARY: There is good air exchange bilaterally without wheezing or rales. ABDOMEN: Soft and non-tender with normal pitched bowel sounds.  MUSCULOSKELETAL: There are no major deformities or cyanosis. NEUROLOGIC: He has significant left upper extremity weakness from a previous stroke. SKIN: There is a small amount of drainage from the left groin which does not appear bloody at this point. I was unable to get a Q-tip into the wound as the wound has essentially healed over. The tissue in the left groin is indurated. PSYCHIATRIC: The patient has a normal affect.  DATA:  Lab Results  Component Value Date   WBC 6.0 04/09/2013   HGB 9.3* 04/09/2013   HCT 31.4* 04/09/2013   MCV 79.5 04/09/2013   PLT 304 04/09/2013   Lab Results  Component Value  Date   NA 138 03/06/2013   K 4.5 03/06/2013   CL 102 03/06/2013   CO2 24 03/06/2013   Lab Results  Component Value Date  CREATININE 1.18 03/06/2013   Lab Results  Component Value Date   INR 1.06 01/02/2013   INR 1.05 01/24/2012   INR 0.98 01/23/2012   Lab Results  Component Value Date   HGBA1C 7.4* 01/07/2013   He apparently had a wound culture done in the office although I do not see a result in the computer for this.  MEDICAL ISSUES:  BLOODY DRAINAGE FROM LEFT GROIN WOUND: Given that the wife reported bloody drainage from the left groin wound today I think the safest course is to admit him for observation. He will be started on intravenous Ancef. I have ordered a CT scan of the pelvis with IV contrast to further evaluate the left groin for abscess or potential graft infection. In addition, I have ordered a venous duplex scan of the left lower extremity given the left leg swelling.  If the CT scan suggests abscess or he has any further bleeding episodes he may potentially require exploration of the left groin wound. There is significant scar tissue present and the wound is very indurated. This was certainly increase the risk associated with exploration of the wound. If there is evidence of graft infection then he could potentially require graft removal which could potentially necessitate amputation. I will notify Dr. Darrick Penna of his admission in the morning.  Addie Alonge S [facility] [place] [facility]: [phone]  Addendum: CT scan reviewed: Some fluid near the proximal anastomosis of left fempop graft, but no definitive abscess.  Waverly Ferrari, MD, FACS [facility] [phone] 04/09/2013

## 2013-04-10 LAB — GLUCOSE, CAPILLARY
Glucose-Capillary: 179 mg/dL — ABNORMAL HIGH (ref 70–99)
Glucose-Capillary: 189 mg/dL — ABNORMAL HIGH (ref 70–99)

## 2013-04-10 NOTE — Progress Notes (Signed)
VASCULAR LAB PRELIMINARY  PRELIMINARY  PRELIMINARY  PRELIMINARY  Left lower extremity venous duplex completed.    Preliminary report:  Left:  No evidence of DVT, superficial thrombosis, or Baker's cyst.  Cung Masterson, RVS 04/10/2013, 11:58 AM

## 2013-04-10 NOTE — Progress Notes (Signed)
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  I discussed with the patient a right ax fem bypass to save his right leg with removal of the left ax fem and fem fem segment which would most likely result in a left above knee amputation.  He is currently stating he would rather bleed to death than lose his leg.  He will think about it over the weekend.  Continue antibiotics for now  Kazue Cerro E 04/10/2013 12:32 PM --  Laboratory Lab Results:  Recent Labs  04/09/13 1643  WBC 6.0  HGB 9.3*  HCT 31.4*  PLT 304   BMET  Recent Labs  04/09/13 1643  NA 137  K 4.7  CL 103  CO2 20  GLUCOSE 232*  BUN 16  CREATININE 1.09  CALCIUM 9.3    COAG Lab Results  Component Value Date   INR 0.91 04/09/2013   INR 1.06 01/02/2013   INR 1.05 01/24/2012   No results found for this basename: PTT

## 2013-04-10 NOTE — Progress Notes (Signed)
Utilization Review Completed.Donald [name] T12/03/2013  

## 2013-04-11 DIAGNOSIS — T82898A Other specified complication of vascular prosthetic devices, implants and grafts, initial encounter: Secondary | ICD-10-CM

## 2013-04-11 LAB — GLUCOSE, CAPILLARY
Glucose-Capillary: 184 mg/dL — ABNORMAL HIGH (ref 70–99)
Glucose-Capillary: 166 mg/dL — ABNORMAL HIGH (ref 70–99)

## 2013-04-11 MED ORDER — METFORMIN HCL 500 MG PO TABS
500.0000 mg | ORAL_TABLET | Freq: Two times a day (BID) | ORAL | Status: DC
  Administered 2013-04-11 – 2013-04-13 (×4): 500 mg via ORAL
  Filled 2013-04-11 (×6): qty 1

## 2013-04-11 MED ORDER — PIPERACILLIN-TAZOBACTAM 3.375 G IVPB
3.3750 g | Freq: Three times a day (TID) | INTRAVENOUS | Status: DC
  Administered 2013-04-11 – 2013-04-13 (×6): 3.375 g via INTRAVENOUS
  Filled 2013-04-11 (×8): qty 50

## 2013-04-11 MED ORDER — OMEGA-3-ACID ETHYL ESTERS 1 G PO CAPS
2.0000 g | ORAL_CAPSULE | Freq: Two times a day (BID) | ORAL | Status: DC
  Administered 2013-04-11 – 2013-04-13 (×5): 2 g via ORAL
  Filled 2013-04-11 (×6): qty 2

## 2013-04-11 MED ORDER — GABAPENTIN 300 MG PO CAPS
300.0000 mg | ORAL_CAPSULE | Freq: Three times a day (TID) | ORAL | Status: DC
  Administered 2013-04-11 – 2013-04-13 (×6): 300 mg via ORAL
  Filled 2013-04-11 (×8): qty 1

## 2013-04-11 MED ORDER — LOPERAMIDE HCL 2 MG PO CAPS
4.0000 mg | ORAL_CAPSULE | ORAL | Status: DC | PRN
  Administered 2013-04-11: 4 mg via ORAL
  Filled 2013-04-11: qty 2

## 2013-04-11 MED ORDER — PANTOPRAZOLE SODIUM 40 MG PO TBEC
40.0000 mg | DELAYED_RELEASE_TABLET | Freq: Every day | ORAL | Status: DC
  Administered 2013-04-12 – 2013-04-13 (×2): 40 mg via ORAL
  Filled 2013-04-11 (×2): qty 1

## 2013-04-11 MED ORDER — ASPIRIN-DIPYRIDAMOLE ER 25-200 MG PO CP12
1.0000 | ORAL_CAPSULE | Freq: Two times a day (BID) | ORAL | Status: DC
  Administered 2013-04-11 – 2013-04-13 (×5): 1 via ORAL
  Filled 2013-04-11 (×6): qty 1

## 2013-04-11 MED ORDER — INSULIN ASPART 100 UNIT/ML ~~LOC~~ SOLN: 0.0000 [IU] | Freq: Every day | SUBCUTANEOUS | Status: DC

## 2013-04-11 MED ORDER — INSULIN ASPART 100 UNIT/ML ~~LOC~~ SOLN
0.0000 [IU] | Freq: Three times a day (TID) | SUBCUTANEOUS | Status: DC
  Administered 2013-04-11: 5 [IU] via SUBCUTANEOUS
  Administered 2013-04-12 – 2013-04-13 (×4): 3 [IU] via SUBCUTANEOUS

## 2013-04-11 MED ORDER — LISINOPRIL 2.5 MG PO TABS
2.5000 mg | ORAL_TABLET | Freq: Every day | ORAL | Status: DC
  Administered 2013-04-11 – 2013-04-13 (×3): 2.5 mg via ORAL
  Filled 2013-04-11 (×3): qty 1

## 2013-04-11 MED ORDER — VANCOMYCIN HCL IN DEXTROSE 1-5 GM/200ML-% IV SOLN
1000.0000 mg | Freq: Two times a day (BID) | INTRAVENOUS | Status: DC
  Administered 2013-04-11 – 2013-04-13 (×4): 1000 mg via INTRAVENOUS
  Filled 2013-04-11 (×5): qty 200

## 2013-04-11 MED ORDER — DOCUSATE SODIUM 100 MG PO CAPS: 200.0000 mg | ORAL_CAPSULE | Freq: Two times a day (BID) | ORAL | Status: DC | PRN

## 2013-04-11 NOTE — Progress Notes (Addendum)
[facility] [place]  Subjective  - He feels fine, still small amount of drainage from left groin wound.   Objective 157/76 80 98 F (36.7 C) (Oral) 19 99%  Intake/Output Summary (Last 24 hours) at 04/11/13 0815 Last data filed at 04/11/13 0300  Gross per 24 hour  Intake    770 ml  Output    850 ml  Net    -80 ml    Doppler DP/PT left LE. Min. Edema in foot and ankle. Left groin no erythema, min. Drainage on dressing.  Clean dry dressing applied.   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 I discussed with the patient a right ax fem bypass to save his right leg with removal of the left ax fem and fem fem segment which would most likely result in a left above knee amputation. He is currently stating he would rather bleed to death than lose his leg. He will think about it over the weekend. Continue antibiotics for now    Dry dressing prn to left groin.     Clinton Gallant [facility] 04/11/2013 8:15 AM --  Laboratory Lab Results:  Recent Labs  04/09/13 1643  WBC 6.0  HGB 9.3*  HCT 31.4*  PLT 304   BMET  Recent Labs  04/09/13 1643  NA 137  K 4.7  CL 103  CO2 20  GLUCOSE 232*  BUN 16  CREATININE 1.09  CALCIUM 9.3    COAG Lab Results  Component Value Date   INR 0.91 04/09/2013   INR 1.06 01/02/2013   INR 1.05 01/24/2012   No results found for this basename: PTT   Addendum  I have independently interviewed and examined the patient, and I agree with the physician assistant's findings.  Pt still undecided on desire to proceed with surgical excision of L ax-fem, fem-pop.   He wants to talk with Dr. Darrick Penna on Monday.    Leonides Sake, MD [facility] [place] Office: [phone] Pager: [phone]  04/11/2013, 9:15 AM

## 2013-04-11 NOTE — Consult Note (Addendum)
ANTIBIOTIC CONSULT NOTE - INITIAL  Pharmacy Consult for Vancomycin and Zosyn Indication: left groin infection  Allergies  Allergen Reactions  . Other     Became hypotensive. Pt states allergic to "muscle relaxer" but unsure of name.  . Tape Hives and Itching    medfix non woven tape   Patient Measurements: Height: 5\' 9"  (175.3 cm) Weight: 178 lb 9.2 oz (81 kg) IBW/kg (Calculated) : 70.7  Vital Signs: Temp: 98.3 F (36.8 C) (12/13 1341) Temp src: Oral (12/13 1341) BP: 144/79 mmHg (12/13 1341) Pulse Rate: 90 (12/13 1341) Intake/Output from previous day: 12/12 0701 - 12/13 0700 In: 770 [P.O.:720; IV Piggyback:50] Out: 850 [Urine:850] Intake/Output from this shift: Total I/O In: 480 [P.O.:480] Out: 201 [Urine:200; Stool:1]  Labs:  Recent Labs  04/09/13 1643  WBC 6.0  HGB 9.3*  PLT 304  CREATININE 1.09   Estimated Creatinine Clearance: 64 ml/min (by C-G formula based on Cr of 1.09).   Microbiology: Recent Results (from the past 720 hour(s))  WOUND CULTURE     Status: None   Collection Time    04/10/13  1:40 PM      Result Value Range Status   Specimen Description WOUND LEFT LEG   Final   Special Requests LEFT GROIN INCISION   Final   Gram Stain     Final   Value: RARE WBC PRESENT, PREDOMINANTLY PMN     NO SQUAMOUS EPITHELIAL CELLS SEEN     NO ORGANISMS SEEN     Performed at [facility]   Culture     Final   Value: Culture reincubated for better growth     Performed at [facility]   Report Status PENDING   Incomplete    Medical History: Past Medical History  Diagnosis Date  . GERD (gastroesophageal reflux disease)   . Hyperlipidemia   . Hypertension   . Peripheral vascular disease   . Internal and external bleeding hemorrhoids   . History of gout     left great toe  . Depression   . Numbness in right leg     Hx: of diabetes  . Coronary artery disease     RCA occlusion with good collaterals, o/w no obs CAD 12/2011  . Pneumonia ~  1977  . Type II diabetes mellitus   . History of blood transfusion 12/2011; 12/2012    "before OR; after OR" (01/22/2013)  . Stroke 2008    "left side weak; unable to move left hand still" (01/22/2013)  . Rectal cancer dx'd 08/2007    S/P chemo, radiation, biopsies  . Metastases to the liver dx'd 08/2010   Assessment: 67yom s/p popliteal bypass grafting in September was admitted for possible left groin infection. He was on keflex pta, transitioned to IV ancef here, but groin continues to have drainage so antibiotics will be broadened to vancomycin and zosyn. He may need removal of the left ax fem and fem fem segment which would result in an AKA, but patient is undecided about this. Renal function is stable with sCr 1.09 and CrCl 54ml/min.  12/11 Ancef>> 12/13 12/13 Vancomycin>> 12/13 Zosyn>> 12/12 wound cx>> ngtd 12/13 blood cx x2>> pending  Goal of Therapy:  Vancomycin trough level 10-15 mcg/ml Appropriate zosyn dosing  Plan:  1) Vancomycin 1g IV q12 2) Zosyn 3.375g IV q8 (4 hour infusion) 3) Follow renal function, cultures, LOT, trough at [facility] 04/11/2013,2:42 PM

## 2013-04-12 LAB — CBC
HCT: 26.8 % — ABNORMAL LOW (ref 39.0–52.0)
MCH: 23.7 pg — ABNORMAL LOW (ref 26.0–34.0)
MCHC: 30.2 g/dL (ref 30.0–36.0)
MCV: 78.4 fL (ref 78.0–100.0)
RBC: 3.42 MIL/uL — ABNORMAL LOW (ref 4.22–5.81)
RDW: 15.7 % — ABNORMAL HIGH (ref 11.5–15.5)
WBC: 6.3 10*3/uL (ref 4.0–10.5)

## 2013-04-12 LAB — GLUCOSE, CAPILLARY: Glucose-Capillary: 180 mg/dL — ABNORMAL HIGH (ref 70–99)

## 2013-04-12 LAB — WOUND CULTURE

## 2013-04-12 NOTE — Progress Notes (Signed)
Left groin dressing changed. Small amt tan creamy drainage noted, no odor. Cleaned with sterile saline, dry dressing applied.

## 2013-04-12 NOTE — Progress Notes (Signed)
   Daily Progress Note  Assessment/Planning: S/p L ax-fem BPG, L fem-pop BPG, possible infection of graft   On Zosyn & Vanc   Pt not interested in removal of graft.  He will discuss it further with Dr. Darrick Penna tomorrow  Long-term graft infection suppression likely needed.  ID c/s may be needed.  Subjective    "want to go home"  Objective Filed Vitals:   04/11/13 1341 04/11/13 1947 04/12/13 0501 04/12/13 0535  BP: 144/79 128/64 76/45 115/49  Pulse: 90 81 62 67  Temp: 98.3 F (36.8 C) 97.9 F (36.6 C) 97.7 F (36.5 C)   TempSrc: Oral Oral Oral   Resp: 18 18 18    Height:      Weight:      SpO2: 99% 97% 95%     Intake/Output Summary (Last 24 hours) at 04/12/13 0856 Last data filed at 04/12/13 1610  Gross per 24 hour  Intake 2085.83 ml  Output    876 ml  Net 1209.83 ml    PULM  CTAB CV  RRR GI  soft, NTND VASC  Scant sang. Drainage on dressing, L foot viable  Laboratory CBC    Component Value Date/Time   WBC 6.3 04/12/2013 0435   WBC 6.3 12/01/2012 1008   HGB 8.1* 04/12/2013 0435   HGB 10.0* 12/01/2012 1008   HCT 26.8* 04/12/2013 0435   HCT 31.1* 12/01/2012 1008   PLT 276 04/12/2013 0435   PLT 317 12/01/2012 1008    BMET    Component Value Date/Time   NA 137 04/09/2013 1643   NA 138 12/01/2012 1008   NA 147* 10/12/2011 0959   K 4.7 04/09/2013 1643   K 5.0 12/01/2012 1008   K 5.2* 10/12/2011 0959   CL 103 04/09/2013 1643   CL 102 05/19/2012 1427   CL 102 10/12/2011 0959   CO2 20 04/09/2013 1643   CO2 23 12/01/2012 1008   CO2 29 10/12/2011 0959   GLUCOSE 232* 04/09/2013 1643   GLUCOSE 195* 12/01/2012 1008   GLUCOSE 205* 05/19/2012 1427   GLUCOSE 244* 10/12/2011 0959   BUN 16 04/09/2013 1643   BUN 13.9 12/01/2012 1008   BUN 13 10/12/2011 0959   CREATININE 1.09 04/09/2013 1643   CREATININE 1.4* 12/01/2012 1008   CREATININE 1.0 10/12/2011 0959   CALCIUM 9.3 04/09/2013 1643   CALCIUM 9.9 12/01/2012 1008   CALCIUM 9.0 10/12/2011 0959   GFRNONAA 67* 04/09/2013 1643   GFRAA  78* 04/09/2013 1643    Leonides Sake, MD [facility] [place] Office: [phone] Pager: [phone]  04/12/2013, 8:56 AM

## 2013-04-13 ENCOUNTER — Telehealth: Payer: Self-pay | Admitting: Vascular Surgery

## 2013-04-13 ENCOUNTER — Telehealth: Payer: Self-pay | Admitting: Nurse Practitioner

## 2013-04-13 LAB — GLUCOSE, CAPILLARY: Glucose-Capillary: 160 mg/dL — ABNORMAL HIGH (ref 70–99)

## 2013-04-13 MED ORDER — CEPHALEXIN 500 MG PO CAPS: 500.0000 mg | ORAL_CAPSULE | Freq: Three times a day (TID) | ORAL | Status: DC

## 2013-04-13 MED ORDER — OXYCODONE-ACETAMINOPHEN 5-325 MG PO TABS: 1.0000 | ORAL_TABLET | Freq: Four times a day (QID) | ORAL | Status: DC | PRN

## 2013-04-13 NOTE — Progress Notes (Signed)
DC IV and Tele per protocol and MD orders; discharge instructions given to patient and family; prescriptions given to patient and family; no further questions at this time.  BARNETT, Geroge Baseman

## 2013-04-13 NOTE — Progress Notes (Signed)
[facility] [place]  Subjective  - Wants to go home   Objective 125/46 73 97.7 F (36.5 C) (Oral) 18 95%  Intake/Output Summary (Last 24 hours) at 04/13/13 0931 Last data filed at 04/13/13 0654  Gross per 24 hour  Intake   1985 ml  Output   1000 ml  Net    985 ml   Left groin yellowish drainage, no erythema or fluctuance   Culture negative to date  Assessment/Planning:  Discussion with patient again today that I recommend ligation of ax fem and removal of prosthetic from the left leg.  He has now had several herald bleeds and is at risk for major hemorrhage.  I do not believe this chronic sinus is going to heal.  If the graft is removed he is at very high risk for limb loss and at this point states he would rather die than lose his leg.  Will d/c home on indefinite Keflex.  Follow up in a few weeks.  Pt to come to ER if he experiences bleeding.   Leasa Kincannon E 04/13/2013 9:31 AM --  Laboratory Lab Results:  Recent Labs  04/12/13 0435  WBC 6.3  HGB 8.1*  HCT 26.8*  PLT 276   BMET No results found for this basename: NA, K, CL, CO2, GLUCOSE, BUN, CREATININE, CALCIUM,  in the last 72 hours  COAG Lab Results  Component Value Date   INR 0.91 04/09/2013   INR 1.06 01/02/2013   INR 1.05 01/24/2012   No results found for this basename: PTT

## 2013-04-13 NOTE — Telephone Encounter (Signed)
TC to patient to give recent doppler studies. No answer

## 2013-04-13 NOTE — Telephone Encounter (Addendum)
Message copied by Fredrich Birks on Mon Apr 13, 2013 10:53 AM ------      Message from: [place], [place] M      Created: Mon Apr 13, 2013  9:38 AM       F/U with Dr. Fields Jan 8th for wound check. Left groin s/p by-pass infection on Keflex.   ------  04/13/13: left detailed msg of canceling 12/18 and rescheduling to 05/07/13, dpm

## 2013-04-13 NOTE — Care Management Note (Signed)
    Page 1 of 1   04/13/2013     4:52:17 PM   CARE MANAGEMENT NOTE 04/13/2013  Patient:  Donald [name], Donald [name]   Account Number:  [id number]  Date Initiated:  04/13/2013  Documentation initiated by:  Broedy Osbourne  Subjective/Objective Assessment:   PT ADM WITH INFECTED FEM POP GRAFT ON 12/11.  PTA, PT INDEPENDENT, LIVES WITH WIFE.  PT ACTIVE WITH [facility].     Action/Plan:   PT FOR DC HOME TODAY.  RESUMPTION ORDERS WRITTEN FOR HH CARE.  NOTIFIED [facility] OF DC DATE.   Anticipated DC Date:  04/13/2013   Anticipated DC Plan:  HOME W HOME HEALTH SERVICES      DC Planning Services  CM consult      [facility] Choice  Resumption Of Svcs/PTA Provider   Choice offered to / List presented to:  C-1 Patient        HH arranged  HH-1 RN      [facility] agency  [facility]   Status of service:  Completed, signed off Medicare Important Message given?   (If response is "NO", the following Medicare IM given date fields will be blank) Date Medicare IM given:   Date Additional Medicare IM given:    Discharge Disposition:  HOME W HOME HEALTH SERVICES  Per UR Regulation:  Reviewed for med. necessity/level of care/duration of stay  If discussed at Long Length of Stay Meetings, dates discussed:    Comments:

## 2013-04-13 NOTE — Discharge Summary (Signed)
[facility] Discharge Summary   Patient ID:  Donald [name] MRN: [id number] DOB/AGE: [birth year] 67 y.o.  Admit date: 04/09/2013 Discharge date: 04/13/2013    Admission Diagnosis: Wound infection, initial encounter [958.3]  Discharge Diagnoses:  Wound infection, initial encounter [958.3]  Secondary Diagnoses: Past Medical History  Diagnosis Date  . GERD (gastroesophageal reflux disease)   . Hyperlipidemia   . Hypertension   . Peripheral vascular disease   . Internal and external bleeding hemorrhoids   . History of gout     left great toe  . Depression   . Numbness in right leg     Hx: of diabetes  . Coronary artery disease     RCA occlusion with good collaterals, o/w no obs CAD 12/2011  . Pneumonia ~ 1977  . Type II diabetes mellitus   . History of blood transfusion 12/2011; 12/2012    "before OR; after OR" (01/22/2013)  . Stroke 2008    "left side weak; unable to move left hand still" (01/22/2013)  . Rectal cancer dx'd 08/2007    S/P chemo, radiation, biopsies  . Metastases to the liver dx'd 08/2010      Discharged Condition: good  HPI: Donald [name] is a 67 y.o. male who had a left femoral to below knee popliteal bypass with vein on 01/06/2013 by Dr. Darrick Penna. He has undergone a prior left axillobifemoral bypass on 01/29/12 by Dr. Darrick Penna. The procedure was done for nonhealing wound of his left foot. His graft occluded 4 days postoperatively and he was taken back to the operating room on 01/10/2013. The vein graft could not be successfully opened and he had insertion of a new left femoral to below knee popliteal artery bypass with a 6 mm PTFE graft. He has had a left groin wound and was last seen in the office by Dr. Darrick Penna on 03/19/2013. At that time he had a slowly healing left groin wound it was felt to still be at risk for graft infection. He was started on Keflex. He was to return in 4 weeks.  His wife has been doing dressing changes. Today he she  noted that the dressing (2 - 4X4's) were saturated with blood. hhis wife spoke to the office today and he was sent to the emergency department. He states that there has been no further bleeding from the left groin wound. He denies fever or chills. He remains on Keflex.   ordered a venous duplex scan of the left lower extremity given the left leg swelling, and ordered a CT scan of the pelvis with IV contrast to further evaluate the left groin for abscess or potential graft infection.  He was give both vancomycin and zosyn IV dosed per pharmacy while in the hospital.  Dr. Darrick Penna had a Discussion with patient again today that I recommend ligation of ax fem and removal of prosthetic from the left leg. He has now had several herald bleeds and is at risk for major hemorrhage. I do not believe this chronic sinus is going to heal. If the graft is removed he is at very high risk for limb loss and at this point states he would rather die than lose his leg. Will d/c home on indefinite Keflex. Follow up in a few weeks. Pt to come to ER if he experiences bleeding.  The patient wanted to go home and was discharged with pain medication and Keflex TID.      Hospital Course:  JAQUAIL MCLEES is a  67 y.o. male is S/P Left  Extubated: POD # 0 Physical exam: Left groin yellowish drainage, no erythema or fluctuance Pt. Ambulating, voiding and taking PO diet without difficulty. Pt pain controlled with PO pain meds. Labs as below Complications:none  Consults:     Significant Diagnostic Studies: CBC Lab Results  Component Value Date   WBC 6.3 04/12/2013   HGB 8.1* 04/12/2013   HCT 26.8* 04/12/2013   MCV 78.4 04/12/2013   PLT 276 04/12/2013    BMET    Component Value Date/Time   NA 137 04/09/2013 1643   NA 138 12/01/2012 1008   NA 147* 10/12/2011 0959   K 4.7 04/09/2013 1643   K 5.0 12/01/2012 1008   K 5.2* 10/12/2011 0959   CL 103 04/09/2013 1643   CL 102 05/19/2012 1427   CL 102 10/12/2011 0959   CO2  20 04/09/2013 1643   CO2 23 12/01/2012 1008   CO2 29 10/12/2011 0959   GLUCOSE 232* 04/09/2013 1643   GLUCOSE 195* 12/01/2012 1008   GLUCOSE 205* 05/19/2012 1427   GLUCOSE 244* 10/12/2011 0959   BUN 16 04/09/2013 1643   BUN 13.9 12/01/2012 1008   BUN 13 10/12/2011 0959   CREATININE 1.09 04/09/2013 1643   CREATININE 1.4* 12/01/2012 1008   CREATININE 1.0 10/12/2011 0959   CALCIUM 9.3 04/09/2013 1643   CALCIUM 9.9 12/01/2012 1008   CALCIUM 9.0 10/12/2011 0959   GFRNONAA 67* 04/09/2013 1643   GFRAA 78* 04/09/2013 1643   COAG Lab Results  Component Value Date   INR 0.91 04/09/2013   INR 1.06 01/02/2013   INR 1.05 01/24/2012     Disposition:  Discharge to :Home Discharge Orders   Future Appointments Provider Department Dept Phone   05/04/2013 1:30 PM [facility] Flush Nurse [facility] MED[facility] [phone]   05/07/2013 3:00 PM Sherren Kerns, MD [facility] [phone]   05/15/2013 2:30 PM Nilda Riggs, NP [facility] [phone]   06/05/2013 10:30 AM [facility] Lab 1 [facility] [phone]   06/05/2013 11:00 AM [facility] [phone]   06/05/2013 11:30 AM Wl-Ct 2 [place] [facility] [phone]   Please pick up your oral contrast prep at your scheduled location at least 1 day prior to your appointment. Liquids only 4 hours prior to your exam. Any medications can be taken as usual. Please arrive 15 min prior to your scheduled exam time.   06/09/2013 3:00 PM Benjiman Core, MD [facility] MEDICAL ONCOLOGY [phone]   06/29/2013 1:30 PM [facility] Fl[facility] Nurse [place] [facility]COL[facility] [phone]   Future Orders Complete By Expires   Ambulatory referral to Home Health  As directed    Comments:     Please evaluate DAEMIAN GAHM for admission to [facility].  Resume once a week  RN  Disciplines requested: Nursing  Services to provide: [facility] Care  Physician to follow patient's care (the person listed here will be responsible for signing ongoing orders): Referring Provider  Requested Start of Care Date: Within 2-3 days  Special Instructions:  Dry dressing to left groin check pulses.  S/P infection post-op.   Call MD for:  redness, tenderness, or signs of infection (pain, swelling, bleeding, redness, odor or green/yellow discharge around incision site)  As directed    Call MD for:  severe or increased pain, loss or decreased feeling  in affected limb(s)  As directed    Call MD for:  temperature >100.5  As directed    Discharge instructions  As directed    Comments:     Wash with soap and water daily.  Dry dressing to left groin.  Dry left groin well after bathing.   Increase activity slowly  As directed    Comments:     Walk with assistance use walker or cane as needed   May shower   As directed    Resume previous diet  As directed        Medication List         ALIGN PO  Take 1 tablet by mouth at bedtime.     cephALEXin 500 MG capsule  Commonly known as:  KEFLEX  Take 1 capsule (500 mg total) by mouth 3 (three) times daily.     cephALEXin 500 MG capsule  Commonly known as:  KEFLEX  Take 1 capsule (500 mg total) by mouth 3 (three) times daily.     dipyridamole-aspirin 200-25 MG per 12 hr capsule  Commonly known as:  AGGRENOX  Take 1 capsule by mouth 2 (two) times daily.     docusate sodium 100 MG capsule  Commonly known as:  COLACE  Take 200 mg by mouth 2 (two) times daily as needed for mild constipation.     gabapentin 300 MG capsule  Commonly known as:  NEURONTIN  Take 300 mg by mouth 3 (three) times daily.     lisinopril 5 MG tablet  Commonly known as:  PRINIVIL,ZESTRIL  Take 2.5 mg by mouth daily.     metFORMIN 500 MG tablet  Commonly known as:  GLUCOPHAGE  Take 500 mg by mouth 2 (two) times daily with a meal.     omega-3  acid ethyl esters 1 G capsule  Commonly known as:  LOVAZA  Take 2 g by mouth 2 (two) times daily.     omeprazole 40 MG capsule  Commonly known as:  PRILOSEC  Take 40 mg by mouth daily.     oxyCODONE 5 MG immediate release tablet  Commonly known as:  Oxy IR/ROXICODONE  Take 5 mg by mouth every 4 (four) hours as needed for severe pain.     oxyCODONE-acetaminophen 5-325 MG per tablet  Commonly known as:  PERCOCET/ROXICET  Take 1 tablet by mouth every 6 (six) hours as needed for moderate pain.     vitamin C 1000 MG tablet  Take 1,000 mg by mouth daily.       Verbal and written Discharge instructions given to the patient. Wound care per Discharge AVS     Follow-up Information   Follow up with Sherren Kerns, MD In 2 weeks.   Specialty:  Vascular Surgery   Contact information:   [address] [place] [address] [phone]       Signed: Clinton Gallant [facility] 04/13/2013, 2:50 PM

## 2013-04-13 NOTE — Telephone Encounter (Signed)
TC to patient made aware of carotid doppler studies, need to repeat in 6 months. Had to leave message, call for questions.

## 2013-04-16 ENCOUNTER — Ambulatory Visit: Payer: Medicare Other | Admitting: Vascular Surgery

## 2013-04-17 LAB — CULTURE, BLOOD (ROUTINE X 2)
Culture: NO GROWTH
Culture: NO GROWTH

## 2013-05-04 ENCOUNTER — Other Ambulatory Visit: Payer: Self-pay | Admitting: Neurology

## 2013-05-04 ENCOUNTER — Ambulatory Visit (HOSPITAL_BASED_OUTPATIENT_CLINIC_OR_DEPARTMENT_OTHER): Payer: Medicare Other

## 2013-05-04 VITALS — BP 147/55 | HR 84

## 2013-05-04 DIAGNOSIS — Z452 Encounter for adjustment and management of vascular access device: Secondary | ICD-10-CM

## 2013-05-04 DIAGNOSIS — C787 Secondary malignant neoplasm of liver and intrahepatic bile duct: Secondary | ICD-10-CM

## 2013-05-04 DIAGNOSIS — Z95828 Presence of other vascular implants and grafts: Secondary | ICD-10-CM

## 2013-05-04 DIAGNOSIS — C210 Malignant neoplasm of anus, unspecified: Secondary | ICD-10-CM

## 2013-05-04 MED ORDER — SODIUM CHLORIDE 0.9 % IJ SOLN
10.0000 mL | INTRAMUSCULAR | Status: DC | PRN
  Administered 2013-05-04: 10 mL via INTRAVENOUS
  Filled 2013-05-04: qty 10

## 2013-05-04 MED ORDER — HEPARIN SOD (PORK) LOCK FLUSH 100 UNIT/ML IV SOLN
500.0000 [IU] | Freq: Once | INTRAVENOUS | Status: AC
  Administered 2013-05-04: 500 [IU] via INTRAVENOUS
  Filled 2013-05-04: qty 5

## 2013-05-04 NOTE — Patient Instructions (Signed)
Implanted Port Instructions  An implanted port is a central line that has a round shape and is placed under the skin. It is used for long-term IV (intravenous) access for:  · Medicine.  · Fluids.  · Liquid nutrition, such as TPN (total parenteral nutrition).  · Blood samples.  Ports can be placed:  · In the chest area just below the collarbone (this is the most common place.)  · In the arms.  · In the belly (abdomen) area.  · In the legs.  PARTS OF THE PORT  A port has 2 main parts:  · The reservoir. The reservoir is round, disc-shaped, and will be a small, raised area under your skin.  · The reservoir is the part where a needle is inserted (accessed) to either give medicines or to draw blood.  · The catheter. The catheter is a long, slender tube that extends from the reservoir. The catheter is placed into a large vein.  · Medicine that is inserted into the reservoir goes into the catheter and then into the vein.  INSERTION OF THE PORT  · The port is surgically placed in either an operating room or in a procedural area (interventional radiology).  · Medicine may be given to help you relax during the procedure.  · The skin where the port will be inserted is numbed (local anesthetic).  · 1 or 2 small cuts (incisions) will be made in the skin to insert the port.  · The port can be used after it has been inserted.  INCISION SITE CARE  · The incision site may have small adhesive strips on it. This helps keep the incision site closed. Sometimes, no adhesive strips are placed. Instead of adhesive strips, a special kind of surgical glue is used to keep the incision closed.  · If adhesive strips were placed on the incision sites, do not take them off. They will fall off on their own.  · The incision site may be sore for 1 to 2 days. Pain medicine can help.  · Do not get the incision site wet. Bathe or shower as directed by your caregiver.  · The incision site should heal in 5 to 7 days. A small scar may form after the  incision has healed.  ACCESSING THE PORT  Special steps must be taken to access the port:  · Before the port is accessed, a numbing cream can be placed on the skin. This helps numb the skin over the port site.  · A sterile technique is used to access the port.  · The port is accessed with a needle. Only "non-coring" port needles should be used to access the port. Once the port is accessed, a blood return should be checked. This helps ensure the port is in the vein and is not clogged (clotted).  · If your caregiver believes your port should remain accessed, a clear (transparent) bandage will be placed over the needle site. The bandage and needle will need to be changed every week or as directed by your caregiver.  · Keep the bandage covering the needle clean and dry. Do not get it wet. Follow your caregiver's instructions on how to take a shower or bath when the port is accessed.  · If your port does not need to stay accessed, no bandage is needed over the port.  FLUSHING THE PORT  Flushing the port keeps it from getting clogged. How often the port is flushed depends on:  · If a   constant infusion is running. If a constant infusion is running, the port may not need to be flushed.  · If intermittent medicines are given.  · If the port is not being used.  For intermittent medicines:  · The port will need to be flushed:  · After medicines have been given.  · After blood has been drawn.  · As part of routine maintenance.  · A port is normally flushed with:  · Normal saline.  · Heparin.  · Follow your caregiver's advice on how often, how much, and the type of flush to use on your port.  IMPORTANT PORT INFORMATION  · Tell your caregiver if you are allergic to heparin.  · After your port is placed, you will get a manufacturer's information card. The card has information about your port. Keep this card with you at all times.  · There are many types of ports available. Know what kind of port you have.  · In case of an  emergency, it may be helpful to wear a medical alert bracelet. This can help alert health care workers that you have a port.  · The port can stay in for as long as your caregiver believes it is necessary.  · When it is time for the port to come out, surgery will be done to remove it. The surgery will be similar to how the port was put in.  · If you are in the hospital or clinic:  · Your port will be taken care of and flushed by a nurse.  · If you are at home:  · A home health care nurse may give medicines and take care of the port.  · You or a family member can get special training and directions for giving medicine and taking care of the port at home.  SEEK IMMEDIATE MEDICAL CARE IF:   · Your port does not flush or you are unable to get a blood return.  · New drainage or pus is coming from the incision.  · A bad smell is coming from the incision site.  · You develop swelling or increased redness at the incision site.  · You develop increased swelling or pain at the port site.  · You develop swelling or pain in the surrounding skin near the port.  · You have an oral temperature above 102° F (38.9° C), not controlled by medicine.  MAKE SURE YOU:   · Understand these instructions.  · Will watch your condition.  · Will get help right away if you are not doing well or get worse.  Document Released: 04/16/2005 Document Revised: 07/09/2011 Document Reviewed: 07/08/2008  [employer]® Patient Information ©2014 [employer].

## 2013-05-06 ENCOUNTER — Encounter: Payer: Self-pay | Admitting: Vascular Surgery

## 2013-05-07 ENCOUNTER — Encounter: Payer: Self-pay | Admitting: Vascular Surgery

## 2013-05-07 ENCOUNTER — Ambulatory Visit (INDEPENDENT_AMBULATORY_CARE_PROVIDER_SITE_OTHER): Payer: Medicare Other | Admitting: Vascular Surgery

## 2013-05-07 VITALS — BP 160/67 | HR 80 | Ht 69.0 in | Wt 183.0 lb

## 2013-05-07 DIAGNOSIS — I739 Peripheral vascular disease, unspecified: Secondary | ICD-10-CM

## 2013-05-07 NOTE — Progress Notes (Signed)
[facility] OFFICE NOTE  CC:  F/u for surgery  HPI:  This is a 68 y.o. male who is s/p left femoral to popliteal bypass grafting with PTFE on 01/10/13.  He has had prior axbifem. The most recent operation was done for a nonhealing wound of his left first toe. He had breakdown of his left groin and has been using hydrogel.   he was admitted to the hospital recently with a bleeding episode from the left groin. At that time I offered him removal of the graft. However he wished to continue local wound care. Surprisingly, the wound has continued to heal and is now only a small sinus tract in the left groin. He denies any significant drainage. He denies any further bleeding episodes. He denies rest pain in the feet. He believes the ulceration at the tip of his left first toe is continuing to heal. This has a small amount of drainage daily.Donald [name]  He states he has been doing well.    The ulcer on the tip of his right first toe is healed.         ROS:  no fever or chills, no chest pain or shortness of breath  Current Outpatient Prescriptions on File Prior to Visit  Medication Sig Dispense Refill  . AGGRENOX 25-200 MG per 12 hr capsule TAKE 1 CAPSULE BY MOUTH TWICE DAILY  180 capsule  0  . Ascorbic Acid (VITAMIN C) 1000 MG tablet Take 1,000 mg by mouth daily.      . cephALEXin (KEFLEX) 500 MG capsule Take 1 capsule (500 mg total) by mouth 3 (three) times daily.  42 capsule  0  . cephALEXin (KEFLEX) 500 MG capsule Take 1 capsule (500 mg total) by mouth 3 (three) times daily.  90 capsule  2  . dipyridamole-aspirin (AGGRENOX) 200-25 MG per 12 hr capsule Take 1 capsule by mouth 2 (two) times daily.      Donald [name] docusate sodium (COLACE) 100 MG capsule Take 200 mg by mouth 2 (two) times daily as needed for mild constipation.      . gabapentin (NEURONTIN) 300 MG capsule Take 300 mg by mouth 3 (three) times daily.      Donald [name] lisinopril (PRINIVIL,ZESTRIL) 5 MG tablet Take 2.5 mg by mouth daily.      . metFORMIN  (GLUCOPHAGE) 500 MG tablet Take 500 mg by mouth 2 (two) times daily with a meal.      . omega-3 acid ethyl esters (LOVAZA) 1 G capsule Take 2 g by mouth 2 (two) times daily.       Donald [name] omeprazole (PRILOSEC) 40 MG capsule Take 40 mg by mouth daily.      Donald [name] oxyCODONE (OXY IR/ROXICODONE) 5 MG immediate release tablet Take 5 mg by mouth every 4 (four) hours as needed for severe pain.      Donald [name] oxyCODONE-acetaminophen (PERCOCET/ROXICET) 5-325 MG per tablet Take 1 tablet by mouth every 6 (six) hours as needed for moderate pain.  30 tablet  0  . Probiotic Product (ALIGN PO) Take 1 tablet by mouth at bedtime.       No current facility-administered medications on file prior to visit.    Physical Exam:          Filed Vitals:   05/07/13 1510  BP: 160/67  Pulse: 80  Height: 5\' 9"  (1.753 m)  Weight: 183 lb (83.008 kg)  SpO2: 100%    Incision:  Left groin healed with small open area mid incision.  the  wound opening in the left groin is now 1 mm in diameter. There is a small amount of serous drainage.Some granulation tissue was beaten back today with silver nitrate   Extremities:  No drainage from left great toe expressible which is gangrenous at the tip. The dry eschar was trimmed back today. Overall this appears dry   right toe ulcer completely healed.  A/P:  slowly healing left groin wound still at risk for graft infection, now with a sinus tract -continue hydrogel to left groin wound.  -will have pt return in 4 weeks for a wound check -dry dressing to left great toe -pt will contact us if wounds worsen    He is still a very high risk for graft infection and bilateral limb loss.  Ruta Hinds, MD [facility] Office: [phone] Pager: [phone]

## 2013-05-15 ENCOUNTER — Ambulatory Visit (INDEPENDENT_AMBULATORY_CARE_PROVIDER_SITE_OTHER): Payer: Medicare Other | Admitting: Nurse Practitioner

## 2013-05-15 ENCOUNTER — Encounter: Payer: Self-pay | Admitting: Nurse Practitioner

## 2013-05-15 VITALS — BP 134/61 | HR 92 | Ht 69.0 in | Wt 183.0 lb

## 2013-05-15 DIAGNOSIS — I69959 Hemiplegia and hemiparesis following unspecified cerebrovascular disease affecting unspecified side: Secondary | ICD-10-CM

## 2013-05-15 DIAGNOSIS — R269 Unspecified abnormalities of gait and mobility: Secondary | ICD-10-CM

## 2013-05-15 DIAGNOSIS — Z8679 Personal history of other diseases of the circulatory system: Secondary | ICD-10-CM

## 2013-05-15 DIAGNOSIS — I69354 Hemiplegia and hemiparesis following cerebral infarction affecting left non-dominant side: Secondary | ICD-10-CM

## 2013-05-15 MED ORDER — GABAPENTIN 300 MG PO CAPS: 300.0000 mg | ORAL_CAPSULE | Freq: Three times a day (TID) | ORAL | Status: AC

## 2013-05-15 NOTE — Progress Notes (Signed)
[facility]  PATIENT: Donald [name] DOB: 06-17-1945   REASON FOR VISIT: Followup for remote history of stroke and residual left hemiparesis    HISTORY OF PRESENT ILLNESS:Donald [name], 68 year old returns for followup. He was last seen in this office on 01/05/2013. On 01/06/2013 he had surgery for left below-knee popliteal bypass for nonhealing foot wound. He then developed an infection  and is continuing to get wound  care by a home health agency. He denies further stroke or TIA symptoms. Repeat carotid Doppler done 04/08/2013 was without change from previous. He denies any falls, he uses a single-point cane. He remains on Aggrenox with minimal bruising. He returns for reevaluation.   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 He also has a history of left foot pain with baseline peripheral neuropathy due to his diabetes. He has had increasing weakness in the right leg following a femoral popliteal bypass several months ago, he is currently on Neurontin which helps with his lower extremity paresthesias. He is due for vascular surgery tomorrow on the left leg. His blood pressure is fairly well controlled. His fasting sugars are up and down. His physical and occupational therapy has concluded and he is not doing home exercise .He has not had further stroke or TIA symptoms. He is using a single-point cane, he denies any recent falls.   REVIEW OF SYSTEMS: Full 14 system review of systems performed and notable only for those listed, all others are neg:  Constitutional: N/A  Cardiovascular: N/A  Ear/Nose/Throat: N/A  Skin: N/A  Eyes: N/A  Respiratory: N/A  Gastroitestinal: N/A  Hematology/Lymphatic: Easy bruising  Endocrine: N/A Musculoskeletal:N/A  Allergy/Immunology: N/A  Neurological: N/A Psychiatric: N/A   ALLERGIES: Allergies  Allergen Reactions  . Other     Became hypotensive. Pt states  allergic to "muscle relaxer" but unsure of name.  . Tape Hives and Itching    medfix non woven tape    HOME MEDICATIONS: Outpatient Prescriptions Prior to Visit  Medication Sig Dispense Refill  . AGGRENOX 25-200 MG per 12 hr capsule TAKE 1 CAPSULE BY MOUTH TWICE DAILY  180 capsule  0  . Ascorbic Acid (VITAMIN C) 1000 MG tablet Take 1,000 mg by mouth daily.      . cephALEXin (KEFLEX) 500 MG capsule Take 1 capsule (500 mg total) by mouth 3 (three) times daily.  42 capsule  0  . cephALEXin (KEFLEX) 500 MG capsule Take 1 capsule (500 mg total) by mouth 3 (three) times daily.  90 capsule  2  . dipyridamole-aspirin (AGGRENOX) 200-25 MG per 12 hr capsule Take 1 capsule by mouth 2 (two) times daily.      Marland Kitchen docusate sodium (COLACE) 100 MG capsule Take 200 mg by mouth 2 (two) times daily as needed for mild constipation.      . gabapentin (NEURONTIN) 300 MG capsule Take 300 mg by mouth 3 (three) times daily.      Marland Kitchen lisinopril (PRINIVIL,ZESTRIL) 5 MG tablet Take 2.5 mg by mouth daily.      . metFORMIN (GLUCOPHAGE) 500 MG tablet Take 500 mg by mouth 2 (two) times daily with a meal.      . omega-3 acid ethyl esters (LOVAZA) 1 G capsule Take 2 g by mouth 2 (two) times daily.       Marland Kitchen omeprazole (PRILOSEC) 40 MG capsule Take 40 mg by mouth daily.      . Probiotic Product (ALIGN PO)  Take 1 tablet by mouth at bedtime.      Marland Kitchen oxyCODONE (OXY IR/ROXICODONE) 5 MG immediate release tablet Take 5 mg by mouth every 4 (four) hours as needed for severe pain.      Marland Kitchen oxyCODONE-acetaminophen (PERCOCET/ROXICET) 5-325 MG per tablet Take 1 tablet by mouth every 6 (six) hours as needed for moderate pain.  30 tablet  0   No facility-administered medications prior to visit.    PAST MEDICAL HISTORY: Past Medical History  Diagnosis Date  . GERD (gastroesophageal reflux disease)   . Hyperlipidemia   . Hypertension   . Peripheral vascular disease   . Internal and external bleeding hemorrhoids   . History of gout     left  great toe  . Depression   . Numbness in right leg     Hx: of diabetes  . Coronary artery disease     RCA occlusion with good collaterals, o/w no obs CAD 12/2011  . Pneumonia ~ 1977  . Type II diabetes mellitus   . History of blood transfusion 12/2011; 12/2012    "before OR; after OR" (01/22/2013)  . Stroke 2008    "left side weak; unable to move left hand still" (01/22/2013)  . Rectal cancer dx'd 08/2007    S/P chemo, radiation, biopsies  . Metastases to the liver dx'd 08/2010    PAST SURGICAL HISTORY: Past Surgical History  Procedure Laterality Date  . Carotid endarterectomy Right 2008  . Liver cryoablation  08/2010    "chemo shrunk the cancer 50% then they went in and burned the rest of it"  . Axillary-femoral bypass graft  01/29/2012    Procedure: BYPASS GRAFT AXILLA-BIFEMORAL;  Surgeon: Elam Dutch, MD;  Location: [facility];  Service: Vascular;  Laterality: N/A;  Left axilla-bifemoral bypass using gore-tex graft.   . Colonoscopy w/ biopsies and polypectomy      Hx: of  . Femoral-popliteal bypass graft Left 01/06/2013    Procedure: Femoral-Popliteal Artery Bypass;  Surgeon: Elam Dutch, MD;  Location: [facility];  Service: Vascular;  Laterality: Left;  . Intraoperative arteriogram Left 01/06/2013    Procedure: INTRA OPERATIVE ARTERIOGRAM;  Surgeon: Elam Dutch, MD;  Location: [facility];  Service: Vascular;  Laterality: Left;  . Femoral-popliteal bypass graft Left 01/10/2013    Procedure: THROMBECTOMY POSSIBLE BYPASS GRAFT FEMORAL-POPLITEAL ARTERY;  Surgeon: Mal Misty, MD;  Location: [facility];  Service: Vascular;  Laterality: Left;  Attempted Thrombectomy of left Femoral/Popliteal graft - Unsuccessful.  Insertion of Left femoral to below the knee popliteal propaten graft.  . Mass biopsy  2009    "found mass that was cancer; later had chemo and radiation; never cut mass out" (01/22/2013)    FAMILY HISTORY: Family History  Problem Relation Age of Onset  . Cancer - Other Sister      SOCIAL HISTORY: History   Social History  . Marital Status: Married    Spouse Name: Bethena Roys    Number of Children: 0  . Years of Education: 12   Occupational History  . Retired - [employer]     2004   Social History Main Topics  . Smoking status: Former Smoker -- 0.10 packs/day for 57 years    Types: Cigarettes  . Smokeless tobacco: Never Used     Comment: pt states that he has only smoked 1/2 cig since 02/06/2013  . Alcohol Use: No  . Drug Use: No     Comment: 01/22/2013 "used marijuana in the 1960's"  . Sexual  Activity: Not Currently   Other Topics Concern  . Not on file   Social History Narrative   2 siblings, no known CAD. No children.   Patient is married to Obert.    Patient has 12 grade education.    Patient is retired.    Patient is right-handed.      PHYSICAL EXAM  Filed Vitals:   05/15/13 1432  BP: 134/61  Pulse: 92  Height: 5\' 9"  (1.753 m)  Weight: 183 lb (83.008 kg)   Body mass index is 27.01 kg/(m^2).  Generalized: Well developed, in no acute distress  Head: normocephalic and atraumatic,. Oropharynx benign  Neck: Supple, no carotid bruits  Cardiac: Regular rate rhythm,  Musculoskeletal: Left hemiparesis  Neurological examination   Mentation: Alert oriented to time, place, history taking. Follows all commands speech and language fluent  Cranial nerve II-XII: Pupils were equal round reactive to light extraocular movements were full, visual field were full on confrontational test. Facial  strength with mild left lower weakness  hearing was intact to finger rubbing bilaterally. Uvula tongue midline. head turning and shoulder shrug were normal and symmetric.Tongue protrusion into cheek strength was normal. Motor: Left hemiparesis with 2/5 in the shoulder, 0/5 left hand, 4/5 left leg with diminished finger movements on the left  Sensory: Intact to touch, pinprick and vibratory in lower extremities Coordination: finger-nose-finger, heel-to-shin  bilaterally, no dysmetria, unable to perform on the left Reflexes: Brachioradialis 2/2, biceps 2/2, triceps 2/2, patellar 2/2, Achilles 2/2, plantar responses were flexor bilaterally. Gait and Station: Rising up from seated position without assistance, stiffness of the left leg, left foot drop evident, ambulates with a cane, tandem gait not attempted  DIAGNOSTIC DATA (LABS, IMAGING, TESTING) - I reviewed patient records, labs, notes, testing and imaging myself where available.  Lab Results  Component Value Date   WBC 6.3 04/12/2013   HGB 8.1* 04/12/2013   HCT 26.8* 04/12/2013   MCV 78.4 04/12/2013   PLT 276 04/12/2013      Component Value Date/Time   NA 137 04/09/2013 1643   NA 138 12/01/2012 1008   NA 147* 10/12/2011 0959   K 4.7 04/09/2013 1643   K 5.0 12/01/2012 1008   K 5.2* 10/12/2011 0959   CL 103 04/09/2013 1643   CL 102 05/19/2012 1427   CL 102 10/12/2011 0959   CO2 20 04/09/2013 1643   CO2 23 12/01/2012 1008   CO2 29 10/12/2011 0959   GLUCOSE 232* 04/09/2013 1643   GLUCOSE 195* 12/01/2012 1008   GLUCOSE 205* 05/19/2012 1427   GLUCOSE 244* 10/12/2011 0959   BUN 16 04/09/2013 1643   BUN 13.9 12/01/2012 1008   BUN 13 10/12/2011 0959   CREATININE 1.09 04/09/2013 1643   CREATININE 1.4* 12/01/2012 1008   CREATININE 1.0 10/12/2011 0959   CALCIUM 9.3 04/09/2013 1643   CALCIUM 9.9 12/01/2012 1008   CALCIUM 9.0 10/12/2011 0959   PROT 7.1 03/06/2013 1730   PROT 7.9 12/01/2012 1008   PROT 7.6 10/12/2011 0959   ALBUMIN 3.0* 03/06/2013 1730   ALBUMIN 3.6 12/01/2012 1008   AST 32 03/06/2013 1730   AST 37* 12/01/2012 1008   AST 51* 10/12/2011 0959   ALT 20 03/06/2013 1730   ALT 31 12/01/2012 1008   ALT 45 10/12/2011 0959   ALKPHOS 99 03/06/2013 1730   ALKPHOS 85 12/01/2012 1008   ALKPHOS 90* 10/12/2011 0959   BILITOT 0.1* 03/06/2013 1730   BILITOT 0.36 12/01/2012 1008   BILITOT 0.50 10/12/2011 0959  GFRNONAA 67* 04/09/2013 1643   GFRAA 78* 04/09/2013 1643   Lab Results  Component Value Date   CHOL 145  01/25/2012   HDL 23* 01/25/2012   LDLCALC 91 01/25/2012   TRIG 153* 01/25/2012   CHOLHDL 6.3 01/25/2012   Lab Results  Component Value Date   HGBA1C 8.4* 04/12/2013      ASSESSMENT AND PLAN  68 y.o. year old male  has a past medical history of  Hyperlipidemia; Hypertension; Peripheral vascular disease; Depression; Numbness in right leg; Coronary artery disease; Pneumonia (~ 1977); Type II diabetes mellitus;  Stroke (2008);  here to followup. The paresthesias are likely due to diabetic neuropathy, reviewed recent labs Carotid Doppler 04/08/2013 is without change from previous  Continue Aggrenox for secondary stroke prevention Continue gabapentin or paresthesias Control blood pressure with systolic below 9/47 LDL cholesterol below 100 Carotid Doppler to be repeated at next visit F/U 6 months Dennie Bible, [facility], [facility], [facility] [address] [place], [place] [address] [phone]

## 2013-05-15 NOTE — Patient Instructions (Signed)
Continue Aggrenox for secondary stroke prevention Continue gabapentin or paresthesias Control blood pressure with systolic below 6/82 LDL cholesterol below 100 Carotid Doppler to be repeated at next visit F/U 6 months

## 2013-06-05 ENCOUNTER — Ambulatory Visit (HOSPITAL_COMMUNITY)
Admission: RE | Admit: 2013-06-05 | Discharge: 2013-06-05 | Disposition: A | Discharge status: Home / Self Care | Payer: Medicare Other | Source: Ambulatory Visit | Attending: Oncology | Admitting: Oncology

## 2013-06-05 ENCOUNTER — Other Ambulatory Visit (HOSPITAL_BASED_OUTPATIENT_CLINIC_OR_DEPARTMENT_OTHER): Payer: Medicare Other

## 2013-06-05 ENCOUNTER — Encounter (HOSPITAL_COMMUNITY): Payer: Self-pay

## 2013-06-05 DIAGNOSIS — K769 Liver disease, unspecified: Secondary | ICD-10-CM | POA: Insufficient documentation

## 2013-06-05 DIAGNOSIS — C210 Malignant neoplasm of anus, unspecified: Secondary | ICD-10-CM

## 2013-06-05 DIAGNOSIS — Z9221 Personal history of antineoplastic chemotherapy: Secondary | ICD-10-CM | POA: Insufficient documentation

## 2013-06-05 DIAGNOSIS — I745 Embolism and thrombosis of iliac artery: Secondary | ICD-10-CM | POA: Insufficient documentation

## 2013-06-05 DIAGNOSIS — I70209 Unspecified atherosclerosis of native arteries of extremities, unspecified extremity: Secondary | ICD-10-CM | POA: Insufficient documentation

## 2013-06-05 DIAGNOSIS — I77811 Abdominal aortic ectasia: Secondary | ICD-10-CM | POA: Insufficient documentation

## 2013-06-05 DIAGNOSIS — I7090 Unspecified atherosclerosis: Secondary | ICD-10-CM | POA: Insufficient documentation

## 2013-06-05 DIAGNOSIS — C20 Malignant neoplasm of rectum: Secondary | ICD-10-CM | POA: Insufficient documentation

## 2013-06-05 DIAGNOSIS — I5189 Other ill-defined heart diseases: Secondary | ICD-10-CM | POA: Insufficient documentation

## 2013-06-05 LAB — CBC WITH DIFFERENTIAL/PLATELET
BASO%: 1.9 % (ref 0.0–2.0)
BASOS ABS: 0.1 10*3/uL (ref 0.0–0.1)
EOS ABS: 0.1 10*3/uL (ref 0.0–0.5)
EOS%: 1.1 % (ref 0.0–7.0)
HCT: 30.8 % — ABNORMAL LOW (ref 38.4–49.9)
HEMOGLOBIN: 9.3 g/dL — AB (ref 13.0–17.1)
LYMPH%: 22.1 % (ref 14.0–49.0)
MCH: 22 pg — ABNORMAL LOW (ref 27.2–33.4)
MCHC: 30.2 g/dL — ABNORMAL LOW (ref 32.0–36.0)
MCV: 72.9 fL — ABNORMAL LOW (ref 79.3–98.0)
MONO#: 0.5 10*3/uL (ref 0.1–0.9)
MONO%: 7.4 % (ref 0.0–14.0)
NEUT%: 67.5 % (ref 39.0–75.0)
NEUTROS ABS: 4.8 10*3/uL (ref 1.5–6.5)
PLATELETS: 324 10*3/uL (ref 140–400)
RBC: 4.23 10*6/uL (ref 4.20–5.82)
RDW: 16.9 % — AB (ref 11.0–14.6)
WBC: 7.1 10*3/uL (ref 4.0–10.3)
lymph#: 1.6 10*3/uL (ref 0.9–3.3)

## 2013-06-05 LAB — COMPREHENSIVE METABOLIC PANEL (CC13)
ALK PHOS: 112 U/L (ref 40–150)
ALT: 26 U/L (ref 0–55)
AST: 32 U/L (ref 5–34)
Albumin: 3.9 g/dL (ref 3.5–5.0)
Anion Gap: 11 mEq/L (ref 3–11)
BILIRUBIN TOTAL: 0.37 mg/dL (ref 0.20–1.20)
BUN: 19.5 mg/dL (ref 7.0–26.0)
CO2: 23 mEq/L (ref 22–29)
Calcium: 10.1 mg/dL (ref 8.4–10.4)
Chloride: 103 mEq/L (ref 98–109)
Creatinine: 1.4 mg/dL — ABNORMAL HIGH (ref 0.7–1.3)
GLUCOSE: 239 mg/dL — AB (ref 70–140)
Potassium: 5.4 mEq/L — ABNORMAL HIGH (ref 3.5–5.1)
Sodium: 137 mEq/L (ref 136–145)
Total Protein: 7.9 g/dL (ref 6.4–8.3)

## 2013-06-05 MED ORDER — IOHEXOL 300 MG/ML  SOLN
100.0000 mL | Freq: Once | INTRAMUSCULAR | Status: AC | PRN
  Administered 2013-06-05: 100 mL via INTRAVENOUS

## 2013-06-09 ENCOUNTER — Encounter: Payer: Self-pay | Admitting: Oncology

## 2013-06-09 ENCOUNTER — Telehealth: Payer: Self-pay | Admitting: Oncology

## 2013-06-09 ENCOUNTER — Ambulatory Visit (HOSPITAL_BASED_OUTPATIENT_CLINIC_OR_DEPARTMENT_OTHER): Payer: Medicare Other | Admitting: Oncology

## 2013-06-09 ENCOUNTER — Encounter (INDEPENDENT_AMBULATORY_CARE_PROVIDER_SITE_OTHER): Payer: Self-pay

## 2013-06-09 VITALS — BP 155/64 | HR 85 | Temp 97.0°F | Resp 18 | Ht 69.0 in | Wt 187.6 lb

## 2013-06-09 DIAGNOSIS — Z8673 Personal history of transient ischemic attack (TIA), and cerebral infarction without residual deficits: Secondary | ICD-10-CM

## 2013-06-09 DIAGNOSIS — E119 Type 2 diabetes mellitus without complications: Secondary | ICD-10-CM

## 2013-06-09 DIAGNOSIS — C210 Malignant neoplasm of anus, unspecified: Secondary | ICD-10-CM

## 2013-06-09 DIAGNOSIS — C787 Secondary malignant neoplasm of liver and intrahepatic bile duct: Secondary | ICD-10-CM

## 2013-06-09 NOTE — Progress Notes (Signed)
Hematology and Oncology Follow Up Visit  Donald [name] [id number] 09-Nov-1945 68 y.o. 06/09/2013 2:58 PM Maggie Font, MDHill, Berneta Sages, MD   Principle Diagnosis: 68 year old with anal cancer with liver metastases diagnosed in 2012. Tumor was poorly differentiated and had a solitary liver met. He has stage IV NED.   Prior Therapy: 1. S/P 3 cycles of Cisplatin and 5-FU 08/2010 through 10/2010. 2. S/P percutaneous microwave ablation to the liver met in 01/2011.  Current therapy: Watchful observation  Interim History:  Donald [name] is seen today for routine follow-up with his wife. Since his last visit, he continues to improve slowly from as vascular surgery. Still having lower extremity weakness. He is able to ambulate with the assistance of a cane. He had a CVA in 2008 and has residual left arm hemiparesis. Reports appetite is good. Weight is up by compared to last visit 6 months ago. Denies abdominal pain, nausea, vomiting. No bleeding noted. No change in his bowel or bladder habits.  No new hospitalizations or illnesses. He has not reported any abdominal pain or discomfort. He reports no hematochezia or melena.  Medications: I have reviewed the patient's current medications.  Current Outpatient Prescriptions  Medication Sig Dispense Refill  . AGGRENOX 25-200 MG per 12 hr capsule TAKE 1 CAPSULE BY MOUTH TWICE DAILY  180 capsule  0  . Ascorbic Acid (VITAMIN C) 1000 MG tablet Take 1,000 mg by mouth daily.      . cephALEXin (KEFLEX) 500 MG capsule Take 1 capsule (500 mg total) by mouth 3 (three) times daily.  90 capsule  2  . docusate sodium (COLACE) 100 MG capsule Take 200 mg by mouth 2 (two) times daily as needed for mild constipation.      . gabapentin (NEURONTIN) 300 MG capsule Take 1 capsule (300 mg total) by mouth 3 (three) times daily.  90 capsule  6  . lisinopril (PRINIVIL,ZESTRIL) 5 MG tablet Take 2.5 mg by mouth daily.      . metFORMIN (GLUCOPHAGE) 500 MG tablet Take 500 mg by mouth 2  (two) times daily with a meal.      . omega-3 acid ethyl esters (LOVAZA) 1 G capsule Take 2 g by mouth 2 (two) times daily.       Marland Kitchen omeprazole (PRILOSEC) 40 MG capsule Take 40 mg by mouth daily.      . Probiotic Product (ALIGN PO) Take 1 tablet by mouth at bedtime.       No current facility-administered medications for this visit.    Allergies:  Allergies  Allergen Reactions  . Other     Became hypotensive. Pt states allergic to "muscle relaxer" but unsure of name.  . Tape Hives and Itching    medfix non woven tape    Past Medical History, Surgical history, Social history, and Family History were reviewed and updated.  Review of Systems: Constitutional:  Negative for fever, chills, night sweats, anorexia, weight loss, pain. Cardiovascular: no chest pain or dyspnea on exertion Respiratory: no cough, shortness of breath, or wheezing Neurological: no TIA or stroke symptoms Dermatological: negative ENT: negative Skin: Negative. Gastrointestinal: no abdominal pain, change in bowel habits, or black or bloody stools Genito-Urinary: no dysuria, trouble voiding, or hematuria Hematological and Lymphatic: negative Breast: negative for breast lumps Musculoskeletal: positive for - gait disturbance Remaining ROS negative.  Physical Exam: Blood pressure 155/64, pulse 85, temperature 97 F (36.1 C), temperature source Oral, resp. rate 18, height $RemoveBe'5\' 9"'bfQJmtkYF$  (1.753 m), weight 187 lb 9.6  oz (85.095 kg), SpO2 95.00%. ECOG: 1 General appearance: alert, cooperative and no distress Head: Normocephalic, without obvious abnormality, atraumatic Neck: no adenopathy, no carotid bruit, no JVD, supple, symmetrical, trachea midline and thyroid not enlarged, symmetric, no tenderness/mass/nodules Lymph nodes: Cervical, supraclavicular, and axillary nodes normal. Heart:regular rate and rhythm, S1, S2 normal, no murmur, click, rub or gallop Lung:chest clear, no wheezing, rales, normal symmetric air entry, no  tachypnea, retractions or cyanosis Abdomen: soft, non-tender, without masses or organomegaly EXT:no erythema, induration, or nodules   Lab Results: Lab Results  Component Value Date   WBC 7.1 06/05/2013   HGB 9.3* 06/05/2013   HCT 30.8* 06/05/2013   MCV 72.9* 06/05/2013   PLT 324 06/05/2013     Chemistry      Component Value Date/Time   NA 137 06/05/2013 1010   NA 137 04/09/2013 1643   NA 147* 10/12/2011 0959   K 5.4* 06/05/2013 1010   K 4.7 04/09/2013 1643   K 5.2* 10/12/2011 0959   CL 103 04/09/2013 1643   CL 102 05/19/2012 1427   CL 102 10/12/2011 0959   CO2 23 06/05/2013 1010   CO2 20 04/09/2013 1643   CO2 29 10/12/2011 0959   BUN 19.5 06/05/2013 1010   BUN 16 04/09/2013 1643   BUN 13 10/12/2011 0959   CREATININE 1.4* 06/05/2013 1010   CREATININE 1.09 04/09/2013 1643   CREATININE 1.0 10/12/2011 0959      Component Value Date/Time   CALCIUM 10.1 06/05/2013 1010   CALCIUM 9.3 04/09/2013 1643   CALCIUM 9.0 10/12/2011 0959   ALKPHOS 112 06/05/2013 1010   ALKPHOS 99 03/06/2013 1730   ALKPHOS 90* 10/12/2011 0959   AST 32 06/05/2013 1010   AST 32 03/06/2013 1730   AST 51* 10/12/2011 0959   ALT 26 06/05/2013 1010   ALT 20 03/06/2013 1730   ALT 45 10/12/2011 0959   BILITOT 0.37 06/05/2013 1010   BILITOT 0.1* 03/06/2013 1730   BILITOT 0.50 10/12/2011 0959      CLINICAL DATA: History of anal carcinoma with liver metastatic  disease, diagnosed in May 2009, treated with surgery, chemotherapy  and liver ablation.  EXAM:  CT ABDOMEN AND PELVIS WITH CONTRAST  TECHNIQUE:  Multidetector CT imaging of the abdomen and pelvis was performed  using the standard protocol following bolus administration of  intravenous contrast.  CONTRAST: 176mL OMNIPAQUE IOHEXOL 300 MG/ML SOLN  COMPARISON: 04/09/2013, 01/09/2013.  FINDINGS:  No lung base nodules. Minimal dependent subsegmental atelectasis.  Small irregular hypo attenuating lesion at the dome of the liver  measuring 18 mm. This is without significant change. No  other liver  lesions.  Normal spleen and pancreas. No bile duct dilation. There are stable  gallstones. No acute cholecystitis. No adrenal masses.  Kidneys are unremarkable. Normal ureters. The bladder wall appears  mildly thickened diffusely, although this may at least in part be  due to lack of bladder distention. No bladder mass is seen.  No pathologically enlarged lymph nodes. No abnormal fluid  collections.  No bowel masses seen. The fat planes between the rectum, prostate  and seminal vesicles are relatively well preserved. No bowel wall  thickening or mesenteric inflammation seen. A normal appendix is  visualized.  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 There is  significant atherosclerotic plaque and mural thrombus. The left  common iliac artery is occluded. There is a by femoral graft across  the low anterior abdominal wall, which is patent. There is a left  superficial femoral artery graft. The right superficial femoral  artery is occluded. There is also and axillary to femoral graft on  the left, which is patent.  No osteoblastic or osteolytic lesions.  IMPRESSION:  1. No evidence of locally recurrent anal carcinoma. No evidence of  active metastatic disease. No acute findings.  2. Stable irregular hypo attenuating lesion at the dome of the  liver, which represents a previously ablated hepatic metastatic  lesion. This is smaller than it was from the CT dated 10/12/2011.  3. Bladder wall appears mildly thickened. Consider cystitis if there  are symptoms consistent with this. No bladder mass.  4. Significant vascular disease with changes from previous vascular  tertiary and graft placement.       Impression and Plan: This is a 68 year old gentleman with the following issues:  1. Metastatic anal cancer. S/P chemotherapy followed by microwave ablation to a solitary liver met. Labs, and CT scan from 06/05/2013 reviewed with the patient today. Discussed that  scans remains stable without evidence of recurrent disease at this time. Recommend continued observation. He will have a CT scan in 12 months.  2. PAC. I have set him up for flushes avery 6-8 weeks. He expressed interest in keeping his Port-A-Cath as it has helped with IV access due to his health issues.  3. Hx of CVA in 2008. Has residual left hemiparesis. He is followed by Neurology.   4. DM. On Metformin per PCP.  5. Follow-up. In 6 months.     FKCLEX,NTZGY 2/10/20152:58 PM

## 2013-06-09 NOTE — Telephone Encounter (Signed)
Gave pt appt for lab,md and flush until August 2015 °

## 2013-06-10 ENCOUNTER — Encounter: Payer: Self-pay | Admitting: Vascular Surgery

## 2013-06-11 ENCOUNTER — Encounter: Payer: Self-pay | Admitting: Vascular Surgery

## 2013-06-11 ENCOUNTER — Ambulatory Visit (INDEPENDENT_AMBULATORY_CARE_PROVIDER_SITE_OTHER): Payer: Medicare Other | Admitting: Vascular Surgery

## 2013-06-11 VITALS — BP 164/64 | HR 56 | Ht 69.0 in | Wt 185.0 lb

## 2013-06-11 DIAGNOSIS — Z48812 Encounter for surgical aftercare following surgery on the circulatory system: Secondary | ICD-10-CM

## 2013-06-11 DIAGNOSIS — I739 Peripheral vascular disease, unspecified: Secondary | ICD-10-CM

## 2013-06-11 NOTE — Progress Notes (Signed)
[facility] OFFICE NOTE  CC:  F/u for surgery  HPI:  This is a 68 y.o. male who is s/p left femoral to popliteal bypass grafting with PTFE on 01/10/13.  He has had prior axbifem. The most recent operation was done for a nonhealing wound of his left first toe. I offered him removal of the graft. However he wished to continue local wound care. Surprisingly, the wound has continued to heal and is now only a small sinus tract in the left groin. He denies any significant drainage. He denies any further bleeding episodes. He denies rest pain in the feet. He believes the ulceration at the tip of his left first toe is continuing to heal.   He states he has been doing well.    The ulcer on the tip of his right first toe is healed.         ROS:  no fever or chills, no chest pain or shortness of breath    Current Outpatient Prescriptions on File Prior to Visit   Medication  Sig  Dispense  Refill   .  AGGRENOX 25-200 MG per 12 hr capsule  TAKE 1 CAPSULE BY MOUTH TWICE DAILY   180 capsule   0   .  Ascorbic Acid (VITAMIN C) 1000 MG tablet  Take 1,000 mg by mouth daily.         .  cephALEXin (KEFLEX) 500 MG capsule  Take 1 capsule (500 mg total) by mouth 3 (three) times daily.   42 capsule   0   .  cephALEXin (KEFLEX) 500 MG capsule  Take 1 capsule (500 mg total) by mouth 3 (three) times daily.   90 capsule   2   .  dipyridamole-aspirin (AGGRENOX) 200-25 MG per 12 hr capsule  Take 1 capsule by mouth 2 (two) times daily.         Marland Kitchen  docusate sodium (COLACE) 100 MG capsule  Take 200 mg by mouth 2 (two) times daily as needed for mild constipation.         .  gabapentin (NEURONTIN) 300 MG capsule  Take 300 mg by mouth 3 (three) times daily.         Marland Kitchen  lisinopril (PRINIVIL,ZESTRIL) 5 MG tablet  Take 2.5 mg by mouth daily.         .  metFORMIN (GLUCOPHAGE) 500 MG tablet  Take 500 mg by mouth 2 (two) times daily with a meal.         .  omega-3 acid ethyl esters (LOVAZA) 1 G capsule  Take 2 g by mouth 2  (two) times daily.          Marland Kitchen  omeprazole (PRILOSEC) 40 MG capsule  Take 40 mg by mouth daily.         Marland Kitchen  oxyCODONE (OXY IR/ROXICODONE) 5 MG immediate release tablet  Take 5 mg by mouth every 4 (four) hours as needed for severe pain.         Marland Kitchen  oxyCODONE-acetaminophen (PERCOCET/ROXICET) 5-325 MG per tablet  Take 1 tablet by mouth every 6 (six) hours as needed for moderate pain.   30 tablet   0   .  Probiotic Product (ALIGN PO)  Take 1 tablet by mouth at bedtime.            No current facility-administered medications on file prior to visit.     Physical Exam:          Filed Vitals:  06/11/13 1342  BP: 164/64  Pulse: 56  Height: 5\' 9"  (1.753 m)  Weight: 185 lb (83.915 kg)  SpO2: 98%    Incision:  Left groin healed with small open area mid incision.  the wound opening in the left groin is now 1 mm in diameter. There is a small amount of serous drainage.Some granulation tissue was beaten back today with silver nitrate   Extremities:  No drainage from left great toe expressible which is gangrenous at the tip. . Overall this appears dry   right toe ulcer completely healed.  A/P:  slowly healing left groin wound still at risk for graft infection, now with a sinus tract -continue hydrogel to left groin wound.  -will have pt return in 6 months for a wound check ABI and arterial duplex -pt will contact us if wounds worsen    He is still a very high risk for graft infection and bilateral limb loss.  Ruta Hinds, MD [facility] Office: [phone] Pager: [phone]

## 2013-06-12 NOTE — Addendum Note (Signed)
Addended by: Dorthula Rue L on: 06/12/2013 03:28 PM   Modules accepted: Orders

## 2013-07-28 ENCOUNTER — Other Ambulatory Visit: Payer: Self-pay | Admitting: Neurology

## 2013-07-30 ENCOUNTER — Telehealth: Payer: Self-pay | Admitting: Internal Medicine

## 2013-07-30 NOTE — Telephone Encounter (Signed)
Pt states that he started having bad rectal pain Sunday and it got worse Monday. States he can hardly stand it when he has a bowel movement. Saw his PCP today and the doctor tried to do a rectal exam and pt states he almost came off of the table. Pt states he has not seen any bleeding and he is taking stool softeners. Pt states PCP wanted him to be seen asap. Pt scheduled to see Amy Esterwood PA 08/04/13@3pm . Pt aware of appt.

## 2013-08-04 ENCOUNTER — Telehealth: Payer: Self-pay | Admitting: *Deleted

## 2013-08-04 ENCOUNTER — Ambulatory Visit (INDEPENDENT_AMBULATORY_CARE_PROVIDER_SITE_OTHER): Payer: Medicare Other | Admitting: Physician Assistant

## 2013-08-04 ENCOUNTER — Encounter: Payer: Self-pay | Admitting: Physician Assistant

## 2013-08-04 VITALS — BP 142/68 | HR 76 | Ht 69.0 in | Wt 183.0 lb

## 2013-08-04 DIAGNOSIS — K6289 Other specified diseases of anus and rectum: Secondary | ICD-10-CM

## 2013-08-04 DIAGNOSIS — Z85038 Personal history of other malignant neoplasm of large intestine: Secondary | ICD-10-CM

## 2013-08-04 MED ORDER — MOVIPREP 100 G PO SOLR: 1.0000 | ORAL | Status: DC

## 2013-08-04 MED ORDER — MESALAMINE 1000 MG RE SUPP: 1000.0000 mg | Freq: Every day | RECTAL | Status: DC

## 2013-08-04 NOTE — Telephone Encounter (Signed)
Okay to hold Aggrenox 3 days prior to the procedure and to resume it after the procedure with a small but acceptable risk of TIA/stroke during the peri-procedure period

## 2013-08-04 NOTE — Patient Instructions (Addendum)
We sent prescriptions to [facility]. 1. Moviprep for the colonoscopy 2. Canasa suppositories. Use 1 suppository at bedtime.    Get over the counter Recticare with Lidocaine to use 4-5 times daily for rectal pain.  We will call you once we hear from Dr. Leonie Man regarding the Aggrenox blood thinner.   Try to soak in a warm tub with warm water and Epsom salts several times a day.  You have been scheduled for a colonoscopy with propofol. Please follow written instructions given to you at your visit today.  Please pick up your prep kit at the pharmacy within the next 1-3 days. If you use inhalers (even only as needed), please bring them with you on the day of your procedure.

## 2013-08-04 NOTE — Telephone Encounter (Signed)
08/04/2013   RE: Donald [name] DOB: Feb 29, 1946 MRN: [id number]   Dear Dr. Suzzanne Cloud,    We have scheduled the above patient for an endoscopic procedure. Our records show that he is on anticoagulation therapy.   Please advise as to how long the patient may come off his therapy of Aggrenox prior to the procedure, which is scheduled for 09-16-2013.  Please fax back/ or route the completed form to [facility] at [phone].   Sincerely,    Amy Esterwood PA-C    [name] NCMA

## 2013-08-04 NOTE — Progress Notes (Signed)
Agree with initial assessment and plan. Suspect fissure based on history. Should not have pain with radiation proctitis. Agree with symptomatic therapies and plans for colonoscopy

## 2013-08-04 NOTE — Progress Notes (Signed)
Subjective:    Patient ID: Donald Donald [name], Donald [name]    DOB: 11/05/1945, 68 y.o.   MRN: [id number]  HPI Donald Donald [name] known previously to Dr. Henrene Pastor. 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 He is followed by Dr. Alen Blew.Marland Kitchen He underwent initial chemotherapy and radiation to the rectum and then had an ablation to the liver metastases. He last had CT scan of the abdomen and pelvis in February of 2015 with no evidence of local recurrence and no evidence of active metastatic disease- he had a stable irregular hypoattenuating lesion at the dome of the liver representing the previously  ablated hepatic metastatic lesion. Patient has had evidence of radiation proctitis and underwent a flexible sigmoidoscopy in 2012 while he was hospitalized and was found to have radiation proctitis at that time. He was treated with Canasa suppositories. He also has history of a CVA with left hemiparesis in 2008 and is maintained on Aggrenox. He is a diabetic with history of peripheral vascular disease and has chronic constipation. He comes in today with complaints of severe rectal pain. He says he had onset about a week ago. At that time he had been somewhat constipated and was noticing flat ribbonlike stools. He says since he's had some good bowel movements and is no longer seen flat stools. However he did develop excruciating anal rectal pain with bowel movements. He has not had any bleeding. No fever or chills no nausea or vomiting and no abdominal pain. He says he is uncomfortable to sit and describes the pain as a throbbing type of pain which is intense with and just after bowel movements.    Review of Systems  Constitutional: Negative.   HENT: Negative.   Respiratory: Negative.   Cardiovascular: Negative.   Gastrointestinal: Positive for constipation and rectal pain.  Endocrine: Negative.   Genitourinary:  Negative.   Musculoskeletal: Positive for gait problem.  Skin: Negative.   Allergic/Immunologic: Negative.   Neurological: Negative.   Hematological: Negative.   Psychiatric/Behavioral: Negative.    Outpatient Prescriptions Prior to Visit  Medication Sig Dispense Refill  . AGGRENOX 25-200 MG per 12 hr capsule TAKE ONE CAPSULE BY MOUTH TWICE DAILY  180 capsule  1  . Ascorbic Acid (VITAMIN C) 1000 MG tablet Take 500 mg by mouth daily.       . cephALEXin (KEFLEX) 500 MG capsule Take 1 capsule (500 mg total) by mouth 3 (three) times daily.  90 capsule  2  . docusate sodium (COLACE) 100 MG capsule Take 200 mg by mouth 2 (two) times daily as needed for mild constipation.      . gabapentin (NEURONTIN) 300 MG capsule Take 1 capsule (300 mg total) by mouth 3 (three) times daily.  90 capsule  6  . lisinopril (PRINIVIL,ZESTRIL) 5 MG tablet Take 2.5 mg by mouth daily.      . metFORMIN (GLUCOPHAGE) 500 MG tablet Take 500 mg by mouth 2 (two) times daily with a meal.      . omega-3 acid ethyl esters (LOVAZA) 1 G capsule Take 2 g by mouth 2 (two) times daily.       Marland Kitchen omeprazole (PRILOSEC) 40 MG capsule Take 40 mg by mouth daily.      . Probiotic Product (ALIGN PO) Take 1 tablet by mouth at bedtime.       No facility-administered medications prior to visit.   Allergies  Allergen Reactions  . Other     Became hypotensive. Pt states allergic to "muscle relaxer" but unsure of name.  . Tape Hives and Itching    medfix non woven tape   Patient Active Problem List   Diagnosis Date Noted  . Wound discharge 04/09/2013  . PVD (peripheral vascular disease) with claudication 01/14/2013  . Hemiparesis affecting left side as late effect of stroke 01/05/2013  . Abnormality of gait 09/03/2012  . Other acquired deformity of ankle and foot(736.79) 09/03/2012  . Aftercare following surgery of the circulatory system, [place] 05/15/2012  . 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 Peripheral vascular disease, unspecified 03/06/2012  . PVD (peripheral vascular disease) 02/21/2012  . Occlusion and stenosis of carotid artery without mention of cerebral infarction 02/21/2012  . Physical deconditioning 01/31/2012  . Atherosclerotic peripheral vascular disease with rest pain 01/24/2012  . Preoperative evaluation to rule out surgical contraindication 01/24/2012  . Anal carcinoma 10/30/2011  . Anal cancer 02/26/2011  . [place] MALIGNANT NEOPLASM OF LARGE INTESTINE&RECTUM 11/09/2008  . NAUSEA AND VOMITING 11/09/2008  . CONSTIPATION 08/18/2008  . RECTAL BLEEDING 08/18/2008  . ABDOMINAL PAIN -GENERALIZED 08/18/2008  . DM 08/17/2008  . HYPERLIPIDEMIA 08/17/2008  . HEMORRHOIDS 08/17/2008  . CONSTIPATION, CHRONIC 08/17/2008  . CEREBROVASCULAR ACCIDENT, HX OF 08/17/2008   History  Substance Use Topics  . Smoking status: Former Smoker -- 0.10 packs/day for 57 years    Types: Cigarettes  . Smokeless tobacco: Never Used     Comment: pt states that he has only smoked 1/2 cig since 02/06/2013  . Alcohol Use: No   family history includes Cancer - Other in his sister.     Objective:   Physical Exam  well-developed older white Donald [name] in no acute distress, accompanied by his wife blood pressure 142/68 pulse 76 height 5 foot 9 weight 183. HEENT; nontraumatic normocephalic EOMI PERRLA sclera anicteric, Supple; no JVD, Cardiovascular; regular rate and rhythm with S1-S2 no murmur or gallop, Pulmonary; clear, Abdomen; soft, no focal tenderness, no guarding, or rebound no palpable mass or hepatosplenomegaly he has had a bypass graft with healing incision in the left groin, Rectal; exam small external hemorrhoid, nonthrombosed, no external evidence for fistula or abscess is exquisitely tender with digital exam of the anus there is no impaction no definite fissure felt, on anoscopy he does have inflammatory changes and prominent vasculature in the rectum no lesion seen, Extremities;  no clubbing cyanosis or edema, neuro/ psych affect is appropriate he has had a prior CVA with left hemiparesis        Assessment & Plan:  #41  68 year old Donald [name] with severe anorectal pain x1 week. On anoscopy he does have evidence of probable radiation proctitis and no I cannot feel a fissure his history is most consistent with an anal fissure. #2 history of squamous cell carcinoma of the anus 2010 with a solitary metastases to the liver. Patient is status post chemotherapy radiation and ablation to the liver metastasis with no evidence for recurrent disease as of CT February 2015 #3 chronic anti- platelet therapy with Aggrenox #4 history of CVA with left hemiparesis 2008 #5 peripheral vascular disease #6 adult-onset diabetes mellitus  Plan; sitz baths for 15-20 minutes at a time 2 or 3 times daily over the next week Moistened wipes Start Canasa suppositories to thousand milligrams at bedtime nightly x1 month Recta care/lidocaine cream 2% applied 3-4 times daily as needed for rectal pain Continue daily stool softener and  probiotic Schedule for colonoscopy with Dr. Henrene Pastor. Patient had flexible sigmoidoscopy 2012, last full colonoscopy 2010. Will schedule a colonoscopy 5-6 weeks out to allow time for improvement in his acute fissure/rectal pain. Will obtain consent from Dr. Theresia Lo the patient's neurologist for him to hold Aggrenox for 5-7 days prior to the procedure.

## 2013-08-05 NOTE — Telephone Encounter (Signed)
I spoke to Donald [name] and took the message for her husband.  I advised that he does not need to take the Aggrenox on 5-17,18,19,and the 20th.  Per Dr. Leonie Man he can resume it after the procedure with a small but acceptable risk of TIA/stroke during the peri-procedure period.

## 2013-08-06 ENCOUNTER — Other Ambulatory Visit: Payer: Self-pay

## 2013-08-07 ENCOUNTER — Ambulatory Visit (HOSPITAL_BASED_OUTPATIENT_CLINIC_OR_DEPARTMENT_OTHER): Payer: Medicare Other

## 2013-08-07 VITALS — BP 137/44 | HR 91

## 2013-08-07 DIAGNOSIS — C210 Malignant neoplasm of anus, unspecified: Secondary | ICD-10-CM

## 2013-08-07 DIAGNOSIS — Z95828 Presence of other vascular implants and grafts: Secondary | ICD-10-CM

## 2013-08-07 DIAGNOSIS — C787 Secondary malignant neoplasm of liver and intrahepatic bile duct: Secondary | ICD-10-CM

## 2013-08-07 DIAGNOSIS — Z452 Encounter for adjustment and management of vascular access device: Secondary | ICD-10-CM

## 2013-08-07 MED ORDER — HEPARIN SOD (PORK) LOCK FLUSH 100 UNIT/ML IV SOLN
500.0000 [IU] | Freq: Once | INTRAVENOUS | Status: AC
  Administered 2013-08-07: 500 [IU] via INTRAVENOUS
  Filled 2013-08-07: qty 5

## 2013-08-07 MED ORDER — SODIUM CHLORIDE 0.9 % IJ SOLN
10.0000 mL | INTRAMUSCULAR | Status: DC | PRN
  Administered 2013-08-07: 10 mL via INTRAVENOUS
  Filled 2013-08-07: qty 10

## 2013-08-07 NOTE — Patient Instructions (Signed)

## 2013-08-10 ENCOUNTER — Telehealth: Payer: Self-pay | Admitting: *Deleted

## 2013-08-10 NOTE — Telephone Encounter (Signed)
I called the patient to give him his instructions for the aggrenox and he told me that his rectal pain really hurts and he has pressure lower abdominal area.  He has been doing the sitz baths, the canasa suppository at bedtime and the recticare. I advised him I will speak to [facility] PA and call him back.

## 2013-08-10 NOTE — Telephone Encounter (Signed)
I told Donald [name] to continue the Sitz baths several times a day and use the lidocaine cream and continue the Canasa suppositories at bedtime.  I told him that per Amy, it takes some times for the fissure to heal.  I told him to call me on Thursday the 17th with an update.

## 2013-08-10 NOTE — Telephone Encounter (Signed)
Message copied by Tonette Bihari on Mon Aug 10, 2013  8:18 AM ------      Message from: Irene Shipper      Created: Thu Aug 06, 2013  1:01 PM      Regarding: Do not hold Aggrenox       Donald [name], patient to have colonoscopy in May. Has had previous stroke. Due NOT hold Aggrenox. We were previously seeking approval from the neurologist. However, do NOT hold Aggrenox. Please let the patient know. Convert this to a phone note for the permanent record. Thanks ------

## 2013-08-10 NOTE — Telephone Encounter (Signed)
Needs to continue sitz baths and use lidocaine cream several times per day- takes time for a fissure to heal- happy to give him some ultram for pain ,but may make him constipated- if he would like pain med- Ok to give Ultram 50 mg q6-8 hours prn. #30 /0 refills

## 2013-08-10 NOTE — Telephone Encounter (Signed)
See phone note from 08-10-2013 regarding the Aggrenox.

## 2013-08-10 NOTE — Telephone Encounter (Signed)
Advised the patient that he is to stay on the Aggrenox per Dr. Henrene Pastor. I told him we will call him if there are any changes in these instructions before the procedure on 09-16-2013.

## 2013-09-04 ENCOUNTER — Emergency Department (HOSPITAL_COMMUNITY)
Admission: EM | Admit: 2013-09-04 | Discharge: 2013-09-04 | Disposition: A | Discharge status: Home / Self Care | Payer: Medicare Other | Source: Home / Self Care | Attending: Emergency Medicine | Admitting: Emergency Medicine

## 2013-09-04 ENCOUNTER — Encounter (HOSPITAL_COMMUNITY): Payer: Self-pay | Admitting: Emergency Medicine

## 2013-09-04 ENCOUNTER — Emergency Department (HOSPITAL_COMMUNITY): Payer: Medicare Other

## 2013-09-04 DIAGNOSIS — Y92009 Unspecified place in unspecified non-institutional (private) residence as the place of occurrence of the external cause: Secondary | ICD-10-CM

## 2013-09-04 DIAGNOSIS — W1809XA Striking against other object with subsequent fall, initial encounter: Secondary | ICD-10-CM | POA: Insufficient documentation

## 2013-09-04 DIAGNOSIS — M79609 Pain in unspecified limb: Secondary | ICD-10-CM | POA: Diagnosis not present

## 2013-09-04 DIAGNOSIS — D649 Anemia, unspecified: Secondary | ICD-10-CM

## 2013-09-04 DIAGNOSIS — Z8673 Personal history of transient ischemic attack (TIA), and cerebral infarction without residual deficits: Secondary | ICD-10-CM | POA: Insufficient documentation

## 2013-09-04 DIAGNOSIS — Z87891 Personal history of nicotine dependence: Secondary | ICD-10-CM

## 2013-09-04 DIAGNOSIS — Z8659 Personal history of other mental and behavioral disorders: Secondary | ICD-10-CM | POA: Insufficient documentation

## 2013-09-04 DIAGNOSIS — S300XXA Contusion of lower back and pelvis, initial encounter: Secondary | ICD-10-CM | POA: Insufficient documentation

## 2013-09-04 DIAGNOSIS — Z792 Long term (current) use of antibiotics: Secondary | ICD-10-CM | POA: Insufficient documentation

## 2013-09-04 DIAGNOSIS — Z85048 Personal history of other malignant neoplasm of rectum, rectosigmoid junction, and anus: Secondary | ICD-10-CM | POA: Insufficient documentation

## 2013-09-04 DIAGNOSIS — Z8505 Personal history of malignant neoplasm of liver: Secondary | ICD-10-CM | POA: Insufficient documentation

## 2013-09-04 DIAGNOSIS — Y9389 Activity, other specified: Secondary | ICD-10-CM | POA: Insufficient documentation

## 2013-09-04 DIAGNOSIS — I10 Essential (primary) hypertension: Secondary | ICD-10-CM

## 2013-09-04 DIAGNOSIS — S7002XA Contusion of left hip, initial encounter: Secondary | ICD-10-CM

## 2013-09-04 DIAGNOSIS — I2510 Atherosclerotic heart disease of native coronary artery without angina pectoris: Secondary | ICD-10-CM

## 2013-09-04 DIAGNOSIS — Z8701 Personal history of pneumonia (recurrent): Secondary | ICD-10-CM | POA: Insufficient documentation

## 2013-09-04 DIAGNOSIS — Z79899 Other long term (current) drug therapy: Secondary | ICD-10-CM

## 2013-09-04 DIAGNOSIS — K219 Gastro-esophageal reflux disease without esophagitis: Secondary | ICD-10-CM | POA: Insufficient documentation

## 2013-09-04 DIAGNOSIS — S7000XA Contusion of unspecified hip, initial encounter: Secondary | ICD-10-CM | POA: Insufficient documentation

## 2013-09-04 DIAGNOSIS — E119 Type 2 diabetes mellitus without complications: Secondary | ICD-10-CM

## 2013-09-04 DIAGNOSIS — T82898A Other specified complication of vascular prosthetic devices, implants and grafts, initial encounter: Secondary | ICD-10-CM | POA: Diagnosis not present

## 2013-09-04 LAB — BASIC METABOLIC PANEL
BUN: 18 mg/dL (ref 6–23)
CHLORIDE: 102 meq/L (ref 96–112)
CO2: 19 mEq/L (ref 19–32)
Calcium: 9 mg/dL (ref 8.4–10.5)
Creatinine, Ser: 1.2 mg/dL (ref 0.50–1.35)
GFR calc Af Amer: 69 mL/min — ABNORMAL LOW (ref >90)
GFR calc non Af Amer: 60 mL/min — ABNORMAL LOW (ref >90)
GLUCOSE: 232 mg/dL — AB (ref 70–99)
Potassium: 4.2 mEq/L (ref 3.7–5.3)
SODIUM: 138 meq/L (ref 137–147)

## 2013-09-04 LAB — CBC WITH DIFFERENTIAL/PLATELET
BASOS PCT: 1 % (ref 0–1)
Basophils Absolute: 0.1 10*3/uL (ref 0.0–0.1)
Eosinophils Absolute: 0.3 10*3/uL (ref 0.0–0.7)
Eosinophils Relative: 3 % (ref 0–5)
HEMATOCRIT: 26.9 % — AB (ref 39.0–52.0)
HEMOGLOBIN: 7.5 g/dL — AB (ref 13.0–17.0)
Lymphocytes Relative: 20 % (ref 12–46)
Lymphs Abs: 1.8 10*3/uL (ref 0.7–4.0)
MCH: 19.9 pg — AB (ref 26.0–34.0)
MCHC: 27.9 g/dL — ABNORMAL LOW (ref 30.0–36.0)
MCV: 71.4 fL — ABNORMAL LOW (ref 78.0–100.0)
MONO ABS: 0.6 10*3/uL (ref 0.1–1.0)
Monocytes Relative: 7 % (ref 3–12)
NEUTROS ABS: 6.3 10*3/uL (ref 1.7–7.7)
Neutrophils Relative %: 69 % (ref 43–77)
Platelets: 300 10*3/uL (ref 150–400)
RBC: 3.77 MIL/uL — AB (ref 4.22–5.81)
RDW: 17.4 % — ABNORMAL HIGH (ref 11.5–15.5)
WBC: 9.1 10*3/uL (ref 4.0–10.5)

## 2013-09-04 LAB — PROTIME-INR
INR: 1.01 (ref 0.00–1.49)
Prothrombin Time: 13.1 seconds (ref 11.6–15.2)

## 2013-09-04 MED ORDER — OXYCODONE-ACETAMINOPHEN 5-325 MG PO TABS: 1.0000 | ORAL_TABLET | ORAL | Status: DC | PRN

## 2013-09-04 MED ORDER — FENTANYL CITRATE 0.05 MG/ML IJ SOLN
50.0000 ug | Freq: Once | INTRAMUSCULAR | Status: AC
  Administered 2013-09-04: 50 ug via INTRAVENOUS
  Filled 2013-09-04: qty 2

## 2013-09-04 MED ORDER — OXYCODONE-ACETAMINOPHEN 5-325 MG PO TABS
2.0000 | ORAL_TABLET | Freq: Once | ORAL | Status: AC
  Administered 2013-09-04: 2 via ORAL
  Filled 2013-09-04: qty 2

## 2013-09-04 NOTE — ED Notes (Signed)
Pt arrives by EMS from home. Pt has hx of stroke with left sided deficits present. Pt stumbled on carpeted flooring. Pt states he has pain to left hip radiating down leg. Pt was able to get up with wifes assistance. Denies blood thinners. Denies neck and back pain. 150/76 HR 70. Pt took darvocet before coming to ED.

## 2013-09-04 NOTE — ED Provider Notes (Signed)
CSN: [id number]     Arrival date & time 09/04/13  2105 History   First MD Initiated Contact with Patient 09/04/13 2121     Chief Complaint  Patient presents with  . Fall     (Consider location/radiation/quality/duration/timing/severity/associated sxs/prior Treatment) HPI Comments: The patient is a 69 year old male with a history of stroke affecting the left side of his body (walks with a cocaine). He presents with a complaint of left hip pain after falling at home. He states that he lost his balance when he was trying to get up to grab his cane. When he fell he struck his left hip, he has been unable to ambulate since that time and required paramedic transport for further treatment and evaluation. The patient is on Aggrenox. He denies head injury and has had no chest pain shortness of breath fevers chills nausea or vomiting. 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 This hip injury occurred a short while ago, is persistent, it is worse with range of motion  Patient is a 68 y.o. male presenting with fall. The history is provided by the patient.  Fall    Past Medical History  Diagnosis Date  . GERD (gastroesophageal reflux disease)   . Hyperlipidemia   . Hypertension   . Peripheral vascular disease   . Internal and external bleeding hemorrhoids   . History of gout     left great toe  . Depression   . Numbness in right leg     Hx: of diabetes  . Coronary artery disease     RCA occlusion with good collaterals, o/w no obs CAD 12/2011  . Pneumonia ~ 1977  . Type II diabetes mellitus   . History of blood transfusion 12/2011; 12/2012    "before OR; after OR" (01/22/2013)  . Stroke 2008    "left side weak; unable to move left hand still" (01/22/2013)  . Rectal cancer dx'd 08/2007    S/P chemo, radiation, biopsies  . Metastases to the liver dx'd 08/2010   Past Surgical History  Procedure  Laterality Date  . Carotid endarterectomy Right 2008  . Liver cryoablation  08/2010    "chemo shrunk the cancer 50% then they went in and burned the rest of it"  . Axillary-femoral bypass graft  01/29/2012    Procedure: BYPASS GRAFT AXILLA-BIFEMORAL;  Surgeon: Elam Dutch, MD;  Location: [facility] OR;  Service: Vascular;  Laterality: N/A;  Left axilla-bifemoral bypass using gore-tex graft.   . Colonoscopy w/ biopsies and polypectomy      Hx: of  . Femoral-popliteal bypass graft Left 01/06/2013    Procedure: Femoral-Popliteal Artery Bypass;  Surgeon: Elam Dutch, MD;  Location: [place];  Service: Vascular;  Laterality: Left;  . Intraoperative arteriogram Left 01/06/2013    Procedure: INTRA OPERATIVE ARTERIOGRAM;  Surgeon: Elam Dutch, MD;  Location: [place];  Service: Vascular;  Laterality: Left;  . Femoral-popliteal bypass graft Left 01/10/2013    Procedure: THROMBECTOMY POSSIBLE BYPASS GRAFT FEMORAL-POPLITEAL ARTERY;  Surgeon: Mal Misty, MD;  Location: [place];  Service: Vascular;  Laterality: Left;  Attempted Thrombectomy of left Femoral/Popliteal graft - Unsuccessful.  Insertion of Left femoral to below the knee popliteal propaten graft.  . Mass biopsy  2009    "found mass that was cancer; later had chemo and radiation; never cut mass out" (01/22/2013)   Family History  Problem  Relation Age of Onset  . Cancer - Other Sister    History  Substance Use Topics  . Smoking status: Former Smoker -- 0.10 packs/day for 57 years    Types: Cigarettes  . Smokeless tobacco: Never Used     Comment: pt states that he has only smoked 1/2 cig since 02/06/2013  . Alcohol Use: No    Review of Systems  All other systems reviewed and are negative.     Allergies  Other and Tape  Home Medications   Prior to Admission medications   Medication Sig Start Date End Date Taking? Authorizing Provider  AGGRENOX 25-200 MG per 12 hr capsule TAKE ONE CAPSULE BY MOUTH TWICE DAILY 07/28/13   Garvin Fila,  MD  Ascorbic Acid (VITAMIN C) 1000 MG tablet Take 500 mg by mouth daily.     Historical Provider, MD  cephALEXin (KEFLEX) 500 MG capsule Take 1 capsule (500 mg total) by mouth 3 (three) times daily. 04/13/13   Ulyses Amor, PA-C  docusate sodium (COLACE) 100 MG capsule Take 200 mg by mouth 2 (two) times daily as needed for mild constipation.    Historical Provider, MD  gabapentin (NEURONTIN) 300 MG capsule Take 1 capsule (300 mg total) by mouth 3 (three) times daily. 05/15/13   Dennie Bible, NP  lisinopril (PRINIVIL,ZESTRIL) 5 MG tablet Take 2.5 mg by mouth daily.    Historical Provider, MD  mesalamine (CANASA) 1000 MG suppository Place 1 suppository (1,000 mg total) rectally at bedtime. 08/04/13   Amy S Esterwood, PA-C  metFORMIN (GLUCOPHAGE) 500 MG tablet Take 500 mg by mouth 2 (two) times daily with a meal.    Historical Provider, MD  MOVIPREP 100 G SOLR Take 1 kit (200 g total) by mouth as directed. 08/04/13   Amy S Esterwood, PA-C  omega-3 acid ethyl esters (LOVAZA) 1 G capsule Take 2 g by mouth 2 (two) times daily.     Historical Provider, MD  omeprazole (PRILOSEC) 40 MG capsule Take 40 mg by mouth daily.    Historical Provider, MD  oxyCODONE-acetaminophen (PERCOCET/ROXICET) 5-325 MG per tablet Take 1 tablet by mouth every 6 (six) hours as needed for severe pain.    Historical Provider, MD  Probiotic Product (ALIGN PO) Take 1 tablet by mouth at bedtime.    Historical Provider, MD   BP 178/36  Pulse 69  Temp(Src) 97.7 F (36.5 C) (Oral)  Resp 16  Ht _0  (1.753 m)  Wt 182 lb (82.555 kg)  BMI 26.86 kg/m2  SpO2 98% Physical Exam  Nursing note and vitals reviewed. Constitutional: He appears well-developed and well-nourished. No distress.  HENT:  Head: Normocephalic and atraumatic.  Mouth/Throat: Oropharynx is clear and moist. No oropharyngeal exudate.  Eyes: Conjunctivae and EOM are normal. Pupils are equal, round, and reactive to light. Right eye exhibits no discharge. Left eye  exhibits no discharge. No scleral icterus.  Neck: Normal range of motion. Neck supple. No JVD present. No thyromegaly present.  Cardiovascular: Normal rate, regular rhythm, normal heart sounds and intact distal pulses.  Exam reveals no gallop and no friction rub.   No murmur heard. Pulmonary/Chest: Effort normal and breath sounds normal. No respiratory distress. He has no wheezes. He has no rales.  Abdominal: Soft. Bowel sounds are normal. He exhibits no distension and no mass. There is no tenderness.  Musculoskeletal: He exhibits tenderness. He exhibits no edema.  Decreased range of motion of the left hip secondary to pain, hip and lower surely  slightly shortened, edema on the left present compared to the right, patient states this is chronic secondary to vascular procedures of vascular bypass of the left lower extremity. Able to move all other extremities without difficulty.  Lymphadenopathy:    He has no cervical adenopathy.  Neurological: He is alert. Coordination normal.  Skin: Skin is warm and dry. No rash noted. No erythema.  Psychiatric: He has a normal mood and affect. His behavior is normal.    ED Course  Procedures (including critical care time) Labs Review Labs Reviewed  BASIC METABOLIC PANEL - Abnormal; Notable for the following:    Glucose, Bld 232 (*)    GFR calc non Af Amer 60 (*)    GFR calc Af Amer 69 (*)    All other components within normal limits  CBC WITH DIFFERENTIAL - Abnormal; Notable for the following:    RBC 3.77 (*)    Hemoglobin 7.5 (*)    HCT 26.9 (*)    MCV 71.4 (*)    MCH 19.9 (*)    MCHC 27.9 (*)    RDW 17.4 (*)    All other components within normal limits  PROTIME-INR  TYPE AND SCREEN    Imaging Review Dg Chest 1 View  09/04/2013   CLINICAL DATA:  Fall.  EXAM: CHEST - 1 VIEW  COMPARISON:  06/05/2013  FINDINGS: Right subclavian porta catheter, tip at the mid SVC.  Mild cardiomegaly which is chronic. Normal upper mediastinal contours. No lung  contusion, hemothorax, or pneumothorax. No displaced fracture.  IMPRESSION: No active disease.   Electronically Signed   By: Jorje Guild M.D.   On: 09/04/2013 23:02   Dg Hip Complete Left  09/04/2013   CLINICAL DATA:  Fall with left hip pain.  Evaluate for fracture.  EXAM: LEFT HIP - COMPLETE 2+ VIEW  COMPARISON:  None.  FINDINGS: Mild irregularity of the left inferior pubic ramus appears similar to scanogram from 06/05/2013 abdominal CT. No evidence of acute pelvic ring fracture or proximal femur fracture. No dislocation.  IMPRESSION: No acute osseous abnormality.   Electronically Signed   By: Jorje Guild M.D.   On: 09/04/2013 23:00     EKG Interpretation   Date/Time:  Friday Sep 04 2013 21:53:02 EDT Ventricular Rate:  78 PR Interval:  206 QRS Duration: 147 QT Interval:  407 QTC Calculation: 464 R Axis:   -55 Text Interpretation:  Sinus tachycardia Ventricular trigeminy RBBB and  LAFB Since last tracing ectopy now present Confirmed by Patrice Moates  MD, [place]  [phone]) on 09/04/2013 10:53:34 PM      MDM   Final diagnoses:  Contusion of left hip  Contusion of buttock  Anemia    The patient will need imaging to evaluate for hip fracture, he does not appear in any distress otherwise and does not seem to have any other associated injuries. Chest x-ray and labs ordered as well, fentanyl for pain.   The patient was found to be anemic on laboratory workup. I discussed this with the patient, the wife states that the patient does have a fissure. I discussed with the family member and the patient the need for further workup including rectal exam and possible admission to the hospital. He refuses and states that he was to followup with his family doctor. He had minimal pain control with Fentanyl, Percocet will be given and prescribed, the patient will be discharged home to followup with his family doctor. He is aware that he is leaving against my official  medical recommendation. At this time is  not hypotensive, he is not tachycardic, he does not feel lightheaded or short of breath.  Meds given in ED:  Medications  oxyCODONE-acetaminophen (PERCOCET/ROXICET) 5-325 MG per tablet 2 tablet (not administered)  fentaNYL (SUBLIMAZE) injection 50 mcg (50 mcg Intravenous Given 09/04/13 2149)    New Prescriptions   OXYCODONE-ACETAMINOPHEN (PERCOCET) 5-325 MG PER TABLET    Take 1 tablet by mouth every 4 (four) hours as needed.      Johnna Acosta, MD 09/04/13 [phone]

## 2013-09-04 NOTE — ED Notes (Signed)
Dr Miller at bedside. 

## 2013-09-04 NOTE — Discharge Instructions (Signed)
Take a laxative if you are going to use the strong pain medication. Your x-ray show no signs of fracture of your pelvis or your hip. Your hemoglobin was low suggesting that you need further workup for your anemia but you have refused my recommendation to have that done this evening. You must talk your doctor on Monday for further testing and evaluation. This anemia may be the cause of generalized weakness or your fall this evening. Return to the hospital if you feel that you want this workup done for for further worsening symptoms.  Please call your doctor for a followup appointment within 24-48 hours. When you talk to your doctor please let them know that you were seen in the emergency department and have them acquire all of your records so that they can discuss the findings with you and formulate a treatment plan to fully care for your new and ongoing problems.

## 2013-09-04 NOTE — ED Notes (Signed)
Pt taken to xray 

## 2013-09-05 ENCOUNTER — Encounter (HOSPITAL_COMMUNITY): Payer: Medicare Other | Admitting: Certified Registered"

## 2013-09-05 ENCOUNTER — Emergency Department (HOSPITAL_COMMUNITY): Payer: Medicare Other | Admitting: Certified Registered"

## 2013-09-05 ENCOUNTER — Emergency Department (HOSPITAL_COMMUNITY): Payer: Medicare Other

## 2013-09-05 ENCOUNTER — Inpatient Hospital Stay (HOSPITAL_COMMUNITY)
Admission: EM | Admit: 2013-09-05 | Discharge: 2013-09-14 | DRG: 253 | Disposition: A | Discharge status: Rehabilitation Facility | Payer: Medicare Other | Attending: Vascular Surgery | Admitting: Vascular Surgery

## 2013-09-05 ENCOUNTER — Encounter (HOSPITAL_COMMUNITY): Payer: Self-pay | Admitting: Emergency Medicine

## 2013-09-05 ENCOUNTER — Inpatient Hospital Stay (HOSPITAL_COMMUNITY): Payer: Medicare Other

## 2013-09-05 ENCOUNTER — Encounter (HOSPITAL_COMMUNITY)
Admission: EM | Disposition: A | Discharge status: Rehabilitation Facility | Payer: Self-pay | Source: Home / Self Care | Attending: Vascular Surgery

## 2013-09-05 DIAGNOSIS — C20 Malignant neoplasm of rectum: Secondary | ICD-10-CM | POA: Diagnosis present

## 2013-09-05 DIAGNOSIS — Z794 Long term (current) use of insulin: Secondary | ICD-10-CM | POA: Diagnosis not present

## 2013-09-05 DIAGNOSIS — C787 Secondary malignant neoplasm of liver and intrahepatic bile duct: Secondary | ICD-10-CM | POA: Diagnosis not present

## 2013-09-05 DIAGNOSIS — E785 Hyperlipidemia, unspecified: Secondary | ICD-10-CM | POA: Diagnosis present

## 2013-09-05 DIAGNOSIS — M79A29 Nontraumatic compartment syndrome of unspecified lower extremity: Secondary | ICD-10-CM

## 2013-09-05 DIAGNOSIS — I2510 Atherosclerotic heart disease of native coronary artery without angina pectoris: Secondary | ICD-10-CM | POA: Diagnosis present

## 2013-09-05 DIAGNOSIS — I739 Peripheral vascular disease, unspecified: Secondary | ICD-10-CM | POA: Diagnosis present

## 2013-09-05 DIAGNOSIS — I69959 Hemiplegia and hemiparesis following unspecified cerebrovascular disease affecting unspecified side: Secondary | ICD-10-CM | POA: Diagnosis not present

## 2013-09-05 DIAGNOSIS — Z923 Personal history of irradiation: Secondary | ICD-10-CM | POA: Diagnosis not present

## 2013-09-05 DIAGNOSIS — K219 Gastro-esophageal reflux disease without esophagitis: Secondary | ICD-10-CM | POA: Diagnosis present

## 2013-09-05 DIAGNOSIS — I743 Embolism and thrombosis of arteries of the lower extremities: Secondary | ICD-10-CM

## 2013-09-05 DIAGNOSIS — R5381 Other malaise: Secondary | ICD-10-CM | POA: Diagnosis present

## 2013-09-05 DIAGNOSIS — D649 Anemia, unspecified: Secondary | ICD-10-CM | POA: Diagnosis present

## 2013-09-05 DIAGNOSIS — Z9221 Personal history of antineoplastic chemotherapy: Secondary | ICD-10-CM | POA: Diagnosis not present

## 2013-09-05 DIAGNOSIS — K5900 Constipation, unspecified: Secondary | ICD-10-CM | POA: Diagnosis not present

## 2013-09-05 DIAGNOSIS — E1142 Type 2 diabetes mellitus with diabetic polyneuropathy: Secondary | ICD-10-CM | POA: Diagnosis present

## 2013-09-05 DIAGNOSIS — T82898A Other specified complication of vascular prosthetic devices, implants and grafts, initial encounter: Secondary | ICD-10-CM

## 2013-09-05 DIAGNOSIS — M79609 Pain in unspecified limb: Secondary | ICD-10-CM | POA: Diagnosis present

## 2013-09-05 DIAGNOSIS — E1149 Type 2 diabetes mellitus with other diabetic neurological complication: Secondary | ICD-10-CM | POA: Diagnosis present

## 2013-09-05 DIAGNOSIS — Z79899 Other long term (current) drug therapy: Secondary | ICD-10-CM | POA: Diagnosis not present

## 2013-09-05 DIAGNOSIS — Z87891 Personal history of nicotine dependence: Secondary | ICD-10-CM

## 2013-09-05 DIAGNOSIS — I998 Other disorder of circulatory system: Secondary | ICD-10-CM | POA: Diagnosis present

## 2013-09-05 DIAGNOSIS — I10 Essential (primary) hypertension: Secondary | ICD-10-CM | POA: Diagnosis present

## 2013-09-05 DIAGNOSIS — Y832 Surgical operation with anastomosis, bypass or graft as the cause of abnormal reaction of the patient, or of later complication, without mention of misadventure at the time of the procedure: Secondary | ICD-10-CM | POA: Diagnosis present

## 2013-09-05 DIAGNOSIS — T8140XA Infection following a procedure, unspecified, initial encounter: Secondary | ICD-10-CM | POA: Diagnosis present

## 2013-09-05 HISTORY — PX: INTRAOPERATIVE ARTERIOGRAM: SHX5157

## 2013-09-05 HISTORY — PX: FEMORAL-POPLITEAL BYPASS GRAFT: SHX937

## 2013-09-05 HISTORY — PX: PATCH ANGIOPLASTY: SHX6230

## 2013-09-05 HISTORY — PX: FASCIOTOMY: SHX132

## 2013-09-05 LAB — BASIC METABOLIC PANEL
BUN: 21 mg/dL (ref 6–23)
CO2: 19 mEq/L (ref 19–32)
CREATININE: 1.16 mg/dL (ref 0.50–1.35)
Calcium: 9.5 mg/dL (ref 8.4–10.5)
Chloride: 98 mEq/L (ref 96–112)
GFR, EST AFRICAN AMERICAN: 72 mL/min — AB (ref >90)
GFR, EST NON AFRICAN AMERICAN: 62 mL/min — AB (ref >90)
GLUCOSE: 153 mg/dL — AB (ref 70–99)
Potassium: 4.5 mEq/L (ref 3.7–5.3)
Sodium: 135 mEq/L — ABNORMAL LOW (ref 137–147)

## 2013-09-05 LAB — CBC
HEMATOCRIT: 27.8 % — AB (ref 39.0–52.0)
Hemoglobin: 7.9 g/dL — ABNORMAL LOW (ref 13.0–17.0)
MCH: 20.3 pg — ABNORMAL LOW (ref 26.0–34.0)
MCHC: 28.4 g/dL — AB (ref 30.0–36.0)
MCV: 71.5 fL — AB (ref 78.0–100.0)
Platelets: 258 10*3/uL (ref 150–400)
RBC: 3.89 MIL/uL — ABNORMAL LOW (ref 4.22–5.81)
RDW: 17.5 % — ABNORMAL HIGH (ref 11.5–15.5)
WBC: 10.4 10*3/uL (ref 4.0–10.5)

## 2013-09-05 LAB — PROTIME-INR
INR: 1.14 (ref 0.00–1.49)
Prothrombin Time: 14.4 seconds (ref 11.6–15.2)

## 2013-09-05 LAB — TYPE AND SCREEN
ABO/RH(D): O POS
ANTIBODY SCREEN: NEGATIVE

## 2013-09-05 LAB — PREPARE RBC (CROSSMATCH)

## 2013-09-05 LAB — GLUCOSE, CAPILLARY: Glucose-Capillary: 140 mg/dL — ABNORMAL HIGH (ref 70–99)

## 2013-09-05 SURGERY — BYPASS GRAFT FEMORAL-POPLITEAL ARTERY
Anesthesia: General | Site: Leg Lower | Laterality: Left

## 2013-09-05 MED ORDER — ONDANSETRON HCL 4 MG/2ML IJ SOLN: 4.0000 mg | Freq: Four times a day (QID) | INTRAMUSCULAR | Status: DC | PRN

## 2013-09-05 MED ORDER — DEXTROSE-NACL 5-0.45 % IV SOLN
INTRAVENOUS | Status: DC
  Administered 2013-09-06: 01:00:00 via INTRAVENOUS

## 2013-09-05 MED ORDER — 0.9 % SODIUM CHLORIDE (POUR BTL) OPTIME
TOPICAL | Status: DC | PRN
  Administered 2013-09-05: 2000 mL

## 2013-09-05 MED ORDER — HEPARIN (PORCINE) IN NACL 100-0.45 UNIT/ML-% IJ SOLN
1150.0000 [IU]/h | INTRAMUSCULAR | Status: DC
  Filled 2013-09-05: qty 250

## 2013-09-05 MED ORDER — SODIUM CHLORIDE 0.9 % IV SOLN
INTRAVENOUS | Status: DC | PRN
  Administered 2013-09-05 (×3): via INTRAVENOUS

## 2013-09-05 MED ORDER — PROTAMINE SULFATE 10 MG/ML IV SOLN
INTRAVENOUS | Status: DC | PRN
  Administered 2013-09-05: 30 mg via INTRAVENOUS

## 2013-09-05 MED ORDER — SUFENTANIL CITRATE 50 MCG/ML IV SOLN
INTRAVENOUS | Status: AC
  Filled 2013-09-05: qty 1

## 2013-09-05 MED ORDER — PROTAMINE SULFATE 10 MG/ML IV SOLN
INTRAVENOUS | Status: AC
  Filled 2013-09-05: qty 5

## 2013-09-05 MED ORDER — SUCCINYLCHOLINE CHLORIDE 20 MG/ML IJ SOLN
INTRAMUSCULAR | Status: DC | PRN
  Administered 2013-09-05: 100 mg via INTRAVENOUS

## 2013-09-05 MED ORDER — OXYCODONE HCL 5 MG PO TABS: 5.0000 mg | ORAL_TABLET | Freq: Once | ORAL | Status: AC | PRN

## 2013-09-05 MED ORDER — SODIUM CHLORIDE 0.9 % IV SOLN
INTRAVENOUS | Status: DC | PRN
  Administered 2013-09-05: 21:00:00 via INTRAVENOUS

## 2013-09-05 MED ORDER — SODIUM CHLORIDE 0.9 % IR SOLN
Status: DC | PRN
  Administered 2013-09-05: 21:00:00

## 2013-09-05 MED ORDER — PHENYLEPHRINE 40 MCG/ML (10ML) SYRINGE FOR IV PUSH (FOR BLOOD PRESSURE SUPPORT)
PREFILLED_SYRINGE | INTRAVENOUS | Status: AC
  Filled 2013-09-05: qty 10

## 2013-09-05 MED ORDER — DEXTROSE 5 % IV SOLN
1.5000 g | Freq: Two times a day (BID) | INTRAVENOUS | Status: DC
  Administered 2013-09-06: 1.5 g via INTRAVENOUS
  Filled 2013-09-05 (×2): qty 1.5

## 2013-09-05 MED ORDER — ONDANSETRON HCL 4 MG/2ML IJ SOLN
INTRAMUSCULAR | Status: AC
  Filled 2013-09-05: qty 2

## 2013-09-05 MED ORDER — HEPARIN SODIUM (PORCINE) 5000 UNIT/ML IJ SOLN: 4000.0000 [IU] | Freq: Once | INTRAMUSCULAR | Status: DC

## 2013-09-05 MED ORDER — OXYCODONE HCL 5 MG/5ML PO SOLN: 5.0000 mg | Freq: Once | ORAL | Status: AC | PRN

## 2013-09-05 MED ORDER — CEFAZOLIN SODIUM-DEXTROSE 2-3 GM-% IV SOLR
INTRAVENOUS | Status: DC | PRN
  Administered 2013-09-05: 2 g via INTRAVENOUS

## 2013-09-05 MED ORDER — PROPOFOL 10 MG/ML IV BOLUS
INTRAVENOUS | Status: DC | PRN
  Administered 2013-09-05: 160 mg via INTRAVENOUS

## 2013-09-05 MED ORDER — INSULIN ASPART 100 UNIT/ML ~~LOC~~ SOLN
0.0000 [IU] | Freq: Every day | SUBCUTANEOUS | Status: DC
  Filled 2013-09-05 (×9): qty 0.05

## 2013-09-05 MED ORDER — HYDROMORPHONE HCL PF 1 MG/ML IJ SOLN: 0.2500 mg | INTRAMUSCULAR | Status: DC | PRN

## 2013-09-05 MED ORDER — IOHEXOL 300 MG/ML  SOLN
INTRAMUSCULAR | Status: DC | PRN
  Administered 2013-09-05: 23 mL via INTRAVENOUS

## 2013-09-05 MED ORDER — MIDAZOLAM HCL 2 MG/2ML IJ SOLN
INTRAMUSCULAR | Status: AC
  Filled 2013-09-05: qty 2

## 2013-09-05 MED ORDER — PHENYLEPHRINE HCL 10 MG/ML IJ SOLN
INTRAMUSCULAR | Status: DC | PRN
Start: 2013-09-05 — End: 2013-09-05
  Administered 2013-09-05 (×4): 80 ug via INTRAVENOUS

## 2013-09-05 MED ORDER — PROPOFOL 10 MG/ML IV BOLUS
INTRAVENOUS | Status: AC
  Filled 2013-09-05: qty 20

## 2013-09-05 MED ORDER — SUCCINYLCHOLINE CHLORIDE 20 MG/ML IJ SOLN
INTRAMUSCULAR | Status: AC
  Filled 2013-09-05: qty 1

## 2013-09-05 MED ORDER — HEPARIN SODIUM (PORCINE) 1000 UNIT/ML IJ SOLN
INTRAMUSCULAR | Status: DC | PRN
  Administered 2013-09-05: 2000 [IU] via INTRAVENOUS
  Administered 2013-09-05: 7000 [IU] via INTRAVENOUS

## 2013-09-05 MED ORDER — INSULIN ASPART 100 UNIT/ML ~~LOC~~ SOLN
0.0000 [IU] | Freq: Three times a day (TID) | SUBCUTANEOUS | Status: DC
  Administered 2013-09-06: 2 [IU] via SUBCUTANEOUS
  Administered 2013-09-06: 3 [IU] via SUBCUTANEOUS
  Administered 2013-09-07 (×3): 2 [IU] via SUBCUTANEOUS
  Administered 2013-09-08: 3 [IU] via SUBCUTANEOUS
  Administered 2013-09-09 – 2013-09-13 (×5): 2 [IU] via SUBCUTANEOUS
  Administered 2013-09-13: 3 [IU] via SUBCUTANEOUS
  Administered 2013-09-14: 2 [IU] via SUBCUTANEOUS
  Filled 2013-09-05 (×27): qty 0.15

## 2013-09-05 MED ORDER — SODIUM CHLORIDE 0.9 % IJ SOLN
INTRAMUSCULAR | Status: AC
  Filled 2013-09-05: qty 10

## 2013-09-05 MED ORDER — LIDOCAINE HCL (CARDIAC) 20 MG/ML IV SOLN
INTRAVENOUS | Status: DC | PRN
  Administered 2013-09-05: 100 mg via INTRAVENOUS

## 2013-09-05 MED ORDER — ONDANSETRON HCL 4 MG/2ML IJ SOLN
INTRAMUSCULAR | Status: DC | PRN
  Administered 2013-09-05: 4 mg via INTRAVENOUS

## 2013-09-05 MED ORDER — SUFENTANIL CITRATE 50 MCG/ML IV SOLN
INTRAVENOUS | Status: DC | PRN
  Administered 2013-09-05 (×2): 10 ug via INTRAVENOUS

## 2013-09-05 MED ORDER — LIDOCAINE HCL (CARDIAC) 20 MG/ML IV SOLN
INTRAVENOUS | Status: AC
  Filled 2013-09-05: qty 5

## 2013-09-05 MED ORDER — EPHEDRINE SULFATE 50 MG/ML IJ SOLN
INTRAMUSCULAR | Status: DC | PRN
  Administered 2013-09-05: 10 mg via INTRAVENOUS

## 2013-09-05 SURGICAL SUPPLY — 72 items
BANDAGE ELASTIC 4 VELCRO ST LF (GAUZE/BANDAGES/DRESSINGS) IMPLANT
BANDAGE ESMARK 6X9 LF (GAUZE/BANDAGES/DRESSINGS) IMPLANT
BLADE 10 SAFETY STRL DISP (BLADE) ×3 IMPLANT
BNDG CMPR 9X6 STRL LF SNTH (GAUZE/BANDAGES/DRESSINGS)
BNDG ESMARK 6X9 LF (GAUZE/BANDAGES/DRESSINGS)
BNDG GAUZE ELAST 4 BULKY (GAUZE/BANDAGES/DRESSINGS) ×2 IMPLANT
CANISTER SUCTION 2500CC (MISCELLANEOUS) ×3 IMPLANT
CANNULA VESSEL 3MM 2 BLNT TIP (CANNULA) ×3 IMPLANT
CATH EMB 2FR 60CM (CATHETERS) ×2 IMPLANT
CATH EMB 3FR 80CM (CATHETERS) ×8 IMPLANT
CATH EMB 4FR 80CM (CATHETERS) ×4 IMPLANT
CATH EMB 5FR 80CM (CATHETERS) ×2 IMPLANT
CLIP TI MEDIUM 24 (CLIP) ×3 IMPLANT
CLIP TI WIDE RED SMALL 24 (CLIP) ×3 IMPLANT
COVER SURGICAL LIGHT HANDLE (MISCELLANEOUS) ×3 IMPLANT
CUFF TOURNIQUET SINGLE 24IN (TOURNIQUET CUFF) IMPLANT
CUFF TOURNIQUET SINGLE 34IN LL (TOURNIQUET CUFF) IMPLANT
CUFF TOURNIQUET SINGLE 44IN (TOURNIQUET CUFF) IMPLANT
DRAIN CHANNEL 15F RND FF W/TCR (WOUND CARE) IMPLANT
DRAPE WARM FLUID 44X44 (DRAPE) ×3 IMPLANT
DRAPE X-RAY CASS 24X20 (DRAPES) ×2 IMPLANT
DRSG COVADERM 4X10 (GAUZE/BANDAGES/DRESSINGS) IMPLANT
DRSG COVADERM 4X14 (GAUZE/BANDAGES/DRESSINGS) ×2 IMPLANT
DRSG COVADERM 4X8 (GAUZE/BANDAGES/DRESSINGS) ×2 IMPLANT
DRSG MEPITEL 3X4 ME34 (GAUZE/BANDAGES/DRESSINGS) ×4 IMPLANT
DRSG VAC ATS LRG SENSATRAC (GAUZE/BANDAGES/DRESSINGS) ×4 IMPLANT
ELECT REM PT RETURN 9FT ADLT (ELECTROSURGICAL) ×3
ELECTRODE REM PT RTRN 9FT ADLT (ELECTROSURGICAL) ×1 IMPLANT
EVACUATOR SILICONE 100CC (DRAIN) IMPLANT
GLOVE BIO SURGEON STRL SZ7.5 (GLOVE) ×3 IMPLANT
GLOVE BIOGEL PI IND STRL 6.5 (GLOVE) IMPLANT
GLOVE BIOGEL PI IND STRL 7.5 (GLOVE) IMPLANT
GLOVE BIOGEL PI IND STRL 8 (GLOVE) ×1 IMPLANT
GLOVE BIOGEL PI INDICATOR 6.5 (GLOVE) ×4
GLOVE BIOGEL PI INDICATOR 7.5 (GLOVE) ×4
GLOVE BIOGEL PI INDICATOR 8 (GLOVE) ×2
GLOVE SS BIOGEL STRL SZ 7 (GLOVE) IMPLANT
GLOVE SUPERSENSE BIOGEL SZ 7 (GLOVE) ×2
GOWN STRL REUS W/ TWL LRG LVL3 (GOWN DISPOSABLE) ×3 IMPLANT
GOWN STRL REUS W/TWL LRG LVL3 (GOWN DISPOSABLE) ×12
KIT BASIN OR (CUSTOM PROCEDURE TRAY) ×3 IMPLANT
KIT ROOM TURNOVER OR (KITS) ×3 IMPLANT
MARKER GRAFT CORONARY BYPASS (MISCELLANEOUS) IMPLANT
NS IRRIG 1000ML POUR BTL (IV SOLUTION) ×6 IMPLANT
PACK PERIPHERAL VASCULAR (CUSTOM PROCEDURE TRAY) ×3 IMPLANT
PAD ARMBOARD 7.5X6 YLW CONV (MISCELLANEOUS) ×6 IMPLANT
PADDING CAST COTTON 6X4 STRL (CAST SUPPLIES) IMPLANT
PATCH VASCULAR VASCU GUARD 1X6 (Vascular Products) ×2 IMPLANT
SET COLLECT BLD 21X3/4 12 (NEEDLE) ×2 IMPLANT
SPONGE GAUZE 4X4 12PLY STER LF (GAUZE/BANDAGES/DRESSINGS) ×2 IMPLANT
SPONGE SURGIFOAM ABS GEL 100 (HEMOSTASIS) IMPLANT
STAPLER VISISTAT 35W (STAPLE) ×2 IMPLANT
STOPCOCK 4 WAY LG BORE MALE ST (IV SETS) IMPLANT
SUT ETHILON 3 0 PS 1 (SUTURE) IMPLANT
SUT PROLENE 5 0 C 1 24 (SUTURE) ×3 IMPLANT
SUT PROLENE 6 0 BV (SUTURE) ×7 IMPLANT
SUT PROLENE 7 0 BV 1 (SUTURE) IMPLANT
SUT SILK 2 0 FS (SUTURE) ×1 IMPLANT
SUT SILK 3 0 (SUTURE)
SUT SILK 3-0 18XBRD TIE 12 (SUTURE) IMPLANT
SUT VIC AB 2-0 CTB1 (SUTURE) ×4 IMPLANT
SUT VIC AB 3-0 SH 27 (SUTURE) ×3
SUT VIC AB 3-0 SH 27X BRD (SUTURE) ×2 IMPLANT
SUT VICRYL 4-0 PS2 18IN ABS (SUTURE) ×4 IMPLANT
SYR 3ML LL SCALE MARK (SYRINGE) ×4 IMPLANT
SYR TB 1ML LUER SLIP (SYRINGE) ×4 IMPLANT
TOWEL OR 17X24 6PK STRL BLUE (TOWEL DISPOSABLE) ×8 IMPLANT
TOWEL OR 17X26 10 PK STRL BLUE (TOWEL DISPOSABLE) ×6 IMPLANT
TRAY FOLEY CATH 16FRSI W/METER (SET/KITS/TRAYS/PACK) ×3 IMPLANT
TUBING EXTENTION W/L.L. (IV SETS) ×2 IMPLANT
UNDERPAD 30X30 INCONTINENT (UNDERPADS AND DIAPERS) ×3 IMPLANT
WATER STERILE IRR 1000ML POUR (IV SOLUTION) ×3 IMPLANT

## 2013-09-05 NOTE — ED Notes (Signed)
Blood bank called and stated they could use PT's old type and cross for surgery since it was taken last night

## 2013-09-05 NOTE — Progress Notes (Signed)
ANTICOAGULATION CONSULT NOTE - Initial Consult  Pharmacy Consult for heparin Indication: left leg ischemia  Allergies  Allergen Reactions  . Other     Became hypotensive. Pt states allergic to "muscle relaxer" but unsure of name.  . Tape Hives and Itching    medfix non woven tape    Patient Measurements: Height: 5\' 9"  (175.3 cm) Weight: 185 lb (83.915 kg) IBW/kg (Calculated) : 70.7 Heparin Dosing Weight: 83.9 kg  Vital Signs: Temp: 98.2 F (36.8 C) (05/09 1738) Temp src: Oral (05/09 1738) BP: 150/54 mmHg (05/09 1738) Pulse Rate: 72 (05/09 1738)  Labs:  Recent Labs  09/04/13 2123  HGB 7.5*  HCT 26.9*  PLT 300  LABPROT 13.1  INR 1.01  CREATININE 1.20    Estimated Creatinine Clearance: 58.1 ml/min (by C-G formula based on Cr of 1.2).   Medical History: Past Medical History  Diagnosis Date  . GERD (gastroesophageal reflux disease)   . Hyperlipidemia   . Hypertension   . Peripheral vascular disease   . Internal and external bleeding hemorrhoids   . History of gout     left great toe  . Depression   . Numbness in right leg     Hx: of diabetes  . Coronary artery disease     RCA occlusion with good collaterals, o/w no obs CAD 12/2011  . Pneumonia ~ 1977  . Type II diabetes mellitus   . History of blood transfusion 12/2011; 12/2012    "before OR; after OR" (01/22/2013)  . Stroke 2008    "left side weak; unable to move left hand still" (01/22/2013)  . Rectal cancer dx'd 08/2007    S/P chemo, radiation, biopsies  . Metastases to the liver dx'd 08/2010    Medications:  Scheduled:  . heparin  4,000 Units Intravenous Once   Infusions:    Assessment: 68 yo male with possible left leg ischemia will be started on heparin therapy.  A heparin bolus of 4000 units iv x 1 was already ordered. Hgb 7.5 and Plt 300 K; INR 1.01 on 09/04/13.  Patient was not on any anticoagulant prior to admission.  Goal of Therapy:  Heparin level 0.3-0.7 units/ml Monitor platelets by  anticoagulation protocol: Yes   Plan:  1) Start heparin at 1150 units/hr.  2) Check an 8 hr heparin level  3) Daily heparin level and CBC  Tsz-Yin Coni Homesley 09/05/2013,5:49 PM

## 2013-09-05 NOTE — ED Notes (Signed)
MD at bedside. Vascular surgery

## 2013-09-05 NOTE — Anesthesia Postprocedure Evaluation (Signed)
Anesthesia Post Note  Patient: ELYON ZOLL  Procedure(s) Performed: Procedure(s) (LRB): Thrombectomy of Left Femoral-Below Knee Popliteal Bypass Graft; Patch Angioplasty of Distal Portion of Bypass Graft (Left) Patch Angioplasty Distal Portion of Left Femoral-Below Knee Popliteal Bypass Graft (Left) INTRA OPERATIVE ARTERIOGRAM (Left) FASCIOTOMY (Left)  Anesthesia type: General  Patient location: PACU  Post pain: Pain level controlled and Adequate analgesia  Post assessment: Post-op Vital signs reviewed, Patient's Cardiovascular Status Stable, Respiratory Function Stable, Patent Airway and Pain level controlled  Last Vitals:  Filed Vitals:   09/05/13 2242  BP: 177/72  Pulse: 87  Temp: 36.2 C  Resp: 12    Post vital signs: Reviewed and stable  Level of consciousness: awake, alert  and oriented  Complications: No apparent anesthesia complications

## 2013-09-05 NOTE — ED Notes (Signed)
Anesthesia called blood bank and they decided they did want the type and cross drawn again on PT

## 2013-09-05 NOTE — ED Notes (Signed)
Called OR to let them know we didn't have portacath materials. They agreed for [place] to bring him to OR w/ no access and they would establish since his leg has no circulation

## 2013-09-05 NOTE — Op Note (Signed)
NAME: Donald [name]   MRN: [id number] DOB: [birth year]    DATE OF OPERATION: 09/05/2013  PREOP DIAGNOSIS: Ischemic left leg  POSTOP DIAGNOSIS: same  PROCEDURE:  1. Thrombectomy of left femoropopliteal bypass graft 2. Bovine pericardial patch of distal bypass graft 3. Intraoperative arteriogram 4. 4 compartment fasciotomy  SURGEON: Judeth Cornfield. Scot Dock, MD, FACS  ASSIST: Carlyn Reichert RNFA  ANESTHESIA: Gen.   EBL: 400 cc  INDICATIONS: Donald [name] is a 68 y.o. male with a complicated vascular history. He has undergone previous left axillobifemoral bypass graft. He had a left femoropopliteal bypass graft with vein which occluded and this was replaced with a prosthetic graft. 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 He developed an ischemic left lower extremity at 4 AM on 09/05/2013. Ultimately he elected to present to the emergency room where he was found to have an ischemic lower extremity endovascular surgery was consult. He was taken urgently to the acting room for attempted revascularization.  FINDINGS: The femoropopliteal bypass graft was thrombectomized from the distal anastomosis in order to avoid the groin incision. Completion arteriogram showed minimal vessels distally. I reperformed thrombectomy and no further clot was retrieved suggesting that this was spasm as I was able to pass the catheter all the way to the ankle without difficulty. The patient did have significant compartment swelling at the completion. The anterior compartment did not contract. It looked marginally well perfused.  TECHNIQUE: Patient was taken to the operating room and received a general anesthetic. A central line had been placed by anesthesia. The entire left lower extremity was prepped and draped in the usual sterile fashion. A longitudinal incision was made on the medial  aspect of the left calf and dissection carried down through scar tissue to the distal aspect of the prosthetic femoropopliteal bypass graft. This was dissected free down to where it was anastomosed to the below knee popliteal artery. A longitudinal graftotomy was made over the distal bypass graft. A 4 Fogarty catheter was passed proximally and a large amount of fresh clot was retrieved. A difficulty in passing the catheter through the proximal anastomosis. I used a 4 Fogarty, 3 Fogarty, and 5 Fogarty catheter. Ultimately I was able to establish inflow and establish patency of the graft. Once this was completed the graft was flushed with heparinized saline and clamped. I then passed a 3 Fogarty catheter distally and was able to get to the ankle without difficulty. Only a small amount of clot was retrieved distally. The graftotomy was then closed with a bovine pericardial patch which was sewn with 6-0 Prolene suture. At the completion a completion arteriogram was obtained which showed poor visualization of the tibials. Although I felt this was likely spasm I reopened the bovine pericardial patch and passed the catheter distally multiple times with no further clot retrieved. The catheter again passed to the ankle without difficulty. The longitudinal graftotomy in the bovine pericardial patch was closed with a running 6-0 Prolene suture. The patient appeared to have some calf swelling and therefore through the medial incision this was extended in the superficial and deep posterior compartments were decompressed area at the compartment was decompressed by dividing the soleus muscle to enter the deep space. Laterally a longitudinal incision was made in the lateral compartment and anterior compartment were opened and appeared to be under some pressure. The anterior compartment did not contract  well. The lateral compartment also did not contract normally. At completion there was a posterior tibial signal with the Doppler.  There was not too much tension on the skin laterally. After hemostasis was obtained this was closed with staples. The medial wound could not be closed without too much tension and therefore a VAC was placed on this. The patient was transferred to the recovery room in stable condition. All needle and sponge counts were correct.  Deitra Mayo, MD, FACS [facility]  DATE OF DICTATION:   09/05/2013

## 2013-09-05 NOTE — H&P (Signed)
[facility] [place]  Patient name: Donald [name] MRN: [id number] DOB: 1946/04/30 Sex: male  REASON FOR ADMISSION: Ischemic left leg  HPI: Donald [name] is a 68 y.o. male who developed the sudden onset of pain in his left leg at 4 AM today. He did not come to the ED until this afternoon. Of note, the patient fell yesterday and presented to the emergency department with left hip pain. Hip x-rays did not show any fracture. He was however noted to be significantly anemic with a hemoglobin of 7.5, and at the emergency department recommended transfusion. However the patient refused and wanted to leave.  The patient has a complicated vascular history. According to the family he has had work on his left axillobifemoral bypass graft several times. 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 Oneida Alar had recommended removal of his graft. However removal of the graft would possibly result in bilateral lower extremity amputation and the patient did not wish to have graft removal. 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  Os recently, on 01/06/2013 the patient had a left femoral to below knee popliteal artery bypass with a vein graft and patch angioplasty of the left common femoral artery by Dr. Oneida Alar.  He was taken back to the afternoon on 01/10/2013 by Dr. Kellie Simmering with an occluded bypass graft. He underwent attempted thrombectomy of the graft which was unsuccessful and therefore a new left common femoral to below knee popliteal artery bypass graft with PTFE was performed.  The patient has had a previous right brain stroke with left-sided weakness. He has been ambulatory with a cane.  Past Medical History  Diagnosis Date  . GERD (gastroesophageal reflux disease)   . Hyperlipidemia   . Hypertension   . Peripheral vascular disease   . Internal and external bleeding hemorrhoids   . History of gout     left great toe  . Depression     . Numbness in right leg     Hx: of diabetes  . Coronary artery disease     RCA occlusion with good collaterals, o/w no obs CAD 12/2011  . Pneumonia ~ 1977  . Type II diabetes mellitus   . History of blood transfusion 12/2011; 12/2012    "before OR; after OR" (01/22/2013)  . Stroke 2008    "left side weak; unable to move left hand still" (01/22/2013)  . Rectal cancer dx'd 08/2007    S/P chemo, radiation, biopsies  . Metastases to the liver dx'd 08/2010   Family History  Problem Relation Age of Onset  . Cancer - Other Sister    SOCIAL HISTORY: History  Substance Use Topics  . Smoking status: Former Smoker -- 0.10 packs/day for 57 years    Types: Cigarettes  . Smokeless tobacco: Never Used     Comment: pt states that he has only smoked 1/2 cig since 02/06/2013  . Alcohol Use: No   According to his wife, he does continue to smoke.  Allergies  Allergen Reactions  . Other     Became hypotensive. Pt states allergic to "muscle relaxer" but unsure of name.  . Tape Hives and Itching    medfix non woven tape    Current Facility-Administered Medications  Medication Dose Route Frequency Provider Last Rate Last Dose  . heparin ADULT infusion 100 units/mL (25000 units/250 mL)  1,150 Units/hr Intravenous Continuous Osvaldo Shipper, MD      .  heparin injection 4,000 Units  4,000 Units Intravenous Once Osvaldo Shipper, MD       Current Outpatient Prescriptions  Medication Sig Dispense Refill  . Ascorbic Acid (VITAMIN C) 1000 MG tablet Take 500 mg by mouth daily.       Marland Kitchen dipyridamole-aspirin (AGGRENOX) 200-25 MG per 12 hr capsule Take 1 capsule by mouth 2 (two) times daily.      Marland Kitchen docusate sodium (COLACE) 100 MG capsule Take 200 mg by mouth 2 (two) times daily as needed for mild constipation.      . gabapentin (NEURONTIN) 300 MG capsule Take 1 capsule (300 mg total) by mouth 3 (three) times daily.  90 capsule  6  . insulin detemir (LEVEMIR) 100 UNIT/ML injection Inject 10 Units into  the skin daily.      Marland Kitchen lisinopril (PRINIVIL,ZESTRIL) 5 MG tablet Take 2.5 mg by mouth daily.      . mesalamine (CANASA) 1000 MG suppository Place 1 suppository (1,000 mg total) rectally at bedtime.  15 suppository  2  . metFORMIN (GLUCOPHAGE) 500 MG tablet Take 500 mg by mouth 2 (two) times daily with a meal.      . omega-3 acid ethyl esters (LOVAZA) 1 G capsule Take 2 g by mouth 2 (two) times daily.       Marland Kitchen omeprazole (PRILOSEC) 40 MG capsule Take 40 mg by mouth daily.      Marland Kitchen oxyCODONE-acetaminophen (PERCOCET) 5-325 MG per tablet Take 1 tablet by mouth every 4 (four) hours as needed.  20 tablet  0  . Probiotic Product (ALIGN PO) Take 1 tablet by mouth at bedtime.       REVIEW OF SYSTEMS: Valu.Nieves ] denotes positive finding; [  ] denotes negative finding CARDIOVASCULAR:  [ ]  chest pain   [ ]  chest pressure   [ ]  palpitations   [ ]  orthopnea   Valu.Nieves ] dyspnea on exertion   [ ]  claudication   Valu.Nieves ] rest pain   [ ]  DVT   [ ]  phlebitis PULMONARY:   [ ]  productive cough   [ ]  asthma   [ ]  wheezing NEUROLOGIC:   Valu.Nieves ] weakness  Valu.Nieves ] paresthesias  [ ]  aphasia  [ ]  amaurosis  [ ]  dizziness HEMATOLOGIC:   [ ]  bleeding problems   [ ]  clotting disorders MUSCULOSKELETAL:  Valu.Nieves ] joint pain   [ ]  joint swelling [ ]  leg swelling GASTROINTESTINAL: [ ]   blood in stool  [ ]   hematemesis GENITOURINARY:  [ ]   dysuria  [ ]   hematuria PSYCHIATRIC:  Valu.Nieves ] history of major depression INTEGUMENTARY:  [ ]  rashes  [ ]  ulcers CONSTITUTIONAL:  [ ]  fever   [ ]  chills  PHYSICAL EXAM: Filed Vitals:   09/05/13 1738  BP: 150/54  Pulse: 72  Temp: 98.2 F (36.8 C)  TempSrc: Oral  Resp: 18  Height: 5\' 9"  (1.753 m)  Weight: 185 lb (83.915 kg)  SpO2: 99%   Body mass index is 27.31 kg/(m^2). GENERAL: The patient is a well-nourished male, in no acute distress. The vital signs are documented above. CARDIOVASCULAR: There is a regular rate and rhythm. He has a monophasic right dorsalis pedis and posterior tibial signal with the  Doppler. The right foot appeared adequately perfused. The left foot is cold and mottled with no Doppler flow. PULMONARY: There is good air exchange bilaterally without wheezing or rales. ABDOMEN: Soft and non-tender with normal pitched bowel sounds.  MUSCULOSKELETAL: There are no major  deformities or cyanosis. NEUROLOGIC: he is unable to move the left foot. He has markedly decreased sensation in the left foot. 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 PSYCHIATRIC: The patient has a normal affect.  DATA:  Lab Results  Component Value Date   WBC 9.1 09/04/2013   HGB 7.5* 09/04/2013   HCT 26.9* 09/04/2013   MCV 71.4* 09/04/2013   PLT 300 09/04/2013   Lab Results  Component Value Date   NA 138 09/04/2013   K 4.2 09/04/2013   CL 102 09/04/2013   CO2 19 09/04/2013   Lab Results  Component Value Date   CREATININE 1.20 09/04/2013   Lab Results  Component Value Date   INR 1.01 09/04/2013   INR 0.91 04/09/2013   INR 1.06 01/02/2013   Lab Results  Component Value Date   HGBA1C 8.4* 04/12/2013   MEDICAL ISSUES:  ISCHEMIC LEFT LOWER EXTREMITY: this patient developed an ischemic left lower extremity 15 hours ago. He did not present to the emergency department until this afternoon. He has profound ischemia of the left lower extremity most likely secondary to occlusion of his left femoropopliteal bypass graft. This bypass graft was done in September of 2014. His initial bypass with vein failed in the immediate postoperative period and was replaced with a prosthetic graft. 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 He's had multiple operations in the left groin and has been told that the only option to address this is removal of his axillobifemoral bypass graft which would likely result in bilateral lower extremity amputations. 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 After extensive discussion  with the patient and his family, they are agreeable to proceed with attempted thrombectomy of his femoropopliteal graft from the below the knee incision. They do not wish [place] to operate on the left groin given the chronic infection and the risk of requiring removal of his axillobifemoral bypass graft which would likely result in bilateral lower extremity amputations. For this reason we will attempt thrombectomy of his graft from below and if this is not successful we would stop at that point and he would  Subsequently require a above-the-knee amputation. The situation is further complicated by his anemia. He will need to be transfused, but we will need to proceed with surgery urgently given his profound ischemia and the length of ischemia. We have discussed the potential complications of surgery including but not limited to bleeding, wound healing problems infection and possible limb loss. They are agreeable with exploration of the left groin if thrombectomy of the graft results in bleeding from the left groin which is a possibility given the chronic infection that he has had.  Angelia Mould [facility] [place] Beeper: [phone]

## 2013-09-05 NOTE — Anesthesia Procedure Notes (Signed)
Procedure Name: LMA Insertion and Intubation Date/Time: 09/05/2013 8:02 PM Performed by: Claris Che Pre-anesthesia Checklist: Patient identified, Emergency Drugs available, Suction available and Patient being monitored Patient Re-evaluated:Patient Re-evaluated prior to inductionOxygen Delivery Method: Circle system utilized Preoxygenation: Pre-oxygenation with 100% oxygen Intubation Type: IV induction, Cricoid Pressure applied and Rapid sequence Ventilation: Mask ventilation without difficulty Laryngoscope Size: Mac and 3 Grade View: Grade II Tube type: Oral Tube size: 8.0 mm Number of attempts: 1 Airway Equipment and Method: Stylet and LTA kit utilized Placement Confirmation: ETT inserted through vocal cords under direct vision,  positive ETCO2 and breath sounds checked- equal and bilateral Secured at: 23 cm Tube secured with: Tape Dental Injury: Teeth and Oropharynx as per pre-operative assessment

## 2013-09-05 NOTE — ED Notes (Signed)
Asked anesthesia if they wanted heparin tubed to them for PT. Anesthesia said to cancel order an they would give new orders PRN upstairs since PT didn't have access yet

## 2013-09-05 NOTE — ED Notes (Signed)
Fell Bruise on left hip , L foot -seen here yesterday. +2 pitting edema today and feeling cooler today. Leg has continued to swell and EMS couldn't get palpable pulse. BP160/80, p 72, rr 20 sat 96%

## 2013-09-05 NOTE — Anesthesia Preprocedure Evaluation (Signed)
Anesthesia Evaluation  Patient identified by MRN, date of birth, ID band Patient awake    Reviewed: Allergy & Precautions, H&P , NPO status , Patient's Chart, lab work & pertinent test results  Airway Mallampati: II  Neck ROM: full    Dental   Pulmonary former smoker,          Cardiovascular hypertension, + CAD and + Peripheral Vascular Disease     Neuro/Psych Depression CVA, Residual Symptoms    GI/Hepatic GERD-  ,  Endo/Other  diabetes, Type 2  Renal/GU      Musculoskeletal   Abdominal   Peds  Hematology   Anesthesia Other Findings   Reproductive/Obstetrics                           Anesthesia Physical Anesthesia Plan  ASA: IV and emergent  Anesthesia Plan: General   Post-op Pain Management:    Induction: Intravenous  Airway Management Planned: Oral ETT  Additional Equipment:   Intra-op Plan:   Post-operative Plan: Extubation in OR  Informed Consent: I have reviewed the patients History and Physical, chart, labs and discussed the procedure including the risks, benefits and alternatives for the proposed anesthesia with the patient or authorized representative who has indicated his/her understanding and acceptance.     Plan Discussed with: CRNA, Anesthesiologist and Surgeon  Anesthesia Plan Comments:         Anesthesia Quick Evaluation

## 2013-09-05 NOTE — ED Notes (Signed)
PT placed in [facility]

## 2013-09-05 NOTE — ED Provider Notes (Signed)
CSN: [id number]     Arrival date & time 09/05/13  1734 History   First MD Initiated Contact with Patient 09/05/13 1735     Chief Complaint  Patient presents with  . Foot Swelling     (Consider location/radiation/quality/duration/timing/severity/associated sxs/prior Treatment) HPI Comments: [place] last night, evaluated in ED for L hip pain, xrays negative, discharged. L hip pain resolved, then began having L leg pain, worsening, constant with associated coolness, duskiness.   Patient is a 68 y.o. male presenting with leg pain. The history is provided by the patient.  Leg Pain Location:  Leg Injury: yes   Mechanism of injury: fall   Mechanism of injury comment:  Slipped and fell on L hip last night, xrays negative, sent home Leg location:  L leg Pain details:    Quality:  Aching and dull   Radiates to:  Does not radiate   Severity:  Moderate   Onset quality:  Gradual   Timing:  Constant   Progression:  Worsening Chronicity:  New Dislocation: no   Associated symptoms: no fever     Past Medical History  Diagnosis Date  . GERD (gastroesophageal reflux disease)   . Hyperlipidemia   . Hypertension   . Peripheral vascular disease   . Internal and external bleeding hemorrhoids   . History of gout     left great toe  . Depression   . Numbness in right leg     Hx: of diabetes  . Coronary artery disease     RCA occlusion with good collaterals, o/w no obs CAD 12/2011  . Pneumonia ~ 1977  . Type II diabetes mellitus   . History of blood transfusion 12/2011; 12/2012    "before OR; after OR" (01/22/2013)  . Stroke 2008    "left side weak; unable to move left hand still" (01/22/2013)  . Rectal cancer dx'd 08/2007    S/P chemo, radiation, biopsies  . Metastases to the liver dx'd 08/2010   Past Surgical History  Procedure Laterality Date  . Carotid endarterectomy Right 2008  . Liver cryoablation  08/2010    "chemo shrunk the cancer 50% then they went in and burned the rest of it"  .  Axillary-femoral bypass graft  01/29/2012    Procedure: BYPASS GRAFT AXILLA-BIFEMORAL;  Surgeon: Elam Dutch, MD;  Location: [facility] OR;  Service: Vascular;  Laterality: N/A;  Left axilla-bifemoral bypass using gore-tex graft.   . Colonoscopy w/ biopsies and polypectomy      Hx: of  . Femoral-popliteal bypass graft Left 01/06/2013    Procedure: Femoral-Popliteal Artery Bypass;  Surgeon: Elam Dutch, MD;  Location: [place];  Service: Vascular;  Laterality: Left;  . Intraoperative arteriogram Left 01/06/2013    Procedure: INTRA OPERATIVE ARTERIOGRAM;  Surgeon: Elam Dutch, MD;  Location: [place];  Service: Vascular;  Laterality: Left;  . Femoral-popliteal bypass graft Left 01/10/2013    Procedure: THROMBECTOMY POSSIBLE BYPASS GRAFT FEMORAL-POPLITEAL ARTERY;  Surgeon: Mal Misty, MD;  Location: [place];  Service: Vascular;  Laterality: Left;  Attempted Thrombectomy of left Femoral/Popliteal graft - Unsuccessful.  Insertion of Left femoral to below the knee popliteal propaten graft.  . Mass biopsy  2009    "found mass that was cancer; later had chemo and radiation; never cut mass out" (01/22/2013)   Family History  Problem Relation Age of Onset  . Cancer - Other Sister    History  Substance Use Topics  . Smoking status: Former Smoker -- 0.10 packs/day for  57 years    Types: Cigarettes  . Smokeless tobacco: Never Used     Comment: pt states that he has only smoked 1/2 cig since 02/06/2013  . Alcohol Use: No    Review of Systems  Constitutional: Negative for fever and chills.  Respiratory: Negative for cough and shortness of breath.   Gastrointestinal: Negative for vomiting and abdominal pain.  All other systems reviewed and are negative.     Allergies  Other and Tape  Home Medications   Prior to Admission medications   Medication Sig Start Date End Date Taking? Authorizing Provider  Ascorbic Acid (VITAMIN C) 1000 MG tablet Take 500 mg by mouth daily.    Yes Historical Provider,  MD  dipyridamole-aspirin (AGGRENOX) 200-25 MG per 12 hr capsule Take 1 capsule by mouth 2 (two) times daily.   Yes Historical Provider, MD  docusate sodium (COLACE) 100 MG capsule Take 200 mg by mouth 2 (two) times daily as needed for mild constipation.   Yes Historical Provider, MD  gabapentin (NEURONTIN) 300 MG capsule Take 1 capsule (300 mg total) by mouth 3 (three) times daily. 05/15/13  Yes Dennie Bible, NP  insulin detemir (LEVEMIR) 100 UNIT/ML injection Inject 10 Units into the skin daily.   Yes Historical Provider, MD  lisinopril (PRINIVIL,ZESTRIL) 5 MG tablet Take 2.5 mg by mouth daily.   Yes Historical Provider, MD  mesalamine (CANASA) 1000 MG suppository Place 1 suppository (1,000 mg total) rectally at bedtime. 08/04/13  Yes Amy S Esterwood, PA-C  metFORMIN (GLUCOPHAGE) 500 MG tablet Take 500 mg by mouth 2 (two) times daily with a meal.   Yes Historical Provider, MD  omega-3 acid ethyl esters (LOVAZA) 1 G capsule Take 2 g by mouth 2 (two) times daily.    Yes Historical Provider, MD  omeprazole (PRILOSEC) 40 MG capsule Take 40 mg by mouth daily.   Yes Historical Provider, MD  oxyCODONE-acetaminophen (PERCOCET) 5-325 MG per tablet Take 1 tablet by mouth every 4 (four) hours as needed. 09/04/13  Yes Johnna Acosta, MD  Probiotic Product (ALIGN PO) Take 1 tablet by mouth at bedtime.   Yes Historical Provider, MD   BP 150/54  Pulse 72  Temp(Src) 98.2 F (36.8 C) (Oral)  Resp 18  Ht 5\' 9"  (1.753 m)  Wt 185 lb (83.915 kg)  BMI 27.31 kg/m2  SpO2 99% Physical Exam  Nursing note and vitals reviewed. Constitutional: He is oriented to person, place, and time. He appears well-developed and well-nourished. No distress.  HENT:  Head: Normocephalic and atraumatic.  Mouth/Throat: No oropharyngeal exudate.  Eyes: EOM are normal. Pupils are equal, round, and reactive to light.  Neck: Normal range of motion. Neck supple.  Cardiovascular: Normal rate and regular rhythm.  Exam reveals no  friction rub.   No murmur heard. Pulmonary/Chest: Effort normal and breath sounds normal. No respiratory distress. He has no wheezes. He has no rales.  Abdominal: Soft. He exhibits no distension. There is no tenderness. There is no rebound.  Musculoskeletal: Normal range of motion.  L leg cool to the touch, mildly dusky, no pulse palpated or dopplered. No swelling. Sensation normal. Decreased cap refill.   Neurological: He is alert and oriented to person, place, and time. He exhibits normal muscle tone. Coordination normal.  Skin: No rash noted. He is not diaphoretic.    ED Course  Procedures (including critical care time) Labs Review Labs Reviewed  CBC  BASIC METABOLIC PANEL  PROTIME-INR    Imaging Review Dg  Chest 1 View  09/04/2013   CLINICAL DATA:  Fall.  EXAM: CHEST - 1 VIEW  COMPARISON:  06/05/2013  FINDINGS: Right subclavian porta catheter, tip at the mid SVC.  Mild cardiomegaly which is chronic. Normal upper mediastinal contours. No lung contusion, hemothorax, or pneumothorax. No displaced fracture.  IMPRESSION: No active disease.   Electronically Signed   By: Jorje Guild M.D.   On: 09/04/2013 23:02   Dg Hip Complete Left  09/04/2013   CLINICAL DATA:  Fall with left hip pain.  Evaluate for fracture.  EXAM: LEFT HIP - COMPLETE 2+ VIEW  COMPARISON:  None.  FINDINGS: Mild irregularity of the left inferior pubic ramus appears similar to scanogram from 06/05/2013 abdominal CT. No evidence of acute pelvic ring fracture or proximal femur fracture. No dislocation.  IMPRESSION: No acute osseous abnormality.   Electronically Signed   By: Jorje Guild M.D.   On: 09/04/2013 23:00     EKG Interpretation None      CRITICAL CARE Performed by: Osvaldo Shipper   Total critical care time: 30 minutes  Critical care time was exclusive of separately billable procedures and treating other patients.  Critical care was necessary to treat or prevent imminent or life-threatening  deterioration.  Critical care was time spent personally by me on the following activities: development of treatment plan with patient and/or surrogate as well as nursing, discussions with consultants, evaluation of patient's response to treatment, examination of patient, obtaining history from patient or surrogate, ordering and performing treatments and interventions, ordering and review of laboratory studies, ordering and review of radiographic studies, pulse oximetry and re-evaluation of patient's condition.  MDM   Final diagnoses:  Arterial embolism of left leg    41M with hx of PVD, CAD, HTN, HLD presents with L leg cold, pulseless. Seen last night for a fall, negative L hip xray at that time. Pain in hip resolved, pain moved to L lower leg last night, persistent. No numbness/tingling, no swelling. Normal sensation, movement intact. Of note, has baseline leg length discrepancy with left leg a few centimeters shorter than his right. No pain with ROM of L hip.  L leg cool to the touch from the L knee down, L femoral pulse intact, unable to palpate popliteal pulse on that side. Unable to doppler pulse in the L foot, pulses easily dopplerable in the R foot. Heparin initiated, vascular surgery consulted. Dr. Scot Dock at bedside shortly after consultant called. Patient taken swiftly to OR.  Osvaldo Shipper, MD 09/05/13 [phone]

## 2013-09-05 NOTE — Transfer of Care (Signed)
Immediate Anesthesia Transfer of Care Note  Patient: Donald [name]  Procedure(s) Performed: Procedure(s): Thrombectomy of Left Femoral-Below Knee Popliteal Bypass Graft; Patch Angioplasty of Distal Portion of Bypass Graft (Left) Patch Angioplasty Distal Portion of Left Femoral-Below Knee Popliteal Bypass Graft (Left) INTRA OPERATIVE ARTERIOGRAM (Left) FASCIOTOMY (Left)  Patient Location: PACU  Anesthesia Type:General  Level of Consciousness: awake, alert , oriented, patient cooperative and responds to stimulation  Airway & Oxygen Therapy: Patient Spontanous Breathing and Patient connected to nasal cannula oxygen  Post-op Assessment: Report given to PACU RN, Post -op Vital signs reviewed and stable and Patient moving all extremities X 4  Post vital signs: Reviewed and stable  Complications: No apparent anesthesia complications

## 2013-09-06 LAB — CREATININE, SERUM
CREATININE: 1.13 mg/dL (ref 0.50–1.35)
GFR calc non Af Amer: 64 mL/min — ABNORMAL LOW (ref >90)
GFR, EST AFRICAN AMERICAN: 75 mL/min — AB (ref >90)

## 2013-09-06 LAB — BASIC METABOLIC PANEL
BUN: 17 mg/dL (ref 6–23)
CO2: 21 meq/L (ref 19–32)
Calcium: 7.9 mg/dL — ABNORMAL LOW (ref 8.4–10.5)
Chloride: 105 mEq/L (ref 96–112)
Creatinine, Ser: 1.13 mg/dL (ref 0.50–1.35)
GFR calc Af Amer: 75 mL/min — ABNORMAL LOW (ref >90)
GFR, EST NON AFRICAN AMERICAN: 64 mL/min — AB (ref >90)
Glucose, Bld: 180 mg/dL — ABNORMAL HIGH (ref 70–99)
Potassium: 4.8 mEq/L (ref 3.7–5.3)
SODIUM: 139 meq/L (ref 137–147)

## 2013-09-06 LAB — GLUCOSE, CAPILLARY
GLUCOSE-CAPILLARY: 149 mg/dL — AB (ref 70–99)
GLUCOSE-CAPILLARY: 131 mg/dL — AB (ref 70–99)
Glucose-Capillary: 126 mg/dL — ABNORMAL HIGH (ref 70–99)
Glucose-Capillary: 126 mg/dL — ABNORMAL HIGH (ref 70–99)
Glucose-Capillary: 193 mg/dL — ABNORMAL HIGH (ref 70–99)

## 2013-09-06 LAB — CBC
HCT: 30 % — ABNORMAL LOW (ref 39.0–52.0)
Hemoglobin: 9.1 g/dL — ABNORMAL LOW (ref 13.0–17.0)
MCH: 23 pg — ABNORMAL LOW (ref 26.0–34.0)
MCHC: 30.3 g/dL (ref 30.0–36.0)
MCV: 75.8 fL — ABNORMAL LOW (ref 78.0–100.0)
PLATELETS: 223 10*3/uL (ref 150–400)
RBC: 3.96 MIL/uL — AB (ref 4.22–5.81)
RDW: 18.8 % — ABNORMAL HIGH (ref 11.5–15.5)
WBC: 8.6 10*3/uL (ref 4.0–10.5)

## 2013-09-06 LAB — MRSA PCR SCREENING: MRSA by PCR: NEGATIVE

## 2013-09-06 LAB — HEMOGLOBIN A1C
Hgb A1c MFr Bld: 7.7 % — ABNORMAL HIGH (ref <5.7)
MEAN PLASMA GLUCOSE: 174 mg/dL — AB (ref <117)

## 2013-09-06 MED ORDER — OMEGA-3-ACID ETHYL ESTERS 1 G PO CAPS
2.0000 g | ORAL_CAPSULE | Freq: Two times a day (BID) | ORAL | Status: DC
Start: 2013-09-06 — End: 2013-09-14
  Administered 2013-09-06 – 2013-09-14 (×16): 2 g via ORAL
  Filled 2013-09-06 (×19): qty 2

## 2013-09-06 MED ORDER — ACETAMINOPHEN 325 MG PO TABS: 325.0000 mg | ORAL_TABLET | ORAL | Status: DC | PRN

## 2013-09-06 MED ORDER — ENOXAPARIN SODIUM 40 MG/0.4ML ~~LOC~~ SOLN
40.0000 mg | SUBCUTANEOUS | Status: DC
  Administered 2013-09-06 – 2013-09-14 (×9): 40 mg via SUBCUTANEOUS
  Filled 2013-09-06 (×9): qty 0.4

## 2013-09-06 MED ORDER — MESALAMINE 1000 MG RE SUPP
1000.0000 mg | Freq: Every day | RECTAL | Status: DC
  Administered 2013-09-06 – 2013-09-13 (×9): 1000 mg via RECTAL
  Filled 2013-09-06 (×10): qty 1

## 2013-09-06 MED ORDER — LISINOPRIL 2.5 MG PO TABS
2.5000 mg | ORAL_TABLET | Freq: Every day | ORAL | Status: DC
  Administered 2013-09-06 – 2013-09-14 (×9): 2.5 mg via ORAL
  Filled 2013-09-06 (×9): qty 1

## 2013-09-06 MED ORDER — DOCUSATE SODIUM 100 MG PO CAPS
200.0000 mg | ORAL_CAPSULE | Freq: Two times a day (BID) | ORAL | Status: DC | PRN
  Administered 2013-09-13 (×2): 200 mg via ORAL
  Filled 2013-09-06 (×2): qty 2

## 2013-09-06 MED ORDER — GUAIFENESIN-DM 100-10 MG/5ML PO SYRP: 15.0000 mL | ORAL_SOLUTION | ORAL | Status: DC | PRN

## 2013-09-06 MED ORDER — LABETALOL HCL 5 MG/ML IV SOLN
10.0000 mg | INTRAVENOUS | Status: DC | PRN
  Filled 2013-09-06: qty 4

## 2013-09-06 MED ORDER — CEPHALEXIN 500 MG PO CAPS
500.0000 mg | ORAL_CAPSULE | Freq: Three times a day (TID) | ORAL | Status: DC
  Administered 2013-09-06 – 2013-09-11 (×16): 500 mg via ORAL
  Filled 2013-09-06 (×19): qty 1

## 2013-09-06 MED ORDER — ALUM & MAG HYDROXIDE-SIMETH 200-200-20 MG/5ML PO SUSP: 15.0000 mL | ORAL | Status: DC | PRN

## 2013-09-06 MED ORDER — HYDRALAZINE HCL 20 MG/ML IJ SOLN: 10.0000 mg | INTRAMUSCULAR | Status: DC | PRN

## 2013-09-06 MED ORDER — OXYCODONE-ACETAMINOPHEN 5-325 MG PO TABS
1.0000 | ORAL_TABLET | ORAL | Status: DC | PRN
  Administered 2013-09-06 – 2013-09-11 (×23): 2 via ORAL
  Administered 2013-09-11: 1 via ORAL
  Administered 2013-09-11 – 2013-09-14 (×17): 2 via ORAL
  Filled 2013-09-06 (×41): qty 2

## 2013-09-06 MED ORDER — DOPAMINE-DEXTROSE 3.2-5 MG/ML-% IV SOLN
3.0000 ug/kg/min | Freq: Every day | INTRAVENOUS | Status: DC | PRN
  Filled 2013-09-06: qty 250

## 2013-09-06 MED ORDER — METFORMIN HCL 500 MG PO TABS
500.0000 mg | ORAL_TABLET | Freq: Two times a day (BID) | ORAL | Status: DC
  Administered 2013-09-06 – 2013-09-14 (×17): 500 mg via ORAL
  Filled 2013-09-06 (×20): qty 1

## 2013-09-06 MED ORDER — METOPROLOL TARTRATE 1 MG/ML IV SOLN: 2.0000 mg | INTRAVENOUS | Status: DC | PRN

## 2013-09-06 MED ORDER — GABAPENTIN 300 MG PO CAPS
300.0000 mg | ORAL_CAPSULE | Freq: Three times a day (TID) | ORAL | Status: DC
  Administered 2013-09-06 – 2013-09-14 (×26): 300 mg via ORAL
  Filled 2013-09-06 (×27): qty 1

## 2013-09-06 MED ORDER — MAGNESIUM SULFATE 40 MG/ML IJ SOLN
2.0000 g | Freq: Every day | INTRAMUSCULAR | Status: DC | PRN
  Filled 2013-09-06: qty 50

## 2013-09-06 MED ORDER — ASPIRIN-DIPYRIDAMOLE ER 25-200 MG PO CP12
1.0000 | ORAL_CAPSULE | Freq: Two times a day (BID) | ORAL | Status: DC
  Administered 2013-09-06 – 2013-09-14 (×18): 1 via ORAL
  Filled 2013-09-06 (×19): qty 1

## 2013-09-06 MED ORDER — ACETAMINOPHEN 650 MG RE SUPP
325.0000 mg | RECTAL | Status: DC | PRN
Start: 2013-09-06 — End: 2013-09-14
  Filled 2013-09-06: qty 1

## 2013-09-06 MED ORDER — DOCUSATE SODIUM 100 MG PO CAPS
100.0000 mg | ORAL_CAPSULE | Freq: Every day | ORAL | Status: DC
  Administered 2013-09-06 – 2013-09-14 (×9): 100 mg via ORAL
  Filled 2013-09-06 (×9): qty 1

## 2013-09-06 MED ORDER — PANTOPRAZOLE SODIUM 40 MG PO TBEC
40.0000 mg | DELAYED_RELEASE_TABLET | Freq: Every day | ORAL | Status: DC
  Administered 2013-09-06 – 2013-09-14 (×9): 40 mg via ORAL
  Filled 2013-09-06 (×9): qty 1

## 2013-09-06 MED ORDER — DOPAMINE-DEXTROSE 3.2-5 MG/ML-% IV SOLN: 3.0000 ug/kg/min | INTRAVENOUS | Status: DC

## 2013-09-06 MED ORDER — INSULIN DETEMIR 100 UNIT/ML ~~LOC~~ SOLN
10.0000 [IU] | Freq: Every day | SUBCUTANEOUS | Status: DC
  Administered 2013-09-06 – 2013-09-13 (×7): 10 [IU] via SUBCUTANEOUS
  Filled 2013-09-06 (×9): qty 0.1

## 2013-09-06 MED ORDER — SODIUM CHLORIDE 0.9 % IV SOLN: 500.0000 mL | Freq: Once | INTRAVENOUS | Status: AC | PRN

## 2013-09-06 MED ORDER — PHENOL 1.4 % MT LIQD: 1.0000 | OROMUCOSAL | Status: DC | PRN

## 2013-09-06 MED ORDER — POTASSIUM CHLORIDE CRYS ER 20 MEQ PO TBCR: 20.0000 meq | EXTENDED_RELEASE_TABLET | Freq: Every day | ORAL | Status: DC | PRN

## 2013-09-06 NOTE — Progress Notes (Signed)
Changed left lateral leg dressing. Wet 4 by 4, covered by dry 4 by 4, absorbant pad, Kerlex, and Ace wrap. Patient tolerated well. Will continue to monitor. Call bell within reach.   Domingo Dimes RN

## 2013-09-06 NOTE — Progress Notes (Signed)
   VASCULAR PROGRESS NOTE  SUBJECTIVE: Pain adequately controlled.  PHYSICAL EXAM: Filed Vitals:   09/06/13 0432 09/06/13 0500 09/06/13 0600 09/06/13 0700  BP: 161/56 157/57 163/60 154/53  Pulse: 80 72 75 72  Temp: 98 F (36.7 C)     TempSrc: Oral     Resp: 18 17 0 18  Height:      Weight:      SpO2: 98% 94% 95% 96%   Moderate swelling laterally so I removed the staples and placed a dressing. Small amount of drainage from the the left groin which is chronic. Doppler PT and DP signal.  1st and 2nd toes ischemic.  LABS: Lab Results  Component Value Date   WBC 8.6 09/06/2013   HGB 9.1* 09/06/2013   HCT 30.0* 09/06/2013   MCV 75.8* 09/06/2013   PLT 223 09/06/2013   Lab Results  Component Value Date   CREATININE 1.13 09/06/2013   Lab Results  Component Value Date   INR 1.14 09/05/2013   CBG (last 3)   Recent Labs  09/05/13 2246 09/06/13 0010  GLUCAP 140* 126*   Active Problems:   Ischemic leg  ASSESSMENT AND PLAN:  * 1 Day Post-Op s/p: thrombectomy of left femoropopliteal bypass graft, bovine pericardial patch of distal bypass graft, intraoperative arteriogram, 4 compartment fasciotomy.  * Could consider Coumadin to help keep graft open, but he refuses. Also would be at increased risk for bleeding from chronic wound left groin.   * VAC to medial fasciotomy. Start VAC to lateral fasciotomy later next week hopefully.   * He says he was on Keflex, so I will start that.  * Transfer to 2000.   Gae Gallop Beeper: [phone] 09/06/2013

## 2013-09-06 NOTE — Evaluation (Signed)
Physical Therapy Evaluation Patient Details Name: Donald [name] MRN: [id number] DOB: [birth year] Today's Date: 09/06/2013   History of Present Illness  Patient is a 68 yo male with complicated vascular history s/p trombectomy of Left Femoral-Below Knee Popliteal Bypass Graft; Patch Angioplasty of Distal Portion of Bypass Graft (Left) Patch Angioplasty Distal Portion of Left Femoral-Below Knee Popliteal Bypass Graft (Left) and fasciotomy (Left) (Of note, hx of Left side weakness from prior CVA)  Clinical Impression  Patient demonstrates deficits in mobility as indicated below. Will need skilled PT to address deficits and maximize function. Will see as indicated and progress as tolerated.  Educated patient on positioning and edema control.    Follow Up Recommendations CIR;Supervision/Assistance - 24 hour    Equipment Recommendations  Other (comment) (TBD)    Recommendations for Other Services Rehab consult     Precautions / Restrictions Precautions Precautions: Fall Restrictions Weight Bearing Restrictions: No      Mobility  Bed Mobility Overal bed mobility: Needs Assistance Bed Mobility: Supine to Sit;Sit to Supine     Supine to sit: Min assist Sit to supine: Min assist   General bed mobility comments: VCs for positioning, assist to pull to EOB heavy reliance on rail, assist to come to upright at EOB, Assist for positioning upon return to bed  Transfers Overall transfer level: Needs assistance   Transfers: Sit to/from Stand;Stand Pivot Transfers Sit to Stand: Mod assist;+2 physical assistance Stand pivot transfers: Mod assist;+2 physical assistance       General transfer comment: pt able to bear weight through RLE but had significant pain in LLE, increased assist with face to face +2 transfer (transfer to and from chair to bed (x2))  Ambulation/Gait             General Gait Details: unable to tolerate at this time  Stairs            Wheelchair  Mobility    Modified Rankin (Stroke Patients Only)       Balance                                             Pertinent Vitals/Pain 8/10    Home Living Family/patient expects to be discharged to:: Private residence Living Arrangements: Spouse/significant other Available Help at Discharge: Family (wife and sister in law can provide help as well) Type of Home: House Home Access: Stairs to enter Entrance Stairs-Rails: Psychiatric nurse of Steps: 16 steps to enter the home (1 step in back) Home Layout: Two level;Bed/bath upstairs Home Equipment: Walker - 2 wheels;Cane - single point;Bedside commode;Shower seat;Hand held shower head;Grab bars - tub/shower Additional Comments: Pt has a stair chair lift now for going upstairs.     Prior Function Level of Independence: Independent with assistive device(s)         Comments: used cane PTA, has been using RW since recent admit for fempop BPG but wants to return to cane     Hand Dominance   Dominant Hand: Right    Extremity/Trunk Assessment   Upper Extremity Assessment: LUE deficits/detail       LUE Deficits / Details: residual weakness from prior stroke   Lower Extremity Assessment: LLE deficits/detail   LLE Deficits / Details: hx of weakness from prior CVA     Communication      Cognition Arousal/Alertness: Awake/alert Behavior During Therapy: [facility] for  tasks assessed/performed Overall Cognitive Status: Within Functional Limits for tasks assessed                      General Comments General comments (skin integrity, edema, etc.): wound Vac on LLE at this time, pt reports increase throbbing with dependent positioning    Exercises        Assessment/Plan    PT Assessment Patient needs continued PT services  PT Diagnosis Difficulty walking;Abnormality of gait;Generalized weakness;Acute pain;Hemiplegia non-dominant side   PT Problem List Decreased strength;Decreased  range of motion;Decreased activity tolerance;Decreased balance;Decreased mobility;Pain  PT Treatment Interventions DME instruction;Gait training;Stair training;Functional mobility training;Therapeutic activities;Therapeutic exercise;Patient/family education   PT Goals (Current goals can be found in the Care Plan section) Acute Rehab PT Goals Patient Stated Goal: to not hurt so much PT Goal Formulation: With patient Time For Goal Achievement: 09/13/13 Potential to Achieve Goals: Good    Frequency Min 3X/week   Barriers to discharge Inaccessible home environment multiple steps    Co-evaluation               End of Session Equipment Utilized During Treatment: Gait belt Activity Tolerance: Patient limited by pain Patient left: in bed;with call bell/phone within reach Nurse Communication: Mobility status         Time: 1012-1037 PT Time Calculation (min): 25 min   Charges:   PT Evaluation $Initial PT Evaluation Tier I: 1 Procedure PT Treatments $Therapeutic Activity: 23-37 mins   PT G Codes:          Duncan Dull 09/06/2013, 11:19 AM Alben Deeds, PT DPT  [phone]

## 2013-09-07 ENCOUNTER — Encounter (HOSPITAL_COMMUNITY): Payer: Self-pay | Admitting: Vascular Surgery

## 2013-09-07 LAB — GLUCOSE, CAPILLARY
GLUCOSE-CAPILLARY: 107 mg/dL — AB (ref 70–99)
Glucose-Capillary: 137 mg/dL — ABNORMAL HIGH (ref 70–99)
Glucose-Capillary: 140 mg/dL — ABNORMAL HIGH (ref 70–99)
Glucose-Capillary: 136 mg/dL — ABNORMAL HIGH (ref 70–99)

## 2013-09-07 NOTE — Consult Note (Signed)
WOC wound consult note Reason for Consult: Consult requested for left leg with 2 fasciotomy sites. Wound type:Full thickness Measurement: Inner leg 20X6X1cm  Outer leg 25X3.5X.5cm Wound bed:Both sites beefy red with exposed muscles and tendons Drainage (amount, consistency, odor)Mod amt pink drainage in cannister, no odor  Periwound: Intact skin surrounding Dressing procedure/placement/frequency: Applied Mepitel contact layer to each wound bed and one piece black foam to inner wound, 2 pieces black foam to outer wound.  Bridged together with one track pad to 145mm cont suction.   Pt medicated for pain prior to procedure and tolerated with minimal discomfort.  Plan for dressing change on Wed. Julien Girt MSN, RN, Luis Lopez, [place], [place]

## 2013-09-07 NOTE — Progress Notes (Signed)
Rehab Admissions Coordinator Note:  Patient was screened by [name] for appropriateness for an Inpatient Acute Rehab Consult.  At this time, we are recommending Inpatient Rehab consult.  Donald [name] 09/07/2013, 8:40 AM  I can be reached at [phone].

## 2013-09-07 NOTE — Evaluation (Signed)
Occupational Therapy Evaluation Patient Details Name: Donald [name] MRN: [id number] DOB: [birth year] Today's Date: 09/07/2013    History of Present Illness Patient is a 68 yo male with complicated vascular history s/p trombectomy of Left Femoral-Below Knee Popliteal Bypass Graft; Patch Angioplasty of Distal Portion of Bypass Graft (Left) Patch Angioplasty Distal Portion of Left Femoral-Below Knee Popliteal Bypass Graft (Left) and fasciotomy (Left)   Clinical Impression   Patient is s/p Lt femoral below knee popliteal bypass graft with fasciotomy surgery resulting in functional limitations due to the deficits listed below (see OT problem list). Pt currently with LT LE wound vac and Lt residual deficits. Patient will benefit from skilled OT acutely to increase independence and safety with ADLS to allow discharge CIR.     Follow Up Recommendations  CIR    Equipment Recommendations  Other (comment) (defer CIR)    Recommendations for Other Services Rehab consult     Precautions / Restrictions Precautions Precautions: Fall Restrictions Weight Bearing Restrictions: No      Mobility Bed Mobility Overal bed mobility: Needs Assistance Bed Mobility: Sit to Supine     Supine to sit: Min assist Sit to supine: Min assist   General bed mobility comments: (A) to left BIL LE into bed  Transfers Overall transfer level: Needs assistance Equipment used: Straight cane Transfers: Stand Pivot Transfers Sit to Stand: Min assist;+2 physical assistance;+2 safety/equipment Stand pivot transfers: Min assist;+2 physical assistance       General transfer comment: Pt transfering to the left with cane    Balance Overall balance assessment: Needs assistance Sitting-balance support: Feet supported Sitting balance-Leahy Scale: Fair     Standing balance support: Bilateral upper extremity supported;During functional activity Standing balance-Leahy Scale: Poor Standing balance comment:  Needed UE support to stand with RW.  Unsteady due to pt not weight bearing fully on left LE due to pain.                              ADL Overall ADL's : Needs assistance/impaired Eating/Feeding: Set up;Sitting                       Toilet Transfer: +2 for physical assistance;Minimal assistance;Stand-pivot           Functional mobility during ADLs: Minimal assistance;+2 for physical assistance General ADL Comments: Pt reports extreme pain first attempt. PT arriving prior to OT return and pt repositioned in chair. OT returning and pt declined OT at that time due to pain. OT returning and pt agreeable to mobility back to bed. Pt demonstrates use of cane and stand pivot to bed. Pt weight bearing on LT LE this attempt.     Vision                     Perception     Praxis      Pertinent Vitals/Pain Premedicated      Hand Dominance Right   Extremity/Trunk Assessment Upper Extremity Assessment Upper Extremity Assessment: LUE deficits/detail LUE Deficits / Details: resiual deficits- PROM Digits full ROM, grasp 2+ out 5, no supination/ pronation, shoulder flexion AROM ~70 degrees, shoulder abduction ~40 degrees, tone noted   Lower Extremity Assessment Lower Extremity Assessment: Defer to PT evaluation   Cervical / Trunk Assessment Cervical / Trunk Assessment: Normal   Communication Communication Communication: No difficulties   Cognition Arousal/Alertness: Awake/alert Behavior During Therapy: WFL for tasks assessed/performed Overall Cognitive Status:  Within Functional Limits for tasks assessed                     General Comments       Exercises Exercises: General Lower Extremity     Shoulder Instructions      Home Living Family/patient expects to be discharged to:: Private residence Living Arrangements: Spouse/significant other Available Help at Discharge: Family Type of Home: House Home Access: Stairs to enter [employer] of Steps: 16 steps to enter the home Entrance Stairs-Rails: Right;Left Home Layout: Two level;Bed/bath upstairs Alternate Level Stairs-Number of Steps: flight, patient has lift chair Alternate Level Stairs-Rails: Right;Left Bathroom Shower/Tub: Walk-in shower;Door   [employer] Toilet: Handicapped height Bathroom Accessibility: Yes How Accessible: Accessible via walker Home Equipment: [place] - 2 wheels;Cane - single point;Bedside commode;Shower seat;Hand held shower head;Grab bars - tub/shower   Additional Comments: Pt has a stair chair lift now for going upstairs.       Prior Functioning/Environment Level of Independence: Independent with assistive device(s)        Comments: used cane PTA, has been using RW since recent admit for fempop BPG but wants to return to cane    OT Diagnosis: Generalized weakness;Acute pain   OT Problem List: Decreased strength;Decreased activity tolerance;Impaired balance (sitting and/or standing);Decreased safety awareness;Decreased knowledge of use of DME or AE;Decreased knowledge of precautions;Pain;Impaired tone;Impaired UE functional use   OT Treatment/Interventions: Self-care/ADL training;Therapeutic exercise;DME and/or AE instruction;Therapeutic activities;Patient/family education;Balance training    OT Goals(Current goals can be found in the care plan section) Acute Rehab OT Goals Patient Stated Goal: to do what the doctor tells me to do OT Goal Formulation: With patient Time For Goal Achievement: 09/21/13 Potential to Achieve Goals: Good  OT Frequency: Min 2X/week   Barriers to D/C:            Co-evaluation              End of Session Nurse Communication: Mobility status;Precautions  Activity Tolerance: Patient tolerated treatment well Patient left: in bed;with call bell/phone within reach   Time: 1314 (1109-1112)-1324 (1027-1030) OT Time Calculation (min): 10 min Charges:  OT General Charges $OT Visit: 1  Procedure OT Evaluation $Initial OT Evaluation Tier I: 1 Procedure OT Treatments $Self Care/Home Management : 8-22 mins G-Codes:    Peri Maris 09-17-13, 3:02 PM Pager: [phone]

## 2013-09-07 NOTE — Progress Notes (Addendum)
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 Pt currently with functional limitations due to balance and endurance deficits. Pt will benefit from skilled PT to increase their independence and safety with mobility to allow discharge to the venue listed below.   Follow Up Recommendations  CIR;Supervision/Assistance - 24 hour     Equipment Recommendations  Other (comment) (TBA)    Recommendations for Other Services Rehab consult     Precautions / Restrictions Precautions Precautions: Fall Restrictions Weight Bearing Restrictions: No    Mobility  Bed Mobility Overal bed mobility: Needs Assistance Bed Mobility: Supine to Sit;Sit to Supine     Supine to sit: Min assist     General bed mobility comments: VCs for positioning, assist to pull to EOB heavy reliance on rail, assist to come to upright at EOB, Assist for positioning upon return to bed  Transfers Overall transfer level: Needs assistance Equipment used: Rolling walker (2 wheeled) Transfers: Sit to/from [employer] Sit to Stand: Mod assist;+2 physical assistance Stand pivot transfers: Mod assist;+2 physical assistance       General transfer comment: pt able to bear weight through RLE but had significant pain in LLE.  Was able to use RW to standand pivot but had difficutly with weight shifting and moving RW.    Ambulation/Gait                 Stairs            Wheelchair Mobility    Modified  Rankin (Stroke Patients Only)       Balance Overall balance assessment: Needs assistance;History of Falls Sitting-balance support: No upper extremity supported;Feet supported Sitting balance-Leahy Scale: Fair     Standing balance support: Bilateral upper extremity supported;During functional activity Standing balance-Leahy Scale: Poor Standing balance comment: Needed UE support to stand with RW.  Unsteady due to pt not weight bearing fully on left LE due to pain.                      Cognition Arousal/Alertness: Awake/alert Behavior During Therapy: WFL for tasks assessed/performed Overall Cognitive Status: Within Functional Limits for tasks assessed                      Exercises General Exercises - Lower Extremity Ankle Circles/Pumps: AROM;Both;10 reps;Supine Long Arc Quad: AAROM;10 reps;Seated;Both    General Comments General comments (skin integrity, edema, etc.): wound vac LLE      Pertinent Vitals/Pain VSS,  Pain LLE but not rated.    Home Living                      Prior Function            PT Goals (current goals can now be found in the care plan section) Progress towards PT goals: Progressing toward goals    Frequency  Min 3X/week    PT Plan Current plan remains appropriate    Co-evaluation  End of Session Equipment Utilized During Treatment: Gait belt Activity Tolerance: Patient limited by pain Patient left: with call bell/phone within reach;in chair     Time: 6945-0388 PT Time Calculation (min): 24 min  Charges:  $Therapeutic Exercise: 8-22 mins $Therapeutic Activity: 8-22 mins                    G Codes:      Donald [name] Sep 25, 2013, 1:12 PM [facility] Acute Rehabilitation [phone] (pager)

## 2013-09-07 NOTE — Progress Notes (Signed)
[facility] [place]  Subjective  - Doing ok over all.  States Dr. Scot Dock is going to place a wound VAC on both sides of his leg.     Objective 121/62 72 97.8 F (36.6 C) (Oral) 18 98%  Intake/Output Summary (Last 24 hours) at 09/07/13 1029 Last data filed at 09/07/13 0447  Gross per 24 hour  Intake    360 ml  Output   1550 ml  Net  -1190 ml   Left leg with mod edema, no change in 1 st and 2 nd toes. Doppler PT/DP   Assessment/Planning: POD #2 PROCEDURE:  1. Thrombectomy of left femoropopliteal bypass graft  2. Bovine pericardial patch of distal bypass graft  3. Intraoperative arteriogram  4. 4 compartment fasciotomy   Will have wound care nurse apply wound vac both medial and laterally when it is charged. to left leg.  Pre-op anemia received 670 ml of PRBC intraoperative. HGB 9.1  Ulyses Amor 09/07/2013 10:29 AM --  Laboratory Lab Results:  Recent Labs  09/05/13 1805 09/06/13 0005  WBC 10.4 8.6  HGB 7.9* 9.1*  HCT 27.8* 30.0*  PLT 258 223   BMET  Recent Labs  09/05/13 1805 09/06/13 0005 09/06/13 0845  NA 135*  --  139  K 4.5  --  4.8  CL 98  --  105  CO2 19  --  21  GLUCOSE 153*  --  180*  BUN 21  --  17  CREATININE 1.16 1.13 1.13  CALCIUM 9.5  --  7.9*    COAG Lab Results  Component Value Date   INR 1.14 09/05/2013   INR 1.01 09/04/2013   INR 0.91 04/09/2013   No results found for this basename: PTT

## 2013-09-07 NOTE — Progress Notes (Signed)
   VASCULAR PROGRESS NOTE  SUBJECTIVE: Moderate discomfort  PHYSICAL EXAM: Filed Vitals:   09/06/13 1225 09/06/13 1504 09/06/13 2056 09/07/13 0446  BP: 131/51 120/51 114/54 121/62  Pulse: 72 75 75 72  Temp: 98.6 F (37 C) 98.3 F (36.8 C) 97.6 F (36.4 C) 97.8 F (36.6 C)  TempSrc: Oral Oral Oral Oral  Resp: 18 18 18 18   Height:      Weight:      SpO2: 95% 95% 97% 98%   Brisk PT signal with doppler Toes ischemic Small amount of drainage to left groin. VAC on medial fasciotomy site Lateral fasciotomy site open  LABS: Lab Results  Component Value Date   WBC 8.6 09/06/2013   HGB 9.1* 09/06/2013   HCT 30.0* 09/06/2013   MCV 75.8* 09/06/2013   PLT 223 09/06/2013   Lab Results  Component Value Date   CREATININE 1.13 09/06/2013   Lab Results  Component Value Date   INR 1.14 09/05/2013   CBG (last 3)   Recent Labs  09/06/13 1640 09/06/13 2129 09/07/13 0616  GLUCAP 126* 131* 137*    Active Problems:   Ischemic leg  ASSESSMENT AND PLAN:  * 2 Days Post-Op s/p: thrombectomy of left femoropopliteal bypass graft, bovine pericardial patch of distal bypass graft, intraoperative arteriogram, 4 compartment fasciotomy.   *VAC change today. Apply VAC to both medial and lateral fasciotomies.   * Continue po Keflex and dressing changes to left groin.    * PTx  Gae Gallop Beeper: [phone] 09/07/2013

## 2013-09-08 LAB — GLUCOSE, CAPILLARY
GLUCOSE-CAPILLARY: 100 mg/dL — AB (ref 70–99)
Glucose-Capillary: 116 mg/dL — ABNORMAL HIGH (ref 70–99)
Glucose-Capillary: 106 mg/dL — ABNORMAL HIGH (ref 70–99)
Glucose-Capillary: 150 mg/dL — ABNORMAL HIGH (ref 70–99)
Glucose-Capillary: 106 mg/dL — ABNORMAL HIGH (ref 70–99)

## 2013-09-08 NOTE — Progress Notes (Addendum)
  [facility] Progress Note  09/08/2013 7:21 AM 3 Days Post-Op  Subjective:  States he is feeling better today.  Ready to mobilize more.  Afebrile x 24 hrs VSS 100% RA  Filed Vitals:   09/08/13 0601  BP: 135/65  Pulse: 97  Temp: 98.7 F (37.1 C)  Resp: 20    Physical Exam: Incisions:  Bilateral fasciotomy sites with wound vacs in place with good seal.  Left groin still with drainage. Extremities:  Toes continue to be ischemic-same as yesterday; monophasic left PT/DP  Intake/Output Summary (Last 24 hours) at 09/08/13 0721 Last data filed at 09/08/13 0603  Gross per 24 hour  Intake    900 ml  Output   1800 ml  Net   -900 ml    Assessment:  68 y.o. male is s/p:  1. Thrombectomy of left femoropopliteal bypass graft  2. Bovine pericardial patch of distal bypass graft  3. Intraoperative arteriogram  4. 4 compartment fasciotomy  3 Days Post-Op  Plan: -continue wound vac-per WOC at vac change yesterday, both sites are beefy red -continue dressing changes to left groin -DVT prophylaxis:  Lovenox Hgb A1c 7.7, which is down from December at 8.4-continue good control for wound healing -pt ready to mobilize more today.  Increase mobilization with PT today. OOB to chair. -continue Keflex for left groin wound infection  Leontine Locket, PA-C [facility] [phone] 09/08/2013 7:21 AM  Agree with above.  Will try to look at fasciotomy sites early Friday and decide if they could be closed Friday.  Deitra Mayo, MD, [place] [phone] 09/08/2013

## 2013-09-08 NOTE — Consult Note (Signed)
Physical Medicine and Rehabilitation Consult Reason for Consult: Left femoral below knee popliteal bypass/history CVA Referring Physician: Dr. Scot Dock   HPI: Donald [name] is a 68 y.o. right-handed male with history of CVA left-sided weakness 2008 and received inpatient rehabilitation services, hypertension, diabetes mellitus peripheral neuropathy and peripheral vascular disease and multiple revascularization procedures. Patient independent with a straight cane prior to admission living with his wife. Admitted 09/05/2013 with ischemic changes left leg as well as recent fall. X-rays did not reveal any fracture. Noted to be significantly anemic 7.5 and refused transfusion. Findings of occluded graft underwent thrombectomy of left femoral-popliteal bypass graft as well as 4 compartment fasciotomy 09/05/2013 per Dr. Scot Dock. Postoperative pain management. Maintained on Aggrenox for history of CVA as well as subcutaneous Lovenox for DVT prophylaxis. Hemoglobin remained stable his latest hemoglobin 9.1. Hemoglobin A1c 7.7 with insulin therapy as directed. Wound care nurse followup for fasciotomy sites. Physical therapy evaluation completed 09/07/2013 with recommendations of physical medicine rehabilitation consult  Prior CIR stay 2008 for R MCA infarct  Review of Systems  Gastrointestinal: Positive for constipation.  Neurological: Positive for speech change and weakness.  Psychiatric/Behavioral: Positive for depression.  All other systems reviewed and are negative.  Past Medical History  Diagnosis Date  . GERD (gastroesophageal reflux disease)   . Hyperlipidemia   . Hypertension   . Peripheral vascular disease   . Internal and external bleeding hemorrhoids   . History of gout     left great toe  . Depression   . Numbness in right leg     Hx: of diabetes  . Coronary artery disease     RCA occlusion with good collaterals, o/w no obs CAD 12/2011  . Pneumonia ~ 1977  . Type II  diabetes mellitus   . History of blood transfusion 12/2011; 12/2012    "before OR; after OR" (01/22/2013)  . Stroke 2008    "left side weak; unable to move left hand still" (01/22/2013)  . Rectal cancer dx'd 08/2007    S/P chemo, radiation, biopsies  . Metastases to the liver dx'd 08/2010   Past Surgical History  Procedure Laterality Date  . Carotid endarterectomy Right 2008  . Liver cryoablation  08/2010    "chemo shrunk the cancer 50% then they went in and burned the rest of it"  . Axillary-femoral bypass graft  01/29/2012    Procedure: BYPASS GRAFT AXILLA-BIFEMORAL;  Surgeon: Elam Dutch, MD;  Location: [facility];  Service: Vascular;  Laterality: N/A;  Left axilla-bifemoral bypass using gore-tex graft.   . Colonoscopy w/ biopsies and polypectomy      Hx: of  . Femoral-popliteal bypass graft Left 01/06/2013    Procedure: Femoral-Popliteal Artery Bypass;  Surgeon: Elam Dutch, MD;  Location: [place];  Service: Vascular;  Laterality: Left;  . Intraoperative arteriogram Left 01/06/2013    Procedure: INTRA OPERATIVE ARTERIOGRAM;  Surgeon: Elam Dutch, MD;  Location: [place];  Service: Vascular;  Laterality: Left;  . Femoral-popliteal bypass graft Left 01/10/2013    Procedure: THROMBECTOMY POSSIBLE BYPASS GRAFT FEMORAL-POPLITEAL ARTERY;  Surgeon: Mal Misty, MD;  Location: [place];  Service: Vascular;  Laterality: Left;  Attempted Thrombectomy of left Femoral/Popliteal graft - Unsuccessful.  Insertion of Left femoral to below the knee popliteal propaten graft.  . Mass biopsy  2009    "found mass that was cancer; later had chemo and radiation; never cut mass out" (01/22/2013)  . Femoral-popliteal bypass graft Left 09/05/2013  Procedure: Thrombectomy of Left Femoral-Below Knee Popliteal Bypass Graft; Patch Angioplasty of Distal Portion of Bypass Graft;  Surgeon: Angelia Mould, MD;  Location: [place];  Service: Vascular;  Laterality: Left;  . Patch angioplasty Left 09/05/2013    Procedure:  Patch Angioplasty Distal Portion of Left Femoral-Below Knee Popliteal Bypass Graft;  Surgeon: Angelia Mould, MD;  Location: [place];  Service: Vascular;  Laterality: Left;  . Intraoperative arteriogram Left 09/05/2013    Procedure: INTRA OPERATIVE ARTERIOGRAM;  Surgeon: Angelia Mould, MD;  Location: [place];  Service: Vascular;  Laterality: Left;  . Fasciotomy Left 09/05/2013    Procedure: FASCIOTOMY;  Surgeon: Angelia Mould, MD;  Location: [facility] OR;  Service: Vascular;  Laterality: Left;   Family History  Problem Relation Age of Onset  . Cancer - Other Sister    Social History:  reports that he has quit smoking. His smoking use included Cigarettes. He has a 5.7 pack-year smoking history. He has never used smokeless tobacco. He reports that he does not drink alcohol or use illicit drugs. Allergies:  Allergies  Allergen Reactions  . Other     Became hypotensive. Pt states allergic to "muscle relaxer" but unsure of name.  . Tape Hives and Itching    medfix non woven tape   Medications Prior to Admission  Medication Sig Dispense Refill  . Ascorbic Acid (VITAMIN C) 1000 MG tablet Take 500 mg by mouth daily.       Marland Kitchen dipyridamole-aspirin (AGGRENOX) 200-25 MG per 12 hr capsule Take 1 capsule by mouth 2 (two) times daily.      Marland Kitchen docusate sodium (COLACE) 100 MG capsule Take 200 mg by mouth 2 (two) times daily as needed for mild constipation.      . gabapentin (NEURONTIN) 300 MG capsule Take 1 capsule (300 mg total) by mouth 3 (three) times daily.  90 capsule  6  . insulin detemir (LEVEMIR) 100 UNIT/ML injection Inject 10 Units into the skin daily.      Marland Kitchen lisinopril (PRINIVIL,ZESTRIL) 5 MG tablet Take 2.5 mg by mouth daily.      . mesalamine (CANASA) 1000 MG suppository Place 1 suppository (1,000 mg total) rectally at bedtime.  15 suppository  2  . metFORMIN (GLUCOPHAGE) 500 MG tablet Take 500 mg by mouth 2 (two) times daily with a meal.      . omega-3 acid ethyl esters (LOVAZA) 1 G  capsule Take 2 g by mouth 2 (two) times daily.       Marland Kitchen omeprazole (PRILOSEC) 40 MG capsule Take 40 mg by mouth daily.      Marland Kitchen oxyCODONE-acetaminophen (PERCOCET) 5-325 MG per tablet Take 1 tablet by mouth every 4 (four) hours as needed.  20 tablet  0  . Probiotic Product (ALIGN PO) Take 1 tablet by mouth at bedtime.        Home: Home Living Family/patient expects to be discharged to:: Private residence Living Arrangements: Spouse/significant other Available Help at Discharge: Family Type of Home: House Home Access: Stairs to enter [employer] of Steps: 16 steps to enter the home Entrance Stairs-Rails: [place]: Two level;Bed/bath upstairs Alternate Level Stairs-Number of Steps: flight, patient has lift chair Alternate Level Stairs-Rails: Right;Left Home Equipment: Walker - 2 wheels;Cane - single point;Bedside commode;Shower seat;Hand held shower head;Grab bars - tub/shower Additional Comments: Pt has a stair chair lift now for going upstairs.   Functional History: Prior Function Level of Independence: Independent with assistive device(s) Comments: used cane PTA, has  been using RW since recent admit for fempop BPG but wants to return to cane Functional Status:  Mobility: Bed Mobility Overal bed mobility: Needs Assistance Bed Mobility: Sit to Supine Supine to sit: Min assist Sit to supine: Min assist General bed mobility comments: (A) to left BIL LE into bed Transfers Overall transfer level: Needs assistance Equipment used: Straight cane Transfers: Stand Pivot Transfers Sit to Stand: Min assist;+2 physical assistance;+2 safety/equipment Stand pivot transfers: Min assist;+2 physical assistance General transfer comment: Pt transfering to the left with cane Ambulation/Gait General Gait Details: unable to tolerate at this time    ADL: ADL Overall ADL's : Needs assistance/impaired Eating/Feeding: Set up;Sitting Toilet Transfer: +2 for physical  assistance;Minimal assistance;Stand-pivot Functional mobility during ADLs: Minimal assistance;+2 for physical assistance General ADL Comments: Pt reports extreme pain first attempt. PT arriving prior to OT return and pt repositioned in chair. OT returning and pt declined OT at that time due to pain. OT returning and pt agreeable to mobility back to bed. Pt demonstrates use of cane and stand pivot to bed. Pt weight bearing on LT LE this attempt.  Cognition: Cognition Overall Cognitive Status: Within Functional Limits for tasks assessed Orientation Level: Oriented X4 Cognition Arousal/Alertness: Awake/alert Behavior During Therapy: WFL for tasks assessed/performed Overall Cognitive Status: Within Functional Limits for tasks assessed  Blood pressure 124/66, pulse 83, temperature 98.5 F (36.9 C), temperature source Oral, resp. rate 19, height 5\' 9"  (1.753 m), weight 78.2 kg (172 lb 6.4 oz), SpO2 100.00%. Physical Exam  Vitals reviewed. HENT:  Head: Normocephalic.  Eyes: EOM are normal.  Neck: Normal range of motion. Neck supple. No thyromegaly present.  Cardiovascular: Normal rate and regular rhythm.   Respiratory: Effort normal and breath sounds normal. No respiratory distress.  GI: Soft. Bowel sounds are normal. He exhibits no distension.  Musculoskeletal:  Left-sided weakness from history of CVA  Neurological: He is alert.  Speech is dysarthric but intelligible. He is oriented x3 and follows basic commands  Skin:  Wound VAC with dressing in place left lower extremity   motor strength is 5/5 in the right deltoid, bicep, tricep, grip, hip flexors, knee extensors, ankle dorsiflexion 3 minus in the left deltoid, bicep, tricep, and grip with increased tone in the finger and wrist flexors Ashworth grade 2-3 2 minus in the left ankle dorsiflexor 3 minus in the knee extensor and hip flexor. Decreased sensation left hand Visual fields intact confrontational testing  Results for orders  placed during the hospital encounter of 09/05/13 (from the past 24 hour(s))  GLUCOSE, CAPILLARY     Status: Abnormal   Collection Time    09/07/13  4:18 PM      Result Value Ref Range   Glucose-Capillary 136 (*) 70 - 99 mg/dL   Comment 1 Notify RN     Comment 2 Documented in Chart    GLUCOSE, CAPILLARY     Status: Abnormal   Collection Time    09/07/13  8:38 PM      Result Value Ref Range   Glucose-Capillary 107 (*) 70 - 99 mg/dL   Comment 1 Documented in Chart     Comment 2 Notify RN    GLUCOSE, CAPILLARY     Status: Abnormal   Collection Time    09/08/13  6:04 AM      Result Value Ref Range   Glucose-Capillary 100 (*) 70 - 99 mg/dL   Comment 1 Documented in Chart     Comment 2 Notify RN  GLUCOSE, CAPILLARY     Status: Abnormal   Collection Time    09/08/13  7:42 AM      Result Value Ref Range   Glucose-Capillary 116 (*) 70 - 99 mg/dL  GLUCOSE, CAPILLARY     Status: Abnormal   Collection Time    09/08/13 11:42 AM      Result Value Ref Range   Glucose-Capillary 106 (*) 70 - 99 mg/dL   Comment 1 Notify RN     Comment 2 Documented in Chart     No results found.  Assessment/Plan: Diagnosis: Deconditioning after femoropopliteal bypass as well as fasciotomy requiring wound vac in a patient with chronic left hemiparesis 1. Does the need for close, 24 hr/day medical supervision in concert with the patient's rehab needs make it unreasonable for this patient to be served in a less intensive setting? Yes 2. Co-Morbidities requiring supervision/potential complications: Peripheral vascular disease, diabetes 3. Due to bladder management, bowel management, safety, skin/wound care, disease management, medication administration, pain management and patient education, does the patient require 24 hr/day rehab nursing? Yes 4. Does the patient require coordinated care of a physician, rehab nurse, PT (1-2 hrs/day, 5 days/week) and OT (1-2 hrs/day, 5 days/week) to address physical and  functional deficits in the context of the above medical diagnosis(es)? Potentially Addressing deficits in the following areas: balance, endurance, locomotion, strength, transferring, bowel/bladder control, bathing, dressing and toileting 5. Can the patient actively participate in an intensive therapy program of at least 3 hrs of therapy per day at least 5 days per week? Yes 6. The potential for patient to make measurable gains while on inpatient rehab is good 7. Anticipated functional outcomes upon discharge from inpatient rehab are modified independent  with PT, modified independent with OT, n/a with SLP. 8. Estimated rehab length of stay to reach the above functional goals is: 7-10 days 9. Does the patient have adequate social supports to accommodate these discharge functional goals? Potentially 10. Anticipated D/C setting: Home 11. Anticipated post D/C treatments: [place] therapy 12. Overall Rehab/Functional Prognosis: good  RECOMMENDATIONS: This patient's condition is appropriate for continued rehabilitative care in the following setting: CIR Patient has agreed to participate in recommended program. Potentially Note that insurance prior authorization may be required for reimbursement for recommended care.  Comment: Patient states that wife can assist him at home

## 2013-09-09 DIAGNOSIS — R5381 Other malaise: Secondary | ICD-10-CM

## 2013-09-09 DIAGNOSIS — G8110 Spastic hemiplegia affecting unspecified side: Secondary | ICD-10-CM

## 2013-09-09 LAB — TYPE AND SCREEN
ABO/RH(D): O POS
ANTIBODY SCREEN: NEGATIVE
UNIT DIVISION: 0
Unit division: 0
Unit division: 0
Unit division: 0

## 2013-09-09 LAB — GLUCOSE, CAPILLARY
GLUCOSE-CAPILLARY: 127 mg/dL — AB (ref 70–99)
GLUCOSE-CAPILLARY: 97 mg/dL (ref 70–99)
GLUCOSE-CAPILLARY: 105 mg/dL — AB (ref 70–99)
Glucose-Capillary: 108 mg/dL — ABNORMAL HIGH (ref 70–99)

## 2013-09-09 MED ORDER — BISACODYL 10 MG RE SUPP
10.0000 mg | Freq: Every day | RECTAL | Status: DC | PRN
  Administered 2013-09-09: 10 mg via RECTAL
  Filled 2013-09-09 (×2): qty 1

## 2013-09-09 MED ORDER — SODIUM CHLORIDE 0.9 % IJ SOLN
10.0000 mL | INTRAMUSCULAR | Status: DC | PRN
  Administered 2013-09-09 – 2013-09-11 (×3): 20 mL
  Administered 2013-09-11 – 2013-09-12 (×2): 10 mL

## 2013-09-09 NOTE — Progress Notes (Signed)
Pt last BM 5-9; PA called to make aware; order for supp; Dulcolax supp given at this time; will cont. To monitor.

## 2013-09-09 NOTE — Progress Notes (Signed)
Physical Therapy Treatment Patient Details Name: Donald [name] MRN: [id number] DOB: 01-30-1946 Today's Date: 09/09/2013    History of Present Illness Patient is a 68 yo male with complicated vascular history s/p trombectomy of Left Femoral-Below Knee Popliteal Bypass Graft; Patch Angioplasty of Distal Portion of Bypass Graft (Left) Patch Angioplasty Distal Portion of Left Femoral-Below Knee Popliteal Bypass Graft (Left) and fasciotomy (Left)    PT Comments    Patient tolerating weight left LE for two steps to chair.  Will benefit from CIR level rehab to allow maximal mobility prior to d/c home with wife assist.  Follow Up Recommendations  CIR;Supervision/Assistance - 24 hour     Equipment Recommendations  Other (comment) (TBA)    Recommendations for Other Services Rehab consult     Precautions / Restrictions Precautions Precautions: Fall Precaution Comments: wound vac left lower leg Restrictions Weight Bearing Restrictions: No    Mobility  Bed Mobility Overal bed mobility: Modified Independent Bed Mobility: Supine to Sit     Supine to sit: Min guard     General bed mobility comments: left room go get walker, pt sitting edge of bed upon my return  Transfers Overall transfer level: Needs assistance Equipment used: Rolling walker (2 wheeled) Transfers: Sit to/from Stand Sit to Stand: Mod assist (performed x 2) Stand pivot transfers: Mod assist       General transfer comment: increased assist for stability once standing due to right LOB.  Able to take two steps to pivot to chair with c/o severe right LE pain with weight bearing,  Was close to time for meds and RN delivered just prior to transfer  Ambulation/Gait                 Stairs            Wheelchair Mobility    Modified Rankin (Stroke Patients Only)       Balance Overall balance assessment: Needs assistance Sitting-balance support: No upper extremity supported;Feet  supported Sitting balance-Leahy Scale: Good     Standing balance support: Bilateral upper extremity supported;During functional activity Standing balance-Leahy Scale: Poor Standing balance comment: limited tolerance to weight on left LE, loss of balance right with standing                    Cognition Arousal/Alertness: Awake/alert Behavior During Therapy: WFL for tasks assessed/performed Overall Cognitive Status: Within Functional Limits for tasks assessed                      Exercises General Exercises - Lower Extremity Long Arc Quad: AROM;Left;10 reps;Seated Heel Slides: AROM;Left;10 reps;Seated Toe Raises: PROM;Left;20 reps;Seated    General Comments        Pertinent Vitals/Pain C/o severe left LE pain with weight bearing    Home Living                      Prior Function            PT Goals (current goals can now be found in the care plan section) Acute Rehab PT Goals Patient Stated Goal: to do what the doctor tells me to do Progress towards PT goals: Progressing toward goals    Frequency  Min 3X/week    PT Plan Current plan remains appropriate    Co-evaluation             End of Session Equipment Utilized During Treatment: Gait belt Activity Tolerance: Patient limited by pain  Patient left: in chair;with call bell/phone within reach     Time: 1127-1150 PT Time Calculation (min): 23 min  Charges:  $Therapeutic Exercise: 8-22 mins $Therapeutic Activity: 8-22 mins                    G Codes:      Max Sane 09-21-13, 11:57 AM Magda Kiel, PT [phone] 21-Sep-2013

## 2013-09-09 NOTE — Progress Notes (Addendum)
  [facility] Progress Note  09/09/2013 7:29 AM 4 Days Post-Op  Subjective:  Still sore, but a little less than yesterday.  States no one came to help him walk.  Afebrile VSS  Filed Vitals:   09/09/13 0455  BP: 105/32  Pulse: 69  Temp: 97.7 F (36.5 C)  Resp: 17   Physical Exam: Incisions:  Wound vacs in position with good seal. Extremities:  Left foot warm   Intake/Output Summary (Last 24 hours) at 09/09/13 0729 Last data filed at 09/09/13 0457  Gross per 24 hour  Intake    480 ml  Output   1300 ml  Net   -820 ml   Assessment:  68 y.o. male is s/p:  1. Thrombectomy of left femoropopliteal bypass graft  2. Bovine pericardial patch of distal bypass graft  3. Intraoperative arteriogram  4. 4 compartment fasciotomy   4 Days Post-Op  Plan: -vacs for change today. -Dr. Scot Dock to evaluate wounds early Friday to determine if they are closable.  -will get PT to work with pt today -DVT prophylaxis:  Lovenox  Leontine Locket, PA-C [facility] [phone] 09/09/2013 7:29 AM  Agree with above. Looking at the width of the 2 fasciotomy sites, I think it is unlikely that they will be able to be closed on Friday. We'll start making arrangements for VAC at home.  Continue physical therapy. They have recommended rehabilitation consult.  Deitra Mayo, MD, [place] [phone] 09/09/2013

## 2013-09-09 NOTE — Progress Notes (Signed)
Rehab admissions - I met with pt and his family (wife and two sisters-in-law) to discuss the possibility of inpatient rehab. 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   [name] is interested in pursuing inpatient rehab and pt was somewhat ambivalent about the idea. However, pt was not interested in pursuing skilled nursing facility.  We will follow pt's case and consider bringing pt to CIR once medically cleared and pending bed availability. Please call for any questions. Thanks.  Nanetta Batty, PT Rehabilitation Admissions Coordinator [phone]

## 2013-09-09 NOTE — Consult Note (Signed)
WOC wound follow up Wound type: surgical wound/ fasciotomy sites lateral and medial LLE Wound bed: both areas clean, pink, moist, muscle visible and appears viable at both sites. Drainage (amount, consistency, odor) minimal  Periwound:intact Dressing procedure/placement/frequency: 4 pc. Black granufoam used to fill the sites and bridge between the sites.  Protected the skin between the wounds with drape for bridge.  Seal at 121mmHG, pt tolerated without problems.  [facility] team will follow along with your for [facility] changes M/W/F. Fordyce Lepak [place] RN,CWOCN [phone]

## 2013-09-09 NOTE — Progress Notes (Signed)
Occupational Therapy Treatment Patient Details Name: Donald [name] MRN: [id number] DOB: [birth year] Today's Date: 09/09/2013    History of present illness Patient is a 68 yo male with complicated vascular history s/p trombectomy of Left Femoral-Below Knee Popliteal Bypass Graft; Patch Angioplasty of Distal Portion of Bypass Graft (Left) Patch Angioplasty Distal Portion of Left Femoral-Below Knee Popliteal Bypass Graft (Left) and fasciotomy (Left)   OT comments  Pt progressing this session and more motivated now that pain in controlled. OT noted: Lt LE 2nd and 4th toe are black in color. Rn notified by Therapist. Pt demonstrates incr bed mobility this session. Pt remains strong CIR candidate.   Follow Up Recommendations  CIR    Equipment Recommendations  Other (comment)    Recommendations for Other Services Rehab consult    Precautions / Restrictions Precautions Precautions: Fall Restrictions Weight Bearing Restrictions: No       Mobility Bed Mobility Overal bed mobility: Needs Assistance Bed Mobility: Supine to Sit     Supine to sit: Min guard     General bed mobility comments: requires heavy use of bed rails and HOB slightly elevated  Transfers Overall transfer level: Needs assistance Equipment used: Straight cane Transfers: Sit to/from [employer] Transfers Sit to Stand: Mod assist Stand pivot transfers: Max assist       General transfer comment: LOB to the right with transfer. Recommend RN staff use total +2 for safety. OT expressed to RN Roselyn Reef and Tech on for this shift    Balance Overall balance assessment: Needs assistance Sitting-balance support: No upper extremity supported;Feet supported Sitting balance-Leahy Scale: Good     Standing balance support: Bilateral upper extremity supported;During functional activity Standing balance-Leahy Scale: Poor                     ADL Overall ADL's : Needs assistance/impaired Eating/Feeding: Set  up;Bed level   Grooming: Brushing hair;Oral care;Sitting;Set up Grooming Details (indicate cue type and reason): pt progressed to EOB and completing oral care with setup. Pt reports decr use of LT UE compared to baseline                 Toilet Transfer: Moderate assistance;Stand-pivot;BSC (SPC) Toilet Transfer Details (indicate cue type and reason): pt stand pivot to the right         Functional mobility during ADLs: Maximal assistance;Cane General ADL Comments: Pt transfering to the right with SPC. pt with LOB x2 attempting to side step from 3n1 to recliner. pt with uncontrolled descend to chair. pt       Vision                     Perception     Praxis      Cognition   Behavior During Therapy: WFL for tasks assessed/performed Overall Cognitive Status: Within Functional Limits for tasks assessed                       Extremity/Trunk Assessment               Exercises     Shoulder Instructions       General Comments      Pertinent Vitals/ Pain       VSS  Home Living  Prior Functioning/Environment              Frequency Min 2X/week     Progress Toward Goals  OT Goals(current goals can now be found in the care plan section)  Progress towards OT goals: Progressing toward goals  Acute Rehab OT Goals Patient Stated Goal: to do what the doctor tells me to do OT Goal Formulation: With patient Time For Goal Achievement: 09/21/13 Potential to Achieve Goals: Good ADL Goals Pt Will Perform Upper Body Bathing: with set-up;sitting Pt Will Perform Lower Body Bathing: with mod assist;sit to/from stand Pt Will Transfer to Toilet: with mod assist;bedside commode Additional ADL Goal #1: Pt will complete bed mobility Supervision level  Plan Discharge plan remains appropriate    Co-evaluation                 End of Session     Activity Tolerance Patient tolerated  treatment well   Patient Left in chair;with call bell/phone within reach   Nurse Communication Mobility status;Precautions        Time: 6962-9528 OT Time Calculation (min): 23 min  Charges: OT General Charges $OT Visit: 1 Procedure OT Treatments $Self Care/Home Management : 23-37 mins  Peri Maris 09/09/2013, 11:04 AM Pager: [phone]

## 2013-09-10 LAB — GLUCOSE, CAPILLARY
GLUCOSE-CAPILLARY: 117 mg/dL — AB (ref 70–99)
GLUCOSE-CAPILLARY: 116 mg/dL — AB (ref 70–99)
GLUCOSE-CAPILLARY: 120 mg/dL — AB (ref 70–99)
Glucose-Capillary: 128 mg/dL — ABNORMAL HIGH (ref 70–99)

## 2013-09-10 MED ORDER — CEFAZOLIN SODIUM 1-5 GM-% IV SOLN
1.0000 g | INTRAVENOUS | Status: AC
  Administered 2013-09-11: 1 g via INTRAVENOUS
  Filled 2013-09-10: qty 50

## 2013-09-10 NOTE — Care Management Note (Signed)
    Page 1 of 1   09/08/2013     3:52:09 PM CARE MANAGEMENT NOTE 09/08/2013  Patient:  Donald [name], Donald [name]   Account Number:  [id number]  Date Initiated:  09/08/2013  Documentation initiated by:  Luz Lex  Subjective/Objective Assessment:   ischemic leg - now post surgery.     Action/Plan:   Anticipated DC Date:  09/10/2013   Anticipated DC Plan:  IP REHAB FACILITY      DC Planning Services  CM consult      Choice offered to / List presented to:             Status of service:  In process, will continue to follow Medicare Important Message given?   (If response is "NO", the following Medicare IM given date fields will be blank) Date Medicare IM given:   Date Additional Medicare IM given:    Discharge Disposition:    Per UR Regulation:  Reviewed for med. necessity/level of care/duration of stay  If discussed at [place] of Stay Meetings, dates discussed:    Comments:  09-08-13  3:30 pm Luz Lex, RNBSN - [phone] Talked with wife and patient in room.  Lives at home with wife who is with him 24/7.  Have cane, shower chair, BSC - no further needs at this time for equipment.  Have used [facility] in past.  Hope to go to inhouse rehab prior to going home.  States does not want to go to SNF for ST rehab if doesn't get inpt, will go home with wife.  Wife in agreement. CM will continue to follow.

## 2013-09-10 NOTE — Progress Notes (Signed)
   VASCULAR PROGRESS NOTE  SUBJECTIVE: No complaints  PHYSICAL EXAM: Filed Vitals:   09/09/13 1002 09/09/13 1400 09/09/13 2005 09/10/13 0444  BP: 117/50 105/45 118/50 116/66  Pulse: 69 73 71 97  Temp:  97.7 F (36.5 C) 98.3 F (36.8 C) 99.3 F (37.4 C)  TempSrc:  Oral Oral Oral  Resp:  18 18 18   Height:      Weight:      SpO2:  99% 98% 99%   VAC's both with good seal. Left foot warm Toes unchanged. Minimal drainage from left groin  LABS: Lab Results  Component Value Date   WBC 8.6 09/06/2013   HGB 9.1* 09/06/2013   HCT 30.0* 09/06/2013   MCV 75.8* 09/06/2013   PLT 223 09/06/2013   Lab Results  Component Value Date   CREATININE 1.13 09/06/2013   Lab Results  Component Value Date   INR 1.14 09/05/2013   CBG (last 3)   Recent Labs  09/09/13 1122 09/09/13 1618 09/09/13 2115  GLUCAP 127* 97 105*    Active Problems:   Ischemic leg   ASSESSMENT AND PLAN:  * 5 Days Post-Op s/p thrombectomy of left femoropopliteal bypass graft, bovine pericardial patch of distal bypass graft, intraoperative arteriogram, 4 compartment fasciotomy.   * Will look at fasciotomy sites in AM. If looks like they might close, I will take him to OR later in the day.  * Continue Keflex.  * Lovenox for DVT prophylaxis.  Gae Gallop Beeper: [phone] 09/10/2013

## 2013-09-11 ENCOUNTER — Encounter (HOSPITAL_COMMUNITY)
Admission: EM | Disposition: A | Discharge status: Rehabilitation Facility | Payer: Self-pay | Source: Home / Self Care | Attending: Vascular Surgery

## 2013-09-11 ENCOUNTER — Encounter (HOSPITAL_COMMUNITY): Payer: Medicare Other | Admitting: Anesthesiology

## 2013-09-11 ENCOUNTER — Encounter (HOSPITAL_COMMUNITY): Payer: Self-pay | Admitting: Anesthesiology

## 2013-09-11 ENCOUNTER — Inpatient Hospital Stay (HOSPITAL_COMMUNITY): Payer: Medicare Other | Admitting: Anesthesiology

## 2013-09-11 DIAGNOSIS — M79A29 Nontraumatic compartment syndrome of unspecified lower extremity: Secondary | ICD-10-CM

## 2013-09-11 HISTORY — PX: FASCIOTOMY CLOSURE: SHX5829

## 2013-09-11 LAB — GLUCOSE, CAPILLARY
GLUCOSE-CAPILLARY: 102 mg/dL — AB (ref 70–99)
GLUCOSE-CAPILLARY: 93 mg/dL (ref 70–99)
GLUCOSE-CAPILLARY: 134 mg/dL — AB (ref 70–99)
Glucose-Capillary: 104 mg/dL — ABNORMAL HIGH (ref 70–99)
Glucose-Capillary: 106 mg/dL — ABNORMAL HIGH (ref 70–99)

## 2013-09-11 SURGERY — FASCIOTOMY CLOSURE
Anesthesia: General | Site: Leg Lower | Laterality: Left

## 2013-09-11 MED ORDER — GLYCOPYRROLATE 0.2 MG/ML IJ SOLN
INTRAMUSCULAR | Status: DC | PRN
  Administered 2013-09-11: 0.4 mg via INTRAVENOUS
  Administered 2013-09-11: .5 mg via INTRAVENOUS

## 2013-09-11 MED ORDER — ALBUMIN HUMAN 5 % IV SOLN
INTRAVENOUS | Status: DC | PRN
  Administered 2013-09-11: 14:00:00 via INTRAVENOUS

## 2013-09-11 MED ORDER — DEXTROSE 5 % IV SOLN
INTRAVENOUS | Status: DC | PRN
  Administered 2013-09-11: 13:00:00 via INTRAVENOUS

## 2013-09-11 MED ORDER — LACTATED RINGERS IV SOLN
INTRAVENOUS | Status: DC | PRN
  Administered 2013-09-11 (×2): via INTRAVENOUS

## 2013-09-11 MED ORDER — FENTANYL CITRATE 0.05 MG/ML IJ SOLN
INTRAMUSCULAR | Status: AC
  Filled 2013-09-11: qty 5

## 2013-09-11 MED ORDER — METOCLOPRAMIDE HCL 5 MG/ML IJ SOLN
INTRAMUSCULAR | Status: AC
  Filled 2013-09-11: qty 2

## 2013-09-11 MED ORDER — SODIUM CHLORIDE 0.9 % IV SOLN
10.0000 mg | INTRAVENOUS | Status: DC | PRN
  Administered 2013-09-11: 25 ug/min via INTRAVENOUS

## 2013-09-11 MED ORDER — CEFAZOLIN SODIUM 1-5 GM-% IV SOLN
1.0000 g | Freq: Three times a day (TID) | INTRAVENOUS | Status: DC
  Administered 2013-09-11 – 2013-09-14 (×9): 1 g via INTRAVENOUS
  Filled 2013-09-11 (×11): qty 50

## 2013-09-11 MED ORDER — HYDROMORPHONE HCL PF 1 MG/ML IJ SOLN: 0.2500 mg | INTRAMUSCULAR | Status: DC | PRN

## 2013-09-11 MED ORDER — 0.9 % SODIUM CHLORIDE (POUR BTL) OPTIME
TOPICAL | Status: DC | PRN
Start: 2013-09-11 — End: 2013-09-11
  Administered 2013-09-11: 1000 mL

## 2013-09-11 MED ORDER — PHENYLEPHRINE HCL 10 MG/ML IJ SOLN
INTRAMUSCULAR | Status: DC | PRN
  Administered 2013-09-11: 120 ug via INTRAVENOUS

## 2013-09-11 MED ORDER — METOCLOPRAMIDE HCL 5 MG/ML IJ SOLN
INTRAMUSCULAR | Status: DC | PRN
  Administered 2013-09-11: 10 mg via INTRAVENOUS

## 2013-09-11 MED ORDER — PROPOFOL 10 MG/ML IV BOLUS
INTRAVENOUS | Status: DC | PRN
  Administered 2013-09-11: 150 mg via INTRAVENOUS

## 2013-09-11 MED ORDER — PROPOFOL 10 MG/ML IV BOLUS
INTRAVENOUS | Status: AC
  Filled 2013-09-11: qty 20

## 2013-09-11 MED ORDER — FENTANYL CITRATE 0.05 MG/ML IJ SOLN
INTRAMUSCULAR | Status: DC | PRN
  Administered 2013-09-11 (×2): 50 ug via INTRAVENOUS
  Administered 2013-09-11: 150 ug via INTRAVENOUS

## 2013-09-11 MED ORDER — ROCURONIUM BROMIDE 100 MG/10ML IV SOLN
INTRAVENOUS | Status: DC | PRN
  Administered 2013-09-11: 35 mg via INTRAVENOUS

## 2013-09-11 MED ORDER — ARTIFICIAL TEARS OP OINT
TOPICAL_OINTMENT | OPHTHALMIC | Status: DC | PRN
  Administered 2013-09-11: 1 via OPHTHALMIC

## 2013-09-11 MED ORDER — MIDAZOLAM HCL 2 MG/2ML IJ SOLN
INTRAMUSCULAR | Status: AC
  Filled 2013-09-11: qty 2

## 2013-09-11 MED ORDER — NEOSTIGMINE METHYLSULFATE 10 MG/10ML IV SOLN
INTRAVENOUS | Status: DC | PRN
  Administered 2013-09-11: 4 mg via INTRAVENOUS

## 2013-09-11 MED ORDER — ONDANSETRON HCL 4 MG/2ML IJ SOLN
INTRAMUSCULAR | Status: DC | PRN
  Administered 2013-09-11: 4 mg via INTRAVENOUS

## 2013-09-11 MED ORDER — LIDOCAINE HCL (CARDIAC) 20 MG/ML IV SOLN
INTRAVENOUS | Status: DC | PRN
  Administered 2013-09-11: 60 mg via INTRAVENOUS

## 2013-09-11 MED ORDER — LACTATED RINGERS IV SOLN
INTRAVENOUS | Status: DC
  Administered 2013-09-11: 12:00:00 via INTRAVENOUS

## 2013-09-11 SURGICAL SUPPLY — 35 items
BANDAGE ELASTIC 4 VELCRO ST LF (GAUZE/BANDAGES/DRESSINGS) IMPLANT
BANDAGE ELASTIC 6 VELCRO ST LF (GAUZE/BANDAGES/DRESSINGS) ×2 IMPLANT
BANDAGE GAUZE ELAST BULKY 4 IN (GAUZE/BANDAGES/DRESSINGS) ×2 IMPLANT
BLADE 10 SAFETY STRL DISP (BLADE) ×3 IMPLANT
CANISTER SUCTION 2500CC (MISCELLANEOUS) ×3 IMPLANT
CANISTER WOUND CARE 500ML ATS (WOUND CARE) ×2 IMPLANT
COVER SURGICAL LIGHT HANDLE (MISCELLANEOUS) ×3 IMPLANT
DRSG MEPITEL 4X7.2 (GAUZE/BANDAGES/DRESSINGS) ×3 IMPLANT
DRSG VAC ATS LRG SENSATRAC (GAUZE/BANDAGES/DRESSINGS) ×2 IMPLANT
ELECT REM PT RETURN 9FT ADLT (ELECTROSURGICAL) ×3
ELECTRODE REM PT RTRN 9FT ADLT (ELECTROSURGICAL) ×1 IMPLANT
GLOVE BIO SURGEON STRL SZ7 (GLOVE) ×2 IMPLANT
GLOVE BIO SURGEON STRL SZ7.5 (GLOVE) ×5 IMPLANT
GLOVE BIOGEL PI IND STRL 8 (GLOVE) IMPLANT
GLOVE BIOGEL PI INDICATOR 8 (GLOVE) ×2
GLOVE SS N UNI LF 7.5 STRL (GLOVE) ×3 IMPLANT
GOWN BRE IMP SLV AUR LG STRL (GOWN DISPOSABLE) IMPLANT
GOWN STRL REUS W/ TWL LRG LVL3 (GOWN DISPOSABLE) ×2 IMPLANT
GOWN STRL REUS W/TWL LRG LVL3 (GOWN DISPOSABLE) ×6
KIT BASIN OR (CUSTOM PROCEDURE TRAY) ×3 IMPLANT
KIT ROOM TURNOVER OR (KITS) ×3 IMPLANT
NS IRRIG 1000ML POUR BTL (IV SOLUTION) ×3 IMPLANT
PACK GENERAL/GYN (CUSTOM PROCEDURE TRAY) ×3 IMPLANT
PACK UNIVERSAL I (CUSTOM PROCEDURE TRAY) ×3 IMPLANT
PAD ARMBOARD 7.5X6 YLW CONV (MISCELLANEOUS) ×3 IMPLANT
SPONGE GAUZE 4X4 12PLY (GAUZE/BANDAGES/DRESSINGS) ×3 IMPLANT
SPONGE GAUZE 4X4 12PLY STER LF (GAUZE/BANDAGES/DRESSINGS) ×3 IMPLANT
SUT ETHILON 3 0 PS 1 (SUTURE) ×12 IMPLANT
SUT VIC AB 2-0 CTX 36 (SUTURE) IMPLANT
SUT VIC AB 3-0 SH 27 (SUTURE)
SUT VIC AB 3-0 SH 27X BRD (SUTURE) IMPLANT
SUT VICRYL 4-0 PS2 18IN ABS (SUTURE) IMPLANT
TOWEL OR 17X24 6PK STRL BLUE (TOWEL DISPOSABLE) ×3 IMPLANT
TOWEL OR 17X26 10 PK STRL BLUE (TOWEL DISPOSABLE) ×6 IMPLANT
WATER STERILE IRR 1000ML POUR (IV SOLUTION) ×3 IMPLANT

## 2013-09-11 NOTE — Anesthesia Procedure Notes (Signed)
Procedure Name: Intubation Date/Time: 09/11/2013 1:31 PM Performed by: Jacquiline Doe A Pre-anesthesia Checklist: Patient identified, Emergency Drugs available, Suction available, Patient being monitored and Timeout performed Patient Re-evaluated:Patient Re-evaluated prior to inductionOxygen Delivery Method: Circle system utilized Preoxygenation: Pre-oxygenation with 100% oxygen Intubation Type: IV induction and Cricoid Pressure applied Ventilation: Mask ventilation without difficulty Laryngoscope Size: Mac and 3 Grade View: Grade I Tube type: Oral Number of attempts: 1 Airway Equipment and Method: Stylet Placement Confirmation: ETT inserted through vocal cords under direct vision,  positive ETCO2 and breath sounds checked- equal and bilateral Secured at: 23 cm Tube secured with: Tape Dental Injury: Teeth and Oropharynx as per pre-operative assessment

## 2013-09-11 NOTE — Transfer of Care (Signed)
Immediate Anesthesia Transfer of Care Note  Patient: Donald [name]  Procedure(s) Performed: Procedure(s): CLOSURE OF FASCIOTOMY SITES LEFT LOWER EXTREMITY AND WOUND VAC APPLICATION (Left)  Patient Location: PACU  Anesthesia Type:General  Level of Consciousness: oriented, sedated, patient cooperative and responds to stimulation  Airway & Oxygen Therapy: Patient Spontanous Breathing and Patient connected to nasal cannula oxygen  Post-op Assessment: Report given to PACU RN and Post -op Vital signs reviewed and stable  Post vital signs: Reviewed and stable  Complications: No apparent anesthesia complications

## 2013-09-11 NOTE — Consult Note (Signed)
[place] visit with patient, VVS has removed VAC today and plans to take him back to the OR today for closure of fasciotomy sites. Per patient may not be able to close both sites, will keep patient on [place] list and evaluate on Monday for need for VAC change if needed.   Shebra Muldrow [place] RN,CWOCN [phone]

## 2013-09-11 NOTE — Op Note (Signed)
    NAME: Donald [name]   MRN: [id number] DOB: 1945/10/04    DATE OF OPERATION: 09/11/2013  PREOP DIAGNOSIS: S/P 4 compartment fasciotomy  POSTOP DIAGNOSIS: same  PROCEDURE: Closure of medial fasciotomy site  SURGEON: Judeth Cornfield. Scot Dock, MD, FACS  ASSIST: none  ANESTHESIA: General   EBL: minimal  INDICATIONS: Donald [name] is a 68 y.o. male who had undergone a 4 compartment fasciotomy. He has had VAC sponges on both sides and presents today for closure of the medial fasciotomy.  FINDINGS: the medial fasciotomy site looks good and the muscle was viable. This was closed without difficulty. Muscle at the lateral fasciotomy did not contract well.  TECHNIQUE: The patient was taken to the operating room and received a general anesthetic. The left lower extremity was prepped and draped in usual sterile fashion. The medial wound was irrigated with saline and the muscle inspected. This appeared well-perfused and viable and contracted normally. This wound was closed with interrupted 3-0 nylon was. There was not significant tension on this wound. I then inspected the lateral wound and the muscle did not contract. There was too much tension on this wound to close. VAC was placed on the lateral wound. A sterile dressing was applied. Patient tolerated the procedure well and transferred to the recovery room in stable condition. All needle and sponge counts were correct.  Deitra Mayo, MD, FACS [facility]  DATE OF DICTATION:   09/11/2013

## 2013-09-11 NOTE — Anesthesia Postprocedure Evaluation (Signed)
  Anesthesia Post-op Note  Patient: Donald [name]  Procedure(s) Performed: Procedure(s): CLOSURE OF FASCIOTOMY SITES LEFT LOWER EXTREMITY AND WOUND VAC APPLICATION (Left)  Patient Location: PACU  Anesthesia Type:General  Level of Consciousness: awake  Airway and Oxygen Therapy: Patient Spontanous Breathing  Post-op Pain: mild  Post-op Assessment: Post-op Vital signs reviewed  Post-op Vital Signs: Reviewed  Last Vitals:  Filed Vitals:   09/11/13 1546  BP:   Pulse:   Temp: 36.7 C  Resp:     Complications: No apparent anesthesia complications

## 2013-09-11 NOTE — H&P (View-Only) (Signed)
   VASCULAR PROGRESS NOTE  SUBJECTIVE: No complaints  PHYSICAL EXAM: Filed Vitals:   09/09/13 1002 09/09/13 1400 09/09/13 2005 09/10/13 0444  BP: 117/50 105/45 118/50 116/66  Pulse: 69 73 71 97  Temp:  97.7 F (36.5 C) 98.3 F (36.8 C) 99.3 F (37.4 C)  TempSrc:  Oral Oral Oral  Resp:  18 18 18  Height:      Weight:      SpO2:  99% 98% 99%   VAC's both with good seal. Left foot warm Toes unchanged. Minimal drainage from left groin  LABS: Lab Results  Component Value Date   WBC 8.6 09/06/2013   HGB 9.1* 09/06/2013   HCT 30.0* 09/06/2013   MCV 75.8* 09/06/2013   PLT 223 09/06/2013   Lab Results  Component Value Date   CREATININE 1.13 09/06/2013   Lab Results  Component Value Date   INR 1.14 09/05/2013   CBG (last 3)   Recent Labs  09/09/13 1122 09/09/13 1618 09/09/13 2115  GLUCAP 127* 97 105*    Active Problems:   Ischemic leg   ASSESSMENT AND PLAN:  * 5 Days Post-Op s/p thrombectomy of left femoropopliteal bypass graft, bovine pericardial patch of distal bypass graft, intraoperative arteriogram, 4 compartment fasciotomy.   * Will look at fasciotomy sites in AM. If looks like they might close, I will take him to OR later in the day.  * Continue Keflex.  * Lovenox for DVT prophylaxis.  Chris Adrienna Karis Beeper: [phone] 09/10/2013    

## 2013-09-11 NOTE — Anesthesia Preprocedure Evaluation (Signed)
Anesthesia Evaluation  Patient identified by MRN, date of birth, ID band Patient awake    Reviewed: Allergy & Precautions, H&P , NPO status , Patient's Chart, lab work & pertinent test results  Airway Mallampati: II      Dental   Pulmonary pneumonia -, former smoker,  breath sounds clear to auscultation        Cardiovascular hypertension, + CAD and + Peripheral Vascular Disease Rhythm:Regular Rate:Normal     Neuro/Psych    GI/Hepatic GERD-  ,  Endo/Other  diabetes  Renal/GU      Musculoskeletal   Abdominal   Peds  Hematology   Anesthesia Other Findings   Reproductive/Obstetrics                           Anesthesia Physical Anesthesia Plan  ASA: III  Anesthesia Plan: General   Post-op Pain Management:    Induction: Intravenous  Airway Management Planned: Oral ETT  Additional Equipment: CVP  Intra-op Plan:   Post-operative Plan: Extubation in OR  Informed Consent: I have reviewed the patients History and Physical, chart, labs and discussed the procedure including the risks, benefits and alternatives for the proposed anesthesia with the patient or authorized representative who has indicated his/her understanding and acceptance.   Dental advisory given  Plan Discussed with: Anesthesiologist and CRNA  Anesthesia Plan Comments:         Anesthesia Quick Evaluation

## 2013-09-11 NOTE — Interval H&P Note (Signed)
History and Physical Interval Note:  09/11/2013 12:57 PM  Donald [name]  has presented today for surgery, with the diagnosis of hx of Ischemic Left Lower Extremity hx of Fasciotomy Left Lower Extremity  The various methods of treatment have been discussed with the patient and family. After consideration of risks, benefits and other options for treatment, the patient has consented to  Procedure(s): CLOSURE OF FASCIOTOMY SITES LEFT LOWER EXTREMITY (Left) as a surgical intervention .  The patient's history has been reviewed, patient examined, no change in status, stable for surgery.  I have reviewed the patient's chart and labs.  Questions were answered to the patient's satisfaction.     Angelia Mould

## 2013-09-11 NOTE — Progress Notes (Addendum)
Physical Therapy Treatment Patient Details Name: Donald [name] MRN: [id number] DOB: May 17, 1945 Today's Date: 09/11/2013    History of Present Illness Patient is a 68 yo male with complicated vascular history s/p trombectomy of Left Femoral-Below Knee Popliteal Bypass Graft; Patch Angioplasty of Distal Portion of Bypass Graft (Left) Patch Angioplasty Distal Portion of Left Femoral-Below Knee Popliteal Bypass Graft (Left) and fasciotomy (Left).  Going for closure of fasciotomy sites 09/11/13.      PT Comments    Pt admitted with above. Pt currently with functional limitations due to balance and endurance deficits listed below (see PT Problem List). Pt also with significant pain in left LE limiting progression of mobility.  Pt will benefit from skilled PT to increase their independence and safety with mobility to allow discharge to the venue listed below.    Follow Up Recommendations  CIR;Supervision/Assistance - 24 hour     Equipment Recommendations  Other (comment) (TBA)    Recommendations for Other Services Rehab consult     Precautions / Restrictions Precautions Precautions: Fall Precaution Comments: wound vac removed  left lower leg as pt going for surgery later today. Restrictions Weight Bearing Restrictions: No    Mobility  Bed Mobility Overal bed mobility: Needs Assistance Bed Mobility: Supine to Sit     Supine to sit: Min assist     General bed mobility comments: Uses momentum to get to EOB.  Struggling a little.  Transfers Overall transfer level: Needs assistance Equipment used: Straight cane Transfers: Sit to/from [facility] Sit to Stand: Mod assist Stand pivot transfers: Min assist       General transfer comment: increased assist for stability once standing due to right LOB.  Able to take two steps to pivot to chair with c/o severe right LE pain with weight bearing.Refused to do anything further.  Ambulation/Gait                  Stairs            Wheelchair Mobility    Modified Rankin (Stroke Patients Only)       Balance Overall balance assessment: Needs assistance;History of Falls Sitting-balance support: No upper extremity supported;Feet supported Sitting balance-Leahy Scale: Fair     Standing balance support: Single extremity supported;During functional activity Standing balance-Leahy Scale: Poor Standing balance comment: limited tolerance to weight on left LE, loss of balance to right when standing.                      Cognition Arousal/Alertness: Awake/alert Behavior During Therapy: WFL for tasks assessed/performed Overall Cognitive Status: Within Functional Limits for tasks assessed                      Exercises General Exercises - Lower Extremity Ankle Circles/Pumps: AROM;Both;10 reps;Supine Long Arc Quad: AROM;Left;10 reps;Seated    General Comments General comments (skin integrity, edema, etc.): wound vac removed for surgery. Incr pain with LE in dependent position.      Pertinent Vitals/Pain VSS, 10/10 left LE pain.     Home Living                      Prior Function            PT Goals (current goals can now be found in the care plan section) Progress towards PT goals: Progressing toward goals    Frequency  Min 3X/week    PT Plan Current plan remains appropriate  Co-evaluation             End of Session Equipment Utilized During Treatment: Gait belt Activity Tolerance: Patient limited by pain Patient left: in chair;with call bell/phone within reach;with family/visitor present     Time: 6834-1962 PT Time Calculation (min): 12 min  Charges:  $Therapeutic Activity: 8-22 mins                    G Codes:      Christianne Dolin 2013/09/18, 11:24 AM  [facility] [phone] (pager)

## 2013-09-11 NOTE — Progress Notes (Signed)
Rehab admissions - I am following pt's case and made note of pt's planned surgery today for closure of fasciotomy sites. Also noted that his need for further wound VAC therapy will be evaluated on Monday. I spoke with pt and he had no further questions for me.  I will check on pt on Monday, following his surgery and consider admitting to inpatient rehab pending his medical stability/no further surgical issues and pending our bed availability.  Please call me with any questions. Thanks.  Nanetta Batty, PT Rehabilitation Admissions Coordinator [phone]

## 2013-09-11 NOTE — Progress Notes (Signed)
VASCULAR SURGERY:  At surgery today, it was noted that the lateral and anterior compartments did not appear viable although there was no evidence of significant infection. He has mild erythema on the anterior aspect of his leg. If the erythema progresses or the muscle becomes infected he will clearly require an above-knee amputation. I have discussed this with the patient's wife. He had a prolonged period of ischemia. He had developed a cold left leg at 4 AM but had not presented to the emergency department until later in the afternoon that day. We'll need to follow her exam closely. His  VAC change will be on Monday Wednesday and Friday and I will try to inspect the wound that time.  Deitra Mayo, MD, [place] [phone] 09/11/2013

## 2013-09-12 LAB — GLUCOSE, CAPILLARY
GLUCOSE-CAPILLARY: 121 mg/dL — AB (ref 70–99)
Glucose-Capillary: 149 mg/dL — ABNORMAL HIGH (ref 70–99)
Glucose-Capillary: 115 mg/dL — ABNORMAL HIGH (ref 70–99)
Glucose-Capillary: 132 mg/dL — ABNORMAL HIGH (ref 70–99)

## 2013-09-12 LAB — CREATININE, SERUM
Creatinine, Ser: 1.07 mg/dL (ref 0.50–1.35)
GFR calc non Af Amer: 69 mL/min — ABNORMAL LOW (ref >90)
GFR, EST AFRICAN AMERICAN: 80 mL/min — AB (ref >90)

## 2013-09-12 NOTE — Progress Notes (Addendum)
[facility] Progress Note  09/12/2013 10:08 AM 1 Day Post-Op  Subjective:  States he did not have a good night  Afebrile VSS 90% RA  Filed Vitals:   09/12/13 0559  BP: 135/63  Pulse: 74  Temp: 97.8 F (36.6 C)  Resp: 17    Physical Exam: Incisions:  Medial incision is c/d/i with staples in tact.  The vac has a good seal. Extremities:  Left foot is warm, Pulses are not palpable.  CBC    Component Value Date/Time   WBC 8.6 09/06/2013 0005   WBC 7.1 06/05/2013 1010   RBC 3.96* 09/06/2013 0005   RBC 4.23 06/05/2013 1010   RBC 3.54* 01/26/2012 0455   HGB 9.1* 09/06/2013 0005   HGB 9.3* 06/05/2013 1010   HCT 30.0* 09/06/2013 0005   HCT 30.8* 06/05/2013 1010   PLT 223 09/06/2013 0005   PLT 324 06/05/2013 1010   MCV 75.8* 09/06/2013 0005   MCV 72.9* 06/05/2013 1010   MCH 23.0* 09/06/2013 0005   MCH 22.0* 06/05/2013 1010   MCHC 30.3 09/06/2013 0005   MCHC 30.2* 06/05/2013 1010   RDW 18.8* 09/06/2013 0005   RDW 16.9* 06/05/2013 1010   LYMPHSABS 1.8 09/04/2013 2123   LYMPHSABS 1.6 06/05/2013 1010   MONOABS 0.6 09/04/2013 2123   MONOABS 0.5 06/05/2013 1010   EOSABS 0.3 09/04/2013 2123   EOSABS 0.1 06/05/2013 1010   BASOSABS 0.1 09/04/2013 2123   BASOSABS 0.1 06/05/2013 1010    BMET    Component Value Date/Time   NA 139 09/06/2013 0845   NA 137 06/05/2013 1010   NA 147* 10/12/2011 0959   K 4.8 09/06/2013 0845   K 5.4* 06/05/2013 1010   K 5.2* 10/12/2011 0959   CL 105 09/06/2013 0845   CL 102 05/19/2012 1427   CL 102 10/12/2011 0959   CO2 21 09/06/2013 0845   CO2 23 06/05/2013 1010   CO2 29 10/12/2011 0959   GLUCOSE 180* 09/06/2013 0845   GLUCOSE 239* 06/05/2013 1010   GLUCOSE 205* 05/19/2012 1427   GLUCOSE 244* 10/12/2011 0959   BUN 17 09/06/2013 0845   BUN 19.5 06/05/2013 1010   BUN 13 10/12/2011 0959   CREATININE 1.07 09/12/2013 0459   CREATININE 1.4* 06/05/2013 1010   CREATININE 1.0 10/12/2011 0959   CALCIUM 7.9* 09/06/2013 0845   CALCIUM 10.1 06/05/2013 1010   CALCIUM 9.0 10/12/2011 0959   GFRNONAA 69* 09/12/2013 0459   GFRAA 80* 09/12/2013 0459    INR    Component Value Date/Time   INR 1.14 09/05/2013 1805     Intake/Output Summary (Last 24 hours) at 09/12/13 1008 Last data filed at 09/12/13 1000  Gross per 24 hour  Intake   2040 ml  Output   1160 ml  Net    880 ml     Assessment:  68 y.o. male is s/p:  Closure of medial fasciotomy site  1 Day Post-Op And  1. Thrombectomy of left femoropopliteal bypass graft  2. Bovine pericardial patch of distal bypass graft  3. Intraoperative arteriogram  4. 4 compartment fasciotomy 7 Days Post-Op  Plan: -pt doing well this am. -at surgery yesterday, it was noted the lateral and anterior compartments did not appear viable, but no evidence of infection.  He had developed ischemic leg at 4am, but presented to hospital much later that afternoon.  If his leg becomes infected, he will need an AKA. -vac with good seal this morning and medial incision is c/d/i -continue  vac changes M/W/F to lateral fasciotomy wound -DVT prophylaxis:  Lovenox -needs to be OOB to chair tid -he is a candidate for CIR when ready for discharge   Leontine Locket, PA-C [facility] [phone] 09/12/2013 10:08 AM    Agree with above assessment VAC dressing in place-anterior compartment

## 2013-09-12 NOTE — Plan of Care (Signed)
Problem: Discharge Progression Outcomes Goal: Activity appropriate for discharge plan Outcome: Completed/Met Date Met:  09/12/13 OOB chair

## 2013-09-12 NOTE — Progress Notes (Signed)
Removed L IJ per MD order per hospital policy. Patient tolerated well. Patient reminded to remain in bed for 1 hour. Will continue to monitor closely.  [place] Nurse, RN

## 2013-09-13 LAB — GLUCOSE, CAPILLARY
GLUCOSE-CAPILLARY: 128 mg/dL — AB (ref 70–99)
Glucose-Capillary: 170 mg/dL — ABNORMAL HIGH (ref 70–99)
Glucose-Capillary: 105 mg/dL — ABNORMAL HIGH (ref 70–99)
Glucose-Capillary: 115 mg/dL — ABNORMAL HIGH (ref 70–99)

## 2013-09-13 NOTE — Progress Notes (Addendum)
[facility] Progress Note  09/13/2013 8:40 AM 2 Days Post-Op  Subjective:  C/o constipation.  Afebrile VSS 96% RA  Filed Vitals:   09/13/13 0533  BP: 126/37  Pulse: 81  Temp: 98.1 F (36.7 C)  Resp: 17    Physical Exam: Incisions:  Wound vac with good seal Extremities:  Left foot warm  CBC    Component Value Date/Time   WBC 8.6 09/06/2013 0005   WBC 7.1 06/05/2013 1010   RBC 3.96* 09/06/2013 0005   RBC 4.23 06/05/2013 1010   RBC 3.54* 01/26/2012 0455   HGB 9.1* 09/06/2013 0005   HGB 9.3* 06/05/2013 1010   HCT 30.0* 09/06/2013 0005   HCT 30.8* 06/05/2013 1010   PLT 223 09/06/2013 0005   PLT 324 06/05/2013 1010   MCV 75.8* 09/06/2013 0005   MCV 72.9* 06/05/2013 1010   MCH 23.0* 09/06/2013 0005   MCH 22.0* 06/05/2013 1010   MCHC 30.3 09/06/2013 0005   MCHC 30.2* 06/05/2013 1010   RDW 18.8* 09/06/2013 0005   RDW 16.9* 06/05/2013 1010   LYMPHSABS 1.8 09/04/2013 2123   LYMPHSABS 1.6 06/05/2013 1010   MONOABS 0.6 09/04/2013 2123   MONOABS 0.5 06/05/2013 1010   EOSABS 0.3 09/04/2013 2123   EOSABS 0.1 06/05/2013 1010   BASOSABS 0.1 09/04/2013 2123   BASOSABS 0.1 06/05/2013 1010    BMET    Component Value Date/Time   NA 139 09/06/2013 0845   NA 137 06/05/2013 1010   NA 147* 10/12/2011 0959   K 4.8 09/06/2013 0845   K 5.4* 06/05/2013 1010   K 5.2* 10/12/2011 0959   CL 105 09/06/2013 0845   CL 102 05/19/2012 1427   CL 102 10/12/2011 0959   CO2 21 09/06/2013 0845   CO2 23 06/05/2013 1010   CO2 29 10/12/2011 0959   GLUCOSE 180* 09/06/2013 0845   GLUCOSE 239* 06/05/2013 1010   GLUCOSE 205* 05/19/2012 1427   GLUCOSE 244* 10/12/2011 0959   BUN 17 09/06/2013 0845   BUN 19.5 06/05/2013 1010   BUN 13 10/12/2011 0959   CREATININE 1.07 09/12/2013 0459   CREATININE 1.4* 06/05/2013 1010   CREATININE 1.0 10/12/2011 0959   CALCIUM 7.9* 09/06/2013 0845   CALCIUM 10.1 06/05/2013 1010   CALCIUM 9.0 10/12/2011 0959   GFRNONAA 69* 09/12/2013 0459   GFRAA 80* 09/12/2013 0459    INR    Component Value Date/Time   INR 1.14 09/05/2013 1805     Intake/Output Summary (Last 24 hours) at 09/13/13 0840 Last data filed at 09/13/13 0646  Gross per 24 hour  Intake    120 ml  Output   1350 ml  Net  -1230 ml     Assessment:  68 y.o. male is s/p:  Closure of medial fasciotomy site  2 Day Post-Op  And  1. Thrombectomy of left femoropopliteal bypass graft  2. Bovine pericardial patch of distal bypass graft  3. Intraoperative arteriogram  4. 4 compartment fasciotomy  8 Days Post-Op   Plan: -vac with good seal-for vac change tomorrow.  Dr. Scot Dock to assess wound. -left IJ removed yesterday and may access porta cath  -DVT prophylaxis:  Lovenox -constipation-takes colace 200 mg po bid at home-this is ordered PRN and today is the first time he has received the 200 mg.  Have communicated with RN to continue with the colace PRN.   Donald Locket, PA-C [facility] [phone] 09/13/2013 8:40 AM  Agree with above assessment Vac dressing change in  place  Dr. Scot Dock to assess in am

## 2013-09-14 ENCOUNTER — Encounter (HOSPITAL_COMMUNITY): Payer: Self-pay | Admitting: *Deleted

## 2013-09-14 ENCOUNTER — Inpatient Hospital Stay (HOSPITAL_COMMUNITY)
Admission: RE | Admit: 2013-09-14 | Discharge: 2013-09-24 | DRG: 945 | Disposition: A | Discharge status: Home Health | Payer: Medicare Other | Source: Intra-hospital | Attending: Physical Medicine & Rehabilitation | Admitting: Physical Medicine & Rehabilitation

## 2013-09-14 DIAGNOSIS — I739 Peripheral vascular disease, unspecified: Secondary | ICD-10-CM | POA: Diagnosis present

## 2013-09-14 DIAGNOSIS — Z87891 Personal history of nicotine dependence: Secondary | ICD-10-CM

## 2013-09-14 DIAGNOSIS — Z5189 Encounter for other specified aftercare: Principal | ICD-10-CM

## 2013-09-14 DIAGNOSIS — I10 Essential (primary) hypertension: Secondary | ICD-10-CM | POA: Diagnosis present

## 2013-09-14 DIAGNOSIS — I999 Unspecified disorder of circulatory system: Secondary | ICD-10-CM

## 2013-09-14 DIAGNOSIS — I69959 Hemiplegia and hemiparesis following unspecified cerebrovascular disease affecting unspecified side: Secondary | ICD-10-CM

## 2013-09-14 DIAGNOSIS — R5381 Other malaise: Secondary | ICD-10-CM

## 2013-09-14 DIAGNOSIS — K5900 Constipation, unspecified: Secondary | ICD-10-CM | POA: Diagnosis present

## 2013-09-14 DIAGNOSIS — D649 Anemia, unspecified: Secondary | ICD-10-CM | POA: Diagnosis present

## 2013-09-14 DIAGNOSIS — I69354 Hemiplegia and hemiparesis following cerebral infarction affecting left non-dominant side: Secondary | ICD-10-CM

## 2013-09-14 DIAGNOSIS — Z48812 Encounter for surgical aftercare following surgery on the circulatory system: Secondary | ICD-10-CM

## 2013-09-14 DIAGNOSIS — E119 Type 2 diabetes mellitus without complications: Secondary | ICD-10-CM | POA: Diagnosis present

## 2013-09-14 DIAGNOSIS — I2510 Atherosclerotic heart disease of native coronary artery without angina pectoris: Secondary | ICD-10-CM | POA: Diagnosis present

## 2013-09-14 DIAGNOSIS — E785 Hyperlipidemia, unspecified: Secondary | ICD-10-CM | POA: Diagnosis present

## 2013-09-14 DIAGNOSIS — C787 Secondary malignant neoplasm of liver and intrahepatic bile duct: Secondary | ICD-10-CM | POA: Diagnosis present

## 2013-09-14 DIAGNOSIS — I998 Other disorder of circulatory system: Secondary | ICD-10-CM | POA: Diagnosis present

## 2013-09-14 DIAGNOSIS — Z85048 Personal history of other malignant neoplasm of rectum, rectosigmoid junction, and anus: Secondary | ICD-10-CM

## 2013-09-14 DIAGNOSIS — K219 Gastro-esophageal reflux disease without esophagitis: Secondary | ICD-10-CM | POA: Diagnosis present

## 2013-09-14 LAB — GLUCOSE, CAPILLARY
GLUCOSE-CAPILLARY: 120 mg/dL — AB (ref 70–99)
Glucose-Capillary: 145 mg/dL — ABNORMAL HIGH (ref 70–99)
Glucose-Capillary: 144 mg/dL — ABNORMAL HIGH (ref 70–99)
Glucose-Capillary: 140 mg/dL — ABNORMAL HIGH (ref 70–99)

## 2013-09-14 MED ORDER — GABAPENTIN 300 MG PO CAPS
300.0000 mg | ORAL_CAPSULE | Freq: Three times a day (TID) | ORAL | Status: DC
  Administered 2013-09-14 – 2013-09-24 (×29): 300 mg via ORAL
  Filled 2013-09-14 (×32): qty 1

## 2013-09-14 MED ORDER — FLEET ENEMA 7-19 GM/118ML RE ENEM: 1.0000 | ENEMA | Freq: Once | RECTAL | Status: AC | PRN

## 2013-09-14 MED ORDER — OXYCODONE HCL 5 MG PO TABS
5.0000 mg | ORAL_TABLET | ORAL | Status: DC | PRN
  Administered 2013-09-14 – 2013-09-24 (×33): 10 mg via ORAL
  Filled 2013-09-14 (×38): qty 2

## 2013-09-14 MED ORDER — PHENOL 1.4 % MT LIQD: 1.0000 | OROMUCOSAL | Status: DC | PRN

## 2013-09-14 MED ORDER — PROCHLORPERAZINE MALEATE 5 MG PO TABS
5.0000 mg | ORAL_TABLET | Freq: Four times a day (QID) | ORAL | Status: DC | PRN
  Filled 2013-09-14: qty 2

## 2013-09-14 MED ORDER — ALUM & MAG HYDROXIDE-SIMETH 200-200-20 MG/5ML PO SUSP: 30.0000 mL | ORAL | Status: DC | PRN

## 2013-09-14 MED ORDER — ASPIRIN-DIPYRIDAMOLE ER 25-200 MG PO CP12
1.0000 | ORAL_CAPSULE | Freq: Two times a day (BID) | ORAL | Status: DC
  Administered 2013-09-14 – 2013-09-24 (×20): 1 via ORAL
  Filled 2013-09-14 (×22): qty 1

## 2013-09-14 MED ORDER — SENNOSIDES-DOCUSATE SODIUM 8.6-50 MG PO TABS
2.0000 | ORAL_TABLET | Freq: Every day | ORAL | Status: DC
  Administered 2013-09-14 – 2013-09-21 (×7): 2 via ORAL
  Filled 2013-09-14 (×5): qty 2
  Filled 2013-09-14: qty 1
  Filled 2013-09-14 (×5): qty 2

## 2013-09-14 MED ORDER — LISINOPRIL 2.5 MG PO TABS
2.5000 mg | ORAL_TABLET | Freq: Every day | ORAL | Status: DC
  Administered 2013-09-15 – 2013-09-24 (×9): 2.5 mg via ORAL
  Filled 2013-09-14 (×12): qty 1

## 2013-09-14 MED ORDER — DIPHENHYDRAMINE HCL 12.5 MG/5ML PO ELIX: 12.5000 mg | ORAL_SOLUTION | Freq: Four times a day (QID) | ORAL | Status: DC | PRN

## 2013-09-14 MED ORDER — INSULIN DETEMIR 100 UNIT/ML ~~LOC~~ SOLN: 10.0000 [IU] | Freq: Every day | SUBCUTANEOUS | Status: DC

## 2013-09-14 MED ORDER — CEPHALEXIN 500 MG PO CAPS: 500.0000 mg | ORAL_CAPSULE | Freq: Four times a day (QID) | ORAL | Status: DC

## 2013-09-14 MED ORDER — INSULIN DETEMIR 100 UNIT/ML ~~LOC~~ SOLN
10.0000 [IU] | Freq: Every day | SUBCUTANEOUS | Status: DC
  Administered 2013-09-15 – 2013-09-23 (×9): 10 [IU] via SUBCUTANEOUS
  Filled 2013-09-14 (×10): qty 0.1

## 2013-09-14 MED ORDER — GUAIFENESIN-DM 100-10 MG/5ML PO SYRP: 5.0000 mL | ORAL_SOLUTION | Freq: Four times a day (QID) | ORAL | Status: DC | PRN

## 2013-09-14 MED ORDER — CEFAZOLIN SODIUM 1-5 GM-% IV SOLN: 1.0000 g | Freq: Three times a day (TID) | INTRAVENOUS | Status: DC

## 2013-09-14 MED ORDER — TRAZODONE HCL 50 MG PO TABS
25.0000 mg | ORAL_TABLET | Freq: Every evening | ORAL | Status: DC | PRN
  Administered 2013-09-18 – 2013-09-24 (×4): 50 mg via ORAL
  Filled 2013-09-14 (×4): qty 1

## 2013-09-14 MED ORDER — BISACODYL 10 MG RE SUPP: 10.0000 mg | Freq: Every day | RECTAL | Status: DC | PRN

## 2013-09-14 MED ORDER — GUAIFENESIN-DM 100-10 MG/5ML PO SYRP: 15.0000 mL | ORAL_SOLUTION | ORAL | Status: DC | PRN

## 2013-09-14 MED ORDER — PROCHLORPERAZINE EDISYLATE 5 MG/ML IJ SOLN
5.0000 mg | Freq: Four times a day (QID) | INTRAMUSCULAR | Status: DC | PRN
  Filled 2013-09-14: qty 2

## 2013-09-14 MED ORDER — OXYCODONE HCL ER 10 MG PO T12A
10.0000 mg | EXTENDED_RELEASE_TABLET | Freq: Two times a day (BID) | ORAL | Status: DC
  Administered 2013-09-14 – 2013-09-17 (×6): 10 mg via ORAL
  Filled 2013-09-14 (×6): qty 1

## 2013-09-14 MED ORDER — METFORMIN HCL 500 MG PO TABS
500.0000 mg | ORAL_TABLET | Freq: Two times a day (BID) | ORAL | Status: DC
  Administered 2013-09-15 – 2013-09-24 (×19): 500 mg via ORAL
  Filled 2013-09-14 (×21): qty 1

## 2013-09-14 MED ORDER — BISACODYL 10 MG RE SUPP
10.0000 mg | Freq: Every day | RECTAL | Status: DC | PRN
  Administered 2013-09-16: 10 mg via RECTAL
  Filled 2013-09-14: qty 1

## 2013-09-14 MED ORDER — INSULIN ASPART 100 UNIT/ML ~~LOC~~ SOLN
0.0000 [IU] | Freq: Three times a day (TID) | SUBCUTANEOUS | Status: DC
  Administered 2013-09-15 – 2013-09-18 (×6): 2 [IU] via SUBCUTANEOUS
  Administered 2013-09-19: 3 [IU] via SUBCUTANEOUS
  Administered 2013-09-20 – 2013-09-21 (×2): 2 [IU] via SUBCUTANEOUS

## 2013-09-14 MED ORDER — OMEGA-3-ACID ETHYL ESTERS 1 G PO CAPS
2.0000 g | ORAL_CAPSULE | Freq: Two times a day (BID) | ORAL | Status: DC
  Administered 2013-09-14 – 2013-09-24 (×20): 2 g via ORAL
  Filled 2013-09-14 (×23): qty 2

## 2013-09-14 MED ORDER — CEFAZOLIN SODIUM 1-5 GM-% IV SOLN
1.0000 g | Freq: Three times a day (TID) | INTRAVENOUS | Status: DC
  Administered 2013-09-14 – 2013-09-24 (×29): 1 g via INTRAVENOUS
  Filled 2013-09-14 (×33): qty 50

## 2013-09-14 MED ORDER — ENOXAPARIN SODIUM 40 MG/0.4ML ~~LOC~~ SOLN
40.0000 mg | SUBCUTANEOUS | Status: DC
  Administered 2013-09-15 – 2013-09-24 (×10): 40 mg via SUBCUTANEOUS
  Filled 2013-09-14 (×10): qty 0.4

## 2013-09-14 MED ORDER — ACETAMINOPHEN 325 MG PO TABS
325.0000 mg | ORAL_TABLET | ORAL | Status: DC | PRN
  Administered 2013-09-23: 650 mg via ORAL
  Filled 2013-09-14 (×2): qty 2

## 2013-09-14 MED ORDER — MESALAMINE 1000 MG RE SUPP
1000.0000 mg | Freq: Every day | RECTAL | Status: DC
  Administered 2013-09-14 – 2013-09-23 (×10): 1000 mg via RECTAL
  Filled 2013-09-14 (×12): qty 1

## 2013-09-14 MED ORDER — PROCHLORPERAZINE 25 MG RE SUPP
12.5000 mg | Freq: Four times a day (QID) | RECTAL | Status: DC | PRN
  Filled 2013-09-14: qty 1

## 2013-09-14 MED ORDER — TRAMADOL HCL 50 MG PO TABS
50.0000 mg | ORAL_TABLET | Freq: Four times a day (QID) | ORAL | Status: DC | PRN
  Administered 2013-09-16 – 2013-09-24 (×11): 50 mg via ORAL
  Filled 2013-09-14 (×12): qty 1

## 2013-09-14 MED ORDER — INSULIN ASPART 100 UNIT/ML ~~LOC~~ SOLN: 0.0000 [IU] | Freq: Every day | SUBCUTANEOUS | Status: DC

## 2013-09-14 MED ORDER — PANTOPRAZOLE SODIUM 40 MG PO TBEC
40.0000 mg | DELAYED_RELEASE_TABLET | Freq: Every day | ORAL | Status: DC
  Administered 2013-09-15 – 2013-09-24 (×10): 40 mg via ORAL
  Filled 2013-09-14 (×12): qty 1

## 2013-09-14 MED ORDER — ALUM & MAG HYDROXIDE-SIMETH 200-200-20 MG/5ML PO SUSP: 15.0000 mL | ORAL | Status: DC | PRN

## 2013-09-14 NOTE — H&P (Signed)
Physical Medicine and Rehabilitation Admission H&P  Chief Complaint   Patient presents with   .  PVD with ischemic LLE s/p thrombectomy with fasciotomies. H/o CVA with left hemiparesis.   HPI: Donald [name] is a 68 y.o. right-handed male with history of CVA with left-sided weakness, HTN, DM type 2, PVD with multiple revascularization procedures (CIR stay 2014) and chronic infection left groin. He was admitted 09/05/2013 with ischemic changes LLE as well as recent fall. X rays of LLE without fracture and patient with significant anemia with Hgb 7.5 but refused transfusion. He was taken to OR for thrombectomy Left FPBG with bovine patch of distal bypass graft as well as 4 compartment fasciotomy by Dr. Scot Dock. VAC placed on two sites to help promote wound healing. Was taken to OR on 09/11/13 for closure of medial fasciotomy site and it was noted that lateral and anterior compartments did not appear viable---may need AKA if infection develops. Today has developed ischemic changes left 1st and 2nd toes with demarcating of 3rd, 4th and 5th toes. Therapy ongoing and patient limited by balance, pain as well as endurance deficits. CIR recommended by rehab team and patient admitted today.   Review of Systems  HENT: Negative for hearing loss.  Eyes: Negative for blurred vision and double vision.  Respiratory: Negative for cough and shortness of breath.  Cardiovascular: Positive for leg swelling (LLE). Negative for chest pain and orthopnea.  Gastrointestinal: Positive for constipation. Negative for heartburn and nausea.  Genitourinary: Negative for urgency and frequency.  Musculoskeletal: Positive for joint pain and myalgias.  Neurological: Positive for sensory change and focal weakness (spastic left hemiparesis). Negative for headaches.   Past Medical History   Diagnosis  Date   .  GERD (gastroesophageal reflux disease)    .  Hyperlipidemia    .  Hypertension    .  Peripheral vascular disease    .   Internal and external bleeding hemorrhoids    .  History of gout      left great toe   .  Depression    .  Numbness in right leg      Hx: of diabetes   .  Coronary artery disease      RCA occlusion with good collaterals, o/w no obs CAD 12/2011   .  Pneumonia  ~ 1977   .  Type II diabetes mellitus    .  History of blood transfusion  12/2011; 12/2012     "before OR; after OR" (01/22/2013)   .  Stroke  2008     "left side weak; unable to move left hand still" (01/22/2013)   .  Rectal cancer  dx'd 08/2007     S/P chemo, radiation, biopsies   .  Metastases to the liver  dx'd 08/2010    Past Surgical History   Procedure  Laterality  Date   .  Carotid endarterectomy  Right  2008   .  Liver cryoablation   08/2010     "chemo shrunk the cancer 50% then they went in and burned the rest of it"   .  Axillary-femoral bypass graft   01/29/2012     Procedure: BYPASS GRAFT AXILLA-BIFEMORAL; Surgeon: Elam Dutch, MD; Location: [facility] OR; Service: Vascular; Laterality: N/A; Left axilla-bifemoral bypass using gore-tex graft.   .  Colonoscopy w/ biopsies and polypectomy       Hx: of   .  Femoral-popliteal bypass graft  Left  01/06/2013     Procedure: Femoral-Popliteal  Artery Bypass; Surgeon: Elam Dutch, MD; Location: [place]; Service: Vascular; Laterality: Left;   .  Intraoperative arteriogram  Left  01/06/2013     Procedure: INTRA OPERATIVE ARTERIOGRAM; Surgeon: Elam Dutch, MD; Location: [place]; Service: Vascular; Laterality: Left;   .  Femoral-popliteal bypass graft  Left  01/10/2013     Procedure: THROMBECTOMY POSSIBLE BYPASS GRAFT FEMORAL-POPLITEAL ARTERY; Surgeon: Mal Misty, MD; Location: [place]; Service: Vascular; Laterality: Left; Attempted Thrombectomy of left Femoral/Popliteal graft - Unsuccessful. Insertion of Left femoral to below the knee popliteal propaten graft.   .  Mass biopsy   2009     "found mass that was cancer; later had chemo and radiation; never cut mass out" (01/22/2013)   .   Femoral-popliteal bypass graft  Left  09/05/2013     Procedure: Thrombectomy of Left Femoral-Below Knee Popliteal Bypass Graft; Patch Angioplasty of Distal Portion of Bypass Graft; Surgeon: Angelia Mould, MD; Location: [place]; Service: Vascular; Laterality: Left;   .  Patch angioplasty  Left  09/05/2013     Procedure: Patch Angioplasty Distal Portion of Left Femoral-Below Knee Popliteal Bypass Graft; Surgeon: Angelia Mould, MD; Location: [place]; Service: Vascular; Laterality: Left;   .  Intraoperative arteriogram  Left  09/05/2013     Procedure: INTRA OPERATIVE ARTERIOGRAM; Surgeon: Angelia Mould, MD; Location: [place]; Service: Vascular; Laterality: Left;   .  Fasciotomy  Left  09/05/2013     Procedure: FASCIOTOMY; Surgeon: Angelia Mould, MD; Location: [facility] OR; Service: Vascular; Laterality: Left;    Family History   Problem  Relation  Age of Onset   .  Cancer - Other  Sister     Social History: Married. Independent with cane PTA. He reports that he has quit smoking. His smoking use included Cigarettes. He has a 5.7 pack-year smoking history. He has never used smokeless tobacco. He reports that he does not drink alcohol or use illicit drugs.  Allergies   Allergen  Reactions   .  Other      Became hypotensive. Pt states allergic to "muscle relaxer" but unsure of name.   .  Tape  Hives and Itching     medfix non woven tape    Medications Prior to Admission   Medication  Sig  Dispense  Refill   .  Ascorbic Acid (VITAMIN C) 1000 MG tablet  Take 500 mg by mouth daily.     Marland Kitchen  dipyridamole-aspirin (AGGRENOX) 200-25 MG per 12 hr capsule  Take 1 capsule by mouth 2 (two) times daily.     Marland Kitchen  docusate sodium (COLACE) 100 MG capsule  Take 200 mg by mouth 2 (two) times daily as needed for mild constipation.     .  gabapentin (NEURONTIN) 300 MG capsule  Take 1 capsule (300 mg total) by mouth 3 (three) times daily.  90 capsule  6   .  insulin detemir (LEVEMIR) 100 UNIT/ML injection   Inject 10 Units into the skin daily.     Marland Kitchen  lisinopril (PRINIVIL,ZESTRIL) 5 MG tablet  Take 2.5 mg by mouth daily.     .  mesalamine (CANASA) 1000 MG suppository  Place 1 suppository (1,000 mg total) rectally at bedtime.  15 suppository  2   .  metFORMIN (GLUCOPHAGE) 500 MG tablet  Take 500 mg by mouth 2 (two) times daily with a meal.     .  omega-3 acid ethyl esters (LOVAZA) 1 G capsule  Take 2 g by  mouth 2 (two) times daily.     Marland Kitchen  omeprazole (PRILOSEC) 40 MG capsule  Take 40 mg by mouth daily.     Marland Kitchen  oxyCODONE-acetaminophen (PERCOCET) 5-325 MG per tablet  Take 1 tablet by mouth every 4 (four) hours as needed.  20 tablet  0   .  Probiotic Product (ALIGN PO)  Take 1 tablet by mouth at bedtime.      Home:  Home Living  Family/patient expects to be discharged to:: Private residence  Living Arrangements: Spouse/significant other  Available Help at Discharge: Family  Type of Home: House  Home Access: Stairs to enter  [employer] of Steps: 16 steps to enter the home  Entrance Stairs-Rails: [place]: Two level;Bed/bath upstairs  Alternate Level Stairs-Number of Steps: flight, patient has lift chair  Alternate Level Stairs-Rails: Right;Left  Home Equipment: Walker - 2 wheels;Cane - single point;Bedside commode;Shower seat;Hand held shower head;Grab bars - tub/shower  Additional Comments: Pt has a stair chair lift now for going upstairs.  Functional History:  Prior Function  Level of Independence: Independent with assistive device(s)  Comments: used cane PTA, has been using RW since recent admit for fempop BPG but wants to return to cane  Functional Status:  Mobility:  Bed Mobility  Overal bed mobility: Needs Assistance  Bed Mobility: Supine to Sit  Supine to sit: Min assist  Sit to supine: Min assist  General bed mobility comments: Uses momentum to get to EOB. Struggling a little.  Transfers  Overall transfer level: Needs assistance  Equipment used: Straight  cane  Transfers: Sit to/from [facility]  Sit to Stand: Min assist  Stand pivot transfers: Min assist  General transfer comment: increased assist for stability once standing. Able to take two steps to pivot to chair with slightly better stability. Practiced sit to stand x 2 with pt unable to take steps forward or back with left LE due to weakness. Pt needs min assist for stability. Pt then stated he needed to use bathroom therefore brought 3N1 to pt and he pivoted to it with min assist and cane. Pt irritated and demanding nurse come in therefore this PT went and got nurse and she came in when PT leaving.  Ambulation/Gait  General Gait Details: unable to tolerate at this time   ADL:  ADL  Overall ADL's : Needs assistance/impaired  Eating/Feeding: Set up;Bed level  Grooming: Brushing hair;Oral care;Sitting;Set up  Grooming Details (indicate cue type and reason): pt progressed to EOB and completing oral care with setup. Pt reports decr use of LT UE compared to baseline  Toilet Transfer: Moderate assistance;Stand-pivot;BSC (SPC)  Toilet Transfer Details (indicate cue type and reason): pt stand pivot to the right  Functional mobility during ADLs: Maximal assistance;Cane  General ADL Comments: Pt transfering to the right with SPC. pt with LOB x2 attempting to side step from 3n1 to recliner. pt with uncontrolled descend to chair. pt  Cognition:  Cognition  Overall Cognitive Status: Within Functional Limits for tasks assessed  Orientation Level: Oriented X4  Cognition  Arousal/Alertness: Awake/alert  Behavior During Therapy: WFL for tasks assessed/performed  Overall Cognitive Status: Within Functional Limits for tasks assessed   Physical Exam:  Blood pressure 113/37, pulse 71, temperature 98 F (36.7 C), temperature source Oral, resp. rate 17, height 5\' 9"  (1.753 m), weight 79.379 kg (175 lb), SpO2 95.00%.   HENT: oral mucosa pink and moist Head: Normocephalic.  Eyes: EOM are  normal.  Neck: Normal range of  motion. Neck supple. No thyromegaly present.  Cardiovascular: Normal rate and regular rhythm. No murmur Respiratory: Effort normal and breath sounds normal. No respiratory distress. No wheezes or rales GI: Soft. Bowel sounds are normal. He exhibits no distension.  Musculoskeletal: mild pain LUE with PROM today  Neurological: He is alert.  Speech is dysarthric but intelligible. He is oriented x3 and follows basic commands. Fair insight and awareness. motor strength is 5/5 in the right deltoid, bicep, tricep, grip, hip flexors, knee extensors, ankle dorsiflexion  3 minus to 2+ in the left deltoid, bicep, tricep, and grip with increased tone in the finger and wrist flexors as well as elbow Ashworth grade 2/4  1+ to 2 minus in the left ankle dorsiflexor, plantar flexors, 3 minus to 2+ in the knee extensor and hip flexor.  Decreased sensation left hand and foot.  Skin: left leg dressed-vacuum sealed  Results for orders placed during the hospital encounter of 09/05/13 (from the past 48 hour(s))   GLUCOSE, CAPILLARY Status: Abnormal    Collection Time    09/12/13 4:39 PM   Result  Value  Ref Range    Glucose-Capillary  115 (*)  70 - 99 mg/dL    Comment 1  Notify RN    GLUCOSE, CAPILLARY Status: Abnormal    Collection Time    09/12/13 9:16 PM   Result  Value  Ref Range    Glucose-Capillary  132 (*)  70 - 99 mg/dL    Comment 1  Notify RN     Comment 2  Documented in Chart    GLUCOSE, CAPILLARY Status: Abnormal    Collection Time    09/13/13 6:29 AM   Result  Value  Ref Range    Glucose-Capillary  128 (*)  70 - 99 mg/dL    Comment 1  Documented in Chart     Comment 2  Notify RN    GLUCOSE, CAPILLARY Status: Abnormal    Collection Time    09/13/13 11:12 AM   Result  Value  Ref Range    Glucose-Capillary  170 (*)  70 - 99 mg/dL   GLUCOSE, CAPILLARY Status: Abnormal    Collection Time    09/13/13 4:07 PM   Result  Value  Ref Range    Glucose-Capillary   105 (*)  70 - 99 mg/dL   GLUCOSE, CAPILLARY Status: Abnormal    Collection Time    09/13/13 8:39 PM   Result  Value  Ref Range    Glucose-Capillary  115 (*)  70 - 99 mg/dL    Comment 1  Documented in Chart     Comment 2  Notify RN    GLUCOSE, CAPILLARY Status: Abnormal    Collection Time    09/14/13 6:12 AM   Result  Value  Ref Range    Glucose-Capillary  120 (*)  70 - 99 mg/dL    Comment 1  Documented in Chart     Comment 2  Notify RN    GLUCOSE, CAPILLARY Status: Abnormal    Collection Time    09/14/13 11:20 AM   Result  Value  Ref Range    Glucose-Capillary  145 (*)  70 - 99 mg/dL    No results found.  Medical Problem List and Plan:  1. Functional deficits secondary to Deconditioning after thrombectomy of occluded femoropopliteal bypass as well as fasciotomy requiring wound vac in a patient with chronic left hemiparesis  2. DVT Prophylaxis/Anticoagulation: Pharmaceutical: Lovenox  3. Pain Management: Will  add OxyContin for more consistent pain relief--continue prn oxycodone. Continue Neurontin tid.  4. Mood: Provide ego support. LCSW to follow for evaluation and support.  5. Neuropsych: This patient is capable of making decisions on his own behalf.  6. HTN: Will monitor every 8 hours--has been on the low side. Continue Prinivil daily and adjust as needed 7. DM type 2: Will monitor BS with ac/hs checks. Continue metformin and levemir daily with SSI for elevated BS.  8. H/o CVA with left hemiparesis: On aggrenox bid for stroke prevention.  9. Anal cancer with liver mets/chronic intermittent constipation: Continue meslamine. Will augment bowel program due to constipation issues.  10. H/o chronic groin wound: Started on IV Ancef for wound prophylaxis on 09/11/13.    Post Admission Physician Evaluation:  1. Functional deficits secondary to Deconditioning after thrombectomy of occluded femoropopliteal bypass as well as fasciotomy requiring wound vac in a patient with chronic left  hemiparesis 2. Patient is admitted to receive collaborative, interdisciplinary care between the physiatrist, rehab nursing staff, and therapy team. 3. Patient's level of medical complexity and substantial therapy needs in context of that medical necessity cannot be provided at a lesser intensity of care such as a SNF. 4. Patient has experienced substantial functional loss from his/her baseline which was documented above under the "Functional History" and "Functional Status" headings. Judging by the patient's diagnosis, physical exam, and functional history, the patient has potential for functional progress which will result in measurable gains while on inpatient rehab. These gains will be of substantial and practical use upon discharge in facilitating mobility and self-care at the household level. 5. Physiatrist will provide 24 hour management of medical needs as well as oversight of the therapy plan/treatment and provide guidance as appropriate regarding the interaction of the two. 6. 24 hour rehab nursing will assist with bladder management, bowel management, safety, skin/wound care, disease management, medication administration, pain management and patient education and help integrate therapy concepts, techniques,education, etc. 7. PT will assess and treat for/with: Lower extremity strength, range of motion, stamina, balance, functional mobility, safety, adaptive techniques and equipment, NMR, spasticity mgt, pain mgt,manipulation of vacuum with mobility. Goals are: mod I to supervision. 8. OT will assess and treat for/with: ADL's, functional mobility, safety, upper extremity strength, adaptive techniques and equipment, NMR, spasticity mgt, manipulation of vacuum. Goals are: mod I to supervision. 9. SLP will assess and treat for/with: n/a. Goals are: n/a. 10. Case Management and Social Worker will assess and treat for psychological issues and discharge planning. 11. Team conference will be held weekly  to assess progress toward goals and to determine barriers to discharge. 12. Patient will receive at least 3 hours of therapy per day at least 5 days per week. 13. ELOS: 7-8 days  14. Prognosis: excellent  Meredith Staggers, MD, [facility]   09/14/2013

## 2013-09-14 NOTE — Progress Notes (Signed)
PMR Admission Coordinator Pre-Admission Assessment  Patient: Donald [name] is an 68 y.o., male  MRN: [id number]  DOB: February 12, 1946  Height: 5\' 9"  (175.3 cm)  Weight: 79.379 kg (175 lb)  Insurance Information  HMO: PPO: PCP: IPA: 80/20: OTHER:  PRIMARY: Medicare A & B Policy#: [id number] Subscriber: self  CM Name: Phone#: Fax#:  Pre-Cert#: verified in Occupational psychologist: retired  Benefits: Phone #: Name:  Eff. Date: A & B: 12-29-08 Deduct: $1260 Out of Pocket Max: none Life Max: unlimited  CIR: 100% SNF: 100% days 1-20; 80% days 21-100 (100 day visit max)  Outpatient: 80% Co-Pay: 20%  Home Health: 100% Co-Pay: none, no visit limits  DME: 80% Co-Pay: 20%  Providers: pt's preference   SECONDARY: AARP Policy#: [id number] Subscriber: self  Benefits: Phone #: [phone]  Emergency Contact Information    Contact Information     Name  Relation  Home  Work  Mobile     [place]  Spouse  [phone]       [place]  Other    [phone]        Current Medical History  Patient Admitting Diagnosis: Left femoral below knee popliteal bypass/history CVA  History of Present Illness: Donald [name] is a 68 y.o. right-handed male with history of CVA left-sided weakness 2008 and received inpatient rehabilitation services, hypertension, diabetes mellitus peripheral neuropathy and peripheral vascular disease and multiple revascularization procedures. Patient independent with a straight cane prior to admission living with his wife. Admitted 09/05/2013 with ischemic changes left leg as well as recent fall. X-rays did not reveal any fracture. Noted to be significantly anemic 7.5 and refused transfusion. Findings of occluded graft underwent thrombectomy of left femoral-popliteal bypass graft as well as 4 compartment fasciotomy 09/05/2013 per Dr. Scot Dock. Postoperative pain management. Maintained on Aggrenox for history of CVA as well as subcutaneous Lovenox for DVT prophylaxis. Hemoglobin remained  stable his latest hemoglobin 9.1. Hemoglobin A1c 7.7 with insulin therapy as directed. Wound care nurse followup for fasciotomy sites. Physical therapy evaluation completed 09/07/2013 with recommendations of physical medicine rehabilitation consult. Pt has had wound VAC placed to fasciotomy site to left leg and wound is being followed by Dr. Scot Dock.  Past Medical History    Past Medical History    Diagnosis  Date    .  GERD (gastroesophageal reflux disease)     .  Hyperlipidemia     .  Hypertension     .  Peripheral vascular disease     .  Internal and external bleeding hemorrhoids     .  History of gout       left great toe    .  Depression     .  Numbness in right leg       Hx: of diabetes    .  Coronary artery disease       RCA occlusion with good collaterals, o/w no obs CAD 12/2011    .  Pneumonia  ~ 1977    .  Type II diabetes mellitus     .  History of blood transfusion  12/2011; 12/2012      "before OR; after OR" (01/22/2013)    .  Stroke  2008      "left side weak; unable to move left hand still" (01/22/2013)    .  Rectal cancer  dx'd 08/2007      S/P chemo, radiation, biopsies    .  Metastases to the liver  dx'd 08/2010  Family History  family history includes Cancer - Other in his sister.  Prior Rehab/Hospitalizations: pt has been at CIR previously following a leg infection in 2014 and also after CVA in 2008. He has had follow up home health PT.  Current Medications  Current facility-administered medications:acetaminophen (TYLENOL) suppository 325-650 mg, 325-650 mg, Rectal, Q4H PRN, Angelia Mould, MD; acetaminophen (TYLENOL) tablet 325-650 mg, 325-650 mg, Oral, Q4H PRN, Angelia Mould, MD; alum & mag hydroxide-simeth (MAALOX/MYLANTA) 200-200-20 MG/5ML suspension 15-30 mL, 15-30 mL, Oral, Q2H PRN, Angelia Mould, MD  bisacodyl (DULCOLAX) suppository 10 mg, 10 mg, Rectal, Daily PRN, Samantha J Rhyne, PA-C, 10 mg at 09/09/13 1330; ceFAZolin (ANCEF) IVPB 1  g/50 mL premix, 1 g, Intravenous, Q8H, Angelia Mould, MD, 1 g at 09/14/13 1342; dextrose 5 %-0.45 % sodium chloride infusion, , Intravenous, Continuous, Angelia Mould, MD  dipyridamole-aspirin (AGGRENOX) 200-25 MG per 12 hr capsule 1 capsule, 1 capsule, Oral, BID, Angelia Mould, MD, 1 capsule at 09/14/13 1041; docusate sodium (COLACE) capsule 100 mg, 100 mg, Oral, Daily, Angelia Mould, MD, 100 mg at 09/14/13 1042; docusate sodium (COLACE) capsule 200 mg, 200 mg, Oral, BID PRN, Angelia Mould, MD, 200 mg at 09/13/13 2224  DOPamine (INTROPIN) 800 mg in dextrose 5 % 250 mL infusion, 3-5 mcg/kg/min, Intravenous, Daily PRN, Angelia Mould, MD; enoxaparin (LOVENOX) injection 40 mg, 40 mg, Subcutaneous, Q24H, Angelia Mould, MD, 40 mg at 09/14/13 1042; gabapentin (NEURONTIN) capsule 300 mg, 300 mg, Oral, TID, Angelia Mould, MD, 300 mg at 09/14/13 1042  guaiFENesin-dextromethorphan (ROBITUSSIN DM) 100-10 MG/5ML syrup 15 mL, 15 mL, Oral, Q4H PRN, Angelia Mould, MD; hydrALAZINE (APRESOLINE) injection 10 mg, 10 mg, Intravenous, Q2H PRN, Angelia Mould, MD; insulin aspart (novoLOG) injection 0-15 Units, 0-15 Units, Subcutaneous, TID WC, Angelia Mould, MD, 2 Units at 09/14/13 1237  insulin aspart (novoLOG) injection 0-5 Units, 0-5 Units, Subcutaneous, QHS, Angelia Mould, MD; Derrill Memo ON 09/15/2013] insulin detemir (LEVEMIR) injection 10 Units, 10 Units, Subcutaneous, QHS, Angelia Mould, MD; labetalol (NORMODYNE,TRANDATE) injection 10 mg, 10 mg, Intravenous, Q2H PRN, Angelia Mould, MD; lisinopril (PRINIVIL,ZESTRIL) tablet 2.5 mg, 2.5 mg, Oral, Daily, Angelia Mould, MD, 2.5 mg at 09/14/13 1042  magnesium sulfate IVPB 2 g 50 mL, 2 g, Intravenous, Daily PRN, Angelia Mould, MD; mesalamine (CANASA) suppository 1,000 mg, 1,000 mg, Rectal, QHS, Angelia Mould, MD, 1,000 mg at 09/13/13 2216; metFORMIN  (GLUCOPHAGE) tablet 500 mg, 500 mg, Oral, BID WC, Angelia Mould, MD, 500 mg at 09/14/13 3154; metoprolol (LOPRESSOR) injection 2-5 mg, 2-5 mg, Intravenous, Q2H PRN, Angelia Mould, MD  omega-3 acid ethyl esters (LOVAZA) capsule 2 g, 2 g, Oral, BID, Angelia Mould, MD, 2 g at 09/14/13 1042; oxyCODONE-acetaminophen (PERCOCET/ROXICET) 5-325 MG per tablet 1-2 tablet, 1-2 tablet, Oral, Q4H PRN, Angelia Mould, MD, 2 tablet at 09/14/13 1236; pantoprazole (PROTONIX) EC tablet 40 mg, 40 mg, Oral, Daily, Angelia Mould, MD, 40 mg at 09/14/13 1049  phenol (CHLORASEPTIC) mouth spray 1 spray, 1 spray, Mouth/Throat, PRN, Angelia Mould, MD; potassium chloride SA (K-DUR,KLOR-CON) CR tablet 20-40 mEq, 20-40 mEq, Oral, Daily PRN, Angelia Mould, MD; sodium chloride 0.9 % injection 10-40 mL, 10-40 mL, Intracatheter, PRN, Angelia Mould, MD, 10 mL at 09/12/13 0410  Patients Current Diet: Carb Control  Precautions / Restrictions  Precautions  Precautions: Fall  Precaution Comments: wound vac  Restrictions  Weight Bearing Restrictions: No  Prior Activity Level  Household: pt was mainly staying at home, got out rarely on errands/to go out to eat with wife (pt has been limited recently by his anal fissures & needing to use the bathroom quickly.) Pt used a single point cane and had resultant L UE weakness from prior CVA.  Home Assistive Devices / Equipment  Home Assistive Devices/Equipment: Cane (specify quad or straight)  Home Equipment: Walker - 2 wheels;Cane - single point;Bedside commode;Shower seat;Hand held shower head;Grab bars - tub/shower  Prior Functional Level  Prior Function  Level of Independence: Independent with assistive device(s)  Comments: used cane PTA, has been using RW since recent admit for fempop BPG but wants to return to cane  Current Functional Level    Cognition  Overall Cognitive Status: Within Functional Limits for tasks assessed   Orientation Level: Oriented X4    Extremity Assessment  (includes Sensation/Coordination)      ADLs  Overall ADL's : Needs assistance/impaired  Eating/Feeding: Set up;Bed level  Grooming: Brushing hair;Oral care;Sitting;Set up  Grooming Details (indicate cue type and reason): pt progressed to EOB and completing oral care with setup. Pt reports decr use of LT UE compared to baseline  Toilet Transfer: Moderate assistance;Stand-pivot;BSC (SPC)  Toilet Transfer Details (indicate cue type and reason): pt stand pivot to the right  Functional mobility during ADLs: Maximal assistance;Cane  General ADL Comments: Pt transfering to the right with SPC. pt with LOB x2 attempting to side step from 3n1 to recliner. pt with uncontrolled descend to chair. pt    Mobility  Overal bed mobility: Needs Assistance  Bed Mobility: Supine to Sit  Supine to sit: Min assist  Sit to supine: Min assist  General bed mobility comments: Uses momentum to get to EOB. Struggling a little.    Transfers  Overall transfer level: Needs assistance  Equipment used: Straight cane  Transfers: Sit to/from [facility]  Sit to Stand: Min assist  Stand pivot transfers: Min assist  General transfer comment: increased assist for stability once standing. Able to take two steps to pivot to chair with slightly better stability. Practiced sit to stand x 2 with pt unable to take steps forward or back with left LE due to weakness. Pt needs min assist for stability. Pt then stated he needed to use bathroom therefore brought 3N1 to pt and he pivoted to it with min assist and cane. Pt irritated and demanding nurse come in therefore this PT went and got nurse and she came in when PT leaving.    Ambulation / Gait / Stairs / Wheelchair Mobility  Ambulation/Gait  General Gait Details: unable to tolerate at this time    Posture / Balance     Special needs/care consideration  BiPAP/CPAP no  CPM no  Continuous Drip IV no  Dialysis no   Life Vest no  Oxygen no  Special Bed no  Trach Size no  Wound Vac (area) no  Skin - Significant skin issues with L LE: Medial incision is c/d/i with sutures in place; lateral wound vac in place with good seal.  Bowel mgmt: last BM on 09-09-13  Bladder mgmt:using urinal  Diabetic mgmt - yes, managed with meds and insulin by pt's wife. Wife checks his sugars.  Access: pt has porta cath for IV access  Pt wears glasses all the time  Note: pt prefers to have male caregivers for bathing. Charge Rn informed. Pt/Wife told that we will try to accommodate this preference but told that staffing needs are a fluid  dynamic.    Previous Home Environment  Living Arrangements: Spouse/significant other  Available Help at Discharge: Family  Type of Home: House  Home Layout: Two level;Bed/bath upstairs  Alternate Level Stairs-Rails: Right;Left  Alternate Level Stairs-Number of Steps: flight, patient has lift chair  Home Access: Stairs to enter  Entrance Stairs-Rails: Right;Left  Entrance Stairs-Number of Steps: 16 steps to enter the home  Bathroom Shower/Tub: Walk-in shower;Door  [employer] Toilet: Handicapped height  Bathroom Accessibility: Yes  How Accessible: Accessible via walker  Home Care Services: No  Additional Comments: Pt has a stair chair lift now for going upstairs.  Discharge Living Setting  Plans for Discharge Living Setting: Patient's home  Type of Home at Discharge: House  Discharge Home Layout: Two level;Bed/bath upstairs  Alternate Level Stairs-Number of Steps: flight (has a stair lift to reach 2nd floor bedroom)  Discharge Home Access: Stairs to enter  Entrance Stairs-Rails: None  Entrance Stairs-Number of Steps: 1 (one step to back door porch, 16 steps w/ bil. rails front do)  Does the patient have any problems obtaining your medications?: No  Social/Family/Support Systems  Patient Roles: Spouse  Contact Information: wife Bethena Roys is primary contact  Anticipated Caregiver: wife  Bethena Roys  Anticipated Ambulance person Information: see above  Ability/Limitations of Caregiver: no limitations, wife has been caregiver before with pt  Caregiver Availability: 24/7  Discharge Plan Discussed with Primary Caregiver: Yes (discussed with pt/wife/family on 5-13, on 5-15 and 5-18)  Is Caregiver In Agreement with Plan?: Yes  Does Caregiver/Family have Issues with Lodging/Transportation while Pt is in Rehab?: No  Goals/Additional Needs  Patient/Family Goal for Rehab: Supervision and Mod Ind with PT/OT, NA for SLP  Expected length of stay: 7-10 days  Cultural Considerations: none  Dietary Needs: carb modified, thin liquids  Equipment Needs: to be determined  Pt/Family Agrees to Admission and willing to participate: Yes (discussed with wife/pt on 09-09-13, 5-15 and 5-18)  Program Orientation Provided & Reviewed with Pt/Caregiver Including Roles & Responsibilities: Yes  Decrease burden of Care through IP rehab admission: NA  Possible need for SNF placement upon discharge: not anticipated  Patient Condition: This patient's medical and functional status has changed since the consult dated: 09-09-13 in which the Rehabilitation Physician determined and documented that the patient's condition is appropriate for intensive rehabilitative care in an inpatient rehabilitation facility. See "History of Present Illness" (above) for medical update. Functional changes are: minimal assist for sit to stand and transfers but no ambulation due to pain. Patient's medical and functional status update has been discussed with the Rehabilitation physician and patient remains appropriate for inpatient rehabilitation. Will admit to inpatient rehab today.  Preadmission Screen Completed By: Ave Filter, PT 09/14/2013 2:36 PM  ______________________________________________________________________  Discussed status with Dr. Naaman Plummer on 09-14-13 at 1446 and received telephone approval for admission today.  Admission  Coordinator: Ave Filter, PT time 1446/Date 09-14-13    Cosigned by: Meredith Staggers, MD [09/14/2013 2:56 PM]

## 2013-09-14 NOTE — Progress Notes (Signed)
Physical Therapy Treatment Patient Details Name: Donald [name] MRN: [id number] DOB: [birth year] Today's Date: 09/14/2013    History of Present Illness Patient is a 68 yo male with complicated vascular history s/p trombectomy of Left Femoral-Below Knee Popliteal Bypass Graft; Patch Angioplasty of Distal Portion of Bypass Graft (Left) Patch Angioplasty Distal Portion of Left Femoral-Below Knee Popliteal Bypass Graft (Left) and fasciotomy (Left).  Closure of fasciotomy on 09/11/13.  Vac still in place.     PT Comments    Pt admitted with above. Pt currently with functional limitations due to balance and endurance deficits.  Spoke at length with pt and wife re: d/c plan.  Discussed how difficult it will be for pt to ambulate given that he can't put weight on his LLE at present and his LLE is weak.  Discussed that pt may need wheelchair.  Wife states wheelchair wouldn't work previously but after discussing it appears that it was a wide wheelchair.  Pt has a hospital bed in the garage as well.  They may need a ramp built as they only have 1 step at one entrance.  Feel Rehab can really help this pt with his discharge to make a smooth transition to home with possibility of performing home eval prior to d/c.  Continue to recommened Rehab.  If Rehab does not take pt, will need NH with therapy.  Pt will benefit from skilled PT to increase their independence and safety with mobility to allow discharge to the venue listed below.   Follow Up Recommendations  CIR;Supervision/Assistance - 24 hour     Equipment Recommendations  Other (comment) (TBA)    Recommendations for Other Services Rehab consult     Precautions / Restrictions Precautions Precautions: Fall Precaution Comments: wound vac Restrictions Weight Bearing Restrictions: No    Mobility  Bed Mobility Overal bed mobility: Needs Assistance Bed Mobility: Supine to Sit     Supine to sit: Min assist     General bed mobility comments:  Uses momentum to get to EOB.  Struggling a little.  Transfers Overall transfer level: Needs assistance Equipment used: Straight cane Transfers: Sit to/from [facility] Sit to Stand: Min assist Stand pivot transfers: Min assist       General transfer comment: increased assist for stability once standing.  Able to take two steps to pivot to chair with slightly better stability.  Practiced sit to stand x 2 with pt unable to take steps forward or back with left LE due to weakness.  Pt needs min assist for stability.  Pt then stated he needed to use bathroom therefore brought 3N1 to pt and he pivoted to it with min assist and cane.  Pt irritated and demanding nurse come in therefore this PT went and got nurse and she came in when PT leaving.    Ambulation/Gait                 Stairs            Wheelchair Mobility    Modified Rankin (Stroke Patients Only)       Balance Overall balance assessment: Needs assistance;History of Falls Sitting-balance support: No upper extremity supported;Feet supported Sitting balance-Leahy Scale: Fair     Standing balance support: Single extremity supported;During functional activity Standing balance-Leahy Scale: Poor Standing balance comment: Continues to be unsteady even with cane.  Pt has trouble weight bearing on left LE due to pain.  Cognition Arousal/Alertness: Awake/alert Behavior During Therapy: WFL for tasks assessed/performed Overall Cognitive Status: Within Functional Limits for tasks assessed                      Exercises General Exercises - Lower Extremity Ankle Circles/Pumps: AROM;Both;10 reps;Supine    General Comments General comments (skin integrity, edema, etc.): wound vac in place LLE      Pertinent Vitals/Pain VSS, LLE pain 8/10 per pt.    Home Living                      Prior Function            PT Goals (current goals can now be found in  the care plan section) Progress towards PT goals: Not progressing toward goals - comment (limited each treatment by pain LLE)    Frequency  Min 3X/week    PT Plan Current plan remains appropriate    Co-evaluation             End of Session Equipment Utilized During Treatment: Gait belt Activity Tolerance: Patient limited by pain Patient left: in chair;with call bell/phone within reach;with family/visitor present     Time: 0934-1000 PT Time Calculation (min): 26 min  Charges:  $Therapeutic Activity: 23-37 mins                    G Codes:      Rayshard Schirtzinger Dorene Ar 2013-10-06, 10:50 AM [facility] [phone] (pager)

## 2013-09-14 NOTE — Discharge Summary (Signed)
[facility] Discharge Summary  Donald [name] 28-Apr-1946 67 y.o. male  [id number]  Admission Date: 09/05/2013  Discharge Date: 09/14/13  Physician: Angelia Mould, MD  Admission Diagnosis: left leg swelling  left leg embolus hx of Ischemic Left Lower Extremity hx of Fasciotomy Left Lower Extremity   HPI:   This is a 68 y.o. male who developed the sudden onset of pain in his left leg at 4 AM today. He did not come to the ED until this afternoon. Of note, the patient fell yesterday and presented to the emergency department with left hip pain. Hip x-rays did not show any fracture. He was however noted to be significantly anemic with a hemoglobin of 7.5, and at the emergency department recommended transfusion. However the patient refused and wanted to leave.  The patient has a complicated vascular history. According to the family he has had work on his left axillobifemoral bypass graft several times. 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 Oneida Alar had recommended removal of his graft. However removal of the graft would possibly result in bilateral lower extremity amputation and the patient did not wish to have graft removal. 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  Os recently, on 01/06/2013 the patient had a left femoral to below knee popliteal artery bypass with a vein graft and patch angioplasty of the left common femoral artery by Dr. Oneida Alar.  He was taken back to the afternoon on 01/10/2013 by Dr. Kellie Simmering with an occluded bypass graft. He underwent attempted thrombectomy of the graft which was unsuccessful and therefore a new left common femoral to below knee popliteal artery bypass graft with PTFE was performed.  The patient has had a previous right brain stroke with left-sided weakness. He has been ambulatory with a cane.  Hospital Course:  The patient was admitted to the hospital and taken to the operating room  on 09/05/2013 - 09/11/2013 and underwent: 1. Thrombectomy of left femoropopliteal bypass graft  2. Bovine pericardial patch of distal bypass graft  3. Intraoperative arteriogram  4. 4 compartment fasciotomy    The pt tolerated the procedure well and was transported to the PACU in good condition.   On POD 1, a vac was placed to the medial and lateral fasciotomy sites.  He is on Keflex indefinitely for his left groin with a chronic sinus that Dr. Oneida Alar does not believe will heal and he is at very high risk for limb loss.  Dr. Scot Dock felt he could start coumadin on the pt to help keep graft open, but pt refused and he would be at increased risk for bleeding from a chronic left groin wound infection.  He was transferred to the telemetry unit that day.  He is continued on indefinite ABx.  On 09/11/13, he was taken to the operating room and underwent closure of medial fasciotomy site.   The medial fasciotomy site looked good and the muscle was viable.  The muscle of the lateral fasciotomy did not contract well and it was noted that the lateral and anterior compartments did not appear viable although there was no evidence of significant infection. He has mild erythema on the anterior aspect of his leg. If the erythema progresses or the muscle becomes infected he will clearly require an above-knee amputation. I have discussed this with the patient's wife. He had a prolonged period of ischemia. He had developed a cold left leg at 4 AM but had  not presented to the emergency department until later in the afternoon that day. We'll need to follow her exam closely. His VAC change will be on Monday Wednesday and Friday and I will try to inspect the wound that time.  He did have some issues with constipation post operatively.  He was placed back on his home regimen.  His left IJ central line was removed and his porta cath was accessed for IV access.  On 09/14/13, the vac to the lateral fasciotomy site was removed and  per Dr. Scot Dock, the fasciotomy site looks reasonable.  The erythema on the anterior aspect of the has improved slightly.  The muscle does not contract and the best hope is that this will scar in.  The only other option would be an AKA, therefore, Dr. Scot Dock will give him the benefit of the doubt and continue the vac.  He was discharged to CIR on this day.  The remainder of the hospital course consisted of increasing mobilization and increasing intake of solids without difficulty.  CBC    Component Value Date/Time   WBC 8.6 09/06/2013 0005   WBC 7.1 06/05/2013 1010   RBC 3.96* 09/06/2013 0005   RBC 4.23 06/05/2013 1010   RBC 3.54* 01/26/2012 0455   HGB 9.1* 09/06/2013 0005   HGB 9.3* 06/05/2013 1010   HCT 30.0* 09/06/2013 0005   HCT 30.8* 06/05/2013 1010   PLT 223 09/06/2013 0005   PLT 324 06/05/2013 1010   MCV 75.8* 09/06/2013 0005   MCV 72.9* 06/05/2013 1010   MCH 23.0* 09/06/2013 0005   MCH 22.0* 06/05/2013 1010   MCHC 30.3 09/06/2013 0005   MCHC 30.2* 06/05/2013 1010   RDW 18.8* 09/06/2013 0005   RDW 16.9* 06/05/2013 1010   LYMPHSABS 1.8 09/04/2013 2123   LYMPHSABS 1.6 06/05/2013 1010   MONOABS 0.6 09/04/2013 2123   MONOABS 0.5 06/05/2013 1010   EOSABS 0.3 09/04/2013 2123   EOSABS 0.1 06/05/2013 1010   BASOSABS 0.1 09/04/2013 2123   BASOSABS 0.1 06/05/2013 1010    BMET    Component Value Date/Time   NA 139 09/06/2013 0845   NA 137 06/05/2013 1010   NA 147* 10/12/2011 0959   K 4.8 09/06/2013 0845   K 5.4* 06/05/2013 1010   K 5.2* 10/12/2011 0959   CL 105 09/06/2013 0845   CL 102 05/19/2012 1427   CL 102 10/12/2011 0959   CO2 21 09/06/2013 0845   CO2 23 06/05/2013 1010   CO2 29 10/12/2011 0959   GLUCOSE 180* 09/06/2013 0845   GLUCOSE 239* 06/05/2013 1010   GLUCOSE 205* 05/19/2012 1427   GLUCOSE 244* 10/12/2011 0959   BUN 17 09/06/2013 0845   BUN 19.5 06/05/2013 1010   BUN 13 10/12/2011 0959   CREATININE 1.07 09/12/2013 0459   CREATININE 1.4* 06/05/2013 1010   CREATININE 1.0 10/12/2011 0959   CALCIUM 7.9* 09/06/2013 0845    CALCIUM 10.1 06/05/2013 1010   CALCIUM 9.0 10/12/2011 0959   GFRNONAA 69* 09/12/2013 0459   GFRAA 80* 09/12/2013 0459     Discharge Instructions:   The patient is discharged to home with extensive instructions on wound care and progressive ambulation.  They are instructed not to drive or perform any heavy lifting until returning to see the physician in his office.      Discharge Instructions   Call MD for:  redness, tenderness, or signs of infection (pain, swelling, bleeding, redness, odor or green/yellow discharge around incision site)    Complete by:  As directed      Call MD for:  severe or increased pain, loss or decreased feeling  in affected limb(s)    Complete by:  As directed      Call MD for:  temperature >100.5    Complete by:  As directed      Discharge wound care:    Complete by:  As directed   Continue current settings on wound vac     Resume previous diet    Complete by:  As directed            Discharge Diagnosis:  left leg swelling  left leg embolus hx of Ischemic Left Lower Extremity hx of Fasciotomy Left Lower Extremity  Secondary Diagnosis: Patient Active Problem List   Diagnosis Date Noted  . Ischemic leg 09/05/2013  . Wound discharge 04/09/2013  . PVD (peripheral vascular disease) with claudication 01/14/2013  . Hemiparesis affecting left side as late effect of stroke 01/05/2013  . Abnormality of gait 09/03/2012  . Other acquired deformity of ankle and foot(736.79) 09/03/2012  . Aftercare following surgery of the circulatory system, [place] 05/15/2012  . 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 Peripheral vascular disease, unspecified 03/06/2012  . PVD (peripheral vascular disease) 02/21/2012  . Occlusion and stenosis of carotid artery without mention of cerebral infarction 02/21/2012  . Physical deconditioning 01/31/2012  . Atherosclerotic peripheral vascular disease with rest pain 01/24/2012  .  Preoperative evaluation to rule out surgical contraindication 01/24/2012  . Anal carcinoma 10/30/2011  . Anal cancer 02/26/2011  . [place] MALIGNANT NEOPLASM OF LARGE INTESTINE&RECTUM 11/09/2008  . NAUSEA AND VOMITING 11/09/2008  . CONSTIPATION 08/18/2008  . RECTAL BLEEDING 08/18/2008  . ABDOMINAL PAIN -GENERALIZED 08/18/2008  . DM 08/17/2008  . HYPERLIPIDEMIA 08/17/2008  . HEMORRHOIDS 08/17/2008  . CONSTIPATION, CHRONIC 08/17/2008  . CEREBROVASCULAR ACCIDENT, HX OF 08/17/2008   Past Medical History  Diagnosis Date  . GERD (gastroesophageal reflux disease)   . Hyperlipidemia   . Hypertension   . Peripheral vascular disease   . Internal and external bleeding hemorrhoids   . History of gout     left great toe  . Depression   . Numbness in right leg     Hx: of diabetes  . Coronary artery disease     RCA occlusion with good collaterals, o/w no obs CAD 12/2011  . Pneumonia ~ 1977  . Type II diabetes mellitus   . History of blood transfusion 12/2011; 12/2012    "before OR; after OR" (01/22/2013)  . Stroke 2008    "left side weak; unable to move left hand still" (01/22/2013)  . Rectal cancer dx'd 08/2007    S/P chemo, radiation, biopsies  . Metastases to the liver dx'd 08/2010       Medication List         ALIGN PO  Take 1 tablet by mouth at bedtime.     cephALEXin 500 MG capsule  Commonly known as:  KEFLEX  Take 1 capsule (500 mg total) by mouth 4 (four) times daily.     dipyridamole-aspirin 200-25 MG per 12 hr capsule  Commonly known as:  AGGRENOX  Take 1 capsule by mouth 2 (two) times daily.     docusate sodium 100 MG capsule  Commonly known as:  COLACE  Take 200 mg by mouth 2 (two) times daily as needed for mild constipation.     gabapentin 300 MG capsule  Commonly known as:  NEURONTIN  Take 1  capsule (300 mg total) by mouth 3 (three) times daily.     insulin detemir 100 UNIT/ML injection  Commonly known as:  LEVEMIR  Inject 10 Units into the skin daily.      lisinopril 5 MG tablet  Commonly known as:  PRINIVIL,ZESTRIL  Take 2.5 mg by mouth daily.     mesalamine 1000 MG suppository  Commonly known as:  CANASA  Place 1 suppository (1,000 mg total) rectally at bedtime.     metFORMIN 500 MG tablet  Commonly known as:  GLUCOPHAGE  Take 500 mg by mouth 2 (two) times daily with a meal.     omega-3 acid ethyl esters 1 G capsule  Commonly known as:  LOVAZA  Take 2 g by mouth 2 (two) times daily.     omeprazole 40 MG capsule  Commonly known as:  PRILOSEC  Take 40 mg by mouth daily.     oxyCODONE-acetaminophen 5-325 MG per tablet  Commonly known as:  PERCOCET  Take 1 tablet by mouth every 4 (four) hours as needed.     vitamin C 1000 MG tablet  Take 500 mg by mouth daily.        Disposition: CIR  Patient's condition: is good  Follow up: 1. Will see him while he is in CIR.  F/u in office dependent of CIR course and discharge date.   Leontine Locket, PA-C [facility] [phone] 09/14/2013  3:11 PM  - For VQI Registry use --- Instructions: Press F2 to tab through selections.  Delete question if not applicable.  S/p: 1. Thrombectomy of left femoropopliteal bypass graft  2. Bovine pericardial patch of distal bypass graft  3. Intraoperative arteriogram  4. 4 compartment fasciotomy  Post-op:  Wound infection: Yes - left groin (previous to this admission) Graft infection: No  Transfusion: No  If yes, n/a units given New Arrhythmia: No Ipsilateral amputation: No, [ ]  Minor, [ ]  BKA, [ ]  AKA Discharge patency: [ ]  Primary, [ ]  Primary assisted, [x]  Secondary, [ ]  Occluded Patency judged by: [ x] Dopper only, [ ]  Palpable graft pulse, [ ]  Palpable distal pulse, [ ]  ABI inc. > 0.15, [ ]  Duplex Discharge ABI: R not done, L not done Discharge TBI: R , L  D/C Ambulatory Status: Ambulatory with Assistance  Complications: MI: No, [ ]  Troponin only, [ ]  EKG or Clinical CHF: No Resp failure:No, [ ]  Pneumonia, [ ]   Ventilator Chg in renal function: No, [ ]  Inc. Cr > 0.5, [ ]  Temp. Dialysis, [ ]  Permanent dialysis Stroke: No, [ ]  Minor, [ ]  Major Return to OR: yes for fasciotomy closure  Reason for return to OR: [ ]  Bleeding, [ ]  Infection, [ ]  Thrombosis, [ ]  Revision  Discharge medications: Statin use:  no ASA use:  Yes (Aggrenox) Plavix use:  no Beta blocker use: no Coumadin use: no

## 2013-09-14 NOTE — Progress Notes (Signed)
Rehab admissions - I am following pt's case and made note of Dr. Nicole Cella latest progress note, indicating that pt is appropriate for inpatient rehab and that he will continue to follow pt's wound. I then spoke directly with Dr. Scot Dock to confirm this and he gave medical clearance for CIR. I then spoke with vascular PA as well.  I updated pt and his wife and they are agreeable to come to CIR. Bed is available and will admit pt later today to CIR. I updated pt's RN and Almyra Free, case Freight forwarder as well.   Please call me with any questions. Thanks.  Nanetta Batty, PT Rehabilitation Admissions Coordinator [phone]

## 2013-09-14 NOTE — PMR Pre-admission (Signed)
PMR Admission Coordinator Pre-Admission Assessment  Patient: Donald [name] is an 68 y.o., male MRN: [id number] DOB: [birth year] Height: 5\' 9"  (175.3 cm) Weight: 79.379 kg (175 lb)              Insurance Information HMO:     PPO:      PCP:      IPA:      80/20:      OTHER:  PRIMARY: Medicare A & B      Policy#: [id number]      Subscriber: self CM Name:       Phone#:      Fax#:  Pre-Cert#: verified in Visual merchandiser: retired Benefits:  Phone #:      Name:  Eff. Date: A & B: 12-29-08     Deduct: $1260      Out of Pocket Max: none       Life Max: unlimited CIR: 100%      SNF: 100% days 1-20; 80% days 21-100 (100 day visit max) Outpatient: 80%     Co-Pay: 20% Home Health: 100%      Co-Pay: none, no visit limits DME: 80%     Co-Pay: 20% Providers: pt's preference  SECONDARY: A[employer]      Policy#: [id number]      Subscriber: self Benefits:  Phone #: [phone]      Emergency Contact Information Contact Information   Name Relation Home Work Mobile   Donald [name] Spouse [phone]     Donald [name] Other   [phone]     Current Medical History  Patient Admitting Diagnosis: Left femoral below knee popliteal bypass/history CVA  History of Present Illness: Donald [name] is a 68 y.o. right-handed male with history of CVA left-sided weakness 2008 and received inpatient rehabilitation services, hypertension, diabetes mellitus peripheral neuropathy and peripheral vascular disease and multiple revascularization procedures. Patient independent with a straight cane prior to admission living with his wife. Admitted 09/05/2013 with ischemic changes left leg as well as recent fall. X-rays did not reveal any fracture. Noted to be significantly anemic 7.5 and refused transfusion. Findings of occluded graft underwent thrombectomy of left femoral-popliteal bypass graft as well as 4 compartment fasciotomy 09/05/2013 per Dr. Scot Dock. Postoperative pain management. Maintained on Aggrenox for  history of CVA as well as subcutaneous Lovenox for DVT prophylaxis. Hemoglobin remained stable his latest hemoglobin 9.1. Hemoglobin A1c 7.7 with insulin therapy as directed. Wound care nurse followup for fasciotomy sites. Physical therapy evaluation completed 09/07/2013 with recommendations of physical medicine rehabilitation consult. Pt has had wound VAC placed to fasciotomy site to left leg and wound is being followed by Dr. Scot Dock.   Past Medical History  Past Medical History  Diagnosis Date  . GERD (gastroesophageal reflux disease)   . Hyperlipidemia   . Hypertension   . Peripheral vascular disease   . Internal and external bleeding hemorrhoids   . History of gout     left great toe  . Depression   . Numbness in right leg     Hx: of diabetes  . Coronary artery disease     RCA occlusion with good collaterals, o/w no obs CAD 12/2011  . Pneumonia ~ 1977  . Type II diabetes mellitus   . History of blood transfusion 12/2011; 12/2012    "before OR; after OR" (01/22/2013)  . Stroke 2008    "left side weak; unable to move left hand still" (01/22/2013)  . Rectal cancer dx'd 08/2007  S/P chemo, radiation, biopsies  . 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  Prior Rehab/Hospitalizations: pt has been at [facility] previously following a leg infection in 2014 and also after CVA in 2008. He has had follow up home health PT.   Current Medications  Current facility-administered medications:acetaminophen (TYLENOL) suppository 325-650 mg, 325-650 mg, Rectal, Q4H PRN, Angelia Mould, MD;  acetaminophen (TYLENOL) tablet 325-650 mg, 325-650 mg, Oral, Q4H PRN, Angelia Mould, MD;  alum & mag hydroxide-simeth (MAALOX/MYLANTA) 200-200-20 MG/5ML suspension 15-30 mL, 15-30 mL, Oral, Q2H PRN, Angelia Mould, MD bisacodyl (DULCOLAX) suppository 10 mg, 10 mg, Rectal, Daily PRN, Samantha J Rhyne, PA-C, 10 mg at 09/09/13 1330;   ceFAZolin (ANCEF) IVPB 1 g/50 mL premix, 1 g, Intravenous, Q8H, Angelia Mould, MD, 1 g at 09/14/13 1342;  dextrose 5 %-0.45 % sodium chloride infusion, , Intravenous, Continuous, Angelia Mould, MD dipyridamole-aspirin (AGGRENOX) 200-25 MG per 12 hr capsule 1 capsule, 1 capsule, Oral, BID, Angelia Mould, MD, 1 capsule at 09/14/13 1041;  docusate sodium (COLACE) capsule 100 mg, 100 mg, Oral, Daily, Angelia Mould, MD, 100 mg at 09/14/13 1042;  docusate sodium (COLACE) capsule 200 mg, 200 mg, Oral, BID PRN, Angelia Mould, MD, 200 mg at 09/13/13 2224 DOPamine (INTROPIN) 800 mg in dextrose 5 % 250 mL infusion, 3-5 mcg/kg/min, Intravenous, Daily PRN, Angelia Mould, MD;  enoxaparin (LOVENOX) injection 40 mg, 40 mg, Subcutaneous, Q24H, Angelia Mould, MD, 40 mg at 09/14/13 1042;  gabapentin (NEURONTIN) capsule 300 mg, 300 mg, Oral, TID, Angelia Mould, MD, 300 mg at 09/14/13 1042 guaiFENesin-dextromethorphan (ROBITUSSIN DM) 100-10 MG/5ML syrup 15 mL, 15 mL, Oral, Q4H PRN, Angelia Mould, MD;  hydrALAZINE (APRESOLINE) injection 10 mg, 10 mg, Intravenous, Q2H PRN, Angelia Mould, MD;  insulin aspart (novoLOG) injection 0-15 Units, 0-15 Units, Subcutaneous, TID WC, Angelia Mould, MD, 2 Units at 09/14/13 1237 insulin aspart (novoLOG) injection 0-5 Units, 0-5 Units, Subcutaneous, QHS, Angelia Mould, MD;  Derrill Memo ON 09/15/2013] insulin detemir (LEVEMIR) injection 10 Units, 10 Units, Subcutaneous, QHS, Angelia Mould, MD;  labetalol (NORMODYNE,TRANDATE) injection 10 mg, 10 mg, Intravenous, Q2H PRN, Angelia Mould, MD;  lisinopril (PRINIVIL,ZESTRIL) tablet 2.5 mg, 2.5 mg, Oral, Daily, Angelia Mould, MD, 2.5 mg at 09/14/13 1042 magnesium sulfate IVPB 2 g 50 mL, 2 g, Intravenous, Daily PRN, Angelia Mould, MD;  mesalamine (CANASA) suppository 1,000 mg, 1,000 mg, Rectal, QHS, Angelia Mould, MD, 1,000 mg at  09/13/13 2216;  metFORMIN (GLUCOPHAGE) tablet 500 mg, 500 mg, Oral, BID WC, Angelia Mould, MD, 500 mg at 09/14/13 4098;  metoprolol (LOPRESSOR) injection 2-5 mg, 2-5 mg, Intravenous, Q2H PRN, Angelia Mould, MD omega-3 acid ethyl esters (LOVAZA) capsule 2 g, 2 g, Oral, BID, Angelia Mould, MD, 2 g at 09/14/13 1042;  oxyCODONE-acetaminophen (PERCOCET/ROXICET) 5-325 MG per tablet 1-2 tablet, 1-2 tablet, Oral, Q4H PRN, Angelia Mould, MD, 2 tablet at 09/14/13 1236;  pantoprazole (PROTONIX) EC tablet 40 mg, 40 mg, Oral, Daily, Angelia Mould, MD, 40 mg at 09/14/13 1049 phenol (CHLORASEPTIC) mouth spray 1 spray, 1 spray, Mouth/Throat, PRN, Angelia Mould, MD;  potassium chloride SA (K-DUR,KLOR-CON) CR tablet 20-40 mEq, 20-40 mEq, Oral, Daily PRN, Angelia Mould, MD;  sodium chloride 0.9 % injection 10-40 mL, 10-40 mL, Intracatheter, PRN, Angelia Mould, MD, 10 mL at 09/12/13 0410  Patients Current  Diet: Carb Control  Precautions / Restrictions Precautions Precautions: Fall Precaution Comments: wound vac Restrictions Weight Bearing Restrictions: No   Prior Activity Level Household: pt was mainly staying at home, got out rarely on errands/to go out to eat with wife (pt has been limited recently by his anal fissures & needing to use the bathroom quickly.) Pt used a single point cane and had resultant L UE weakness from prior CVA.  Home Assistive Devices / Equipment Home Assistive Devices/Equipment: Cane (specify quad or straight) Home Equipment: Walker - 2 wheels;Cane - single point;Bedside commode;Shower seat;Hand held shower head;Grab bars - tub/shower  Prior Functional Level Prior Function Level of Independence: Independent with assistive device(s) Comments: used cane PTA, has been using RW since recent admit for fempop BPG but wants to return to cane  Current Functional Level Cognition  Overall Cognitive Status: Within Functional Limits  for tasks assessed Orientation Level: Oriented X4    Extremity Assessment (includes Sensation/Coordination)          ADLs  Overall ADL's : Needs assistance/impaired Eating/Feeding: Set up;Bed level Grooming: Brushing hair;Oral care;Sitting;Set up Grooming Details (indicate cue type and reason): pt progressed to EOB and completing oral care with setup. Pt reports decr use of LT UE compared to baseline Toilet Transfer: Moderate assistance;Stand-pivot;BSC (SPC) Toilet Transfer Details (indicate cue type and reason): pt stand pivot to the right Functional mobility during ADLs: Maximal assistance;Cane General ADL Comments: Pt transfering to the right with SPC. pt with LOB x2 attempting to side step from 3n1 to recliner. pt with uncontrolled descend to chair. pt     Mobility  Overal bed mobility: Needs Assistance Bed Mobility: Supine to Sit Supine to sit: Min assist Sit to supine: Min assist General bed mobility comments: Uses momentum to get to EOB.  Struggling a little.    Transfers  Overall transfer level: Needs assistance Equipment used: Straight cane Transfers: Sit to/from [facility] Sit to Stand: Min assist Stand pivot transfers: Min assist General transfer comment: increased assist for stability once standing.  Able to take two steps to pivot to chair with slightly better stability.  Practiced sit to stand x 2 with pt unable to take steps forward or back with left LE due to weakness.  Pt needs min assist for stability.  Pt then stated he needed to use bathroom therefore brought 3N1 to pt and he pivoted to it with min assist and cane.  Pt irritated and demanding nurse come in therefore this PT went and got nurse and she came in when PT leaving.      Ambulation / Gait / Stairs / Wheelchair Mobility  Ambulation/Gait General Gait Details: unable to tolerate at this time    Posture / Balance      Special needs/care consideration BiPAP/CPAP no   CPM no  Continuous  Drip IV no  Dialysis no          Life Vest no  Oxygen no  Special Bed no  Trach Size no  Wound Vac (area) no       Skin -    Significant skin issues with L LE: Medial incision is c/d/i with sutures in place; lateral wound vac in place with good seal.                   Bowel mgmt: last BM on 09-09-13 Bladder mgmt:using urinal Diabetic mgmt - yes, managed with meds and insulin by pt's wife. Wife checks his sugars. Access: pt has porta cath  for IV access Pt wears glasses all the time  Note: pt prefers to have male caregivers for bathing. Charge Rn informed. Pt/Wife told that we will try to accommodate this preference but told that staffing needs are a fluid dynamic.    Previous Home Environment Living Arrangements: Spouse/significant other Available Help at Discharge: Family Type of Home: House Home Layout: Two level;Bed/bath upstairs Alternate Level Stairs-Rails: Right;Left Alternate Level Stairs-Number of Steps: flight, patient has lift chair Home Access: Stairs to enter Entrance Stairs-Rails: Right;Left Entrance Stairs-Number of Steps: 16 steps to enter the home Bathroom Shower/Tub: Walk-in shower;Door [employer] Toilet: Handicapped height Bathroom Accessibility: Yes How Accessible: Accessible via walker Home Care Services: No Additional Comments: Pt has a stair chair lift now for going upstairs.   Discharge Living Setting Plans for Discharge Living Setting: Patient's home Type of Home at Discharge: House Discharge Home Layout: Two level;Bed/bath upstairs Alternate Level Stairs-Number of Steps: flight (has a stair lift to reach 2nd floor bedroom) Discharge Home Access: Stairs to enter Entrance Stairs-Rails: None Entrance Stairs-Number of Steps: 1 (one step to back door porch, 16 steps w/ bil. rails front do) Does the patient have any problems obtaining your medications?: No  Social/Family/Support Systems Patient Roles: Spouse Contact Information: wife Donald [name] is primary  contact Anticipated Caregiver: wife Donald [name] Anticipated Ambulance person Information: see above Ability/Limitations of Caregiver: no limitations, wife has been caregiver before with pt Caregiver Availability: 24/7 Discharge Plan Discussed with Primary Caregiver: Yes (discussed with pt/wife/family on 5-13, on 5-15 and 5-18) Is Caregiver In Agreement with Plan?: Yes Does Caregiver/Family have Issues with Lodging/Transportation while Pt is in Rehab?: No  Goals/Additional Needs Patient/Family Goal for Rehab: Supervision and Mod Ind with PT/OT, NA for SLP Expected length of stay: 7-10 days Cultural Considerations: none Dietary Needs: carb modified, thin liquids Equipment Needs: to be determined Pt/Family Agrees to Admission and willing to participate: Yes (discussed with wife/pt on 09-09-13, 5-15 and 5-18) Program Orientation Provided & Reviewed with Pt/Caregiver Including Roles  & Responsibilities: Yes  Decrease burden of Care through IP rehab admission: NA  Possible need for SNF placement upon discharge: not anticipated  Patient Condition: This patient's medical and functional status has changed since the consult dated: 09-09-13 in which the Rehabilitation Physician determined and documented that the patient's condition is appropriate for intensive rehabilitative care in an inpatient rehabilitation facility. See "History of Present Illness" (above) for medical update. Functional changes are: minimal assist for sit to stand and transfers but no ambulation due to pain. Patient's medical and functional status update has been discussed with the Rehabilitation physician and patient remains appropriate for inpatient rehabilitation. Will admit to inpatient rehab today.  Preadmission Screen Completed By:  Ave Filter, PT 09/14/2013 2:36 PM ______________________________________________________________________   Discussed status with Dr. Naaman Plummer on 09-14-13 at 1446 and received telephone approval  for admission today.  Admission Coordinator:  Ave Filter, PT time 1446/Date 09-14-13

## 2013-09-14 NOTE — Progress Notes (Addendum)
  [facility] Progress Note  09/14/2013 7:56 AM 3 Days Post-Op  Subjective:  States he is having burning pain at his medial incision site; hasn't had pain med since 0330  Afebrile VSS 95% RA  Filed Vitals:   09/14/13 0435  BP: 113/37  Pulse: 71  Temp: 98 F (36.7 C)  Resp: 17   Physical Exam: Incisions:  Medial incision is c/d/i with sutures in place; lateral wound vac in place with good seal. Extremities:  Left foot with + doppler signal in the DP; weak peroneal doppler signal;    INR    Component Value Date/Time   INR 1.14 09/05/2013 1805    Intake/Output Summary (Last 24 hours) at 09/14/13 0756 Last data filed at 09/14/13 0339  Gross per 24 hour  Intake    360 ml  Output    450 ml  Net    -90 ml   Assessment:  68 y.o. male is s/p:  Closure of medial fasciotomy site  3 Day Post-Op  And  1. Thrombectomy of left femoropopliteal bypass graft  2. Bovine pericardial patch of distal bypass graft  3. Intraoperative arteriogram  4. 4 compartment fasciotomy  9 Days Post-Op  Plan: -for vac change today-Dr. Scot Dock to evaluate wound later this morning -accessing porta cath for IV access -continue mobilization and OOB to chair -DVT prophylaxis:  Lovenox  Leontine Locket, PA-C [facility] [phone] 09/14/2013 7:56 AM  VAC Removed. Remarkably the fasciotomy site looks reasonable. The erythema on the anterior aspect of the left leg has improved slightly. The muscle does not contract and our best hope is that this will scar in. The only other option would be an AKA, therefore I think I will give him the benefit of the doubt and we will continue the [facility] He could go to rehabilitation at any point from a vascular standpoint. We will continue to follow his wound.  Deitra Mayo, MD, [place] [phone] 09/14/2013

## 2013-09-14 NOTE — Consult Note (Signed)
WOC wound follow up Wound type: surgical wound Wound bed: improvement in the appearance in the wound bed, more beefy red muscle.  Drainage (amount, consistency, odor) serosanguinous in canister, moderate amounts.  Periwound: intact with some ecchymosis at the wound edges.  Dressing procedure/placement/frequency: 2pc of Mepitel used to protect the exposed muscle per manufacturer recommendations.  2pc of black granufoam used to cover the site, drape and sealed at 118mmHG.  Pt tolerated well with prior PO pain med administration.   Used dry 4x4s and kerlix to cover medial site that has been closed now and is oozing just a bit. Wrapped entire site with ACE.   Noted that the first and second toes with more discoloration today and noted now slight discoloration of the 3-5 toes and ecchymosis laterally at the 5th toe.    WOC will follow along with you for [facility] dressing assistance as needed. Genea Rheaume Saxton RN,CWOCN [phone]

## 2013-09-14 NOTE — Progress Notes (Signed)
Clinical Social Work Department BRIEF PSYCHOSOCIAL ASSESSMENT 09/14/2013  Patient:  Donald [name], Donald [name]     Account Number:  [id number]     Admit date:  12/08/2013  Clinical Social Worker:  Freeman Caldron  Date/Time:  09/14/2013 01:06 PM  Referred by:  Physician  Date Referred:  09/14/2013 Referred for  SNF Placement   Other Referral:   Interview type:  Patient Other interview type:   Also spoke with pt's wife in room    PSYCHOSOCIAL DATA Living Status:  FAMILY Admitted from facility:   Level of care:   Primary support name:  Donald [name] Primary support relationship to patient:  SPOUSE Degree of support available:   Good--pt lives with spouse    CURRENT CONCERNS Current Concerns  Post-Acute Placement   Other Concerns:    SOCIAL WORK ASSESSMENT / PLAN CSW consulted to do SNF back-up. [facility] evaluating pt for appropriateness to inpatient rehab. Pt states he has had [facility] home health in the past and would prefer to have this in place rather than go to SNF if [facility] does not accept him. RNCM informed. CSW signing off.   Assessment/plan status:  No Further Intervention Required Other assessment/ plan:   Information/referral to community resources:   Home health vs CIR    PATIENT'S/FAMILY'S RESPONSE TO PLAN OF CARE: Good--pt and wife engaged in conversation with CSW. Understanding of CSW role, but pt politely refused SNF referrals stating he would prefer to go home if [facility] cannot accept him. CSW signed off.       Ky Barban, MSW, [facility] Clinical Social Worker [phone]

## 2013-09-14 NOTE — Progress Notes (Signed)
Physical Medicine and Rehabilitation Consult  Reason for Consult: Left femoral below knee popliteal bypass/history CVA  Referring Physician: Dr. Scot Dock  HPI: Donald [name] is a 68 y.o. right-handed male with history of CVA left-sided weakness 2008 and received inpatient rehabilitation services, hypertension, diabetes mellitus peripheral neuropathy and peripheral vascular disease and multiple revascularization procedures. Patient independent with a straight cane prior to admission living with his wife. Admitted 09/05/2013 with ischemic changes left leg as well as recent fall. X-rays did not reveal any fracture. Noted to be significantly anemic 7.5 and refused transfusion. Findings of occluded graft underwent thrombectomy of left femoral-popliteal bypass graft as well as 4 compartment fasciotomy 09/05/2013 per Dr. Scot Dock. Postoperative pain management. Maintained on Aggrenox for history of CVA as well as subcutaneous Lovenox for DVT prophylaxis. Hemoglobin remained stable his latest hemoglobin 9.1. Hemoglobin A1c 7.7 with insulin therapy as directed. Wound care nurse followup for fasciotomy sites. Physical therapy evaluation completed 09/07/2013 with recommendations of physical medicine rehabilitation consult  Prior CIR stay 2008 for R MCA infarct  Review of Systems  Gastrointestinal: Positive for constipation.  Neurological: Positive for speech change and weakness.  Psychiatric/Behavioral: Positive for depression.  All other systems reviewed and are negative.   Past Medical History   Diagnosis  Date   .  GERD (gastroesophageal reflux disease)    .  Hyperlipidemia    .  Hypertension    .  Peripheral vascular disease    .  Internal and external bleeding hemorrhoids    .  History of gout      left great toe   .  Depression    .  Numbness in right leg      Hx: of diabetes   .  Coronary artery disease      RCA occlusion with good collaterals, o/w no obs CAD 12/2011   .  Pneumonia  ~ 1977    .  Type II diabetes mellitus    .  History of blood transfusion  12/2011; 12/2012     "before OR; after OR" (01/22/2013)   .  Stroke  2008     "left side weak; unable to move left hand still" (01/22/2013)   .  Rectal cancer  dx'd 08/2007     S/P chemo, radiation, biopsies   .  Metastases to the liver  dx'd 08/2010    Past Surgical History   Procedure  Laterality  Date   .  Carotid endarterectomy  Right  2008   .  Liver cryoablation   08/2010     "chemo shrunk the cancer 50% then they went in and burned the rest of it"   .  Axillary-femoral bypass graft   01/29/2012     Procedure: BYPASS GRAFT AXILLA-BIFEMORAL; Surgeon: Elam Dutch, MD; Location: [facility] OR; Service: Vascular; Laterality: N/A; Left axilla-bifemoral bypass using gore-tex graft.   .  Colonoscopy w/ biopsies and polypectomy       Hx: of   .  Femoral-popliteal bypass graft  Left  01/06/2013     Procedure: Femoral-Popliteal Artery Bypass; Surgeon: Elam Dutch, MD; Location: [facility]; Service: Vascular; Laterality: Left;   .  Intraoperative arteriogram  Left  01/06/2013     Procedure: INTRA OPERATIVE ARTERIOGRAM; Surgeon: Elam Dutch, MD; Location: [place]; Service: Vascular; Laterality: Left;   .  Femoral-popliteal bypass graft  Left  01/10/2013     Procedure: THROMBECTOMY POSSIBLE BYPASS GRAFT FEMORAL-POPLITEAL ARTERY; Surgeon: Mal Misty, MD; Location: [place]; Service:  Vascular; Laterality: Left; Attempted Thrombectomy of left Femoral/Popliteal graft - Unsuccessful. Insertion of Left femoral to below the knee popliteal propaten graft.   .  Mass biopsy   2009     "found mass that was cancer; later had chemo and radiation; never cut mass out" (01/22/2013)   .  Femoral-popliteal bypass graft  Left  09/05/2013     Procedure: Thrombectomy of Left Femoral-Below Knee Popliteal Bypass Graft; Patch Angioplasty of Distal Portion of Bypass Graft; Surgeon: Angelia Mould, MD; Location: [place]; Service: Vascular; Laterality: Left;   .   Patch angioplasty  Left  09/05/2013     Procedure: Patch Angioplasty Distal Portion of Left Femoral-Below Knee Popliteal Bypass Graft; Surgeon: Angelia Mould, MD; Location: [place]; Service: Vascular; Laterality: Left;   .  Intraoperative arteriogram  Left  09/05/2013     Procedure: INTRA OPERATIVE ARTERIOGRAM; Surgeon: Angelia Mould, MD; Location: [place]; Service: Vascular; Laterality: Left;   .  Fasciotomy  Left  09/05/2013     Procedure: FASCIOTOMY; Surgeon: Angelia Mould, MD; Location: [facility] OR; Service: Vascular; Laterality: Left;    Family History   Problem  Relation  Age of Onset   .  Cancer - Other  Sister     Social History: reports that he has quit smoking. His smoking use included Cigarettes. He has a 5.7 pack-year smoking history. He has never used smokeless tobacco. He reports that he does not drink alcohol or use illicit drugs.  Allergies:  Allergies   Allergen  Reactions   .  Other      Became hypotensive. Pt states allergic to "muscle relaxer" but unsure of name.   .  Tape  Hives and Itching     medfix non woven tape    Medications Prior to Admission   Medication  Sig  Dispense  Refill   .  Ascorbic Acid (VITAMIN C) 1000 MG tablet  Take 500 mg by mouth daily.     Marland Kitchen  dipyridamole-aspirin (AGGRENOX) 200-25 MG per 12 hr capsule  Take 1 capsule by mouth 2 (two) times daily.     Marland Kitchen  docusate sodium (COLACE) 100 MG capsule  Take 200 mg by mouth 2 (two) times daily as needed for mild constipation.     .  gabapentin (NEURONTIN) 300 MG capsule  Take 1 capsule (300 mg total) by mouth 3 (three) times daily.  90 capsule  6   .  insulin detemir (LEVEMIR) 100 UNIT/ML injection  Inject 10 Units into the skin daily.     Marland Kitchen  lisinopril (PRINIVIL,ZESTRIL) 5 MG tablet  Take 2.5 mg by mouth daily.     .  mesalamine (CANASA) 1000 MG suppository  Place 1 suppository (1,000 mg total) rectally at bedtime.  15 suppository  2   .  metFORMIN (GLUCOPHAGE) 500 MG tablet  Take 500 mg by  mouth 2 (two) times daily with a meal.     .  omega-3 acid ethyl esters (LOVAZA) 1 G capsule  Take 2 g by mouth 2 (two) times daily.     Marland Kitchen  omeprazole (PRILOSEC) 40 MG capsule  Take 40 mg by mouth daily.     Marland Kitchen  oxyCODONE-acetaminophen (PERCOCET) 5-325 MG per tablet  Take 1 tablet by mouth every 4 (four) hours as needed.  20 tablet  0   .  Probiotic Product (ALIGN PO)  Take 1 tablet by mouth at bedtime.      Home:  Home Living  Family/patient  expects to be discharged to:: Private residence  Living Arrangements: Spouse/significant other  Available Help at Discharge: Family  Type of Home: House  Home Access: Stairs to enter  [employer] of Steps: 16 steps to enter the home  Entrance Stairs-Rails: Right;Left  Home Layout: Two level;Bed/bath upstairs  Alternate Level Stairs-Number of Steps: flight, patient has lift chair  Alternate Level Stairs-Rails: Right;Left  Home Equipment: Walker - 2 wheels;Cane - single point;Bedside commode;Shower seat;Hand held shower head;Grab bars - tub/shower  Additional Comments: Pt has a stair chair lift now for going upstairs.  Functional History:  Prior Function  Level of Independence: Independent with assistive device(s)  Comments: used cane PTA, has been using RW since recent admit for fempop BPG but wants to return to cane  Functional Status:  Mobility:  Bed Mobility  Overal bed mobility: Needs Assistance  Bed Mobility: Sit to Supine  Supine to sit: Min assist  Sit to supine: Min assist  General bed mobility comments: (A) to left BIL LE into bed  Transfers  Overall transfer level: Needs assistance  Equipment used: Straight cane  Transfers: Stand Pivot Transfers  Sit to Stand: Min assist;+2 physical assistance;+2 safety/equipment  Stand pivot transfers: Min assist;+2 physical assistance  General transfer comment: Pt transfering to the left with cane  Ambulation/Gait  General Gait Details: unable to tolerate at this time   ADL:  ADL   Overall ADL's : Needs assistance/impaired  Eating/Feeding: Set up;Sitting  Toilet Transfer: +2 for physical assistance;Minimal assistance;Stand-pivot  Functional mobility during ADLs: Minimal assistance;+2 for physical assistance  General ADL Comments: Pt reports extreme pain first attempt. PT arriving prior to OT return and pt repositioned in chair. OT returning and pt declined OT at that time due to pain. OT returning and pt agreeable to mobility back to bed. Pt demonstrates use of cane and stand pivot to bed. Pt weight bearing on LT LE this attempt.  Cognition:  Cognition  Overall Cognitive Status: Within Functional Limits for tasks assessed  Orientation Level: Oriented X4  Cognition  Arousal/Alertness: Awake/alert  Behavior During Therapy: WFL for tasks assessed/performed  Overall Cognitive Status: Within Functional Limits for tasks assessed  Blood pressure 124/66, pulse 83, temperature 98.5 F (36.9 C), temperature source Oral, resp. rate 19, height 5\' 9"  (1.753 m), weight 78.2 kg (172 lb 6.4 oz), SpO2 100.00%.  Physical Exam  Vitals reviewed.  HENT:  Head: Normocephalic.  Eyes: EOM are normal.  Neck: Normal range of motion. Neck supple. No thyromegaly present.  Cardiovascular: Normal rate and regular rhythm.  Respiratory: Effort normal and breath sounds normal. No respiratory distress.  GI: Soft. Bowel sounds are normal. He exhibits no distension.  Musculoskeletal:  Left-sided weakness from history of CVA  Neurological: He is alert.  Speech is dysarthric but intelligible. He is oriented x3 and follows basic commands  Skin:  Wound VAC with dressing in place left lower extremity  motor strength is 5/5 in the right deltoid, bicep, tricep, grip, hip flexors, knee extensors, ankle dorsiflexion  3 minus in the left deltoid, bicep, tricep, and grip with increased tone in the finger and wrist flexors Ashworth grade 2-3  2 minus in the left ankle dorsiflexor 3 minus in the knee  extensor and hip flexor.  Decreased sensation left hand  Visual fields intact confrontational testing  Results for orders placed during the hospital encounter of 09/05/13 (from the past 24 hour(s))   GLUCOSE, CAPILLARY Status: Abnormal    Collection Time    09/07/13 4:18  PM   Result  Value  Ref Range    Glucose-Capillary  136 (*)  70 - 99 mg/dL    Comment 1  Notify RN     Comment 2  Documented in Chart    GLUCOSE, CAPILLARY Status: Abnormal    Collection Time    09/07/13 8:38 PM   Result  Value  Ref Range    Glucose-Capillary  107 (*)  70 - 99 mg/dL    Comment 1  Documented in Chart     Comment 2  Notify RN    GLUCOSE, CAPILLARY Status: Abnormal    Collection Time    09/08/13 6:04 AM   Result  Value  Ref Range    Glucose-Capillary  100 (*)  70 - 99 mg/dL    Comment 1  Documented in Chart     Comment 2  Notify RN    GLUCOSE, CAPILLARY Status: Abnormal    Collection Time    09/08/13 7:42 AM   Result  Value  Ref Range    Glucose-Capillary  116 (*)  70 - 99 mg/dL   GLUCOSE, CAPILLARY Status: Abnormal    Collection Time    09/08/13 11:42 AM   Result  Value  Ref Range    Glucose-Capillary  106 (*)  70 - 99 mg/dL    Comment 1  Notify RN     Comment 2  Documented in Chart     No results found.  Assessment/Plan:  Diagnosis: Deconditioning after femoropopliteal bypass as well as fasciotomy requiring wound vac in a patient with chronic left hemiparesis  1. Does the need for close, 24 hr/day medical supervision in concert with the patient's rehab needs make it unreasonable for this patient to be served in a less intensive setting? Yes 2. Co-Morbidities requiring supervision/potential complications: Peripheral vascular disease, diabetes 3. Due to bladder management, bowel management, safety, skin/wound care, disease management, medication administration, pain management and patient education, does the patient require 24 hr/day rehab nursing? Yes 4. Does the patient require coordinated  care of a physician, rehab nurse, PT (1-2 hrs/day, 5 days/week) and OT (1-2 hrs/day, 5 days/week) to address physical and functional deficits in the context of the above medical diagnosis(es)? Potentially Addressing deficits in the following areas: balance, endurance, locomotion, strength, transferring, bowel/bladder control, bathing, dressing and toileting 5. Can the patient actively participate in an intensive therapy program of at least 3 hrs of therapy per day at least 5 days per week? Yes 6. The potential for patient to make measurable gains while on inpatient rehab is good 7. Anticipated functional outcomes upon discharge from inpatient rehab are modified independent with PT, modified independent with OT, n/a with SLP. 8. Estimated rehab length of stay to reach the above functional goals is: 7-10 days 9. Does the patient have adequate social supports to accommodate these discharge functional goals? Potentially 10. Anticipated D/C setting: Home 11. Anticipated post D/C treatments: [place] therapy 12. Overall Rehab/Functional Prognosis: good RECOMMENDATIONS:  This patient's condition is appropriate for continued rehabilitative care in the following setting: CIR  Patient has agreed to participate in recommended program. Potentially  Note that insurance prior authorization may be required for reimbursement for recommended care.  Comment: Patient states that wife can assist him at home  Revision History...      Date/Time User Action    09/09/2013 9:48 AM Charlett Blake, MD Sign    09/08/2013 3:06 PM Cathlyn Parsons, PA-C [place]   View Details Report  Routing History...      Date/Time From To Method    09/09/2013 9:48 AM Charlett Blake, MD Charlett Blake, MD In Basket    09/09/2013 9:48 AM Charlett Blake, MD Iona Beard, MD Fax

## 2013-09-15 ENCOUNTER — Inpatient Hospital Stay (HOSPITAL_COMMUNITY): Payer: Medicare Other

## 2013-09-15 ENCOUNTER — Encounter (HOSPITAL_COMMUNITY): Payer: Self-pay | Admitting: Vascular Surgery

## 2013-09-15 ENCOUNTER — Inpatient Hospital Stay (HOSPITAL_COMMUNITY): Payer: Medicare Other | Admitting: Occupational Therapy

## 2013-09-15 DIAGNOSIS — I739 Peripheral vascular disease, unspecified: Secondary | ICD-10-CM

## 2013-09-15 DIAGNOSIS — I999 Unspecified disorder of circulatory system: Secondary | ICD-10-CM

## 2013-09-15 DIAGNOSIS — R5381 Other malaise: Secondary | ICD-10-CM

## 2013-09-15 LAB — COMPREHENSIVE METABOLIC PANEL
ALBUMIN: 2.5 g/dL — AB (ref 3.5–5.2)
ALT: 7 U/L (ref 0–53)
AST: 26 U/L (ref 0–37)
Alkaline Phosphatase: 77 U/L (ref 39–117)
BILIRUBIN TOTAL: 0.2 mg/dL — AB (ref 0.3–1.2)
BUN: 14 mg/dL (ref 6–23)
CHLORIDE: 103 meq/L (ref 96–112)
CO2: 24 meq/L (ref 19–32)
CREATININE: 1.03 mg/dL (ref 0.50–1.35)
Calcium: 8.8 mg/dL (ref 8.4–10.5)
GFR calc Af Amer: 84 mL/min — ABNORMAL LOW (ref >90)
GFR calc non Af Amer: 72 mL/min — ABNORMAL LOW (ref >90)
Glucose, Bld: 115 mg/dL — ABNORMAL HIGH (ref 70–99)
Potassium: 4.4 mEq/L (ref 3.7–5.3)
Sodium: 140 mEq/L (ref 137–147)
Total Protein: 6.3 g/dL (ref 6.0–8.3)

## 2013-09-15 LAB — CBC WITH DIFFERENTIAL/PLATELET
BASOS ABS: 0 10*3/uL (ref 0.0–0.1)
Basophils Relative: 1 % (ref 0–1)
Eosinophils Absolute: 0.2 10*3/uL (ref 0.0–0.7)
Eosinophils Relative: 4 % (ref 0–5)
HCT: 26.5 % — ABNORMAL LOW (ref 39.0–52.0)
HEMOGLOBIN: 7.9 g/dL — AB (ref 13.0–17.0)
LYMPHS PCT: 21 % (ref 12–46)
Lymphs Abs: 1.4 10*3/uL (ref 0.7–4.0)
MCH: 23 pg — ABNORMAL LOW (ref 26.0–34.0)
MCHC: 29.8 g/dL — ABNORMAL LOW (ref 30.0–36.0)
MCV: 77.3 fL — ABNORMAL LOW (ref 78.0–100.0)
MONO ABS: 0.7 10*3/uL (ref 0.1–1.0)
Monocytes Relative: 11 % (ref 3–12)
NEUTROS ABS: 4.4 10*3/uL (ref 1.7–7.7)
NEUTROS PCT: 65 % (ref 43–77)
Platelets: 418 10*3/uL — ABNORMAL HIGH (ref 150–400)
RBC: 3.43 MIL/uL — ABNORMAL LOW (ref 4.22–5.81)
RDW: 20.7 % — ABNORMAL HIGH (ref 11.5–15.5)
WBC: 6.9 10*3/uL (ref 4.0–10.5)

## 2013-09-15 LAB — GLUCOSE, CAPILLARY
GLUCOSE-CAPILLARY: 130 mg/dL — AB (ref 70–99)
GLUCOSE-CAPILLARY: 139 mg/dL — AB (ref 70–99)
Glucose-Capillary: 146 mg/dL — ABNORMAL HIGH (ref 70–99)
Glucose-Capillary: 130 mg/dL — ABNORMAL HIGH (ref 70–99)

## 2013-09-15 MED ORDER — SODIUM CHLORIDE 0.9 % IJ SOLN
10.0000 mL | INTRAMUSCULAR | Status: DC | PRN
  Administered 2013-09-15 – 2013-09-22 (×11): 10 mL
  Administered 2013-09-23: 20 mL
  Administered 2013-09-23 – 2013-09-24 (×4): 10 mL

## 2013-09-15 NOTE — Discharge Summary (Signed)
Agree with plans for transfer to rehabilitation.  Deitra Mayo, MD, [place] [phone] 09/15/2013

## 2013-09-15 NOTE — Evaluation (Signed)
Physical Therapy Assessment and Plan  Patient Details  Name: Donald [name] MRN: [id number] Date of Birth: April 06, 1946  PT Diagnosis: Abnormality of gait, Difficulty walking, Edema, Hemiparesis non-dominant, Impaired sensation, Muscle spasms, Muscle weakness and Pain in LLE Rehab Potential: Good ELOS: 10-12 days   Today's Date: 09/15/2013 Time: 8185-6314 Time Calculation (min): 60 min  Problem List:  Patient Active Problem List   Diagnosis Date Noted  . Ischemic leg 09/05/2013  . Wound discharge 04/09/2013  . PVD (peripheral vascular disease) with claudication 01/14/2013  . Hemiparesis affecting left side as late effect of stroke 01/05/2013  . Abnormality of gait 09/03/2012  . Other acquired deformity of ankle and foot(736.79) 09/03/2012  . Aftercare following surgery of the circulatory system, [place] 05/15/2012  . 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 Peripheral vascular disease, unspecified 03/06/2012  . PVD (peripheral vascular disease) 02/21/2012  . Occlusion and stenosis of carotid artery without mention of cerebral infarction 02/21/2012  . Physical deconditioning 01/31/2012  . Atherosclerotic peripheral vascular disease with rest pain 01/24/2012  . Preoperative evaluation to rule out surgical contraindication 01/24/2012  . Anal carcinoma 10/30/2011  . Anal cancer 02/26/2011  . [place] MALIGNANT NEOPLASM OF LARGE INTESTINE&RECTUM 11/09/2008  . NAUSEA AND VOMITING 11/09/2008  . CONSTIPATION 08/18/2008  . RECTAL BLEEDING 08/18/2008  . ABDOMINAL PAIN -GENERALIZED 08/18/2008  . DM 08/17/2008  . HYPERLIPIDEMIA 08/17/2008  . HEMORRHOIDS 08/17/2008  . CONSTIPATION, CHRONIC 08/17/2008  . CEREBROVASCULAR ACCIDENT, HX OF 08/17/2008    Past Medical History:  Past Medical History  Diagnosis Date  . GERD (gastroesophageal reflux disease)   . Hyperlipidemia   . Hypertension   . Peripheral vascular disease   . Internal and  external bleeding hemorrhoids   . History of gout     left great toe  . Depression   . Numbness in right leg     Hx: of diabetes  . Coronary artery disease     RCA occlusion with good collaterals, o/w no obs CAD 12/2011  . Pneumonia ~ 1977  . Type II diabetes mellitus   . History of blood transfusion 12/2011; 12/2012    "before OR; after OR" (01/22/2013)  . Stroke 2008    "left side weak; unable to move left hand still" (01/22/2013)  . Rectal cancer dx'd 08/2007    S/P chemo, radiation, biopsies  . Metastases to the liver dx'd 08/2010   Past Surgical History:  Past Surgical History  Procedure Laterality Date  . Carotid endarterectomy Right 2008  . Liver cryoablation  08/2010    "chemo shrunk the cancer 50% then they went in and burned the rest of it"  . Axillary-femoral bypass graft  01/29/2012    Procedure: BYPASS GRAFT AXILLA-BIFEMORAL;  Surgeon: Elam Dutch, MD;  [facility] OR;  Service: Vascular;  Laterality: N/A;  Left axilla-bifemoral bypass using gore-tex graft.   . Colonoscopy w/ biopsies and polypectomy      Hx: of  . Femoral-popliteal bypass graft Left 01/06/2013    Procedure: Femoral-Popliteal Artery Bypass;  Surgeon: Elam Dutch, MD;  Location: [place];  Service: Vascular;  Laterality: Left;  . Intraoperative arteriogram Left 01/06/2013    Procedure: INTRA OPERATIVE ARTERIOGRAM;  Surgeon: Elam Dutch, MD;  Location: [place];  Service: Vascular;  Laterality: Left;  . Femoral-popliteal bypass graft Left 01/10/2013    Procedure: THROMBECTOMY POSSIBLE BYPASS GRAFT FEMORAL-POPLITEAL ARTERY;  Surgeon: Mal Misty, MD;  Location: [place];  Service: Vascular;  Laterality: Left;  Attempted Thrombectomy of left Femoral/Popliteal graft - Unsuccessful.  Insertion of Left femoral to below the knee popliteal propaten graft.  . Mass biopsy  2009    "found mass that was cancer; later had chemo and radiation; never cut mass out" (01/22/2013)  . Femoral-popliteal bypass graft Left  09/05/2013    Procedure: Thrombectomy of Left Femoral-Below Knee Popliteal Bypass Graft; Patch Angioplasty of Distal Portion of Bypass Graft;  Surgeon: Angelia Mould, MD;  Location: [place];  Service: Vascular;  Laterality: Left;  . Patch angioplasty Left 09/05/2013    Procedure: Patch Angioplasty Distal Portion of Left Femoral-Below Knee Popliteal Bypass Graft;  Surgeon: Angelia Mould, MD;  Location: [place];  Service: Vascular;  Laterality: Left;  . Intraoperative arteriogram Left 09/05/2013    Procedure: INTRA OPERATIVE ARTERIOGRAM;  Surgeon: Angelia Mould, MD;  Location: [place];  Service: Vascular;  Laterality: Left;  . Fasciotomy Left 09/05/2013    Procedure: FASCIOTOMY;  Surgeon: Angelia Mould, MD;  Location: [place];  Service: Vascular;  Laterality: Left;  Mar[facility] Vascular surgery      Assessment & Plan Clinical Impression: Patient is a 68 y.o. year old male with recent admission to the hospital with history of CVA with left-sided weakness, HTN, DM type 2, PVD with multiple revascularization procedures (CIR stay 2014) and chronic infection left groin. He was admitted 09/05/2013 with ischemic changes LLE as well as recent fall. X rays of LLE without fracture and patient with significant anemia with Hgb 7.5 but refused transfusion. He was taken to OR for thrombectomy Left FPBG with bovine patch of distal bypass graft as well as 4 compartment fasciotomy by Dr. Scot Dock. VAC placed on two sites to help promote wound healing. Was taken to OR on 09/11/13 for closure of medial fasciotomy site and it was noted that lateral and anterior compartments did not appear viable---may need AKA if infection develops. Today has developed ischemic changes left 1st and 2nd toes with demarcating of 3rd, 4th and 5th toes. Therapy ongoing and patient limited by balance, pain as well as endurance deficits. Patient transferred to CIR on 09/14/2013 .   Patient currently requires min A for basic transfers and +2  for gait/stairs (for safety and due to management of wound vac) with mobility secondary to muscle weakness, muscle joint tightness and muscle paralysis, decreased cardiorespiratoy endurance,   and decreased sitting balance, decreased standing balance, decreased postural control, hemiplegia and decreased balance strategies.  Prior to hospitalization, patient was modified independent  with mobility and lived with Spouse in a House home.  Home access is 16 steps in the front; 1 in the back (usually enter in the back)Stairs to enter.  Patient will benefit from skilled PT intervention to maximize safe functional mobility, minimize fall risk and decrease caregiver burden for planned discharge home with 24 hour supervision.  Anticipate patient will benefit from follow up [place] at discharge.  PT - End of Session Activity Tolerance: Decreased this session Endurance Deficit: Yes PT Assessment Rehab Potential: Good PT Patient demonstrates impairments in the following area(s): Balance;Edema;Endurance;Motor;Pain;Safety;Sensory;Skin Integrity PT Transfers Functional Problem(s): Bed Mobility;Bed to Chair;Car;Furniture PT Locomotion Functional Problem(s): Ambulation;Wheelchair Mobility;Stairs PT Plan PT Intensity: Minimum of 1-2 x/day ,45 to 90 minutes PT Frequency: 5 out of 7 days PT Duration Estimated Length of Stay: 10-12 days PT Treatment/Interventions: Ambulation/gait training;Balance/vestibular training;Community reintegration;Discharge planning;Disease management/prevention;DME/adaptive equipment instruction;Functional mobility training;Neuromuscular re-education;Pain management;Patient/family education;Skin care/wound management;Psychosocial support;Splinting/orthotics;Stair training;Therapeutic Activities;Therapeutic Exercise;UE/LE Strength taining/ROM;UE/LE Coordination activities;Wheelchair propulsion/positioning PT Transfers  Anticipated Outcome(s): S overall PT Locomotion Anticipated Outcome(s): S  household gait distances; mod I w/c mobility PT Recommendation Follow Up Recommendations: Home health PT Patient destination: Home Equipment Recommended: To be determined Equipment Details: Pt already owns RW, [facility], and hospital bed. May need w/c at d/c.  Skilled Therapeutic Intervention Individual treatment initiated with focus on functional sit to stands for balance and standing tolerance to don/pull up pants, basic transfers using SPC, gait trial with SPC (pt only able to gait ~ 4' due to fatigue in RLE and pain in LLE), stair negotiation in preparation for home entry using R hand rail (cues for which foot to lead with), education on edema control for LLE, and recommendation for pt's wife to bring in his walker with hand splint for gait trial in future session. Pt's goal is to use the [facility] but recommended trial of RW due to increased pain with WB on LLE causing decreased balance. Pt in agreement.   PT Evaluation Precautions/Restrictions Precautions Precautions: Fall Precaution Comments: wound vac Restrictions Weight Bearing Restrictions: No Home Living/Prior Functioning Home Living Available Help at Discharge: Family Type of Home: House Home Access: Stairs to enter [employer] of Steps: 16 steps in the front; 1 in the back (usually enter in the back) Home Layout: Two level;Bed/bath upstairs Alternate Level Stairs-Number of Steps: flight, patient has lift chair Alternate Level Stairs-Rails: Right;Left  Lives With: Spouse Prior Function Level of Independence: Requires assistive device for independence Comments: used cane PTA Cognition Overall Cognitive Status: Within Functional Limits for tasks assessed Safety/Judgment: Appears intact Sensation Sensation Light Touch:  (denies numbess/tingling RLE; LLE limited due to ACE wrap) Proprioception: Appears Intact Coordination Gross Motor Movements are Fluid and Coordinated: Yes Motor  Motor Motor: Hemiplegia (L sided from  h/o CVA (UE > LE); tremors noted in LUE)  Locomotion  Ambulation Ambulation/Gait Assistance: 1: +2 Total assist  Trunk/Postural Assessment  Cervical Assessment Cervical Assessment: Within Functional Limits Thoracic Assessment Thoracic Assessment: Exceptions to [facility] (kyphotic posture seated; lateral lean to R in standing) Lumbar Assessment Lumbar Assessment: Exceptions to [facility] (posterior tilt) Postural Control Postural Control: Deficits on evaluation  Balance Balance Balance Assessed: Yes Static Sitting Balance Static Sitting - Level of Assistance: 6: Modified independent (Device/Increase time) Dynamic Sitting Balance Dynamic Sitting - Level of Assistance: 5: Stand by assistance Static Standing Balance Static Standing - Level of Assistance: 4: Min assist Dynamic Standing Balance Dynamic Standing - Level of Assistance: 4: Min assist;3: Mod assist Extremity Assessment      RLE Assessment RLE Assessment:  (grossly 4/5) LLE Assessment LLE Assessment: Exceptions to [facility] LLE Strength LLE Overall Strength Comments: limited due to pain and increased edema; 3-/5 at hip and knee; minimal active movement at ankle noted. Pt reports minimal residual affects from prior CVA affecting LLE but due to pain, limited movement now  FIM:  FIM - Banker Devices: [place];Arm rests Bed/Chair Transfer: 4: Bed > Chair or W/C: Min A (steadying Pt. > 75%) FIM - Locomotion: Ambulation Locomotion: Ambulation Assistive Devices: Cane - Straight Ambulation/Gait Assistance: 1: +2 Total assist Locomotion: Ambulation: 1: Travels less than 50 ft with minimal assistance (Pt.>75%) (+2 for w/c follow and manage wound vac) FIM - Locomotion: Stairs Locomotion: Building control surveyor: Hand rail - 2 Locomotion: Stairs: 1: Two helpers (+2 for safety and manage wound vac)   Refer to Care Plan for Long Term Goals  Recommendations for other services: None  Discharge Criteria:  Patient will be discharged from PT  if patient refuses treatment 3 consecutive times without medical reason, if treatment goals not met, if there is a change in medical status, if patient makes no progress towards goals or if patient is discharged from hospital.  The above assessment, treatment plan, treatment alternatives and goals were discussed and mutually agreed upon: by patient  Lars Masson 09/15/2013, 9:49 AM

## 2013-09-15 NOTE — Evaluation (Signed)
Occupational Therapy Assessment and Plan & Session Note  Patient Details  Name: Donald [name] MRN: [id number] Date of Birth: 1946-04-15  OT Diagnosis: acute pain and muscle weakness (generalized) Rehab Potential: Rehab Potential: Good ELOS: ~10 days   Today's Date: 09/15/2013 Time: 1030-1130 Time Calculation (min): 60 min  Problem List:  Patient Active Problem List   Diagnosis Date Noted  . Ischemic leg 09/05/2013  . Wound discharge 04/09/2013  . PVD (peripheral vascular disease) with claudication 01/14/2013  . Hemiparesis affecting left side as late effect of stroke 01/05/2013  . Abnormality of gait 09/03/2012  . Other acquired deformity of ankle and foot(736.79) 09/03/2012  . Aftercare following surgery of the circulatory system, August 05/15/2012  . 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 Peripheral vascular disease, unspecified 03/06/2012  . PVD (peripheral vascular disease) 02/21/2012  . Occlusion and stenosis of carotid artery without mention of cerebral infarction 02/21/2012  . Physical deconditioning 01/31/2012  . Atherosclerotic peripheral vascular disease with rest pain 01/24/2012  . Preoperative evaluation to rule out surgical contraindication 01/24/2012  . Anal carcinoma 10/30/2011  . Anal cancer 02/26/2011  . [place] MALIGNANT NEOPLASM OF LARGE INTESTINE&RECTUM 11/09/2008  . NAUSEA AND VOMITING 11/09/2008  . CONSTIPATION 08/18/2008  . RECTAL BLEEDING 08/18/2008  . ABDOMINAL PAIN -GENERALIZED 08/18/2008  . DM 08/17/2008  . HYPERLIPIDEMIA 08/17/2008  . HEMORRHOIDS 08/17/2008  . CONSTIPATION, CHRONIC 08/17/2008  . CEREBROVASCULAR ACCIDENT, HX OF 08/17/2008    Past Medical History:  Past Medical History  Diagnosis Date  . GERD (gastroesophageal reflux disease)   . Hyperlipidemia   . Hypertension   . Peripheral vascular disease   . Internal and external bleeding hemorrhoids   . History of gout     left great  toe  . Depression   . Numbness in right leg     Hx: of diabetes  . Coronary artery disease     RCA occlusion with good collaterals, o/w no obs CAD 12/2011  . Pneumonia ~ 1977  . Type II diabetes mellitus   . History of blood transfusion 12/2011; 12/2012    "before OR; after OR" (01/22/2013)  . Stroke 2008    "left side weak; unable to move left hand still" (01/22/2013)  . Rectal cancer dx'd 08/2007    S/P chemo, radiation, biopsies  . Metastases to the liver dx'd 08/2010   Past Surgical History:  Past Surgical History  Procedure Laterality Date  . Carotid endarterectomy Right 2008  . Liver cryoablation  08/2010    "chemo shrunk the cancer 50% then they went in and burned the rest of it"  . Axillary-femoral bypass graft  01/29/2012    Procedure: BYPASS GRAFT AXILLA-BIFEMORAL;  Surgeon: Elam Dutch, MD;  Location: [facility] OR;  Service: Vascular;  Laterality: N/A;  Left axilla-bifemoral bypass using gore-tex graft.   . Colonoscopy w/ biopsies and polypectomy      Hx: of  . Femoral-popliteal bypass graft Left 01/06/2013    Procedure: Femoral-Popliteal Artery Bypass;  Surgeon: Elam Dutch, MD;  Location: [place];  Service: Vascular;  Laterality: Left;  . Intraoperative arteriogram Left 01/06/2013    Procedure: INTRA OPERATIVE ARTERIOGRAM;  Surgeon: Elam Dutch, MD;  Location: [place];  Service: Vascular;  Laterality: Left;  . Femoral-popliteal bypass graft Left 01/10/2013    Procedure: THROMBECTOMY POSSIBLE BYPASS GRAFT FEMORAL-POPLITEAL ARTERY;  Surgeon: Mal Misty, MD;  Location: [place];  Service: Vascular;  Laterality: Left;  Attempted Thrombectomy of left  Femoral/Popliteal graft - Unsuccessful.  Insertion of Left femoral to below the knee popliteal propaten graft.  . Mass biopsy  2009    "found mass that was cancer; later had chemo and radiation; never cut mass out" (01/22/2013)  . Femoral-popliteal bypass graft Left 09/05/2013    Procedure: Thrombectomy of Left Femoral-Below Knee Popliteal  Bypass Graft; Patch Angioplasty of Distal Portion of Bypass Graft;  Surgeon: Angelia Mould, MD;  Location: [place];  Service: Vascular;  Laterality: Left;  . Patch angioplasty Left 09/05/2013    Procedure: Patch Angioplasty Distal Portion of Left Femoral-Below Knee Popliteal Bypass Graft;  Surgeon: Angelia Mould, MD;  Location: [place];  Service: Vascular;  Laterality: Left;  . Intraoperative arteriogram Left 09/05/2013    Procedure: INTRA OPERATIVE ARTERIOGRAM;  Surgeon: Angelia Mould, MD;  Location: [place];  Service: Vascular;  Laterality: Left;  . Fasciotomy Left 09/05/2013    Procedure: FASCIOTOMY;  Surgeon: Angelia Mould, MD;  Location: [place];  Service: Vascular;  Laterality: Left;  Marland Kitchen Vascular surgery    . Fasciotomy closure Left 09/11/2013    Procedure: CLOSURE OF FASCIOTOMY SITES LEFT LOWER EXTREMITY AND WOUND VAC APPLICATION;  Surgeon: Angelia Mould, MD;  Location: [place];  Service: Vascular;  Laterality: Left;    Clinical Impression: Donald [name] is a 68 y.o. right-handed male with history of CVA with left-sided weakness, HTN, DM type 2, PVD with multiple revascularization procedures (CIR stay 2014) and chronic infection left groin. He was admitted 09/05/2013 with ischemic changes LLE as well as recent fall. X rays of LLE without fracture and patient with significant anemia with Hgb 7.5 but refused transfusion. He was taken to OR for thrombectomy Left FPBG with bovine patch of distal bypass graft as well as 4 compartment fasciotomy by Dr. Scot Dock. VAC placed on two sites to help promote wound healing. Was taken to OR on 09/11/13 for closure of medial fasciotomy site and it was noted that lateral and anterior compartments did not appear viable---may need AKA if infection develops. Today has developed ischemic changes left 1st and 2nd toes with demarcating of 3rd, 4th and 5th toes. Therapy ongoing and patient limited by balance, pain as well as endurance deficits.  CIR recommended by rehab team and patient admitted today. Patient transferred to CIR on 09/14/2013 .    Patient currently requires min>total assist with basic self-care skills secondary to muscle weakness and muscle joint tightness and decreased standing balance, decreased postural control, hemiplegia and decreased balance strategies.  Prior to hospitalization, patient could complete ADLs at a supervision>min assist level.   Patient will benefit from skilled intervention to increase independence with basic self-care skills prior to discharge home with care partner.  Anticipate patient will require 24 hour supervision and follow up home health.  OT - End of Session Activity Tolerance: Tolerates 10 - 20 min activity with multiple rests Endurance Deficit: Yes OT Assessment Rehab Potential: Good Barriers to Discharge:  (none known) OT Patient demonstrates impairments in the following area(s): Balance;Endurance;Motor;Pain;Perception;Safety;Sensory;Skin Integrity OT Basic ADL's Functional Problem(s): Grooming;Bathing;Dressing;Toileting OT Advanced ADL's Functional Problem(s):  (n/a) OT Transfers Functional Problem(s): Toilet;Tub/Shower OT Additional Impairment(s): None OT Plan OT Intensity: Minimum of 1-2 x/day, 45 to 90 minutes OT Frequency: 5 out of 7 days OT Duration/Estimated Length of Stay: ~10 days OT Treatment/Interventions: Balance/vestibular training;Community reintegration;Discharge planning;DME/adaptive equipment instruction;Functional mobility training;Pain management;Patient/family education;Psychosocial support;Self Care/advanced ADL retraining;Skin care/wound managment;Splinting/orthotics;Therapeutic Activities;Therapeutic Exercise;UE/LE Strength taining/ROM;UE/LE Coordination activities;Wheelchair propulsion/positioning OT Self Feeding Anticipated Outcome(s): independent (  current level) OT Basic Self-Care Anticipated Outcome(s): supervision OT Toileting Anticipated Outcome(s):  supervision OT Bathroom Transfers Anticipated Outcome(s): supervision OT Recommendation Patient destination: Home Follow Up Recommendations: Home health OT Equipment Recommended: None recommended by OT  Skilled Therapeutic Intervention Initial 1:1 occupational therapy evaluation completed. Patient with complaints of pain upon entering room (7/10 in left hip), therapist assisted with re-positioning patient. Patient transferred w/c > recliner using RW and minimal assistance in order to help decrease pain. Patient with no complaints of pain once re-positioned in recliner. Patient refused bathing with a male OT, and patient was dressed prior to session. Focused skilled intervention on doffing/donning of socks and shoes, functional transfer recliner <> BSC using cane, management of wound vac machine, and overall activity tolerance/endurance. At end of session, left patient seated in recliner with BLEs elevated and all needs within reach. Patient's wife present during evaluation/session.   Precautions/Restrictions  Precautions Precautions: Fall Precaution Comments: wound vac Restrictions Weight Bearing Restrictions: No  General Chart Reviewed: Yes Family/Caregiver Present: Yes (Wife)  Vital Signs Therapy Vitals BP: 124/60 mmHg  Pain Pain Assessment Pain Assessment: 0-10 Pain Score: 7  Pain Type: Acute pain Pain Location: Hip Pain Orientation: Left Pain Descriptors / Indicators: Cramping Pain Onset: Gradual Pain Intervention(s): Repositioned;RN made aware Multiple Pain Sites: No  Home Living/Prior Functioning Home Living Family/patient expects to be discharged to:: Private residence Living Arrangements: Spouse/significant other Available Help at Discharge: Family Type of Home: House Home Access: Stairs to enter Technical brewer of Steps: 16 steps in the front; 1 in the back (usually enter in the back) Home Layout: Two level;Bed/bath upstairs Alternate Level  Stairs-Number of Steps: flight, patient has lift chair Alternate Level Stairs-Rails: Right;Left  Lives With: Spouse IADL History Homemaking Responsibilities: No Mode of Transportation: Family Occupation: Retired Type of Occupation: Retired from [employer], patient states he worked there for about 36 years Leisure and Hobbies: yard work Prior Function Level of Independence: Requires assistive device for independence  Able to Take Stairs?: No (patient has chair lift at home) Comments: used cane PTA  ADL - See FIM  Vision/Perception  Vision- History Baseline Vision/History: Wears glasses Wears Glasses: At all times Patient Visual Report: No change from baseline Vision- Assessment Vision Assessment?: No apparent visual deficits   Cognition Overall Cognitive Status: Within Functional Limits for tasks assessed Orientation Level: Oriented X4 Memory: Appears intact Awareness: Appears intact Problem Solving: Appears intact Safety/Judgment: Appears intact  Sensation Sensation Light Touch:  (denies numbess/tingling RLE; LLE limited due to ACE wrap) Proprioception: Appears Intact Coordination Gross Motor Movements are Fluid and Coordinated: Yes (patient with decreased gross motor movements in left UE/hand due to RCVA in 2008) Fine Motor Movements are Fluid and Coordinated: Yes (patient with decreased fine motor movements in left UE/hand due to RCVA in 2008)  Motor  Motor Motor: Hemiplegia (L sided from h/o CVA (UE > LE); tremors noted in LUE)  Trunk/Postural Assessment  Cervical Assessment Cervical Assessment: Within Functional Limits Thoracic Assessment Thoracic Assessment: Exceptions to [facility] (kyphotic posture seated; lateral lean to R in standing) Lumbar Assessment Lumbar Assessment: Exceptions to [facility] (posterior tilt) Postural Control Postural Control: Deficits on evaluation   Balance Balance Balance Assessed: Yes Static Sitting Balance Static Sitting - Level of  Assistance: 6: Modified independent (Device/Increase time) Dynamic Sitting Balance Dynamic Sitting - Level of Assistance: 5: Stand by assistance Static Standing Balance Static Standing - Level of Assistance: 4: Min assist Dynamic Standing Balance Dynamic Standing - Level of Assistance: 4: Min assist;3:  Mod assist  Extremity/Trunk Assessment RUE Assessment RUE Assessment: Within Functional Limits (can benefit from RUE strengthening) LUE Assessment LUE Assessment: Exceptions to [facility] LUE AROM (degrees) Overall AROM Left Upper Extremity: Due to premorbid status;Deficits (patient with RCVA in 2008) LUE PROM (degrees) Overall PROM Left Upper Extremity: Due to premorbid status;Deficits LUE Strength LUE Overall Strength: Deficits;Due to premorbid status  FIM:  FIM - Eating Eating Activity: 0: Activity did not occur FIM - Grooming Grooming: 0: Activity did not occur FIM - Bathing Bathing: 0: Activity did not occur FIM - Upper Body Dressing/Undressing Upper body dressing/undressing steps patient completed: Thread/unthread right sleeve of pullover shirt/dresss;Thread/unthread left sleeve of pullover shirt/dress;Put head through opening of pull over shirt/dress Upper body dressing/undressing: 4: Min-Patient completed 75 plus % of tasks FIM - Lower Body Dressing/Undressing Lower body dressing/undressing: 1: Total-Patient completed less than 25% of tasks FIM - Control and instrumentation engineer Devices: [place];Arm rests Bed/Chair Transfer: 4: Bed > Chair or W/C: Min A (steadying Pt. > 75%) FIM - Radio producer Devices: Bedside commode;Cane Toilet Transfers: 4-To toilet/BSC: Min A (steadying Pt. > 75%);4-From toilet/BSC: Min A (steadying Pt. > 75%) FIM - Tub/Shower Transfers Tub/shower Transfers: 0-Activity did not occur or was simulated   Refer to Care Plan for Long Term Goals  Recommendations for other services: None at this time.   Discharge  Criteria: Patient will be discharged from OT if patient refuses treatment 3 consecutive times without medical reason, if treatment goals not met, if there is a change in medical status, if patient makes no progress towards goals or if patient is discharged from hospital.  The above assessment, treatment plan, treatment alternatives and goals were discussed and mutually agreed upon: by patient  Estelle June 09/15/2013, 11:57 AM

## 2013-09-15 NOTE — Progress Notes (Signed)
[place] PHYSICAL MEDICINE & REHABILITATION     PROGRESS NOTE    Subjective/Complaints: Had a good night. Left leg sore  Objective: Vital Signs: Blood pressure 163/51, pulse 88, temperature 97.7 F (36.5 C), temperature source Oral, resp. rate 18, SpO2 96.00%. No results found.  Recent Labs  09/15/13 0535  WBC 6.9  HGB 7.9*  HCT 26.5*  PLT 418*    Recent Labs  09/15/13 0535  NA 140  K 4.4  CL 103  GLUCOSE 115*  BUN 14  CREATININE 1.03  CALCIUM 8.8   CBG (last 3)   Recent Labs  09/14/13 1120 09/14/13 1624 09/14/13 2030  GLUCAP 145* 144* 140*    Wt Readings from Last 3 Encounters:  09/11/13 79.379 kg (175 lb)  09/11/13 79.379 kg (175 lb)  09/11/13 79.379 kg (175 lb)    Physical Exam:  HENT: oral mucosa pink and moist  Head: Normocephalic.  Eyes: EOM are normal.  Neck: Normal range of motion. Neck supple. No thyromegaly present.  Cardiovascular: Normal rate and regular rhythm. No murmur  Respiratory: Effort normal and breath sounds normal. No respiratory distress. No wheezes or rales  GI: Soft. Bowel sounds are normal. He exhibits no distension.  Musculoskeletal: mild pain LUE with PROM today  Neurological: He is alert.  Speech is intelligible. He is oriented x3 and follows basic commands. Fair insight and awareness.  motor strength is 5/5 in the right deltoid, bicep, tricep, grip, hip flexors, knee extensors, ankle dorsiflexion  3 minus to 2+ in the left deltoid, bicep, tricep, and grip with increased tone in the finger and wrist flexors as well as elbow Ashworth grade 2/4  1+ to 2- in the left ankle dorsiflexor, plantar flexors, 3 minus to 2+ in the knee extensor and hip flexor.  Decreased sensation left hand and foot.  Skin: left leg dressed-vacuum sealed/intact. Area tender    Assessment/Plan: 1. Functional deficits secondary to deconditioning after thrombectomy, LLE BPG and fasciotomy which require 3+ hours per day of interdisciplinary therapy  in a comprehensive inpatient rehab setting. Physiatrist is providing close team supervision and 24 hour management of active medical problems listed below. Physiatrist and rehab team continue to assess barriers to discharge/monitor patient progress toward functional and medical goals. FIM:                                  Medical Problem List and Plan:  1. Functional deficits secondary to Deconditioning after thrombectomy of occluded femoropopliteal bypass as well as fasciotomy requiring wound vac in a patient with chronic left hemiparesis  2. DVT Prophylaxis/Anticoagulation: Pharmaceutical: Lovenox  3. Pain Management: Will add OxyContin for more consistent pain relief--continue prn oxycodone. Continue Neurontin tid.  4. Mood: Provide ego support. LCSW to follow for evaluation and support.  5. Neuropsych: This patient is capable of making decisions on his own behalf.  6. HTN: Will monitor every 8 hours--has been on the low side. Continue Prinivil daily and adjust as needed  7. DM type 2: Will monitor BS with ac/hs checks. Continue metformin and levemir daily with SSI for elevated BS---controlled relatively well so far 8. H/o CVA with left hemiparesis: On aggrenox bid for stroke prevention.  9. Anal cancer with liver mets/chronic intermittent constipation: Continue meslamine. Will augment bowel program due to constipation issues.  10. H/o chronic groin wound: continue on IV Ancef for wound prophylaxis  (09/11/13). 11. Anemia: 7.9, no overt blood  loss---  -had been around 8 previously, recheck wednesday  LOS (Days) 1 A FACE TO FACE EVALUATION WAS PERFORMED  Meredith Staggers 09/15/2013 7:30 AM

## 2013-09-15 NOTE — Progress Notes (Signed)
Physical Therapy Note  Patient Details  Name: Donald [name] MRN: [id number] Date of Birth: 01/06/1946 Today's Date: 09/15/2013  11:30 - 12:00 30 minutes Individual session. Patient reports soreness in left calf from surgical site as well as throbbing, spasm pain in left hip. Pain reported to RN and checking to see if pain meds are due.  Patient sitting in recliner upon entering room. Requested for patient to attempt ambulation with rolling walker. Patient insisted that he use cane as he feels more comfortable with cane. Patient sit to stand and ambulated 15 feet with straight cane with mod assist for balance and +2 assist for wheelchair behind with wound vac. Patient reported that he had to stop due to pain in left LE. Patient propelled wheelchair 120 feet using right extremities with supervision and assist with wound vac. Patient transferred wheelchair to mat with min assist to get weight forward for sit to stand. Patient performed sit <> supine with supervision. Patient complained of left hip pain. PT positioned LE on pillow and performed pressure point to loosen IT band and hip musculature. Patient returned to room and recliner with min assist. Patient left in recliner with LE's elevated and all items in reach. Wife also present.   [place] 09/15/2013, 12:20 PM

## 2013-09-15 NOTE — Progress Notes (Signed)
Patient transferred to rehab from 2W08.  Patient is alert and oriented X 4.  Wound VAC intact LLE.

## 2013-09-15 NOTE — Progress Notes (Signed)
Physical Therapy Session Note  Patient Details  Name: Donald [name] MRN: [id number] Date of Birth: 1946-02-16  Today's Date: 09/15/2013 Time: 1751-0258 Time Calculation (min): 30 min  Short Term Goals: Week 1:  PT Short Term Goal 1 (Week 1): Pt will be able to demonstrate dynamic standing balance during functional task with steady A PT Short Term Goal 2 (Week 1): Pt will be able to gait x 50' with min A PT Short Term Goal 3 (Week 1): Pt will be able to go up/down 1 step for home entry with min A PT Short Term Goal 4 (Week 1): Pt will be able to propel w/c x 22' with S  Skilled Therapeutic Interventions/Progress Updates:    Session focused on functional transfers (with SPC and RW with overall min A and cues needed for safe technique), gait trial with RW (x 8') with L hand splint with min A but pt reports he will not use that, and seated LE therex on Kinetron for LE strengthening x 3 trials of 1-2 min each. Therapist also changed out w/c cushion for improved pressure relief as pt has Stage II ulcer on bottom. Left seated EOB with wife present in room.  Therapy Documentation Precautions:  Precautions Precautions: Fall Precaution Comments: wound vac Restrictions Weight Bearing Restrictions: No  Pain: Reports LLE sore but manageable at the current time.   See FIM for current functional status  Therapy/Group: Individual Therapy  Lars Masson 09/15/2013, 4:06 PM

## 2013-09-16 ENCOUNTER — Inpatient Hospital Stay (HOSPITAL_COMMUNITY): Payer: Medicare Other

## 2013-09-16 ENCOUNTER — Encounter: Payer: Medicare Other | Admitting: Internal Medicine

## 2013-09-16 ENCOUNTER — Inpatient Hospital Stay (HOSPITAL_COMMUNITY): Payer: Medicare Other | Admitting: Occupational Therapy

## 2013-09-16 ENCOUNTER — Encounter (HOSPITAL_COMMUNITY): Payer: Medicare Other | Admitting: Occupational Therapy

## 2013-09-16 DIAGNOSIS — I999 Unspecified disorder of circulatory system: Secondary | ICD-10-CM

## 2013-09-16 DIAGNOSIS — I739 Peripheral vascular disease, unspecified: Secondary | ICD-10-CM

## 2013-09-16 DIAGNOSIS — R5381 Other malaise: Secondary | ICD-10-CM

## 2013-09-16 LAB — GLUCOSE, CAPILLARY
GLUCOSE-CAPILLARY: 110 mg/dL — AB (ref 70–99)
Glucose-Capillary: 141 mg/dL — ABNORMAL HIGH (ref 70–99)
Glucose-Capillary: 113 mg/dL — ABNORMAL HIGH (ref 70–99)
Glucose-Capillary: 118 mg/dL — ABNORMAL HIGH (ref 70–99)

## 2013-09-16 MED ORDER — FERROUS FUMARATE 325 (106 FE) MG PO TABS
1.0000 | ORAL_TABLET | Freq: Two times a day (BID) | ORAL | Status: DC
  Administered 2013-09-16 – 2013-09-23 (×16): 106 mg via ORAL
  Filled 2013-09-16 (×19): qty 1

## 2013-09-16 NOTE — Progress Notes (Signed)
Vascular surgeon and wound care nurse at bedside. Wound vac dressing change done by wound nurse. Discussed condition of leg, treatment plan. Pain medication given. Dry dressing to left groin done by this nurse, no drainage, dry intact. Will monitor activity, comfort, and encouraged good nutrition (proteins) for wound healing. Tonita Cong, RN

## 2013-09-16 NOTE — Progress Notes (Signed)
Social Work Patient ID: Donald [name], male   DOB: 10/25/1945, 68 y.o.   MRN: [id number]  Met with pt and wife yesterday afternoon to review team conference.  Both aware and agreeable with targeted d/c date of 5/28 with supervision goals.  Pt aware he will likely d/c home with wound VAC.  Have begun gathering necessary paperwork for home VAC.  Will coritnue to follow for support and d/c planning.  Lennart Pall, LCSW

## 2013-09-16 NOTE — Progress Notes (Signed)
This nurse discussed pain management and constipation with oncoming night nurse. Patient medicated with scheduled Oxy, intermittent Oxy IR today with intermittent relief. Pain 8/10 down to about 5/10. Iron started. New onset complaint of abdominal pain. To monitor. Tonita Cong, RN

## 2013-09-16 NOTE — Progress Notes (Signed)
Occupational Therapy Session Note  Patient Details  Name: Donald [name] MRN: [id number] Date of Birth: May 19, 1945  Today's Date: 09/16/2013 Time: 4332-9518 Time Calculation (min): 44 min  Short Term Goals: Week 1:  OT Short Term Goal 1 (Week 1): Short Term Goals = Long Term Goals due to ELOS  Skilled Therapeutic Interventions/Progress Updates:    Pt performed bathing and dressing sit to stand at the sink.  He needed min assist for washing the right arm and mod assist for washing the LB secondary to standing balance and not being able to reach his left foot.  Pt needed assistance with donning his shorts over both of his LEs.  He needed 4 attempts to donn his pullover shirt as he continually would get it backwards.  No attempted functional use of the LUE during the session.  He performed transfer to the recliner with mod assist stand pivot using his single point cane.  Pt with decreased ability to donn his left sock and shoe secondary to not being able to cross his leg over his knee as he does usually since the wound vac is placed.   Therapy Documentation Precautions:  Precautions Precautions: Fall Precaution Comments: wound vac Restrictions Weight Bearing Restrictions: No  Pain: Pain Assessment Pain Assessment: Faces Faces Pain Scale: Hurts little more Pain Type: Acute pain Pain Location: Leg Pain Orientation: Left ADL: See FIM for current functional status  Therapy/Group: Individual Therapy  Cindra Presume OTR/L 09/16/2013, 11:54 AM

## 2013-09-16 NOTE — Progress Notes (Signed)
Physical Therapy Session Note  Patient Details  Name: Donald [name] MRN: [id number] Date of Birth: 02-15-1946  Today's Date: 09/16/2013 Time: 1000-1055 Time Calculation (min): 55 min  Short Term Goals: Week 1:  PT Short Term Goal 1 (Week 1): Pt will be able to demonstrate dynamic standing balance during functional task with steady A PT Short Term Goal 2 (Week 1): Pt will be able to gait x 50' with min A PT Short Term Goal 3 (Week 1): Pt will be able to go up/down 1 step for home entry with min A PT Short Term Goal 4 (Week 1): Pt will be able to propel w/c x 68' with S  Skilled Therapeutic Interventions/Progress Updates:   c/o pain throughout session in LLE - premedicated and rest breaks taken as needed. Session focused on w/c mobility using RUE and RLE with S for functional mobility and endurance training, gait training with SPC (mod A and cues for safety x 10'; +2 needed for w/c follow and wound vac management) and RW with L hand splint (min A and able to go about 15-20' each trial), stair negotiation with RW on curb step to simulate home entry x 2 reps with cues for technique and second person to manage wound vac, and transfers/bed mobility/gait in ADL apartment for home environment mobility training. Pt requires cueing for safety and to slow down during mobility; he is quick to get up without checking that w/c is locked, forgets to reach back before sitting down (min to mod A needed to control descent), and tends to move quicker than is safe for his balance - educated pt on importance of this to which he verbalized understanding and awareness. Also discussed recommendation for using RW to start at this time due to decreased balance and increased pain despite pt only wanting to use cane but requires increased A with cane and ability to walk much shorter distance due to increased effort needed through LLE - pt agreeable after lengthy discussion on goals and safety in the home in planning for  d/c. Also discussed having his wife get measurements of home to determine accessibility with w/c for times when pain is to high to safely walk in the home - plan to give handout to leave for wife and discuss further in next session.   Therapy Documentation Precautions:  Precautions Precautions: Fall Precaution Comments: wound vac Restrictions Weight Bearing Restrictions: No  Locomotion : Ambulation Ambulation/Gait Assistance: 1: +2 Total assist;4: Min assist;3: Mod assist   See FIM for current functional status  Therapy/Group: Individual Therapy  Lars Masson 09/16/2013, 11:01 AM

## 2013-09-16 NOTE — Progress Notes (Signed)
Occupational Therapy Session Note  Patient Details  Name: Donald [name] MRN: [id number] Date of Birth: 01-15-1946  Today's Date: 09/16/2013 Time: 1300-1330 Time Calculation (min): 30 min  Short Term Goals: Week 1:  OT Short Term Goal 1 (Week 1): Short Term Goals = Long Term Goals due to ELOS  Skilled Therapeutic Interventions/Progress Updates:  Patient received seated in recliner with complaints of bilateral leg pain, RN aware. Patient transferred recliner > w/c using RW with steady assist and therapist managing wound vac. Therapist propelled patient > therapy gym and patient engaged in therapeutic activity focusing on sit<>stands, functional transfers, dynamic standing balance/tolerance/endurance, w/c mobility, and home management task using reacher. Therapist propelled patient back to room and patient transferred back to bed with steady assist. Patient continues to require assistance with managing wound vac. Left patient supine in bed with all needs within reach.   Precautions:  Precautions Precautions: Fall Precaution Comments: wound vac Restrictions Weight Bearing Restrictions: No  See FIM for current functional status  Therapy/Group: Individual Therapy  Estelle June 09/16/2013, 1:43 PM

## 2013-09-16 NOTE — Progress Notes (Addendum)
Physical Therapy Session Note  Patient Details  Name: Donald [name] MRN: [id number] Date of Birth: [birth year]  Today's Date: 09/16/2013 Time: 4098-1191 Time Calculation (min): 55 min  Short Term Goals: Week 1:  PT Short Term Goal 1 (Week 1): Pt will be able to demonstrate dynamic standing balance during functional task with steady A PT Short Term Goal 2 (Week 1): Pt will be able to gait x 50' with min A PT Short Term Goal 3 (Week 1): Pt will be able to go up/down 1 step for home entry with min A PT Short Term Goal 4 (Week 1): Pt will be able to propel w/c x 53' with S  Skilled Therapeutic Interventions/Progress Updates:   [name] present during session and discussed with her safety recommendations/concerns (wife also in agreement with pt being impulsive at times) as well as discussion about home set-up recommendations. It does not appear that a w/c will fit through most doorways (they measure the width of the RW per her report), so discussed using RW for short distances in the home and having planned chairs/rest spots for pt. Also discussed using a w/c for outdoor mobility and to get to the car as there is a gravel part of the walkway which would not be safe for pt ambulating at this time with RW. Practiced car transfer with RW with overall min A; cues needed to check brakes and manage tubing on wound vac. Gait training with RW with L hand splint x 20', x 30', and x 33' with min A and +2 to manage wound vac and w/c; cues for safety and hand placement with sit <-> stand. Educated pt on importance of reaching back for chair and controlling descent vs. Just falling into the chair  -verbalized understanding but continues to require cueing each time. Pt also completed seated therex on Kinetron x 5 min for functional LE strengthening. Transferred back to bed end of session and LLE elevated.  Therapy Documentation Precautions:  Precautions Precautions: Fall Precaution Comments: wound  vac Restrictions Weight Bearing Restrictions: No  Pain: C/o pain in LLE and R hip - premedicated.   See FIM for current functional status  Therapy/Group: Individual Therapy  Lars Masson 09/16/2013, 3:42 PM

## 2013-09-16 NOTE — Consult Note (Signed)
WOC wound follow up Wound type: open surgical wound lateral left calf, closed surgical wound medial left calf. Measurement: lateral left calf: 21cm x 4cm x 1.0cm  Wound bed: beefy red, viable muscle  Drainage (amount, consistency, odor) moderate serosanguinous in canister  Periwound: some remaining erythema  at  8-9 o'clock, but does not appear to be extending.  Discoloration of the 1st and 2nd toes has not extended, still some discoloration lateral 5th toe Dressing procedure/placement/frequency:  2pc of black granufoam used to fill the wound bed today.  Protected the muscle with Mepitel layer first. Seal at 125 mmHG. Pt tolerated well.   Dr. Scot Dock at the bedside to assess the wound and extremity.  Will meet [place] nurse on Friday am at 0730 to reassess the wound at that time.  WOC will follow along with you for VAC dressing changes. [place], [place]

## 2013-09-16 NOTE — Progress Notes (Signed)
Social Work  Social Work Assessment and Plan  Patient Details  Name: Donald [name] MRN: [id number] Date of Birth: 02/21/1946  Today's Date: 09/16/2013  Problem List:  Patient Active Problem List   Diagnosis Date Noted  . Ischemic leg 09/05/2013  . Wound discharge 04/09/2013  . PVD (peripheral vascular disease) with claudication 01/14/2013  . Hemiparesis affecting left side as late effect of stroke 01/05/2013  . Abnormality of gait 09/03/2012  . Other acquired deformity of ankle and foot(736.79) 09/03/2012  . Aftercare following surgery of the circulatory system, [place] 05/15/2012  . 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 Peripheral vascular disease, unspecified 03/06/2012  . PVD (peripheral vascular disease) 02/21/2012  . Occlusion and stenosis of carotid artery without mention of cerebral infarction 02/21/2012  . Physical deconditioning 01/31/2012  . Atherosclerotic peripheral vascular disease with rest pain 01/24/2012  . Preoperative evaluation to rule out surgical contraindication 01/24/2012  . Anal carcinoma 10/30/2011  . Anal cancer 02/26/2011  . [place] MALIGNANT NEOPLASM OF LARGE INTESTINE&RECTUM 11/09/2008  . NAUSEA AND VOMITING 11/09/2008  . CONSTIPATION 08/18/2008  . RECTAL BLEEDING 08/18/2008  . ABDOMINAL PAIN -GENERALIZED 08/18/2008  . DM 08/17/2008  . HYPERLIPIDEMIA 08/17/2008  . HEMORRHOIDS 08/17/2008  . CONSTIPATION, CHRONIC 08/17/2008  . CEREBROVASCULAR ACCIDENT, HX OF 08/17/2008   Past Medical History:  Past Medical History  Diagnosis Date  . GERD (gastroesophageal reflux disease)   . Hyperlipidemia   . Hypertension   . Peripheral vascular disease   . Internal and external bleeding hemorrhoids   . History of gout     left great toe  . Depression   . Numbness in right leg     Hx: of diabetes  . Coronary artery disease     RCA occlusion with good collaterals, o/w no obs CAD 12/2011  . Pneumonia ~ 1977   . Type II diabetes mellitus   . History of blood transfusion 12/2011; 12/2012    "before OR; after OR" (01/22/2013)  . Stroke 2008    "left side weak; unable to move left hand still" (01/22/2013)  . Rectal cancer dx'd 08/2007    S/P chemo, radiation, biopsies  . Metastases to the liver dx'd 08/2010   Past Surgical History:  Past Surgical History  Procedure Laterality Date  . Carotid endarterectomy Right 2008  . Liver cryoablation  08/2010    "chemo shrunk the cancer 50% then they went in and burned the rest of it"  . Axillary-femoral bypass graft  01/29/2012    Procedure: BYPASS GRAFT AXILLA-BIFEMORAL;  Surgeon: Elam Dutch, MD;  [facility];  Service: Vascular;  Laterality: N/A;  Left axilla-bifemoral bypass using gore-tex graft.   . Colonoscopy w/ biopsies and polypectomy      Hx: of  . Femoral-popliteal bypass graft Left 01/06/2013    Procedure: Femoral-Popliteal Artery Bypass;  Surgeon: Elam Dutch, MD;  Location: [place];  Service: Vascular;  Laterality: Left;  . Intraoperative arteriogram Left 01/06/2013    Procedure: INTRA OPERATIVE ARTERIOGRAM;  Surgeon: Elam Dutch, MD;  Location: [place];  Service: Vascular;  Laterality: Left;  . Femoral-popliteal bypass graft Left 01/10/2013    Procedure: THROMBECTOMY POSSIBLE BYPASS GRAFT FEMORAL-POPLITEAL ARTERY;  Surgeon: Mal Misty, MD;  Location: [place];  Service: Vascular;  Laterality: Left;  Attempted Thrombectomy of left Femoral/Popliteal graft - Unsuccessful.  Insertion of Left femoral to below the knee popliteal propaten graft.  . Mass biopsy  2009    "found  mass that was cancer; later had chemo and radiation; never cut mass out" (01/22/2013)  . Femoral-popliteal bypass graft Left 09/05/2013    Procedure: Thrombectomy of Left Femoral-Below Knee Popliteal Bypass Graft; Patch Angioplasty of Distal Portion of Bypass Graft;  Surgeon: Angelia Mould, MD;  Location: [place];  Service: Vascular;  Laterality: Left;  . Patch  angioplasty Left 09/05/2013    Procedure: Patch Angioplasty Distal Portion of Left Femoral-Below Knee Popliteal Bypass Graft;  Surgeon: Angelia Mould, MD;  Location: [place];  Service: Vascular;  Laterality: Left;  . Intraoperative arteriogram Left 09/05/2013    Procedure: INTRA OPERATIVE ARTERIOGRAM;  Surgeon: Angelia Mould, MD;  Location: [place];  Service: Vascular;  Laterality: Left;  . Fasciotomy Left 09/05/2013    Procedure: FASCIOTOMY;  Surgeon: Angelia Mould, MD;  Location: [place];  Service: Vascular;  Laterality: Left;  Marland Kitchen Vascular surgery    . Fasciotomy closure Left 09/11/2013    Procedure: CLOSURE OF FASCIOTOMY SITES LEFT LOWER EXTREMITY AND WOUND VAC APPLICATION;  Surgeon: Angelia Mould, MD;  Location: [place];  Service: Vascular;  Laterality: Left;   Social History:  reports that he has quit smoking. His smoking use included Cigarettes. He has a 5.7 pack-year smoking history. He has never used smokeless tobacco. He reports that he does not drink alcohol or use illicit drugs.  Family / Support Systems Marital Status: Married Patient Roles: Spouse Spouse/Significant Other: wife, Yael Angerer @ [phone] Children: none Anticipated Caregiver: wife Bethena Roys Ability/Limitations of Caregiver: no limitations, wife has been caregiver before with pt Caregiver Availability: 24/7 Family Dynamics: wife very attentive to pt and provides any assistance needed.  Wife is very soft-spoken and allows pt to direct conversation.  Social History Preferred language: English Religion: Baptist Cultural Background: NA Education: college Read: Yes Write: Yes Employment Status: Retired Freight forwarder Issues: None Guardian/Conservator: None - per MD, pt capable of making decisions on his own behalf   Abuse/Neglect Physical Abuse: Denies Verbal Abuse: Denies Sexual Abuse: Denies Exploitation of patient/patient's resources: Denies Self-Neglect: Denies  Emotional  Status Pt's affect, behavior adn adjustment status: pt very eager to make sure "...that you know I am trying my best..Marland KitchenMarland KitchenI hope they don't think I'm not trying..." Offered reassurance to pt that team has not reported any concerns about his participation.  Pt admits frustration with another hosptialization.  "i'm just ready to be through all of this.  I'm tired of being sick".  Denies any s/s of depression but admits frustration.  Will refer to neuropsych as appropriate. Recent Psychosocial Issues: chronic health issues and multiple hospitalizations Pyschiatric History: None Substance Abuse History: None  Patient / Family Perceptions, Expectations & Goals Pt/Family understanding of illness & functional limitations: Pt and wife with basic understanding of medical issues, need for [facility] and likely plan to go home with VAC.   Premorbid pt/family roles/activities: Pt was independent overall PTA at home, but limited community level activity. Anticipated changes in roles/activities/participation: little change anticipated if reaches supervision goals as wife was already providing this LOC Pt/family expectations/goals: "I just want to get better and get past all of this."  [employer]: None Premorbid Home Care/DME Agencies: Other (Comment) [place] [facility]) Transportation available at discharge: yes Resource referrals recommended: Psychology  Discharge Planning Living Arrangements: Spouse/significant other Support Systems: Spouse/significant other Type of Residence: Private residence Insurance Resources: Education officer, museum (specify) Scientist, clinical (histocompatibility and immunogenetics)) Financial Resources: Radio broadcast assistant Screen Referred: No Living Expenses: Higher education careers adviser Management: Patient Does the patient  have any problems obtaining your medications?: No Home Management: mostly wife Patient/Family Preliminary Plans: pt plans to return home with wife who can provide 24/7 assistance Social Work  Anticipated Follow Up Needs: HH/OP Expected length of stay: 7-10 days  Clinical Impression Return pt to CIR (here in 2014) who is now with wound VAC and deconditioned.  Wife very supportive and provides 24/7 assistance even PTA.  Pt admits frustration with his chronic health issues and hospitalizations but hopeful he will see a good recovery here on unit.  Will follow for support and d/c planning.  Lennart Pall 09/16/2013, 1:16 PM

## 2013-09-16 NOTE — Progress Notes (Signed)
[facility] PHYSICAL MEDICINE & REHABILITATION     PROGRESS NOTE    Subjective/Complaints: No new issues. Left leg sore. Feels that therapy went fairly well yesterday  Objective: Vital Signs: Blood pressure 160/66, pulse 88, temperature 97.9 F (36.6 C), temperature source Oral, resp. rate 18, SpO2 98.00%. No results found.  Recent Labs  09/15/13 0535  WBC 6.9  HGB 7.9*  HCT 26.5*  PLT 418*    Recent Labs  09/15/13 0535  NA 140  K 4.4  CL 103  GLUCOSE 115*  BUN 14  CREATININE 1.03  CALCIUM 8.8   CBG (last 3)   Recent Labs  09/15/13 1126 09/15/13 1649 09/15/13 2056  GLUCAP 146* 130* 139*    Wt Readings from Last 3 Encounters:  09/11/13 79.379 kg (175 lb)  09/11/13 79.379 kg (175 lb)  09/11/13 79.379 kg (175 lb)    Physical Exam:  HENT: oral mucosa pink and moist  Head: Normocephalic.  Eyes: EOM are normal.  Neck: Normal range of motion. Neck supple. No thyromegaly present.  Cardiovascular: Normal rate and regular rhythm. No murmur  Respiratory: Effort normal and breath sounds normal. No respiratory distress. No wheezes or rales  GI: Soft. Bowel sounds are normal. He exhibits no distension.  Musculoskeletal: mild pain LUE with PROM today  Neurological: He is alert.  Speech is intelligible. He is oriented x3 and follows basic commands. Fair insight and awareness.  motor strength is 5/5 in the right deltoid, bicep, tricep, grip, hip flexors, knee extensors, ankle dorsiflexion  3 minus to 2+ in the left deltoid, bicep, tricep, and grip with increased tone in the finger and wrist flexors as well as elbow Ashworth grade 2/4  1+ to 2- in the left ankle dorsiflexor, plantar flexors, 3 minus to 2+ in the knee extensor and hip flexor.  Decreased sensation left hand and foot.  Skin: left leg dressed-vacuum sealed/intact. Left foot/toes with ischemia, especially digit 3   Assessment/Plan: 1. Functional deficits secondary to deconditioning after thrombectomy,  LLE BPG and fasciotomy which require 3+ hours per day of interdisciplinary therapy in a comprehensive inpatient rehab setting. Physiatrist is providing close team supervision and 24 hour management of active medical problems listed below. Physiatrist and rehab team continue to assess barriers to discharge/monitor patient progress toward functional and medical goals. FIM: FIM - Bathing Bathing: 0: Activity did not occur  FIM - Upper Body Dressing/Undressing Upper body dressing/undressing steps patient completed: Thread/unthread right sleeve of pullover shirt/dresss;Thread/unthread left sleeve of pullover shirt/dress;Put head through opening of pull over shirt/dress Upper body dressing/undressing: 4: Min-Patient completed 75 plus % of tasks FIM - Lower Body Dressing/Undressing Lower body dressing/undressing: 1: Total-Patient completed less than 25% of tasks  FIM - Toileting Toileting: 0: Activity did not occur  FIM - Radio producer Devices: Bedside commode;Cane Toilet Transfers: 4-To toilet/BSC: Min A (steadying Pt. > 75%);4-From toilet/BSC: Min A (steadying Pt. > 75%)  FIM - Bed/Chair Transfer Bed/Chair Transfer Assistive Devices: Arm rests Bed/Chair Transfer: 4: Chair or W/C > Bed: Min A (steadying Pt. > 75%);4: Bed > Chair or W/C: Min A (steadying Pt. > 75%)  FIM - Locomotion: Wheelchair Distance: 120 Locomotion: Wheelchair: 2: Travels 50 - 149 ft with supervision, cueing or coaxing FIM - Locomotion: Ambulation Locomotion: Ambulation Assistive Devices: Journalist, newspaper Ambulation/Gait Assistance: 1: +2 Total assist Locomotion: Ambulation: 1: Two helpers  Comprehension Comprehension Mode: Auditory Comprehension: 6-Follows complex conversation/direction: With extra time/assistive device  Expression Expression Mode: Verbal Expression: 5-Expresses  basic needs/ideas: With no assist  Social Interaction Social Interaction: 6-Interacts appropriately with  others with medication or extra time (anti-anxiety, antidepressant).  Problem Solving Problem Solving: 5-Solves basic problems: With no assist  Memory Memory: 6-More than reasonable amt of time  Medical Problem List and Plan:  1. Functional deficits secondary to Deconditioning after thrombectomy of occluded femoropopliteal bypass as well as fasciotomy requiring wound vac in a patient with chronic left hemiparesis   -await surgical plan 2. DVT Prophylaxis/Anticoagulation: Pharmaceutical: Lovenox  3. Pain Management: Will add OxyContin for more consistent pain relief--continue prn oxycodone. Continue Neurontin tid.  4. Mood: Provide ego support. LCSW to follow for evaluation and support.  5. Neuropsych: This patient is capable of making decisions on his own behalf.  6. HTN: Will monitor every 8 hours--has been on the low side. Continue Prinivil daily and adjust as needed  7. DM type 2: Will monitor BS with ac/hs checks. Continue metformin and levemir daily with SSI for elevated BS---controlled relatively well so far 8. H/o CVA with left hemiparesis: On aggrenox bid for stroke prevention.  9. Anal cancer with liver mets/chronic intermittent constipation: Continue meslamine. Will augment bowel program due to constipation issues.  10. H/o chronic groin wound: continue on IV Ancef for wound prophylaxis  (09/11/13). 11. Anemia: 7.9, no overt blood loss---  -had been around 8 previously, recheck tomorrow  LOS (Days) 2 A FACE TO FACE EVALUATION WAS PERFORMED  Meredith Staggers 09/16/2013 7:22 AM

## 2013-09-16 NOTE — Care Management Note (Signed)
Inpatient [facility] Individual Statement of Services  Patient Name:  Donald [name]  Date:  09/16/2013  Welcome to the [facility].  Our goal is to provide you with an individualized program based on your diagnosis and situation, designed to meet your specific needs.  With this comprehensive rehabilitation program, you will be expected to participate in at least 3 hours of rehabilitation therapies Monday-Friday, with modified therapy programming on the weekends.  Your rehabilitation program will include the following services:  Physical Therapy (PT), Occupational Therapy (OT), 24 hour per day rehabilitation nursing, Therapeutic Recreaction (TR), Case Management (Social Worker), Rehabilitation Medicine, Nutrition Services and Pharmacy Services  Weekly team conferences will be held on Tuesdays to discuss your progress.  Your Social Worker will talk with you frequently to get your input and to update you on team discussions.  Team conferences with you and your family in attendance may also be held.  Expected length of stay: 10 days  Overall anticipated outcome: supervision  Depending on your progress and recovery, your program may change. Your Social Worker will coordinate services and will keep you informed of any changes. Your Social Worker's name and contact numbers are listed  below.  The following services may also be recommended but are not provided by the [facility] will be made to provide these services after discharge if needed.  Arrangements include referral to agencies that provide these services.  Your insurance has been verified to be:  Medicare and [place] Your primary doctor is:  Dr. Iona Beard  Pertinent information will be shared with your doctor and your insurance company.  Social Worker:  Lennart Pall, [place] or ([phone]   Information discussed with and copy given to patient by: Lennart Pall, 09/16/2013, 1:21 PM

## 2013-09-16 NOTE — IPOC Note (Signed)
Overall Plan of Care [facility]) Patient Details Name: Donald [name] MRN: [id number] DOB: July 02, 1945  Admitting Diagnosis: Deconditioning s p  L fem below knee bypass w fasciotomy  Hospital Problems: Active Problems:   Ischemic leg     Functional Problem List: Nursing    PT Balance;Edema;Endurance;Motor;Pain;Safety;Sensory;Skin Integrity  OT Balance;Endurance;Motor;Pain;Perception;Safety;Sensory;Skin Integrity  SLP    TR         Basic ADL's: OT Grooming;Bathing;Dressing;Toileting     Advanced  ADL's: OT  (n/a)     Transfers: PT Bed Mobility;Bed to Chair;Car;Furniture  OT Toilet;Tub/Shower     Locomotion: PT Ambulation;Wheelchair Mobility;Stairs     Additional Impairments: OT None  SLP        TR      Anticipated Outcomes Item Anticipated Outcome  Self Feeding independent (current level)  Swallowing      Basic self-care  supervision  Toileting  supervision   Bathroom Transfers supervision  Bowel/Bladder     Transfers  S overall  Locomotion  S household gait distances; mod I w/c mobility  Communication     Cognition     Pain     Safety/Judgment      Therapy Plan: PT Intensity: Minimum of 1-2 x/day ,45 to 90 minutes PT Frequency: 5 out of 7 days PT Duration Estimated Length of Stay: 10-12 days OT Intensity: Minimum of 1-2 x/day, 45 to 90 minutes OT Frequency: 5 out of 7 days OT Duration/Estimated Length of Stay: ~10 days         Team Interventions: Nursing Interventions    PT interventions Ambulation/gait training;Balance/vestibular training;Community reintegration;Discharge planning;Disease management/prevention;DME/adaptive equipment instruction;Functional mobility training;Neuromuscular re-education;Pain management;Patient/family education;Skin care/wound management;Psychosocial support;Splinting/orthotics;Stair training;Therapeutic Activities;Therapeutic Exercise;UE/LE Strength taining/ROM;UE/LE Coordination activities;Wheelchair  propulsion/positioning  OT Interventions Balance/vestibular training;Community reintegration;Discharge planning;DME/adaptive equipment instruction;Functional mobility training;Pain management;Patient/family education;Psychosocial support;Self Care/advanced ADL retraining;Skin care/wound managment;Splinting/orthotics;Therapeutic Activities;Therapeutic Exercise;UE/LE Strength taining/ROM;UE/LE Coordination activities;Wheelchair propulsion/positioning  SLP Interventions    TR Interventions    SW/CM Interventions Discharge Planning;Psychosocial Support;Patient/Family Education    Team Discharge Planning: Destination: PT-Home ,OT- Home , SLP-  Projected Follow-up: PT-Home health PT, OT-  Home health OT, SLP-  Projected Equipment Needs: PT-To be determined, OT- None recommended by OT, SLP-  Equipment Details: PT-Pt already owns RW, [facility], and hospital bed. May need w/c at d/c., OT-  Patient/family involved in discharge planning: PT- Patient,  OT-Patient, SLP-   MD ELOS: 10 days Medical Rehab Prognosis:  Excellent Assessment: The patient has been admitted for CIR therapies with the diagnosis of left LE ischemia s/p BPG/ fasciotomy. The team will be addressing functional mobility, strength, stamina, balance, safety, adaptive techniques and equipment, self-care, bowel and bladder mgt, patient and caregiver education, pain mgt, WB precautions, wound care, ego support. Goals have been set at supervision for mobility and self-care. Pt may need further intervention to leg depending upon evolution of the wound and viability of leg.    Meredith Staggers, MD, FAAPMR      See Team Conference Notes for weekly updates to the plan of care

## 2013-09-16 NOTE — Patient Care Conference (Signed)
Inpatient RehabilitationTeam Conference and Plan of Care Update Date: 09/15/2013   Time: 2:10 PM    Patient Name: Donald [name]      Medical Record Number: [id number]  Date of Birth: 11-Mar-1946 Sex: Male         Room/Bed: 4M01C/4M01C-01 Payor Info: Payor: MEDICARE / Plan: MEDICARE PART A AND B / Product Type: *No Product type* /    Admitting Diagnosis: Deconditioning s p  L fem below knee bypass w fasciotomy  Admit Date/Time:  09/14/2013  6:13 PM Admission Comments: No comment available   Primary Diagnosis:  <principal problem not specified> Principal Problem: <principal problem not specified>  Patient Active Problem List   Diagnosis Date Noted  . Ischemic leg 09/05/2013  . Wound discharge 04/09/2013  . PVD (peripheral vascular disease) with claudication 01/14/2013  . Hemiparesis affecting left side as late effect of stroke 01/05/2013  . Abnormality of gait 09/03/2012  . Other acquired deformity of ankle and foot(736.79) 09/03/2012  . Aftercare following surgery of the circulatory system, [place] 05/15/2012  . 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 Peripheral vascular disease, unspecified 03/06/2012  . PVD (peripheral vascular disease) 02/21/2012  . Occlusion and stenosis of carotid artery without mention of cerebral infarction 02/21/2012  . Physical deconditioning 01/31/2012  . Atherosclerotic peripheral vascular disease with rest pain 01/24/2012  . Preoperative evaluation to rule out surgical contraindication 01/24/2012  . Anal carcinoma 10/30/2011  . Anal cancer 02/26/2011  . [place] MALIGNANT NEOPLASM OF LARGE INTESTINE&RECTUM 11/09/2008  . NAUSEA AND VOMITING 11/09/2008  . CONSTIPATION 08/18/2008  . RECTAL BLEEDING 08/18/2008  . ABDOMINAL PAIN -GENERALIZED 08/18/2008  . DM 08/17/2008  . HYPERLIPIDEMIA 08/17/2008  . HEMORRHOIDS 08/17/2008  . CONSTIPATION, CHRONIC 08/17/2008  . CEREBROVASCULAR ACCIDENT, HX OF 08/17/2008     Expected Discharge Date: Expected Discharge Date: 09/24/13  Team Members Present: Physician leading conference: Dr. Alger Simons Social Worker Present: Lennart Pall, LCSW Nurse Present: Janyth Pupa, RN PT Present: Canary Brim, PT OT Present: Willeen Cass, OT;Patricia Lissa Hoard, OT SLP Present: Weston Anna, SLP PPS Coordinator present : Daiva Nakayama, RN, CRRN;Becky Alwyn Ren, PT     Current Status/Progress Goal Weekly Team Focus  Medical   severe PVD related to DM leading to BPG----fasciotomy, with vac---pain issues  improve activity tolerance  improve functional mobility to allow him to return home given his wound care issues   Bowel/Bladder   Continent bowel/bladder, but spills urinal.  LBM 09/14/13.  To keep continent  Monitor q 2-3 hrs for void.   Swallow/Nutrition/ Hydration             ADL's   overall min>total assist with BADLs  overall supervision  BADLs, sit<>stands, dynamic standing, overall activity tolerance/endurance, functional mobility/transfers   Mobility   min A transfers; +2 gait and stairs due to wound vac management (min to mod A)  S overall  transfers, balance, endurance, pain management, gait training, stairs, functional strengthening   Communication             Safety/Cognition/ Behavioral Observations            Pain   Oxycontin 10 mg q 12 hrs, Neurontin 300 mg tid; and Oxy IR 10 mg q 4 hrs prn  keep pain < 3     monitor for pain q 4 hrs and prn; medicate as needed   Skin   wound VAC LLE; foam dsg to Stage II on coccyx, gauze dsg to lt groin incision  keep skin clear of further breakdown, infection  Monitor skin q shift.  monitor for urine spills, dsg changes as ordered.    Rehab Goals Patient on target to meet rehab goals: Yes *See Care Plan and progress notes for long and short-term goals.  Barriers to Discharge: pain, viability of leg    Possible Resolutions to Barriers:  wound care, pain mgt    Discharge Planning/Teaching Needs:  home  with wife who will resume 24/7 assistance      Team Discussion:  Anticipate to d/c home with wound VAC.  Pain in LLE very limiting.  Setting short ambulation goals with mobility and ADLs goals at supervision overall.    Revisions to Treatment Plan:  None at this time   Continued Need for Acute Rehabilitation Level of Care: The patient requires daily medical management by a physician with specialized training in physical medicine and rehabilitation for the following conditions: Daily direction of a multidisciplinary physical rehabilitation program to ensure safe treatment while eliciting the highest outcome that is of practical value to the patient.: Yes Daily medical management of patient stability for increased activity during participation in an intensive rehabilitation regime.: Yes Daily analysis of laboratory values and/or radiology reports with any subsequent need for medication adjustment of medical intervention for : Post surgical problems;Neurological problems  Lennart Pall 09/16/2013, 12:38 PM

## 2013-09-16 NOTE — Progress Notes (Signed)
   VASCULAR PROGRESS NOTE  SUBJECTIVE: No specific complaints.  PHYSICAL EXAM: Filed Vitals:   09/15/13 0838 09/15/13 1622 09/15/13 2059 09/16/13 0501  BP: 124/60 133/77 148/71 160/66  Pulse:  81 80 88  Temp:  98 F (36.7 C) 98.2 F (36.8 C) 97.9 F (36.6 C)  TempSrc:  Oral Oral Oral  Resp:  18 18 18   SpO2:  95% 94% 98%   Lateral fasciotomy site inspected. No significant change. The erythema on the anterior skin has not changed.  LABS: Lab Results  Component Value Date   WBC 6.9 09/15/2013   HGB 7.9* 09/15/2013   HCT 26.5* 09/15/2013   MCV 77.3* 09/15/2013   PLT 418* 09/15/2013   Lab Results  Component Value Date   CREATININE 1.03 09/15/2013   Lab Results  Component Value Date   INR 1.14 09/05/2013   CBG (last 3)   Recent Labs  09/15/13 1649 09/15/13 2056 09/16/13 0723  GLUCAP 130* 139* 141*    Active Problems:   Ischemic leg   ASSESSMENT AND PLAN:  * Am giving him the benefit of the doubt and will continue to follow the wound. The only other alternative would be above-the-knee amputation.  * I will try to look at the wound again on Friday at 7:30 AM.  Gae Gallop Beeper: [phone] 09/16/2013

## 2013-09-17 ENCOUNTER — Inpatient Hospital Stay (HOSPITAL_COMMUNITY): Payer: Medicare Other

## 2013-09-17 ENCOUNTER — Inpatient Hospital Stay (HOSPITAL_COMMUNITY): Payer: Medicare Other | Admitting: Physical Therapy

## 2013-09-17 ENCOUNTER — Encounter (HOSPITAL_COMMUNITY): Payer: Medicare Other | Admitting: Occupational Therapy

## 2013-09-17 LAB — GLUCOSE, CAPILLARY
GLUCOSE-CAPILLARY: 137 mg/dL — AB (ref 70–99)
GLUCOSE-CAPILLARY: 98 mg/dL (ref 70–99)
Glucose-Capillary: 101 mg/dL — ABNORMAL HIGH (ref 70–99)
Glucose-Capillary: 127 mg/dL — ABNORMAL HIGH (ref 70–99)

## 2013-09-17 LAB — CBC
HEMATOCRIT: 25.6 % — AB (ref 39.0–52.0)
Hemoglobin: 7.6 g/dL — ABNORMAL LOW (ref 13.0–17.0)
MCH: 23.3 pg — ABNORMAL LOW (ref 26.0–34.0)
MCHC: 29.7 g/dL — ABNORMAL LOW (ref 30.0–36.0)
MCV: 78.5 fL (ref 78.0–100.0)
PLATELETS: 444 10*3/uL — AB (ref 150–400)
RBC: 3.26 MIL/uL — ABNORMAL LOW (ref 4.22–5.81)
RDW: 21.1 % — AB (ref 11.5–15.5)
WBC: 6.7 10*3/uL (ref 4.0–10.5)

## 2013-09-17 MED ORDER — OXYCODONE HCL ER 20 MG PO T12A
20.0000 mg | EXTENDED_RELEASE_TABLET | Freq: Two times a day (BID) | ORAL | Status: DC
  Administered 2013-09-17 – 2013-09-24 (×15): 20 mg via ORAL
  Filled 2013-09-17 (×15): qty 1

## 2013-09-17 MED ORDER — OXYCODONE HCL ER 20 MG PO T12A: 20.0000 mg | EXTENDED_RELEASE_TABLET | Freq: Two times a day (BID) | ORAL | Status: DC

## 2013-09-17 NOTE — Progress Notes (Signed)
Occupational Therapy Session Note  Patient Details  Name: Donald [name] MRN: [id number] Date of Birth: February 04, 1946  Today's Date: 09/17/2013 Time: 0902-1000 Time Calculation (min): 58 min  Short Term Goals: Week 1:  OT Short Term Goal 1 (Week 1): Short Term Goals = Long Term Goals due to ELOS  Skilled Therapeutic Interventions/Progress Updates:    Pt transferred from the EOB to the wheelchair and over to the sink for bathing and dressing this session.  Min assist for stand pivot transfer using the RW for support.  Pt has a walker splint with velcro on the left side to help keep his hand in place secondary to his residual deficits from a previous CVA.  Min instructional cueing to wait and stand first before applying the LUE on the walker.  He still demonstrates increased pain in the LLE so he cannot cross it for dressing tasks.  May benefit from use of reacher to assist with LB dressing on that side.  Min assist for all sit to stand and for static standing balance while pulling pants over his hips or washing his peri area.  He was able to donn his pullover shirt this session with setup and only one instructional cue.  Transferred to recliner at end of session for positioning.   Therapy Documentation Precautions:  Precautions Precautions: Fall Precaution Comments: wound vac Restrictions Weight Bearing Restrictions: No  Pain: Pain Assessment Pain Assessment: No/denies pain Pain Score: 2  Pain Type: Acute pain Pain Location: Leg Pain Orientation: Left Pain Descriptors / Indicators: Aching Pain Frequency: Intermittent Pain Onset: Gradual Patients Stated Pain Goal: 2 Pain Intervention(s): Medication (See eMAR) ADL: See FIM for current functional status  Therapy/Group: Individual Therapy  Cindra Presume OTR/L 09/17/2013, 12:17 PM

## 2013-09-17 NOTE — Progress Notes (Signed)
Occupational Therapy Session Note  Patient Details  Name: Donald [name] MRN: [id number] Date of Birth: Sep 10, 1945  Today's Date: 09/17/2013 Time: 9379-0240 Time Calculation (min): 30 min  Short Term Goals: Week 1:  OT Short Term Goal 1 (Week 1): Short Term Goals = Long Term Goals due to ELOS  Skilled Therapeutic Interventions/Progress Updates:    Pt seen for 1:1 OT session with focus on dynamic standing balance, activity tolerance, and functional transfer. Pt received in recliner chair. Transferred to w/c with RW and min assist. Discussed shower setup at home (positoining of chair, grab bars). Practiced simulated shower stall transfer with min assist. Pt returned to room and requesting to return to bed. Pt completed stand pivot transfer with min assist and supervision level for sit>supine. Pt left with all needs in reach.   Therapy Documentation Precautions:  Precautions Precautions: Fall Precaution Comments: wound vac Restrictions Weight Bearing Restrictions: No General:   Vital Signs: Therapy Vitals Temp: 97.6 F (36.4 C) Temp src: Oral Pulse Rate: 78 Resp: 20 BP: 110/50 mmHg Patient Position (if appropriate): Sitting Oxygen Therapy SpO2: 95 % O2 Device: None (Room air) Pain: Pain Assessment Pain Assessment: No/denies pain Pain Score: 6  Pain Type: Acute pain Pain Location: Leg Pain Orientation: Left Pain Descriptors / Indicators: Aching Pain Frequency: Intermittent Pain Onset: Gradual Patients Stated Pain Goal: 2 Pain Intervention(s): Medication (See eMAR)  See FIM for current functional status  Therapy/Group: Individual Therapy  [name] [name] 09/17/2013, 2:38 PM

## 2013-09-17 NOTE — Progress Notes (Signed)
[facility]     PROGRESS NOTE    Subjective/Complaints: Having a lot of pain. Worked really hard with therapy. Apparently surgery came by to see leg---(with vac)  Objective: Vital Signs: Blood pressure 115/54, pulse 75, temperature 98.4 F (36.9 C), temperature source Oral, resp. rate 20, weight 79.017 kg (174 lb 3.2 oz), SpO2 93.00%. No results found.  Recent Labs  09/15/13 0535 09/17/13 0500  WBC 6.9 6.7  HGB 7.9* 7.6*  HCT 26.5* 25.6*  PLT 418* 444*    Recent Labs  09/15/13 0535  NA 140  K 4.4  CL 103  GLUCOSE 115*  BUN 14  CREATININE 1.03  CALCIUM 8.8   CBG (last 3)   Recent Labs  09/16/13 1632 09/16/13 2118 09/17/13 0722  GLUCAP 113* 118* 137*    Wt Readings from Last 3 Encounters:  09/16/13 79.017 kg (174 lb 3.2 oz)  09/11/13 79.379 kg (175 lb)  09/11/13 79.379 kg (175 lb)    Physical Exam:  HENT: oral mucosa pink and moist  Head: Normocephalic.  Eyes: EOM are normal.  Neck: Normal range of motion. Neck supple. No thyromegaly present.  Cardiovascular: Normal rate and regular rhythm. No murmur  Respiratory: Effort normal and breath sounds normal. No respiratory distress. No wheezes or rales  GI: Soft. Bowel sounds are normal. He exhibits no distension.  Musculoskeletal: mild pain LUE with PROM today  Neurological: He is alert.  Speech is intelligible. He is oriented x3 and follows basic commands. Fair insight and awareness.  motor strength is 5/5 in the right deltoid, bicep, tricep, grip, hip flexors, knee extensors, ankle dorsiflexion  3 minus to 2+ in the left deltoid, bicep, tricep, and grip with increased tone in the finger and wrist flexors as well as elbow Ashworth grade 2/4  1+ to 2- in the left ankle dorsiflexor, plantar flexors, 3 minus to 2+ in the knee extensor and hip flexor.  Decreased sensation left hand and foot.  Skin: left leg dressed-vacuum sealed/intact. Left foot/toes with ischemia,  especially digit 3   Assessment/Plan: 1. Functional deficits secondary to deconditioning after thrombectomy, LLE BPG and fasciotomy which require 3+ hours per day of interdisciplinary therapy in a comprehensive inpatient rehab setting. Physiatrist is providing close team supervision and 24 hour management of active medical problems listed below. Physiatrist and rehab team continue to assess barriers to discharge/monitor patient progress toward functional and medical goals. FIM: FIM - Bathing Bathing Steps Patient Completed: Chest;Left Arm;Abdomen;Front perineal area;Right upper leg;Left upper leg Bathing: 0: Activity did not occur  FIM - Upper Body Dressing/Undressing Upper body dressing/undressing steps patient completed: Thread/unthread right sleeve of pullover shirt/dresss;Thread/unthread left sleeve of pullover shirt/dress;Put head through opening of pull over shirt/dress;Pull shirt over trunk Upper body dressing/undressing: 5: Supervision: Safety issues/verbal cues FIM - Lower Body Dressing/Undressing Lower body dressing/undressing: 1: Total-Patient completed less than 25% of tasks  FIM - Toileting Toileting: 0: Activity did not occur  FIM - Radio producer Devices: Bedside commode;Cane Toilet Transfers: 4-To toilet/BSC: Min A (steadying Pt. > 75%);4-From toilet/BSC: Min A (steadying Pt. > 75%)  FIM - Bed/Chair Transfer Bed/Chair Transfer Assistive Devices: Arm rests;Walker Bed/Chair Transfer: 4: Supine > Sit: Min A (steadying Pt. > 75%/lift 1 leg);4: Bed > Chair or W/C: Min A (steadying Pt. > 75%)  FIM - Locomotion: Wheelchair Distance: 120 Locomotion: Wheelchair: 2: Travels 50 - 149 ft with supervision, cueing or coaxing FIM - Locomotion: Ambulation Locomotion: Ambulation Assistive Devices: [employer] -  Straight;Walker - Rolling;Orthosis (RW with hand splint on L) Ambulation/Gait Assistance: 1: +2 Total assist;4: Min assist;3: Mod assist Locomotion:  Ambulation: 1: Two helpers (+2 needed for wound vac management & w/c follow)  Comprehension Comprehension Mode: Auditory Comprehension: 6-Follows complex conversation/direction: With extra time/assistive device  Expression Expression Mode: Verbal Expression: 5-Expresses basic needs/ideas: With no assist  Social Interaction Social Interaction: 6-Interacts appropriately with others with medication or extra time (anti-anxiety, antidepressant).  Problem Solving Problem Solving: 5-Solves basic problems: With no assist  Memory Memory: 6-More than reasonable amt of time  Medical Problem List and Plan:  1. Functional deficits secondary to Deconditioning after thrombectomy of occluded femoropopliteal bypass as well as fasciotomy requiring wound vac in a patient with chronic left hemiparesis   -await surgical plan 2. DVT Prophylaxis/Anticoagulation: Pharmaceutical: Lovenox  3. Pain Management: increase oxycontin to 20mg --continue prn oxycodone. Continue Neurontin tid.  4. Mood: Provide ego support. LCSW to follow for evaluation and support.  5. Neuropsych: This patient is capable of making decisions on his own behalf.  6. HTN: Will monitor every 8 hours--has been on the low side. Continue Prinivil daily and adjust as needed  7. DM type 2: Will monitor BS with ac/hs checks. Continue metformin and levemir daily with SSI for elevated BS---controlled relatively well so far 8. H/o CVA with left hemiparesis: On aggrenox bid for stroke prevention.  9. Anal cancer with liver mets/chronic intermittent constipation: Continue meslamine. Will augment bowel program due to constipation issues.  10. H/o chronic groin wound: continue on IV Ancef for wound prophylaxis  (09/11/13). 11. Anemia: 7.6 today----follow serially, post-operative  LOS (Days) 3 A FACE TO FACE EVALUATION WAS PERFORMED  Meredith Staggers 09/17/2013 7:49 AM

## 2013-09-17 NOTE — Progress Notes (Signed)
Physical Therapy Session Note  Patient Details  Name: Donald [name] MRN: [id number] Date of Birth: [birth year]  Today's Date: 09/17/2013 Time: 1030-1130 Time Calculation (min): 60 min  Short Term Goals: Week 1:  PT Short Term Goal 1 (Week 1): Pt will be able to demonstrate dynamic standing balance during functional task with steady A PT Short Term Goal 2 (Week 1): Pt will be able to gait x 50' with min A PT Short Term Goal 3 (Week 1): Pt will be able to go up/down 1 step for home entry with min A PT Short Term Goal 4 (Week 1): Pt will be able to propel w/c x 60' with S  Skilled Therapeutic Interventions/Progress Updates:    Session focused on functional transfers with emphasis on safety (locking breaks, controlling descent for stand >sit, and managing L hand with splint), seated and standing therex to tolerance for functional strengthening to aid with mobility (hip abduction, marches, LAQ, and mini squats), dynamic standing balance activity with RW for support (non-compliant and compliant surface) for horseshoe toss with overall min A except 1 episode of LOB requiring mod A, and gait training with RW x 30' x 25' with min A (+2 needed for w/c follow and wound vac management).  Therapy Documentation Precautions:  Precautions Precautions: Fall Precaution Comments: wound vac Restrictions Weight Bearing Restrictions: No Pain: Reports premedicated for pain in BLE.Rest breaks as needed. Locomotion : Ambulation Ambulation/Gait Assistance: 1: +2 Total assist;4: Min assist   See FIM for current functional status  Therapy/Group: Individual Therapy  Lars Masson 09/17/2013, 12:04 PM

## 2013-09-18 ENCOUNTER — Inpatient Hospital Stay (HOSPITAL_COMMUNITY): Payer: Medicare Other

## 2013-09-18 ENCOUNTER — Inpatient Hospital Stay (HOSPITAL_COMMUNITY): Payer: Medicare Other | Admitting: Occupational Therapy

## 2013-09-18 ENCOUNTER — Encounter (HOSPITAL_COMMUNITY): Payer: Medicare Other | Admitting: Occupational Therapy

## 2013-09-18 LAB — GLUCOSE, CAPILLARY
GLUCOSE-CAPILLARY: 100 mg/dL — AB (ref 70–99)
GLUCOSE-CAPILLARY: 109 mg/dL — AB (ref 70–99)
Glucose-Capillary: 128 mg/dL — ABNORMAL HIGH (ref 70–99)
Glucose-Capillary: 133 mg/dL — ABNORMAL HIGH (ref 70–99)

## 2013-09-18 NOTE — Progress Notes (Signed)
Physical Therapy Session Note  Patient Details  Name: Donald [name] MRN: [id number] Date of Birth: January 04, 1946  Today's Date: 09/18/2013 Time: 5852-7782 Time Calculation (min): 44 min  Short Term Goals: Week 1:  PT Short Term Goal 1 (Week 1): Pt will be able to demonstrate dynamic standing balance during functional task with steady A PT Short Term Goal 2 (Week 1): Pt will be able to gait x 50' with min A PT Short Term Goal 3 (Week 1): Pt will be able to go up/down 1 step for home entry with min A PT Short Term Goal 4 (Week 1): Pt will be able to propel w/c x 63' with S  Skilled Therapeutic Interventions/Progress Updates:    Session focused on functional transfers and sit to stands with emphasis on pt controlling descent (x 8 reps), w/c propulsion for functional strengthening and endurance (RUE/RLE), and gait training with RW with min A (placed wound vac in walker bag to increase independence). Pt reports feeling better today and "having a better attitude."  Therapy Documentation Precautions:  Precautions Precautions: Fall Precaution Comments: wound vac Restrictions Weight Bearing Restrictions: No Pain: Premedicated for pain in L leg. Locomotion : Ambulation Ambulation/Gait Assistance: 4: Min assist   See FIM for current functional status  Therapy/Group: Individual Therapy  Lars Masson 09/18/2013, 12:25 PM

## 2013-09-18 NOTE — Progress Notes (Signed)
Physical Therapy Note  Patient Details  Name: Donald [name] MRN: [id number] Date of Birth: 10-07-1945 Today's Date: 09/18/2013  Pt missed 60 min of skilled PT due to diarrhea and pt reporting not feeling well. Pt visually upset that he couldn't do this session, emotional support provided. RN aware.   Lars Masson 09/18/2013, 3:13 PM

## 2013-09-18 NOTE — Progress Notes (Signed)
Occupational Therapy Session Note  Patient Details  Name: Donald [name] MRN: [id number] Date of Birth: Sep 06, 1945  Today's Date: 09/18/2013 Time: 5597-4163 Time Calculation (min): 27 min  Short Term Goals: Week 1:  OT Short Term Goal 1 (Week 1): Short Term Goals = Long Term Goals due to ELOS  Skilled Therapeutic Interventions/Progress Updates:    Pt went down to the gym via wheelchair and transferred to the right sidelying on the mat.  Min assist for transfer from the chair and supervision for sit to supine.  Performed joint mobilizations to the left scapula in sidelying with emphasis on depression and adduction as pt sits with rounded shoulders with scapula abducted and elevated.  Also performed gently stretching to pectoral and internal rotators while stabilizing the shoulder girdle.  Pt with increased pain during shoulder flexion greater than 120 degrees and with abduction with slight external rotation.  Pt reporting his shoulder feeling better after mobs and stretching.  Returned to wheelchair with min assist and then back to his recliner with the LUE positioned on pillows.    Therapy Documentation Precautions:  Precautions Precautions: Fall Precaution Comments: wound vac Restrictions Weight Bearing Restrictions: No  Pain: Pain Assessment Pain Assessment: Faces Pain Score: 3  Faces Pain Scale: Hurts a little bit Pain Type: Chronic pain Pain Location: Shoulder Pain Orientation: Left Pain Descriptors / Indicators: Aching Pain Onset: On-going Patients Stated Pain Goal: 5 Pain Intervention(s): Repositioned ADL: See FIM for current functional status  Therapy/Group: Individual Therapy  Cindra Presume  OTR/L 09/18/2013, 4:15 PM

## 2013-09-18 NOTE — Progress Notes (Signed)
[facility] PHYSICAL MEDICINE & REHABILITATION     PROGRESS NOTE    Subjective/Complaints: Pain a little better. Left leg still feels pretty raw.   Objective: Vital Signs: Blood pressure 105/43, pulse 69, temperature 97.5 F (36.4 C), temperature source Oral, resp. rate 20, weight 79.017 kg (174 lb 3.2 oz), SpO2 98.00%. No results found.  Recent Labs  09/17/13 0500  WBC 6.7  HGB 7.6*  HCT 25.6*  PLT 444*   No results found for this basename: NA, K, CL, CO, GLUCOSE, BUN, CREATININE, CALCIUM,  in the last 72 hours CBG (last 3)   Recent Labs  09/17/13 1648 09/17/13 2113 09/18/13 0724  GLUCAP 101* 127* 128*    Wt Readings from Last 3 Encounters:  09/16/13 79.017 kg (174 lb 3.2 oz)  09/11/13 79.379 kg (175 lb)  09/11/13 79.379 kg (175 lb)    Physical Exam:  HENT: oral mucosa pink and moist  Head: Normocephalic.  Eyes: EOM are normal.  Neck: Normal range of motion. Neck supple. No thyromegaly present.  Cardiovascular: Normal rate and regular rhythm. No murmur  Respiratory: Effort normal and breath sounds normal. No respiratory distress. No wheezes or rales  GI: Soft. Bowel sounds are normal. He exhibits no distension.  Musculoskeletal: mild pain LUE with PROM today  Neurological: He is alert.  Speech is intelligible. He is oriented x3 and follows basic commands. Fair insight and awareness.  motor strength is 5/5 in the right deltoid, bicep, tricep, grip, hip flexors, knee extensors, ankle dorsiflexion  3 minus to 2+ in the left deltoid, bicep, tricep, and grip with increased tone in the finger and wrist flexors as well as elbow Ashworth grade 2/4  1+ to 2- in the left ankle dorsiflexor, plantar flexors, 3 minus to 2+ in the knee extensor and hip flexor.  Decreased sensation left hand and foot.  Skin: left leg area with pink granulation (vac site). Incision clean. Toes still with ischemia distally (stable)   Assessment/Plan: 1. Functional deficits secondary to  deconditioning after thrombectomy, LLE BPG and fasciotomy which require 3+ hours per day of interdisciplinary therapy in a comprehensive inpatient rehab setting. Physiatrist is providing close team supervision and 24 hour management of active medical problems listed below. Physiatrist and rehab team continue to assess barriers to discharge/monitor patient progress toward functional and medical goals. FIM: FIM - Bathing Bathing Steps Patient Completed: Chest;Left Arm;Abdomen;Front perineal area;Right upper leg;Left upper leg;Buttocks;Right lower leg (including foot) Bathing: 4: Min-Patient completes 8-9 72f 10 parts or 75+ percent  FIM - Upper Body Dressing/Undressing Upper body dressing/undressing steps patient completed: Thread/unthread right sleeve of pullover shirt/dresss;Thread/unthread left sleeve of pullover shirt/dress;Put head through opening of pull over shirt/dress;Pull shirt over trunk Upper body dressing/undressing: 5: Supervision: Safety issues/verbal cues FIM - Lower Body Dressing/Undressing Lower body dressing/undressing steps patient completed: Thread/unthread right pants leg;Pull pants up/down;Don/Doff right sock;Don/Doff right shoe;Fasten/unfasten right shoe Lower body dressing/undressing: 3: Mod-Patient completed 50-74% of tasks  FIM - Toileting Toileting steps completed by patient: Performs perineal hygiene Toileting: 2: Max-Patient completed 1 of 3 steps (assist with clothing per NT report)  FIM - Radio producer Devices: Bedside commode;Cane Toilet Transfers: 4-To toilet/BSC: Min A (steadying Pt. > 75%);4-From toilet/BSC: Min A (steadying Pt. > 75%)  FIM - Control and instrumentation engineer Devices: Walker;Arm rests;Bed rails;Orthosis (L hand orthosis) Bed/Chair Transfer: 4: Bed > Chair or W/C: Min A (steadying Pt. > 75%);5: Supine > Sit: Supervision (verbal cues/safety issues)  FIM - Locomotion: Wheelchair Distance:  120 Locomotion: Wheelchair: 1: Total Assistance/staff pushes wheelchair (Pt<25%) FIM - Locomotion: Ambulation Locomotion: Ambulation Assistive Devices: Walker - Rolling;Orthosis (hand splint) Ambulation/Gait Assistance: 1: +2 Total assist;4: Min assist Locomotion: Ambulation: 0: Activity did not occur  Comprehension Comprehension Mode: Auditory Comprehension: 6-Follows complex conversation/direction: With extra time/assistive device  Expression Expression Mode: Verbal Expression: 5-Expresses basic needs/ideas: With no assist  Social Interaction Social Interaction: 6-Interacts appropriately with others with medication or extra time (anti-anxiety, antidepressant).  Problem Solving Problem Solving: 5-Solves basic problems: With no assist  Memory Memory: 6-More than reasonable amt of time  Medical Problem List and Plan:  1. Functional deficits secondary to Deconditioning after thrombectomy of occluded femoropopliteal bypass as well as fasciotomy requiring wound vac in a patient with chronic left hemiparesis   -surgical plan will be to maintain vac and salvage leg as possible---area looks good right now. 2. DVT Prophylaxis/Anticoagulation: Pharmaceutical: Lovenox  3. Pain Management: increased oxycontin to 20mg  which helped  --continue prn oxycodone. Continue Neurontin tid.  4. Mood: Provide ego support. LCSW to follow for evaluation and support.  5. Neuropsych: This patient is capable of making decisions on his own behalf.  6. HTN: Will monitor every 8 hours--has been on the low side. Continue Prinivil daily and adjust as needed  7. DM type 2: Will monitor BS with ac/hs checks. Continue metformin and levemir daily with SSI for elevated BS---controlled relatively well   8. H/o CVA with left hemiparesis: On aggrenox bid for stroke prevention.  9. Anal cancer with liver mets/chronic intermittent constipation: Continue meslamine.  10. H/o chronic groin wound: continue on IV Ancef for  wound prophylaxis  (09/11/13). 11. Anemia: 7.6 again today----recheck next week.  LOS (Days) 4 A FACE TO FACE EVALUATION WAS PERFORMED  Meredith Staggers 09/18/2013 7:51 AM

## 2013-09-18 NOTE — Progress Notes (Signed)
Physical Therapy Session Note  Patient Details  Name: Donald [name] MRN: [id number] Date of Birth: 1945-07-07  Today's Date: 09/17/2013 Time:  7062-3762 Time Calculation: 38 min  Short Term Goals: Week 1:  PT Short Term Goal 1 (Week 1): Pt will be able to demonstrate dynamic standing balance during functional task with steady A PT Short Term Goal 2 (Week 1): Pt will be able to gait x 50' with min A PT Short Term Goal 3 (Week 1): Pt will be able to go up/down 1 step for home entry with min A PT Short Term Goal 4 (Week 1): Pt will be able to propel w/c x 3' with S  Skilled Therapeutic Interventions/Progress Updates:    Pt received supine in bed with wife present; agreeable to therapy. Reporting no pain in supine but increasingly more pain with weight bearing and with LLE in dependent position. Session focused on increasing safety awareness with functional transfers. Performed stand pivot from bed<>w/c with rolling walker, L hand orthosis requiring min A, mod cueing for safety awareness (speed, hand placement). Pt with effective within-session carryover of cueing, as demonstrated by stand pivot car transfer with rolling walker and close supervision (assist with management of IV and wound vac), safe hand placement, and improved safety awareness. Session ended in pt room, where pt was left seated EOB eating dinner; wife present and all needs within reach.  Therapy Documentation Precautions:  Precautions Precautions: Fall Precaution Comments: wound vac Restrictions Weight Bearing Restrictions: No Vital Signs: Therapy Vitals Temp: 97.5 F (36.4 C) Temp src: Oral Pulse Rate: 69 Resp: 20 BP: 105/43 mmHg (RN notified. Did not ask for a recheck) Patient Position (if appropriate): Lying Oxygen Therapy SpO2: 98 % O2 Device: None (Room air) Pain: Pain Assessment Pain Assessment: No/denies pain Pain Intervention(s): Medication (See eMAR) (scheduled)  See FIM for current functional  status  Therapy/Group: Individual Therapy  Malva Cogan Hobble 09/18/2013, 6:38 AM

## 2013-09-18 NOTE — Progress Notes (Signed)
Occupational Therapy Session Note  Patient Details  Name: Donald [name] MRN: [id number] Date of Birth: 1945/09/07  Today's Date: 09/18/2013 Time: 1572-6203 Time Calculation (min): 58 min  Short Term Goals: Week 1:  OT Short Term Goal 1 (Week 1): Short Term Goals = Long Term Goals due to ELOS  Skilled Therapeutic Interventions/Progress Updates:    Pt performed toileting transfer to begin session with min assist level using the RW.  He was able to perform hygiene and clothing management in standing. Pt reporting increased pain in his LEs so nursing notified and meds brought in by nursing.  Pt transitioned to the wheelchair with min assist for bathing at the sink.  Pt utilized the reacher for doffing his shorts and doffing shoes after therapist assisted with velcro fasteners.  Pt needed min assist for sit to stand as well as for washing the RUE.  He was able to donn his pullover shirt with supervision but needs assist with donning the left side of his pants, as well as the left sock and shoe.  Noted pt with increased anxiety this session secondary to pain in his LEs.  Transferred to recliner at end of session with min assist.   Therapy Documentation Precautions:  Precautions Precautions: Fall Precaution Comments: wound vac Restrictions Weight Bearing Restrictions: No  Pain: Pain Assessment Pain Assessment: Faces Pain Score: 2  Faces Pain Scale: Hurts whole lot Pain Type: Acute pain Pain Location: Leg Pain Orientation: Right;Left Pain Descriptors / Indicators: Constant Pain Frequency: Constant Pain Onset: Gradual Patients Stated Pain Goal: 2 Pain Intervention(s): Medication (See eMAR);Repositioned;Emotional support ADL: See FIM for current functional status  Therapy/Group: Individual Therapy  Cindra Presume OTR/L 09/18/2013, 12:46 PM

## 2013-09-18 NOTE — Progress Notes (Signed)
   VASCULAR PROGRESS NOTE  SUBJECTIVE: Comfortable  PHYSICAL EXAM: Filed Vitals:   09/17/13 2127 09/17/13 2136 09/18/13 0612 09/18/13 0807  BP: 91/56 110/48 105/43 100/52  Pulse: 74  69   Temp:   97.5 F (36.4 C)   TempSrc:   Oral   Resp:   20   Weight:      SpO2:   98%    Fasciotomy site is stable  LABS: Lab Results  Component Value Date   WBC 6.7 09/17/2013   HGB 7.6* 09/17/2013   HCT 25.6* 09/17/2013   MCV 78.5 09/17/2013   PLT 444* 09/17/2013   Lab Results  Component Value Date   CREATININE 1.03 09/15/2013   Lab Results  Component Value Date   INR 1.14 09/05/2013   CBG (last 3)   Recent Labs  09/17/13 1648 09/17/13 2113 09/18/13 0724  GLUCAP 101* 127* 128*    Active Problems:   Ischemic leg   ASSESSMENT AND PLAN:  * Continue VAC  * If it fails to heal, he would need an AKA.  Gae Gallop Beeper: [phone] 09/18/2013

## 2013-09-18 NOTE — Consult Note (Signed)
WOC wound follow up Wound type:  Full thickness surgical wound Wound bed: beefy red, clean, moist, less edematous  Drainage (amount, consistency, odor) moderate amounts serosanguinous in canister Periwound: intact, erythema improved.  Continues to have discoloration of the toe tips.  Dressing procedure/placement/frequency: 1pc black granufoam used to cover the wound bed, protected the muscle with Mepitel.  Seal at 13mmHG, pt tolerated well. Premedicated with PO pain meds.   Dr. Scot Dock at the bedside for assessment of the wound.   Teal Bontrager [place] RN,CWOCN [phone]

## 2013-09-19 ENCOUNTER — Inpatient Hospital Stay (HOSPITAL_COMMUNITY): Payer: Medicare Other

## 2013-09-19 ENCOUNTER — Inpatient Hospital Stay (HOSPITAL_COMMUNITY): Payer: Medicare Other | Admitting: *Deleted

## 2013-09-19 DIAGNOSIS — I739 Peripheral vascular disease, unspecified: Secondary | ICD-10-CM

## 2013-09-19 DIAGNOSIS — I999 Unspecified disorder of circulatory system: Secondary | ICD-10-CM

## 2013-09-19 DIAGNOSIS — R5381 Other malaise: Secondary | ICD-10-CM

## 2013-09-19 LAB — GLUCOSE, CAPILLARY
GLUCOSE-CAPILLARY: 105 mg/dL — AB (ref 70–99)
Glucose-Capillary: 104 mg/dL — ABNORMAL HIGH (ref 70–99)
Glucose-Capillary: 151 mg/dL — ABNORMAL HIGH (ref 70–99)
Glucose-Capillary: 106 mg/dL — ABNORMAL HIGH (ref 70–99)

## 2013-09-19 MED ORDER — ALTEPLASE 2 MG IJ SOLR
2.0000 mg | Freq: Once | INTRAMUSCULAR | Status: AC
Start: 2013-09-19 — End: 2013-09-19
  Administered 2013-09-19: 2 mg
  Filled 2013-09-19: qty 2

## 2013-09-19 NOTE — Progress Notes (Signed)
Occupational Therapy Session Note  Patient Details  Name: Donald [name] MRN: [id number] Date of Birth: [birth year]  Today's Date: 09/19/2013 Time: -  1030-1100   1st session Time Calculation (min): 30 min  Short Term Goals: Week 1:  OT Short Term Goal 1 (Week 1): Short Term Goals = Long Term Goals due to ELOS Week 2:     Skilled Therapeutic Interventions/Progress Updates:     1st session:   Performed joint mobilizations to the left scapula in sitting with emphasis on depression and adduction  Pt sits with rounded shoulders and scapula abducted and elevated.  Performed gently stretching to pectoral and internal rotators while stabilizing the shoulder girdle. Pt reporting his shoulder feeling better after mobs and stretching  2nd session:  Time:  1530-1630   (60 min) Pain:Left sho is sore but no pain Individual session Pt. Sitting in gym finishing with last PT session.  Transferred to mat with mod assist and facilitation at hip for transitional moves.  Decreased eccentric control when sitting.  Positioned on mat .  Addressed scapular mobilization with movements in depression, abduction, adduction.  Pt. Has tingling in his left arm and leg all the time.  Provided some myotherapy and closed chain activities with pt putting weight through LUE.  Sat on EOB and engaged in postural alignment with cervical and spine.  Pt sits with rounded posture and neck extension.  Facilitation proviided for neck retraction.  Unable to mobilize cervical area to achieve better alignment.  Transferred back to wc and then to recliner.  Wife and friend in room.     Therapy Documentation Precautions:  Precautions Precautions: Fall Precaution Comments: wound vac Restrictions Weight Bearing Restrictions: No General:     Pain: Pain Assessment Pain Assessment: 0-10 Pain Score: 2  Pain Type: Acute pain Pain Location: Leg Pain Orientation: Left Pain Descriptors / Indicators: Burning Pain Onset:  On-going Patients Stated Pain Goal: 2 Pain Intervention(s): Medication (See eMAR)         See FIM for current functional status  Therapy/Group: Individual Therapy  Lisa Roca 09/19/2013, 11:11 AM

## 2013-09-19 NOTE — Progress Notes (Signed)
Patient ID: MILLER LIMEHOUSE, male   DOB: Dec 01, 1945, 68 y.o.   MRN: [id number]   [place] PHYSICAL MEDICINE & REHABILITATION     PROGRESS NOTE   09/19/13. Subjective/Complaints:  68 year old patient admitted to rehabilitation with functional deficits secondary to left lower extremity BPG thrombectomy and fasciotomy.  Chronic medical problems include diabetes, CAD, and cerebrovascular disease  Pain a little better. Left leg still feels pretty raw.  Main complaint today is some diarrhea.  Remains on IV Ancef  Objective: Vital Signs: Blood pressure 103/42, pulse 73, temperature 97.7 F (36.5 C), temperature source Oral, resp. rate 18, weight 174 lb 3.2 oz (79.017 kg), SpO2 94.00%. No results found.  Recent Labs  09/17/13 0500  WBC 6.7  HGB 7.6*  HCT 25.6*  PLT 444*   No results found for this basename: NA, K, CL, CO, GLUCOSE, BUN, CREATININE, CALCIUM,  in the last 72 hours CBG (last 3)   Recent Labs  09/18/13 1639 09/18/13 2031 09/19/13 0726  GLUCAP 133* 109* 105*    Wt Readings from Last 3 Encounters:  09/16/13 174 lb 3.2 oz (79.017 kg)  09/11/13 175 lb (79.379 kg)  09/11/13 175 lb (79.379 kg)   Patient Vitals for the past 24 hrs:  BP Temp Temp src Pulse Resp SpO2  09/19/13 0622 103/42 mmHg 97.7 F (36.5 C) Oral 73 18 94 %  09/18/13 2125 130/63 mmHg 98.1 F (36.7 C) Oral 89 18 95 %  09/18/13 1453 109/56 mmHg 98.3 F (36.8 C) Oral 64 18 100 %    Intake/Output Summary (Last 24 hours) at 09/19/13 0836 Last data filed at 09/19/13 0800  Gross per 24 hour  Intake    660 ml  Output    300 ml  Net    360 ml   Physical Exam:  HENT: oral mucosa pink and moist  Head: Normocephalic.  Eyes: EOM are normal.  Neck: Normal range of motion. Neck supple. No thyromegaly present.  Cardiovascular: Normal rate and regular rhythm. No murmur  Respiratory: Effort normal and breath sounds normal. No respiratory distress. No wheezes or rales  GI: Soft. Bowel sounds are normal.  He exhibits no distension.  Musculoskeletal: mild pain LUE with PROM today  Neurological: He is alert.  Left-sided weakness   Skin: left leg area with pink granulation (vac site). Incision clean. Toes still with ischemia distally (stable); wound VAC in place   Assessment/Plan:   Medical Problem List and Plan:  1. Functional deficits secondary to Deconditioning after thrombectomy of occluded femoropopliteal bypass as well as fasciotomy requiring wound vac in a patient with chronic left hemiparesis   -surgical plan will be to maintain vac and salvage leg as possible---area looks good right now. 2. DVT Prophylaxis/Anticoagulation: Pharmaceutical: Lovenox  3. Pain Management: increased oxycontin to 20mg  which helped  --continue prn oxycodone. Continue Neurontin tid.  4. HTN: Will monitor every 8 hours--has been on the low side. Continue Prinivil daily and adjust as needed  5.  DM type 2: Will monitor BS with ac/hs checks. Continue metformin and levemir daily with SSI for elevated BS---controlled relatively well   6. H/o CVA with left hemiparesis: On aggrenox bid for stroke prevention.  7.  Anal cancer with liver mets/chronic intermittent constipation: CoYesterdayntinue meslamine.  8. H/o chronic groin wound: continue on IV Ancef for wound prophylaxis  (09/11/13). 9.  Anemia: 7.6 ----recheck next week.  LOS (Days) 5 A FACE TO FACE EVALUATION WAS PERFORMED  Marletta Lor 09/19/2013 8:33 AM

## 2013-09-19 NOTE — Progress Notes (Addendum)
Physical Therapy Session Note  Patient Details  Name: Donald [name] MRN: [id number] Date of Birth: 1945-08-16  Today's Date: 09/19/2013 Time: 0800-0840 and 1500-1530 Time Calculation (min): 40 min and 30 mi n  Short Term Goals: Week 1:  PT Short Term Goal 1 (Week 1): Pt will be able to demonstrate dynamic standing balance during functional task with steady A PT Short Term Goal 2 (Week 1): Pt will be able to gait x 50' with min A PT Short Term Goal 3 (Week 1): Pt will be able to go up/down 1 step for home entry with min A PT Short Term Goal 4 (Week 1): Pt will be able to propel w/c x 28' with S  Skilled Therapeutic Interventions/Progress Updates:  1: Pt received supine in bed and agreeable to therapy. Pt does report continued, intermittent diarrhea, ongoing from yesterday. PT instructed pt to request use of bathroom if needed. Pt  Transferred from bed to w/c with min A and cues for hand placement/safety. Pt propelled wheel chair in hallways 150 feet using R hemi technique, requiring 3 short rest breaks due to fatigue and pain. Pt reports burning pain in the RLE/quads. Session focused on gait training, short distances with pt ambulating 45 feet x2 with using a RW and L hemi grip and min A. Pt fatigues quickly, reports increased pain in the RLE with weight bearing. Pt also instructed in negotiation of curb step with use of RW and extra assistance due to wound vac. Pt request to return to room secondary to intolerable pain in RLE and feeling of stomach upset. Pt returned to room and transferred to recliner chair per pt request. Pt did not feel need to use the bathroom upon returning to room. Nsg notified. Pt positioned safely in recliner and all needs met, within reach.   2: Pt received sitting EOB after IV team completed assessment and treatment. [name] present in room and pt agreeable to treatment. Pt completed squat pivot tsf from bed to w/c with min A and cues for safety. PT propelled pt in  w/c to gym. Pt ambulated 55 feet x2 and 30 feet x1 with RW and min A, cues for pacing and safety. Pt reports minimal pain this pm in BLE, L >R. Pt completed seated there ex including heel raises, toe raises, LAQ, marching, hip abd and hamstring curls (RLE only). 2x15, yellow theraband added for resistance during hip abd and hamstring curls. Pt left in care of OT for next therapy session.   Therapy Documentation Precautions:  Precautions Precautions: Fall Precaution Comments: wound vac Restrictions Weight Bearing Restrictions: No General: Amount of Missed PT Time (min): 20 Minutes Missed Time Reason: Patient ill (comment);Pain (Pt reports intolerable pain in the RLE 10/10 with increased weight bearing and shooting pain in the LLE in gravity dependent position. Pt also with continued diarrhea this am. ) Vital Signs: Therapy Vitals Temp: 97.7 F (36.5 C) Temp src: Oral Pulse Rate: 73 Resp: 18 BP: 103/42 mmHg Patient Position (if appropriate): Lying Oxygen Therapy SpO2: 94 % O2 Device: None (Room air) Pain: Pain Assessment Pain Assessment: 0-10 Pain Score: 10-Worst pain ever Pain Type: Acute pain Pain Location: Leg Pain Orientation: Left Pain Descriptors / Indicators: Burning Pain Onset: On-going Patients Stated Pain Goal: 2 Pain Intervention(s): Repositioned;Emotional support (Pt reports receiving pain medicaiton earlier this am at 6:30 am)   See FIM for current functional status  Therapy/Group: Individual Therapy  Adya Wirz R Skilar Marcou 09/19/2013, 8:54 AM

## 2013-09-20 ENCOUNTER — Inpatient Hospital Stay (HOSPITAL_COMMUNITY): Payer: Medicare Other | Admitting: Physical Therapy

## 2013-09-20 LAB — GLUCOSE, CAPILLARY
GLUCOSE-CAPILLARY: 108 mg/dL — AB (ref 70–99)
GLUCOSE-CAPILLARY: 133 mg/dL — AB (ref 70–99)
GLUCOSE-CAPILLARY: 128 mg/dL — AB (ref 70–99)
Glucose-Capillary: 103 mg/dL — ABNORMAL HIGH (ref 70–99)

## 2013-09-20 NOTE — Progress Notes (Addendum)
Physical Therapy Session Note  Patient Details  Name: Donald [name] MRN: [id number] Date of Birth: 01/20/1946  Today's Date: 09/20/2013 Time: 1130-1200 Time Calculation (min): 30 min  Short Term Goals: Week 1:  PT Short Term Goal 1 (Week 1): Pt will be able to demonstrate dynamic standing balance during functional task with steady A PT Short Term Goal 2 (Week 1): Pt will be able to gait x 50' with min A PT Short Term Goal 3 (Week 1): Pt will be able to go up/down 1 step for home entry with min A PT Short Term Goal 4 (Week 1): Pt will be able to propel w/c x 51' with S  Skilled Therapeutic Interventions/Progress Updates:   Session focused on safety with functional mobility. Pt c/o burning/stabbing pain (unrated) in LLE, premedicated prior to therapy. Pt received sitting in recliner, wife present. Squat pivot transfer recliner <> w/c with supervision/vc's for safety and assist for managing IV and wound vac lines. W/c propulsion using R hemi technique 2 x 100 ft with supervision and assist for IV management. Gait training x 25 ft and x 35 ft using RW with L hand orthosis and min guard with +2 assist for W/c follow for safety and IV management, wound vac in walker bag for increased independence. Pt reported feeling as though his legs were about to give out during second trial. For LE strengthening, kinetron from w/c level 2 x 3 min. Pt returned to room and left sitting in recliner with all needs within reach and wife present.   Therapy Documentation Precautions:  Precautions Precautions: Fall Precaution Comments: wound vac Restrictions Weight Bearing Restrictions: No  See FIM for current functional status  Therapy/Group: Individual Therapy  Laretta Alstrom 09/20/2013, 12:12 PM

## 2013-09-20 NOTE — Progress Notes (Signed)
Patient ID: Donald [name], male   DOB: May 30, 1945, 68 y.o.   MRN: [id number]  Patient ID: Donald [name], male   DOB: 12-02-1945, 68 y.o.   MRN: [id number]   [facility]     PROGRESS NOTE   09/20/13. Subjective/Complaints:  68 year old patient admitted to rehabilitation with functional deficits secondary to left lower extremity BPG thrombectomy and fasciotomy.  Chronic medical problems include diabetes, CAD, and cerebrovascular disease  Pain a little better.  Main complaint today is right leg discomfort following  rehabilitation session.  Diarrhea has resolved.  Remains on IV Ancef  Objective: Vital Signs: Blood pressure 93/56, pulse 77, temperature 97.5 F (36.4 C), temperature source Oral, resp. rate 16, weight 174 lb 3.2 oz (79.017 kg), SpO2 94.00%. No results found. No results found for this basename: WBC, HGB, HCT, PLT,  in the last 72 hours No results found for this basename: NA, K, CL, CO, GLUCOSE, BUN, CREATININE, CALCIUM,  in the last 72 hours CBG (last 3)   Recent Labs  09/19/13 1631 09/19/13 2054 09/20/13 0731  GLUCAP 151* 106* 103*    Wt Readings from Last 3 Encounters:  09/16/13 174 lb 3.2 oz (79.017 kg)  09/11/13 175 lb (79.379 kg)  09/11/13 175 lb (79.379 kg)   Patient Vitals for the past 24 hrs:  BP Temp Temp src Pulse Resp SpO2  09/20/13 0549 93/56 mmHg 97.5 F (36.4 C) Oral 77 16 94 %  09/19/13 2240 116/55 mmHg 98.3 F (36.8 C) Oral 69 20 96 %  09/19/13 1300 108/48 mmHg 97.9 F (36.6 C) Oral 79 20 100 %    Intake/Output Summary (Last 24 hours) at 09/20/13 0840 Last data filed at 09/20/13 0549  Gross per 24 hour  Intake    360 ml  Output    700 ml  Net   -340 ml   Physical Exam:  HENT: oral mucosa pink and moist  Head: Normocephalic.  Eyes: EOM are normal.  Neck: Normal range of motion. Neck supple. No thyromegaly present.  Cardiovascular: Normal rate and regular rhythm. No murmur  Respiratory: Effort  normal and breath sounds normal. No respiratory distress. No wheezes or rales  GI: Soft. Bowel sounds are normal. He exhibits no distension.  Musculoskeletal: mild pain LUE with PROM today  Neurological: He is alert.  Left-sided weakness   Skin: left leg area with pink granulation (vac site). Incision clean. Toes still with ischemia distally (stable); wound VAC in place   Assessment/Plan:   Medical Problem List and Plan:  1. Functional deficits secondary to Deconditioning after thrombectomy of occluded femoropopliteal bypass as well as fasciotomy requiring wound vac in a patient with chronic left hemiparesis   -surgical plan will be to maintain vac and salvage leg as possible---area looks good right now. 2. DVT Prophylaxis/Anticoagulation: Pharmaceutical: Lovenox  3. Pain Management: increased oxycontin to 20mg  which helped  --continue prn oxycodone. Continue Neurontin tid.  4. HTN: Will monitor every 8 hours--has been on the low side. Continue Prinivil daily and adjust as needed  5.  DM type 2: Will monitor BS with ac/hs checks. Continue metformin and levemir daily with SSI for elevated BS---controlled relatively well   6. H/o CVA with left hemiparesis: On aggrenox bid for stroke prevention.  7.  Anal cancer with liver mets/chronic intermittent constipation: CoYesterdayntinue meslamine.  8. H/o chronic groin wound: continue on IV Ancef for wound prophylaxis  (09/11/13). 9.  Anemia: 7.6 ----recheck next week.  LOS (Days) 6  A FACE TO FACE EVALUATION WAS PERFORMED  Marletta Lor 09/20/2013 8:40 AM

## 2013-09-21 ENCOUNTER — Inpatient Hospital Stay (HOSPITAL_COMMUNITY): Payer: Medicare Other | Admitting: Occupational Therapy

## 2013-09-21 ENCOUNTER — Encounter (HOSPITAL_COMMUNITY): Payer: Medicare Other | Admitting: Occupational Therapy

## 2013-09-21 ENCOUNTER — Inpatient Hospital Stay (HOSPITAL_COMMUNITY): Payer: Medicare Other | Admitting: *Deleted

## 2013-09-21 ENCOUNTER — Inpatient Hospital Stay (HOSPITAL_COMMUNITY): Payer: Medicare Other

## 2013-09-21 LAB — GLUCOSE, CAPILLARY
GLUCOSE-CAPILLARY: 114 mg/dL — AB (ref 70–99)
GLUCOSE-CAPILLARY: 118 mg/dL — AB (ref 70–99)
Glucose-Capillary: 124 mg/dL — ABNORMAL HIGH (ref 70–99)
Glucose-Capillary: 107 mg/dL — ABNORMAL HIGH (ref 70–99)

## 2013-09-21 LAB — HEMOGLOBIN AND HEMATOCRIT, BLOOD
HEMATOCRIT: 25.2 % — AB (ref 39.0–52.0)
HEMOGLOBIN: 7.5 g/dL — AB (ref 13.0–17.0)

## 2013-09-21 NOTE — Progress Notes (Signed)
[facility]     PROGRESS NOTE    Subjective/Complaints: Pain still an issue in his left leg. Right hip sore now too.  A 12 point review of systems has been performed and if not noted above is otherwise negative.   Objective: Vital Signs: Blood pressure 110/57, pulse 71, temperature 97.6 F (36.4 C), temperature source Oral, resp. rate 18, weight 79.017 kg (174 lb 3.2 oz), SpO2 94.00%. No results found.  Recent Labs  09/21/13 0500  HGB 7.5*  HCT 25.2*   No results found for this basename: NA, K, CL, CO, GLUCOSE, BUN, CREATININE, CALCIUM,  in the last 72 hours CBG (last 3)   Recent Labs  09/20/13 1630 09/20/13 2107 09/21/13 0741  GLUCAP 133* 128* 124*    Wt Readings from Last 3 Encounters:  09/16/13 79.017 kg (174 lb 3.2 oz)  09/11/13 79.379 kg (175 lb)  09/11/13 79.379 kg (175 lb)    Physical Exam:  HENT: oral mucosa pink and moist  Head: Normocephalic.  Eyes: EOM are normal.  Neck: Normal range of motion. Neck supple. No thyromegaly present.  Cardiovascular: Normal rate and regular rhythm. No murmur  Respiratory: Effort normal and breath sounds normal. No respiratory distress. No wheezes or rales  GI: Soft. Bowel sounds are normal. He exhibits no distension.  Musculoskeletal: mild pain LUE with PROM today  Neurological: He is alert.  Speech is intelligible. He is oriented x3 and follows basic commands. Fair insight and awareness.  motor strength is 5/5 in the right deltoid, bicep, tricep, grip, hip flexors, knee extensors, ankle dorsiflexion  3 minus to 2+ in the left deltoid, bicep, tricep, and grip with increased tone in the finger and wrist flexors as well as elbow Ashworth grade 2/4  1+ to 2- in the left ankle dorsiflexor, plantar flexors, 3 minus to 2+ in the knee extensor and hip flexor.  Decreased sensation left hand and foot.  Skin: left leg area with pink granulation (vac site). Incision clean. Toes still with ischemia  distally (stable)   Assessment/Plan: 1. Functional deficits secondary to deconditioning after thrombectomy, LLE BPG and fasciotomy which require 3+ hours per day of interdisciplinary therapy in a comprehensive inpatient rehab setting. Physiatrist is providing close team supervision and 24 hour management of active medical problems listed below. Physiatrist and rehab team continue to assess barriers to discharge/monitor patient progress toward functional and medical goals. FIM: FIM - Bathing Bathing Steps Patient Completed:  (wife assist) Bathing: 4: Min-Patient completes 8-9 73f 10 parts or 75+ percent  FIM - Upper Body Dressing/Undressing Upper body dressing/undressing steps patient completed: Thread/unthread right sleeve of pullover shirt/dresss;Thread/unthread left sleeve of pullover shirt/dress;Put head through opening of pull over shirt/dress;Pull shirt over trunk Upper body dressing/undressing: 5: Supervision: Safety issues/verbal cues FIM - Lower Body Dressing/Undressing Lower body dressing/undressing steps patient completed: Thread/unthread right pants leg;Pull pants up/down;Don/Doff right sock;Don/Doff right shoe;Fasten/unfasten right shoe Lower body dressing/undressing: 3: Mod-Patient completed 50-74% of tasks  FIM - Toileting Toileting steps completed by patient: Adjust clothing prior to toileting;Performs perineal hygiene;Adjust clothing after toileting Toileting Assistive Devices: Grab bar or rail for support Toileting: 4: Steadying assist  FIM - Radio producer Devices: Nurse, learning disability Transfers: 4-To toilet/BSC: Min A (steadying Pt. > 75%);4-From toilet/BSC: Min A (steadying Pt. > 75%)  FIM - Bed/Chair Transfer Bed/Chair Transfer Assistive Devices: Arm rests Bed/Chair Transfer: 4: Bed > Chair or W/C: Min A (steadying Pt. > 75%);4: Chair or W/C > Bed:  Min A (steadying Pt. > 75%)  FIM - Locomotion: Wheelchair Distance:  100 Locomotion: Wheelchair: 2: Travels 24 - 149 ft with supervision, cueing or coaxing FIM - Locomotion: Ambulation Locomotion: Ambulation Assistive Devices: Walker - Rolling;Orthosis Ambulation/Gait Assistance: 1: +2 Total assist (for IV mgmt and w/c follow) Locomotion: Ambulation: 1: Two helpers  Comprehension Comprehension Mode: Auditory Comprehension: 6-Follows complex conversation/direction: With extra time/assistive device  Expression Expression Mode: Verbal Expression: 5-Expresses basic needs/ideas: With no assist  Social Interaction Social Interaction: 6-Interacts appropriately with others with medication or extra time (anti-anxiety, antidepressant).  Problem Solving Problem Solving: 5-Solves basic problems: With no assist  Memory Memory: 6-More than reasonable amt of time  Medical Problem List and Plan:  1. Functional deficits secondary to Deconditioning after thrombectomy of occluded femoropopliteal bypass as well as fasciotomy requiring wound vac in a patient with chronic left hemiparesis   -surgical plan will be to maintain vac and salvage leg as possible---area looks good right now. 2. DVT Prophylaxis/Anticoagulation: Pharmaceutical: Lovenox  3. Pain Management: increased oxycontin to 20mg  which helped  --continue prn oxycodone. Continue Neurontin tid.  4. Mood: Provide ego support. LCSW to follow for evaluation and support.  5. Neuropsych: This patient is capable of making decisions on his own behalf.  6. HTN: Will monitor every 8 hours--has been on the low side. Continue Prinivil daily and adjust as needed  7. DM type 2: Will monitor BS with ac/hs checks. Continue metformin and levemir daily with SSI for elevated BS---controlled relatively well   8. H/o CVA with left hemiparesis: On aggrenox bid for stroke prevention.  9. Anal cancer with liver mets/chronic intermittent constipation: Continue meslamine.  10. H/o chronic groin wound: continue on IV Ancef for wound  prophylaxis  (09/11/13). 11. Anemia: 7.5  today----recheck Wednesday   LOS (Days) 7 A FACE TO FACE EVALUATION WAS PERFORMED  Meredith Staggers 09/21/2013 8:34 AM

## 2013-09-21 NOTE — Progress Notes (Signed)
Occupational Therapy Session Note  Patient Details  Name: Donald [name] MRN: [id number] Date of Birth: 1946-02-18  Today's Date: 09/21/2013 Time: 3903-0092 Time Calculation (min): 45 min  Short Term Goals: Week 1:  OT Short Term Goal 1 (Week 1): Short Term Goals = Long Term Goals due to ELOS  Skilled Therapeutic Interventions/Progress Updates:    Pt performed bathing and dressing during session.  Pt reporting not feeling as well this session and feeling down on himself a little.  Therapist discussed how good he was actually doing and how he will be going home in a few days because of this.  Feel pt is down secondary to constant pain in his left leg.  He is able to perform all bathing sit to stand with min assist.  Min guard assist needed for all sit to stand in order to wash peri area and pull his pants over his hips.  He still needs assist with washing the right arm as well as dressing the left LE, but can do all other tasks.  On pace to meet LTGs.   Therapy Documentation Precautions:  Precautions Precautions: Fall Precaution Comments: wound vac Restrictions Weight Bearing Restrictions: No  Pain: Pain Assessment Pain Assessment: Faces Pain Score: 4  Faces Pain Scale: Hurts whole lot Pain Type: Acute pain Pain Location: Leg Pain Orientation: Left Pain Intervention(s): RN made aware;Repositioned;Emotional support ADL: See FIM for current functional status  Therapy/Group: Individual Therapy  Cindra Presume OTR/L 09/21/2013, 10:46 AM

## 2013-09-21 NOTE — Progress Notes (Signed)
Physical Therapy Session Note  Patient Details  Name: Donald [name] MRN: [id number] Date of Birth: 1946/03/02  Today's Date: 09/21/2013 Time: 4098-1191 Time Calculation (min): 43 min  Short Term Goals: Week 1:  PT Short Term Goal 1 (Week 1): Pt will be able to demonstrate dynamic standing balance during functional task with steady A PT Short Term Goal 2 (Week 1): Pt will be able to gait x 50' with min A PT Short Term Goal 3 (Week 1): Pt will be able to go up/down 1 step for home entry with min A PT Short Term Goal 4 (Week 1): Pt will be able to propel w/c x 55' with S  Skilled Therapeutic Interventions/Progress Updates:   Session focused on functional gait with RW (focus on increasing distance to simulate home mobility 20', x 20', x 40' with seated rest breaks), sit to stands with cues for controlling descent, curb step negotiation with RW (min A but second person needed to help manage wound vac) x 2 repetitions (cues also for which foot to lead with), and w/c mobility with S for endurance/strengthening. Pain limiting session.  Therapy Documentation Precautions:  Precautions Precautions: Fall Precaution Comments: wound vac Restrictions Weight Bearing Restrictions: No  Pain: C/o 10/10 pain in LLE with WB/mobility - premedicated.   See FIM for current functional status  Therapy/Group: Individual Therapy  Lars Masson 09/21/2013, 9:17 AM

## 2013-09-21 NOTE — Progress Notes (Signed)
Physical Therapy Session Note  Patient Details  Name: Donald [name] MRN: [id number] Date of Birth: March 10, 1946  Today's Date: 09/21/2013 Time: 8185-6314 Time Calculation (min): 53 min  Short Term Goals: Week 1:  PT Short Term Goal 1 (Week 1): Pt will be able to demonstrate dynamic standing balance during functional task with steady A PT Short Term Goal 2 (Week 1): Pt will be able to gait x 50' with min A PT Short Term Goal 3 (Week 1): Pt will be able to go up/down 1 step for home entry with min A PT Short Term Goal 4 (Week 1): Pt will be able to propel w/c x 65' with S  Skilled Therapeutic Interventions/Progress Updates:    Patient received sitting in recliner. Session focused on functional transfers/strengthening, sit<>stands, and increasing activity tolerance. Wheelchair mobility 65' x2 with R UE/LE and supervision. Sit<>stand transfers x10 with close supervision, R UE to push up, no UE for stand>sit to facilitate improved B eccentric quad control. NuStep Level 3 with R UE and B LEs x4', patient unable to tolerate any longer due to L LE pain. Most of session limited by pain. Patient returned to room and left seated in recliner with all needs within reach.  Therapy Documentation Precautions:  Precautions Precautions: Fall Precaution Comments: wound vac, history of residual left hemiparesis from CVA  Restrictions Weight Bearing Restrictions: No Pain: Pain Assessment Pain Assessment: 0-10 Pain Score: 5  Pain Type: Acute pain Pain Location: Leg Pain Orientation: Left Pain Descriptors / Indicators: Burning;Shooting Pain Frequency: Intermittent Pain Onset: On-going Patients Stated Pain Goal: 4 Pain Intervention(s): RN made aware;Repositioned;Ambulation/increased activity Multiple Pain Sites: No  See FIM for current functional status  Therapy/Group: Individual Therapy  Lillia Abed. Lulla Linville, PT, DPT 09/21/2013, 4:28 PM

## 2013-09-21 NOTE — Consult Note (Signed)
WOC wound consult note Reason for Consult: VAC dressing change to left lateral calf. Dry dressing to suture line left medial calf.  Wound type:Left lateral calf surgical wound.  Closed suture line to left medial calf.  Measurement:Left lateral calf 22 cm x 4 cm x 1 cm; Left medial calf 16 cm intact suture line.  Wound EEF:EOFH lateral calf 100% beefy red, muscle exposed with granulation tissue formation noted.  Drainage (amount, consistency, odor) Moderate, serosanguinous drainage in canister.  No odor noted.  Periwound:Intact.  Dressing procedure/placement/frequency:Open wound cleansed with NS, Mepitel silicone contact layer to wound bed.  One piece black granufoam to wound bed.  Covered with VAC drape.  Suture line cleansed with NS and pat gently dry.  Dry 4x4 applied to cover.  Ace wrap to left leg from ankle to just below knee.  Toe discoloration remains unchanged.  WOC will follow along with you for VAC dressing changes.  Domenic Moras RN BSN [facility] Pager [phone]

## 2013-09-21 NOTE — Progress Notes (Signed)
Occupational Therapy Session Note  Patient Details  Name: Donald [name] MRN: [id number] Date of Birth: 14-Jul-1945  Today's Date: 09/21/2013 Time: 9407-6808 Time Calculation (min): 45 min  Skilled Therapeutic Interventions/Progress Updates:    Pt worked on joint mobilizations to the left shoulder and scapula in sidelying and supine.  Pt with increased tightness in the pectoral and internal rotators.  Decreased active scapular adduction noted as well.  Pt returned to sitting and worked on active weightbearing over the LUE on the mat as well.  Emphasis on using the LUE to work on transitions side sitting to sitting.  Had pt work on performing lateral cervical flexion to the left while attempting to push up to sitting with use of the LUE.  Pt with increased fear and increased left trunk tightness when attempting this task.  Progressed to active elbow extension and shoulder flexion using towel and slanted board with progression toward abduction and external rotation.    Therapy Documentation Precautions:  Precautions Precautions: Fall Precaution Comments: wound vac, history of residual left hemiparesis from CVA  Restrictions Weight Bearing Restrictions: No  Pain: Pain Assessment Pain Assessment: 0-10 Pain Score: 2  Pain Type: Acute pain Pain Location: Leg Pain Orientation: Left Pain Descriptors / Indicators: Burning;Shooting Pain Frequency: Intermittent Pain Onset: On-going Patients Stated Pain Goal: 4 Pain Intervention(s): Medication (See eMAR) ADL: See FIM for current functional status  Therapy/Group: Individual Therapy  Cindra Presume OTR/L 09/21/2013, 3:23 PM

## 2013-09-22 ENCOUNTER — Inpatient Hospital Stay (HOSPITAL_COMMUNITY): Payer: Medicare Other

## 2013-09-22 ENCOUNTER — Inpatient Hospital Stay (HOSPITAL_COMMUNITY): Payer: Medicare Other | Admitting: Occupational Therapy

## 2013-09-22 ENCOUNTER — Encounter (HOSPITAL_COMMUNITY): Payer: Medicare Other | Admitting: Occupational Therapy

## 2013-09-22 LAB — GLUCOSE, CAPILLARY
GLUCOSE-CAPILLARY: 112 mg/dL — AB (ref 70–99)
GLUCOSE-CAPILLARY: 110 mg/dL — AB (ref 70–99)
GLUCOSE-CAPILLARY: 108 mg/dL — AB (ref 70–99)
Glucose-Capillary: 137 mg/dL — ABNORMAL HIGH (ref 70–99)

## 2013-09-22 MED ORDER — SENNOSIDES-DOCUSATE SODIUM 8.6-50 MG PO TABS
2.0000 | ORAL_TABLET | Freq: Two times a day (BID) | ORAL | Status: DC
  Administered 2013-09-22 – 2013-09-23 (×4): 2 via ORAL
  Filled 2013-09-22 (×5): qty 2

## 2013-09-22 NOTE — Progress Notes (Signed)
Physical Therapy Session Note  Patient Details  Name: Donald [name] MRN: [id number] Date of Birth: Apr 06, 1946  Today's Date: 09/22/2013 Time: 1525-1600 Time Calculation (min): 35 min  Short Term Goals: Week 1:  PT Short Term Goal 1 (Week 1): Pt will be able to demonstrate dynamic standing balance during functional task with steady A PT Short Term Goal 2 (Week 1): Pt will be able to gait x 50' with min A PT Short Term Goal 3 (Week 1): Pt will be able to go up/down 1 step for home entry with min A PT Short Term Goal 4 (Week 1): Pt will be able to propel w/c x 18' with S  Skilled Therapeutic Interventions/Progress Updates:   Session focused on family education with pt's wife in regards to basic transfers with RW, curb step negotiation with RW to simulate home entry/exit, general safety recommendations (especially for pt to take his time with mobility due to decreased balance), and use of w/c. [name] able to safely return demonstrate safe techniques to guard or provide steadying A for pt during mobility. Also practiced car transfer at end of session with steady A using RW.   Therapy Documentation Precautions:  Precautions Precautions: Fall Precaution Comments: wound vac, history of residual left hemiparesis from CVA  Restrictions Weight Bearing Restrictions: No  Pain: Premedicated for pain in BLE.  See FIM for current functional status  Therapy/Group: Individual Therapy  Lars Masson 09/22/2013, 4:06 PM

## 2013-09-22 NOTE — Progress Notes (Signed)
[facility] PHYSICAL MEDICINE & REHABILITATION     PROGRESS NOTE    Subjective/Complaints: Pain still an issue in his left leg. Right hip sore now too.  A 12 point review of systems has been performed and if not noted above is otherwise negative.   Objective: Vital Signs: Blood pressure 128/67, pulse 67, temperature 98.3 F (36.8 C), temperature source Oral, resp. rate 18, weight 79.017 kg (174 lb 3.2 oz), SpO2 90.00%. No results found.  Recent Labs  09/21/13 0500  HGB 7.5*  HCT 25.2*   No results found for this basename: NA, K, CL, CO, GLUCOSE, BUN, CREATININE, CALCIUM,  in the last 72 hours CBG (last 3)   Recent Labs  09/21/13 1122 09/21/13 1620 09/21/13 2114  GLUCAP 114* 107* 118*    Wt Readings from Last 3 Encounters:  09/16/13 79.017 kg (174 lb 3.2 oz)  09/11/13 79.379 kg (175 lb)  09/11/13 79.379 kg (175 lb)    Physical Exam:  HENT: oral mucosa pink and moist  Head: Normocephalic.  Eyes: EOM are normal.  Neck: Normal range of motion. Neck supple. No thyromegaly present.  Cardiovascular: Normal rate and regular rhythm. No murmur  Respiratory: Effort normal and breath sounds normal. No respiratory distress. No wheezes or rales  GI: Soft. Bowel sounds are normal. He exhibits no distension.  Musculoskeletal: mild pain LUE with PROM today  Neurological: He is alert.  Speech is intelligible. He is oriented x3 and follows basic commands. Fair insight and awareness.  motor strength is 5/5 in the right deltoid, bicep, tricep, grip, hip flexors, knee extensors, ankle dorsiflexion  3 minus to 2+ in the left deltoid, bicep, tricep, and grip with increased tone in the finger and wrist flexors as well as elbow Ashworth grade 2/4  1+ to 2- in the left ankle dorsiflexor, plantar flexors, 3 minus to 2+ in the knee extensor and hip flexor.  Decreased sensation left hand and foot.  Skin: left leg area with pink granulation (vac site). Incision clean. Toes still with ischemia  distally (stable)   Assessment/Plan: 1. Functional deficits secondary to deconditioning after thrombectomy, LLE BPG and fasciotomy which require 3+ hours per day of interdisciplinary therapy in a comprehensive inpatient rehab setting. Physiatrist is providing close team supervision and 24 hour management of active medical problems listed below. Physiatrist and rehab team continue to assess barriers to discharge/monitor patient progress toward functional and medical goals. FIM: FIM - Bathing Bathing Steps Patient Completed: Chest;Left upper leg;Right lower leg (including foot);Right Arm;Left Arm;Abdomen;Front perineal area;Buttocks;Right upper leg Bathing: 4: Min-Patient completes 8-9 54f 10 parts or 75+ percent  FIM - Upper Body Dressing/Undressing Upper body dressing/undressing steps patient completed: Thread/unthread right sleeve of pullover shirt/dresss;Thread/unthread left sleeve of pullover shirt/dress;Put head through opening of pull over shirt/dress;Pull shirt over trunk Upper body dressing/undressing: 5: Supervision: Safety issues/verbal cues FIM - Lower Body Dressing/Undressing Lower body dressing/undressing steps patient completed: Thread/unthread right pants leg;Pull pants up/down;Don/Doff right sock;Don/Doff right shoe;Fasten/unfasten right shoe;Fasten/unfasten left shoe Lower body dressing/undressing: 3: Mod-Patient completed 50-74% of tasks  FIM - Toileting Toileting steps completed by patient: Adjust clothing prior to toileting;Performs perineal hygiene;Adjust clothing after toileting Toileting Assistive Devices: Grab bar or rail for support Toileting: 4: Steadying assist  FIM - Radio producer Devices: Nurse, learning disability Transfers: 4-To toilet/BSC: Min A (steadying Pt. > 75%);4-From toilet/BSC: Min A (steadying Pt. > 75%)  FIM - Control and instrumentation engineer Devices: Walker;Arm rests Bed/Chair Transfer: 4: Chair or  W/C >  Bed: Min A (steadying Pt. > 75%);4: Bed > Chair or W/C: Min A (steadying Pt. > 75%)  FIM - Locomotion: Wheelchair Distance: 90 Locomotion: Wheelchair: 2: Travels 4 - 149 ft with supervision, cueing or coaxing FIM - Locomotion: Ambulation Locomotion: Ambulation Assistive Devices: Walker - Rolling;Orthosis Ambulation/Gait Assistance: Not tested (comment) Locomotion: Ambulation: 0: Activity did not occur  Comprehension Comprehension Mode: Auditory Comprehension: 6-Follows complex conversation/direction: With extra time/assistive device  Expression Expression Mode: Verbal Expression: 5-Expresses basic needs/ideas: With no assist  Social Interaction Social Interaction: 6-Interacts appropriately with others with medication or extra time (anti-anxiety, antidepressant).  Problem Solving Problem Solving: 5-Solves basic problems: With no assist  Memory Memory: 6-More than reasonable amt of time  Medical Problem List and Plan:  1. Functional deficits secondary to Deconditioning after thrombectomy of occluded femoropopliteal bypass as well as fasciotomy requiring wound vac in a patient with chronic left hemiparesis   -surgical plan will be to maintain vac and salvage leg as possible---area stable 2. DVT Prophylaxis/Anticoagulation: Pharmaceutical: Lovenox  3. Pain Management: maintain current oxycontin 20mg   --continue prn oxycodone. Continue Neurontin tid.   -pain present but tolerable 4. Mood: Provide ego support. LCSW to follow for evaluation and support.  5. Neuropsych: This patient is capable of making decisions on his own behalf.  6. HTN:   Continue Prinivil daily   7. DM type 2: Will monitor BS with ac/hs checks. Continue metformin and levemir daily with SSI for elevated BS---controlled relatively well   8. H/o CVA with left hemiparesis: On aggrenox bid for stroke prevention.  9. Anal cancer with liver mets/chronic intermittent constipation: Continue meslamine.  10. H/o  chronic groin wound: continue on IV Ancef for wound prophylaxis  (09/11/13). 11. Anemia: 7.5 ----recheck Wednesday   LOS (Days) 8 A FACE TO FACE EVALUATION WAS PERFORMED  Meredith Staggers 09/22/2013 7:38 AM

## 2013-09-22 NOTE — Progress Notes (Signed)
Physical Therapy Session Note  Patient Details  Name: Donald [name] MRN: [id number] Date of Birth: [birth year]  Today's Date: 09/22/2013 Time: 6283-6629 Time Calculation (min): 45 min  Short Term Goals: Week 1:  PT Short Term Goal 1 (Week 1): Pt will be able to demonstrate dynamic standing balance during functional task with steady A PT Short Term Goal 2 (Week 1): Pt will be able to gait x 50' with min A PT Short Term Goal 3 (Week 1): Pt will be able to go up/down 1 step for home entry with min A PT Short Term Goal 4 (Week 1): Pt will be able to propel w/c x 45' with S  Skilled Therapeutic Interventions/Progress Updates:   Session focused on functional transfers (with emphasis on safe stand <-> stand) with RW, gait training through obstacle course with close S/few occasions of intermittent steady A needed for balance to simulate home environment gait with RW, dynamic standing balance activity reaching for horseshoes outside BOS and place overhead with RUE (requiring steady A to light mod A for LOB to the R), and w/c propulsion for functional strengthening and endurance to/from pt room. Pt reports needing to use bathroom to have BM due to upset stomach but refused therapist to assist, so nurse tech notified and therefore, pt missed 15 min of skilled PT intervention. Pain continues to still limit pt and he requires encouragement and positive reinforcement throughout session. Declines any concerns about d/c home on Thursday.    Therapy Documentation Precautions:  Precautions Precautions: Fall Precaution Comments: wound vac, history of residual left hemiparesis from CVA  Restrictions Weight Bearing Restrictions: No  Pain: Premedicated - c/o LLE throbbing pain and RLE burning pain.  See FIM for current functional status  Therapy/Group: Individual Therapy  Lars Masson 09/22/2013, 9:19 AM

## 2013-09-22 NOTE — Progress Notes (Signed)
Occupational Therapy Session Note  Patient Details  Name: Donald [name] MRN: [id number] Date of Birth: [birth year]  Today's Date: 09/22/2013 Time: 6546-5035 Time Calculation (min): 44 min  Short Term Goals: Week 1:  OT Short Term Goal 1 (Week 1): Short Term Goals = Long Term Goals due to ELOS Week 2:     Skilled Therapeutic Interventions/Progress Updates:    Pt worked on LUE NMES to the LUE while focusing on sitting posture, balance, and left shoulder elbow activation.  Applied NMES to digit extensors as pt continues with increased tone in the digit flexors with decreased activation of the extensors.  Intensity set at 50  pulse rate  at 75, and pulse width at 300.  No adverse reactions to stimuli.  Encouraged pt to focus on shoulder flexion against a flat surface while extensors were activated to help decrease tone.     Therapy Documentation Precautions:  Precautions Precautions: Fall Precaution Comments: wound vac, history of residual left hemiparesis from CVA  Restrictions Weight Bearing Restrictions: No  Pain: Pain Assessment Pain Assessment: 0-10 Pain Score: 7  Faces Pain Scale: Hurts little more Pain Type: Acute pain Pain Location: Leg Pain Orientation: Left Pain Descriptors / Indicators: Aching Pain Onset: On-going Patients Stated Pain Goal: 5 Pain Intervention(s): Medication (See eMAR) ADL: See FIM for current functional status  Therapy/Group: Individual Therapy  Cindra Presume OTR/L 09/22/2013, 3:25 PM

## 2013-09-22 NOTE — Progress Notes (Signed)
Occupational Therapy Session Note  Patient Details  Name: Donald [name] MRN: [id number] Date of Birth: 06-24-1945  Today's Date: 09/22/2013 Time: 1001-1100 Time Calculation (min): 59 min  Short Term Goals: Week 1:  OT Short Term Goal 1 (Week 1): Short Term Goals = Long Term Goals due to ELOS  Skilled Therapeutic Interventions/Progress Updates:    Pt performed bathing and dressing sit to stand with min assist.  He was able to perform UB bathing and LB bathing sit to stand with min assist.  Pt with decreased ability to wash his LLE and RUE.  Also needs assist with donning and doffing sock and shoe on the left foot as well.  Occasional LOB posteriorly when standing to wash his peri area when standing without support.  Pt still with increased pain in the LLE at rest and with weightbearing.  Pt requesting work on the left hand during therapy this pm.  Slightly better spirits this pm compared to yesterday.    Therapy Documentation Precautions:  Precautions Precautions: Fall Precaution Comments: wound vac, history of residual left hemiparesis from CVA  Restrictions Weight Bearing Restrictions: No  Pain: Pain Assessment Pain Assessment: Faces Pain Score: 3  Faces Pain Scale: Hurts even more Pain Type: Acute pain Pain Location: Leg Pain Orientation: Left Pain Descriptors / Indicators: Aching Pain Onset: On-going Patients Stated Pain Goal: 8 Pain Intervention(s): Medication (See eMAR);Repositioned Multiple Pain Sites: No ADL: See FIM for current functional status  Therapy/Group: Individual Therapy  Cindra Presume OTR/L 09/22/2013, 12:40 PM

## 2013-09-22 NOTE — Plan of Care (Signed)
Problem: RH BOWEL ELIMINATION Goal: RH STG MANAGE BOWEL WITH ASSISTANCE STG Manage Bowel with Assistance. Mod I  Outcome: Not Progressing LBM 09-18-13 refused suppository, patient getting scheduled senna Goal: RH STG MANAGE BOWEL W/MEDICATION W/ASSISTANCE STG Manage Bowel with Medication with Mod Assistance.  Outcome: Not Progressing LBM 09-18-13 refused suppository, patient getting scheduled senna

## 2013-09-23 ENCOUNTER — Encounter (HOSPITAL_COMMUNITY): Payer: Medicare Other | Admitting: Occupational Therapy

## 2013-09-23 ENCOUNTER — Inpatient Hospital Stay (HOSPITAL_COMMUNITY): Payer: Medicare Other

## 2013-09-23 ENCOUNTER — Inpatient Hospital Stay (HOSPITAL_COMMUNITY): Payer: Medicare Other | Admitting: Occupational Therapy

## 2013-09-23 DIAGNOSIS — I739 Peripheral vascular disease, unspecified: Secondary | ICD-10-CM

## 2013-09-23 DIAGNOSIS — R5381 Other malaise: Secondary | ICD-10-CM

## 2013-09-23 DIAGNOSIS — I999 Unspecified disorder of circulatory system: Secondary | ICD-10-CM

## 2013-09-23 LAB — GLUCOSE, CAPILLARY
GLUCOSE-CAPILLARY: 115 mg/dL — AB (ref 70–99)
GLUCOSE-CAPILLARY: 112 mg/dL — AB (ref 70–99)
Glucose-Capillary: 108 mg/dL — ABNORMAL HIGH (ref 70–99)
Glucose-Capillary: 120 mg/dL — ABNORMAL HIGH (ref 70–99)

## 2013-09-23 LAB — HEMOGLOBIN AND HEMATOCRIT, BLOOD
HCT: 29.9 % — ABNORMAL LOW (ref 39.0–52.0)
Hemoglobin: 8.8 g/dL — ABNORMAL LOW (ref 13.0–17.0)

## 2013-09-23 NOTE — Progress Notes (Signed)
Physical Therapy Session Note  Patient Details  Name: Donald [name] MRN: [id number] Date of Birth: [birth year]  Today's Date: 09/23/2013 Time: 8756-4332 Time Calculation (min): 45 min  Short Term Goals: Week 1:  PT Short Term Goal 1 (Week 1): Pt will be able to demonstrate dynamic standing balance during functional task with steady A PT Short Term Goal 2 (Week 1): Pt will be able to gait x 50' with min A PT Short Term Goal 3 (Week 1): Pt will be able to go up/down 1 step for home entry with min A PT Short Term Goal 4 (Week 1): Pt will be able to propel w/c x 53' with S  Skilled Therapeutic Interventions/Progress Updates:   Session focused on completing functional transfers with RW with close S, gait in household environment with RW with close S (cues for safety), furniture transfers in ADL apartment (including bed mobility on soft bed and furniture transfers), gait with RW x 50' for endurance and strengthening (cues for posture) with close S, curb step negotiation to simulate home entry with min A and cues for safety with RW x 2 reps, and w/c mobility with RUE and RLE mod I on unit. Returned to recliner end of session.  Therapy Documentation Precautions:  Precautions Precautions: Fall Precaution Comments: wound vac, history of residual left hemiparesis from CVA  Restrictions Weight Bearing Restrictions: No  Pain: Premedicated. Still with c/o pain in BLE.  See FIM for current functional status  Therapy/Group: Individual Therapy  Lars Masson 09/23/2013, 8:27 AM

## 2013-09-23 NOTE — Progress Notes (Signed)
Occupational Therapy Session Note  Patient Details  Name: Donald [name] MRN: [id number] Date of Birth: 1945/11/05  Today's Date: 09/23/2013 Time: 0177-9390 Time Calculation (min): 45 min  Short Term Goals: Week 1:  OT Short Term Goal 1 (Week 1): Short Term Goals = Long Term Goals due to ELOS  Skilled Therapeutic Interventions/Progress Updates:    Pt performed bathing and dressing sit to stand at the sink.  Min assist for washing the RUE and LLE.  Pt able to perform all other aspects of bathing.  He could perform dressing with supervision for UB and mod assist for LB secondary to not being able to donn his left sock or shoe.  Pt usually crosses the LLE for tasks but cannot at this time secondary to wound vac placement and increased pain.  Sit to stand and standing for selfcare tasks at a supervision level.  He did need min assist for stand pivot transfer from bedside chair to wheelchair but did not use RW for support.  All grooming tasks performed at a modified independent level from the wheelchair.    Therapy Documentation Precautions:  Precautions Precautions: Fall Precaution Comments: wound vac, history of residual left hemiparesis from CVA  Restrictions Weight Bearing Restrictions: No  Pain: Pain Assessment Pain Assessment: Faces Pain Score: 10-Worst pain ever Faces Pain Scale: Hurts even more Pain Type: Acute pain Pain Location: Leg Pain Orientation: Left Pain Descriptors / Indicators: Aching Pain Frequency: Intermittent Pain Onset: On-going Patients Stated Pain Goal: 3 Pain Intervention(s): Repositioned;Emotional support ADL: See FIM for current functional status  Therapy/Group: Individual Therapy  Cindra Presume OTR/L 09/23/2013, 11:09 AM

## 2013-09-23 NOTE — Plan of Care (Signed)
Problem: RH Dressing Goal: LTG Patient will perform lower body dressing w/assist (OT) LTG: Patient will perform lower body dressing with assist, with/without cues in positioning using equipment (OT)  Outcome: Not Met (add Reason) Pt needs mod assist for LB dressing.

## 2013-09-23 NOTE — Progress Notes (Signed)
[facility] PHYSICAL MEDICINE & REHABILITATION     PROGRESS NOTE    Subjective/Complaints: Anxious about dc date being changed. Reassured him that it wasn't  A 12 point review of systems has been performed and if not noted above is otherwise negative.   Objective: Vital Signs: Blood pressure 104/55, pulse 67, temperature 97.3 F (36.3 C), temperature source Oral, resp. rate 18, weight 79.017 kg (174 lb 3.2 oz), SpO2 97.00%. No results found.  Recent Labs  09/21/13 0500  HGB 7.5*  HCT 25.2*   No results found for this basename: NA, K, CL, CO, GLUCOSE, BUN, CREATININE, CALCIUM,  in the last 72 hours CBG (last 3)   Recent Labs  09/22/13 1618 09/22/13 2108 09/23/13 0727  GLUCAP 108* 137* 108*    Wt Readings from Last 3 Encounters:  09/16/13 79.017 kg (174 lb 3.2 oz)  09/11/13 79.379 kg (175 lb)  09/11/13 79.379 kg (175 lb)    Physical Exam:  HENT: oral mucosa pink and moist  Head: Normocephalic.  Eyes: EOM are normal.  Neck: Normal range of motion. Neck supple. No thyromegaly present.  Cardiovascular: Normal rate and regular rhythm. No murmur  Respiratory: Effort normal and breath sounds normal. No respiratory distress. No wheezes or rales  GI: Soft. Bowel sounds are normal. He exhibits no distension.  Musculoskeletal: mild pain LUE with PROM today  Neurological: He is alert.  Speech is intelligible. He is oriented x3 and follows basic commands. Fair insight and awareness.  motor strength is 5/5 in the right deltoid, bicep, tricep, grip, hip flexors, knee extensors, ankle dorsiflexion  3 minus to 2+ in the left deltoid, bicep, tricep, and grip with increased tone in the finger and wrist flexors as well as elbow Ashworth grade 2/4  1+ to 2- in the left ankle dorsiflexor, plantar flexors, 3 minus to 2+ in the knee extensor and hip flexor.  Decreased sensation left hand and foot.  Skin: vac site intact. Toes still with ischemia distally  (stable)   Assessment/Plan: 1. Functional deficits secondary to deconditioning after thrombectomy, LLE BPG and fasciotomy which require 3+ hours per day of interdisciplinary therapy in a comprehensive inpatient rehab setting. Physiatrist is providing close team supervision and 24 hour management of active medical problems listed below. Physiatrist and rehab team continue to assess barriers to discharge/monitor patient progress toward functional and medical goals. FIM: FIM - Bathing Bathing Steps Patient Completed: Chest;Left upper leg;Right lower leg (including foot);Left Arm;Abdomen;Front perineal area;Buttocks;Right upper leg Bathing: 4: Min-Patient completes 8-9 63f 10 parts or 75+ percent  FIM - Upper Body Dressing/Undressing Upper body dressing/undressing steps patient completed: Thread/unthread right sleeve of pullover shirt/dresss;Thread/unthread left sleeve of pullover shirt/dress;Put head through opening of pull over shirt/dress;Pull shirt over trunk Upper body dressing/undressing: 5: Supervision: Safety issues/verbal cues FIM - Lower Body Dressing/Undressing Lower body dressing/undressing steps patient completed: Thread/unthread right pants leg;Pull pants up/down;Don/Doff right sock;Don/Doff right shoe;Fasten/unfasten right shoe;Fasten/unfasten left shoe Lower body dressing/undressing: 3: Mod-Patient completed 50-74% of tasks  FIM - Toileting Toileting steps completed by patient: Adjust clothing prior to toileting;Performs perineal hygiene;Adjust clothing after toileting Toileting Assistive Devices: Grab bar or rail for support Toileting: 4: Steadying assist  FIM - Radio producer Devices: Nurse, learning disability Transfers: 4-To toilet/BSC: Min A (steadying Pt. > 75%);4-From toilet/BSC: Min A (steadying Pt. > 75%)  FIM - Control and instrumentation engineer Devices: Walker;Arm rests Bed/Chair Transfer: 4: Bed > Chair or W/C: Min A  (steadying Pt. > 75%)  FIM -  Locomotion: Wheelchair Distance: 90 Locomotion: Wheelchair: 5: Travels 150 ft or more: maneuvers on rugs and over door sills with supervision, cueing or coaxing FIM - Locomotion: Ambulation Locomotion: Ambulation Assistive Devices: Walker - Rolling;Orthosis Ambulation/Gait Assistance: 4: Min guard Locomotion: Ambulation: 2: Travels 50 - 149 ft with minimal assistance (Pt.>75%)  Comprehension Comprehension Mode: Auditory Comprehension: 6-Follows complex conversation/direction: With extra time/assistive device  Expression Expression Mode: Verbal Expression: 5-Expresses basic needs/ideas: With extra time/assistive device  Social Interaction Social Interaction: 6-Interacts appropriately with others with medication or extra time (anti-anxiety, antidepressant).  Problem Solving Problem Solving: 5-Solves basic 90% of the time/requires cueing < 10% of the time  Memory Memory: 6-More than reasonable amt of time  Medical Problem List and Plan:  1. Functional deficits secondary to Deconditioning after thrombectomy of occluded femoropopliteal bypass as well as fasciotomy requiring wound vac in a patient with chronic left hemiparesis   -surgical plan will be to maintain vac and salvage leg as possible---area stable 2. DVT Prophylaxis/Anticoagulation: Pharmaceutical: Lovenox  3. Pain Management: maintain current oxycontin 20mg   --continue prn oxycodone. Continue Neurontin tid.   -pain present but tolerable 4. Mood: Provide ego support. LCSW to follow for evaluation and support.  5. Neuropsych: This patient is capable of making decisions on his own behalf.  6. HTN:   Continue Prinivil daily   7. DM type 2: Will monitor BS with ac/hs checks. Continue metformin and levemir daily with SSI for elevated BS---controlled   8. H/o CVA with left hemiparesis: On aggrenox bid for stroke prevention.  9. Anal cancer with liver mets/chronic intermittent constipation: Continue  meslamine.  10. H/o chronic groin wound: continue on IV Ancef for wound prophylaxis  (09/11/13). 11. Anemia: 7.5 ----recheck today   LOS (Days) 9 A FACE TO FACE EVALUATION WAS PERFORMED  Meredith Staggers 09/23/2013 7:55 AM

## 2013-09-23 NOTE — Progress Notes (Signed)
Physical Therapy Session Note  Patient Details  Name: PAXTYN BOYAR MRN: [id number] Date of Birth: Feb 08, 1946  Today's Date: 09/23/2013 Time: 1535-1600 Time Calculation (min): 25 min  Skilled Therapeutic Interventions/Progress Updates:    Final session focused on family education with pt and pt's wife reviewing car transfer (with S using RW), w/c breakdown for transport (had been delivered prior to this session), and basic transfers with RW. Both pt and wife feel comfortable with all mobility and ready for d/c tomorrow. Pt transferred back to recliner end of session with S using RW.  Therapy Documentation Precautions:  Precautions Precautions: Fall Precaution Comments: wound vac, history of residual left hemiparesis from CVA  Restrictions Weight Bearing Restrictions: No  Pain: C/o pain in BLE - premedicated.   See FIM for current functional status  Therapy/Group: Individual Therapy  Lars Masson 09/23/2013, 4:10 PM

## 2013-09-23 NOTE — Progress Notes (Signed)
Physical Therapy Discharge Summary  Patient Details  Name: Donald [name] MRN: [id number] Date of Birth: [birth year]  Today's Date: 09/23/2013  Patient has met 9 of 9 long term goals due to improved activity tolerance, improved balance, increased strength, ability to compensate for deficits, improved awareness and improved coordination.  Patient to discharge at a household ambulatory level with close S with RW and mod I w/c mobility longer distances.   Patient's wife is independent to provide the necessary physical and and supervision assistance at discharge and successfully completed Family Education with PT.  Reasons goals not met: n/a - all goals met at this time.  Recommendation:  Patient will benefit from ongoing skilled PT services in home health setting to continue to advance safe functional mobility, address ongoing impairments in gait, balance, strength, endurance, pain management, ROM, stairs, and minimize fall risk.  Equipment: [id number] w/c with basic cushion. Pt already owns RW.  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  Restrictions Weight Bearing Restrictions: No Vision/Perception  Perception Perception: Within Functional Limits  Cognition Overall Cognitive Status: Within Functional Limits for tasks assessed Safety/Judgment: Appears intact (cues at times needed to slow down for safety; recommend S) Sensation Sensation Light Touch:  (LLE limited due to ACE wrap; reports hypersensitivity L foot) Coordination Gross Motor Movements are Fluid and Coordinated:  (same as admission; residual effects from h/o of CVA) Motor  Motor Motor: Hemiplegia (L from h/o CVA (UE > LE); tremors noted in LUE)  Locomotion   Ambulation Ambulation/Gait Assistance: 5: Supervision  Trunk/Postural Assessment  Cervical Assessment Cervical Assessment: Within Functional Limits Thoracic Assessment Thoracic Assessment:  (same as admission; flexed posture) Lumbar Assessment Lumbar Assessment:  (same as admission; posterior tilt) Postural Control Postural Control: Deficits on evaluation (decreased in standing)  Balance Balance Balance Assessed: Yes Static Sitting Balance Static Sitting - Level of Assistance: 6: Modified independent (Device/Increase time) Dynamic Sitting Balance Dynamic Sitting - Level of Assistance: 6: Modified independent (Device/Increase time) Static Standing Balance Static Standing - Level of Assistance: 5: Stand by assistance (with RW) Dynamic Standing Balance Dynamic Standing - Level of Assistance: 5: Stand by assistance (close S with UE support) Extremity Assessment  RLE Assessment RLE Assessment:  (same as admission) LLE Assessment LLE Assessment: Exceptions to [facility] LLE Strength LLE Overall Strength Comments: limited due to pain. 3-/5 grossly except minimal movement at ankle noted. LLE affected by h/o of CVA  See FIM for current functional status  Lars Masson 09/23/2013, 4:14 PM

## 2013-09-23 NOTE — Plan of Care (Signed)
Problem: RH SKIN INTEGRITY Goal: RH STG ABLE TO PERFORM INCISION/WOUND CARE W/ASSISTANCE STG Able To Perform Incision/Wound Care With Mod Assistance.  Outcome: Not Progressing WOC nurse provides wound vac care MWF, patient can assist with dressing changes to left groin with dry gauze.

## 2013-09-23 NOTE — Progress Notes (Signed)
Occupational Therapy Discharge Summary  Patient Details  Name: Donald [name] MRN: [id number] Date of Birth: August 14, 1945  Today's Date: 09/23/2013 Time: 1430-1530 Time Calculation (min): 60 min  Patient has met 8 of 9 long term goals due to improved activity tolerance and improved balance.  Patient to discharge at [facility] Assist level with selfcare tasks and supervision for functional transfers.  Patient's care partner is independent to provide the necessary physical assistance at discharge.    Reasons goals not met: Pt needs mod assist for LB dressing at this time.  Recommendation:  Patient will benefit from ongoing skilled OT services in home health setting to continue to advance functional skills in the area of BADL.  He continues to need min assist for LB dressing tasks as he cannot reach his left foot.  He is able to perform functional transfers and toileting at a supervision level but feel he is still at a high risk to fall.  Recommend initial 24 hour supervision for safety and continued OT to help decreased risk of fall and increase independence with regards to selfcare function.  Per pt's wife she is OK providing 24 hour supervision at pt's current level.  Recommend continued use of the RW with hand splint on the left for functional transfers as well as using the gait belt for safety.   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  Restrictions Weight Bearing Restrictions: No  Pain Pain Assessment Pain Assessment: Faces Pain Score: 4  Faces Pain Scale: Hurts little more Pain Type: Chronic pain Pain Location: Leg Pain Orientation: Left Pain Descriptors / Indicators: Burning Pain Frequency:  Intermittent Pain Onset: On-going Patients Stated Pain Goal: 3 Pain Intervention(s): Repositioned;Emotional support ADL  See FIM scale for details  Vision/Perception  Vision- History Baseline Vision/History: Wears glasses Wears Glasses: At all times Patient Visual Report: No change from baseline Vision- Assessment Vision Assessment?: No apparent visual deficits  Cognition Overall Cognitive Status: Within Functional Limits for tasks assessed Orientation Level: Oriented X4 Memory: Appears intact Awareness: Appears intact Problem Solving: Appears intact Safety/Judgment: Appears intact Comments: Pt still requires occasional cueing to lock his wheelchair brakes as he sometimes gets distracted by the pain in his leg.  Sensation Sensation Light Touch: Appears Intact Stereognosis: Appears Intact Hot/Cold: Appears Intact Proprioception: Appears Intact Coordination Gross Motor Movements are Fluid and Coordinated: No Fine Motor Movements are Fluid and Coordinated: No Coordination and Movement Description: Pt with history of left hemiparesis in both the UE and LE secondary to older CVA.  Decreased smoothness of LE movements and UE reaching.  Tends to move with compensation and abnormal movement patterns for hip flexion and functional reaching.   Motor  Motor Motor: Hemiplegia Motor - Discharge Observations: Pt still with increased hemiparesis in the LUE which was premorbid.  Mobility  Bed Mobility Bed Mobility: Supine to Sit Supine to Sit: 6: Modified independent (Device/Increase time) Transfers Transfers: Sit to Stand;Stand to Sit Sit to Stand: 5: Supervision;With upper extremity assist;From toilet;With armrests Sit to Stand Details: Verbal cues for precautions/safety Stand to Sit: 5: Supervision;With upper extremity assist;To chair/3-in-1 Stand to Sit Details (indicate cue type and reason): Verbal cues for precautions/safety  Trunk/Postural Assessment  Cervical  Assessment Cervical Assessment: Exceptions to [facility] Cervical Strength Overall Cervical Strength Comments: Pt sits  with cervical protraction. Thoracic Assessment Thoracic Assessment: Exceptions to [facility] Thoracic Strength Overall Thoracic Strength Comments: Pt with kyphosis and rounding of the shoulders.  Postural Control Postural Control:  (Pt sits in a posterior pelvic tilt.)  Balance Balance Balance Assessed: Yes Static Sitting Balance Static Sitting - Level of Assistance: 6: Modified independent (Device/Increase time) Dynamic Sitting Balance Dynamic Sitting - Level of Assistance: 6: Modified independent (Device/Increase time) Static Standing Balance Static Standing - Level of Assistance: 5: Stand by assistance Dynamic Standing Balance Dynamic Standing - Level of Assistance: 5: Stand by assistance Extremity/Trunk Assessment RUE Assessment RUE Assessment: Within Functional Limits LUE Assessment LUE Assessment: Exceptions to WFL LUE AROM (degrees) Overall AROM Left Upper Extremity: Deficits;Due to premorbid status LUE PROM (degrees) Overall PROM Left Upper Extremity: Deficits;Due to premorbid status LUE Strength LUE Overall Strength: Due to premorbid status;Deficits (Pt with Limitations in LUE function.  Increased tone in the left finger flexors, elbow, and shoulder.  Pt reports increased pain in the right shoulder with AAROM greater than 120 degrees.  Brunnstrum stage III in the hand with trace finger flexion.)  See FIM for current functional status  Cindra Presume OTR/L 09/23/2013, 4:39 PM

## 2013-09-23 NOTE — Consult Note (Signed)
WOC wound follow up Wound type: surgical wound with NPWT  Measurement: 22cm x 4cm x 0.5cm  Wound bed: viable calf muscle with beefy tissue, left medial incision clean and intact, with some oozing noted today at a few of the sutures.  Drainage (amount, consistency, odor) serosanguinous in the canister Periwound: intact, erythema  improved  Dressing procedure/placement/frequency: 2pc of black granufoam used to cover the wound bed, layer of Mepitel used to protect the muscle.  Pt tolerated well, plans to dc to home tom. However reported that MD has advised him dc postponed to Saturday.  I will follow up with patient tom and Friday based on dc plans noted in chart.   Kelly, [place]

## 2013-09-23 NOTE — Progress Notes (Addendum)
Occupational Therapy Session Note  Patient Details  Name: Donald [name] MRN: [id number] Date of Birth: [birth year]  Today's Date: 09/23/2013 Time: 1130-1206 Time Calculation (min): 36 min  Skilled Therapeutic Interventions/Progress Updates:    Pt seen for individual OT treatment session with focus on postural control, sitting and standing balance, functional transfers using RW (recliner to EOB), L UE neuromuscular re-education, there ex LUE. Pt as supervision level for sit to stand transfers and supervision for functional mobility in room and ambulated to EOB. Sitting EOB and WB LUE. PROM to LUE (pt supine in bed) for scapular ROM, protraction/retraction, neck stretching (right ear to right shoulder to relieve pain/tightness left shoulder/leavator scapula). Also performed gentle PROM & AAROM LUE for shoulder flexion, abduction, forearm pronation/supination, elbow flexion/extension, wrist flexion, extension and digital extension x10 reps each. Verbally discussed home set up w/ pt during ther ex to review ADL's in home setting. Small heat pack, wrapped in wash cloth, applied to L shoulder at end of session for pain relief/relaxation of muscles in leavator scapula area. Pt sitting EOB w/ tray table in front of him waiting for lunch at end of session. Call bell and phone in reach and RN aware.  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

## 2013-09-24 ENCOUNTER — Telehealth: Payer: Self-pay | Admitting: Vascular Surgery

## 2013-09-24 LAB — GLUCOSE, CAPILLARY: Glucose-Capillary: 97 mg/dL (ref 70–99)

## 2013-09-24 MED ORDER — TRAZODONE HCL 50 MG PO TABS: 25.0000 mg | ORAL_TABLET | Freq: Every evening | ORAL | Status: AC | PRN

## 2013-09-24 MED ORDER — SACCHAROMYCES BOULARDII 250 MG PO CAPS: 250.0000 mg | ORAL_CAPSULE | Freq: Two times a day (BID) | ORAL | Status: AC

## 2013-09-24 MED ORDER — CEPHALEXIN 500 MG PO CAPS: 500.0000 mg | ORAL_CAPSULE | Freq: Four times a day (QID) | ORAL | Status: DC

## 2013-09-24 MED ORDER — HEPARIN SOD (PORK) LOCK FLUSH 100 UNIT/ML IV SOLN
500.0000 [IU] | INTRAVENOUS | Status: DC | PRN
  Administered 2013-09-24: 500 [IU]
  Filled 2013-09-24: qty 5

## 2013-09-24 MED ORDER — TRAMADOL HCL 50 MG PO TABS: 50.0000 mg | ORAL_TABLET | Freq: Four times a day (QID) | ORAL | Status: AC | PRN

## 2013-09-24 MED ORDER — FERROUS FUMARATE 325 (106 FE) MG PO TABS: 1.0000 | ORAL_TABLET | Freq: Two times a day (BID) | ORAL | Status: AC

## 2013-09-24 MED ORDER — SENNOSIDES-DOCUSATE SODIUM 8.6-50 MG PO TABS: 2.0000 | ORAL_TABLET | Freq: Two times a day (BID) | ORAL | Status: DC

## 2013-09-24 MED ORDER — SACCHAROMYCES BOULARDII 250 MG PO CAPS
250.0000 mg | ORAL_CAPSULE | Freq: Two times a day (BID) | ORAL | Status: DC
  Filled 2013-09-24 (×3): qty 1

## 2013-09-24 MED ORDER — HEPARIN SOD (PORK) LOCK FLUSH 100 UNIT/ML IV SOLN
500.0000 [IU] | INTRAVENOUS | Status: DC
  Filled 2013-09-24: qty 5

## 2013-09-24 MED ORDER — OXYCODONE HCL 5 MG PO TABS: 5.0000 mg | ORAL_TABLET | Freq: Four times a day (QID) | ORAL | Status: AC | PRN

## 2013-09-24 MED ORDER — OXYCODONE HCL ER 20 MG PO T12A: 20.0000 mg | EXTENDED_RELEASE_TABLET | Freq: Two times a day (BID) | ORAL | Status: DC

## 2013-09-24 NOTE — Consult Note (Signed)
[place] visited patient to assess dressing for discharge home. Hooked patient to home NPWT VAC device.  Next dressing to be change per [facility] tomorrow and M/W/F.  Will need to follow up with VVS to assess both open and sutured fasciotomy site.  [facility], [place]

## 2013-09-24 NOTE — Telephone Encounter (Addendum)
Message copied by Gena Fray on Thu Sep 24, 2013  4:42 PM ------      Message from: Denman George      Created: Thu Sep 24, 2013  2:34 PM      Regarding: RE: Follow up      Contact: [phone]       This pt. probably needs to see Dr. Scot Dock due to the need for him to reevaluate wound healing, to determine continued wound VAC vs plan for AKA.       ----- Message -----         From: Gena Fray         Sent: 09/24/2013   2:23 PM           To: Lynetta Mare Pullins, RN      Subject: Follow up                                                Rennis Golden wife called to make an appointment for wound check. I see in EPIC where an RNs progress note stated "Will need to follow up with VVS to assess both open and sutured fasciotomy site."             Dr Scot Dock was the surgeon that performed the procedure, however his schedule is overbooked on 09/30/13. Could Vinnie Level see him on 06/03 instead?            Thanks,      Hinton Dyer             ------  09/24/13: spoke with pts wife to schedule, dpm

## 2013-09-24 NOTE — Progress Notes (Signed)
Pt is discharged home with family. Pam love, PA  gave the discharged instruction

## 2013-09-24 NOTE — Patient Care Conference (Signed)
Inpatient RehabilitationTeam Conference and Plan of Care Update Date: 09/22/2013   Time: 2:30 PM    Patient Name: Donald [name]      Medical Record Number: [id number]  Date of Birth: Aug 20, 1945 Sex: Male         Room/Bed: 4M01C/4M01C-01 Payor Info: Payor: MEDICARE / Plan: MEDICARE PART A AND B / Product Type: *No Product type* /    Admitting Diagnosis: Deconditioning s p  L fem below knee bypass w fasciotomy  Admit Date/Time:  09/14/2013  6:13 PM Admission Comments: No comment available   Primary Diagnosis:  <principal problem not specified> Principal Problem: <principal problem not specified>  Patient Active Problem List   Diagnosis Date Noted  . Ischemic leg 09/05/2013  . Wound discharge 04/09/2013  . PVD (peripheral vascular disease) with claudication 01/14/2013  . Hemiparesis affecting left side as late effect of stroke 01/05/2013  . Abnormality of gait 09/03/2012  . Other acquired deformity of ankle and foot(736.79) 09/03/2012  . Aftercare following surgery of the circulatory system, [place] 05/15/2012  . 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 Peripheral vascular disease, unspecified 03/06/2012  . PVD (peripheral vascular disease) 02/21/2012  . Occlusion and stenosis of carotid artery without mention of cerebral infarction 02/21/2012  . Physical deconditioning 01/31/2012  . Atherosclerotic peripheral vascular disease with rest pain 01/24/2012  . Preoperative evaluation to rule out surgical contraindication 01/24/2012  . Anal carcinoma 10/30/2011  . Anal cancer 02/26/2011  . [place] MALIGNANT NEOPLASM OF LARGE INTESTINE&RECTUM 11/09/2008  . NAUSEA AND VOMITING 11/09/2008  . CONSTIPATION 08/18/2008  . RECTAL BLEEDING 08/18/2008  . ABDOMINAL PAIN -GENERALIZED 08/18/2008  . DM 08/17/2008  . HYPERLIPIDEMIA 08/17/2008  . HEMORRHOIDS 08/17/2008  . CONSTIPATION, CHRONIC 08/17/2008  . CEREBROVASCULAR ACCIDENT, HX OF 08/17/2008     Expected Discharge Date: Expected Discharge Date: 09/24/13  Team Members Present: Physician leading conference: Dr. Alger Simons Social Worker Present: Lennart Pall, LCSW Nurse Present: Elliot Cousin, RN PT Present: Canary Brim, PT OT Present: Blanchard Mane, OT;Jennifer Tamala Julian, [place], OT SLP Present: Weston Anna, SLP PPS Coordinator present : Daiva Nakayama, RN, CRRN     Current Status/Progress Goal Weekly Team Focus  Medical   working on pain mgt. wound healthy--will need vac at home  see prior  pain mgt, mgt of vac   Bowel/Bladder   Continent bowel and bladder LBM 09-18-13 patient gets scheduled senna refused suppository  Remain continent of bowel and bladder, Regular bowel movement   Assist with toilieting needs and treat constipation   Swallow/Nutrition/ Hydration             ADL's   Currently min assist for toileting and selfcare tasks sit to stand.  Needs assistance with washing the RUE as well as washing the LLE and donning his sock and shoe.  Severe increased pain in the LLE at wound vac.  min assist bathing and dressing and supervision for toileting  selfcare re-training, balance re-training, pain management, safety, use of DME, pt/family education   Mobility   min A overall   overall S to min A (stairs)  transfers, balance, endurance, pain management, gait training, family edu, stairs, functional strengthening   Communication             Safety/Cognition/ Behavioral Observations            Pain   Pain managed with oxy IR 10 mg   Pain managed at or below 3  Assess pain q shift and  prn and treat as needed   Skin   Wound vac to LLE,  groin incision with gauzes, foam dressing to stage 2 on coccyx  No further skin breakdown/infection  Monitor skin q shift and prn     Rehab Goals Patient on target to meet rehab goals: Yes *See Care Plan and progress notes for long and short-term goals.  Barriers to Discharge: vacuum/wound care    Possible  Resolutions to Barriers:  HHRN folllow up. education pt/family    Discharge Planning/Teaching Needs:  home with wife who will resume 24/7 assistance      Team Discussion:  Meeting supervision goals (except min stairs).  Need to do family ed with wife if she wants to practice stairs.  Home with VAC  Revisions to Treatment Plan:  None   Continued Need for Acute Rehabilitation Level of Care: The patient requires daily medical management by a physician with specialized training in physical medicine and rehabilitation for the following conditions: Daily direction of a multidisciplinary physical rehabilitation program to ensure safe treatment while eliciting the highest outcome that is of practical value to the patient.: Yes Daily medical management of patient stability for increased activity during participation in an intensive rehabilitation regime.: Yes Daily analysis of laboratory values and/or radiology reports with any subsequent need for medication adjustment of medical intervention for : Neurological problems;Post surgical problems  Lennart Pall 09/24/2013, 10:14 AM

## 2013-09-24 NOTE — Discharge Instructions (Signed)
Inpatient Rehab Discharge Instructions  Donald [name] Discharge date and time:  09/24/13  Activities/Precautions/ Functional Status: Activity: activity as tolerated Diet: diabetic diet Wound Care: Continue VAC on LLE.   Functional status:  ___ No restrictions     ___ Walk up steps independently ___ 24/7 supervision/assistance   ___ Walk up steps with assistance ___ Intermittent supervision/assistance  ___ Bathe/dress independently ___ Walk with walker     ___ Bathe/dress with assistance ___ Walk Independently    ___ Shower independently ___ Walk with assistance    ___ Shower with assistance ___ No alcohol     ___ Return to work/school ________     COMMUNITY REFERRALS UPON DISCHARGE:    Home Health:   PT     OT       RN                      Agency: [facility] Phone: [phone]   Medical Equipment/Items Ordered: wheelchair, cushion                                                     Agency/Supplier: [place]  Other: home wound VAC via [employer] @ [phone]       Special Instructions:    My questions have been answered and I understand these instructions. I will adhere to these goals and the provided educational materials after my discharge from the hospital.  Patient/Caregiver Signature _______________________________ Date __________  Clinician Signature _______________________________________ Date __________  Please bring this form and your medication list with you to all your follow-up doctor's appointments.

## 2013-09-24 NOTE — Discharge Summary (Signed)
Physician Discharge Summary  Patient ID: Donald [name] MRN: [id number] DOB/AGE: [birth year] 68 y.o.  Admit date: 09/14/2013 Discharge date: 09/24/2013  Discharge Diagnoses:  Principal Problem:   Physical deconditioning Active Problems:   DM   CONSTIPATION, CHRONIC   PVD (peripheral vascular disease)   Hemiparesis affecting left side as late effect of stroke   Ischemic leg   Discharged Condition: Stable.    Labs:   Transplant Medications and Labs Latest Ref Rng 09/23/2013 09/21/2013  Hct 39.0 - 52.0 % 29.9 (L) 25.2 (L)  Platelets 150 - 400 K/uL    WBC 4.0 - 10.5 K/uL    WBC UA <3 WBC/hpf    RBC 4.22 - 5.81 MIL/uL    Neut Rel 43 - 77 %    Monos Abs 0.1 - 1.0 K/uL    Baso Abs 0.0 - 0.1 K/uL    Baso Rel 0 - 1 %    INR 0.00 - 1.49    Na 137 - 147 mEq/L    K 3.7 - 5.3 mEq/L    Cl 96 - 112 mEq/L    CO2 19 - 32 mEq/L    Creat 0.50 - 1.35 mg/dL    BUN 6 - 23 mg/dL    Glucose 70 - 99 mg/dL    Albumin 3.5 - 5.2 g/dL    Alk Phos 39 - 117 U/L    ALT 0 - 53 U/L    AST 0 - 37 U/L    Total Bili 0.3 - 1.2 mg/dL    Ca 8.4 - 10.5 mg/dL    Hgb A1C <5.7 %    Cholesterol 0 - 200 mg/dL    HDL >39 mg/dL    LDL Calc 0 - 99 mg/dL    Triglyceride <150 mg/dL     Transplant Medications and Labs Latest Ref Rng 09/17/2013 09/15/2013  Hct 39.0 - 52.0 % 25.6 (L) 26.5 (L)  Platelets 150 - 400 K/uL 444 (H) 418 (H)  WBC 4.0 - 10.5 K/uL 6.7 6.9  WBC UA <3 WBC/hpf    RBC 4.22 - 5.81 MIL/uL 3.26 (L) 3.43 (L)  Neut Rel 43 - 77 %  65  Monos Abs 0.1 - 1.0 K/uL  0.7  Baso Abs 0.0 - 0.1 K/uL  0.0  Baso Rel 0 - 1 %  1  INR 0.00 - 1.49    Na 137 - 147 mEq/L  140  K 3.7 - 5.3 mEq/L  4.4  Cl 96 - 112 mEq/L  103  CO2 19 - 32 mEq/L  24  Creat 0.50 - 1.35 mg/dL  1.03  BUN 6 - 23 mg/dL  14  Glucose 70 - 99 mg/dL  115 (H)  Albumin 3.5 - 5.2 g/dL  2.5 (L)  Alk Phos 39 - 117 U/L  77  ALT 0 - 53 U/L  7  AST 0 - 37 U/L  26  Total Bili 0.3 - 1.2 mg/dL  0.2 (L)  Ca 8.4 - 10.5 mg/dL  8.8  Hgb A1C <5.7  %    Cholesterol 0 - 200 mg/dL    HDL >39 mg/dL    LDL Calc 0 - 99 mg/dL    Triglyceride <150 mg/dL      CBG:  Recent Labs Lab 09/23/13 0727 09/23/13 1114 09/23/13 1623 09/23/13 2049 09/24/13 0700  GLUCAP 108* 115* 120* 112* 97    Brief HPI:   Donald [name] is a 68 y.o. right-handed male with history of CVA with left-sided  weakness, HTN, DM type 2, PVD with multiple revascularization procedures (CIR stay 2014) and chronic infection left groin. He was admitted 09/05/2013 with ischemic changes LLE as well as recent fall. X rays of LLE without fracture and patient with significant anemia with Hgb 7.5 but refused transfusion. He was taken to OR for thrombectomy Left FPBG with bovine patch of distal bypass graft as well as 4 compartment fasciotomy by Dr. Edilia Bo. VAC placed on two sites to help promote wound healing and he was taken to OR on 09/11/13 for closure of medial fasciotomy site. He has developed ischemic changes left 1st and 2nd toes with demarcating of 3rd, 4th and 5th toes. Therapy initiated and patient was limited by balance, pain as well as endurance deficits. CIR recommended by rehab team.    Hospital Course: Donald [name] was admitted to rehab 09/14/2013 for inpatient therapies to consist of PT, ST and OT at least three hours five days a week. Past admission physiatrist, therapy team and rehab RN have worked together to provide customized collaborative inpatient rehab. He was maintained on IV ancef for wound prophylaxis during his hospitalization. Wound has shown improvement and continues to heal well. Wound VAC remains in place and MWF changes were undertaken with close monitoring by Dr. Edilia Bo as well as WOC nurse. Dressing changes to continue by [facility] past discharge.  Antibiotics were changed to po keflex at discharge per input from Dr. Edilia Bo. Mood has been stable with decrease in anxiety levels. Pain control was managed with addition of Oxycontin with prn oxycodone.  Bowel program was adjusted to help with narcotic induced constipation. Diabetes has been monitored with ac/hs checks and blood sugars have been well controlled. ABLA has been monitored and is slowly improving. Po intake has been good and he's continent of bowel and bladder. He will continue to receive home health PT,OT and RN by [facility] past discharge.    Rehab course: During patient's stay in rehab weekly team conferences were held to monitor patient's progress, set goals and discuss barriers to discharge. Patient has had improvement in activity tolerance, balance, postural control, as well as ability to compensate for deficits. At discharge he is able to ambulate household distances with close supervision and RW. He is  mod independent at wheelchair level for longer distances. Patient's wife is independent to provide the necessary physical and  supervision assistance at discharge and successfully completed Family Education with PT. He continues to need min assist for LB dressing tasks as he cannot reach his left foot. He is able to perform functional transfers and toileting at a supervision level due to high fall risk.    Disposition: 01-Home or Self Care  Diet: Diabetic.   Special Instructions: 1. VAC to be changed by [facility] on Mon, Wed, Fri.     Medication List    STOP taking these medications       oxyCODONE-acetaminophen 5-325 MG per tablet  Commonly known as:  PERCOCET      TAKE these medications       ALIGN PO  Take 1 tablet by mouth at bedtime.     cephALEXin 500 MG capsule  Commonly known as:  KEFLEX  Take 1 capsule (500 mg total) by mouth 4 (four) times daily.     dipyridamole-aspirin 200-25 MG per 12 hr capsule  Commonly known as:  AGGRENOX  Take 1 capsule by mouth 2 (two) times daily.     docusate sodium 100 MG capsule  Commonly known as:  COLACE  Take 200 mg by mouth 2 (two) times daily as needed for mild constipation.     ferrous fumarate 325 (106 FE) MG  Tabs tablet  Commonly known as:  HEMOCYTE - 106 mg FE  Take 1 tablet (106 mg of iron total) by mouth 2 (two) times daily.     gabapentin 300 MG capsule  Commonly known as:  NEURONTIN  Take 1 capsule (300 mg total) by mouth 3 (three) times daily.     insulin detemir 100 UNIT/ML injection  Commonly known as:  LEVEMIR  Inject 10 Units into the skin daily.     lisinopril 5 MG tablet  Commonly known as:  PRINIVIL,ZESTRIL  Take 2.5 mg by mouth daily.     mesalamine 1000 MG suppository  Commonly known as:  CANASA  Place 1 suppository (1,000 mg total) rectally at bedtime.     metFORMIN 500 MG tablet  Commonly known as:  GLUCOPHAGE  Take 500 mg by mouth 2 (two) times daily with a meal.     omega-3 acid ethyl esters 1 G capsule  Commonly known as:  LOVAZA  Take 2 g by mouth 2 (two) times daily.     omeprazole 40 MG capsule  Commonly known as:  PRILOSEC  Take 40 mg by mouth daily.     OxyCODONE 20 mg T12a 12 hr tablet--Rx 60 pills   Commonly known as:  OXYCONTIN  Take 1 tablet (20 mg total) by mouth every 12 (twelve) hours. Long acting pain medication.     oxyCODONE 5 MG immediate release tablet--Rx 150 pills   Commonly known as:  Oxy IR/ROXICODONE  Take 1-2 tablets (5-10 mg total) by mouth every 6 (six) hours as needed for severe pain.     saccharomyces boulardii 250 MG capsule  Commonly known as:  FLORASTOR  Take 1 capsule (250 mg total) by mouth 2 (two) times daily. probiotic     senna-docusate 8.6-50 MG per tablet  Commonly known as:  Senokot-S  Take 2 tablets by mouth 2 (two) times daily. For constipation.     traMADol 50 MG tablet--Rx # 45 pills   Commonly known as:  ULTRAM  Take 1 tablet (50 mg total) by mouth every 6 (six) hours as needed for moderate pain.     traZODone 50 MG tablet  Commonly known as:  DESYREL  Take 0.5-1 tablets (25-50 mg total) by mouth at bedtime as needed for sleep.     vitamin C 1000 MG tablet  Take 500 mg by mouth daily.        Follow-up Information   Call Meredith Staggers, MD. ( As needed)    Specialty:  Physical Medicine and Rehabilitation   Contact information:   [address], [place] [address] [phone]       Call Angelia Mould, MD. (For wound re-check)    Specialty:  Vascular Surgery   Contact information:   [address] [place] [address] [phone]       Follow up with Maggie Font, MD On 10/08/2013. (@ 1:45 pm)    Specialty:  Family Medicine   Contact information:   [address]  [place] [address] [phone]       Signed: Bary Leriche 09/24/2013, 4:59 PM

## 2013-09-24 NOTE — Progress Notes (Signed)
[facility]     PROGRESS NOTE    Subjective/Complaints: Pain better. Happy to be going home  A 12 point review of systems has been performed and if not noted above is otherwise negative.   Objective: Vital Signs: Blood pressure 118/68, pulse 74, temperature 97.3 F (36.3 C), temperature source Oral, resp. rate 20, weight 78.971 kg (174 lb 1.6 oz), SpO2 95.00%. No results found.  Recent Labs  09/23/13 0930  HGB 8.8*  HCT 29.9*   No results found for this basename: NA, K, CL, CO, GLUCOSE, BUN, CREATININE, CALCIUM,  in the last 72 hours CBG (last 3)   Recent Labs  09/23/13 1623 09/23/13 2049 09/24/13 0700  GLUCAP 120* 112* 97    Wt Readings from Last 3 Encounters:  09/23/13 78.971 kg (174 lb 1.6 oz)  09/11/13 79.379 kg (175 lb)  09/11/13 79.379 kg (175 lb)    Physical Exam:  HENT: oral mucosa pink and moist  Head: Normocephalic.  Eyes: EOM are normal.  Neck: Normal range of motion. Neck supple. No thyromegaly present.  Cardiovascular: Normal rate and regular rhythm. No murmur  Respiratory: Effort normal and breath sounds normal. No respiratory distress. No wheezes or rales  GI: Soft. Bowel sounds are normal. He exhibits no distension.  Musculoskeletal: mild pain LUE with PROM today  Neurological: He is alert.  Speech is intelligible. He is oriented x3 and follows basic commands. Fair insight and awareness.  motor strength is 5/5 in the right deltoid, bicep, tricep, grip, hip flexors, knee extensors, ankle dorsiflexion  3 minus to 2+ in the left deltoid, bicep, tricep, and grip with increased tone in the finger and wrist flexors as well as elbow Ashworth grade 2/4  1+ to 2- in the left ankle dorsiflexor, plantar flexors, 3 minus to 2+ in the knee extensor and hip flexor.  Decreased sensation left hand and foot.  Skin: vac site intact. Toes still with ischemia, especially middle toe   Assessment/Plan: 1. Functional deficits  secondary to deconditioning after thrombectomy, LLE BPG and fasciotomy which require 3+ hours per day of interdisciplinary therapy in a comprehensive inpatient rehab setting. Physiatrist is providing close team supervision and 24 hour management of active medical problems listed below. Physiatrist and rehab team continue to assess barriers to discharge/monitor patient progress toward functional and medical goals.  Home today with [facility] follow up. See me in a month or so. vvs follow up FIM: FIM - Bathing Bathing Steps Patient Completed: Chest;Left upper leg;Right lower leg (including foot);Left Arm;Abdomen;Front perineal area;Buttocks;Right upper leg Bathing: 4: Min-Patient completes 8-9 34f 10 parts or 75+ percent  FIM - Upper Body Dressing/Undressing Upper body dressing/undressing steps patient completed: Thread/unthread right sleeve of pullover shirt/dresss;Thread/unthread left sleeve of pullover shirt/dress;Put head through opening of pull over shirt/dress;Pull shirt over trunk Upper body dressing/undressing: 5: Supervision: Safety issues/verbal cues FIM - Lower Body Dressing/Undressing Lower body dressing/undressing steps patient completed: Thread/unthread right pants leg;Pull pants up/down;Don/Doff right sock;Don/Doff right shoe;Fasten/unfasten right shoe;Fasten/unfasten left shoe Lower body dressing/undressing: 3: Mod-Patient completed 50-74% of tasks  FIM - Toileting Toileting steps completed by patient: Adjust clothing prior to toileting;Performs perineal hygiene;Adjust clothing after toileting Toileting Assistive Devices: Grab bar or rail for support Toileting: 4: Steadying assist  FIM - Radio producer Devices: Nurse, learning disability Transfers: 4-To toilet/BSC: Min A (steadying Pt. > 75%);4-From toilet/BSC: Min A (steadying Pt. > 75%)  FIM - Control and instrumentation engineer Devices: Walker;Arm rests Bed/Chair Transfer: 6:  Supine >  Sit: No assist;6: Sit > Supine: No assist;5: Bed > Chair or W/C: Supervision (verbal cues/safety issues);5: Chair or W/C > Bed: Supervision (verbal cues/safety issues)  FIM - Locomotion: Wheelchair Distance: 90 Locomotion: Wheelchair: 6: Travels 150 ft or more, turns around, maneuvers to table, bed or toilet, negotiates 3% grade: maneuvers on rugs and over door sills independently (controlled environment) FIM - Locomotion: Ambulation Locomotion: Ambulation Assistive Devices: Walker - Rolling;Orthosis Ambulation/Gait Assistance: 5: Supervision Locomotion: Ambulation: 2: Travels 50 - 149 ft with supervision/safety issues  Comprehension Comprehension Mode: Auditory Comprehension: 6-Follows complex conversation/direction: With extra time/assistive device  Expression Expression Mode: Verbal Expression: 5-Expresses basic needs/ideas: With extra time/assistive device  Social Interaction Social Interaction: 6-Interacts appropriately with others with medication or extra time (anti-anxiety, antidepressant).  Problem Solving Problem Solving: 5-Solves basic 90% of the time/requires cueing < 10% of the time  Memory Memory: 6-More than reasonable amt of time  Medical Problem List and Plan:  1. Functional deficits secondary to Deconditioning after thrombectomy of occluded femoropopliteal bypass as well as fasciotomy requiring wound vac in a patient with chronic left hemiparesis   -continue home vac/ vvs follow up 2. DVT Prophylaxis/Anticoagulation: Pharmaceutical: Lovenox  3. Pain Management: maintain current oxycontin 20mg   --continue prn oxycodone. Continue Neurontin tid.   -pain present but tolerable 4. Mood: Provide ego support. LCSW to follow for evaluation and support.  5. Neuropsych: This patient is capable of making decisions on his own behalf.  6. HTN:   Continue Prinivil daily   7. DM type 2: Will monitor BS with ac/hs checks. Continue metformin and levemir daily with SSI for elevated  BS---controlled   8. H/o CVA with left hemiparesis: On aggrenox bid for stroke prevention.  9. Anal cancer with liver mets/chronic intermittent constipation: Continue meslamine.  10. H/o chronic groin wound: continue on IV Ancef for wound prophylaxis  (09/11/13). 11. Anemia: hgb up to 8.8   LOS (Days) 10 A FACE TO FACE EVALUATION WAS PERFORMED  Meredith Staggers 09/24/2013 7:48 AM

## 2013-09-30 ENCOUNTER — Encounter: Payer: Self-pay | Admitting: Vascular Surgery

## 2013-09-30 ENCOUNTER — Ambulatory Visit: Payer: Medicare Other | Admitting: Vascular Surgery

## 2013-10-01 ENCOUNTER — Ambulatory Visit (INDEPENDENT_AMBULATORY_CARE_PROVIDER_SITE_OTHER): Payer: Self-pay | Admitting: Vascular Surgery

## 2013-10-01 ENCOUNTER — Encounter: Payer: Self-pay | Admitting: Vascular Surgery

## 2013-10-01 VITALS — BP 170/77 | HR 94 | Resp 18 | Ht 69.0 in | Wt 173.0 lb

## 2013-10-01 DIAGNOSIS — I739 Peripheral vascular disease, unspecified: Secondary | ICD-10-CM

## 2013-10-01 NOTE — Plan of Care (Signed)
Problem: RH SKIN INTEGRITY Goal: RH STG ABLE TO PERFORM INCISION/WOUND CARE W/ASSISTANCE STG Able To Perform Incision/Wound Care With Mod Assistance.  Outcome: Adequate for Discharge Managed by wife and home health nurse past discharge per documentation.

## 2013-10-01 NOTE — Progress Notes (Signed)
   Patient name: Donald [name] MRN: [id number] DOB: 10-21-1945 Sex: male  REASON FOR VISIT: Follow up after thrombectomy of left femoropopliteal bypass graft  HPI: ELAI [name] is a 68 y.o. male with a complicated vascular history. He has undergone previous left axillobifemoral bypass graft by Dr. Oneida Alar.. He had a left femoropopliteal bypass graft with vein which occluded and this was replaced with a prosthetic graft. 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 He developed an ischemic left lower extremity 09/05/2013. He was taken urgently to the acting room for attempted revascularization. The leg had been ischemic for over 12 hours.  He underwent thrombectomy of his left femoropopliteal bypass graft from the below the knee incision and bovine pericardial patch angioplasty of the distal bypass graft. He required 4 compartment fasciotomy. He was taken back to the operating room on 09/11/2013 and had closure of the medial fasciotomy site. However we continued to use the VAC on the lateral fasciotomy site. He comes in for a routine follow up visit.  He has no specific complaints. He states that they will replace the vac today. He denies fever or chills.  REVIEW OF SYSTEMS: Valu.Nieves ] denotes positive finding; [  ] denotes negative finding  CARDIOVASCULAR:  [ ]  chest pain   [ ]  dyspnea on exertion    CONSTITUTIONAL:  [ ]  fever   [ ]  chills  PHYSICAL EXAM: Filed Vitals:   10/01/13 1330  BP: 170/77  Pulse: 94  Resp: 18  Height: 5\' 9"  (1.753 m)  Weight: 173 lb (78.472 kg)   Body mass index is 25.54 kg/(m^2). GENERAL: The patient is a well-nourished male, in no acute distress. The vital signs are documented above. CARDIOVASCULAR: There is a regular rate and rhythm. PULMONARY: There is good air exchange bilaterally without wheezing or rales. The medial incision is healing  nicely and the sutures can be removed today. There is no erythema. There is a small amount of serous from the central portion of the incision. The lateral wound is granulating nicely and measures 23 cm in length by 3.5 cm in maximum width.  MEDICAL ISSUES: The patient is doing well status post thrombectomy of his femoropopliteal bypass graft with 4 compartment fasciotomy. We will continue with the VAC on the lateral fasciotomy site as the wound is still fairly large. We'll remove his sutures from the medial incision today. His graft is patent. She has had a chronic draining wound from the left groin but currently this is dry without any drainage. He had previously discussed using Coumadin to help keep this graft open however he refuses to take Coumadin. I will see him back in 3 weeks to determine if we should continue with the VAC. He knows to call sooner if he has problems.  Angelia Mould [facility] Beeper: [phone]

## 2013-10-07 ENCOUNTER — Telehealth: Payer: Self-pay | Admitting: Oncology

## 2013-10-07 ENCOUNTER — Encounter (HOSPITAL_COMMUNITY): Payer: Self-pay | Admitting: Emergency Medicine

## 2013-10-07 ENCOUNTER — Telehealth: Payer: Self-pay

## 2013-10-07 ENCOUNTER — Inpatient Hospital Stay (HOSPITAL_COMMUNITY)
Admission: EM | Admit: 2013-10-07 | Discharge: 2013-10-13 | DRG: 239 | Disposition: A | Discharge status: Rehabilitation Facility | Payer: Medicare Other | Attending: Internal Medicine | Admitting: Internal Medicine

## 2013-10-07 DIAGNOSIS — C785 Secondary malignant neoplasm of large intestine and rectum: Secondary | ICD-10-CM

## 2013-10-07 DIAGNOSIS — R112 Nausea with vomiting, unspecified: Secondary | ICD-10-CM

## 2013-10-07 DIAGNOSIS — I998 Other disorder of circulatory system: Secondary | ICD-10-CM | POA: Diagnosis present

## 2013-10-07 DIAGNOSIS — F39 Unspecified mood [affective] disorder: Secondary | ICD-10-CM | POA: Diagnosis present

## 2013-10-07 DIAGNOSIS — L97909 Non-pressure chronic ulcer of unspecified part of unspecified lower leg with unspecified severity: Secondary | ICD-10-CM | POA: Diagnosis present

## 2013-10-07 DIAGNOSIS — I69998 Other sequelae following unspecified cerebrovascular disease: Secondary | ICD-10-CM

## 2013-10-07 DIAGNOSIS — G8929 Other chronic pain: Secondary | ICD-10-CM | POA: Diagnosis present

## 2013-10-07 DIAGNOSIS — I6529 Occlusion and stenosis of unspecified carotid artery: Secondary | ICD-10-CM

## 2013-10-07 DIAGNOSIS — G8918 Other acute postprocedural pain: Secondary | ICD-10-CM | POA: Diagnosis not present

## 2013-10-07 DIAGNOSIS — Z87891 Personal history of nicotine dependence: Secondary | ICD-10-CM

## 2013-10-07 DIAGNOSIS — I70229 Atherosclerosis of native arteries of extremities with rest pain, unspecified extremity: Secondary | ICD-10-CM

## 2013-10-07 DIAGNOSIS — K5900 Constipation, unspecified: Secondary | ICD-10-CM

## 2013-10-07 DIAGNOSIS — R5381 Other malaise: Secondary | ICD-10-CM

## 2013-10-07 DIAGNOSIS — Z8679 Personal history of other diseases of the circulatory system: Secondary | ICD-10-CM

## 2013-10-07 DIAGNOSIS — I10 Essential (primary) hypertension: Secondary | ICD-10-CM | POA: Diagnosis present

## 2013-10-07 DIAGNOSIS — L98499 Non-pressure chronic ulcer of skin of other sites with unspecified severity: Secondary | ICD-10-CM | POA: Diagnosis present

## 2013-10-07 DIAGNOSIS — I2510 Atherosclerotic heart disease of native coronary artery without angina pectoris: Secondary | ICD-10-CM | POA: Diagnosis present

## 2013-10-07 DIAGNOSIS — Z8673 Personal history of transient ischemic attack (TIA), and cerebral infarction without residual deficits: Secondary | ICD-10-CM

## 2013-10-07 DIAGNOSIS — I7092 Chronic total occlusion of artery of the extremities: Secondary | ICD-10-CM | POA: Diagnosis present

## 2013-10-07 DIAGNOSIS — R29898 Other symptoms and signs involving the musculoskeletal system: Secondary | ICD-10-CM | POA: Diagnosis present

## 2013-10-07 DIAGNOSIS — K602 Anal fissure, unspecified: Secondary | ICD-10-CM | POA: Diagnosis present

## 2013-10-07 DIAGNOSIS — E785 Hyperlipidemia, unspecified: Secondary | ICD-10-CM

## 2013-10-07 DIAGNOSIS — E1159 Type 2 diabetes mellitus with other circulatory complications: Secondary | ICD-10-CM

## 2013-10-07 DIAGNOSIS — K5909 Other constipation: Secondary | ICD-10-CM

## 2013-10-07 DIAGNOSIS — Z48812 Encounter for surgical aftercare following surgery on the circulatory system: Secondary | ICD-10-CM

## 2013-10-07 DIAGNOSIS — K625 Hemorrhage of anus and rectum: Secondary | ICD-10-CM

## 2013-10-07 DIAGNOSIS — C210 Malignant neoplasm of anus, unspecified: Secondary | ICD-10-CM

## 2013-10-07 DIAGNOSIS — D649 Anemia, unspecified: Secondary | ICD-10-CM | POA: Diagnosis present

## 2013-10-07 DIAGNOSIS — F0630 Mood disorder due to known physiological condition, unspecified: Secondary | ICD-10-CM

## 2013-10-07 DIAGNOSIS — R269 Unspecified abnormalities of gait and mobility: Secondary | ICD-10-CM

## 2013-10-07 DIAGNOSIS — M216X9 Other acquired deformities of unspecified foot: Secondary | ICD-10-CM

## 2013-10-07 DIAGNOSIS — I739 Peripheral vascular disease, unspecified: Secondary | ICD-10-CM

## 2013-10-07 DIAGNOSIS — R52 Pain, unspecified: Secondary | ICD-10-CM

## 2013-10-07 DIAGNOSIS — IMO0002 Reserved for concepts with insufficient information to code with codable children: Secondary | ICD-10-CM

## 2013-10-07 DIAGNOSIS — R1084 Generalized abdominal pain: Secondary | ICD-10-CM

## 2013-10-07 DIAGNOSIS — K649 Unspecified hemorrhoids: Secondary | ICD-10-CM

## 2013-10-07 DIAGNOSIS — K219 Gastro-esophageal reflux disease without esophagitis: Secondary | ICD-10-CM | POA: Diagnosis present

## 2013-10-07 DIAGNOSIS — Z85048 Personal history of other malignant neoplasm of rectum, rectosigmoid junction, and anus: Secondary | ICD-10-CM

## 2013-10-07 DIAGNOSIS — E119 Type 2 diabetes mellitus without complications: Secondary | ICD-10-CM

## 2013-10-07 DIAGNOSIS — I70509 Unspecified atherosclerosis of nonautologous biological bypass graft(s) of the extremities, unspecified extremity: Principal | ICD-10-CM | POA: Diagnosis present

## 2013-10-07 DIAGNOSIS — T148XXA Other injury of unspecified body region, initial encounter: Secondary | ICD-10-CM

## 2013-10-07 DIAGNOSIS — I69354 Hemiplegia and hemiparesis following cerebral infarction affecting left non-dominant side: Secondary | ICD-10-CM

## 2013-10-07 DIAGNOSIS — E43 Unspecified severe protein-calorie malnutrition: Secondary | ICD-10-CM | POA: Insufficient documentation

## 2013-10-07 DIAGNOSIS — Z01818 Encounter for other preprocedural examination: Secondary | ICD-10-CM

## 2013-10-07 DIAGNOSIS — M79672 Pain in left foot: Secondary | ICD-10-CM

## 2013-10-07 DIAGNOSIS — I999 Unspecified disorder of circulatory system: Secondary | ICD-10-CM

## 2013-10-07 DIAGNOSIS — Z794 Long term (current) use of insulin: Secondary | ICD-10-CM

## 2013-10-07 LAB — CBC WITH DIFFERENTIAL/PLATELET
Basophils Absolute: 0 10*3/uL (ref 0.0–0.1)
Basophils Relative: 0 % (ref 0–1)
Eosinophils Absolute: 0.1 10*3/uL (ref 0.0–0.7)
Eosinophils Relative: 1 % (ref 0–5)
HCT: 32.3 % — ABNORMAL LOW (ref 39.0–52.0)
Hemoglobin: 9.8 g/dL — ABNORMAL LOW (ref 13.0–17.0)
LYMPHS PCT: 18 % (ref 12–46)
Lymphs Abs: 1.4 10*3/uL (ref 0.7–4.0)
MCH: 25 pg — ABNORMAL LOW (ref 26.0–34.0)
MCHC: 30.3 g/dL (ref 30.0–36.0)
MCV: 82.4 fL (ref 78.0–100.0)
Monocytes Absolute: 0.7 10*3/uL (ref 0.1–1.0)
Monocytes Relative: 9 % (ref 3–12)
NEUTROS PCT: 72 % (ref 43–77)
Neutro Abs: 5.5 10*3/uL (ref 1.7–7.7)
PLATELETS: 284 10*3/uL (ref 150–400)
RBC: 3.92 MIL/uL — ABNORMAL LOW (ref 4.22–5.81)
RDW: 23.1 % — ABNORMAL HIGH (ref 11.5–15.5)
WBC: 7.7 10*3/uL (ref 4.0–10.5)

## 2013-10-07 LAB — BASIC METABOLIC PANEL
BUN: 11 mg/dL (ref 6–23)
CO2: 22 mEq/L (ref 19–32)
CREATININE: 0.94 mg/dL (ref 0.50–1.35)
Calcium: 9.4 mg/dL (ref 8.4–10.5)
Chloride: 99 mEq/L (ref 96–112)
GFR calc Af Amer: >90 mL/min (ref >90)
GFR, EST NON AFRICAN AMERICAN: 83 mL/min — AB (ref >90)
Glucose, Bld: 116 mg/dL — ABNORMAL HIGH (ref 70–99)
Potassium: 4.6 mEq/L (ref 3.7–5.3)
Sodium: 137 mEq/L (ref 137–147)

## 2013-10-07 LAB — GLUCOSE, CAPILLARY: Glucose-Capillary: 115 mg/dL — ABNORMAL HIGH (ref 70–99)

## 2013-10-07 LAB — APTT: aPTT: 26 seconds (ref 24–37)

## 2013-10-07 LAB — PROTIME-INR
INR: 1.06 (ref 0.00–1.49)
PROTHROMBIN TIME: 13.6 s (ref 11.6–15.2)

## 2013-10-07 MED ORDER — ONDANSETRON HCL 4 MG/2ML IJ SOLN: 4.0000 mg | Freq: Four times a day (QID) | INTRAMUSCULAR | Status: DC | PRN

## 2013-10-07 MED ORDER — ACETAMINOPHEN 650 MG RE SUPP: 650.0000 mg | Freq: Four times a day (QID) | RECTAL | Status: DC | PRN

## 2013-10-07 MED ORDER — HYDRALAZINE HCL 20 MG/ML IJ SOLN
10.0000 mg | INTRAMUSCULAR | Status: DC | PRN
Start: 2013-10-07 — End: 2013-10-13
  Administered 2013-10-10: 10 mg via INTRAVENOUS
  Filled 2013-10-07: qty 1

## 2013-10-07 MED ORDER — SODIUM CHLORIDE 0.9 % IV SOLN
INTRAVENOUS | Status: DC
  Administered 2013-10-07: 18:00:00 via INTRAVENOUS

## 2013-10-07 MED ORDER — MORPHINE SULFATE 2 MG/ML IJ SOLN
2.0000 mg | INTRAMUSCULAR | Status: DC | PRN
  Administered 2013-10-08 – 2013-10-09 (×6): 2 mg via INTRAVENOUS
  Filled 2013-10-07 (×6): qty 1

## 2013-10-07 MED ORDER — ACETAMINOPHEN 325 MG PO TABS: 650.0000 mg | ORAL_TABLET | Freq: Four times a day (QID) | ORAL | Status: DC | PRN

## 2013-10-07 MED ORDER — MORPHINE SULFATE 2 MG/ML IJ SOLN
2.0000 mg | INTRAMUSCULAR | Status: AC | PRN
  Administered 2013-10-07 (×2): 2 mg via INTRAVENOUS
  Filled 2013-10-07 (×2): qty 1

## 2013-10-07 MED ORDER — INSULIN ASPART 100 UNIT/ML ~~LOC~~ SOLN
0.0000 [IU] | Freq: Three times a day (TID) | SUBCUTANEOUS | Status: DC
  Administered 2013-10-08 – 2013-10-10 (×2): 1 [IU] via SUBCUTANEOUS
  Administered 2013-10-10: 2 [IU] via SUBCUTANEOUS
  Administered 2013-10-11: 1 [IU] via SUBCUTANEOUS
  Administered 2013-10-11: 2 [IU] via SUBCUTANEOUS
  Administered 2013-10-12 (×2): 1 [IU] via SUBCUTANEOUS
  Administered 2013-10-13: 2 [IU] via SUBCUTANEOUS
  Administered 2013-10-13: 1 [IU] via SUBCUTANEOUS

## 2013-10-07 MED ORDER — ONDANSETRON HCL 4 MG PO TABS: 4.0000 mg | ORAL_TABLET | Freq: Four times a day (QID) | ORAL | Status: DC | PRN

## 2013-10-07 MED ORDER — SODIUM CHLORIDE 0.9 % IV SOLN: INTRAVENOUS | Status: AC

## 2013-10-07 MED ORDER — INSULIN GLARGINE 100 UNIT/ML ~~LOC~~ SOLN
5.0000 [IU] | Freq: Every day | SUBCUTANEOUS | Status: DC
  Administered 2013-10-07 – 2013-10-12 (×6): 5 [IU] via SUBCUTANEOUS
  Filled 2013-10-07 (×7): qty 0.05

## 2013-10-07 NOTE — ED Notes (Signed)
Attempted report 

## 2013-10-07 NOTE — Telephone Encounter (Signed)
returned pt call and r/s flush due to pt has flush on 5.28 in hospital. pt aware of new dt

## 2013-10-07 NOTE — H&P (Signed)
[facility] History and Physical  Donald [name] BMW:[id number] DOB: 31-Jan-1946 DOA: 10/07/2013  Referring physician: ER physician. PCP: Maggie Font, MD   Chief Complaint: Left leg pain.  HPI: Donald [name] is a 68 y.o. male who was recently admitted for ischemic limb and had undergone left sided thrombectomy and left femoral popliteal bypass graft with patch angioplasty and fasciotomy with wound VAC was eventually discharged to rehabilitation and patient has been at home last 10 days started experiencing worsening pain of his left lower is to be since morning which was cold. On exam in the ER patient was not having any palpable pulses down below the knee. Patient during last he had refused any anticoagulants. On-call surgeon Dr. Kellie Simmering was consulted and at this time plan is to have the left leg amputated. Patient otherwise denies any chest pain or shortness of breath nausea vomiting abdominal pain diarrhea. Patient has very minimal movement below his left knee.   Review of Systems: As presented in the history of presenting illness, rest negative.  Past Medical History  Diagnosis Date  . GERD (gastroesophageal reflux disease)   . Hyperlipidemia   . Hypertension   . Peripheral vascular disease   . Internal and external bleeding hemorrhoids   . History of gout     left great toe  . Depression   . Numbness in right leg     Hx: of diabetes  . Coronary artery disease     RCA occlusion with good collaterals, o/w no obs CAD 12/2011  . Pneumonia ~ 1977  . Type II diabetes mellitus   . History of blood transfusion 12/2011; 12/2012    "before OR; after OR" (01/22/2013)  . Stroke 2008    "left side weak; unable to move left hand still" (01/22/2013)  . Rectal cancer dx'd 08/2007    S/P chemo, radiation, biopsies  . Metastases to the liver dx'd 08/2010   Past Surgical History  Procedure Laterality Date  . Carotid endarterectomy Right 2008  . Liver cryoablation  08/2010    "chemo  shrunk the cancer 50% then they went in and burned the rest of it"  . Axillary-femoral bypass graft  01/29/2012    Procedure: BYPASS GRAFT AXILLA-BIFEMORAL;  Surgeon: Elam Dutch, MD;  Location: [facility] OR;  Service: Vascular;  Laterality: N/A;  Left axilla-bifemoral bypass using gore-tex graft.   . Colonoscopy w/ biopsies and polypectomy      Hx: of  . Femoral-popliteal bypass graft Left 01/06/2013    Procedure: Femoral-Popliteal Artery Bypass;  Surgeon: Elam Dutch, MD;  Location: [place];  Service: Vascular;  Laterality: Left;  . Intraoperative arteriogram Left 01/06/2013    Procedure: INTRA OPERATIVE ARTERIOGRAM;  Surgeon: Elam Dutch, MD;  Location: [place];  Service: Vascular;  Laterality: Left;  . Femoral-popliteal bypass graft Left 01/10/2013    Procedure: THROMBECTOMY POSSIBLE BYPASS GRAFT FEMORAL-POPLITEAL ARTERY;  Surgeon: Mal Misty, MD;  Location: [place];  Service: Vascular;  Laterality: Left;  Attempted Thrombectomy of left Femoral/Popliteal graft - Unsuccessful.  Insertion of Left femoral to below the knee popliteal propaten graft.  . Mass biopsy  2009    "found mass that was cancer; later had chemo and radiation; never cut mass out" (01/22/2013)  . Femoral-popliteal bypass graft Left 09/05/2013    Procedure: Thrombectomy of Left Femoral-Below Knee Popliteal Bypass Graft; Patch Angioplasty of Distal Portion of Bypass Graft;  Surgeon: Angelia Mould, MD;  Location: [place];  Service: Vascular;  Laterality: Left;  .  Patch angioplasty Left 09/05/2013    Procedure: Patch Angioplasty Distal Portion of Left Femoral-Below Knee Popliteal Bypass Graft;  Surgeon: Angelia Mould, MD;  Location: [place];  Service: Vascular;  Laterality: Left;  . Intraoperative arteriogram Left 09/05/2013    Procedure: INTRA OPERATIVE ARTERIOGRAM;  Surgeon: Angelia Mould, MD;  Location: [place];  Service: Vascular;  Laterality: Left;  . Fasciotomy Left 09/05/2013    Procedure: FASCIOTOMY;  Surgeon:  Angelia Mould, MD;  Location: [place];  Service: Vascular;  Laterality: Left;  Marland Kitchen Vascular surgery    . Fasciotomy closure Left 09/11/2013    Procedure: CLOSURE OF FASCIOTOMY SITES LEFT LOWER EXTREMITY AND WOUND VAC APPLICATION;  Surgeon: Angelia Mould, MD;  Location: [place];  Service: Vascular;  Laterality: Left;   Social History:  reports that he has quit smoking. His smoking use included Cigarettes. He has a 5.7 pack-year smoking history. He has never used smokeless tobacco. He reports that he does not drink alcohol or use illicit drugs. Where does patient live home. Can patient participate in ADLs? Not sure.  Allergies  Allergen Reactions  . Other     Became hypotensive. Pt states allergic to "muscle relaxer" but unsure of name.  . Tape Hives and Itching    medfix non woven tape    Family History:  Family History  Problem Relation Age of Onset  . Cancer - Other Sister       Prior to Admission medications   Medication Sig Start Date End Date Taking? Authorizing Provider  Ascorbic Acid (VITAMIN C) 1000 MG tablet Take 500 mg by mouth daily.    Yes Historical Provider, MD  cephALEXin (KEFLEX) 500 MG capsule Take 1 capsule (500 mg total) by mouth 4 (four) times daily. 09/24/13  Yes Ivan Anchors Love, PA-C  dipyridamole-aspirin (AGGRENOX) 200-25 MG per 12 hr capsule Take 1 capsule by mouth 2 (two) times daily.   Yes Historical Provider, MD  docusate sodium (COLACE) 100 MG capsule Take 200 mg by mouth 2 (two) times daily as needed for mild constipation.   Yes Historical Provider, MD  ferrous fumarate (HEMOCYTE - 106 MG FE) 325 (106 FE) MG TABS tablet Take 1 tablet (106 mg of iron total) by mouth 2 (two) times daily. 09/24/13  Yes Ivan Anchors Love, PA-C  gabapentin (NEURONTIN) 300 MG capsule Take 1 capsule (300 mg total) by mouth 3 (three) times daily. 05/15/13  Yes Dennie Bible, NP  insulin detemir (LEVEMIR) 100 UNIT/ML injection Inject 10 Units into the skin daily.   Yes  Historical Provider, MD  lisinopril (PRINIVIL,ZESTRIL) 5 MG tablet Take 2.5 mg by mouth daily.   Yes Historical Provider, MD  metFORMIN (GLUCOPHAGE) 500 MG tablet Take 500 mg by mouth 2 (two) times daily with a meal.   Yes Historical Provider, MD  naproxen sodium (ANAPROX) 220 MG tablet Take 220 mg by mouth daily as needed (pain).   Yes Historical Provider, MD  omega-3 acid ethyl esters (LOVAZA) 1 G capsule Take 2 g by mouth 2 (two) times daily.    Yes Historical Provider, MD  omeprazole (PRILOSEC) 40 MG capsule Take 40 mg by mouth daily.   Yes Historical Provider, MD  oxyCODONE (OXY IR/ROXICODONE) 5 MG immediate release tablet Take 1-2 tablets (5-10 mg total) by mouth every 6 (six) hours as needed for severe pain. 09/24/13  Yes Ivan Anchors Love, PA-C  OxyCODONE (OXYCONTIN) 20 mg T12A 12 hr tablet Take 1 tablet (20 mg total) by mouth  every 12 (twelve) hours. Long acting pain medication. 09/24/13  Yes Ivan Anchors Love, PA-C  oxyCODONE-acetaminophen (PERCOCET/ROXICET) 5-325 MG per tablet Take 1 tablet by mouth every 4 (four) hours as needed for moderate pain or severe pain.   Yes Historical Provider, MD  saccharomyces boulardii (FLORASTOR) 250 MG capsule Take 1 capsule (250 mg total) by mouth 2 (two) times daily. probiotic 09/24/13  Yes Ivan Anchors Love, PA-C  senna-docusate (SENOKOT-S) 8.6-50 MG per tablet Take 2 tablets by mouth 2 (two) times daily. For constipation. 09/24/13  Yes Ivan Anchors Love, PA-C  traMADol (ULTRAM) 50 MG tablet Take 1 tablet (50 mg total) by mouth every 6 (six) hours as needed for moderate pain. 09/24/13  Yes Ivan Anchors Love, PA-C  traZODone (DESYREL) 50 MG tablet Take 0.5-1 tablets (25-50 mg total) by mouth at bedtime as needed for sleep. 09/24/13  Yes Bary Leriche, PA-C    Physical Exam: Filed Vitals:   10/07/13 1800 10/07/13 1830 10/07/13 1843 10/07/13 2123  BP: 166/88 154/98  171/55  Pulse: 90 91  87  Temp:    98.6 F (37 C)  TempSrc:    Oral  Resp: 20 24 19 18   Height:    5\' 9"   (1.753 m)  Weight:    75.297 kg (166 lb)  SpO2: 98% 96% 93% 99%     General:  Well-developed and nourished.  Eyes: Anicteric no pallor.  ENT: No discharge from the ears eyes nose mouth.  Neck: No mass felt.  Cardiovascular: S1-S2 heard.  Respiratory: No rhonchi or crepitations.  Abdomen: Soft nontender. No guarding rigidity. Bowel sounds present.  Skin: Left groin wound looks healed. Left lower extremity has multiple ecchymosis and bruises.  Musculoskeletal: Left lower extremity is cold to touch. Pulses are not palpable below left knee.  Psychiatric: Appears normal.  Neurologic: Alert oriented to time place and person. Patient has no movement below left knee.  Labs on Admission:  Basic Metabolic Panel:  Recent Labs Lab 10/07/13 1748  NA 137  K 4.6  CL 99  CO2 22  GLUCOSE 116*  BUN 11  CREATININE 0.94  CALCIUM 9.4   Liver Function Tests: No results found for this basename: AST, ALT, ALKPHOS, BILITOT, PROT, ALBUMIN,  in the last 168 hours No results found for this basename: LIPASE, AMYLASE,  in the last 168 hours No results found for this basename: AMMONIA,  in the last 168 hours CBC:  Recent Labs Lab 10/07/13 1748  WBC 7.7  NEUTROABS 5.5  HGB 9.8*  HCT 32.3*  MCV 82.4  PLT 284   Cardiac Enzymes: No results found for this basename: CKTOTAL, CKMB, CKMBINDEX, TROPONINI,  in the last 168 hours  BNP (last 3 results) No results found for this basename: PROBNP,  in the last 8760 hours CBG: No results found for this basename: GLUCAP,  in the last 168 hours  Radiological Exams on Admission: No results found.   Assessment/Plan Principal Problem:   Ischemic leg Active Problems:   DM   PVD (peripheral vascular disease)   HTN (hypertension)   Anemia   Ischemia of extremity   1. Ischemic left leg - vascular surgery consult appreciated and patient is planned to have amputation per vascular surgery and in anticipation of which patient will be kept  n.p.o. past midnight with pain relief medications. Type and screen. 2. Diabetes mellitus - patient's Lantus dose has been decreased to half. Closely follow CBGs. 3. Hypertension - patient is on when necessary IV hydralazine.  4. Anemia - follow CBC closely postsurgery. Type and screen for now. 5. History of CVA.   Code Status:  Full code.  Family Communication:  Patient's wife at the bedside.  Disposition Plan:  Admit to inpatient.    Donald [name] N. [facility] Pager [phone].  If 7PM-7AM, please contact night-coverage [email or web address] Password [email or web address] 10/07/2013, 10:05 PM

## 2013-10-07 NOTE — ED Notes (Signed)
PT presents from home via [facility] with c/o LLE pain that began early this morning around 0100. Pt had a blood clot removed from his left calf in May 2015 and currently has a wound vac to lateral left calf. Pt reports sudden severe onset of pain/ Leg appears mottled, foot has +2 pedal edema, pedal pulse unable to be obtained with doppler. Dr. Thurnell Garbe made aware and is at bedside.

## 2013-10-07 NOTE — Consult Note (Signed)
Vascular Surgery Consultation  Reason for Consult: Severely ischemic left leg  HPI: Donald [name] is a 68 y.o. male who presents for evaluation of severely ischemic left leg. Sponge is well known to our practice having previously undergone left axillobifemoral bypass grafting by Dr. Juanda Crumble feels and left femoral popliteal bypass grafting initially was saphenous vein and then replaced with Gore-Tex in September of 2014. 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 Oneida Alar. He also has refused Coumadin in the past. He occluded his left femoral-popliteal graft may 9,2015 and had thrombectomy of the graft through the left popliteal route by Dr. Doren Custard with a patch angioplasty. The patient required 4 compartment fasciotomy in the back wound coverag   He was seen by Dr. Doren Custard in the office 6 days ago with graft functioning. Last night at midnight-18 hours ago the patient developed pain and numbness in the left foot. He came to the emergency department late this afternoon with known occlusion of his left femoral-popliteal bypass with pain and numbness. Past Medical History  Diagnosis Date  . GERD (gastroesophageal reflux disease)   . Hyperlipidemia   . Hypertension   . Peripheral vascular disease   . Internal and external bleeding hemorrhoids   . History of gout     left great toe  . Depression   . Numbness in right leg     Hx: of diabetes  . Coronary artery disease     RCA occlusion with good collaterals, o/w no obs CAD 12/2011  . Pneumonia ~ 1977  . Type II diabetes mellitus   . History of blood transfusion 12/2011; 12/2012    "before OR; after OR" (01/22/2013)  . Stroke 2008    "left side weak; unable to move left hand still" (01/22/2013)  . Rectal cancer dx'd 08/2007    S/P chemo, radiation, biopsies  . 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 Carotid endarterectomy Right 2008  . Liver cryoablation  08/2010    "chemo shrunk the cancer 50% then they went in and burned the rest of it"  . Axillary-femoral bypass graft  01/29/2012    Procedure: BYPASS GRAFT AXILLA-BIFEMORAL;  Surgeon: Elam Dutch, MD;  [facility];  Service: Vascular;  Laterality: N/A;  Left axilla-bifemoral bypass using gore-tex graft.   . Colonoscopy w/ biopsies and polypectomy      Hx: of  . Femoral-popliteal bypass graft Left 01/06/2013    Procedure: Femoral-Popliteal Artery Bypass;  Surgeon: Elam Dutch, MD;  Location: [place];  Service: Vascular;  Laterality: Left;  . Intraoperative arteriogram Left 01/06/2013    Procedure: INTRA OPERATIVE ARTERIOGRAM;  Surgeon: Elam Dutch, MD;  Location: [place];  Service: Vascular;  Laterality: Left;  . Femoral-popliteal bypass graft Left 01/10/2013    Procedure: THROMBECTOMY POSSIBLE BYPASS GRAFT FEMORAL-POPLITEAL ARTERY;  Surgeon: Mal Misty, MD;  Location: [place];  Service: Vascular;  Laterality: Left;  Attempted Thrombectomy of left Femoral/Popliteal graft - Unsuccessful.  Insertion of Left femoral to below the knee popliteal propaten graft.  . Mass biopsy  2009    "found mass that was cancer; later had chemo and radiation; never cut mass out" (01/22/2013)  . Femoral-popliteal bypass graft Left 09/05/2013    Procedure: Thrombectomy of Left Femoral-Below Knee Popliteal Bypass Graft; Patch Angioplasty of Distal Portion of Bypass Graft;  Surgeon: Harrell Gave  Nicole Cella, MD;  Location: [place];  Service: Vascular;  Laterality: Left;  . Patch angioplasty Left 09/05/2013    Procedure: Patch Angioplasty Distal Portion of Left Femoral-Below Knee Popliteal Bypass Graft;  Surgeon: Angelia Mould, MD;  Location: [place];  Service: Vascular;  Laterality: Left;  . Intraoperative arteriogram Left 09/05/2013    Procedure: INTRA OPERATIVE ARTERIOGRAM;  Surgeon: Angelia Mould, MD;  Location: [place];  Service: Vascular;   Laterality: Left;  . Fasciotomy Left 09/05/2013    Procedure: FASCIOTOMY;  Surgeon: Angelia Mould, MD;  Location: [place];  Service: Vascular;  Laterality: Left;  Marland Kitchen Vascular surgery    . Fasciotomy closure Left 09/11/2013    Procedure: CLOSURE OF FASCIOTOMY SITES LEFT LOWER EXTREMITY AND WOUND VAC APPLICATION;  Surgeon: Angelia Mould, MD;  Location: [place];  Service: Vascular;  Laterality: Left;   History   Social History  . Marital Status: Married    Spouse Name: Donald [name]    Number of Children: 0  . Years of Education: 12   Occupational History  . Retired - [employer]     2004   Social History Main Topics  . Smoking status: Former Smoker -- 0.10 packs/day for 57 years    Types: Cigarettes  . Smokeless tobacco: Never Used     Comment: pt states that he has only smoked 1/2 cig since 02/06/2013  . Alcohol Use: No  . Drug Use: No     Comment: 01/22/2013 "used marijuana in the 1960's"  . Sexual Activity: Not Currently   Other Topics Concern  . None   Social History Narrative   2 siblings, no known CAD. No children.   Patient is married to [place].    Patient has 12 grade education.    Patient is retired.    Patient is right-handed.    Family History  Problem Relation Age of Onset  . Cancer - Other Sister    Allergies  Allergen Reactions  . Other     Became hypotensive. Pt states allergic to "muscle relaxer" but unsure of name.  . Tape Hives and Itching    medfix non woven tape   Prior to Admission medications   Medication Sig Start Date End Date Taking? Authorizing Provider  Ascorbic Acid (VITAMIN C) 1000 MG tablet Take 500 mg by mouth daily.    Yes Historical Provider, MD  cephALEXin (KEFLEX) 500 MG capsule Take 1 capsule (500 mg total) by mouth 4 (four) times daily. 09/24/13  Yes Ivan Anchors Love, PA-C  dipyridamole-aspirin (AGGRENOX) 200-25 MG per 12 hr capsule Take 1 capsule by mouth 2 (two) times daily.   Yes Historical Provider, MD  docusate sodium (COLACE) 100  MG capsule Take 200 mg by mouth 2 (two) times daily as needed for mild constipation.   Yes Historical Provider, MD  ferrous fumarate (HEMOCYTE - 106 MG FE) 325 (106 FE) MG TABS tablet Take 1 tablet (106 mg of iron total) by mouth 2 (two) times daily. 09/24/13  Yes Ivan Anchors Love, PA-C  gabapentin (NEURONTIN) 300 MG capsule Take 1 capsule (300 mg total) by mouth 3 (three) times daily. 05/15/13  Yes Dennie Bible, NP  insulin detemir (LEVEMIR) 100 UNIT/ML injection Inject 10 Units into the skin daily.   Yes Historical Provider, MD  lisinopril (PRINIVIL,ZESTRIL) 5 MG tablet Take 2.5 mg by mouth daily.   Yes Historical Provider, MD  metFORMIN (GLUCOPHAGE) 500 MG tablet Take 500 mg by mouth 2 (two) times daily with  a meal.   Yes Historical Provider, MD  naproxen sodium (ANAPROX) 220 MG tablet Take 220 mg by mouth daily as needed (pain).   Yes Historical Provider, MD  omega-3 acid ethyl esters (LOVAZA) 1 G capsule Take 2 g by mouth 2 (two) times daily.    Yes Historical Provider, MD  omeprazole (PRILOSEC) 40 MG capsule Take 40 mg by mouth daily.   Yes Historical Provider, MD  oxyCODONE (OXY IR/ROXICODONE) 5 MG immediate release tablet Take 1-2 tablets (5-10 mg total) by mouth every 6 (six) hours as needed for severe pain. 09/24/13  Yes Ivan Anchors Love, PA-C  OxyCODONE (OXYCONTIN) 20 mg T12A 12 hr tablet Take 1 tablet (20 mg total) by mouth every 12 (twelve) hours. Long acting pain medication. 09/24/13  Yes Ivan Anchors Love, PA-C  oxyCODONE-acetaminophen (PERCOCET/ROXICET) 5-325 MG per tablet Take 1 tablet by mouth every 4 (four) hours as needed for moderate pain or severe pain.   Yes Historical Provider, MD  saccharomyces boulardii (FLORASTOR) 250 MG capsule Take 1 capsule (250 mg total) by mouth 2 (two) times daily. probiotic 09/24/13  Yes Ivan Anchors Love, PA-C  senna-docusate (SENOKOT-S) 8.6-50 MG per tablet Take 2 tablets by mouth 2 (two) times daily. For constipation. 09/24/13  Yes Ivan Anchors Love, PA-C  traMADol  (ULTRAM) 50 MG tablet Take 1 tablet (50 mg total) by mouth every 6 (six) hours as needed for moderate pain. 09/24/13  Yes Ivan Anchors Love, PA-C  traZODone (DESYREL) 50 MG tablet Take 0.5-1 tablets (25-50 mg total) by mouth at bedtime as needed for sleep. 09/24/13  Yes Ivan Anchors Love, PA-C     Positive ROS:   All other systems have been reviewed and were otherwise negative with the exception of those mentioned in the HPI and as above.  Physical Exam: Filed Vitals:   10/07/13 1843  BP:   Pulse:   Temp:   Resp: 19    General: Alert, no acute distress HEENT: Normal for age Cardiovascular: Regular rate and rhythm. Carotid pulses 2+, no bruits audible-Port-A-Cath in right upper anterior chest wall  Respiratory: Clear to auscultation. No cyanosis, no use of accessory musculature GI: No organomegaly, abdomen is soft and non-tender Skin: No lesions in the area of chief complaint Neurologic: Sensation intact distally Psychiatric: Patient is competent for consent with normal mood and affect Musculoskeletal: No obvious deformities Extremities: 3+ pulse in left axillofemoral graft and left to right femoral-femoral grafts and right femoral artery. Right leg is well perfused with no pain or numbness in good motion and sensation. Left leg is severely ischemic from the knee distally with no motion in the foot and no sensation. He does have sensation from the knee proximally and a 3+ pulse in the left femoral region      Assessment/Plan:  Severe ischemia left leg with recurrent thrombosis left femoral popliteal bypass-most recently treated with thrombectomy and revision 4 weeks ago. The patient refuses chronic Coumadin therapy Patient refuses removal of suspected left axillofemoral bypass  Discuss the situation with Dr. Oneida Alar and Dr. Doren Custard and we all agreed that only solution is left above knee amputation. I've discussed this with the patient and his wife. He is not ready to sign the consent form  yet but states that he may well be ready to sign that in the morning to have this performed on Thursday. We'll keep patient n.p.o. after midnight with expectation of left above-knee amputation tomorrow by Dr. Doren Custard or Trula Slade or Bridgett Larsson depending on availability  Tinnie Gens, MD 10/07/2013 7:48 PM

## 2013-10-07 NOTE — ED Provider Notes (Signed)
CSN: [id number]     Arrival date & time 10/07/13  1657 History   First MD Initiated Contact with Patient 10/07/13 1707     Chief Complaint  Patient presents with  . Leg Pain      HPI Pt was seen at 1655. Per EMS and pt report, c/o sudden onset and worsening of persistent LLE "pain" since approximately midnight last night. Pt states his Home Health RN came out today to change his wound vac dressing and noticed his LLE was cool, mottled, and without a palpable pulse. 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 Denies fevers, no injury, no back pain, no abd pain.     Past Medical History  Diagnosis Date  . GERD (gastroesophageal reflux disease)   . Hyperlipidemia   . Hypertension   . Peripheral vascular disease   . Internal and external bleeding hemorrhoids   . History of gout     left great toe  . Depression   . Numbness in right leg     Hx: of diabetes  . Coronary artery disease     RCA occlusion with good collaterals, o/w no obs CAD 12/2011  . Pneumonia ~ 1977  . Type II diabetes mellitus   . History of blood transfusion 12/2011; 12/2012    "before OR; after OR" (01/22/2013)  . Stroke 2008    "left side weak; unable to move left hand still" (01/22/2013)  . Rectal cancer dx'd 08/2007    S/P chemo, radiation, biopsies  . Metastases to the liver dx'd 08/2010   Past Surgical History  Procedure Laterality Date  . Carotid endarterectomy Right 2008  . Liver cryoablation  08/2010    "chemo shrunk the cancer 50% then they went in and burned the rest of it"  . Axillary-femoral bypass graft  01/29/2012    Procedure: BYPASS GRAFT AXILLA-BIFEMORAL;  Surgeon: Elam Dutch, MD;  Location: [facility] OR;  Service: Vascular;  Laterality: N/A;  Left axilla-bifemoral bypass using gore-tex graft.   . Colonoscopy w/ biopsies and polypectomy      Hx: of  . Femoral-popliteal bypass graft Left 01/06/2013    Procedure:  Femoral-Popliteal Artery Bypass;  Surgeon: Elam Dutch, MD;  Location: [place];  Service: Vascular;  Laterality: Left;  . Intraoperative arteriogram Left 01/06/2013    Procedure: INTRA OPERATIVE ARTERIOGRAM;  Surgeon: Elam Dutch, MD;  Location: [place];  Service: Vascular;  Laterality: Left;  . Femoral-popliteal bypass graft Left 01/10/2013    Procedure: THROMBECTOMY POSSIBLE BYPASS GRAFT FEMORAL-POPLITEAL ARTERY;  Surgeon: Mal Misty, MD;  Location: [place];  Service: Vascular;  Laterality: Left;  Attempted Thrombectomy of left Femoral/Popliteal graft - Unsuccessful.  Insertion of Left femoral to below the knee popliteal propaten graft.  . Mass biopsy  2009    "found mass that was cancer; later had chemo and radiation; never cut mass out" (01/22/2013)  . Femoral-popliteal bypass graft Left 09/05/2013    Procedure: Thrombectomy of Left Femoral-Below Knee Popliteal Bypass Graft; Patch Angioplasty of Distal Portion of Bypass Graft;  Surgeon: Angelia Mould, MD;  Location: [place];  Service: Vascular;  Laterality: Left;  . Patch angioplasty Left 09/05/2013    Procedure: Patch Angioplasty Distal Portion of Left Femoral-Below Knee Popliteal Bypass Graft;  Surgeon: Angelia Mould, MD;  Location: [place];  Service: Vascular;  Laterality: Left;  . Intraoperative arteriogram Left 09/05/2013    Procedure: INTRA OPERATIVE ARTERIOGRAM;  Surgeon: Angelia Mould, MD;  Location: [place];  Service: Vascular;  Laterality: Left;  . Fasciotomy Left 09/05/2013    Procedure: FASCIOTOMY;  Surgeon: Angelia Mould, MD;  Location: [place];  Service: Vascular;  Laterality: Left;  Marland Kitchen Vascular surgery    . Fasciotomy closure Left 09/11/2013    Procedure: CLOSURE OF FASCIOTOMY SITES LEFT LOWER EXTREMITY AND WOUND VAC APPLICATION;  Surgeon: Angelia Mould, MD;  Location: [facility] OR;  Service: Vascular;  Laterality: Left;   Family History  Problem Relation Age of Onset  . Cancer - Other Sister    History   Substance Use Topics  . Smoking status: Former Smoker -- 0.10 packs/day for 57 years    Types: Cigarettes  . Smokeless tobacco: Never Used     Comment: pt states that he has only smoked 1/2 cig since 02/06/2013  . Alcohol Use: No    Review of Systems ROS: Statement: All systems negative except as marked or noted in the HPI; Constitutional: Negative for fever and chills. ; ; Eyes: Negative for eye pain, redness and discharge. ; ; ENMT: Negative for ear pain, hoarseness, nasal congestion, sinus pressure and sore throat. ; ; Cardiovascular: Negative for chest pain, palpitations, diaphoresis, dyspnea and peripheral edema. ; ; Respiratory: Negative for cough, wheezing and stridor. ; ; Gastrointestinal: Negative for nausea, vomiting, diarrhea, abdominal pain, blood in stool, hematemesis, jaundice and rectal bleeding. . ; ; Genitourinary: Negative for dysuria, flank pain and hematuria. ; ; Musculoskeletal: +LLE pain. Negative for back pain and neck pain. Negative for swelling and trauma.; ; Skin: Negative for pruritus, rash, abrasions, blisters, bruising and skin lesion.; ; Neuro: Negative for headache, lightheadedness and neck stiffness. Negative for weakness, altered level of consciousness , altered mental status, extremity weakness, paresthesias, involuntary movement, seizure and syncope.      Allergies  Other and Tape  Home Medications   Prior to Admission medications   Medication Sig Start Date End Date Taking? Authorizing Provider  Ascorbic Acid (VITAMIN C) 1000 MG tablet Take 500 mg by mouth daily.     Historical Provider, MD  cephALEXin (KEFLEX) 500 MG capsule Take 1 capsule (500 mg total) by mouth 4 (four) times daily. 09/24/13   Bary Leriche, PA-C  dipyridamole-aspirin (AGGRENOX) 200-25 MG per 12 hr capsule Take 1 capsule by mouth 2 (two) times daily.    Historical Provider, MD  docusate sodium (COLACE) 100 MG capsule Take 200 mg by mouth 2 (two) times daily as needed for mild  constipation.    Historical Provider, MD  ferrous fumarate (HEMOCYTE - 106 MG FE) 325 (106 FE) MG TABS tablet Take 1 tablet (106 mg of iron total) by mouth 2 (two) times daily. 09/24/13   Bary Leriche, PA-C  gabapentin (NEURONTIN) 300 MG capsule Take 1 capsule (300 mg total) by mouth 3 (three) times daily. 05/15/13   Dennie Bible, NP  insulin detemir (LEVEMIR) 100 UNIT/ML injection Inject 10 Units into the skin daily.    Historical Provider, MD  lisinopril (PRINIVIL,ZESTRIL) 5 MG tablet Take 2.5 mg by mouth daily.    Historical Provider, MD  mesalamine (CANASA) 1000 MG suppository Place 1 suppository (1,000 mg total) rectally at bedtime. 08/04/13   Amy S Esterwood, PA-C  metFORMIN (GLUCOPHAGE) 500 MG tablet Take 500 mg by mouth 2 (two) times daily with a meal.    Historical Provider, MD  omega-3 acid ethyl esters (LOVAZA) 1 G capsule Take 2 g by mouth 2 (two) times  daily.     Historical Provider, MD  omeprazole (PRILOSEC) 40 MG capsule Take 40 mg by mouth daily.    Historical Provider, MD  oxyCODONE (OXY IR/ROXICODONE) 5 MG immediate release tablet Take 1-2 tablets (5-10 mg total) by mouth every 6 (six) hours as needed for severe pain. 09/24/13   Bary Leriche, PA-C  OxyCODONE (OXYCONTIN) 20 mg T12A 12 hr tablet Take 1 tablet (20 mg total) by mouth every 12 (twelve) hours. Long acting pain medication. 09/24/13   Bary Leriche, PA-C  Probiotic Product (ALIGN PO) Take 1 tablet by mouth at bedtime.    Historical Provider, MD  saccharomyces boulardii (FLORASTOR) 250 MG capsule Take 1 capsule (250 mg total) by mouth 2 (two) times daily. probiotic 09/24/13   Ivan Anchors Love, PA-C  senna-docusate (SENOKOT-S) 8.6-50 MG per tablet Take 2 tablets by mouth 2 (two) times daily. For constipation. 09/24/13   Bary Leriche, PA-C  traMADol (ULTRAM) 50 MG tablet Take 1 tablet (50 mg total) by mouth every 6 (six) hours as needed for moderate pain. 09/24/13   Bary Leriche, PA-C  traZODone (DESYREL) 50 MG tablet Take  0.5-1 tablets (25-50 mg total) by mouth at bedtime as needed for sleep. 09/24/13   Ivan Anchors Love, PA-C   BP 152/64  Temp(Src) 98.4 F (36.9 C) (Oral)  Resp 19  SpO2 93% Physical Exam 1700: Physical examination:  Nursing notes reviewed; Vital signs and O2 SAT reviewed;  Constitutional: Well developed, Well nourished, Well hydrated, In no acute distress; Head:  Normocephalic, atraumatic; Eyes: EOMI, PERRL, No scleral icterus; ENMT: Mouth and pharynx normal, Mucous membranes moist; Neck: Supple, Full range of motion, No lymphadenopathy; Cardiovascular: Regular rate and rhythm, No gallop; Respiratory: Breath sounds clear & equal bilaterally, No wheezes.  Speaking full sentences with ease, Normal respiratory effort/excursion; Chest: Nontender, Movement normal; Abdomen: Soft, Nontender, Nondistended, Normal bowel sounds; Genitourinary: No CVA tenderness; Extremities: RLE warm, good color, with palp pulses. No deformity. +1 pedal edema bilat. +LLE knee to toes cool, mottled, no palp popliteal, PT, AT, DP pulses. +multiple left toes with necrotic tips. +palp left femoral pulse. +surgical wounds left medial and lateral leg (wound vac in place).; Neuro: AA&Ox3, vague historian. Major CN grossly intact.  Speech clear. Unable to move left lower leg..; Skin: Color normal, Warm, Dry.    ED Course  Procedures     EKG Interpretation None      MDM  MDM Reviewed: previous chart, nursing note and vitals Reviewed previous: labs Interpretation: labs    Results for orders placed during the hospital encounter of 07/37/10  BASIC METABOLIC PANEL      Result Value Ref Range   Sodium 137  137 - 147 mEq/L   Potassium 4.6  3.7 - 5.3 mEq/L   Chloride 99  96 - 112 mEq/L   CO2 22  19 - 32 mEq/L   Glucose, Bld 116 (*) 70 - 99 mg/dL   BUN 11  6 - 23 mg/dL   Creatinine, Ser 0.94  0.50 - 1.35 mg/dL   Calcium 9.4  8.4 - 10.5 mg/dL   GFR calc non Af Amer 83 (*) >90 mL/min   GFR calc Af Amer >90  >90 mL/min  CBC  WITH DIFFERENTIAL      Result Value Ref Range   WBC 7.7  4.0 - 10.5 K/uL   RBC 3.92 (*) 4.22 - 5.81 MIL/uL   Hemoglobin 9.8 (*) 13.0 - 17.0 g/dL   HCT 32.3 (*)  39.0 - 52.0 %   MCV 82.4  78.0 - 100.0 fL   MCH 25.0 (*) 26.0 - 34.0 pg   MCHC 30.3  30.0 - 36.0 g/dL   RDW 23.1 (*) 11.5 - 15.5 %   Platelets 284  150 - 400 K/uL   Neutrophils Relative % 72  43 - 77 %   Lymphocytes Relative 18  12 - 46 %   Monocytes Relative 9  3 - 12 %   Eosinophils Relative 1  0 - 5 %   Basophils Relative 0  0 - 1 %   Neutro Abs 5.5  1.7 - 7.7 K/uL   Lymphs Abs 1.4  0.7 - 4.0 K/uL   Monocytes Absolute 0.7  0.1 - 1.0 K/uL   Eosinophils Absolute 0.1  0.0 - 0.7 K/uL   Basophils Absolute 0.0  0.0 - 0.1 K/uL   RBC Morphology ELLIPTOCYTES    PROTIME-INR      Result Value Ref Range   Prothrombin Time 13.6  11.6 - 15.2 seconds   INR 1.06  0.00 - 1.49  APTT      Result Value Ref Range   aPTT 26  24 - 37 seconds    1725:  No palp or doppler popliteal, PT, AT, DP pulses left leg. +palp femoral pulse. Left lower leg knee to foot cool to touch, mottled. T/C to Vasc Surgeon Dr. Kellie Simmering, case discussed, including:  HPI, pertinent PM/SHx, VS/PE, dx testing, ED course and treatment:  He has reviewed pt's chart and spoken with both Dr. Oneida Alar and Dr. Scot Dock who are very familiar with pt; he states there is no surgical option available at this time to clear the arterial thrombus in pt's LLE and pt refuses to take coumadin, pt will need an amputation in the next few days (not emergently), requests to admit to medicine service and he will consult tonight.   1910:  Dx and testing, as well as d/w Vascular MD, d/w pt and family.  Questions answered.  Verb understanding, agreeable to admit. T/C to [facility] Dr. Hal Hope, case discussed, including:  HPI, pertinent PM/SHx, VS/PE, dx testing, ED course and treatment, and d/w Vasc MD:  Agreeable to admit, requests to write temporary orders, obtain medical bed to team 10.        Alfonzo Feller, DO 10/09/13 [phone]

## 2013-10-07 NOTE — Telephone Encounter (Signed)
rec'd call from [facility] RN.  Reported pt. has cold temperature and swelling of left lower leg from knee down to foot.  Stated the left foot is ice cold.  Wife reported to [facility] RN that the temperature change in left lower leg and foot is new.  Reported "discoloration" around the wound vac site.  Described a hematoma 6" L x 2.5" W, to the left of the lower leg wound. Stated there is approx. 5 cc. bloody drainage in the wound vac.  Reported pt. had increased pain during the night last light.  Reported no pedal pulse noted in left foot.  Also reports the left gr. toe has a brownish discoloration from nailbed to tip of toe, and 2nd toe is black from nailbed to tip of toe.  Advised with cold foot, and hematoma, should send pt. to the [facility] ER.  Verb. Understanding.

## 2013-10-08 ENCOUNTER — Encounter (HOSPITAL_COMMUNITY): Payer: Self-pay | Admitting: Anesthesiology

## 2013-10-08 ENCOUNTER — Encounter (HOSPITAL_COMMUNITY): Payer: Medicare Other | Admitting: Anesthesiology

## 2013-10-08 ENCOUNTER — Encounter (HOSPITAL_COMMUNITY)
Admission: EM | Disposition: A | Discharge status: Rehabilitation Facility | Payer: Self-pay | Source: Home / Self Care | Attending: Internal Medicine

## 2013-10-08 ENCOUNTER — Inpatient Hospital Stay (HOSPITAL_COMMUNITY): Payer: Medicare Other | Admitting: Anesthesiology

## 2013-10-08 DIAGNOSIS — E1159 Type 2 diabetes mellitus with other circulatory complications: Secondary | ICD-10-CM

## 2013-10-08 DIAGNOSIS — I999 Unspecified disorder of circulatory system: Secondary | ICD-10-CM

## 2013-10-08 DIAGNOSIS — I739 Peripheral vascular disease, unspecified: Secondary | ICD-10-CM

## 2013-10-08 DIAGNOSIS — T82898A Other specified complication of vascular prosthetic devices, implants and grafts, initial encounter: Secondary | ICD-10-CM

## 2013-10-08 HISTORY — PX: AMPUTATION: SHX166

## 2013-10-08 LAB — COMPREHENSIVE METABOLIC PANEL
ALT: 8 U/L (ref 0–53)
AST: 16 U/L (ref 0–37)
Albumin: 3.1 g/dL — ABNORMAL LOW (ref 3.5–5.2)
Alkaline Phosphatase: 82 U/L (ref 39–117)
BUN: 9 mg/dL (ref 6–23)
CO2: 22 meq/L (ref 19–32)
CREATININE: 0.82 mg/dL (ref 0.50–1.35)
Calcium: 9.2 mg/dL (ref 8.4–10.5)
Chloride: 101 mEq/L (ref 96–112)
GFR, EST NON AFRICAN AMERICAN: 88 mL/min — AB (ref >90)
Glucose, Bld: 118 mg/dL — ABNORMAL HIGH (ref 70–99)
Potassium: 4.4 mEq/L (ref 3.7–5.3)
Sodium: 139 mEq/L (ref 137–147)
Total Bilirubin: 0.4 mg/dL (ref 0.3–1.2)
Total Protein: 6.9 g/dL (ref 6.0–8.3)

## 2013-10-08 LAB — GLUCOSE, CAPILLARY
GLUCOSE-CAPILLARY: 111 mg/dL — AB (ref 70–99)
GLUCOSE-CAPILLARY: 134 mg/dL — AB (ref 70–99)
GLUCOSE-CAPILLARY: 145 mg/dL — AB (ref 70–99)
Glucose-Capillary: 115 mg/dL — ABNORMAL HIGH (ref 70–99)
Glucose-Capillary: 142 mg/dL — ABNORMAL HIGH (ref 70–99)

## 2013-10-08 LAB — CBC WITH DIFFERENTIAL/PLATELET
Basophils Absolute: 0 10*3/uL (ref 0.0–0.1)
Basophils Relative: 0 % (ref 0–1)
EOS PCT: 1 % (ref 0–5)
Eosinophils Absolute: 0.1 10*3/uL (ref 0.0–0.7)
HCT: 33.1 % — ABNORMAL LOW (ref 39.0–52.0)
Hemoglobin: 9.9 g/dL — ABNORMAL LOW (ref 13.0–17.0)
LYMPHS PCT: 20 % (ref 12–46)
Lymphs Abs: 1.5 10*3/uL (ref 0.7–4.0)
MCH: 24.8 pg — ABNORMAL LOW (ref 26.0–34.0)
MCHC: 29.9 g/dL — ABNORMAL LOW (ref 30.0–36.0)
MCV: 83 fL (ref 78.0–100.0)
MONO ABS: 0.7 10*3/uL (ref 0.1–1.0)
MONOS PCT: 10 % (ref 3–12)
NEUTROS PCT: 69 % (ref 43–77)
Neutro Abs: 5.1 10*3/uL (ref 1.7–7.7)
PLATELETS: 287 10*3/uL (ref 150–400)
RBC: 3.99 MIL/uL — AB (ref 4.22–5.81)
RDW: 23.4 % — ABNORMAL HIGH (ref 11.5–15.5)
WBC: 7.4 10*3/uL (ref 4.0–10.5)

## 2013-10-08 LAB — SURGICAL PCR SCREEN
MRSA, PCR: NEGATIVE
Staphylococcus aureus: NEGATIVE

## 2013-10-08 LAB — TYPE AND SCREEN
ABO/RH(D): O POS
Antibody Screen: NEGATIVE

## 2013-10-08 SURGERY — AMPUTATION, ABOVE KNEE
Anesthesia: General | Site: Leg Upper | Laterality: Left

## 2013-10-08 MED ORDER — PANTOPRAZOLE SODIUM 40 MG PO TBEC
40.0000 mg | DELAYED_RELEASE_TABLET | Freq: Every day | ORAL | Status: DC
Start: 2013-10-08 — End: 2013-10-13
  Administered 2013-10-08 – 2013-10-13 (×6): 40 mg via ORAL
  Filled 2013-10-08 (×6): qty 1

## 2013-10-08 MED ORDER — LACTATED RINGERS IV SOLN
INTRAVENOUS | Status: DC
  Administered 2013-10-08 – 2013-10-13 (×5): via INTRAVENOUS

## 2013-10-08 MED ORDER — ENOXAPARIN SODIUM 40 MG/0.4ML ~~LOC~~ SOLN
40.0000 mg | SUBCUTANEOUS | Status: DC
  Administered 2013-10-08 – 2013-10-12 (×5): 40 mg via SUBCUTANEOUS
  Filled 2013-10-08 (×6): qty 0.4

## 2013-10-08 MED ORDER — OXYCODONE-ACETAMINOPHEN 5-325 MG PO TABS
1.0000 | ORAL_TABLET | ORAL | Status: DC | PRN
  Administered 2013-10-08 – 2013-10-13 (×6): 2 via ORAL
  Filled 2013-10-08 (×7): qty 2

## 2013-10-08 MED ORDER — GUAIFENESIN-DM 100-10 MG/5ML PO SYRP: 15.0000 mL | ORAL_SOLUTION | ORAL | Status: DC | PRN

## 2013-10-08 MED ORDER — ONDANSETRON HCL 4 MG/2ML IJ SOLN
INTRAMUSCULAR | Status: DC | PRN
  Administered 2013-10-08: 4 mg via INTRAVENOUS

## 2013-10-08 MED ORDER — DOCUSATE SODIUM 100 MG PO CAPS: 100.0000 mg | ORAL_CAPSULE | Freq: Every day | ORAL | Status: DC

## 2013-10-08 MED ORDER — DOCUSATE SODIUM 100 MG PO CAPS
100.0000 mg | ORAL_CAPSULE | Freq: Two times a day (BID) | ORAL | Status: DC
  Administered 2013-10-08 – 2013-10-13 (×10): 100 mg via ORAL
  Filled 2013-10-08 (×11): qty 1

## 2013-10-08 MED ORDER — CEFAZOLIN SODIUM-DEXTROSE 2-3 GM-% IV SOLR
2.0000 g | INTRAVENOUS | Status: AC
  Administered 2013-10-08: 2 g via INTRAVENOUS

## 2013-10-08 MED ORDER — SENNA 8.6 MG PO TABS
2.0000 | ORAL_TABLET | Freq: Every day | ORAL | Status: DC
  Administered 2013-10-08 – 2013-10-12 (×5): 17.2 mg via ORAL
  Filled 2013-10-08 (×7): qty 2

## 2013-10-08 MED ORDER — 0.9 % SODIUM CHLORIDE (POUR BTL) OPTIME
TOPICAL | Status: DC | PRN
  Administered 2013-10-08: 1000 mL

## 2013-10-08 MED ORDER — OXYCODONE HCL ER 10 MG PO T12A
20.0000 mg | EXTENDED_RELEASE_TABLET | Freq: Two times a day (BID) | ORAL | Status: DC
  Administered 2013-10-08 – 2013-10-12 (×8): 20 mg via ORAL
  Filled 2013-10-08 (×8): qty 2

## 2013-10-08 MED ORDER — LACTATED RINGERS IV SOLN
INTRAVENOUS | Status: DC | PRN
  Administered 2013-10-08: 10:00:00 via INTRAVENOUS

## 2013-10-08 MED ORDER — PHENOL 1.4 % MT LIQD: 1.0000 | OROMUCOSAL | Status: DC | PRN

## 2013-10-08 MED ORDER — DEXTROSE 5 % IV SOLN
1.5000 g | Freq: Two times a day (BID) | INTRAVENOUS | Status: AC
  Administered 2013-10-08 – 2013-10-09 (×2): 1.5 g via INTRAVENOUS
  Filled 2013-10-08 (×2): qty 1.5

## 2013-10-08 MED ORDER — PROPOFOL 10 MG/ML IV BOLUS
INTRAVENOUS | Status: DC | PRN
  Administered 2013-10-08: 80 mg via INTRAVENOUS
  Administered 2013-10-08: 120 mg via INTRAVENOUS

## 2013-10-08 MED ORDER — TRAZODONE 25 MG HALF TABLET
25.0000 mg | ORAL_TABLET | Freq: Every day | ORAL | Status: DC
  Administered 2013-10-08 – 2013-10-12 (×5): 25 mg via ORAL
  Filled 2013-10-08 (×6): qty 1

## 2013-10-08 MED ORDER — MORPHINE SULFATE 2 MG/ML IJ SOLN
2.0000 mg | Freq: Once | INTRAMUSCULAR | Status: AC
  Administered 2013-10-08: 2 mg via INTRAVENOUS
  Filled 2013-10-08: qty 1

## 2013-10-08 MED ORDER — SUCCINYLCHOLINE CHLORIDE 20 MG/ML IJ SOLN
INTRAMUSCULAR | Status: DC | PRN
  Administered 2013-10-08: 100 mg via INTRAVENOUS

## 2013-10-08 MED ORDER — METOPROLOL TARTRATE 1 MG/ML IV SOLN
2.0000 mg | INTRAVENOUS | Status: DC | PRN
Start: 2013-10-08 — End: 2013-10-13

## 2013-10-08 MED ORDER — ALBUMIN HUMAN 5 % IV SOLN
INTRAVENOUS | Status: DC | PRN
  Administered 2013-10-08: 11:00:00 via INTRAVENOUS

## 2013-10-08 MED ORDER — LABETALOL HCL 5 MG/ML IV SOLN
10.0000 mg | INTRAVENOUS | Status: DC | PRN
  Filled 2013-10-08: qty 4

## 2013-10-08 MED ORDER — SODIUM CHLORIDE 0.9 % IV SOLN
INTRAVENOUS | Status: DC | PRN
  Administered 2013-10-08: 11:00:00 via INTRAVENOUS

## 2013-10-08 MED ORDER — ASPIRIN-DIPYRIDAMOLE ER 25-200 MG PO CP12
1.0000 | ORAL_CAPSULE | Freq: Two times a day (BID) | ORAL | Status: DC
  Administered 2013-10-08 – 2013-10-13 (×10): 1 via ORAL
  Filled 2013-10-08 (×11): qty 1

## 2013-10-08 MED ORDER — ARTIFICIAL TEARS OP OINT
TOPICAL_OINTMENT | OPHTHALMIC | Status: DC | PRN
  Administered 2013-10-08: 1 via OPHTHALMIC

## 2013-10-08 MED ORDER — LIDOCAINE HCL (CARDIAC) 20 MG/ML IV SOLN
INTRAVENOUS | Status: DC | PRN
  Administered 2013-10-08: 25 mg via INTRAVENOUS
  Administered 2013-10-08: 75 mg via INTRAVENOUS

## 2013-10-08 MED ORDER — FENTANYL CITRATE 0.05 MG/ML IJ SOLN
INTRAMUSCULAR | Status: AC
  Filled 2013-10-08: qty 2

## 2013-10-08 MED ORDER — POTASSIUM CHLORIDE CRYS ER 20 MEQ PO TBCR: 20.0000 meq | EXTENDED_RELEASE_TABLET | Freq: Every day | ORAL | Status: DC | PRN

## 2013-10-08 MED ORDER — FENTANYL CITRATE 0.05 MG/ML IJ SOLN
INTRAMUSCULAR | Status: DC | PRN
  Administered 2013-10-08 (×5): 50 ug via INTRAVENOUS

## 2013-10-08 MED ORDER — MIDAZOLAM HCL 2 MG/2ML IJ SOLN
INTRAMUSCULAR | Status: AC
  Filled 2013-10-08: qty 2

## 2013-10-08 MED ORDER — MAGNESIUM SULFATE 40 MG/ML IJ SOLN
2.0000 g | Freq: Every day | INTRAMUSCULAR | Status: DC | PRN
  Filled 2013-10-08: qty 50

## 2013-10-08 MED ORDER — FENTANYL CITRATE 0.05 MG/ML IJ SOLN
25.0000 ug | INTRAMUSCULAR | Status: DC | PRN
  Administered 2013-10-08 (×4): 25 ug via INTRAVENOUS

## 2013-10-08 MED ORDER — OXYCODONE HCL 5 MG PO TABS
5.0000 mg | ORAL_TABLET | ORAL | Status: DC | PRN
  Administered 2013-10-08 – 2013-10-09 (×3): 5 mg via ORAL
  Filled 2013-10-08 (×3): qty 1

## 2013-10-08 MED ORDER — ALUM & MAG HYDROXIDE-SIMETH 200-200-20 MG/5ML PO SUSP: 15.0000 mL | ORAL | Status: DC | PRN

## 2013-10-08 MED ORDER — FENTANYL CITRATE 0.05 MG/ML IJ SOLN
INTRAMUSCULAR | Status: AC
  Filled 2013-10-08: qty 5

## 2013-10-08 MED ORDER — PROPOFOL 10 MG/ML IV BOLUS
INTRAVENOUS | Status: AC
  Filled 2013-10-08: qty 20

## 2013-10-08 SURGICAL SUPPLY — 52 items
BANDAGE ELASTIC 4 VELCRO ST LF (GAUZE/BANDAGES/DRESSINGS) ×1 IMPLANT
BANDAGE ELASTIC 6 VELCRO ST LF (GAUZE/BANDAGES/DRESSINGS) ×3 IMPLANT
BANDAGE GAUZE ELAST BULKY 4 IN (GAUZE/BANDAGES/DRESSINGS) ×4 IMPLANT
BLADE 10 SAFETY STRL DISP (BLADE) IMPLANT
BNDG COHESIVE 6X5 TAN STRL LF (GAUZE/BANDAGES/DRESSINGS) ×3 IMPLANT
CANISTER SUCTION 2500CC (MISCELLANEOUS) ×3 IMPLANT
CLIP TI MEDIUM 6 (CLIP) ×3 IMPLANT
COVER SURGICAL LIGHT HANDLE (MISCELLANEOUS) ×3 IMPLANT
DRAIN CHANNEL 19F RND (DRAIN) IMPLANT
DRAPE ORTHO SPLIT 77X108 STRL (DRAPES) ×6
DRAPE PROXIMA HALF (DRAPES) ×3 IMPLANT
DRAPE SURG ORHT 6 SPLT 77X108 (DRAPES) ×2 IMPLANT
DRSG ADAPTIC 3X8 NADH LF (GAUZE/BANDAGES/DRESSINGS) ×3 IMPLANT
ELECT REM PT RETURN 9FT ADLT (ELECTROSURGICAL) ×3
ELECTRODE REM PT RTRN 9FT ADLT (ELECTROSURGICAL) ×1 IMPLANT
EVACUATOR SILICONE 100CC (DRAIN) IMPLANT
GAUZE SPONGE 4X4 16PLY XRAY LF (GAUZE/BANDAGES/DRESSINGS) ×1 IMPLANT
GLOVE BIOGEL PI IND STRL 6.5 (GLOVE) IMPLANT
GLOVE BIOGEL PI IND STRL 7.0 (GLOVE) ×1 IMPLANT
GLOVE BIOGEL PI IND STRL 7.5 (GLOVE) ×1 IMPLANT
GLOVE BIOGEL PI INDICATOR 6.5 (GLOVE) ×4
GLOVE BIOGEL PI INDICATOR 7.0 (GLOVE) ×2
GLOVE BIOGEL PI INDICATOR 7.5 (GLOVE) ×2
GLOVE ECLIPSE 6.5 STRL STRAW (GLOVE) ×2 IMPLANT
GLOVE SURG SS PI 7.5 STRL IVOR (GLOVE) ×3 IMPLANT
GOWN STRL REUS W/ TWL LRG LVL3 (GOWN DISPOSABLE) ×1 IMPLANT
GOWN STRL REUS W/ TWL XL LVL3 (GOWN DISPOSABLE) ×3 IMPLANT
GOWN STRL REUS W/TWL LRG LVL3 (GOWN DISPOSABLE) ×3
GOWN STRL REUS W/TWL XL LVL3 (GOWN DISPOSABLE) ×9
KIT BASIN OR (CUSTOM PROCEDURE TRAY) ×3 IMPLANT
KIT ROOM TURNOVER OR (KITS) ×3 IMPLANT
NS IRRIG 1000ML POUR BTL (IV SOLUTION) ×3 IMPLANT
PACK GENERAL/GYN (CUSTOM PROCEDURE TRAY) ×3 IMPLANT
PAD ARMBOARD 7.5X6 YLW CONV (MISCELLANEOUS) ×6 IMPLANT
SAW GIGLI STERILE 20 (MISCELLANEOUS) ×3 IMPLANT
SPONGE GAUZE 4X4 12PLY (GAUZE/BANDAGES/DRESSINGS) ×2 IMPLANT
SPONGE GAUZE 4X4 12PLY STER LF (GAUZE/BANDAGES/DRESSINGS) ×2 IMPLANT
SPONGE LAP 18X18 X RAY DECT (DISPOSABLE) ×2 IMPLANT
STAPLER VISISTAT 35W (STAPLE) ×2 IMPLANT
STOCKINETTE IMPERVIOUS LG (DRAPES) ×3 IMPLANT
SUT ETHILON 3 0 PS 1 (SUTURE) IMPLANT
SUT SILK 0 TIES 10X30 (SUTURE) IMPLANT
SUT SILK 2 0 (SUTURE) ×3
SUT SILK 2-0 18XBRD TIE 12 (SUTURE) ×1 IMPLANT
SUT SILK 3 0 (SUTURE)
SUT SILK 3-0 18XBRD TIE 12 (SUTURE) IMPLANT
SUT VIC AB 2-0 CT1 18 (SUTURE) ×8 IMPLANT
TAPE UMBILICAL COTTON 1/8X30 (MISCELLANEOUS) ×3 IMPLANT
TOWEL OR 17X24 6PK STRL BLUE (TOWEL DISPOSABLE) ×3 IMPLANT
TOWEL OR 17X26 10 PK STRL BLUE (TOWEL DISPOSABLE) ×3 IMPLANT
UNDERPAD 30X30 INCONTINENT (UNDERPADS AND DIAPERS) ×3 IMPLANT
WATER STERILE IRR 1000ML POUR (IV SOLUTION) ×3 IMPLANT

## 2013-10-08 NOTE — Anesthesia Procedure Notes (Addendum)
Procedure Name: LMA Insertion Date/Time: 10/08/2013 10:35 AM Performed by: Scheryl Darter Pre-anesthesia Checklist: Patient identified, Emergency Drugs available, Suction available, Patient being monitored and Timeout performed Patient Re-evaluated:Patient Re-evaluated prior to inductionOxygen Delivery Method: Circle system utilized Preoxygenation: Pre-oxygenation with 100% oxygen Intubation Type: IV induction Ventilation: Mask ventilation without difficulty LMA: LMA with gastric port inserted LMA Size: 5.0 Number of attempts: 1 Placement Confirmation: positive ETCO2 and breath sounds checked- equal and bilateral Tube secured with: Tape Dental Injury: Teeth and Oropharynx as per pre-operative assessment    Procedure Name: Intubation Date/Time: 10/08/2013 10:59 AM Performed by: Ebbie Latus E Pre-anesthesia Checklist: Patient identified, Emergency Drugs available, Suction available, Patient being monitored and Timeout performed Patient Re-evaluated:Patient Re-evaluated prior to inductionOxygen Delivery Method: Circle system utilized Preoxygenation: Pre-oxygenation with 100% oxygen Intubation Type: IV induction Ventilation: Mask ventilation without difficulty Laryngoscope Size: Mac and 3 Grade View: Grade I Tube type: Oral Tube size: 7.5 mm Number of attempts: 1 Airway Equipment and Method: Stylet Placement Confirmation: ETT inserted through vocal cords under direct vision,  positive ETCO2 and breath sounds checked- equal and bilateral Secured at: 21 cm Tube secured with: Tape Dental Injury: Teeth and Oropharynx as per pre-operative assessment  Comments: LMA inadequate/removed/# 7.5 OETT placed with ease/Dr Oletta Lamas present

## 2013-10-08 NOTE — Op Note (Signed)
    Patient name: Donald [name] MRN: [id number] DOB: [birth year] Sex: male  10/07/2013 - 10/08/2013 Pre-operative Diagnosis: Ischemic left leg Post-operative diagnosis:  Same Surgeon:  Eldridge Abrahams Assistants:  Lennie Muckle Procedure:   Left above-knee amputation Anesthesia:  Gen.  Blood Loss:  See anesthesia record Specimens:  Left leg  Findings:  All tissue appeared viable, however there was not a significant amount of bleeding.  The patient had somewhat demarcated his skin approximately several centimeters above his patella.  The amputation level was relatively high for an above-knee amputation  Indications:  The patient has previous undergone revascularization which has failed.  He came into the ER with a nonviable ischemic left leg.  He comes in for amputation today.  Procedure:  The patient was identified in the holding area and taken to [place] [facility]  The patient was then placed supine on the table. general anesthesia was administered.  The patient was prepped and draped in the usual sterile fashion.  A time out was called and antibiotics were administered.  I marked the area where I felt there was demarcation from his skin.  This was about a handbreadth proximal to the patella.  A fishmouth incision was made.  Cautery is used to divide the subcutaneous tissue.  The muscle was divided with cautery.  A circumferential exposure of the bone was obtained.  Periosteal elevator was used to elevate the periosteum.  A Gigli saw was used to transect the bone, beveling the anterior surface.  I then isolated the neurovascular bundle.  The artery nerve, and vein were dissected free and ligated proximal to the cut edge of the femur.  I was concerned about bone protrusion so I resected approximately another 1 cm of the femur.  Was then irrigated.  Hemostasis achieved.  The fascia was closed with interrupted 2-0 Vicryl.  Skin was closed with staples.  Sterile dressings applied.   Disposition:  To  PACU in stable condition.   [name], M.D. [facility] [place] Office: [phone] Pager:  [phone]

## 2013-10-08 NOTE — Progress Notes (Signed)
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 Trula Slade -continue aggrenox -pt refuses warfarin -pain control -cathartics Diabetes mellitus type 2 -09/06/2013 Hemoglobin of 7.7 -Half home dose of Levemir -NovoLog sliding scale Tobacco abuse -Tobacco cessation discussed -Declines nicotine patch Hypertension -Restart lisinopril Chronic pain -Restart OxyContin 20 mg every 12 hours -OxyIR 5 mg every 4 hours when necessary pain Normocytic anemia -Likely due to chronic disease -Check iron studies in the morning -Monitor CBC Hx of rectal cancer -oncologist is Dr. Alen [name]   Family Communication:   Wife at beside Disposition Plan:   Home when medically stable        Procedures/Studies:  No results found.      Subjective: Patient complains of pain at the surgical site. Denies any fevers, chills, chest pain, shortness breath, vomiting, diarrhea, abdominal pain, dysuria, hematuria. He has some nausea. No headaches  Objective: Filed Vitals:   10/08/13 1245 10/08/13 1300 10/08/13 1315 10/08/13 1346  BP: 156/71 145/88 138/68 132/70  Pulse: 88 93 95 85  Temp:   98.6 F (37 C) 97.9 F (36.6 C)  TempSrc:      Resp: 14 15 9 16   Height:      Weight:      SpO2: 98% 98% 96% 95%    Intake/Output Summary (Last 24 hours) at 10/08/13 1500 Last data filed at 10/08/13 1201  Gross per 24 hour  Intake 1357.5 ml  Output   1275 ml  Net   82.5 ml   Weight change:  Exam:   General:  Pt is alert, follows commands appropriately, not in acute distress  HEENT: No icterus, No thrush,  [place]/AT  Cardiovascular: RRR, S1/S2, no rubs, no gallops  Respiratory: CTA bilaterally, no wheezing, no crackles, no rhonchi  Abdomen: Soft/+BS, non tender, non distended, no guarding  Extremities:  RLE--No edema, No lymphangitis, No petechiae, No rashes, no synovitis;  L-AKA site bulky dressing;  L-groin scar without erythema or drainage  Data Reviewed: Basic Metabolic Panel:  Recent Labs Lab 10/07/13 1748 10/08/13 0430  NA 137 139  K 4.6 4.4  CL 99 101  CO2 22 22  GLUCOSE 116* 118*  BUN 11 9  CREATININE 0.94 0.82  CALCIUM 9.4 9.2   Liver Function Tests:  Recent Labs Lab 10/08/13 0430  AST 16  ALT 8  ALKPHOS 82  BILITOT 0.4  PROT 6.9  ALBUMIN 3.1*   No results found for this basename: LIPASE, AMYLASE,  in the last 168 hours No results found for this basename: AMMONIA,  in the last 168 hours CBC:  Recent Labs Lab 10/07/13 1748 10/08/13 0430  WBC 7.7 7.4  NEUTROABS 5.5 5.1  HGB 9.8* 9.9*  HCT 32.3* 33.1*  MCV 82.4 83.0  PLT 284 287   Cardiac Enzymes: No results found for this basename: CKTOTAL, CKMB, CKMBINDEX, TROPONINI,  in the last 168 hours BNP: No components found with this basename: POCBNP,  CBG:  Recent Labs Lab 10/07/13 2318 10/08/13 0359 10/08/13 0808 10/08/13 1200  GLUCAP 115* 111* 115* 134*    Recent Results (from the past 240 hour(s))  SURGICAL PCR SCREEN     Status: None   Collection Time    10/08/13 12:12 AM      Result Value Ref Range Status   MRSA, PCR NEGATIVE  NEGATIVE Final  Staphylococcus aureus NEGATIVE  NEGATIVE Final   Comment:            The Xpert SA Assay (FDA     approved for NASAL specimens     in patients over 1 years of age),     is one component of     a comprehensive surveillance     program.  Test performance has     been validated by [employer] for patients greater     than or equal to 54 year old.     It is not intended     to diagnose infection nor to     guide or monitor treatment.     Scheduled Meds: . cefUROXime (ZINACEF)  IV  1.5 g Intravenous Q12H  . dipyridamole-aspirin  1 capsule Oral BID  . [START ON 10/09/2013] docusate sodium  100 mg Oral Daily  . docusate sodium  100 mg Oral  BID  . enoxaparin (LOVENOX) injection  40 mg Subcutaneous Q24H  . fentaNYL      . insulin aspart  0-9 Units Subcutaneous TID WC  . insulin glargine  5 Units Subcutaneous QHS  .  morphine injection  2 mg Intravenous Once  . OxyCODONE  20 mg Oral Q12H  . pantoprazole  40 mg Oral Daily  . senna  2 tablet Oral QHS  . traZODone  25 mg Oral QHS   Continuous Infusions: . sodium chloride 50 mL/hr at 10/08/13 1357  . lactated ringers 50 mL/hr at 10/08/13 1020     Donald Keidel, DO  [facility] Pager [phone]  If 7PM-7AM, please contact night-coverage [email or web address] Password [email or web address] 10/08/2013, 3:00 PM   LOS: 1 day

## 2013-10-08 NOTE — Anesthesia Preprocedure Evaluation (Signed)
Anesthesia Evaluation  Patient identified by MRN, date of birth, ID band Patient awake    Reviewed: Allergy & Precautions, H&P , NPO status , Patient's Chart, lab work & pertinent test results  Airway Mallampati: II      Dental   Pulmonary pneumonia -, former smoker,  breath sounds clear to auscultation        Cardiovascular hypertension, + CAD and + Peripheral Vascular Disease Rhythm:Regular Rate:Normal     Neuro/Psych    GI/Hepatic Neg liver ROS, GERD-  ,  Endo/Other  diabetes  Renal/GU negative Renal ROS     Musculoskeletal   Abdominal   Peds  Hematology   Anesthesia Other Findings   Reproductive/Obstetrics                           Anesthesia Physical Anesthesia Plan  ASA: III  Anesthesia Plan: General   Post-op Pain Management:    Induction: Intravenous  Airway Management Planned: LMA  Additional Equipment:   Intra-op Plan:   Post-operative Plan: Extubation in OR  Informed Consent: I have reviewed the patients History and Physical, chart, labs and discussed the procedure including the risks, benefits and alternatives for the proposed anesthesia with the patient or authorized representative who has indicated his/her understanding and acceptance.   Dental advisory given  Plan Discussed with: CRNA and Anesthesiologist  Anesthesia Plan Comments:         Anesthesia Quick Evaluation

## 2013-10-08 NOTE — Progress Notes (Signed)
INITIAL NUTRITION ASSESSMENT  DOCUMENTATION CODES Per approved criteria  -Severe malnutrition in the context of chronic illness   Pt meets criteria for severe MALNUTRITION in the context of chronic illness as evidenced by weight loss of 8.8% x 1 month and energy intake </=75% of minimum estimated needs >/= 1 month.   INTERVENTION:  Diet advancement per MD  RD to continue to follow  NUTRITION DIAGNOSIS: Inadequate oral intake related to poor appetite as evidenced by weight loss of 8.8% x 1 month.   Goal: Pt to meet >/=90% of estimated nutrition needs   Monitor:  Diet advancement, weight trend, labs   Reason for Assessment: Positive Malnutrition Screening Tool Score   68 y.o. male  Admitting Dx: Ischemic leg  ASSESSMENT: Patient is a 68 y.o. male who was recently admitted for ischemic limb and had undergone left sided thrombectomy and left femoral popliteal bypass graft with patch angioplasty and fasciotomy with wound VAC was eventually discharged to rehabilitation and patient has been at home last 10 days started experiencing worsening pain of his left lower is to be since morning which was cold.   Pt is s/p the following on 6/11:  AMPUTATION ABOVE KNEE  Pt reports poor appetite and poor PO intake for the last month and worsening the past several days. Pt states that he cannot eat much and was very constipated until today when had a BM. Pt reports weight loss. Epic chart documents a weight loss of 8.8% x 1 month which is severe for time frame.   Pt reports not liking the food here and cannot eat well while in hospital. When diet advances, RD to work with pt to come up with options for better PO intake.   No muscle or subcutaneous fat depletion noticed.   Height: Ht Readings from Last 1 Encounters:  10/07/13 5\' 9"  (1.753 m)    Weight: Wt Readings from Last 1 Encounters:  10/07/13 166 lb (75.297 kg)    Ideal Body Weight: 66.9, adjusted for L AKA   % Ideal Body  Weight: 103%   Wt Readings from Last 10 Encounters:  10/07/13 166 lb (75.297 kg)  10/07/13 166 lb (75.297 kg)  10/01/13 173 lb (78.472 kg)  09/23/13 174 lb 1.6 oz (78.971 kg)  09/11/13 175 lb (79.379 kg)  09/11/13 175 lb (79.379 kg)  09/11/13 175 lb (79.379 kg)  09/04/13 182 lb (82.555 kg)  08/04/13 183 lb (83.008 kg)  06/11/13 185 lb (83.915 kg)    Usual Body Weight: 184 lbs, per pt   % Usual Body Weight: 90%   BMI:  Body mass index is 24.5 kg/(m^2). Normal, prior to amputation, no current weight s/p amputation to determine BMI    Estimated Nutritional Needs: Kcal: 1800 - 2000 Protein: 80 - 90 grams  Fluid: >/= 1.9 L/day   Skin: stage II pressure ulcer mid coccyx, open wound left groin  Diet Order: NPO  EDUCATION NEEDS: -No education needs identified at this time   Intake/Output Summary (Last 24 hours) at 10/08/13 1511 Last data filed at 10/08/13 1201  Gross per 24 hour  Intake 1357.5 ml  Output   1275 ml  Net   82.5 ml    Last BM: 6/11, per pt    Labs:   Recent Labs Lab 10/07/13 1748 10/08/13 0430  NA 137 139  K 4.6 4.4  CL 99 101  CO2 22 22  BUN 11 9  CREATININE 0.94 0.82  CALCIUM 9.4 9.2  GLUCOSE 116* 118*  CBG (last 3)   Recent Labs  10/08/13 0359 10/08/13 0808 10/08/13 1200  GLUCAP 111* 115* 134*    Scheduled Meds: . cefUROXime (ZINACEF)  IV  1.5 g Intravenous Q12H  . dipyridamole-aspirin  1 capsule Oral BID  . docusate sodium  100 mg Oral BID  . enoxaparin (LOVENOX) injection  40 mg Subcutaneous Q24H  . fentaNYL      . insulin aspart  0-9 Units Subcutaneous TID WC  . insulin glargine  5 Units Subcutaneous QHS  .  morphine injection  2 mg Intravenous Once  . OxyCODONE  20 mg Oral Q12H  . pantoprazole  40 mg Oral Daily  . senna  2 tablet Oral QHS  . traZODone  25 mg Oral QHS    Continuous Infusions: . sodium chloride 50 mL/hr at 10/08/13 1357  . lactated ringers 50 mL/hr at 10/08/13 1020    Past Medical History   Diagnosis Date  . GERD (gastroesophageal reflux disease)   . Hyperlipidemia   . Hypertension   . Peripheral vascular disease   . Internal and external bleeding hemorrhoids   . History of gout     left great toe  . Depression   . Numbness in right leg     Hx: of diabetes  . Coronary artery disease     RCA occlusion with good collaterals, o/w no obs CAD 12/2011  . Pneumonia ~ 1977  . Type II diabetes mellitus   . History of blood transfusion 12/2011; 12/2012    "before OR; after OR" (01/22/2013)  . Stroke 2008    "left side weak; unable to move left hand still" (01/22/2013)  . Rectal cancer dx'd 08/2007    S/P chemo, radiation, biopsies  . Metastases to the liver dx'd 08/2010    Past Surgical History  Procedure Laterality Date  . Carotid endarterectomy Right 2008  . Liver cryoablation  08/2010    "chemo shrunk the cancer 50% then they went in and burned the rest of it"  . Axillary-femoral bypass graft  01/29/2012    Procedure: BYPASS GRAFT AXILLA-BIFEMORAL;  Surgeon: Elam Dutch, MD;  Location: [facility] OR;  Service: Vascular;  Laterality: N/A;  Left axilla-bifemoral bypass using gore-tex graft.   . Colonoscopy w/ biopsies and polypectomy      Hx: of  . Femoral-popliteal bypass graft Left 01/06/2013    Procedure: Femoral-Popliteal Artery Bypass;  Surgeon: Elam Dutch, MD;  Location: [place];  Service: Vascular;  Laterality: Left;  . Intraoperative arteriogram Left 01/06/2013    Procedure: INTRA OPERATIVE ARTERIOGRAM;  Surgeon: Elam Dutch, MD;  Location: [place];  Service: Vascular;  Laterality: Left;  . Femoral-popliteal bypass graft Left 01/10/2013    Procedure: THROMBECTOMY POSSIBLE BYPASS GRAFT FEMORAL-POPLITEAL ARTERY;  Surgeon: Mal Misty, MD;  Location: [place];  Service: Vascular;  Laterality: Left;  Attempted Thrombectomy of left Femoral/Popliteal graft - Unsuccessful.  Insertion of Left femoral to below the knee popliteal propaten graft.  . Mass biopsy  2009    "found mass  that was cancer; later had chemo and radiation; never cut mass out" (01/22/2013)  . Femoral-popliteal bypass graft Left 09/05/2013    Procedure: Thrombectomy of Left Femoral-Below Knee Popliteal Bypass Graft; Patch Angioplasty of Distal Portion of Bypass Graft;  Surgeon: Angelia Mould, MD;  Location: [place];  Service: Vascular;  Laterality: Left;  . Patch angioplasty Left 09/05/2013    Procedure: Patch Angioplasty Distal Portion of Left Femoral-Below Knee Popliteal Bypass Graft;  Surgeon:  Angelia Mould, MD;  Location: [place];  Service: Vascular;  Laterality: Left;  . Intraoperative arteriogram Left 09/05/2013    Procedure: INTRA OPERATIVE ARTERIOGRAM;  Surgeon: Angelia Mould, MD;  Location: [place];  Service: Vascular;  Laterality: Left;  . Fasciotomy Left 09/05/2013    Procedure: FASCIOTOMY;  Surgeon: Angelia Mould, MD;  Location: [place];  Service: Vascular;  Laterality: Left;  Marland Kitchen Vascular surgery    . Fasciotomy closure Left 09/11/2013    Procedure: CLOSURE OF FASCIOTOMY SITES LEFT LOWER EXTREMITY AND WOUND VAC APPLICATION;  Surgeon: Angelia Mould, MD;  Location: [place];  Service: Vascular;  Laterality: Left;    Carrolyn Leigh, BS Dietetic Intern Pager: [phone]

## 2013-10-08 NOTE — Interval H&P Note (Signed)
History and Physical Interval Note:  10/08/2013 9:49 AM  Donald [name]  has presented today for surgery, with the diagnosis of peripherial vascular disease  The various methods of treatment have been discussed with the patient and family. After consideration of risks, benefits and other options for treatment, the patient has consented to  Procedure(s): AMPUTATION ABOVE KNEE (Left) as a surgical intervention .  The patient's history has been reviewed, patient examined, no change in status, stable for surgery.  I have reviewed the patient's chart and labs.  Questions were answered to the patient's satisfaction.     BRABHAM IV, V. WELLS

## 2013-10-08 NOTE — Consult Note (Signed)
Reason for Consult: Severely ischemic left leg   HPI:  Donald [name] is a 68 y.o. male who presents for evaluation of severely ischemic left leg. Sponge is well known to our practice having previously undergone left axillobifemoral bypass grafting by Dr. Juanda [name] feels and left femoral popliteal bypass grafting initially was saphenous vein and then replaced with Gore-Tex in September of 2014. 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 Oneida [name]. He also has refused Coumadin in the past. He occluded his left femoral-popliteal graft may 9,2015 and had thrombectomy of the graft through the left popliteal route by Dr. Doren [name] with a patch angioplasty. The patient required 4 compartment fasciotomy in the back wound coverag  He was seen by Dr. Doren [name] in the office 6 days ago with graft functioning. Last night at midnight-18 hours ago the patient developed pain and numbness in the left foot. He came to the emergency department late this afternoon with known occlusion of his left femoral-popliteal bypass with pain and numbness.    Past Medical History   Diagnosis  Date   .  GERD (gastroesophageal reflux disease)    .  Hyperlipidemia    .  Hypertension    .  Peripheral vascular disease    .  Internal and external bleeding hemorrhoids    .  History of gout      left great toe   .  Depression    .  Numbness in right leg      Hx: of diabetes   .  Coronary artery disease      RCA occlusion with good collaterals, o/w no obs CAD 12/2011   .  Pneumonia  ~ 1977   .  Type II diabetes mellitus    .  History of blood transfusion  12/2011; 12/2012     "before OR; after OR" (01/22/2013)   .  Stroke  2008     "left side weak; unable to move left hand still" (01/22/2013)   .  Rectal cancer  dx'd 08/2007     S/P chemo, radiation, biopsies   .    Metastases to the liver  dx'd 08/2010    Past Surgical History     Procedure  Laterality  Date   .  Carotid endarterectomy  Right  2008   .  Liver cryoablation   08/2010     "chemo shrunk the cancer 50% then they went in and burned the rest of it"   .  Axillary-femoral bypass graft   01/29/2012     Procedure: BYPASS GRAFT AXILLA-BIFEMORAL; Surgeon: Donald Dutch, MD; Location: [facility] OR; Service: Vascular; Laterality: N/A; Left axilla-bifemoral bypass using gore-tex graft.   .  Colonoscopy w/ biopsies and polypectomy       Hx: of   .  Femoral-popliteal bypass graft  Left  01/06/2013     Procedure: Femoral-Popliteal Artery Bypass; Surgeon: Donald Dutch, MD; Location: [place]; Service: Vascular; Laterality: Left;   .  Intraoperative arteriogram  Left  01/06/2013     Procedure: INTRA OPERATIVE ARTERIOGRAM; Surgeon: Donald Dutch, MD; Location: [place]; Service: Vascular; Laterality: Left;   .  Femoral-popliteal bypass graft  Left  01/10/2013     Procedure: THROMBECTOMY POSSIBLE BYPASS GRAFT FEMORAL-POPLITEAL ARTERY; Surgeon: Donald Misty, MD; Location: [place]; Service: Vascular; Laterality: Left; Attempted Thrombectomy of left Femoral/Popliteal graft - Unsuccessful. Insertion of Left femoral  to below the knee popliteal propaten graft.   .  Mass biopsy   2009     "found mass that was cancer; later had chemo and radiation; never cut mass out" (01/22/2013)   .  Femoral-popliteal bypass graft  Left  09/05/2013     Procedure: Thrombectomy of Left Femoral-Below Knee Popliteal Bypass Graft; Patch Angioplasty of Distal Portion of Bypass Graft; Surgeon: Donald Mould, MD; Location: [place]; Service: Vascular; Laterality: Left;   .  Patch angioplasty  Left  09/05/2013     Procedure: Patch Angioplasty Distal Portion of Left Femoral-Below Knee Popliteal Bypass Graft; Surgeon: Donald Mould, MD; Location: [place]; Service: Vascular; Laterality: Left;   .  Intraoperative arteriogram  Left  09/05/2013     Procedure: INTRA OPERATIVE ARTERIOGRAM; Surgeon: Donald Mould,  MD; Location: [place]; Service: Vascular; Laterality: Left;   .  Fasciotomy  Left  09/05/2013     Procedure: FASCIOTOMY; Surgeon: Donald Mould, MD; Location: [place]; Service: Vascular; Laterality: Left;   Marland Kitchen  Vascular surgery     .  Fasciotomy closure  Left  09/11/2013       Procedure: CLOSURE OF FASCIOTOMY SITES LEFT LOWER EXTREMITY AND WOUND VAC APPLICATION; Surgeon: Donald Mould, MD; Location: [place]; Service: Vascular; Laterality: Left;      History    Social History   .  Marital Status:  Married     Spouse Name:  Donald [name]     Number of Children:  0   .  Years of Education:  12    Occupational History   .  Retired - [employer]      2004    Social History Main Topics   .  Smoking status:  Former Smoker -- 0.10 packs/day for 57 years     Types:  Cigarettes   .  Smokeless tobacco:  Never Used      Comment: pt states that he has only smoked 1/2 cig since 02/06/2013   .  Alcohol Use:  No   .  Drug Use:  No      Comment: 01/22/2013 "used marijuana in the 1960's"   .  Sexual Activity:  Not Currently    Other Topics  Concern   .  None     Social History Narrative    2 siblings, no known CAD. No children.    Patient is married to [name].    Patient has 12 grade education.    Patient is retired.    Patient is right-handed.     Family History   Problem  Relation  Age of Onset   .  Cancer - Other  Sister      Allergies   Allergen  Reactions   .  Other      Became hypotensive. Pt states allergic to "muscle relaxer" but unsure of name.   .  Tape  Hives and Itching     medfix non woven tape     Prior to Admission medications   Medication  Sig  Start Date  End Date  Taking?  Authorizing Provider   Ascorbic Acid (VITAMIN C) 1000 MG tablet  Take 500 mg by mouth daily.    Yes  Historical Provider, MD   cephALEXin (KEFLEX) 500 MG capsule  Take 1 capsule (500 mg total) by mouth 4 (four) times daily.  09/24/13   Yes  Donald Anchors Love, PA-C   dipyridamole-aspirin (AGGRENOX)  200-25 MG per 12 hr capsule  Take  1 capsule by mouth 2 (two) times daily.    Yes  Historical Provider, MD   docusate sodium (COLACE) 100 MG capsule  Take 200 mg by mouth 2 (two) times daily as needed for mild constipation.    Yes  Historical Provider, MD   ferrous fumarate (HEMOCYTE - 106 MG FE) 325 (106 FE) MG TABS tablet  Take 1 tablet (106 mg of iron total) by mouth 2 (two) times daily.  09/24/13   Yes  Donald Anchors Love, PA-C   gabapentin (NEURONTIN) 300 MG capsule  Take 1 capsule (300 mg total) by mouth 3 (three) times daily.  05/15/13   Yes  Dennie Bible, NP   insulin detemir (LEVEMIR) 100 UNIT/ML injection  Inject 10 Units into the skin daily.    Yes  Historical Provider, MD   lisinopril (PRINIVIL,ZESTRIL) 5 MG tablet  Take 2.5 mg by mouth daily.    Yes  Historical Provider, MD   metFORMIN (GLUCOPHAGE) 500 MG tablet  Take 500 mg by mouth 2 (two) times daily with a meal.    Yes  Historical Provider, MD   naproxen sodium (ANAPROX) 220 MG tablet  Take 220 mg by mouth daily as needed (pain).    Yes  Historical Provider, MD   omega-3 acid ethyl esters (LOVAZA) 1 G capsule  Take 2 g by mouth 2 (two) times daily.    Yes  Historical Provider, MD   omeprazole (PRILOSEC) 40 MG capsule  Take 40 mg by mouth daily.    Yes  Historical Provider, MD   oxyCODONE (OXY IR/ROXICODONE) 5 MG immediate release tablet  Take 1-2 tablets (5-10 mg total) by mouth every 6 (six) hours as needed for severe pain.  09/24/13   Yes  Donald Anchors Love, PA-C   OxyCODONE (OXYCONTIN) 20 mg T12A 12 hr tablet  Take 1 tablet (20 mg total) by mouth every 12 (twelve) hours. Long acting pain medication.  09/24/13   Yes  Donald Anchors Love, PA-C   oxyCODONE-acetaminophen (PERCOCET/ROXICET) 5-325 MG per tablet  Take 1 tablet by mouth every 4 (four) hours as needed for moderate pain or severe pain.    Yes  Historical Provider, MD   saccharomyces boulardii (FLORASTOR) 250 MG capsule  Take 1 capsule (250 mg total) by mouth 2 (two) times daily. probiotic   09/24/13   Yes  Donald Anchors Love, PA-C   senna-docusate (SENOKOT-S) 8.6-50 MG per tablet  Take 2 tablets by mouth 2 (two) times daily. For constipation.  09/24/13   Yes  Donald Anchors Love, PA-C   traMADol (ULTRAM) 50 MG tablet  Take 1 tablet (50 mg total) by mouth every 6 (six) hours as needed for moderate pain.  09/24/13   Yes  Donald Anchors Love, PA-C   traZODone (DESYREL) 50 MG tablet  Take 0.5-1 tablets (25-50 mg total) by mouth at bedtime as needed for sleep.  09/24/13   Yes  Donald Anchors Love, PA-C   Positive ROS:  All other systems have been reviewed and were otherwise negative with the exception of those mentioned in the HPI and as above.  Physical Exam:  Filed Vitals:    10/07/13 1843   BP:    Pulse:    Temp:    Resp:  19     General: Alert, no acute distress  HEENT: Normal for age  Cardiovascular: Regular rate and rhythm. Carotid pulses 2+, no bruits audible-Port-A-Cath in right upper anterior chest wall  Respiratory: Clear to auscultation. No cyanosis,  no use of accessory musculature  GI: No organomegaly, abdomen is soft and non-tender  Skin: No lesions in the area of chief complaint  Neurologic: Sensation intact distally  Psychiatric: Patient is competent for consent with normal mood and affect  Musculoskeletal: No obvious deformities  Extremities: 3+ pulse in left axillofemoral graft and left to right femoral-femoral grafts and right femoral artery. Right leg is well perfused with no pain or numbness in good motion and sensation.  Left leg is severely ischemic from the knee distally with no motion in the foot and no sensation. He does have sensation from the knee proximally and a 3+ pulse in the left femoral region    Assessment/Plan:  Severe ischemia left leg with recurrent thrombosis left femoral popliteal bypass-most recently treated with thrombectomy and revision 4 weeks ago.  The patient refuses chronic Coumadin therapy  Patient refuses removal of suspected left axillofemoral bypass    Discuss the situation with Dr. Oneida [name] and Dr. Doren [name] and we all agreed that only solution is left above knee amputation. I've discussed this with the patient and his wife.  I am concerned that this is going to be a high AKA given the level of demarcation.  I discussed this extensively with the patient and wife and they want to proceed.  They understand that a proximal revision is possible.   Eldridge Abrahams, M.D. [facility] Office: [phone] Pager:  [phone]

## 2013-10-08 NOTE — Transfer of Care (Signed)
Immediate Anesthesia Transfer of Care Note  Patient: Donald [name]  Procedure(s) Performed: Procedure(s): LEFT ABOVE KNEE AMPUTATION (Left)  Patient Location: PACU  Anesthesia Type:General  Level of Consciousness: awake, alert , oriented and sedated  Airway & Oxygen Therapy: Patient Spontanous Breathing and Patient connected to nasal cannula oxygen  Post-op Assessment: Report given to PACU RN, Post -op Vital signs reviewed and stable and Patient moving all extremities  Post vital signs: Reviewed and stable  Complications: No apparent anesthesia complications

## 2013-10-08 NOTE — Progress Notes (Signed)
Agree with dietetic intern assessment. Recommend Glucerna Shake BID and daily multivitamin with minerals if poor PO intake and weight loss continue after diet is advanced. RD to continue to monitor. Pryor Ochoa RD, LDN Pager: [phone]

## 2013-10-08 NOTE — H&P (View-Only) (Signed)
Vascular Surgery Consultation  Reason for Consult: Severely ischemic left leg  HPI: Donald [name] is a 68 y.o. male who presents for evaluation of severely ischemic left leg. Sponge is well known to our practice having previously undergone left axillobifemoral bypass grafting by Dr. Juanda Crumble feels and left femoral popliteal bypass grafting initially was saphenous vein and then replaced with Gore-Tex in September of 2014. 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 Oneida Alar. He also has refused Coumadin in the past. He occluded his left femoral-popliteal graft may 9,2015 and had thrombectomy of the graft through the left popliteal route by Dr. Doren Custard with a patch angioplasty. The patient required 4 compartment fasciotomy in the back wound coverag   He was seen by Dr. Doren Custard in the office 6 days ago with graft functioning. Last night at midnight-18 hours ago the patient developed pain and numbness in the left foot. He came to the emergency department late this afternoon with known occlusion of his left femoral-popliteal bypass with pain and numbness. Past Medical History  Diagnosis Date  . GERD (gastroesophageal reflux disease)   . Hyperlipidemia   . Hypertension   . Peripheral vascular disease   . Internal and external bleeding hemorrhoids   . History of gout     left great toe  . Depression   . Numbness in right leg     Hx: of diabetes  . Coronary artery disease     RCA occlusion with good collaterals, o/w no obs CAD 12/2011  . Pneumonia ~ 1977  . Type II diabetes mellitus   . History of blood transfusion 12/2011; 12/2012    "before OR; after OR" (01/22/2013)  . Stroke 2008    "left side weak; unable to move left hand still" (01/22/2013)  . Rectal cancer dx'd 08/2007    S/P chemo, radiation, biopsies  . 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 Carotid endarterectomy Right 2008  . Liver cryoablation  08/2010    "chemo shrunk the cancer 50% then they went in and burned the rest of it"  . Axillary-femoral bypass graft  01/29/2012    Procedure: BYPASS GRAFT AXILLA-BIFEMORAL;  Surgeon: Elam Dutch, MD;  Location: [facility] OR;  Service: Vascular;  Laterality: N/A;  Left axilla-bifemoral bypass using gore-tex graft.   . Colonoscopy w/ biopsies and polypectomy      Hx: of  . Femoral-popliteal bypass graft Left 01/06/2013    Procedure: Femoral-Popliteal Artery Bypass;  Surgeon: Elam Dutch, MD;  Location: [place];  Service: Vascular;  Laterality: Left;  . Intraoperative arteriogram Left 01/06/2013    Procedure: INTRA OPERATIVE ARTERIOGRAM;  Surgeon: Elam Dutch, MD;  Location: [place];  Service: Vascular;  Laterality: Left;  . Femoral-popliteal bypass graft Left 01/10/2013    Procedure: THROMBECTOMY POSSIBLE BYPASS GRAFT FEMORAL-POPLITEAL ARTERY;  Surgeon: Mal Misty, MD;  Location: [place] [facility];  Service: Vascular;  Laterality: Left;  Attempted Thrombectomy of left Femoral/Popliteal graft - Unsuccessful.  Insertion of Left femoral to below the knee popliteal propaten graft.  . Mass biopsy  2009    "found mass that was cancer; later had chemo and radiation; never cut mass out" (01/22/2013)  . Femoral-popliteal bypass graft Left 09/05/2013    Procedure: Thrombectomy of Left Femoral-Below Knee Popliteal Bypass Graft; Patch Angioplasty of Distal Portion of Bypass Graft;  Surgeon: Harrell Gave  Nicole Cella, MD;  Location: [facility];  Service: Vascular;  Laterality: Left;  . Patch angioplasty Left 09/05/2013    Procedure: Patch Angioplasty Distal Portion of Left Femoral-Below Knee Popliteal Bypass Graft;  Surgeon: Angelia Mould, MD;  Location: [facility];  Service: Vascular;  Laterality: Left;  . Intraoperative arteriogram Left 09/05/2013    Procedure: INTRA OPERATIVE ARTERIOGRAM;  Surgeon: Angelia Mould, MD;  Location: [facility];  Service: Vascular;   Laterality: Left;  . Fasciotomy Left 09/05/2013    Procedure: FASCIOTOMY;  Surgeon: Angelia Mould, MD;  Location: [facility];  Service: Vascular;  Laterality: Left;  Marland Kitchen Vascular surgery    . Fasciotomy closure Left 09/11/2013    Procedure: CLOSURE OF FASCIOTOMY SITES LEFT LOWER EXTREMITY AND WOUND VAC APPLICATION;  Surgeon: Angelia Mould, MD;  Location: [facility];  Service: Vascular;  Laterality: Left;   History   Social History  . Marital Status: Married    Spouse Name: Bethena Roys    Number of Children: 0  . Years of Education: 12   Occupational History  . Retired - [place]     2004   Social History Main Topics  . Smoking status: Former Smoker -- 0.10 packs/day for 57 years    Types: Cigarettes  . Smokeless tobacco: Never Used     Comment: pt states that he has only smoked 1/2 cig since 02/06/2013  . Alcohol Use: No  . Drug Use: No     Comment: 01/22/2013 "used marijuana in the 1960's"  . Sexual Activity: Not Currently   Other Topics Concern  . None   Social History Narrative   2 siblings, no known CAD. No children.   Patient is married to [place].    Patient has 12 grade education.    Patient is retired.    Patient is right-handed.    Family History  Problem Relation Age of Onset  . Cancer - Other Sister    Allergies  Allergen Reactions  . Other     Became hypotensive. Pt states allergic to "muscle relaxer" but unsure of name.  . Tape Hives and Itching    medfix non woven tape   Prior to Admission medications   Medication Sig Start Date End Date Taking? Authorizing Provider  Ascorbic Acid (VITAMIN C) 1000 MG tablet Take 500 mg by mouth daily.    Yes Historical Provider, MD  cephALEXin (KEFLEX) 500 MG capsule Take 1 capsule (500 mg total) by mouth 4 (four) times daily. 09/24/13  Yes Ivan Anchors Love, PA-C  dipyridamole-aspirin (AGGRENOX) 200-25 MG per 12 hr capsule Take 1 capsule by mouth 2 (two) times daily.   Yes Historical Provider, MD  docusate sodium (COLACE) 100  MG capsule Take 200 mg by mouth 2 (two) times daily as needed for mild constipation.   Yes Historical Provider, MD  ferrous fumarate (HEMOCYTE - 106 MG FE) 325 (106 FE) MG TABS tablet Take 1 tablet (106 mg of iron total) by mouth 2 (two) times daily. 09/24/13  Yes Ivan Anchors Love, PA-C  gabapentin (NEURONTIN) 300 MG capsule Take 1 capsule (300 mg total) by mouth 3 (three) times daily. 05/15/13  Yes Dennie Bible, NP  insulin detemir (LEVEMIR) 100 UNIT/ML injection Inject 10 Units into the skin daily.   Yes Historical Provider, MD  lisinopril (PRINIVIL,ZESTRIL) 5 MG tablet Take 2.5 mg by mouth daily.   Yes Historical Provider, MD  metFORMIN (GLUCOPHAGE) 500 MG tablet Take 500 mg by mouth 2 (two) times daily with  a meal.   Yes Historical Provider, MD  naproxen sodium (ANAPROX) 220 MG tablet Take 220 mg by mouth daily as needed (pain).   Yes Historical Provider, MD  omega-3 acid ethyl esters (LOVAZA) 1 G capsule Take 2 g by mouth 2 (two) times daily.    Yes Historical Provider, MD  omeprazole (PRILOSEC) 40 MG capsule Take 40 mg by mouth daily.   Yes Historical Provider, MD  oxyCODONE (OXY IR/ROXICODONE) 5 MG immediate release tablet Take 1-2 tablets (5-10 mg total) by mouth every 6 (six) hours as needed for severe pain. 09/24/13  Yes Ivan Anchors Love, PA-C  OxyCODONE (OXYCONTIN) 20 mg T12A 12 hr tablet Take 1 tablet (20 mg total) by mouth every 12 (twelve) hours. Long acting pain medication. 09/24/13  Yes Ivan Anchors Love, PA-C  oxyCODONE-acetaminophen (PERCOCET/ROXICET) 5-325 MG per tablet Take 1 tablet by mouth every 4 (four) hours as needed for moderate pain or severe pain.   Yes Historical Provider, MD  saccharomyces boulardii (FLORASTOR) 250 MG capsule Take 1 capsule (250 mg total) by mouth 2 (two) times daily. probiotic 09/24/13  Yes Ivan Anchors Love, PA-C  senna-docusate (SENOKOT-S) 8.6-50 MG per tablet Take 2 tablets by mouth 2 (two) times daily. For constipation. 09/24/13  Yes Ivan Anchors Love, PA-C  traMADol  (ULTRAM) 50 MG tablet Take 1 tablet (50 mg total) by mouth every 6 (six) hours as needed for moderate pain. 09/24/13  Yes Ivan Anchors Love, PA-C  traZODone (DESYREL) 50 MG tablet Take 0.5-1 tablets (25-50 mg total) by mouth at bedtime as needed for sleep. 09/24/13  Yes Ivan Anchors Love, PA-C     Positive ROS:   All other systems have been reviewed and were otherwise negative with the exception of those mentioned in the HPI and as above.  Physical Exam: Filed Vitals:   10/07/13 1843  BP:   Pulse:   Temp:   Resp: 19    General: Alert, no acute distress HEENT: Normal for age Cardiovascular: Regular rate and rhythm. Carotid pulses 2+, no bruits audible-Port-A-Cath in right upper anterior chest wall  Respiratory: Clear to auscultation. No cyanosis, no use of accessory musculature GI: No organomegaly, abdomen is soft and non-tender Skin: No lesions in the area of chief complaint Neurologic: Sensation intact distally Psychiatric: Patient is competent for consent with normal mood and affect Musculoskeletal: No obvious deformities Extremities: 3+ pulse in left axillofemoral graft and left to right femoral-femoral grafts and right femoral artery. Right leg is well perfused with no pain or numbness in good motion and sensation. Left leg is severely ischemic from the knee distally with no motion in the foot and no sensation. He does have sensation from the knee proximally and a 3+ pulse in the left femoral region      Assessment/Plan:  Severe ischemia left leg with recurrent thrombosis left femoral popliteal bypass-most recently treated with thrombectomy and revision 4 weeks ago. The patient refuses chronic Coumadin therapy Patient refuses removal of suspected left axillofemoral bypass  Discuss the situation with Dr. Oneida Alar and Dr. Doren Custard and we all agreed that only solution is left above knee amputation. I've discussed this with the patient and his wife. He is not ready to sign the consent form  yet but states that he may well be ready to sign that in the morning to have this performed on Thursday. We'll keep patient n.p.o. after midnight with expectation of left above-knee amputation tomorrow by Dr. Doren Custard or Trula Slade or Bridgett Larsson depending on availability  Tinnie Gens, MD 10/07/2013 7:48 PM

## 2013-10-08 NOTE — Anesthesia Postprocedure Evaluation (Signed)
  Anesthesia Post-op Note  Patient: Donald [name]  Procedure(s) Performed: Procedure(s): LEFT ABOVE KNEE AMPUTATION (Left)  Patient Location: PACU  Anesthesia Type:General  Level of Consciousness: awake  Airway and Oxygen Therapy: Patient Spontanous Breathing  Post-op Pain: mild  Post-op Assessment: Post-op Vital signs reviewed  Post-op Vital Signs: Reviewed  Last Vitals:  Filed Vitals:   10/08/13 1346  BP: 132/70  Pulse: 85  Temp: 36.6 C  Resp: 16    Complications: No apparent anesthesia complications

## 2013-10-09 DIAGNOSIS — M79609 Pain in unspecified limb: Secondary | ICD-10-CM

## 2013-10-09 DIAGNOSIS — I739 Peripheral vascular disease, unspecified: Secondary | ICD-10-CM

## 2013-10-09 DIAGNOSIS — E43 Unspecified severe protein-calorie malnutrition: Secondary | ICD-10-CM | POA: Insufficient documentation

## 2013-10-09 DIAGNOSIS — S78119A Complete traumatic amputation at level between unspecified hip and knee, initial encounter: Secondary | ICD-10-CM

## 2013-10-09 DIAGNOSIS — L98499 Non-pressure chronic ulcer of skin of other sites with unspecified severity: Secondary | ICD-10-CM

## 2013-10-09 LAB — GLUCOSE, CAPILLARY
GLUCOSE-CAPILLARY: 118 mg/dL — AB (ref 70–99)
GLUCOSE-CAPILLARY: 98 mg/dL (ref 70–99)
GLUCOSE-CAPILLARY: 103 mg/dL — AB (ref 70–99)
Glucose-Capillary: 110 mg/dL — ABNORMAL HIGH (ref 70–99)
Glucose-Capillary: 120 mg/dL — ABNORMAL HIGH (ref 70–99)
Glucose-Capillary: 137 mg/dL — ABNORMAL HIGH (ref 70–99)

## 2013-10-09 LAB — BASIC METABOLIC PANEL
BUN: 7 mg/dL (ref 6–23)
CO2: 22 meq/L (ref 19–32)
Calcium: 8.9 mg/dL (ref 8.4–10.5)
Chloride: 96 mEq/L (ref 96–112)
Creatinine, Ser: 0.77 mg/dL (ref 0.50–1.35)
GFR calc Af Amer: >90 mL/min (ref >90)
GFR calc non Af Amer: >90 mL/min (ref >90)
GLUCOSE: 109 mg/dL — AB (ref 70–99)
POTASSIUM: 4.1 meq/L (ref 3.7–5.3)
SODIUM: 136 meq/L — AB (ref 137–147)

## 2013-10-09 LAB — CBC
HEMATOCRIT: 30.2 % — AB (ref 39.0–52.0)
HEMOGLOBIN: 9.1 g/dL — AB (ref 13.0–17.0)
MCH: 25.1 pg — AB (ref 26.0–34.0)
MCHC: 30.1 g/dL (ref 30.0–36.0)
MCV: 83.2 fL (ref 78.0–100.0)
Platelets: 285 10*3/uL (ref 150–400)
RBC: 3.63 MIL/uL — AB (ref 4.22–5.81)
RDW: 23.1 % — ABNORMAL HIGH (ref 11.5–15.5)
WBC: 9.6 10*3/uL (ref 4.0–10.5)

## 2013-10-09 MED ORDER — DIPHENHYDRAMINE HCL 12.5 MG/5ML PO ELIX
12.5000 mg | ORAL_SOLUTION | Freq: Four times a day (QID) | ORAL | Status: DC | PRN
  Administered 2013-10-11: 12.5 mg via ORAL
  Filled 2013-10-09: qty 10

## 2013-10-09 MED ORDER — MORPHINE SULFATE 2 MG/ML IJ SOLN
2.0000 mg | INTRAMUSCULAR | Status: DC | PRN
  Administered 2013-10-09 (×2): 2 mg via INTRAVENOUS
  Filled 2013-10-09 (×2): qty 1

## 2013-10-09 MED ORDER — GLUCERNA SHAKE PO LIQD: 237.0000 mL | Freq: Every day | ORAL | Status: DC

## 2013-10-09 MED ORDER — LISINOPRIL 2.5 MG PO TABS
2.5000 mg | ORAL_TABLET | Freq: Every day | ORAL | Status: DC
  Administered 2013-10-10 – 2013-10-13 (×4): 2.5 mg via ORAL
  Filled 2013-10-09 (×4): qty 1

## 2013-10-09 MED ORDER — NALOXONE HCL 0.4 MG/ML IJ SOLN: 0.4000 mg | INTRAMUSCULAR | Status: DC | PRN

## 2013-10-09 MED ORDER — DIPHENHYDRAMINE HCL 50 MG/ML IJ SOLN: 12.5000 mg | Freq: Four times a day (QID) | INTRAMUSCULAR | Status: DC | PRN

## 2013-10-09 MED ORDER — MORPHINE SULFATE (PF) 1 MG/ML IV SOLN
INTRAVENOUS | Status: DC
  Administered 2013-10-09: 13.9 mg via INTRAVENOUS
  Administered 2013-10-09 (×2): via INTRAVENOUS
  Administered 2013-10-09: 11.93 mg via INTRAVENOUS
  Administered 2013-10-09: 19.84 mg via INTRAVENOUS
  Administered 2013-10-09: 23:00:00 via INTRAVENOUS
  Administered 2013-10-09: 9.34 mg via INTRAVENOUS
  Administered 2013-10-10: 8 mg via INTRAVENOUS
  Administered 2013-10-10: 25.4 mg via INTRAVENOUS
  Administered 2013-10-10: 11:00:00 via INTRAVENOUS
  Administered 2013-10-10: 9 mg via INTRAVENOUS
  Administered 2013-10-10: 15:00:00 via INTRAVENOUS
  Administered 2013-10-10: 15 mg via INTRAVENOUS
  Administered 2013-10-10: 03:00:00 via INTRAVENOUS
  Administered 2013-10-10: 23.52 mg via INTRAVENOUS
  Administered 2013-10-11: 3 mg via INTRAVENOUS
  Administered 2013-10-11: 25.41 mg via INTRAVENOUS
  Administered 2013-10-11: 7.5 mg via INTRAVENOUS
  Administered 2013-10-11: 2.93 mg via INTRAVENOUS
  Administered 2013-10-11: 13.5 mg via INTRAVENOUS
  Administered 2013-10-11: 12 mg via INTRAVENOUS
  Administered 2013-10-12: 12:00:00 via INTRAVENOUS
  Administered 2013-10-12: 4.5 mg via INTRAVENOUS
  Administered 2013-10-12: 7.5 mg via INTRAVENOUS
  Administered 2013-10-12: 10.5 mg via INTRAVENOUS
  Administered 2013-10-12: 2 mg via INTRAVENOUS
  Administered 2013-10-13: 4.5 mg via INTRAVENOUS
  Administered 2013-10-13: 1.5 mg via INTRAVENOUS
  Filled 2013-10-09 (×12): qty 25

## 2013-10-09 MED ORDER — SODIUM CHLORIDE 0.9 % IJ SOLN: 9.0000 mL | INTRAMUSCULAR | Status: DC | PRN

## 2013-10-09 MED ORDER — KETOROLAC TROMETHAMINE 30 MG/ML IJ SOLN
30.0000 mg | Freq: Four times a day (QID) | INTRAMUSCULAR | Status: DC
  Administered 2013-10-09 – 2013-10-10 (×4): 30 mg via INTRAVENOUS
  Filled 2013-10-09 (×7): qty 1

## 2013-10-09 MED ORDER — ONDANSETRON HCL 4 MG/2ML IJ SOLN: 4.0000 mg | Freq: Four times a day (QID) | INTRAMUSCULAR | Status: DC | PRN

## 2013-10-09 MED ORDER — ADULT MULTIVITAMIN W/MINERALS CH
1.0000 | ORAL_TABLET | Freq: Every day | ORAL | Status: DC
  Administered 2013-10-09 – 2013-10-13 (×5): 1 via ORAL
  Filled 2013-10-09 (×5): qty 1

## 2013-10-09 NOTE — Progress Notes (Signed)
PT Cancellation Note  Patient Details Name: KOLSTON LACOUNT MRN: [id number] DOB: Aug 04, 1945   Cancelled Treatment:    Reason Eval/Treat Not Completed: Other (comment) Attempted PT eval, however pt politely declined OOB activity  Will reattempt PT eval tomorrow;   Thank you,  Roney Marion, PT  Acute Rehabilitation Services Pager [phone] Office [phone]'   [place] 10/09/2013, 3:08 PM

## 2013-10-09 NOTE — Progress Notes (Signed)
Agree with dietetic intern note. Pryor Ochoa RD, LDN Inpatient Clinical Dietitian Pager: [phone] After Hours Pager: [phone]

## 2013-10-09 NOTE — Progress Notes (Signed)
NUTRITION FOLLOW UP  Intervention:    Glucerna Shake daily, providing 220 kcals and 10 grams of protein   Daily multivitamin   RD to continue to follow   Nutrition Dx:   Inadequate oral intake related to poor appetite as evidenced by weight loss of 8.8% x 1 month; ongoing   Goal:   Pt to meet >/=90% of estimated nutrition needs; unmet  Monitor:   PO intake, supplement acceptance, weight trend, labs   Assessment:   Patient is a 68 y.o. male who was recently admitted for ischemic limb and had undergone left sided thrombectomy and left femoral popliteal bypass graft with patch angioplasty and fasciotomy with wound VAC was eventually discharged to rehabilitation and patient has been at home last 10 days started experiencing worsening pain of his left lower is to be since morning which was cold.   Pt is s/p the following on 6/11:  AMPUTATION ABOVE KNEE  Pt reports that he was unable to eat breakfast due to the amount of pain that he is in. Pt refuses to try and snacks or oral supplements currently. RD to continue to follow and add supplements and snacks. Will order Glucerna once daily to assess if pt will accept.   Encouraged pt and family to eat even a few bites, as he needs nutrition to heal.   Height: Ht Readings from Last 1 Encounters:  10/07/13 5\' 9"  (5.053 m)    Weight Status:   Wt Readings from Last 1 Encounters:  10/07/13 166 lb (75.297 kg)    Re-estimated needs:  Kcal: 1800 - 2000  Protein: 80 - 90 grams  Fluid: >/= 1.9 L/day   Skin: stage II pressure ulcer mid coccyx, open wound left groin   Diet Order: Carb Control   Intake/Output Summary (Last 24 hours) at 10/09/13 1215 Last data filed at 10/09/13 1036  Gross per 24 hour  Intake 983.33 ml  Output   1000 ml  Net -16.67 ml    Last BM: 6/11    Labs:   Recent Labs Lab 10/07/13 1748 10/08/13 0430 10/09/13 0500  NA 137 139 136*  K 4.6 4.4 4.1  CL 99 101 96  CO2 22 22 22   BUN 11 9 7    CREATININE 0.94 0.82 0.77  CALCIUM 9.4 9.2 8.9  GLUCOSE 116* 118* 109*    CBG (last 3)   Recent Labs  10/08/13 2345 10/09/13 0451 10/09/13 0752  GLUCAP 137* 120* 118*    Scheduled Meds: . dipyridamole-aspirin  1 capsule Oral BID  . docusate sodium  100 mg Oral BID  . enoxaparin (LOVENOX) injection  40 mg Subcutaneous Q24H  . insulin aspart  0-9 Units Subcutaneous TID WC  . insulin glargine  5 Units Subcutaneous QHS  . OxyCODONE  20 mg Oral Q12H  . pantoprazole  40 mg Oral Daily  . senna  2 tablet Oral QHS  . traZODone  25 mg Oral QHS    Continuous Infusions: . lactated ringers 50 mL/hr at 10/08/13 [place], BS Dietetic Intern Pager: [phone]

## 2013-10-09 NOTE — Consult Note (Signed)
Physical Medicine and Rehabilitation Consult Reason for Consult: Left AKA Referring Physician: [facility]   HPI: Donald [name] is a 68 y.o. right-handed male with history of CVA with left-sided weakness, hypertension, peripheral vascular disease with multiple revascularization procedures received CIR 2014 after revascularization procedure as well his recent thrombectomy of occluded femoropopliteal bypass fasciotomy requiring VAC May of 2015 and again received inpatient rehabilitation services at that time.. Patient has been using a cane at home since recent discharge from rehabilitation services with assistance of his wife and receiving home therapies. Presented 10/07/2013 a severely ischemic left lower extremity with increasing pain. Limb was felt to be non-salvageable. 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 Trula Slade. Postoperative pain management. Subcutaneous Lovenox added for DVT prophylaxis. Patient remains on Aggrenox for history of CVA. Physical and occupational therapy evaluations pending. M.D. has requested physical medicine rehabilitation consult.  Patient feels very gown today. He states that this has been tougher than he thought. Severe postoperative pain. Review of Systems  Gastrointestinal: Positive for constipation.       GERD  Neurological:       Left side weakness after CVA  Psychiatric/Behavioral: Positive for depression.  All other systems reviewed and are negative.  Past Medical History  Diagnosis Date  . GERD (gastroesophageal reflux disease)   . Hyperlipidemia   . Hypertension   . Peripheral vascular disease   . Internal and external bleeding hemorrhoids   . History of gout     left great toe  . Depression   . Numbness in right leg     Hx: of diabetes  . Coronary artery disease     RCA occlusion with good collaterals, o/w no obs CAD 12/2011  . Pneumonia ~ 1977  . Type II diabetes mellitus   . History of blood transfusion 12/2011; 12/2012    "before OR;  after OR" (01/22/2013)  . Stroke 2008    "left side weak; unable to move left hand still" (01/22/2013)  . Rectal cancer dx'd 08/2007    S/P chemo, radiation, biopsies  . Metastases to the liver dx'd 08/2010   Past Surgical History  Procedure Laterality Date  . Carotid endarterectomy Right 2008  . Liver cryoablation  08/2010    "chemo shrunk the cancer 50% then they went in and burned the rest of it"  . Axillary-femoral bypass graft  01/29/2012    Procedure: BYPASS GRAFT AXILLA-BIFEMORAL;  Surgeon: Elam Dutch, MD;  Location: [facility] OR;  Service: Vascular;  Laterality: N/A;  Left axilla-bifemoral bypass using gore-tex graft.   . Colonoscopy w/ biopsies and polypectomy      Hx: of  . Femoral-popliteal bypass graft Left 01/06/2013    Procedure: Femoral-Popliteal Artery Bypass;  Surgeon: Elam Dutch, MD;  Location: [place];  Service: Vascular;  Laterality: Left;  . Intraoperative arteriogram Left 01/06/2013    Procedure: INTRA OPERATIVE ARTERIOGRAM;  Surgeon: Elam Dutch, MD;  Location: [place];  Service: Vascular;  Laterality: Left;  . Femoral-popliteal bypass graft Left 01/10/2013    Procedure: THROMBECTOMY POSSIBLE BYPASS GRAFT FEMORAL-POPLITEAL ARTERY;  Surgeon: Mal Misty, MD;  Location: [place];  Service: Vascular;  Laterality: Left;  Attempted Thrombectomy of left Femoral/Popliteal graft - Unsuccessful.  Insertion of Left femoral to below the knee popliteal propaten graft.  . Mass biopsy  2009    "found mass that was cancer; later had chemo and radiation; never cut mass out" (01/22/2013)  . Femoral-popliteal bypass graft Left 09/05/2013  Procedure: Thrombectomy of Left Femoral-Below Knee Popliteal Bypass Graft; Patch Angioplasty of Distal Portion of Bypass Graft;  Surgeon: Angelia Mould, MD;  Location: [place];  Service: Vascular;  Laterality: Left;  . Patch angioplasty Left 09/05/2013    Procedure: Patch Angioplasty Distal Portion of Left Femoral-Below Knee Popliteal Bypass Graft;   Surgeon: Angelia Mould, MD;  Location: [place];  Service: Vascular;  Laterality: Left;  . Intraoperative arteriogram Left 09/05/2013    Procedure: INTRA OPERATIVE ARTERIOGRAM;  Surgeon: Angelia Mould, MD;  Location: [place];  Service: Vascular;  Laterality: Left;  . Fasciotomy Left 09/05/2013    Procedure: FASCIOTOMY;  Surgeon: Angelia Mould, MD;  Location: [place];  Service: Vascular;  Laterality: Left;  Marland Kitchen Vascular surgery    . Fasciotomy closure Left 09/11/2013    Procedure: CLOSURE OF FASCIOTOMY SITES LEFT LOWER EXTREMITY AND WOUND VAC APPLICATION;  Surgeon: Angelia Mould, MD;  Location: [facility] OR;  Service: Vascular;  Laterality: Left;   Family History  Problem Relation Age of Onset  . Cancer - Other Sister    Social History:  reports that he has quit smoking. His smoking use included Cigarettes. He has a 5.7 pack-year smoking history. He has never used smokeless tobacco. He reports that he does not drink alcohol or use illicit drugs. Allergies:  Allergies  Allergen Reactions  . Other     Became hypotensive. Pt states allergic to "muscle relaxer" but unsure of name.  . Tape Hives and Itching    medfix non woven tape   Medications Prior to Admission  Medication Sig Dispense Refill  . Ascorbic Acid (VITAMIN C) 1000 MG tablet Take 500 mg by mouth daily.       . cephALEXin (KEFLEX) 500 MG capsule Take 1 capsule (500 mg total) by mouth 4 (four) times daily.  120 capsule  0  . dipyridamole-aspirin (AGGRENOX) 200-25 MG per 12 hr capsule Take 1 capsule by mouth 2 (two) times daily.      Marland Kitchen docusate sodium (COLACE) 100 MG capsule Take 200 mg by mouth 2 (two) times daily as needed for mild constipation.      . ferrous fumarate (HEMOCYTE - 106 MG FE) 325 (106 FE) MG TABS tablet Take 1 tablet (106 mg of iron total) by mouth 2 (two) times daily.  60 each  1  . gabapentin (NEURONTIN) 300 MG capsule Take 1 capsule (300 mg total) by mouth 3 (three) times daily.  90 capsule  6  .  insulin detemir (LEVEMIR) 100 UNIT/ML injection Inject 10 Units into the skin daily.      Marland Kitchen lisinopril (PRINIVIL,ZESTRIL) 5 MG tablet Take 2.5 mg by mouth daily.      . metFORMIN (GLUCOPHAGE) 500 MG tablet Take 500 mg by mouth 2 (two) times daily with a meal.      . naproxen sodium (ANAPROX) 220 MG tablet Take 220 mg by mouth daily as needed (pain).      Marland Kitchen omega-3 acid ethyl esters (LOVAZA) 1 G capsule Take 2 g by mouth 2 (two) times daily.       Marland Kitchen omeprazole (PRILOSEC) 40 MG capsule Take 40 mg by mouth daily.      Marland Kitchen oxyCODONE (OXY IR/ROXICODONE) 5 MG immediate release tablet Take 1-2 tablets (5-10 mg total) by mouth every 6 (six) hours as needed for severe pain.  120 tablet  0  . OxyCODONE (OXYCONTIN) 20 mg T12A 12 hr tablet Take 1 tablet (20 mg total) by mouth every  12 (twelve) hours. Long acting pain medication.  60 tablet  0  . oxyCODONE-acetaminophen (PERCOCET/ROXICET) 5-325 MG per tablet Take 1 tablet by mouth every 4 (four) hours as needed for moderate pain or severe pain.      Marland Kitchen saccharomyces boulardii (FLORASTOR) 250 MG capsule Take 1 capsule (250 mg total) by mouth 2 (two) times daily. probiotic  60 capsule  1  . senna-docusate (SENOKOT-S) 8.6-50 MG per tablet Take 2 tablets by mouth 2 (two) times daily. For constipation.      . traMADol (ULTRAM) 50 MG tablet Take 1 tablet (50 mg total) by mouth every 6 (six) hours as needed for moderate pain.  45 tablet  0  . traZODone (DESYREL) 50 MG tablet Take 0.5-1 tablets (25-50 mg total) by mouth at bedtime as needed for sleep.  30 tablet  0    Home: Home Living Family/patient expects to be discharged to:: Private residence Living Arrangements: Spouse/significant other  Functional History:   Functional Status:  Mobility:          ADL:    Cognition: Cognition Orientation Level: Oriented X4    Blood pressure 164/53, pulse 81, temperature 97.4 F (36.3 C), temperature source Oral, resp. rate 20, height 5\' 9"  (1.753 m), weight 75.297  kg (166 lb), SpO2 97.00%. Physical Exam  Vitals reviewed. HENT:  Head: Normocephalic.  Eyes: EOM are normal.  Neck: Normal range of motion. Neck supple. No thyromegaly present.  Cardiovascular: Normal rate and regular rhythm.   Respiratory: Effort normal and breath sounds normal. No respiratory distress.  GI: Soft. Bowel sounds are normal. He exhibits no distension.  Neurological: He is alert.  Speech is a bit dysarthric but intelligible. He was oriented x3. He would easily become tearful during exam. Patient followed commands  Skin:  Amputation site is dressed and appropriately tender   upper extremity strength is 5/5 in the deltoid, bicep, tricep on the right and 4-/5 in the deltoid, bicep, tricep on the left Left hip flexor not tested secondary to pain Right lower extremity 4/5 and hip flexor knee extensor ankle dorsiflexor plantar flexor    Results for orders placed during the hospital encounter of 10/07/13 (from the past 24 hour(s))  GLUCOSE, CAPILLARY     Status: Abnormal   Collection Time    10/08/13  8:08 AM      Result Value Ref Range   Glucose-Capillary 115 (*) 70 - 99 mg/dL  GLUCOSE, CAPILLARY     Status: Abnormal   Collection Time    10/08/13 12:00 PM      Result Value Ref Range   Glucose-Capillary 134 (*) 70 - 99 mg/dL   Comment 1 Notify RN    GLUCOSE, CAPILLARY     Status: Abnormal   Collection Time    10/08/13  4:49 PM      Result Value Ref Range   Glucose-Capillary 142 (*) 70 - 99 mg/dL   Comment 1 Notify RN    GLUCOSE, CAPILLARY     Status: Abnormal   Collection Time    10/08/13  7:55 PM      Result Value Ref Range   Glucose-Capillary 145 (*) 70 - 99 mg/dL   Comment 1 Notify RN    GLUCOSE, CAPILLARY     Status: Abnormal   Collection Time    10/08/13 11:45 PM      Result Value Ref Range   Glucose-Capillary 137 (*) 70 - 99 mg/dL   Comment 1 Notify RN  GLUCOSE, CAPILLARY     Status: Abnormal   Collection Time    10/09/13  4:51 AM      Result Value  Ref Range   Glucose-Capillary 120 (*) 70 - 99 mg/dL   Comment 1 Notify RN     No results found.  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 Does the need for close, 24 hr/day medical supervision in concert with the patient's rehab needs make it unreasonable for this patient to be served in a less intensive setting? Potentially 2. Co-Morbidities requiring supervision/potential complications: Diabetes, hypertension, anemia, chronic mild left hemiparesis 3. Due to bladder management, bowel management, safety, skin/wound care, disease management, medication administration and pain management, does the patient require 24 hr/day rehab nursing? Potentially 4. Does the patient require coordinated care of a physician, rehab nurse, PT (1-2 hrs/day, 5 days/week) and OT (1-2 hrs/day, 5 days/week) to address physical and functional deficits in the context of the above medical diagnosis(es)? Potentially Addressing deficits in the following areas: balance, endurance, locomotion, strength, transferring, bowel/bladder control, bathing, dressing, feeding, grooming and toileting 5. Can the patient actively participate in an intensive therapy program of at least 3 hrs of therapy per day at least 5 days per week? No 6. The potential for patient to make measurable gains while on inpatient rehab is fair 7. Anticipated functional outcomes upon discharge from inpatient rehab are min assist  with PT, min assist with OT, n/a with SLP. 8. Estimated rehab length of stay to reach the above functional goals is: 10-14 days 9. Does the patient have adequate social supports to accommodate these discharge functional goals? Potentially 10. Anticipated D/C setting: Home 11. Anticipated post D/C treatments: [place] therapy 12. Overall Rehab/Functional Prognosis: fair  RECOMMENDATIONS: This patient's condition is appropriate for continued rehabilitative care in the following setting: Should be ready for CIR once he is able  to tolerate PT and OT from a pain management standpoint Patient has agreed to participate in recommended program. Potentially Note that insurance prior authorization may be required for reimbursement for recommended care.  Comment: Patient also emotionally distressed with poor adjustment thus far.    10/09/2013

## 2013-10-09 NOTE — Progress Notes (Signed)
PROGRESS NOTE  Donald [name] JGG:[id number] DOB: 1946/04/05 DOA: 10/07/2013 PCP: Maggie Font, MD  Assessment/Plan: Severe ischemia left leg with recurrent thrombosis left femoral popliteal bypass/ PVD  -10/08/13--L-AKA--Dr. Trula Slade  -continue aggrenox  -pt refuses warfarin  -pain control-->morphine PCA started 10/09/13 with improving pain relief  -cathartics  -CIR has been consulted Diabetes mellitus type 2  -09/06/2013 Hemoglobin of 7.7  -Half home dose of Levemir  -NovoLog sliding scale  -CBGs controlled Tobacco abuse  -Tobacco cessation discussed  -Declines nicotine patch  Hypertension  -Restart lisinopril  Chronic pain  -Restart OxyContin 20 mg every 12 hours  -OxyIR 5 mg every 4 hours when necessary pain  -morphine PCA as above Normocytic anemia  -Likely due to chronic disease  -Check iron studies in the morning  -Monitor CBC  Hx of rectal cancer  -oncologist is Dr. Alen Blew Situational Depression -continue to monitor -denies suicidal ideation Severe Protein Calorie Malnutrition -nutritional supplementation   Family Communication:   Wife at beside Disposition Plan:  CIR        Subjective: Patient feels sad today.  He denies any suicidal ideation he feels like he is a burden to the nursing staff and his family. He denies any fevers, chills, chest pain, shortness breath, nausea, vomiting, diarrhea, abdominal pain. No dysuria or hematuria.  Objective: Filed Vitals:   10/09/13 1316 10/09/13 1555 10/09/13 1731 10/09/13 1734  BP: 148/63     Pulse: 80     Temp: 97.6 F (36.4 C)     TempSrc: Oral     Resp: 18 16 14 14   Height:      Weight:      SpO2: 98% 96% 94% 95%    Intake/Output Summary (Last 24 hours) at 10/09/13 1800 Last data filed at 10/09/13 1400  Gross per 24 hour  Intake 1383.33 ml  Output    600 ml  Net 783.33 ml   Weight change:  Exam:   General:  Pt is alert, follows commands appropriately, not in acute  distress  HEENT: No icterus, No thrush,Purple [place]/AT  Cardiovascular: RRR, S1/S2, no rubs, no gallops  Respiratory: Diminished breath sounds but with auscultation.  Abdomen: Soft/+BS, non tender, non distended, no guarding  Extremities: No edema, No lymphangitis, No petechiae, No rashes, no synovitis; L-AKA without active bleed--bulky dressing  Data Reviewed: Basic Metabolic Panel:  Recent Labs Lab 10/07/13 1748 10/08/13 0430 10/09/13 0500  NA 137 139 136*  K 4.6 4.4 4.1  CL 99 101 96  CO2 22 22 22   GLUCOSE 116* 118* 109*  BUN 11 9 7   CREATININE 0.94 0.82 0.77  CALCIUM 9.4 9.2 8.9   Liver Function Tests:  Recent Labs Lab 10/08/13 0430  AST 16  ALT 8  ALKPHOS 82  BILITOT 0.4  PROT 6.9  ALBUMIN 3.1*   No results found for this basename: LIPASE, AMYLASE,  in the last 168 hours No results found for this basename: AMMONIA,  in the last 168 hours CBC:  Recent Labs Lab 10/07/13 1748 10/08/13 0430 10/09/13 0500  WBC 7.7 7.4 9.6  NEUTROABS 5.5 5.1  --   HGB 9.8* 9.9* 9.1*  HCT 32.3* 33.1* 30.2*  MCV 82.4 83.0 83.2  PLT 284 287 285   Cardiac Enzymes: No results found for this basename: CKTOTAL, CKMB, CKMBINDEX, TROPONINI,  in the last 168 hours BNP: No components found with this basename: POCBNP,  CBG:  Recent Labs Lab 10/08/13 2345 10/09/13 0451 10/09/13  2010 10/09/13 1211 10/09/13 1605  GLUCAP 137* 120* 118* 110* 98    Recent Results (from the past 240 hour(s))  SURGICAL PCR SCREEN     Status: None   Collection Time    10/08/13 12:12 AM      Result Value Ref Range Status   MRSA, PCR NEGATIVE  NEGATIVE Final   Staphylococcus aureus NEGATIVE  NEGATIVE Final   Comment:            The Xpert SA Assay (FDA     approved for NASAL specimens     in patients over 37 years of age),     is one component of     a comprehensive surveillance     program.  Test performance has     been validated by [employer] for patients greater     than or equal to 76  year old.     It is not intended     to diagnose infection nor to     guide or monitor treatment.     Scheduled Meds: . dipyridamole-aspirin  1 capsule Oral BID  . docusate sodium  100 mg Oral BID  . enoxaparin (LOVENOX) injection  40 mg Subcutaneous Q24H  . feeding supplement (GLUCERNA SHAKE)  237 mL Oral Daily  . insulin aspart  0-9 Units Subcutaneous TID WC  . insulin glargine  5 Units Subcutaneous QHS  . ketorolac  30 mg Intravenous 4 times per day  . morphine   Intravenous 6 times per day  . multivitamin with minerals  1 tablet Oral Daily  . OxyCODONE  20 mg Oral Q12H  . pantoprazole  40 mg Oral Daily  . senna  2 tablet Oral QHS  . traZODone  25 mg Oral QHS   Continuous Infusions: . lactated ringers 50 mL/hr at 10/08/13 2230     Kipper Buch, DO  [facility] Pager [phone]  If 7PM-7AM, please contact night-coverage [email or web address] Password [email or web address] 10/09/2013, 6:00 PM   LOS: 2 days

## 2013-10-09 NOTE — Progress Notes (Signed)
  [facility] Progress Note  10/09/2013 7:49 AM POD 1  Subjective:  Feels depressed and states he wishes he was dead.  Does not want to ask for pain meds b/c he feels like he is a burden on the nurses.     Filed Vitals:   10/09/13 0452  BP: 164/53  Pulse: 81  Temp: 97.4 F (36.3 C)  Resp: 20    Physical Exam: Incisions:  Stump is wrapped and dressing is clean   CBC    Component Value Date/Time   WBC 9.6 10/09/2013 0500   WBC 7.1 06/05/2013 1010   RBC 3.63* 10/09/2013 0500   RBC 4.23 06/05/2013 1010   RBC 3.54* 01/26/2012 0455   HGB 9.1* 10/09/2013 0500   HGB 9.3* 06/05/2013 1010   HCT 30.2* 10/09/2013 0500   HCT 30.8* 06/05/2013 1010   PLT 285 10/09/2013 0500   PLT 324 06/05/2013 1010   MCV 83.2 10/09/2013 0500   MCV 72.9* 06/05/2013 1010   MCH 25.1* 10/09/2013 0500   MCH 22.0* 06/05/2013 1010   MCHC 30.1 10/09/2013 0500   MCHC 30.2* 06/05/2013 1010   RDW 23.1* 10/09/2013 0500   RDW 16.9* 06/05/2013 1010   LYMPHSABS 1.5 10/08/2013 0430   LYMPHSABS 1.6 06/05/2013 1010   MONOABS 0.7 10/08/2013 0430   MONOABS 0.5 06/05/2013 1010   EOSABS 0.1 10/08/2013 0430   EOSABS 0.1 06/05/2013 1010   BASOSABS 0.0 10/08/2013 0430   BASOSABS 0.1 06/05/2013 1010    BMET    Component Value Date/Time   NA 136* 10/09/2013 0500   NA 137 06/05/2013 1010   NA 147* 10/12/2011 0959   K 4.1 10/09/2013 0500   K 5.4* 06/05/2013 1010   K 5.2* 10/12/2011 0959   CL 96 10/09/2013 0500   CL 102 05/19/2012 1427   CL 102 10/12/2011 0959   CO2 22 10/09/2013 0500   CO2 23 06/05/2013 1010   CO2 29 10/12/2011 0959   GLUCOSE 109* 10/09/2013 0500   GLUCOSE 239* 06/05/2013 1010   GLUCOSE 205* 05/19/2012 1427   GLUCOSE 244* 10/12/2011 0959   BUN 7 10/09/2013 0500   BUN 19.5 06/05/2013 1010   BUN 13 10/12/2011 0959   CREATININE 0.77 10/09/2013 0500   CREATININE 1.4* 06/05/2013 1010   CREATININE 1.0 10/12/2011 0959   CALCIUM 8.9 10/09/2013 0500   CALCIUM 10.1 06/05/2013 1010   CALCIUM 9.0 10/12/2011 0959   GFRNONAA >90 10/09/2013 0500     GFRAA >90 10/09/2013 0500    INR    Component Value Date/Time   INR 1.06 10/07/2013 1748     Intake/Output Summary (Last 24 hours) at 10/09/13 0749 Last data filed at 10/09/13 0600  Gross per 24 hour  Intake 2033.33 ml  Output    900 ml  Net 1133.33 ml     Assessment/Plan:  68 y.o. male is s/p left above knee amputation  POD 1  -pt doing well this am -his pain is not well controlled-possibly consider PCA -may consider psych consult -will take down dressing tomorrow   Leontine Locket, PA-C [facility] [phone] 10/09/2013 7:49 AM       I agree with the above Pain not well controlled. Will start PCA and toradol, monitor creatinine tomorrow  WElls Brabham

## 2013-10-09 NOTE — Progress Notes (Signed)
Received pre-screen request for inpatient rehab. Noted that rehab MD consult has already been ordered.   Follow up will be with Karene Fry, admission coordinator when rehab consult is completed. She can be reached at [phone].   Thanks.  Nanetta Batty, PT Rehabilitation Admissions Coordinator [phone]

## 2013-10-09 NOTE — Progress Notes (Addendum)
OT NOTE  Pt very upset and verbalizes depression over most recent surgery. Pt would prefer only to see male pastor to discuss grief. Pt is agreeable to peer on peer meeting with representative from amputee support group. Outpatient neuro support group contacted to help facilitate a meeting.   Charge RN notified that matching this patient to male staff members would be best whenever possible.  Pt is not opposed to male help but would like when possible to have male assistance for peri care due to feeling embarrassed with male assistance.   MD- pt could greatly benefit from mattress overlay bed to prevent pressure sores. Pt with decr mobility and currently declining repositioning due to fear of pain. Pt does not like to ask staff for medication because he feels like he is bothering staff when they are already busy. Pt is not pushing PCA button as much because he is fearful that his oxygen is getting too low when it peeps. Pt with very high anxiety with all mobility. In addition: pt verbalized 3 times during session "I wish I would just die" pt states this attempting to verbalize depression and grief over loss of LT LE. Pt does not express any intention to harm self at this time but could greatly benefit from support resources mentioned above.  Jeri Modena   OTR/L Pager: [phone] Office: [phone] .

## 2013-10-09 NOTE — Evaluation (Signed)
Occupational Therapy Evaluation Patient Details Name: Donald [name] MRN: [id number] DOB: [birth year] Today's Date: 10/09/2013    History of Present Illness 68 y.o. male s/p Lt aka.Pt recently d/c from [facility] in May 2015 for Lt femoral-popliteal bypass. 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   Clinical Impression   Patient is s/p LT AKA surgery resulting in functional limitations due to the deficits listed below (see OT problem list).  Patient will benefit from skilled OT acutely to increase independence and safety with ADLS to allow discharge CIR. OT to follow acutely for bed mobility, adl retraining, and balance.     Follow Up Recommendations  CIR    Equipment Recommendations  Other (comment) (defer to CIR)    Recommendations for Other Services Rehab consult     Precautions / Restrictions Precautions Precautions: Fall Precaution Comments: LT AKA wrap       Mobility Bed Mobility Overal bed mobility: Needs Assistance             General bed mobility comments: long sitting by pulling on bed rail with Rt UE.   Transfers                 General transfer comment: declined any eob or oob attempts    Balance Overall balance assessment: Needs assistance Sitting-balance support: Single extremity supported;Feet supported (long sit) Sitting balance-Leahy Scale: Fair                                      ADL Overall ADL's : Needs assistance/impaired     Grooming: Set up;Sitting       Lower Body Bathing: Total assistance                         General ADL Comments: sESSION focused on education and bed level mobility. Pt very anxious with therapist arrival. pt tearful at times. Pt expressed several times during session "I just wish I would die" "it might be the chicken way out but i want to die". pt expressed extreme embarrassment with any male (A) for  Donald care and bathing. pt prefers all male staff when possible and understands this can not be provided for all disciplines/ LOS. pt educated OT does not currently have male therapist on staff but he could return to CIR with male (A). Pt states therapist by name from previous admission and asking about how they are doing. pt educated on the need to provided tactile input to LT LE. pt refusing to touch LT LE stating " it will hurt" " it will cause me more pain. I am not going to do that" Pt educated why this is important, demo on Rt LE and encouraged to attempt later today after he felt he was able to attempt. wife presnt and encouraging patient. Pt educated on hip neutral position and extension to prevent hip contraction in a flexion position. Pt educated on the need for pressure relife and pt declined all attempts at repositioning. Recommend mattress overlay. Pt is high risk for break down. pt demonstrates long sitting in bed using RT UE and bed rail to complete task.     Vision                     Perception Perception Perception Tested?:  No   Praxis Praxis Praxis tested?: Not tested    Pertinent Vitals/Pain PCA x1 push     Hand Dominance Right   Extremity/Trunk Assessment Upper Extremity Assessment Upper Extremity Assessment: Overall WFL for tasks assessed   Lower Extremity Assessment Lower Extremity Assessment: Defer to PT evaluation   Cervical / Trunk Assessment Cervical / Trunk Assessment: Normal   Communication Communication Communication: No difficulties   Cognition Arousal/Alertness: Awake/alert Behavior During Therapy: Anxious Overall Cognitive Status: Within Functional Limits for tasks assessed                     General Comments       Exercises       Shoulder Instructions      Home Living Family/patient expects to be discharged to:: Inpatient rehab Living Arrangements: Spouse/significant other   Type of Home: House Home Access: Stairs to  enter [employer] of Steps: 16 steps in the front; 1 in the back (usually enter in the back) Entrance Stairs-Rails: Right;Left Home Layout: Two level;Bed/bath upstairs Alternate Level Stairs-Number of Steps: flight, patient has lift chair Alternate Level Stairs-Rails: Right;Left Bathroom Shower/Tub: Walk-in shower;Door             Additional Comments: Pt has a stair chair lift now for going upstairs.       Prior Functioning/Environment Level of Independence: Needs assistance    ADL's / Homemaking Assistance Needed: pt has wife (A) for adls since CIR admission        OT Diagnosis: Generalized weakness;Acute pain   OT Problem List: Decreased strength;Decreased range of motion;Decreased activity tolerance;Impaired balance (sitting and/or standing);Decreased safety awareness;Decreased knowledge of use of DME or AE;Decreased knowledge of precautions;Pain;Obesity   OT Treatment/Interventions: Self-care/ADL training;Therapeutic exercise;Neuromuscular education;DME and/or AE instruction;Therapeutic activities;Patient/family education;Balance training    OT Goals(Current goals can be found in the care plan section) Acute Rehab OT Goals Patient Stated Goal: to speak with a suporrt group member OT Goal Formulation: With patient/family Time For Goal Achievement: 10/23/13 Potential to Achieve Goals: Good  OT Frequency: Min 2X/week   Barriers to D/C:            Co-evaluation              End of Session Nurse Communication: Mobility status;Precautions;Need for lift equipment  Activity Tolerance: Other (comment) (limited by anxiety) Patient left: in bed;with call bell/phone within reach;with family/visitor present   Time: 2706-2376 OT Time Calculation (min): 26 min Charges:  OT General Charges $OT Visit: 1 Procedure OT Evaluation $Initial OT Evaluation Tier I: 1 Procedure OT Treatments $Self Care/Home Management : 8-22 mins G-Codes:    Donald [name] October 18, 2013, 3:43 PM Pager: [phone]

## 2013-10-09 NOTE — Clinical Documentation Improvement (Signed)
  Eval 6/11 by Registered Dietician states "meets criteria for Severe protein calorie malnutrition in the context of chronic illness as evidenced by weight loss of 8.8% x 1 month and energy intake </=75% of minimum estimated needs >/= 1 month."   Possible Clinical Conditions? - Severe Protein Calorie Malnutrition - Other condition   Supporting Information: - Epic chart documents a weight loss of 8.8% x 1 month which is severe for time frame.   Thank You, Ezekiel Ina ,RN Clinical Documentation Specialist:  [phone]  [facility]

## 2013-10-10 DIAGNOSIS — E1159 Type 2 diabetes mellitus with other circulatory complications: Secondary | ICD-10-CM

## 2013-10-10 DIAGNOSIS — R52 Pain, unspecified: Secondary | ICD-10-CM

## 2013-10-10 LAB — BASIC METABOLIC PANEL
BUN: 13 mg/dL (ref 6–23)
CALCIUM: 9.3 mg/dL (ref 8.4–10.5)
CO2: 23 mEq/L (ref 19–32)
Chloride: 95 mEq/L — ABNORMAL LOW (ref 96–112)
Creatinine, Ser: 0.9 mg/dL (ref 0.50–1.35)
GFR calc Af Amer: >90 mL/min (ref >90)
GFR, EST NON AFRICAN AMERICAN: 85 mL/min — AB (ref >90)
Glucose, Bld: 151 mg/dL — ABNORMAL HIGH (ref 70–99)
Potassium: 4.1 mEq/L (ref 3.7–5.3)
Sodium: 135 mEq/L — ABNORMAL LOW (ref 137–147)

## 2013-10-10 LAB — CBC
HCT: 32.9 % — ABNORMAL LOW (ref 39.0–52.0)
Hemoglobin: 10.1 g/dL — ABNORMAL LOW (ref 13.0–17.0)
MCH: 25.6 pg — ABNORMAL LOW (ref 26.0–34.0)
MCHC: 30.7 g/dL (ref 30.0–36.0)
MCV: 83.3 fL (ref 78.0–100.0)
PLATELETS: 294 10*3/uL (ref 150–400)
RBC: 3.95 MIL/uL — ABNORMAL LOW (ref 4.22–5.81)
RDW: 23.1 % — ABNORMAL HIGH (ref 11.5–15.5)
WBC: 9.6 10*3/uL (ref 4.0–10.5)

## 2013-10-10 LAB — GLUCOSE, CAPILLARY
Glucose-Capillary: 115 mg/dL — ABNORMAL HIGH (ref 70–99)
Glucose-Capillary: 120 mg/dL — ABNORMAL HIGH (ref 70–99)
Glucose-Capillary: 124 mg/dL — ABNORMAL HIGH (ref 70–99)
Glucose-Capillary: 138 mg/dL — ABNORMAL HIGH (ref 70–99)
Glucose-Capillary: 163 mg/dL — ABNORMAL HIGH (ref 70–99)
Glucose-Capillary: 182 mg/dL — ABNORMAL HIGH (ref 70–99)

## 2013-10-10 MED ORDER — SODIUM CHLORIDE 0.9 % IJ SOLN
10.0000 mL | INTRAMUSCULAR | Status: DC | PRN
  Administered 2013-10-10 – 2013-10-13 (×4): 10 mL

## 2013-10-10 MED ORDER — KETOROLAC TROMETHAMINE 30 MG/ML IJ SOLN
15.0000 mg | Freq: Four times a day (QID) | INTRAMUSCULAR | Status: DC
  Administered 2013-10-10 – 2013-10-13 (×10): 15 mg via INTRAVENOUS
  Filled 2013-10-10 (×14): qty 1

## 2013-10-10 NOTE — Progress Notes (Signed)
    Subjective  - POD #status post left above-knee amputation  The patient states that his pain is a little bit better   Physical Exam:  Amputation dressing is clean and dry.  There is slight drainage from the left groin.  No skin separation yet.       Assessment/Plan:  POD #2  The patient's pain is better controlled today with the addition of a PCA and Toradol.  His creatinine has remained stable with the administration of Toradol. The patient's mental status slightly better today, but I'm still concerned about depression.  Psychiatry has been consulted.  I will evaluating his wound tomorrow.  Donald [name] IV, V. WELLS 10/10/2013 11:38 AM --  Filed Vitals:   10/10/13 1030  BP: 168/54  Pulse: 79  Temp:   Resp: 21    Intake/Output Summary (Last 24 hours) at 10/10/13 1138 Last data filed at 10/10/13 1034  Gross per 24 hour  Intake   1370 ml  Output    350 ml  Net   1020 ml     Laboratory CBC    Component Value Date/Time   WBC 9.6 10/10/2013 0539   WBC 7.1 06/05/2013 1010   HGB 10.1* 10/10/2013 0539   HGB 9.3* 06/05/2013 1010   HCT 32.9* 10/10/2013 0539   HCT 30.8* 06/05/2013 1010   PLT 294 10/10/2013 0539   PLT 324 06/05/2013 1010    BMET    Component Value Date/Time   NA 135* 10/10/2013 0539   NA 137 06/05/2013 1010   NA 147* 10/12/2011 0959   K 4.1 10/10/2013 0539   K 5.4* 06/05/2013 1010   K 5.2* 10/12/2011 0959   CL 95* 10/10/2013 0539   CL 102 05/19/2012 1427   CL 102 10/12/2011 0959   CO2 23 10/10/2013 0539   CO2 23 06/05/2013 1010   CO2 29 10/12/2011 0959   GLUCOSE 151* 10/10/2013 0539   GLUCOSE 239* 06/05/2013 1010   GLUCOSE 205* 05/19/2012 1427   GLUCOSE 244* 10/12/2011 0959   BUN 13 10/10/2013 0539   BUN 19.5 06/05/2013 1010   BUN 13 10/12/2011 0959   CREATININE 0.90 10/10/2013 0539   CREATININE 1.4* 06/05/2013 1010   CREATININE 1.0 10/12/2011 0959   CALCIUM 9.3 10/10/2013 0539   CALCIUM 10.1 06/05/2013 1010   CALCIUM 9.0 10/12/2011 0959   GFRNONAA 85* 10/10/2013 0539   GFRAA >90 10/10/2013 0539    COAG Lab Results  Component Value Date   INR 1.06 10/07/2013   INR 1.14 09/05/2013   INR 1.01 09/04/2013   No results found for this basename: PTT    Antibiotics Anti-infectives   Start     Dose/Rate Route Frequency Ordered Stop   10/09/13 0600  [MAR Hold]  ceFAZolin (ANCEF) IVPB 2 g/50 mL premix     (On MAR Hold since 10/08/13 0936)  Comments:  Send with pt to OR   2 g 100 mL/hr over 30 Minutes Intravenous On call 10/08/13 0852 10/08/13 1038   10/08/13 1800  cefUROXime (ZINACEF) 1.5 g in dextrose 5 % 50 mL IVPB     1.5 g 100 mL/hr over 30 Minutes Intravenous Every 12 hours 10/08/13 1342 10/09/13 0645       V. Leia Alf, M.D. [facility] Office: [phone] Pager:  [phone]

## 2013-10-10 NOTE — Progress Notes (Addendum)
PROGRESS NOTE  Donald [name] BTD:[id number] DOB: 10-Feb-1946 DOA: 10/07/2013 PCP: Maggie Font, MD  Brief history 68 y.o. male who was recently admitted for ischemic limb and had undergone left sided thrombectomy and left femoral popliteal bypass graft with patch angioplasty and fasciotomy (May 2015) with wound VAC was eventually discharged to rehabilitation.  The pt was discharged home.  He has been at home last 10 days and  started experiencing worsening pain of his left lower extremity on the day of admission. In the ER patient was not having any palpable pulses down below the knee and his extremity was cool. Patient has previously refused any anticoagulants for his PVD. As a result, he was placed on Aggrenox.  Vascular surgeon, Dr. Kellie Simmering was consulted. He recommeded amputation.  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 Postoperatively, the patient had uncontrolled pain and was started on a morphine PCA as well as Toradol. He also suffered from situational depression, but he denied any suicidal ideation. Psychiatry was consulted.  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 Trula Slade  -continue aggrenox  -pt refuses warfarin  -pain control-->morphine PCA started 10/09/13 with improving pain relief  -cathartics  -CIR has been consulted  -pt seen with Dr. Trula Slade at beside--L-groin bleed at old fistulous tract site-->scant amount--continue to monitor, pressure dressing for now -Hemoglobin remained stable Diabetes mellitus type 2  -09/06/2013 Hemoglobin of 7.7  -Half home dose of Levemir  -NovoLog sliding scale  -CBGs controlled  Tobacco abuse  -Tobacco cessation discussed  -Declines nicotine patch  Hypertension  -Restart lisinopril  Chronic pain/uncontrolled after sugery  -Restart OxyContin 20 mg every 12 hours  -OxyIR 5 mg every 4 hours when necessary pain  -morphine PCA as above    -Decrease Toradol 15 mg due to the patient's age Normocytic anemia  -Likely due to chronic disease  -Check iron studies in the morning  -Monitor CBC--hemoglobin stable Hx of rectal cancer  -oncologist is Dr. Alen Blew  Situational Depression  -continue to monitor  -denies suicidal ideation  -consulted psychiatry Severe Protein Calorie Malnutrition  -nutritional supplementation   Family Communication:   Pt at beside Disposition Plan:   CIR when medically stable       Procedures/Studies:  No results found.      Subjective: Patient still in a depressed mood. Denies suicidal ideation. States his pain is better controlled with morphine PCA. Denies any fevers, chills, chest pain, shortness breath, nausea, vomiting, diarrhea.  Objective: Filed Vitals:   10/10/13 0758 10/10/13 0952 10/10/13 1009 10/10/13 1030  BP:  117/39  168/54  Pulse:  69 90 79  Temp:  98.3 F (36.8 C)    TempSrc:  Oral    Resp: 11 13  21   Height:      Weight:      SpO2: 97% 95%  98%    Intake/Output Summary (Last 24 hours) at 10/10/13 1037 Last data filed at 10/10/13 0930  Gross per 24 hour  Intake   1370 ml  Output    150 ml  Net   1220 ml   Weight change:  Exam:   General:  Pt is alert, follows commands appropriately, not in acute distress  HEENT: No icterus, No thrush,[place]/AT  Cardiovascular: RRR, S1/S2, no rubs, no gallops  Respiratory: Diminished breath sounds at the bases but clear to auscultation  Abdomen: Soft/+BS, non tender, non distended, no guarding  Extremities: Left groin--old fistulous tract site with scant amount of bleeding without any crepitance or ecchymosis. Left AKA stump without active bleeding.  Data Reviewed: Basic Metabolic Panel:  Recent Labs Lab 10/07/13 1748 10/08/13 0430 10/09/13 0500 10/10/13 0539  NA 137 139 136* 135*  K 4.6 4.4 4.1 4.1  CL 99 101 96 95*  CO2 22 22 22 23   GLUCOSE 116* 118* 109* 151*  BUN 11 9 7 13   CREATININE 0.94 0.82 0.77  0.90  CALCIUM 9.4 9.2 8.9 9.3   Liver Function Tests:  Recent Labs Lab 10/08/13 0430  AST 16  ALT 8  ALKPHOS 82  BILITOT 0.4  PROT 6.9  ALBUMIN 3.1*   No results found for this basename: LIPASE, AMYLASE,  in the last 168 hours No results found for this basename: AMMONIA,  in the last 168 hours CBC:  Recent Labs Lab 10/07/13 1748 10/08/13 0430 10/09/13 0500 10/10/13 0539  WBC 7.7 7.4 9.6 9.6  NEUTROABS 5.5 5.1  --   --   HGB 9.8* 9.9* 9.1* 10.1*  HCT 32.3* 33.1* 30.2* 32.9*  MCV 82.4 83.0 83.2 83.3  PLT 284 287 285 294   Cardiac Enzymes: No results found for this basename: CKTOTAL, CKMB, CKMBINDEX, TROPONINI,  in the last 168 hours BNP: No components found with this basename: POCBNP,  CBG:  Recent Labs Lab 10/09/13 1605 10/09/13 1943 10/10/13 0038 10/10/13 0451 10/10/13 0742  GLUCAP 98 103* 163* 120* 182*    Recent Results (from the past 240 hour(s))  SURGICAL PCR SCREEN     Status: None   Collection Time    10/08/13 12:12 AM      Result Value Ref Range Status   MRSA, PCR NEGATIVE  NEGATIVE Final   Staphylococcus aureus NEGATIVE  NEGATIVE Final   Comment:            The Xpert SA Assay (FDA     approved for NASAL specimens     in patients over 84 years of age),     is one component of     a comprehensive surveillance     program.  Test performance has     been validated by [employer] for patients greater     than or equal to 24 year old.     It is not intended     to diagnose infection nor to     guide or monitor treatment.     Scheduled Meds: . dipyridamole-aspirin  1 capsule Oral BID  . docusate sodium  100 mg Oral BID  . enoxaparin (LOVENOX) injection  40 mg Subcutaneous Q24H  . feeding supplement (GLUCERNA SHAKE)  237 mL Oral Daily  . insulin aspart  0-9 Units Subcutaneous TID WC  . insulin glargine  5 Units Subcutaneous QHS  . ketorolac  30 mg Intravenous 4 times per day  . lisinopril  2.5 mg Oral Daily  . morphine   Intravenous 6  times per day  . multivitamin with minerals  1 tablet Oral Daily  . OxyCODONE  20 mg Oral Q12H  . pantoprazole  40 mg Oral Daily  . senna  2 tablet Oral QHS  . traZODone  25 mg Oral QHS   Continuous Infusions: . lactated ringers 50 mL/hr at 10/09/13 1836     Coleby Yett, DO  [facility] Pager [phone]  If 7PM-7AM, please contact night-coverage [email or web address] Password [facility] 10/10/2013, 10:37 AM   LOS: 3 days

## 2013-10-10 NOTE — Evaluation (Signed)
Physical Therapy Evaluation Patient Details Name: Donald [name] MRN: [id number] DOB: 06-28-1945 Today's Date: 10/10/2013   History of Present Illness  68 y.o. male s/p Lt aka.Pt recently d/c from [place] in May 2015 for Lt femoral-popliteal bypass. 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  Clinical Impression  Pt admitted with/for failed BPG, s/p L AKA.  Pt currently limited functionally due to the problems listed. ( See problems list.)   Pt will benefit from PT to maximize function and safety in order to get ready for next venue listed below.     Follow Up Recommendations CIR    Equipment Recommendations  Other (comment) (TBA)    Recommendations for Other Services Rehab consult     Precautions / Restrictions Precautions Precautions: Fall Precaution Comments: LT AKA wrap       Mobility  Bed Mobility Overal bed mobility: Needs Assistance Bed Mobility: Supine to Sit;Sit to Supine     Supine to sit: +2 for safety/equipment;Max assist Sit to supine: Mod assist;+2 for physical assistance   General bed mobility comments:  Bridged to EOB with max, up to EOB with heavy mod assist, scooted to EOB with mod.; cues for had placement  Transfers                 General transfer comment: declined trying transfer of continuing to sit EOB  Ambulation/Gait                Stairs            Wheelchair Mobility    Modified Rankin (Stroke Patients Only)       Balance Overall balance assessment: Needs assistance Sitting-balance support: Single extremity supported;Bilateral upper extremity supported Sitting balance-Leahy Scale: Poor Sitting balance - Comments: pt sat EOB approx 2 min and in that time he became anxious to the point of panic.  Unable to sit without UE support in this state.                                     Pertinent Vitals/Pain Not rated, but heavy  use of PCA    Home Living Family/patient expects to be discharged to:: Inpatient rehab Living Arrangements: Spouse/significant other   Type of Home: House Home Access: Stairs to enter Entrance Stairs-Rails: Right;Left Entrance Stairs-Number of Steps: 16 steps in the front; 1 in the back (usually enter in the back) Home Layout: Two level;Bed/bath upstairs   Additional Comments: Pt has a stair chair lift now for going upstairs.     Prior Function Level of Independence: Needs assistance      ADL's / Homemaking Assistance Needed: pt has wife (A) for adls since CIR admission        Hand Dominance   Dominant Hand: Right    Extremity/Trunk Assessment   Upper Extremity Assessment: Defer to OT evaluation;Generalized weakness           Lower Extremity Assessment: RLE deficits/detail;LLE deficits/detail RLE Deficits / Details: generally weak--quads 4/5, hams hip flexors 4-/5, df/pf 3+/5 LLE Deficits / Details: painful and move little against gravity or in ab/add  Cervical / Trunk Assessment: Normal  Communication   Communication: No difficulties  Cognition Arousal/Alertness: Awake/alert Behavior During Therapy: Anxious Overall Cognitive Status: Within Functional Limits for tasks assessed  General Comments      Exercises        Assessment/Plan    PT Assessment Patient needs continued PT services  PT Diagnosis Acute pain   PT Problem List Decreased strength;Decreased range of motion;Decreased activity tolerance;Decreased balance;Decreased mobility;Decreased knowledge of use of DME;Pain  PT Treatment Interventions Functional mobility training;Therapeutic activities;Balance training;Therapeutic exercise;Patient/family education;Other (comment) (pregait training)   PT Goals (Current goals can be found in the Care Plan section) Acute Rehab PT Goals Patient Stated Goal: to speak with a suporrt group member PT Goal Formulation: With  patient Time For Goal Achievement: 10/24/13 Potential to Achieve Goals: Good    Frequency Min 3X/week   Barriers to discharge Inaccessible home environment has steps to get in home    Co-evaluation               End of Session   Activity Tolerance: Other (comment);Patient limited by pain (treatment limited by pt anxiety) Patient left: with call bell/phone within reach Nurse Communication: Mobility status         Time: 1430-1457 PT Time Calculation (min): 27 min   Charges:   PT Evaluation $Initial PT Evaluation Tier I: 1 Procedure PT Treatments $Therapeutic Activity: 8-22 mins   PT G Codes:          Gopal Malter, Jamir Rone 10/10/2013, 4:30 PM 10/10/2013  Donnella Sham, [place] [phone]  (pager)

## 2013-10-10 NOTE — Progress Notes (Signed)
Bleeding noted from Left groin, pressure dressing applied.  BP117/39, manual pulse is 90 on the right radial.  Will continue to monitor.  Dr. Carles Collet texted.

## 2013-10-11 DIAGNOSIS — F39 Unspecified mood [affective] disorder: Secondary | ICD-10-CM

## 2013-10-11 LAB — CBC
HCT: 30.1 % — ABNORMAL LOW (ref 39.0–52.0)
Hemoglobin: 9.1 g/dL — ABNORMAL LOW (ref 13.0–17.0)
MCH: 25.4 pg — ABNORMAL LOW (ref 26.0–34.0)
MCHC: 30.2 g/dL (ref 30.0–36.0)
MCV: 84.1 fL (ref 78.0–100.0)
PLATELETS: 283 10*3/uL (ref 150–400)
RBC: 3.58 MIL/uL — AB (ref 4.22–5.81)
RDW: 23.2 % — ABNORMAL HIGH (ref 11.5–15.5)
WBC: 9.6 10*3/uL (ref 4.0–10.5)

## 2013-10-11 LAB — BASIC METABOLIC PANEL
BUN: 14 mg/dL (ref 6–23)
CALCIUM: 9.2 mg/dL (ref 8.4–10.5)
CHLORIDE: 97 meq/L (ref 96–112)
CO2: 23 meq/L (ref 19–32)
Creatinine, Ser: 0.92 mg/dL (ref 0.50–1.35)
GFR calc non Af Amer: 84 mL/min — ABNORMAL LOW (ref >90)
Glucose, Bld: 136 mg/dL — ABNORMAL HIGH (ref 70–99)
Potassium: 4.3 mEq/L (ref 3.7–5.3)
SODIUM: 137 meq/L (ref 137–147)

## 2013-10-11 LAB — GLUCOSE, CAPILLARY
GLUCOSE-CAPILLARY: 102 mg/dL — AB (ref 70–99)
Glucose-Capillary: 131 mg/dL — ABNORMAL HIGH (ref 70–99)
Glucose-Capillary: 142 mg/dL — ABNORMAL HIGH (ref 70–99)
Glucose-Capillary: 149 mg/dL — ABNORMAL HIGH (ref 70–99)
Glucose-Capillary: 167 mg/dL — ABNORMAL HIGH (ref 70–99)

## 2013-10-11 LAB — IRON AND TIBC
IRON: 11 ug/dL — AB (ref 42–135)
Saturation Ratios: 4 % — ABNORMAL LOW (ref 20–55)
TIBC: 281 ug/dL (ref 215–435)
UIBC: 270 ug/dL (ref 125–400)

## 2013-10-11 LAB — FERRITIN: FERRITIN: 36 ng/mL (ref 22–322)

## 2013-10-11 MED ORDER — BISACODYL 10 MG RE SUPP
10.0000 mg | Freq: Once | RECTAL | Status: AC
  Administered 2013-10-11: 10 mg via RECTAL
  Filled 2013-10-11: qty 1

## 2013-10-11 MED ORDER — DULOXETINE HCL 30 MG PO CPEP
30.0000 mg | ORAL_CAPSULE | Freq: Every day | ORAL | Status: DC
  Administered 2013-10-11 – 2013-10-13 (×3): 30 mg via ORAL
  Filled 2013-10-11 (×3): qty 1

## 2013-10-11 NOTE — Consult Note (Signed)
[facility] Consult   Reason for Consult:  Depression and s/p surgery (Left AKA) Referring Physician:  Dr. Rosezena Sensor is an 68 y.o. male. Total Time spent with patient: 45 minutes  Assessment: AXIS I:  Mood Disorder NOS AXIS II:  Deferred AXIS III:   Past Medical History  Diagnosis Date  . GERD (gastroesophageal reflux disease)   . Hyperlipidemia   . Hypertension   . Peripheral vascular disease   . Internal and external bleeding hemorrhoids   . History of gout     left great toe  . Depression   . Numbness in right leg     Hx: of diabetes  . Coronary artery disease     RCA occlusion with good collaterals, o/w no obs CAD 12/2011  . Pneumonia ~ 1977  . Type II diabetes mellitus   . History of blood transfusion 12/2011; 12/2012    "before OR; after OR" (01/22/2013)  . Stroke 2008    "left side weak; unable to move left hand still" (01/22/2013)  . Rectal cancer dx'd 08/2007    S/P chemo, radiation, biopsies  . Metastases to the liver dx'd 08/2010   AXIS IV:  other psychosocial or environmental problems, problems related to social environment and problems with primary support group AXIS V:  41-50 serious symptoms  Plan: Will start Cymbalta 30 mg PO QD starting now dose Case discussed with Dr. Carles Collet No evidence of imminent risk to self or others at present.   Patient does not meet criteria for psychiatric inpatient admission. Discussed crisis plan, support from social network, calling 911, coming to the Emergency Department, and calling Suicide Hotline. Will consider SSRI and sleep medication Appreciate psychiatric consultation Please contact [phone] if needs further assistance  Subjective:   PHOENIX DRESSER is a 68 y.o. male patient admitted with s/p Left AKA.  HPI: Patient is seen, chart reviewed and case discussed with Dr. Carles Collet and patient wife who was at next to the bed. Patient has been struglinging with severe symptoms of depression, sad,  tearful, anxious and worried about his current medical situation. He has satisfied with his pain control with medication and also sleeping with the help of current medications. He has no previous history of mental illness or psychiatric hospitalizations. Patient has denied psychosis and suicidal or homicidal ideations. Patient consented for medication trial after brief discussion about risk and side effects. Patient family stated that he has multiple medical problems including anal fissure and constipation.   Medical histroy: 68 y.o. male who was recently admitted for ischemic limb and had undergone left sided thrombectomy and left femoral popliteal bypass graft with patch angioplasty and fasciotomy (May 2015) with wound VAC was eventually discharged to rehabilitation. The pt was discharged home. He has been at home last 10 days and started experiencing worsening pain of his left lower extremity on the day of admission. In the ER patient was not having any palpable pulses down below the knee and his extremity was cool. Patient has previously refused any anticoagulants for his PVD. As a result, he was placed on Aggrenox. Vascular surgeon, Dr. Kellie Simmering was consulted. He recommeded amputation. 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 Postoperatively, the patient had uncontrolled pain and was started on a morphine PCA as well as Toradol. He also suffered from situational depression, but he denied any suicidal ideation. Psychiatry was consulted.  HPI Elements:   Location:  depression. Quality:  poor. Severity:  acute and severe.  Timing:  status post left AKA and multiple medical problems.  Past Psychiatric History: Past Medical History  Diagnosis Date  . GERD (gastroesophageal reflux disease)   . Hyperlipidemia   . Hypertension   . Peripheral vascular disease   . Internal and external bleeding hemorrhoids   . History of gout     left great toe  . Depression   . Numbness in right leg     Hx:  of diabetes  . Coronary artery disease     RCA occlusion with good collaterals, o/w no obs CAD 12/2011  . Pneumonia ~ 1977  . Type II diabetes mellitus   . History of blood transfusion 12/2011; 12/2012    "before OR; after OR" (01/22/2013)  . Stroke 2008    "left side weak; unable to move left hand still" (01/22/2013)  . Rectal cancer dx'd 08/2007    S/P chemo, radiation, biopsies  . 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 His smoking use included Cigarettes. He has a 5.7 pack-year smoking history. He has never used smokeless tobacco. He reports that he does not drink alcohol or use illicit drugs. Family History  Problem Relation Age of Onset  . Cancer - Other Sister      Living Arrangements: Spouse/significant other   Abuse/Neglect [facility]) Physical Abuse: Denies Verbal Abuse: Denies Sexual Abuse: Denies Allergies:   Allergies  Allergen Reactions  . Other     Became hypotensive. Pt states allergic to "muscle relaxer" but unsure of name.  . Tape Hives and Itching    medfix non woven tape    ACT Assessment Complete:  NO Objective: Blood pressure 157/52, pulse 75, temperature 99.1 F (37.3 C), temperature source Oral, resp. rate 10, height $RemoveBe'5\' 9"'WDigeKwCT$  (1.753 m), weight 75.297 kg (166 lb), SpO2 98.00%.Body mass index is 24.5 kg/(m^2). Results for orders placed during the hospital encounter of 10/07/13 (from the past 72 hour(s))  GLUCOSE, CAPILLARY     Status: Abnormal   Collection Time    10/08/13  4:49 PM      Result Value Ref Range   Glucose-Capillary 142 (*) 70 - 99 mg/dL   Comment 1 Notify RN    GLUCOSE, CAPILLARY     Status: Abnormal   Collection Time    10/08/13  7:55 PM      Result Value Ref Range   Glucose-Capillary 145 (*) 70 - 99 mg/dL   Comment 1 Notify RN    GLUCOSE, CAPILLARY     Status: Abnormal   Collection Time    10/08/13 11:45 PM      Result Value Ref Range   Glucose-Capillary 137 (*) 70 - 99 mg/dL   Comment 1 Notify RN     GLUCOSE, CAPILLARY     Status: Abnormal   Collection Time    10/09/13  4:51 AM      Result Value Ref Range   Glucose-Capillary 120 (*) 70 - 99 mg/dL   Comment 1 Notify RN    BASIC METABOLIC PANEL     Status: Abnormal   Collection Time    10/09/13  5:00 AM      Result Value Ref Range   Sodium 136 (*) 137 - 147 mEq/L   Potassium 4.1  3.7 - 5.3 mEq/L   Chloride 96  96 - 112 mEq/L   CO2 22  19 - 32 mEq/L   Glucose, Bld 109 (*) 70 - 99 mg/dL   BUN 7  6 - 23 mg/dL   Creatinine, Ser 0.77  0.50 - 1.35 mg/dL   Calcium 8.9  8.4 - 10.5 mg/dL   GFR calc non Af Amer >90  >90 mL/min   GFR calc Af Amer >90  >90 mL/min   Comment: (NOTE)     The eGFR has been calculated using the CKD EPI equation.     This calculation has not been validated in all clinical situations.     eGFR's persistently <90 mL/min signify possible Chronic Kidney     Disease.  CBC     Status: Abnormal   Collection Time    10/09/13  5:00 AM      Result Value Ref Range   WBC 9.6  4.0 - 10.5 K/uL   RBC 3.63 (*) 4.22 - 5.81 MIL/uL   Hemoglobin 9.1 (*) 13.0 - 17.0 g/dL   HCT 30.2 (*) 39.0 - 52.0 %   MCV 83.2  78.0 - 100.0 fL   MCH 25.1 (*) 26.0 - 34.0 pg   MCHC 30.1  30.0 - 36.0 g/dL   RDW 23.1 (*) 11.5 - 15.5 %   Platelets 285  150 - 400 K/uL  GLUCOSE, CAPILLARY     Status: Abnormal   Collection Time    10/09/13  7:52 AM      Result Value Ref Range   Glucose-Capillary 118 (*) 70 - 99 mg/dL  GLUCOSE, CAPILLARY     Status: Abnormal   Collection Time    10/09/13 12:11 PM      Result Value Ref Range   Glucose-Capillary 110 (*) 70 - 99 mg/dL  GLUCOSE, CAPILLARY     Status: None   Collection Time    10/09/13  4:05 PM      Result Value Ref Range   Glucose-Capillary 98  70 - 99 mg/dL  GLUCOSE, CAPILLARY     Status: Abnormal   Collection Time    10/09/13  7:43 PM      Result Value Ref Range   Glucose-Capillary 103 (*) 70 - 99 mg/dL   Comment 1 Notify RN    GLUCOSE, CAPILLARY     Status: Abnormal   Collection Time     10/10/13 12:38 AM      Result Value Ref Range   Glucose-Capillary 163 (*) 70 - 99 mg/dL   Comment 1 Notify RN    GLUCOSE, CAPILLARY     Status: Abnormal   Collection Time    10/10/13  4:51 AM      Result Value Ref Range   Glucose-Capillary 120 (*) 70 - 99 mg/dL   Comment 1 Notify RN    BASIC METABOLIC PANEL     Status: Abnormal   Collection Time    10/10/13  5:39 AM      Result Value Ref Range   Sodium 135 (*) 137 - 147 mEq/L   Potassium 4.1  3.7 - 5.3 mEq/L   Chloride 95 (*) 96 - 112 mEq/L   CO2 23  19 - 32 mEq/L   Glucose, Bld 151 (*) 70 - 99 mg/dL   BUN 13  6 - 23 mg/dL   Creatinine, Ser 0.90  0.50 - 1.35 mg/dL   Calcium 9.3  8.4 - 10.5 mg/dL   GFR calc non Af Amer 85 (*) >90 mL/min   GFR calc Af Amer >90  >90 mL/min   Comment: (NOTE)     The eGFR has been calculated using the CKD EPI equation.  This calculation has not been validated in all clinical situations.     eGFR's persistently <90 mL/min signify possible Chronic Kidney     Disease.  CBC     Status: Abnormal   Collection Time    10/10/13  5:39 AM      Result Value Ref Range   WBC 9.6  4.0 - 10.5 K/uL   RBC 3.95 (*) 4.22 - 5.81 MIL/uL   Hemoglobin 10.1 (*) 13.0 - 17.0 g/dL   HCT 32.9 (*) 39.0 - 52.0 %   MCV 83.3  78.0 - 100.0 fL   MCH 25.6 (*) 26.0 - 34.0 pg   MCHC 30.7  30.0 - 36.0 g/dL   RDW 23.1 (*) 11.5 - 15.5 %   Platelets 294  150 - 400 K/uL  GLUCOSE, CAPILLARY     Status: Abnormal   Collection Time    10/10/13  7:42 AM      Result Value Ref Range   Glucose-Capillary 182 (*) 70 - 99 mg/dL  GLUCOSE, CAPILLARY     Status: Abnormal   Collection Time    10/10/13 11:47 AM      Result Value Ref Range   Glucose-Capillary 124 (*) 70 - 99 mg/dL  GLUCOSE, CAPILLARY     Status: Abnormal   Collection Time    10/10/13  3:56 PM      Result Value Ref Range   Glucose-Capillary 115 (*) 70 - 99 mg/dL  GLUCOSE, CAPILLARY     Status: Abnormal   Collection Time    10/10/13  9:06 PM      Result Value Ref  Range   Glucose-Capillary 138 (*) 70 - 99 mg/dL   Comment 1 Notify RN    GLUCOSE, CAPILLARY     Status: Abnormal   Collection Time    10/11/13 12:06 AM      Result Value Ref Range   Glucose-Capillary 149 (*) 70 - 99 mg/dL   Comment 1 Notify RN    CBC     Status: Abnormal   Collection Time    10/11/13  5:54 AM      Result Value Ref Range   WBC 9.6  4.0 - 10.5 K/uL   RBC 3.58 (*) 4.22 - 5.81 MIL/uL   Hemoglobin 9.1 (*) 13.0 - 17.0 g/dL   HCT 30.1 (*) 39.0 - 52.0 %   MCV 84.1  78.0 - 100.0 fL   MCH 25.4 (*) 26.0 - 34.0 pg   MCHC 30.2  30.0 - 36.0 g/dL   RDW 23.2 (*) 11.5 - 15.5 %   Platelets 283  150 - 400 K/uL  BASIC METABOLIC PANEL     Status: Abnormal   Collection Time    10/11/13  5:54 AM      Result Value Ref Range   Sodium 137  137 - 147 mEq/L   Potassium 4.3  3.7 - 5.3 mEq/L   Chloride 97  96 - 112 mEq/L   CO2 23  19 - 32 mEq/L   Glucose, Bld 136 (*) 70 - 99 mg/dL   BUN 14  6 - 23 mg/dL   Creatinine, Ser 0.92  0.50 - 1.35 mg/dL   Calcium 9.2  8.4 - 10.5 mg/dL   GFR calc non Af Amer 84 (*) >90 mL/min   GFR calc Af Amer >90  >90 mL/min   Comment: (NOTE)     The eGFR has been calculated using the CKD EPI equation.     This calculation has  not been validated in all clinical situations.     eGFR's persistently <90 mL/min signify possible Chronic Kidney     Disease.  GLUCOSE, CAPILLARY     Status: Abnormal   Collection Time    10/11/13  7:58 AM      Result Value Ref Range   Glucose-Capillary 142 (*) 70 - 99 mg/dL  GLUCOSE, CAPILLARY     Status: Abnormal   Collection Time    10/11/13 11:55 AM      Result Value Ref Range   Glucose-Capillary 167 (*) 70 - 99 mg/dL   Labs are reviewed and are pertinent for as above.  Current Facility-Administered Medications  Medication Dose Route Frequency Provider Last Rate Last Dose  . acetaminophen (TYLENOL) tablet 650 mg  650 mg Oral Q6H PRN Rise Patience, MD       Or  . acetaminophen (TYLENOL) suppository 650 mg  650 mg  Rectal Q6H PRN Rise Patience, MD      . alum & mag hydroxide-simeth (MAALOX/MYLANTA) 200-200-20 MG/5ML suspension 15-30 mL  15-30 mL Oral Q2H PRN Ulyses Amor, PA-C      . diphenhydrAMINE (BENADRYL) injection 12.5 mg  12.5 mg Intravenous Q6H PRN Serafina Mitchell, MD       Or  . diphenhydrAMINE (BENADRYL) 12.5 MG/5ML elixir 12.5 mg  12.5 mg Oral Q6H PRN Serafina Mitchell, MD      . dipyridamole-aspirin (AGGRENOX) 200-25 MG per 12 hr capsule 1 capsule  1 capsule Oral BID Orson Eva, MD   1 capsule at 10/11/13 1051  . docusate sodium (COLACE) capsule 100 mg  100 mg Oral BID Orson Eva, MD   100 mg at 10/11/13 1052  . enoxaparin (LOVENOX) injection 40 mg  40 mg Subcutaneous Q24H Ulyses Amor, PA-C   40 mg at 10/10/13 2220  . feeding supplement (GLUCERNA SHAKE) (GLUCERNA SHAKE) liquid 237 mL  237 mL Oral Daily Reanne Maryland Pink, RD      . guaiFENesin-dextromethorphan (ROBITUSSIN DM) 100-10 MG/5ML syrup 15 mL  15 mL Oral Q4H PRN Ulyses Amor, PA-C      . hydrALAZINE (APRESOLINE) injection 10 mg  10 mg Intravenous Q4H PRN Rise Patience, MD   10 mg at 10/10/13 0456  . insulin aspart (novoLOG) injection 0-9 Units  0-9 Units Subcutaneous TID WC Rise Patience, MD   1 Units at 10/11/13 [phone]  . insulin glargine (LANTUS) injection 5 Units  5 Units Subcutaneous QHS Rise Patience, MD   5 Units at 10/10/13 2220  . ketorolac (TORADOL) 30 MG/ML injection 15 mg  15 mg Intravenous 4 times per day Orson Eva, MD   15 mg at 10/11/13 0609  . labetalol (NORMODYNE,TRANDATE) injection 10 mg  10 mg Intravenous Q2H PRN Ulyses Amor, PA-C      . lactated ringers infusion   Intravenous Continuous Finis Bud, MD 50 mL/hr at 10/09/13 1836    . lisinopril (PRINIVIL,ZESTRIL) tablet 2.5 mg  2.5 mg Oral Daily Orson Eva, MD   2.5 mg at 10/11/13 1052  . magnesium sulfate IVPB 2 g 50 mL  2 g Intravenous Daily PRN Ulyses Amor, PA-C      . metoprolol (LOPRESSOR) injection 2-5 mg  2-5 mg Intravenous Q2H PRN  Ulyses Amor, PA-C      . morphine 1 MG/ML PCA injection   Intravenous 6 times per day Serafina Mitchell, MD   2.93 mg at 10/11/13 1008  . morphine 2  MG/ML injection 2 mg  2 mg Intravenous Q2H PRN Hulen Shouts Rhyne, PA-C   2 mg at 10/09/13 1053  . multivitamin with minerals tablet 1 tablet  1 tablet Oral Daily Baird Lyons, RD   1 tablet at 10/11/13 1052  . naloxone [facility]) injection 0.4 mg  0.4 mg Intravenous PRN Serafina Mitchell, MD       And  . sodium chloride 0.9 % injection 9 mL  9 mL Intravenous PRN Serafina Mitchell, MD      . ondansetron [facility]) tablet 4 mg  4 mg Oral Q6H PRN Rise Patience, MD       Or  . ondansetron [facility]) injection 4 mg  4 mg Intravenous Q6H PRN Rise Patience, MD      . oxyCODONE (Oxy IR/ROXICODONE) immediate release tablet 5 mg  5 mg Oral Q4H PRN Orson Eva, MD   5 mg at 10/09/13 0426  . OxyCODONE (OXYCONTIN) 12 hr tablet 20 mg  20 mg Oral Q12H Orson Eva, MD   20 mg at 10/11/13 1052  . oxyCODONE-acetaminophen (PERCOCET/ROXICET) 5-325 MG per tablet 1-2 tablet  1-2 tablet Oral Q4H PRN Ulyses Amor, PA-C   2 tablet at 10/09/13 1150  . pantoprazole (PROTONIX) EC tablet 40 mg  40 mg Oral Daily Ulyses Amor, PA-C   40 mg at 10/11/13 1055  . phenol (CHLORASEPTIC) mouth spray 1 spray  1 spray Mouth/Throat PRN Ulyses Amor, PA-C      . potassium chloride SA (K-DUR,KLOR-CON) CR tablet 20-40 mEq  20-40 mEq Oral Daily PRN Ulyses Amor, PA-C      . senna (SENOKOT) tablet 17.2 mg  2 tablet Oral QHS Orson Eva, MD   17.2 mg at 10/10/13 2220  . sodium chloride 0.9 % injection 10-40 mL  10-40 mL Intracatheter PRN Orson Eva, MD   10 mL at 10/11/13 0553  . traZODone (DESYREL) tablet 25 mg  25 mg Oral QHS Orson Eva, MD   25 mg at 10/10/13 2220    Psychiatric Specialty Exam: Physical Exam  ROS  Blood pressure 157/52, pulse 75, temperature 99.1 F (37.3 C), temperature source Oral, resp. rate 10, height $RemoveBe'5\' 9"'KTtoKyOpN$  (1.753 m), weight 75.297 kg (166 lb), SpO2 98.00%.Body  mass index is 24.5 kg/(m^2).  General Appearance: Guarded  Eye Contact::  Good  Speech:  Clear and Coherent  Volume:  Decreased  Mood:  Depressed  Affect:  Tearful  Thought Process:  Coherent and Goal Directed  Orientation:  Full (Time, Place, and Person)  Thought Content:  WDL  Suicidal Thoughts:  No  Homicidal Thoughts:  No  Memory:  Immediate;   Fair  Judgement:  Good  Insight:  Good  Psychomotor Activity:  Decreased  Concentration:  Good  Recall:  Good  Fund of Knowledge:Good  Language: Good  Akathisia:  NA  Handed:  Right  AIMS (if indicated):     Assets:  Communication Skills Desire for [employer]/Skills Transportation  Sleep:      Musculoskeletal: Strength & Muscle Tone: within normal limits Gait & Station: unable to stand Patient leans: N/A  Treatment Plan Summary: Daily contact with patient to assess and evaluate symptoms and progress in treatment Medication management  Drayke Grabel,JANARDHAHA R. 10/11/2013 2:11 PM

## 2013-10-11 NOTE — Progress Notes (Signed)
    Subjective  - POD #3  Spirits are improved today.  His attitude is slightly more positive.  His pain is adequately controlled.   Physical Exam:  Surgical dressing was removed today.  The amputation site appears to be healing       Assessment/Plan:  POD #3  I am pleased with the progress the patient has made overnight with regards to his attitude and desire to get better.  He was able to sit on the side of the bed yesterday.  His wound is healing adequately.  Continue with emotional support.  Hopefully he will be a candidate for rehabilitation.  BRABHAM IV, V. WELLS 10/11/2013 9:51 AM --  Filed Vitals:   10/11/13 0800  BP:   Pulse:   Temp:   Resp: 12    Intake/Output Summary (Last 24 hours) at 10/11/13 0951 Last data filed at 10/11/13 0830  Gross per 24 hour  Intake    650 ml  Output    500 ml  Net    150 ml     Laboratory CBC    Component Value Date/Time   WBC 9.6 10/11/2013 0554   WBC 7.1 06/05/2013 1010   HGB 9.1* 10/11/2013 0554   HGB 9.3* 06/05/2013 1010   HCT 30.1* 10/11/2013 0554   HCT 30.8* 06/05/2013 1010   PLT 283 10/11/2013 0554   PLT 324 06/05/2013 1010    BMET    Component Value Date/Time   NA 137 10/11/2013 0554   NA 137 06/05/2013 1010   NA 147* 10/12/2011 0959   K 4.3 10/11/2013 0554   K 5.4* 06/05/2013 1010   K 5.2* 10/12/2011 0959   CL 97 10/11/2013 0554   CL 102 05/19/2012 1427   CL 102 10/12/2011 0959   CO2 23 10/11/2013 0554   CO2 23 06/05/2013 1010   CO2 29 10/12/2011 0959   GLUCOSE 136* 10/11/2013 0554   GLUCOSE 239* 06/05/2013 1010   GLUCOSE 205* 05/19/2012 1427   GLUCOSE 244* 10/12/2011 0959   BUN 14 10/11/2013 0554   BUN 19.5 06/05/2013 1010   BUN 13 10/12/2011 0959   CREATININE 0.92 10/11/2013 0554   CREATININE 1.4* 06/05/2013 1010   CREATININE 1.0 10/12/2011 0959   CALCIUM 9.2 10/11/2013 0554   CALCIUM 10.1 06/05/2013 1010   CALCIUM 9.0 10/12/2011 0959   GFRNONAA 84* 10/11/2013 0554   GFRAA >90 10/11/2013 0554    COAG Lab Results  Component  Value Date   INR 1.06 10/07/2013   INR 1.14 09/05/2013   INR 1.01 09/04/2013   No results found for this basename: PTT    Antibiotics Anti-infectives   Start     Dose/Rate Route Frequency Ordered Stop   10/09/13 0600  [MAR Hold]  ceFAZolin (ANCEF) IVPB 2 g/50 mL premix     (On MAR Hold since 10/08/13 0936)  Comments:  Send with pt to OR   2 g 100 mL/hr over 30 Minutes Intravenous On call 10/08/13 0852 10/08/13 1038   10/08/13 1800  cefUROXime (ZINACEF) 1.5 g in dextrose 5 % 50 mL IVPB     1.5 g 100 mL/hr over 30 Minutes Intravenous Every 12 hours 10/08/13 1342 10/09/13 0645       V. Leia Alf, M.D. [facility] Office: [phone] Pager:  [phone]

## 2013-10-11 NOTE — Progress Notes (Signed)
PROGRESS NOTE  Donald [name]:[id number] DOB: [birth year] DOA: 10/07/2013 PCP: Maggie Font, MD  Brief history  68 y.o. male who was recently admitted for ischemic limb and had undergone left sided thrombectomy and left femoral popliteal bypass graft with patch angioplasty and fasciotomy (May 2015) with wound VAC was eventually discharged to rehabilitation. The pt was discharged home. He has been at home last 10 days and started experiencing worsening pain of his left lower extremity on the day of admission. In the ER patient was not having any palpable pulses down below the knee and his extremity was cool. Patient has previously refused any anticoagulants for his PVD. As a result, he was placed on Aggrenox. Vascular surgeon, Dr. Kellie Simmering was consulted. He recommeded amputation. 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 Postoperatively, the patient had uncontrolled pain and was started on a morphine PCA as well as Toradol. He also suffered from situational depression, but he denied any suicidal ideation. Psychiatry was consulted and recommended starting cymbalta. 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 Trula Slade  -continue aggrenox  -pt refuses warfarin  -pain control-->morphine PCA started 10/09/13 with improving pain relief  -cathartics  -CIR has been consulted  -pt seen with Dr. Trula Slade at beside--L-groin bleed at old fistulous tract site-->scant amount--continue to monitor, pressure dressing for now  -Hemoglobin remained stable  Diabetes mellitus type 2  -09/06/2013 Hemoglobin of 7.7  -Half home dose of Levemir  -NovoLog sliding scale  -CBGs controlled  Tobacco abuse  -Tobacco cessation discussed  -Declines nicotine patch  Hypertension  -Restart lisinopril  Chronic pain/uncontrolled after sugery  -Restart OxyContin 20 mg every 12 hours  -OxyIR 5 mg every 4 hours when necessary  pain  -morphine PCA as above-->pain better controlled  -Decrease Toradol 15 mg due to the patient's age  Normocytic anemia  -Likely due to chronic disease  -Check iron studies in the morning  -Monitor CBC--hemoglobin stable  Hx of rectal cancer  -oncologist is Dr. Alen Blew  Situational Depression  -continue to monitor  -denies suicidal ideation  -consulted psychiatry-->start cymbalta  Severe Protein Calorie Malnutrition  -nutritional supplementation  Constipation -Continue Colace and Senokot -Add bisacodyl  Family Communication: Pt at beside  Disposition Plan: CIR when medically stable        Procedures/Studies:  No results found.      Subjective: Patient is in better spirits today. He complains of constipation. He also complaints of itching from the morphine. States that the pain is better controlled with the morphine PCA. Denies fevers, chills, chest shortness breath, nausea, vomiting, diarrhea, abdominal pain.  Objective: Filed Vitals:   10/11/13 1008 10/11/13 1040 10/11/13 1301 10/11/13 1600  BP:  149/70 157/52   Pulse:  88 75   Temp:  98.4 F (36.9 C) 99.1 F (37.3 C)   TempSrc:  Oral Oral   Resp: 10 8 10 7   Height:      Weight:      SpO2: 97% 96% 98% 96%    Intake/Output Summary (Last 24 hours) at 10/11/13 1830 Last data filed at 10/11/13 1400  Gross per 24 hour  Intake    360 ml  Output    350 ml  Net     10 ml   Weight change:  Exam:   General:  Pt is alert, follows commands appropriately, not in acute distress  HEENT: No icterus, No thrush,  [place]/AT  Cardiovascular: RRR, S1/S2, no rubs, no gallops  Respiratory: CTA bilaterally, no wheezing, no crackles, no rhonchi  Abdomen: Soft/+BS, non tender, non distended, no guarding Extremities: Left groin--old fistulous tract site with scant amount of bleeding without any crepitance or ecchymosis. Left AKA stump without active bleeding.   Data Reviewed: Basic Metabolic Panel:  Recent  Labs Lab 10/07/13 1748 10/08/13 0430 10/09/13 0500 10/10/13 0539 10/11/13 0554  NA 137 139 136* 135* 137  K 4.6 4.4 4.1 4.1 4.3  CL 99 101 96 95* 97  CO2 22 22 22 23 23   GLUCOSE 116* 118* 109* 151* 136*  BUN 11 9 7 13 14   CREATININE 0.94 0.82 0.77 0.90 0.92  CALCIUM 9.4 9.2 8.9 9.3 9.2   Liver Function Tests:  Recent Labs Lab 10/08/13 0430  AST 16  ALT 8  ALKPHOS 82  BILITOT 0.4  PROT 6.9  ALBUMIN 3.1*   No results found for this basename: LIPASE, AMYLASE,  in the last 168 hours No results found for this basename: AMMONIA,  in the last 168 hours CBC:  Recent Labs Lab 10/07/13 1748 10/08/13 0430 10/09/13 0500 10/10/13 0539 10/11/13 0554  WBC 7.7 7.4 9.6 9.6 9.6  NEUTROABS 5.5 5.1  --   --   --   HGB 9.8* 9.9* 9.1* 10.1* 9.1*  HCT 32.3* 33.1* 30.2* 32.9* 30.1*  MCV 82.4 83.0 83.2 83.3 84.1  PLT 284 287 285 294 283   Cardiac Enzymes: No results found for this basename: CKTOTAL, CKMB, CKMBINDEX, TROPONINI,  in the last 168 hours BNP: No components found with this basename: POCBNP,  CBG:  Recent Labs Lab 10/10/13 2106 10/11/13 0006 10/11/13 0758 10/11/13 1155 10/11/13 1615  GLUCAP 138* 149* 142* 167* 102*    Recent Results (from the past 240 hour(s))  SURGICAL PCR SCREEN     Status: None   Collection Time    10/08/13 12:12 AM      Result Value Ref Range Status   MRSA, PCR NEGATIVE  NEGATIVE Final   Staphylococcus aureus NEGATIVE  NEGATIVE Final   Comment:            The Xpert SA Assay (FDA     approved for NASAL specimens     in patients over 72 years of age),     is one component of     a comprehensive surveillance     program.  Test performance has     been validated by [employer] for patients greater     than or equal to 67 year old.     It is not intended     to diagnose infection nor to     guide or monitor treatment.     Scheduled Meds: . dipyridamole-aspirin  1 capsule Oral BID  . docusate sodium  100 mg Oral BID  .  DULoxetine  30 mg Oral Daily  . enoxaparin (LOVENOX) injection  40 mg Subcutaneous Q24H  . feeding supplement (GLUCERNA SHAKE)  237 mL Oral Daily  . insulin aspart  0-9 Units Subcutaneous TID WC  . insulin glargine  5 Units Subcutaneous QHS  . ketorolac  15 mg Intravenous 4 times per day  . lisinopril  2.5 mg Oral Daily  . morphine   Intravenous 6 times per day  . multivitamin with minerals  1 tablet Oral Daily  . OxyCODONE  20 mg Oral Q12H  . pantoprazole  40 mg Oral Daily  . senna  2 tablet Oral QHS  . traZODone  25 mg Oral QHS   Continuous Infusions: . lactated ringers 50 mL/hr at 10/09/13 1836     Zahria Ding, DO  [facility] Pager [phone]  If 7PM-7AM, please contact night-coverage [email or web address] Password [email or web address] 10/11/2013, 6:30 PM   LOS: 4 days

## 2013-10-12 ENCOUNTER — Ambulatory Visit (INDEPENDENT_AMBULATORY_CARE_PROVIDER_SITE_OTHER): Payer: Medicare Other | Admitting: Surgery

## 2013-10-12 ENCOUNTER — Encounter (HOSPITAL_COMMUNITY): Payer: Self-pay | Admitting: Surgery

## 2013-10-12 ENCOUNTER — Telehealth: Payer: Self-pay | Admitting: Surgery

## 2013-10-12 LAB — BASIC METABOLIC PANEL
BUN: 13 mg/dL (ref 6–23)
CO2: 25 meq/L (ref 19–32)
Calcium: 8.9 mg/dL (ref 8.4–10.5)
Chloride: 96 mEq/L (ref 96–112)
Creatinine, Ser: 0.86 mg/dL (ref 0.50–1.35)
GFR calc Af Amer: >90 mL/min (ref >90)
GFR, EST NON AFRICAN AMERICAN: 86 mL/min — AB (ref >90)
Glucose, Bld: 166 mg/dL — ABNORMAL HIGH (ref 70–99)
Potassium: 4.6 mEq/L (ref 3.7–5.3)
SODIUM: 136 meq/L — AB (ref 137–147)

## 2013-10-12 LAB — GLUCOSE, CAPILLARY
GLUCOSE-CAPILLARY: 128 mg/dL — AB (ref 70–99)
GLUCOSE-CAPILLARY: 139 mg/dL — AB (ref 70–99)
Glucose-Capillary: 132 mg/dL — ABNORMAL HIGH (ref 70–99)
Glucose-Capillary: 116 mg/dL — ABNORMAL HIGH (ref 70–99)
Glucose-Capillary: 163 mg/dL — ABNORMAL HIGH (ref 70–99)

## 2013-10-12 MED ORDER — OXYCODONE HCL ER 10 MG PO T12A
40.0000 mg | EXTENDED_RELEASE_TABLET | Freq: Two times a day (BID) | ORAL | Status: DC
  Administered 2013-10-12 – 2013-10-13 (×2): 40 mg via ORAL
  Filled 2013-10-12 (×2): qty 4

## 2013-10-12 MED ORDER — SODIUM CHLORIDE 0.9 % IV SOLN
125.0000 mg | Freq: Once | INTRAVENOUS | Status: AC
  Administered 2013-10-12: 125 mg via INTRAVENOUS
  Filled 2013-10-12: qty 10

## 2013-10-12 MED ORDER — OXYCODONE HCL ER 10 MG PO T12A
20.0000 mg | EXTENDED_RELEASE_TABLET | Freq: Once | ORAL | Status: AC
  Administered 2013-10-12: 20 mg via ORAL
  Filled 2013-10-12: qty 2

## 2013-10-12 MED ORDER — MAGNESIUM CITRATE PO SOLN
1.0000 | Freq: Once | ORAL | Status: AC
  Administered 2013-10-12: 1 via ORAL
  Filled 2013-10-12: qty 296

## 2013-10-12 NOTE — Telephone Encounter (Addendum)
Message copied by Gena Fray on Mon Oct 12, 2013  1:37 PM ------      Message from: Peter Minium K      Created: Mon Oct 12, 2013  9:54 AM      Regarding: Schedule                   ----- Message -----         From: Sherrye Payor, RN         Sent: 10/12/2013   9:41 AM           To: Mena Goes, CMA                        ----- Message -----         From: Gabriel Earing, PA-C         Sent: 10/12/2013   8:42 AM           To: [facility] Charge Pool            S/p left AKA 10/08/13.  F/u with Dr. Trula Slade in 4 weeks.            Thanks ------  10/12/13: unable to reach pt on home #, not disconnected, but calls can not be completed- no answering service. Mailed letter to home address. dpm

## 2013-10-12 NOTE — Progress Notes (Signed)
Rehab admissions - Evaluated for possible admission.  I met with patient.  He would like to come to inpatient rehab rather than go to a SNF.  He will need to be off PCA and participating better with therapies.  I encouraged him to try to sit up in chair today.  I will follow progress and plan inpatient rehab once medically ready and participating.  Call me for questions.  #[phone]

## 2013-10-12 NOTE — Progress Notes (Addendum)
  [facility] Progress Note  10/12/2013 8:38 AM POD 4  Subjective:  No complaints-not much to say this morning   Filed Vitals:   10/12/13 0750  BP:   Pulse:   Temp:   Resp: 8    Physical Exam: Incisions:  C/d/i with staples in tact.   CBC    Component Value Date/Time   WBC 9.6 10/11/2013 0554   WBC 7.1 06/05/2013 1010   RBC 3.58* 10/11/2013 0554   RBC 4.23 06/05/2013 1010   RBC 3.54* 01/26/2012 0455   HGB 9.1* 10/11/2013 0554   HGB 9.3* 06/05/2013 1010   HCT 30.1* 10/11/2013 0554   HCT 30.8* 06/05/2013 1010   PLT 283 10/11/2013 0554   PLT 324 06/05/2013 1010   MCV 84.1 10/11/2013 0554   MCV 72.9* 06/05/2013 1010   MCH 25.4* 10/11/2013 0554   MCH 22.0* 06/05/2013 1010   MCHC 30.2 10/11/2013 0554   MCHC 30.2* 06/05/2013 1010   RDW 23.2* 10/11/2013 0554   RDW 16.9* 06/05/2013 1010   LYMPHSABS 1.5 10/08/2013 0430   LYMPHSABS 1.6 06/05/2013 1010   MONOABS 0.7 10/08/2013 0430   MONOABS 0.5 06/05/2013 1010   EOSABS 0.1 10/08/2013 0430   EOSABS 0.1 06/05/2013 1010   BASOSABS 0.0 10/08/2013 0430   BASOSABS 0.1 06/05/2013 1010    BMET    Component Value Date/Time   NA 136* 10/12/2013 0456   NA 137 06/05/2013 1010   NA 147* 10/12/2011 0959   K 4.6 10/12/2013 0456   K 5.4* 06/05/2013 1010   K 5.2* 10/12/2011 0959   CL 96 10/12/2013 0456   CL 102 05/19/2012 1427   CL 102 10/12/2011 0959   CO2 25 10/12/2013 0456   CO2 23 06/05/2013 1010   CO2 29 10/12/2011 0959   GLUCOSE 166* 10/12/2013 0456   GLUCOSE 239* 06/05/2013 1010   GLUCOSE 205* 05/19/2012 1427   GLUCOSE 244* 10/12/2011 0959   BUN 13 10/12/2013 0456   BUN 19.5 06/05/2013 1010   BUN 13 10/12/2011 0959   CREATININE 0.86 10/12/2013 0456   CREATININE 1.4* 06/05/2013 1010   CREATININE 1.0 10/12/2011 0959   CALCIUM 8.9 10/12/2013 0456   CALCIUM 10.1 06/05/2013 1010   CALCIUM 9.0 10/12/2011 0959   GFRNONAA 86* 10/12/2013 0456   GFRAA >90 10/12/2013 0456    INR    Component Value Date/Time   INR 1.06 10/07/2013 1748     Intake/Output Summary  (Last 24 hours) at 10/12/13 0838 Last data filed at 10/12/13 0305  Gross per 24 hour  Intake    360 ml  Output    750 ml  Net   -390 ml     Assessment/Plan:  68 y.o. male is s/p left above knee amputation  POD 4  -pt doing well this am -states pain is improved -stump is viable -will order retention sock -to CIR when medically ready -appreciate psych consult-pt started on Cymbalta   Leontine Locket, PA-C [facility] [phone] 10/12/2013 8:38 AM  I agree with the above.  The patient was resting comfortably this morning and I elected not to wake him up.  I was able to evaluate his left above-knee amputation stump which continues to look healthy.  Annamarie Major

## 2013-10-12 NOTE — Consult Note (Signed)
[facility] Consult   Reason for Consult:  Depression and s/p surgery (Left AKA) Referring Physician:  Dr. Rosezena Sensor is an 68 y.o. male. Total Time spent with patient: 45 minutes  Assessment: AXIS I:  Mood Disorder NOS AXIS II:  Deferred AXIS III:   Past Medical History  Diagnosis Date  . GERD (gastroesophageal reflux disease)   . Hyperlipidemia   . Hypertension   . Peripheral vascular disease   . Internal and external bleeding hemorrhoids   . History of gout     left great toe  . Depression   . Numbness in right leg     Hx: of diabetes  . Coronary artery disease     RCA occlusion with good collaterals, o/w no obs CAD 12/2011  . Pneumonia ~ 1977  . Type II diabetes mellitus   . History of blood transfusion 12/2011; 12/2012    "before OR; after OR" (01/22/2013)  . Stroke 2008    "left side weak; unable to move left hand still" (01/22/2013)  . Rectal cancer dx'd 08/2007    S/P chemo, radiation, biopsies  . Metastases to the liver dx'd 08/2010   AXIS IV:  other psychosocial or environmental problems, problems related to social environment and problems with primary support group AXIS V:  41-50 serious symptoms  Plan: continue Cymbalta 30 mg PO QD  Case discussed with Dr. Carles Collet No evidence of imminent risk to self or others at present.   Patient does not meet criteria for psychiatric inpatient admission. Discussed crisis plan, support from social network, calling 911, coming to the Emergency Department, and calling Suicide Hotline. no change of medication made today Appreciate psychiatric consultation Please contact [phone] if needs further assistance  Subjective:   EISA NECAISE is a 68 y.o. male patient admitted with s/p Left AKA.  HPI: Patient is seen, chart reviewed and case discussed with Dr. Carles Collet and patient wife who was at next to the bed. Patient has been struglinging with severe symptoms of depression, sad, tearful, anxious and worried  about his current medical situation. He has satisfied with his pain control with medication and also sleeping with the help of current medications. He has no previous history of mental illness or psychiatric hospitalizations. Patient has denied psychosis and suicidal or homicidal ideations. Patient consented for medication trial after brief discussion about risk and side effects. Patient family stated that he has multiple medical problems including anal fissure and constipation.   Interval History: patient has been tolerating his antidepressant medication and has no reported adverse effects. He has been feeling better as per his family. Reportedly he has been feeling hot and itching, Will continue his current medication without changes.  Medical histroy: 68 y.o. male who was recently admitted for ischemic limb and had undergone left sided thrombectomy and left femoral popliteal bypass graft with patch angioplasty and fasciotomy (May 2015) with wound VAC was eventually discharged to rehabilitation. The pt was discharged home. He has been at home last 10 days and started experiencing worsening pain of his left lower extremity on the day of admission. In the ER patient was not having any palpable pulses down below the knee and his extremity was cool. Patient has previously refused any anticoagulants for his PVD. As a result, he was placed on Aggrenox. Vascular surgeon, Dr. Kellie Simmering was consulted. He recommeded amputation. 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 Postoperatively, the patient had uncontrolled pain and was started on a  morphine PCA as well as Toradol. He also suffered from situational depression, but he denied any suicidal ideation. Psychiatry was consulted.  HPI Elements:   Location:  depression. Quality:  poor. Severity:  acute and severe. Timing:  status post left AKA and multiple medical problems.  Past Psychiatric History: Past Medical History  Diagnosis Date  . GERD  (gastroesophageal reflux disease)   . Hyperlipidemia   . Hypertension   . Peripheral vascular disease   . Internal and external bleeding hemorrhoids   . History of gout     left great toe  . Depression   . Numbness in right leg     Hx: of diabetes  . Coronary artery disease     RCA occlusion with good collaterals, o/w no obs CAD 12/2011  . Pneumonia ~ 1977  . Type II diabetes mellitus   . History of blood transfusion 12/2011; 12/2012    "before OR; after OR" (01/22/2013)  . Stroke 2008    "left side weak; unable to move left hand still" (01/22/2013)  . Rectal cancer dx'd 08/2007    S/P chemo, radiation, biopsies  . 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 His smoking use included Cigarettes. He has a 5.7 pack-year smoking history. He has never used smokeless tobacco. He reports that he does not drink alcohol or use illicit drugs. Family History  Problem Relation Age of Onset  . Cancer - Other Sister      Living Arrangements: Spouse/significant other   Abuse/Neglect [facility]) Physical Abuse: Denies Verbal Abuse: Denies Sexual Abuse: Denies Allergies:   Allergies  Allergen Reactions  . Other     Became hypotensive. Pt states allergic to "muscle relaxer" but unsure of name.  . Tape Hives and Itching    medfix non woven tape    ACT Assessment Complete:  NO Objective: Blood pressure 151/66, pulse 86, temperature 97.5 F (36.4 C), temperature source Oral, resp. rate 8, height $RemoveB'5\' 9"'sZzPsIoB$  (1.753 m), weight 75.297 kg (166 lb), SpO2 95.00%.Body mass index is 24.5 kg/(m^2). Results for orders placed during the hospital encounter of 10/07/13 (from the past 72 hour(s))  GLUCOSE, CAPILLARY     Status: None   Collection Time    10/09/13  4:05 PM      Result Value Ref Range   Glucose-Capillary 98  70 - 99 mg/dL  GLUCOSE, CAPILLARY     Status: Abnormal   Collection Time    10/09/13  7:43 PM      Result Value Ref Range   Glucose-Capillary 103 (*) 70 - 99 mg/dL    Comment 1 Notify RN    GLUCOSE, CAPILLARY     Status: Abnormal   Collection Time    10/10/13 12:38 AM      Result Value Ref Range   Glucose-Capillary 163 (*) 70 - 99 mg/dL   Comment 1 Notify RN    GLUCOSE, CAPILLARY     Status: Abnormal   Collection Time    10/10/13  4:51 AM      Result Value Ref Range   Glucose-Capillary 120 (*) 70 - 99 mg/dL   Comment 1 Notify RN    BASIC METABOLIC PANEL     Status: Abnormal   Collection Time    10/10/13  5:39 AM      Result Value Ref Range   Sodium 135 (*) 137 - 147 mEq/L   Potassium 4.1  3.7 - 5.3 mEq/L   Chloride 95 (*)  96 - 112 mEq/L   CO2 23  19 - 32 mEq/L   Glucose, Bld 151 (*) 70 - 99 mg/dL   BUN 13  6 - 23 mg/dL   Creatinine, Ser 0.90  0.50 - 1.35 mg/dL   Calcium 9.3  8.4 - 10.5 mg/dL   GFR calc non Af Amer 85 (*) >90 mL/min   GFR calc Af Amer >90  >90 mL/min   Comment: (NOTE)     The eGFR has been calculated using the CKD EPI equation.     This calculation has not been validated in all clinical situations.     eGFR's persistently <90 mL/min signify possible Chronic Kidney     Disease.  CBC     Status: Abnormal   Collection Time    10/10/13  5:39 AM      Result Value Ref Range   WBC 9.6  4.0 - 10.5 K/uL   RBC 3.95 (*) 4.22 - 5.81 MIL/uL   Hemoglobin 10.1 (*) 13.0 - 17.0 g/dL   HCT 32.9 (*) 39.0 - 52.0 %   MCV 83.3  78.0 - 100.0 fL   MCH 25.6 (*) 26.0 - 34.0 pg   MCHC 30.7  30.0 - 36.0 g/dL   RDW 23.1 (*) 11.5 - 15.5 %   Platelets 294  150 - 400 K/uL  GLUCOSE, CAPILLARY     Status: Abnormal   Collection Time    10/10/13  7:42 AM      Result Value Ref Range   Glucose-Capillary 182 (*) 70 - 99 mg/dL  GLUCOSE, CAPILLARY     Status: Abnormal   Collection Time    10/10/13 11:47 AM      Result Value Ref Range   Glucose-Capillary 124 (*) 70 - 99 mg/dL  GLUCOSE, CAPILLARY     Status: Abnormal   Collection Time    10/10/13  3:56 PM      Result Value Ref Range   Glucose-Capillary 115 (*) 70 - 99 mg/dL  GLUCOSE, CAPILLARY      Status: Abnormal   Collection Time    10/10/13  9:06 PM      Result Value Ref Range   Glucose-Capillary 138 (*) 70 - 99 mg/dL   Comment 1 Notify RN    GLUCOSE, CAPILLARY     Status: Abnormal   Collection Time    10/11/13 12:06 AM      Result Value Ref Range   Glucose-Capillary 149 (*) 70 - 99 mg/dL   Comment 1 Notify RN    CBC     Status: Abnormal   Collection Time    10/11/13  5:54 AM      Result Value Ref Range   WBC 9.6  4.0 - 10.5 K/uL   RBC 3.58 (*) 4.22 - 5.81 MIL/uL   Hemoglobin 9.1 (*) 13.0 - 17.0 g/dL   HCT 30.1 (*) 39.0 - 52.0 %   MCV 84.1  78.0 - 100.0 fL   MCH 25.4 (*) 26.0 - 34.0 pg   MCHC 30.2  30.0 - 36.0 g/dL   RDW 23.2 (*) 11.5 - 15.5 %   Platelets 283  150 - 400 K/uL  BASIC METABOLIC PANEL     Status: Abnormal   Collection Time    10/11/13  5:54 AM      Result Value Ref Range   Sodium 137  137 - 147 mEq/L   Potassium 4.3  3.7 - 5.3 mEq/L   Chloride 97  96 - 112  mEq/L   CO2 23  19 - 32 mEq/L   Glucose, Bld 136 (*) 70 - 99 mg/dL   BUN 14  6 - 23 mg/dL   Creatinine, Ser 0.92  0.50 - 1.35 mg/dL   Calcium 9.2  8.4 - 10.5 mg/dL   GFR calc non Af Amer 84 (*) >90 mL/min   GFR calc Af Amer >90  >90 mL/min   Comment: (NOTE)     The eGFR has been calculated using the CKD EPI equation.     This calculation has not been validated in all clinical situations.     eGFR's persistently <90 mL/min signify possible Chronic Kidney     Disease.  IRON AND TIBC     Status: Abnormal   Collection Time    10/11/13  5:54 AM      Result Value Ref Range   Iron 11 (*) 42 - 135 ug/dL   TIBC 281  215 - 435 ug/dL   Saturation Ratios 4 (*) 20 - 55 %   UIBC 270  125 - 400 ug/dL   Comment: Performed at [place]     Status: None   Collection Time    10/11/13  5:54 AM      Result Value Ref Range   Ferritin 36  22 - 322 ng/mL   Comment: Performed at [place], CAPILLARY     Status: Abnormal   Collection Time    10/11/13  7:58 AM       Result Value Ref Range   Glucose-Capillary 142 (*) 70 - 99 mg/dL  GLUCOSE, CAPILLARY     Status: Abnormal   Collection Time    10/11/13 11:55 AM      Result Value Ref Range   Glucose-Capillary 167 (*) 70 - 99 mg/dL  GLUCOSE, CAPILLARY     Status: Abnormal   Collection Time    10/11/13  4:15 PM      Result Value Ref Range   Glucose-Capillary 102 (*) 70 - 99 mg/dL  GLUCOSE, CAPILLARY     Status: Abnormal   Collection Time    10/11/13  9:49 PM      Result Value Ref Range   Glucose-Capillary 131 (*) 70 - 99 mg/dL  BASIC METABOLIC PANEL     Status: Abnormal   Collection Time    10/12/13  4:56 AM      Result Value Ref Range   Sodium 136 (*) 137 - 147 mEq/L   Potassium 4.6  3.7 - 5.3 mEq/L   Chloride 96  96 - 112 mEq/L   CO2 25  19 - 32 mEq/L   Glucose, Bld 166 (*) 70 - 99 mg/dL   BUN 13  6 - 23 mg/dL   Creatinine, Ser 0.86  0.50 - 1.35 mg/dL   Calcium 8.9  8.4 - 10.5 mg/dL   GFR calc non Af Amer 86 (*) >90 mL/min   GFR calc Af Amer >90  >90 mL/min   Comment: (NOTE)     The eGFR has been calculated using the CKD EPI equation.     This calculation has not been validated in all clinical situations.     eGFR's persistently <90 mL/min signify possible Chronic Kidney     Disease.  GLUCOSE, CAPILLARY     Status: Abnormal   Collection Time    10/12/13  7:37 AM      Result Value Ref Range   Glucose-Capillary 139 (*) 70 -  99 mg/dL  GLUCOSE, CAPILLARY     Status: Abnormal   Collection Time    10/12/13 11:48 AM      Result Value Ref Range   Glucose-Capillary 132 (*) 70 - 99 mg/dL   Labs are reviewed and are pertinent for as above.  Current Facility-Administered Medications  Medication Dose Route Frequency Provider Last Rate Last Dose  . acetaminophen (TYLENOL) tablet 650 mg  650 mg Oral Q6H PRN Rise Patience, MD       Or  . acetaminophen (TYLENOL) suppository 650 mg  650 mg Rectal Q6H PRN Rise Patience, MD      . alum & mag hydroxide-simeth (MAALOX/MYLANTA) 200-200-20  MG/5ML suspension 15-30 mL  15-30 mL Oral Q2H PRN Ulyses Amor, PA-C      . diphenhydrAMINE (BENADRYL) injection 12.5 mg  12.5 mg Intravenous Q6H PRN Serafina Mitchell, MD       Or  . diphenhydrAMINE (BENADRYL) 12.5 MG/5ML elixir 12.5 mg  12.5 mg Oral Q6H PRN Serafina Mitchell, MD   12.5 mg at 10/11/13 1820  . dipyridamole-aspirin (AGGRENOX) 200-25 MG per 12 hr capsule 1 capsule  1 capsule Oral BID Orson Eva, MD   1 capsule at 10/12/13 0950  . docusate sodium (COLACE) capsule 100 mg  100 mg Oral BID Orson Eva, MD   100 mg at 10/12/13 0949  . DULoxetine (CYMBALTA) DR capsule 30 mg  30 mg Oral Daily Durward Parcel, MD   30 mg at 10/12/13 0949  . enoxaparin (LOVENOX) injection 40 mg  40 mg Subcutaneous Q24H Ulyses Amor, PA-C   40 mg at 10/11/13 2151  . feeding supplement (GLUCERNA SHAKE) (GLUCERNA SHAKE) liquid 237 mL  237 mL Oral Daily Reanne [place] Pink, RD      . ferric gluconate (NULECIT) 125 mg in sodium chloride 0.9 % 100 mL IVPB  125 mg Intravenous Once Orson Eva, MD   125 mg at 10/12/13 1424  . guaiFENesin-dextromethorphan (ROBITUSSIN DM) 100-10 MG/5ML syrup 15 mL  15 mL Oral Q4H PRN Ulyses Amor, PA-C      . hydrALAZINE (APRESOLINE) injection 10 mg  10 mg Intravenous Q4H PRN Rise Patience, MD   10 mg at 10/10/13 0456  . insulin aspart (novoLOG) injection 0-9 Units  0-9 Units Subcutaneous TID WC Rise Patience, MD   1 Units at 10/12/13 1234  . insulin glargine (LANTUS) injection 5 Units  5 Units Subcutaneous QHS Rise Patience, MD   5 Units at 10/11/13 2154  . ketorolac (TORADOL) 30 MG/ML injection 15 mg  15 mg Intravenous 4 times per day Orson Eva, MD   15 mg at 10/12/13 1236  . labetalol (NORMODYNE,TRANDATE) injection 10 mg  10 mg Intravenous Q2H PRN Ulyses Amor, PA-C      . lactated ringers infusion   Intravenous Continuous Finis Bud, MD 50 mL/hr at 10/12/13 0509    . lisinopril (PRINIVIL,ZESTRIL) tablet 2.5 mg  2.5 mg Oral Daily Orson Eva, MD   2.5 mg  at 10/12/13 0949  . magnesium citrate solution 1 Bottle  1 Bottle Oral Once Shanon Brow Tat, MD      . magnesium sulfate IVPB 2 g 50 mL  2 g Intravenous Daily PRN Ulyses Amor, PA-C      . metoprolol (LOPRESSOR) injection 2-5 mg  2-5 mg Intravenous Q2H PRN Ulyses Amor, PA-C      . morphine 1 MG/ML PCA injection   Intravenous 6 times per  day Serafina Mitchell, MD      . morphine 2 MG/ML injection 2 mg  2 mg Intravenous Q2H PRN Hulen Shouts Rhyne, PA-C   2 mg at 10/09/13 1053  . multivitamin with minerals tablet 1 tablet  1 tablet Oral Daily Baird Lyons, RD   1 tablet at 10/12/13 0950  . naloxone [facility]) injection 0.4 mg  0.4 mg Intravenous PRN Serafina Mitchell, MD       And  . sodium chloride 0.9 % injection 9 mL  9 mL Intravenous PRN Serafina Mitchell, MD      . ondansetron [facility]) tablet 4 mg  4 mg Oral Q6H PRN Rise Patience, MD       Or  . ondansetron [facility]) injection 4 mg  4 mg Intravenous Q6H PRN Rise Patience, MD      . oxyCODONE (Oxy IR/ROXICODONE) immediate release tablet 5 mg  5 mg Oral Q4H PRN Orson Eva, MD   5 mg at 10/09/13 0426  . OxyCODONE (OXYCONTIN) 12 hr tablet 40 mg  40 mg Oral Q12H Orson Eva, MD      . oxyCODONE-acetaminophen (PERCOCET/ROXICET) 5-325 MG per tablet 1-2 tablet  1-2 tablet Oral Q4H PRN Ulyses Amor, PA-C   2 tablet at 10/09/13 1150  . pantoprazole (PROTONIX) EC tablet 40 mg  40 mg Oral Daily Ulyses Amor, PA-C   40 mg at 10/12/13 0950  . phenol (CHLORASEPTIC) mouth spray 1 spray  1 spray Mouth/Throat PRN Ulyses Amor, PA-C      . potassium chloride SA (K-DUR,KLOR-CON) CR tablet 20-40 mEq  20-40 mEq Oral Daily PRN Ulyses Amor, PA-C      . senna (SENOKOT) tablet 17.2 mg  2 tablet Oral QHS Orson Eva, MD   17.2 mg at 10/11/13 2151  . sodium chloride 0.9 % injection 10-40 mL  10-40 mL Intracatheter PRN Orson Eva, MD   10 mL at 10/12/13 0456  . traZODone (DESYREL) tablet 25 mg  25 mg Oral QHS Orson Eva, MD   25 mg at 10/11/13 2152    Psychiatric  Specialty Exam: Physical Exam  ROS  Blood pressure 151/66, pulse 86, temperature 97.5 F (36.4 C), temperature source Oral, resp. rate 8, height $RemoveB'5\' 9"'dEFTYPVz$  (1.753 m), weight 75.297 kg (166 lb), SpO2 95.00%.Body mass index is 24.5 kg/(m^2).  General Appearance: Guarded  Eye Contact::  Good  Speech:  Clear and Coherent  Volume:  Decreased  Mood:  Depressed  Affect:  Tearful  Thought Process:  Coherent and Goal Directed  Orientation:  Full (Time, Place, and Person)  Thought Content:  WDL  Suicidal Thoughts:  No  Homicidal Thoughts:  No  Memory:  Immediate;   Fair  Judgement:  Good  Insight:  Good  Psychomotor Activity:  Decreased  Concentration:  Good  Recall:  Good  Fund of Knowledge:Good  Language: Good  Akathisia:  NA  Handed:  Right  AIMS (if indicated):     Assets:  Communication Skills Desire for [place] Talents/Skills Transportation  Sleep:      Musculoskeletal: Strength & Muscle Tone: within normal limits Gait & Station: unable to stand Patient leans: N/A  Treatment Plan Summary: Daily contact with patient to assess and evaluate symptoms and progress in treatment Medication management  Sherre Wooton,JANARDHAHA R. 10/12/2013 3:23 PM

## 2013-10-12 NOTE — Progress Notes (Addendum)
PROGRESS NOTE  Donald [name] EHU:[id number] DOB: Sep 07, 1945 DOA: 10/07/2013 PCP: Maggie Font, MD   Brief history  68 y.o. male who was recently admitted for ischemic limb and had undergone left sided thrombectomy and left femoral popliteal bypass graft with patch angioplasty and fasciotomy (May 2015) with wound VAC was eventually discharged to rehabilitation. The pt was discharged home. He has been at home last 10 days and started experiencing worsening pain of his left lower extremity on the day of admission. In the ER patient was not having any palpable pulses down below the knee and his extremity was cool. Patient has previously refused any anticoagulants for his PVD. As a result, he was placed on Aggrenox. Vascular surgeon, Dr. Kellie Simmering was consulted. He recommeded amputation. 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 Postoperatively, the patient had uncontrolled pain and was started on a morphine PCA as well as Toradol. He also suffered from situational depression, but he denied any suicidal ideation. Psychiatry was consulted and recommended starting cymbalta.  CIR evaluate the patient and recommended transitioning off PCA pump and increased participation with PT before except acceptance. 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 Trula Slade  -continue aggrenox  -pt refuses warfarin  -pain control-->morphine PCA started 10/09/13 with improving pain relief  -cathartics  -CIR has been consulted-->  recommended transitioning off PCA pump and increased participation with PT before except acceptance. -pt seen with Dr. Trula Slade at beside--L-groin bleed at old fistulous tract site-->scant amount--continue to monitor, pressure dressing for now  -Hemoglobin remained stable  -Increase OxyContin to 40 mg twice a day -plan to d/c PCA 10/13/13 if okay with vascular surgery Diabetes mellitus type 2    -09/06/2013 Hemoglobin of 7.7  -Half home dose of Levemir  -NovoLog sliding scale  -CBGs controlled  Tobacco abuse  -Tobacco cessation discussed  -Declines nicotine patch  Hypertension  -Restarted lisinopril  Chronic pain/uncontrolled after sugery  -Restart OxyContin 20 mg every 12 hours  -OxyIR 5 mg every 4 hours when necessary pain  -morphine PCA as above-->pain better controlled  -Decrease Toradol 15 mg due to the patient's age  Normocytic anemia/Iron deficiency  -Likely due to chronic disease  -Check iron studies in the morning  -Monitor CBC--hemoglobin stable  -give a dose nulecit Hx of rectal cancer  -oncologist is Dr. Alen Blew  Situational Depression  -continue to monitor  -denies suicidal ideation  -consulted psychiatry-->start cymbalta  Severe Protein Calorie Malnutrition  -nutritional supplementation  Constipation  -Continue Colace and Senokot  -Add bisacodyl  Family Communication: wfie at beside  Disposition Plan: CIR when medically stable     Subjective: Patient states that overall pain is improving. Denies any fevers, chills, chest pain or shortness breath, nausea, vomiting, diarrhea, dysuria. Complains of constipation.  Objective: Filed Vitals:   10/12/13 0555 10/12/13 0750 10/12/13 0900 10/12/13 1202  BP: 133/53  123/98   Pulse: 73  76   Temp: 98.6 F (37 C)  97.6 F (36.4 C)   TempSrc:   Oral   Resp: 15 8 15 7   Height:      Weight:      SpO2: 98% 99% 99% 97%    Intake/Output Summary (Last 24 hours) at 10/12/13 1328 Last data filed at 10/12/13 1302  Gross per 24 hour  Intake    360 ml  Output    950 ml  Net   -590 ml  Weight change:  Exam:   General:  Pt is alert, follows commands appropriately, not in acute distress  HEENT: No icterus, No thrush,  [place]/AT  Cardiovascular: RRR, S1/S2, no rubs, no gallops  Respiratory: Bibasilar crackles. No wheezing. Good air movement.  Abdomen: Soft/+BS, non tender, non distended, no  guarding  Extremities: R-AKA site without bleeding or ecchymosis,  R-groin--no active bleed  Data Reviewed: Basic Metabolic Panel:  Recent Labs Lab 10/08/13 0430 10/09/13 0500 10/10/13 0539 10/11/13 0554 10/12/13 0456  NA 139 136* 135* 137 136*  K 4.4 4.1 4.1 4.3 4.6  CL 101 96 95* 97 96  CO2 22 22 23 23 25   GLUCOSE 118* 109* 151* 136* 166*  BUN 9 7 13 14 13   CREATININE 0.82 0.77 0.90 0.92 0.86  CALCIUM 9.2 8.9 9.3 9.2 8.9   Liver Function Tests:  Recent Labs Lab 10/08/13 0430  AST 16  ALT 8  ALKPHOS 82  BILITOT 0.4  PROT 6.9  ALBUMIN 3.1*   No results found for this basename: LIPASE, AMYLASE,  in the last 168 hours No results found for this basename: AMMONIA,  in the last 168 hours CBC:  Recent Labs Lab 10/07/13 1748 10/08/13 0430 10/09/13 0500 10/10/13 0539 10/11/13 0554  WBC 7.7 7.4 9.6 9.6 9.6  NEUTROABS 5.5 5.1  --   --   --   HGB 9.8* 9.9* 9.1* 10.1* 9.1*  HCT 32.3* 33.1* 30.2* 32.9* 30.1*  MCV 82.4 83.0 83.2 83.3 84.1  PLT 284 287 285 294 283   Cardiac Enzymes: No results found for this basename: CKTOTAL, CKMB, CKMBINDEX, TROPONINI,  in the last 168 hours BNP: No components found with this basename: POCBNP,  CBG:  Recent Labs Lab 10/11/13 1155 10/11/13 1615 10/11/13 2149 10/12/13 0737 10/12/13 1148  GLUCAP 167* 102* 131* 139* 132*    Recent Results (from the past 240 hour(s))  SURGICAL PCR SCREEN     Status: None   Collection Time    10/08/13 12:12 AM      Result Value Ref Range Status   MRSA, PCR NEGATIVE  NEGATIVE Final   Staphylococcus aureus NEGATIVE  NEGATIVE Final   Comment:            The Xpert SA Assay (FDA     approved for NASAL specimens     in patients over 89 years of age),     is one component of     a comprehensive surveillance     program.  Test performance has     been validated by [employer] for patients greater     than or equal to 66 year old.     It is not intended     to diagnose infection nor to      guide or monitor treatment.     Scheduled Meds: . dipyridamole-aspirin  1 capsule Oral BID  . docusate sodium  100 mg Oral BID  . DULoxetine  30 mg Oral Daily  . enoxaparin (LOVENOX) injection  40 mg Subcutaneous Q24H  . feeding supplement (GLUCERNA SHAKE)  237 mL Oral Daily  . insulin aspart  0-9 Units Subcutaneous TID WC  . insulin glargine  5 Units Subcutaneous QHS  . ketorolac  15 mg Intravenous 4 times per day  . lisinopril  2.5 mg Oral Daily  . morphine   Intravenous 6 times per day  . multivitamin with minerals  1 tablet Oral Daily  . OxyCODONE  20 mg Oral  Once  . OxyCODONE  40 mg Oral Q12H  . pantoprazole  40 mg Oral Daily  . senna  2 tablet Oral QHS  . traZODone  25 mg Oral QHS   Continuous Infusions: . lactated ringers 50 mL/hr at 10/12/13 0509     Tarren Velardi, DO  [facility] Pager [phone]  If 7PM-7AM, please contact night-coverage [email or web address] Password [email or web address] 10/12/2013, 1:28 PM   LOS: 5 days

## 2013-10-12 NOTE — Progress Notes (Signed)
Physical Therapy Treatment Patient Details Name: Donald [name] MRN: [id number] DOB: April 14, 1946 Today's Date: 10/12/2013    History of Present Illness 68 y.o. male s/p Lt aka.Pt recently d/c from [place] in May 2015 for Lt femoral-popliteal bypass. 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    PT Comments    Although very anxious pt was able to progress well through exercise , sitting, standing and transfers  Follow Up Recommendations  CIR     Equipment Recommendations  Other (comment)    Recommendations for Other Services Rehab consult     Precautions / Restrictions Precautions Precautions: Fall    Mobility  Bed Mobility Overal bed mobility: Needs Assistance Bed Mobility: Sidelying to Sit     Supine to sit: Min assist     General bed mobility comments: Bridged to EOB with min assist; minimal assist to EOB  Transfers Overall transfer level: Needs assistance Equipment used:  (chair back) Transfers: Sit to/from [employer] Sit to Stand: Min assist;From elevated surface;+2 safety/equipment (times 3)   Squat pivot transfers: Min assist;+2 safety/equipment     General transfer comment: cues for sequencign and to calm anxiety; steady assist  Ambulation/Gait                 Stairs            Wheelchair Mobility    Modified Rankin (Stroke Patients Only)       Balance     Sitting balance-Leahy Scale: Fair Sitting balance - Comments: Much more calm with static sitting and scooting to EOB                            Cognition Arousal/Alertness: Awake/alert Behavior During Therapy: Anxious Overall Cognitive Status: Within Functional Limits for tasks assessed                      Exercises Amputee Exercises Hip Extension: AROM;10 reps;Supine;Left Hip ABduction/ADduction: AAROM;Left;10 reps;Supine Hip Flexion/Marching: AAROM;Left;10  reps;Supine    General Comments        Pertinent Vitals/Pain Pain with exercise     Home Living                      Prior Function            PT Goals (current goals can now be found in the care plan section) Acute Rehab PT Goals Patient Stated Goal: to speak with a suporrt group member PT Goal Formulation: With patient Time For Goal Achievement: 10/24/13 Potential to Achieve Goals: Good Progress towards PT goals: Progressing toward goals    Frequency  Min 3X/week    PT Plan Current plan remains appropriate    Co-evaluation             End of Session Equipment Utilized During Treatment: Oxygen Activity Tolerance: Patient tolerated treatment well;Other (comment) (anious through treatment) Patient left: in chair;with call bell/phone within reach     Time: 1115-1140 PT Time Calculation (min): 25 min  Charges:  $Therapeutic Exercise: 8-22 mins $Therapeutic Activity: 8-22 mins                    G Codes:      Tarique Loveall, Raine Elsass 10/12/2013, 12:36 PM 10/12/2013  Donnella Sham, PT [phone]  (pager)

## 2013-10-12 NOTE — Progress Notes (Signed)
OT Cancellation Note  Patient Details Name: Donald [name] MRN: [id number] DOB: 20-Jun-1945   Cancelled Treatment:    Reason Eval/Treat Not Completed: Fatigue/lethargy limiting ability to participate - attempted to see pt x 2.  First attempt, pt eating lunch.  Second attempt, pt had just returned to bed from recliner and is exhausted.  Will reattempt tomorrow  Darlina Rumpf Rupert, OTR/L [phone] 10/12/2013, 3:03 PM

## 2013-10-13 ENCOUNTER — Inpatient Hospital Stay (HOSPITAL_COMMUNITY)
Admission: RE | Admit: 2013-10-13 | Discharge: 2013-10-29 | DRG: 945 | Disposition: A | Discharge status: Other Acute Inpatient Hospital | Payer: Medicare Other | Source: Intra-hospital | Attending: Physical Medicine & Rehabilitation | Admitting: Physical Medicine & Rehabilitation

## 2013-10-13 DIAGNOSIS — G8110 Spastic hemiplegia affecting unspecified side: Secondary | ICD-10-CM

## 2013-10-13 DIAGNOSIS — Z5189 Encounter for other specified aftercare: Principal | ICD-10-CM

## 2013-10-13 DIAGNOSIS — S78119A Complete traumatic amputation at level between unspecified hip and knee, initial encounter: Secondary | ICD-10-CM

## 2013-10-13 DIAGNOSIS — L8992 Pressure ulcer of unspecified site, stage 2: Secondary | ICD-10-CM | POA: Diagnosis not present

## 2013-10-13 DIAGNOSIS — D509 Iron deficiency anemia, unspecified: Secondary | ICD-10-CM | POA: Diagnosis present

## 2013-10-13 DIAGNOSIS — E1159 Type 2 diabetes mellitus with other circulatory complications: Secondary | ICD-10-CM | POA: Diagnosis present

## 2013-10-13 DIAGNOSIS — I69354 Hemiplegia and hemiparesis following cerebral infarction affecting left non-dominant side: Secondary | ICD-10-CM

## 2013-10-13 DIAGNOSIS — Z860100 Personal history of colon polyps, unspecified: Secondary | ICD-10-CM

## 2013-10-13 DIAGNOSIS — Z89612 Acquired absence of left leg above knee: Secondary | ICD-10-CM

## 2013-10-13 DIAGNOSIS — R21 Rash and other nonspecific skin eruption: Secondary | ICD-10-CM | POA: Diagnosis not present

## 2013-10-13 DIAGNOSIS — Z794 Long term (current) use of insulin: Secondary | ICD-10-CM

## 2013-10-13 DIAGNOSIS — I69959 Hemiplegia and hemiparesis following unspecified cerebrovascular disease affecting unspecified side: Secondary | ICD-10-CM

## 2013-10-13 DIAGNOSIS — L89109 Pressure ulcer of unspecified part of back, unspecified stage: Secondary | ICD-10-CM | POA: Diagnosis not present

## 2013-10-13 DIAGNOSIS — E1149 Type 2 diabetes mellitus with other diabetic neurological complication: Secondary | ICD-10-CM | POA: Diagnosis present

## 2013-10-13 DIAGNOSIS — E785 Hyperlipidemia, unspecified: Secondary | ICD-10-CM | POA: Diagnosis present

## 2013-10-13 DIAGNOSIS — L98499 Non-pressure chronic ulcer of skin of other sites with unspecified severity: Secondary | ICD-10-CM

## 2013-10-13 DIAGNOSIS — K5909 Other constipation: Secondary | ICD-10-CM

## 2013-10-13 DIAGNOSIS — Z85048 Personal history of other malignant neoplasm of rectum, rectosigmoid junction, and anus: Secondary | ICD-10-CM

## 2013-10-13 DIAGNOSIS — F329 Major depressive disorder, single episode, unspecified: Secondary | ICD-10-CM | POA: Diagnosis present

## 2013-10-13 DIAGNOSIS — K5900 Constipation, unspecified: Secondary | ICD-10-CM | POA: Diagnosis present

## 2013-10-13 DIAGNOSIS — I739 Peripheral vascular disease, unspecified: Secondary | ICD-10-CM | POA: Diagnosis present

## 2013-10-13 DIAGNOSIS — Z87891 Personal history of nicotine dependence: Secondary | ICD-10-CM

## 2013-10-13 DIAGNOSIS — B369 Superficial mycosis, unspecified: Secondary | ICD-10-CM | POA: Diagnosis not present

## 2013-10-13 DIAGNOSIS — K219 Gastro-esophageal reflux disease without esophagitis: Secondary | ICD-10-CM | POA: Diagnosis present

## 2013-10-13 DIAGNOSIS — I10 Essential (primary) hypertension: Secondary | ICD-10-CM | POA: Diagnosis present

## 2013-10-13 DIAGNOSIS — Z8505 Personal history of malignant neoplasm of liver: Secondary | ICD-10-CM

## 2013-10-13 DIAGNOSIS — R269 Unspecified abnormalities of gait and mobility: Secondary | ICD-10-CM

## 2013-10-13 DIAGNOSIS — E119 Type 2 diabetes mellitus without complications: Secondary | ICD-10-CM

## 2013-10-13 DIAGNOSIS — Z89619 Acquired absence of unspecified leg above knee: Secondary | ICD-10-CM

## 2013-10-13 DIAGNOSIS — E0959 Drug or chemical induced diabetes mellitus with other circulatory complications: Secondary | ICD-10-CM

## 2013-10-13 DIAGNOSIS — Z8673 Personal history of transient ischemic attack (TIA), and cerebral infarction without residual deficits: Secondary | ICD-10-CM

## 2013-10-13 DIAGNOSIS — R52 Pain, unspecified: Secondary | ICD-10-CM | POA: Diagnosis present

## 2013-10-13 DIAGNOSIS — F3289 Other specified depressive episodes: Secondary | ICD-10-CM | POA: Diagnosis present

## 2013-10-13 DIAGNOSIS — E1142 Type 2 diabetes mellitus with diabetic polyneuropathy: Secondary | ICD-10-CM | POA: Diagnosis present

## 2013-10-13 LAB — GLUCOSE, CAPILLARY
GLUCOSE-CAPILLARY: 156 mg/dL — AB (ref 70–99)
GLUCOSE-CAPILLARY: 131 mg/dL — AB (ref 70–99)
GLUCOSE-CAPILLARY: 142 mg/dL — AB (ref 70–99)
GLUCOSE-CAPILLARY: 130 mg/dL — AB (ref 70–99)
Glucose-Capillary: 140 mg/dL — ABNORMAL HIGH (ref 70–99)
Glucose-Capillary: 134 mg/dL — ABNORMAL HIGH (ref 70–99)

## 2013-10-13 MED ORDER — LISINOPRIL 2.5 MG PO TABS
2.5000 mg | ORAL_TABLET | Freq: Every day | ORAL | Status: DC
  Administered 2013-10-14 – 2013-10-28 (×15): 2.5 mg via ORAL
  Filled 2013-10-13 (×18): qty 1

## 2013-10-13 MED ORDER — FLUCONAZOLE 100 MG PO TABS
100.0000 mg | ORAL_TABLET | Freq: Every day | ORAL | Status: DC
  Administered 2013-10-13 – 2013-10-25 (×13): 100 mg via ORAL
  Filled 2013-10-13 (×16): qty 1

## 2013-10-13 MED ORDER — ALUM & MAG HYDROXIDE-SIMETH 200-200-20 MG/5ML PO SUSP: 15.0000 mL | ORAL | Status: DC | PRN

## 2013-10-13 MED ORDER — OXYCODONE HCL 5 MG PO TABS
5.0000 mg | ORAL_TABLET | ORAL | Status: DC | PRN
  Administered 2013-10-14 – 2013-10-15 (×2): 10 mg via ORAL
  Filled 2013-10-13 (×5): qty 2

## 2013-10-13 MED ORDER — ALUM & MAG HYDROXIDE-SIMETH 200-200-20 MG/5ML PO SUSP: 30.0000 mL | ORAL | Status: DC | PRN

## 2013-10-13 MED ORDER — INSULIN GLARGINE 100 UNIT/ML ~~LOC~~ SOLN
5.0000 [IU] | Freq: Every day | SUBCUTANEOUS | Status: DC
  Administered 2013-10-13 – 2013-10-28 (×16): 5 [IU] via SUBCUTANEOUS
  Filled 2013-10-13 (×17): qty 0.05

## 2013-10-13 MED ORDER — TRAZODONE HCL 50 MG PO TABS
25.0000 mg | ORAL_TABLET | Freq: Every evening | ORAL | Status: DC | PRN
  Administered 2013-10-13 – 2013-10-25 (×3): 50 mg via ORAL
  Filled 2013-10-13 (×5): qty 1

## 2013-10-13 MED ORDER — TRAZODONE HCL 50 MG PO TABS
25.0000 mg | ORAL_TABLET | Freq: Every day | ORAL | Status: DC
  Administered 2013-10-14 – 2013-10-28 (×14): 25 mg via ORAL
  Filled 2013-10-13 (×5): qty 1
  Filled 2013-10-13: qty 0.5
  Filled 2013-10-13 (×7): qty 1

## 2013-10-13 MED ORDER — DULOXETINE HCL 30 MG PO CPEP
30.0000 mg | ORAL_CAPSULE | Freq: Every day | ORAL | Status: DC
  Administered 2013-10-14 – 2013-10-26 (×13): 30 mg via ORAL
  Filled 2013-10-13 (×14): qty 1

## 2013-10-13 MED ORDER — PROCHLORPERAZINE 25 MG RE SUPP
12.5000 mg | Freq: Four times a day (QID) | RECTAL | Status: DC | PRN
  Filled 2013-10-13: qty 1

## 2013-10-13 MED ORDER — KETOROLAC TROMETHAMINE 15 MG/ML IJ SOLN
15.0000 mg | Freq: Four times a day (QID) | INTRAMUSCULAR | Status: DC
  Administered 2013-10-13: 15 mg via INTRAVENOUS

## 2013-10-13 MED ORDER — ASPIRIN-DIPYRIDAMOLE ER 25-200 MG PO CP12
1.0000 | ORAL_CAPSULE | Freq: Two times a day (BID) | ORAL | Status: DC
  Administered 2013-10-13 – 2013-10-28 (×31): 1 via ORAL
  Filled 2013-10-13 (×35): qty 1

## 2013-10-13 MED ORDER — SENNA 8.6 MG PO TABS: 2.0000 | ORAL_TABLET | Freq: Every day | ORAL | Status: AC

## 2013-10-13 MED ORDER — GUAIFENESIN-DM 100-10 MG/5ML PO SYRP: 5.0000 mL | ORAL_SOLUTION | Freq: Four times a day (QID) | ORAL | Status: DC | PRN

## 2013-10-13 MED ORDER — METHOCARBAMOL 500 MG PO TABS
500.0000 mg | ORAL_TABLET | Freq: Four times a day (QID) | ORAL | Status: DC | PRN
  Administered 2013-10-17 – 2013-10-28 (×11): 500 mg via ORAL
  Filled 2013-10-13 (×12): qty 1

## 2013-10-13 MED ORDER — ALPRAZOLAM 0.5 MG PO TABS
0.5000 mg | ORAL_TABLET | Freq: Three times a day (TID) | ORAL | Status: DC | PRN
  Administered 2013-10-19 – 2013-10-23 (×4): 0.5 mg via ORAL
  Filled 2013-10-13 (×4): qty 1

## 2013-10-13 MED ORDER — NICOTINE 7 MG/24HR TD PT24
7.0000 mg | MEDICATED_PATCH | Freq: Every day | TRANSDERMAL | Status: DC
  Administered 2013-10-13 – 2013-10-28 (×17): 7 mg via TRANSDERMAL
  Filled 2013-10-13 (×19): qty 1

## 2013-10-13 MED ORDER — PROCHLORPERAZINE MALEATE 5 MG PO TABS
5.0000 mg | ORAL_TABLET | Freq: Four times a day (QID) | ORAL | Status: DC | PRN
  Filled 2013-10-13: qty 2

## 2013-10-13 MED ORDER — SODIUM CHLORIDE 0.9 % IV SOLN
125.0000 mg | Freq: Once | INTRAVENOUS | Status: AC
  Administered 2013-10-13: 125 mg via INTRAVENOUS
  Filled 2013-10-13: qty 10

## 2013-10-13 MED ORDER — ADULT MULTIVITAMIN W/MINERALS CH
1.0000 | ORAL_TABLET | Freq: Every day | ORAL | Status: DC
  Administered 2013-10-14 – 2013-10-28 (×15): 1 via ORAL
  Filled 2013-10-13 (×18): qty 1

## 2013-10-13 MED ORDER — FERROUS SULFATE 325 (65 FE) MG PO TABS: 325.0000 mg | ORAL_TABLET | Freq: Two times a day (BID) | ORAL | Status: DC

## 2013-10-13 MED ORDER — DSS 100 MG PO CAPS: 100.0000 mg | ORAL_CAPSULE | Freq: Two times a day (BID) | ORAL | Status: AC

## 2013-10-13 MED ORDER — FERROUS SULFATE 325 (65 FE) MG PO TABS: 325.0000 mg | ORAL_TABLET | Freq: Two times a day (BID) | ORAL | Status: AC

## 2013-10-13 MED ORDER — ACETAMINOPHEN 325 MG PO TABS
325.0000 mg | ORAL_TABLET | ORAL | Status: DC | PRN
  Administered 2013-10-22: 650 mg via ORAL
  Filled 2013-10-13: qty 2

## 2013-10-13 MED ORDER — DOCUSATE SODIUM 100 MG PO CAPS
100.0000 mg | ORAL_CAPSULE | Freq: Two times a day (BID) | ORAL | Status: DC
  Administered 2013-10-13 – 2013-10-27 (×25): 100 mg via ORAL
  Filled 2013-10-13 (×30): qty 1

## 2013-10-13 MED ORDER — OXYCODONE-ACETAMINOPHEN 5-325 MG PO TABS
1.0000 | ORAL_TABLET | ORAL | Status: DC | PRN
  Administered 2013-10-13 – 2013-10-16 (×7): 2 via ORAL
  Administered 2013-10-16: 1 via ORAL
  Administered 2013-10-17 – 2013-10-20 (×9): 2 via ORAL
  Administered 2013-10-21: 1 via ORAL
  Filled 2013-10-13 (×4): qty 2
  Filled 2013-10-13: qty 1
  Filled 2013-10-13 (×12): qty 2
  Filled 2013-10-13: qty 1

## 2013-10-13 MED ORDER — INSULIN ASPART 100 UNIT/ML ~~LOC~~ SOLN
0.0000 [IU] | Freq: Three times a day (TID) | SUBCUTANEOUS | Status: DC
  Administered 2013-10-13 – 2013-10-16 (×6): 1 [IU] via SUBCUTANEOUS
  Administered 2013-10-16: 2 [IU] via SUBCUTANEOUS
  Administered 2013-10-17 – 2013-10-19 (×6): 1 [IU] via SUBCUTANEOUS
  Administered 2013-10-19: 2 [IU] via SUBCUTANEOUS
  Administered 2013-10-19: 1 [IU] via SUBCUTANEOUS
  Administered 2013-10-20 (×2): 2 [IU] via SUBCUTANEOUS
  Administered 2013-10-20: 1 [IU] via SUBCUTANEOUS
  Administered 2013-10-21 – 2013-10-22 (×3): 2 [IU] via SUBCUTANEOUS
  Administered 2013-10-22: 1 [IU] via SUBCUTANEOUS
  Administered 2013-10-22: 2 [IU] via SUBCUTANEOUS
  Administered 2013-10-23: 1 [IU] via SUBCUTANEOUS
  Administered 2013-10-23 (×2): 2 [IU] via SUBCUTANEOUS
  Administered 2013-10-24: 5 [IU] via SUBCUTANEOUS
  Administered 2013-10-24: 1 [IU] via SUBCUTANEOUS
  Administered 2013-10-24: 2 [IU] via SUBCUTANEOUS
  Administered 2013-10-25 (×2): 3 [IU] via SUBCUTANEOUS
  Administered 2013-10-25: 1 [IU] via SUBCUTANEOUS
  Administered 2013-10-28 (×2): 2 [IU] via SUBCUTANEOUS
  Administered 2013-10-29: 3 [IU] via SUBCUTANEOUS

## 2013-10-13 MED ORDER — OXYCODONE HCL ER 40 MG PO T12A
40.0000 mg | EXTENDED_RELEASE_TABLET | Freq: Two times a day (BID) | ORAL | Status: AC
Start: 2013-10-13 — End: ?

## 2013-10-13 MED ORDER — PROCHLORPERAZINE EDISYLATE 5 MG/ML IJ SOLN
5.0000 mg | Freq: Four times a day (QID) | INTRAMUSCULAR | Status: DC | PRN
  Filled 2013-10-13: qty 2

## 2013-10-13 MED ORDER — GLUCERNA SHAKE PO LIQD: 237.0000 mL | Freq: Every day | ORAL | Status: AC

## 2013-10-13 MED ORDER — BISACODYL 10 MG RE SUPP
10.0000 mg | Freq: Every day | RECTAL | Status: DC | PRN
  Administered 2013-10-14: 10 mg via RECTAL
  Filled 2013-10-13: qty 1

## 2013-10-13 MED ORDER — GLUCERNA SHAKE PO LIQD
237.0000 mL | Freq: Every day | ORAL | Status: DC
Start: 2013-10-14 — End: 2013-10-14

## 2013-10-13 MED ORDER — ENOXAPARIN SODIUM 40 MG/0.4ML ~~LOC~~ SOLN
40.0000 mg | SUBCUTANEOUS | Status: DC
  Administered 2013-10-13 – 2013-10-28 (×16): 40 mg via SUBCUTANEOUS
  Filled 2013-10-13 (×18): qty 0.4

## 2013-10-13 MED ORDER — FLEET ENEMA 7-19 GM/118ML RE ENEM: 1.0000 | ENEMA | Freq: Every day | RECTAL | Status: DC | PRN

## 2013-10-13 MED ORDER — FERROUS SULFATE 325 (65 FE) MG PO TABS
325.0000 mg | ORAL_TABLET | Freq: Two times a day (BID) | ORAL | Status: DC
  Administered 2013-10-13 – 2013-10-26 (×26): 325 mg via ORAL
  Filled 2013-10-13 (×28): qty 1

## 2013-10-13 MED ORDER — INSULIN ASPART 100 UNIT/ML ~~LOC~~ SOLN: 0.0000 [IU] | Freq: Three times a day (TID) | SUBCUTANEOUS | Status: DC

## 2013-10-13 MED ORDER — DULOXETINE HCL 30 MG PO CPEP: 30.0000 mg | ORAL_CAPSULE | Freq: Every day | ORAL | Status: AC

## 2013-10-13 MED ORDER — OXYCODONE HCL ER 15 MG PO T12A
40.0000 mg | EXTENDED_RELEASE_TABLET | Freq: Two times a day (BID) | ORAL | Status: DC
  Administered 2013-10-14 – 2013-10-18 (×7): 40 mg via ORAL
  Filled 2013-10-13: qty 2
  Filled 2013-10-13: qty 1
  Filled 2013-10-13: qty 2
  Filled 2013-10-13 (×2): qty 1
  Filled 2013-10-13 (×3): qty 2
  Filled 2013-10-13 (×2): qty 1
  Filled 2013-10-13: qty 2
  Filled 2013-10-13: qty 1
  Filled 2013-10-13: qty 2
  Filled 2013-10-13: qty 1

## 2013-10-13 MED ORDER — DIPHENHYDRAMINE HCL 12.5 MG/5ML PO ELIX: 12.5000 mg | ORAL_SOLUTION | Freq: Four times a day (QID) | ORAL | Status: DC | PRN

## 2013-10-13 MED ORDER — SENNOSIDES-DOCUSATE SODIUM 8.6-50 MG PO TABS
2.0000 | ORAL_TABLET | Freq: Two times a day (BID) | ORAL | Status: DC
  Administered 2013-10-13 – 2013-10-27 (×24): 2 via ORAL
  Filled 2013-10-13 (×24): qty 2

## 2013-10-13 MED ORDER — INSULIN ASPART 100 UNIT/ML ~~LOC~~ SOLN
0.0000 [IU] | Freq: Every day | SUBCUTANEOUS | Status: DC
  Administered 2013-10-26 – 2013-10-27 (×2): 3 [IU] via SUBCUTANEOUS

## 2013-10-13 NOTE — Progress Notes (Signed)
Rehab admissions - I met with patient, his wife and his sister at the bedside this am.  All are in agreement to inpatient rehab.  Patient says that he is ready.  Bed available and will admit to acute inpatient rehab today.  Call me for questions.  #[phone]

## 2013-10-13 NOTE — Discharge Summary (Signed)
Physician Discharge Summary  Donald [name] YOV:[id number] DOB: [birth year] DOA: 10/07/2013  PCP: Maggie Font, MD  Admit date: 10/07/2013 Discharge date: 10/13/2013  Recommendations for Outpatient Follow-up:  1. Pt being discharge to CIR 2. 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 Trula Slade  -continue aggrenox  -pt refuses warfarin  -pain control-->morphine PCA started 10/09/13 with improving pain relief  -cathartics  -CIR has been consulted--> recommended transitioning off PCA pump and increased participation with PT before except acceptance.  -PCA ultimately d/ced and pt pain remains controlled -pt seen with Dr. Trula Slade at beside 6/13--L-groin bleed at old fistulous tract site-->scant amount--continue to monitor-->no further bleeding -Hemoglobin remained stable  -Increased OxyContin to 40 mg twice a day  Diabetes mellitus type 2  -09/06/2013 Hemoglobin of 7.7  -Half home dose of Levemir -can resume Levemir 10units at bedtime when ready for home -NovoLog sliding scale  -CBGs controlled  Tobacco abuse  -Tobacco cessation discussed  -Declines nicotine patch  Hypertension  -Restarted lisinopril  -BP remains stable Chronic pain/uncontrolled after sugery  -Restart OxyContin 20 mg every 12 hours-->increased to 40mg  every 12 hours  -OxyIR 5 mg every 4 hours when necessary pain  -morphine PCA as above-->pain better controlled-->now off -Decreased Toradol 15 mg due to the patient's age--received approximately 4 days  Normocytic anemia/Iron deficiency  -Likely due to chronic disease  -Check iron studies in the morning  -Monitor CBC--hemoglobin stable  -give a dose nulecit  -start ferrous sulfate 325mg  bid Hx of rectal cancer  -oncologist is Dr. Alen Blew  Situational Depression  -continue to monitor  -denies suicidal  ideation  -consulted psychiatry-->start cymbalta 30mg  daily Severe Protein Calorie Malnutrition  -nutritional supplementation  Constipation  -Continue Colace and Senokot  -Add bisacodyl  -Mag Citrate given   Discharge Condition: stable  Disposition: CIR Follow-up Information   Follow up with Trula Slade IV, [place], MD In 4 weeks. (Office will call you to arrange your appt (sent))    Specialty:  Vascular Surgery   Contact information:   [address] [place] [address] [phone]       Diet: heart healthy Wt Readings from Last 3 Encounters:  10/07/13 75.297 kg (166 lb)  10/07/13 75.297 kg (166 lb)  10/01/13 78.472 kg (173 lb)      Hospital Course:  68 y.o. male who was recently admitted for ischemic limb and had undergone left sided thrombectomy and left femoral popliteal bypass graft with patch angioplasty and fasciotomy (May 2015) with wound VAC was eventually discharged to rehabilitation. The pt was discharged home. He has been at home last 10 days and started experiencing worsening pain of his left lower extremity on the day of admission. In the ER patient was not having any palpable pulses down below the knee and his extremity was cool. Patient has previously refused any anticoagulants for his PVD. As a result, he was placed on Aggrenox. Vascular surgeon, Dr. Kellie Simmering was consulted. He recommeded amputation. 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 Postoperatively, the patient had uncontrolled pain and was started on a morphine PCA as well as Toradol. He also suffered from situational depression, but he denied any suicidal ideation. Psychiatry was consulted and recommended starting cymbalta. CIR evaluate the patient and recommended transitioning off PCA pump and increased participation with PT before except acceptance. Patient's morphine PCA pump was weaned off  after those are opioid doses were adjusted. His pain remained controlled. The patient participated with  physical therapy. Wound care nurse was consulted for the patient's sacral skin breakdown.   Consultants: Vascular surgery--Dr. Trula Slade  Discharge Exam: Filed Vitals:   10/13/13 0954  BP: 129/43  Pulse: 91  Temp: 97.9 F (36.6 C)  Resp: 16   Filed Vitals:   10/13/13 0612 10/13/13 0628 10/13/13 0744 10/13/13 0954  BP:  142/58  129/43  Pulse:  83  91  Temp:  98.9 F (37.2 C)  97.9 F (36.6 C)  TempSrc:  Oral    Resp: 20 18 16 16   Height:      Weight:      SpO2:  96% 96% 94%   General: A&O x 3, NAD, pleasant, cooperative Cardiovascular: RRR, no rub, no gallop, no S3 Respiratory: CTAB, no wheeze, no rhonchi Abdomen:soft, nontender, nondistended, positive bowel sounds Extremities: L-AKA site without bleed or ecchymosis--warm and well approximated  Discharge Instructions      Discharge Instructions   Diet - low sodium heart healthy    Complete by:  As directed      Increase activity slowly    Complete by:  As directed             Medication List    STOP taking these medications       cephALEXin 500 MG capsule  Commonly known as:  KEFLEX     naproxen sodium 220 MG tablet  Commonly known as:  ANAPROX      TAKE these medications       dipyridamole-aspirin 200-25 MG per 12 hr capsule  Commonly known as:  AGGRENOX  Take 1 capsule by mouth 2 (two) times daily.     DSS 100 MG Caps  Take 100 mg by mouth 2 (two) times daily.     docusate sodium 100 MG capsule  Commonly known as:  COLACE  Take 200 mg by mouth 2 (two) times daily as needed for mild constipation.     DULoxetine 30 MG capsule  Commonly known as:  CYMBALTA  Take 1 capsule (30 mg total) by mouth daily.     feeding supplement (GLUCERNA SHAKE) Liqd  Take 237 mLs by mouth daily.     ferrous fumarate 325 (106 FE) MG Tabs tablet  Commonly known as:  HEMOCYTE - 106 mg FE  Take 1 tablet (106 mg of iron total) by mouth 2 (two) times daily.     ferrous sulfate 325 (65 FE) MG tablet  Take 1 tablet  (325 mg total) by mouth 2 (two) times daily with a meal.     gabapentin 300 MG capsule  Commonly known as:  NEURONTIN  Take 1 capsule (300 mg total) by mouth 3 (three) times daily.     insulin detemir 100 UNIT/ML injection  Commonly known as:  LEVEMIR  Inject 10 Units into the skin daily.     lisinopril 5 MG tablet  Commonly known as:  PRINIVIL,ZESTRIL  Take 2.5 mg by mouth daily.     metFORMIN 500 MG tablet  Commonly known as:  GLUCOPHAGE  Take 500 mg by mouth 2 (two) times daily with a meal.     omega-3 acid ethyl esters 1 G capsule  Commonly known as:  LOVAZA  Take 2 g by mouth 2 (two) times daily.     omeprazole 40 MG capsule  Commonly known as:  PRILOSEC  Take 40 mg by mouth daily.  oxyCODONE 5 MG immediate release tablet  Commonly known as:  Oxy IR/ROXICODONE  Take 1-2 tablets (5-10 mg total) by mouth every 6 (six) hours as needed for severe pain.     OxyCODONE 40 mg T12a 12 hr tablet  Commonly known as:  OXYCONTIN  Take 1 tablet (40 mg total) by mouth every 12 (twelve) hours.     oxyCODONE-acetaminophen 5-325 MG per tablet  Commonly known as:  PERCOCET/ROXICET  Take 1 tablet by mouth every 4 (four) hours as needed for moderate pain or severe pain.     saccharomyces boulardii 250 MG capsule  Commonly known as:  FLORASTOR  Take 1 capsule (250 mg total) by mouth 2 (two) times daily. probiotic     senna 8.6 MG Tabs tablet  Commonly known as:  SENOKOT  Take 2 tablets (17.2 mg total) by mouth at bedtime.     senna-docusate 8.6-50 MG per tablet  Commonly known as:  Senokot-S  Take 2 tablets by mouth 2 (two) times daily. For constipation.     traMADol 50 MG tablet  Commonly known as:  ULTRAM  Take 1 tablet (50 mg total) by mouth every 6 (six) hours as needed for moderate pain.     traZODone 50 MG tablet  Commonly known as:  DESYREL  Take 0.5-1 tablets (25-50 mg total) by mouth at bedtime as needed for sleep.     vitamin C 1000 MG tablet  Take 500 mg by  mouth daily.         The results of significant diagnostics from this hospitalization (including imaging, microbiology, ancillary and laboratory) are listed below for reference.    Significant Diagnostic Studies: No results found.   Microbiology: Recent Results (from the past 240 hour(s))  SURGICAL PCR SCREEN     Status: None   Collection Time    10/08/13 12:12 AM      Result Value Ref Range Status   MRSA, PCR NEGATIVE  NEGATIVE Final   Staphylococcus aureus NEGATIVE  NEGATIVE Final   Comment:            The Xpert SA Assay (FDA     approved for NASAL specimens     in patients over 85 years of age),     is one component of     a comprehensive surveillance     program.  Test performance has     been validated by [employer] for patients greater     than or equal to 62 year old.     It is not intended     to diagnose infection nor to     guide or monitor treatment.     Labs: Basic Metabolic Panel:  Recent Labs Lab 10/08/13 0430 10/09/13 0500 10/10/13 0539 10/11/13 0554 10/12/13 0456  NA 139 136* 135* 137 136*  K 4.4 4.1 4.1 4.3 4.6  CL 101 96 95* 97 96  CO2 22 22 23 23 25   GLUCOSE 118* 109* 151* 136* 166*  BUN 9 7 13 14 13   CREATININE 0.82 0.77 0.90 0.92 0.86  CALCIUM 9.2 8.9 9.3 9.2 8.9   Liver Function Tests:  Recent Labs Lab 10/08/13 0430  AST 16  ALT 8  ALKPHOS 82  BILITOT 0.4  PROT 6.9  ALBUMIN 3.1*   No results found for this basename: LIPASE, AMYLASE,  in the last 168 hours No results found for this basename: AMMONIA,  in the last 168 hours CBC:  Recent Labs  Lab 10/07/13 1748 10/08/13 0430 10/09/13 0500 10/10/13 0539 10/11/13 0554  WBC 7.7 7.4 9.6 9.6 9.6  NEUTROABS 5.5 5.1  --   --   --   HGB 9.8* 9.9* 9.1* 10.1* 9.1*  HCT 32.3* 33.1* 30.2* 32.9* 30.1*  MCV 82.4 83.0 83.2 83.3 84.1  PLT 284 287 285 294 283   Cardiac Enzymes: No results found for this basename: CKTOTAL, CKMB, CKMBINDEX, TROPONINI,  in the last 168  hours BNP: No components found with this basename: POCBNP,  CBG:  Recent Labs Lab 10/12/13 1605 10/12/13 1931 10/12/13 2335 10/13/13 0356 10/13/13 0800  GLUCAP 116* 163* 128* 140* 156*    Time coordinating discharge:  Greater than 30 minutes  Signed:  TAT, DAVID, DO [facility] Pager: [phone] 10/13/2013, 10:19 AM

## 2013-10-13 NOTE — Progress Notes (Addendum)
   [facility]  Subjective  - Doing well no new complaints today.   Objective 142/58 83 98.9 F (37.2 C) (Oral) 16 96%  Intake/Output Summary (Last 24 hours) at 10/13/13 0749 Last data filed at 10/13/13 0600  Gross per 24 hour  Intake   1221 ml  Output    400 ml  Net    821 ml    Left stump is healing well. Good active motion of the hip flexors.  Assessment/Planning: POD #5 Left AKA Pending CIR. New medication started Cymbalta Staples will be removed 4 weeks from surgery. Patient asked to D/C PCA secondary to causing hot flashes, he prefers PO pain medications.  I will D/C the PCA.   Laurence Slate [facility] 10/13/2013 7:49 AM --  Laboratory Lab Results:  Recent Labs  10/11/13 0554  WBC 9.6  HGB 9.1*  HCT 30.1*  PLT 283   BMET  Recent Labs  10/11/13 0554 10/12/13 0456  NA 137 136*  K 4.3 4.6  CL 97 96  CO2 23 25  GLUCOSE 136* 166*  BUN 14 13  CREATININE 0.92 0.86  CALCIUM 9.2 8.9    COAG Lab Results  Component Value Date   INR 1.06 10/07/2013   INR 1.14 09/05/2013   INR 1.01 09/04/2013   No results found for this basename: PTT

## 2013-10-13 NOTE — H&P (Signed)
Physical Medicine and Rehabilitation Admission H&P  Chief Complaint   Patient presents with   .  L-AKA for ischemic LLE   HPI: Donald [name] is a 68 y.o. right-handed male with history of CVA with left-sided weakness, HTN, PVD with multiple revascularization procedures as well his recent thrombectomy of occluded FPBG with 4 compartment fasciotomy requiring VAC 5/ 2015. He was d/c from CIR on 09/24/13 and was ambulating at supervision level with RW. He started developing increasing LLE pain with onset of numbness 10/07/13 due to recurrent thrombosis left femoral popliteal bypass. Patient has refused chronic coumadin therapy as well as removal of infected L-axillofemoral BG. High L-AKA recommended due to level of demarcation. On 10/08/13, patient underwent L-AKA by Dr. Trula Slade and post op with severe depressive symptoms with lability as well as poor pain control. He was started on Oxycontin for more pain management as well as Toradol. Was treated with IV iron infusion for anemia. Pschiatry consulted for input and patient started on Cymbalta for severe acute depressive symptoms. PT/OT evaluations done and CIR recommended by Rehab team and MD. Patient admitted for comprehensive rehab program.  PCA discontinued today. [employer] to provide retention sock for AKA.  Review of Systems  HENT: Negative for hearing loss.  Eyes: Negative for blurred vision and double vision.  Respiratory: Negative for cough and shortness of breath.  Cardiovascular: Negative for chest pain and palpitations.  Gastrointestinal: Positive for constipation. Negative for nausea, vomiting and abdominal pain.  Genitourinary: Negative for urgency and frequency.  Musculoskeletal: Positive for myalgias.  Neurological: Positive for focal weakness (left hemiparesis). Negative for headaches.  Psychiatric/Behavioral: Positive for depression. The patient is nervous/anxious.   Past Medical History   Diagnosis  Date   .  GERD  (gastroesophageal reflux disease)    .  Hyperlipidemia    .  Hypertension    .  Peripheral vascular disease    .  Internal and external bleeding hemorrhoids    .  History of gout      left great toe   .  Depression    .  Numbness in right leg      Hx: of diabetes   .  Coronary artery disease      RCA occlusion with good collaterals, o/w no obs CAD 12/2011   .  Pneumonia  ~ 1977   .  Type II diabetes mellitus    .  History of blood transfusion  12/2011; 12/2012     "before OR; after OR" (01/22/2013)   .  Stroke  2008     "left side weak; unable to move left hand still" (01/22/2013)   .  Rectal cancer  dx'd 08/2007     S/P chemo, radiation, biopsies   .  Metastases to the liver  dx'd 08/2010    Past Surgical History   Procedure  Laterality  Date   .  Carotid endarterectomy  Right  2008   .  Liver cryoablation   08/2010     "chemo shrunk the cancer 50% then they went in and burned the rest of it"   .  Axillary-femoral bypass graft   01/29/2012     Procedure: BYPASS GRAFT AXILLA-BIFEMORAL; Surgeon: Elam Dutch, MD; Location: [facility] OR; Service: Vascular; Laterality: N/A; Left axilla-bifemoral bypass using gore-tex graft.   .  Colonoscopy w/ biopsies and polypectomy       Hx: of   .  Femoral-popliteal bypass graft  Left  01/06/2013     Procedure:  Femoral-Popliteal Artery Bypass; Surgeon: Elam Dutch, MD; Location: [place]; Service: Vascular; Laterality: Left;   .  Intraoperative arteriogram  Left  01/06/2013     Procedure: INTRA OPERATIVE ARTERIOGRAM; Surgeon: Elam Dutch, MD; Location: [place]; Service: Vascular; Laterality: Left;   .  Femoral-popliteal bypass graft  Left  01/10/2013     Procedure: THROMBECTOMY POSSIBLE BYPASS GRAFT FEMORAL-POPLITEAL ARTERY; Surgeon: Mal Misty, MD; Location: [place]; Service: Vascular; Laterality: Left; Attempted Thrombectomy of left Femoral/Popliteal graft - Unsuccessful. Insertion of Left femoral to below the knee popliteal propaten graft.   .  Mass  biopsy   2009     "found mass that was cancer; later had chemo and radiation; never cut mass out" (01/22/2013)   .  Femoral-popliteal bypass graft  Left  09/05/2013     Procedure: Thrombectomy of Left Femoral-Below Knee Popliteal Bypass Graft; Patch Angioplasty of Distal Portion of Bypass Graft; Surgeon: Angelia Mould, MD; Location: [place]; Service: Vascular; Laterality: Left;   .  Patch angioplasty  Left  09/05/2013     Procedure: Patch Angioplasty Distal Portion of Left Femoral-Below Knee Popliteal Bypass Graft; Surgeon: Angelia Mould, MD; Location: [place]; Service: Vascular; Laterality: Left;   .  Intraoperative arteriogram  Left  09/05/2013     Procedure: INTRA OPERATIVE ARTERIOGRAM; Surgeon: Angelia Mould, MD; Location: [place]; Service: Vascular; Laterality: Left;   .  Fasciotomy  Left  09/05/2013     Procedure: FASCIOTOMY; Surgeon: Angelia Mould, MD; Location: [place]; Service: Vascular; Laterality: Left;   Marland Kitchen  Vascular surgery     .  Fasciotomy closure  Left  09/11/2013     Procedure: CLOSURE OF FASCIOTOMY SITES LEFT LOWER EXTREMITY AND WOUND VAC APPLICATION; Surgeon: Angelia Mould, MD; Location: [facility] OR; Service: Vascular; Laterality: Left;    Family History   Problem  Relation  Age of Onset   .  Cancer - Other  Sister     Social History: Married. Independent with cane PTA. He reports that he has quit smoking. His smoking use included Cigarettes. He has a 5.7 pack-year smoking history. He has never used smokeless tobacco. He reports that he does not drink alcohol or use illicit drugs.  Allergies   Allergen  Reactions   .  Other      Became hypotensive. Pt states allergic to "muscle relaxer" but unsure of name.   .  Tape  Hives and Itching     medfix non woven tape    Medications Prior to Admission   Medication  Sig  Dispense  Refill   .  Ascorbic Acid (VITAMIN C) 1000 MG tablet  Take 500 mg by mouth daily.     .  cephALEXin (KEFLEX) 500 MG capsule  Take 1  capsule (500 mg total) by mouth 4 (four) times daily.  120 capsule  0   .  dipyridamole-aspirin (AGGRENOX) 200-25 MG per 12 hr capsule  Take 1 capsule by mouth 2 (two) times daily.     Marland Kitchen  docusate sodium (COLACE) 100 MG capsule  Take 200 mg by mouth 2 (two) times daily as needed for mild constipation.     .  ferrous fumarate (HEMOCYTE - 106 MG FE) 325 (106 FE) MG TABS tablet  Take 1 tablet (106 mg of iron total) by mouth 2 (two) times daily.  60 each  1   .  gabapentin (NEURONTIN) 300 MG capsule  Take 1 capsule (300 mg total) by mouth 3 (  three) times daily.  90 capsule  6   .  insulin detemir (LEVEMIR) 100 UNIT/ML injection  Inject 10 Units into the skin daily.     Marland Kitchen  lisinopril (PRINIVIL,ZESTRIL) 5 MG tablet  Take 2.5 mg by mouth daily.     .  metFORMIN (GLUCOPHAGE) 500 MG tablet  Take 500 mg by mouth 2 (two) times daily with a meal.     .  naproxen sodium (ANAPROX) 220 MG tablet  Take 220 mg by mouth daily as needed (pain).     Marland Kitchen  omega-3 acid ethyl esters (LOVAZA) 1 G capsule  Take 2 g by mouth 2 (two) times daily.     Marland Kitchen  omeprazole (PRILOSEC) 40 MG capsule  Take 40 mg by mouth daily.     Marland Kitchen  oxyCODONE (OXY IR/ROXICODONE) 5 MG immediate release tablet  Take 1-2 tablets (5-10 mg total) by mouth every 6 (six) hours as needed for severe pain.  120 tablet  0   .  OxyCODONE (OXYCONTIN) 20 mg T12A 12 hr tablet  Take 1 tablet (20 mg total) by mouth every 12 (twelve) hours. Long acting pain medication.  60 tablet  0   .  oxyCODONE-acetaminophen (PERCOCET/ROXICET) 5-325 MG per tablet  Take 1 tablet by mouth every 4 (four) hours as needed for moderate pain or severe pain.     Marland Kitchen  saccharomyces boulardii (FLORASTOR) 250 MG capsule  Take 1 capsule (250 mg total) by mouth 2 (two) times daily. probiotic  60 capsule  1   .  senna-docusate (SENOKOT-S) 8.6-50 MG per tablet  Take 2 tablets by mouth 2 (two) times daily. For constipation.     .  traMADol (ULTRAM) 50 MG tablet  Take 1 tablet (50 mg total) by mouth every  6 (six) hours as needed for moderate pain.  45 tablet  0   .  traZODone (DESYREL) 50 MG tablet  Take 0.5-1 tablets (25-50 mg total) by mouth at bedtime as needed for sleep.  30 tablet  0    Home:  Home Living  Family/patient expects to be discharged to:: Inpatient rehab  Living Arrangements: Spouse/significant other  Type of Home: House  Home Access: Stairs to enter  [employer] of Steps: 16 steps in the front; 1 in the back (usually enter in the back)  Entrance Stairs-Rails: Right;Left  Home Layout: Two level;Bed/bath upstairs  Alternate Level Stairs-Number of Steps: flight, patient has lift chair  Alternate Level Stairs-Rails: Right;Left  Additional Comments: Pt has a stair chair lift now for going upstairs.  Functional History:  Prior Function  Level of Independence: Needs assistance  ADL's / Homemaking Assistance Needed: pt has wife (A) for adls since CIR admission  Functional Status:  Mobility:  Bed Mobility  Overal bed mobility: Needs Assistance  Bed Mobility: Supine to Sit;Sit to Supine  Supine to sit: +2 for safety/equipment;Max assist  Sit to supine: Mod assist;+2 for physical assistance  General bed mobility comments: Bridged to EOB with max, up to EOB with heavy mod assist, scooted to EOB with mod.; cues for had placement  Transfers  General transfer comment: declined trying transfer of continuing to sit EOB    ADL:  ADL  Overall ADL's : Needs assistance/impaired  Grooming: Set up;Sitting  Lower Body Bathing: Total assistance  General ADL Comments: sESSION focused on education and bed level mobility. Pt very anxious with therapist arrival. pt tearful at times. Pt expressed several times during session "I just wish I would die" "  it might be the chicken way out but i want to die". pt expressed extreme embarrassment with any male (A) for peri care and bathing. pt prefers all male staff when possible and understands this can not be provided for all disciplines/  LOS. pt educated OT does not currently have male therapist on staff but he could return to CIR with male (A). Pt states therapist by name from previous admission and asking about how they are doing. pt educated on the need to provided tactile input to LT LE. pt refusing to touch LT LE stating " it will hurt" " it will cause me more pain. I am not going to do that" Pt educated why this is important, demo on Rt LE and encouraged to attempt later today after he felt he was able to attempt. wife presnt and encouraging patient. Pt educated on hip neutral position and extension to prevent hip contraction in a flexion position. Pt educated on the need for pressure relife and pt declined all attempts at repositioning. Recommend mattress overlay. Pt is high risk for break down. pt demonstrates long sitting in bed using RT UE and bed rail to complete task.  Cognition:  Cognition  Overall Cognitive Status: Within Functional Limits for tasks assessed  Orientation Level: Oriented X4  Cognition  Arousal/Alertness: Awake/alert  Behavior During Therapy: Anxious  Overall Cognitive Status: Within Functional Limits for tasks assessed  Physical Exam:  Blood pressure 123/98, pulse 76, temperature 97.6 F (36.4 C), temperature source Oral, resp. rate 15, height $RemoveBe'5\' 9"'lMtTmNwaj$  (1.753 m), weight 75.297 kg (166 lb), SpO2 99.00%.  Physical Exam  Nursing note reviewed.   Vitals reviewed.  HENT:  Head: Normocephalic.  Eyes: EOM are normal.  Neck: Normal range of motion. Neck supple. No thyromegaly present.  Cardiovascular: Normal rate and regular rhythm. No murmur Respiratory: Effort normal and breath sounds normal. No respiratory distress.  GI: Soft. Bowel sounds are normal. He exhibits no distension. Non-tender Neurological: He is alert.  Speech is a bit dysarthric but intelligible. He was oriented x3.   Patient followed commands. Fair insight and awareness.  upper extremity strength is 5/5 in the deltoid, bicep, tricep on  the right and 3/5 in the deltoid, bicep, tricep, 2/5 wrist, HI on the left, LUE spastic 2/4  Left hip flexor  1-2/5 (pain inhibition)  Right lower extremity 4/5 and hip flexor knee extensor ankle dorsiflexor plantar flexor Decreased sensation to LT LUE/ LLE.  Musc: left AKA with swelling, appropriately tender.  Skin: Left AK wound clean and intact with staples. Large area of macular rash with scattered areas of breakdown within the region throughout the saddle area. Area is tender to touch and covered with powder and apparent  Barrier cream  Psysch: pt tearful at times, somewhat anxious   Results for orders placed during the hospital encounter of 10/07/13 (from the past 48 hour(s))   GLUCOSE, CAPILLARY Status: Abnormal    Collection Time    10/10/13 11:47 AM   Result  Value  Ref Range    Glucose-Capillary  124 (*)  70 - 99 mg/dL   GLUCOSE, CAPILLARY Status: Abnormal    Collection Time    10/10/13 3:56 PM   Result  Value  Ref Range    Glucose-Capillary  115 (*)  70 - 99 mg/dL   GLUCOSE, CAPILLARY Status: Abnormal    Collection Time    10/10/13 9:06 PM   Result  Value  Ref Range    Glucose-Capillary  138 (*)  70 - 99 mg/dL    Comment 1  Notify RN    GLUCOSE, CAPILLARY Status: Abnormal    Collection Time    10/11/13 12:06 AM   Result  Value  Ref Range    Glucose-Capillary  149 (*)  70 - 99 mg/dL    Comment 1  Notify RN    CBC Status: Abnormal    Collection Time    10/11/13 5:54 AM   Result  Value  Ref Range    WBC  9.6  4.0 - 10.5 K/uL    RBC  3.58 (*)  4.22 - 5.81 MIL/uL    Hemoglobin  9.1 (*)  13.0 - 17.0 g/dL    HCT  16.5 (*)  57.1 - 52.0 %    MCV  84.1  78.0 - 100.0 fL    MCH  25.4 (*)  26.0 - 34.0 pg    MCHC  30.2  30.0 - 36.0 g/dL    RDW  32.2 (*)  73.8 - 15.5 %    Platelets  283  150 - 400 K/uL   BASIC METABOLIC PANEL Status: Abnormal    Collection Time    10/11/13 5:54 AM   Result  Value  Ref Range    Sodium  137  137 - 147 mEq/L    Potassium  4.3  3.7 - 5.3  mEq/L    Chloride  97  96 - 112 mEq/L    CO2  23  19 - 32 mEq/L    Glucose, Bld  136 (*)  70 - 99 mg/dL    BUN  14  6 - 23 mg/dL    Creatinine, Ser  9.26  0.50 - 1.35 mg/dL    Calcium  9.2  8.4 - 10.5 mg/dL    GFR calc non Af Amer  84 (*)  >90 mL/min    GFR calc Af Amer  >90  >90 mL/min    Comment:  (NOTE)     The eGFR has been calculated using the CKD EPI equation.     This calculation has not been validated in all clinical situations.     eGFR's persistently <90 mL/min signify possible Chronic Kidney     Disease.   IRON AND TIBC Status: Abnormal    Collection Time    10/11/13 5:54 AM   Result  Value  Ref Range    Iron  11 (*)  42 - 135 ug/dL    TIBC  018  151 - 904 ug/dL    Saturation Ratios  4 (*)  20 - 55 %    UIBC  270  125 - 400 ug/dL    Comment:  Performed at [facility]   FERRITIN Status: None    Collection Time    10/11/13 5:54 AM   Result  Value  Ref Range    Ferritin  36  22 - 322 ng/mL    Comment:  Performed at [facility]   GLUCOSE, CAPILLARY Status: Abnormal    Collection Time    10/11/13 7:58 AM   Result  Value  Ref Range    Glucose-Capillary  142 (*)  70 - 99 mg/dL   GLUCOSE, CAPILLARY Status: Abnormal    Collection Time    10/11/13 11:55 AM   Result  Value  Ref Range    Glucose-Capillary  167 (*)  70 - 99 mg/dL   GLUCOSE, CAPILLARY Status: Abnormal    Collection Time    10/11/13 4:15  PM   Result  Value  Ref Range    Glucose-Capillary  102 (*)  70 - 99 mg/dL   GLUCOSE, CAPILLARY Status: Abnormal    Collection Time    10/11/13 9:49 PM   Result  Value  Ref Range    Glucose-Capillary  131 (*)  70 - 99 mg/dL   BASIC METABOLIC PANEL Status: Abnormal    Collection Time    10/12/13 4:56 AM   Result  Value  Ref Range    Sodium  136 (*)  137 - 147 mEq/L    Potassium  4.6  3.7 - 5.3 mEq/L    Chloride  96  96 - 112 mEq/L    CO2  25  19 - 32 mEq/L    Glucose, Bld  166 (*)  70 - 99 mg/dL    BUN  13  6 - 23 mg/dL    Creatinine, Ser  0.86   0.50 - 1.35 mg/dL    Calcium  8.9  8.4 - 10.5 mg/dL    GFR calc non Af Amer  86 (*)  >90 mL/min    GFR calc Af Amer  >90  >90 mL/min    Comment:  (NOTE)     The eGFR has been calculated using the CKD EPI equation.     This calculation has not been validated in all clinical situations.     eGFR's persistently <90 mL/min signify possible Chronic Kidney     Disease.   GLUCOSE, CAPILLARY Status: Abnormal    Collection Time    10/12/13 7:37 AM   Result  Value  Ref Range    Glucose-Capillary  139 (*)  70 - 99 mg/dL    No results found.  Medical Problem List and Plan:  1. 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 DVT Prophylaxis/Anticoagulation: Pharmaceutical: Lovenox  3. Pain Management: On oxycontin 40 mg bid. Oxycodone 10 mg prn breakthrough pain.  4. Depressed Mood: continue cymbalta. Team to provide ego support. LCSW to follow for evaluation and support.  5. Neuropsych: This patient is capable of making decisions on his own behalf.  6. HTN: Monitor every 8 hours. Lisinopril resumed today.  7. H/o rectal cancer: Has problems with rectal pain when constipated. No results with mag citrated. Will treat with SSE.  8. Chronic constipation: Will increase Senna S to 2 pills bid. Suppository every 24 hours if no BM.  9. DM type 2: Will monitor BS with ac/hs checks. Continue Lantus 5 units and will titrate to home dose as po's improve.  10. Iron deficiency anemia: Loaded with IV iron. Continue po supplement.  29. H/o CVA with left spastic hemiparesis: Continue aggrenox.  12. Skin: large fungal rash with breakdown in saddle area. Add diflucan, continue local care per [place]. Air mattress  Post Admission Physician Evaluation:  1. 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 Patient is admitted to receive collaborative, interdisciplinary care between the physiatrist, rehab nursing staff, and therapy team. 3. Patient's level of medical complexity and substantial therapy needs  in context of that medical necessity cannot be provided at a lesser intensity of care such as a SNF. 4. Patient has experienced substantial functional loss from his/her baseline which was documented above under the "Functional History" and "Functional Status" headings. Judging by the patient's diagnosis, physical exam, and functional history, the patient has potential for functional progress which will result in measurable gains while on inpatient rehab. These gains will be of  substantial and practical use upon discharge in facilitating mobility and self-care at the household level. 5. Physiatrist will provide 24 hour management of medical needs as well as oversight of the therapy plan/treatment and provide guidance as appropriate regarding the interaction of the two. 6. 24 hour rehab nursing will assist with bladder management, bowel management, safety, skin/wound care, disease management, medication administration, pain management and patient education and help integrate therapy concepts, techniques,education, etc. 7. PT will assess and treat for/with: Lower extremity strength, range of motion, stamina, balance, functional mobility, safety, adaptive techniques and equipment, NMR, pain mgt, pre-prosthetic ed, coping skills, caregiver training. Goals are: supervision to min assist. 8. OT will assess and treat for/with: ADL's, functional mobility, safety, upper extremity strength, adaptive techniques and equipment, pain mgt, caregiver education, egosupport, . Goals are: supervision to min assist. 9. SLP will assess and treat for/with: n/a. Goals are: n/a. 10. Case Management and Social Worker will assess and treat for psychological issues and discharge planning. 11. Team conference will be held weekly to assess progress toward goals and to determine barriers to discharge. 12. Patient will receive at least 3 hours of therapy per day at least 5 days per week. 13. ELOS: 11-14 days  14. Prognosis:  good  Meredith Staggers, MD, [facility]   10/12/2013

## 2013-10-13 NOTE — Progress Notes (Signed)
Orthopedic Tech Progress Note Patient Details:  Donald [name] 1946-04-30 [id number] [employer] called to place brace order Patient ID: Donald [name], male   DOB: 08-Oct-1945, 68 y.o.   MRN: [id number]   [name] 10/13/2013, 11:19 AM

## 2013-10-13 NOTE — Progress Notes (Signed)
Report called to [place], Emma on 4W.  All questions answered and instructed to call me if any further questions.  Awaiting IV nurse to cap portacath and then will send down.  Syliva Overman

## 2013-10-13 NOTE — Progress Notes (Signed)
PCA discontinued.  Pt. Tolerated well and given PO pain medicine.  Will continue to monitor. Syliva Overman

## 2013-10-13 NOTE — Progress Notes (Signed)
PMR Admission Coordinator Pre-Admission Assessment  Patient: Donald [name] is an 68 y.o., male  MRN: [id number]  DOB: 30-Aug-1945  Height: 5\' 9"  (175.3 cm)  Weight: 75.297 kg (166 lb)  Insurance Information  HMO: No PPO: PCP: IPA: 80/20: OTHER:  PRIMARY: Medicare A/B Policy#: [id number] Subscriber: [name]  CM Name: Phone#: Fax#:  Pre-Cert#: Employer: Retired  Benefits: Phone #: Name: Checked in [place]. Date: 12/29/08 Deduct: $1260 Out of Pocket Max: none Life Max: unlimited  CIR: 100% SNF: 100 days  Outpatient: 80% Co-Pay: 20%  Home Health: 100% Co-Pay: none  DME: 80% Co-Pay: 20%  Providers: patient's choice   SECONDARY: A[employer] Policy#: [id number] Subscriber: [name]  CM Name: Phone#: Fax#:  Pre-Cert#: Employer: Retired  Benefits: Phone #: [phone] Name:  Eff. Date: Deduct: Out of Pocket Max: Life Max:  CIR: SNF:  Outpatient: Co-Pay:  Home Health: Co-Pay:  DME: Co-Pay:  Emergency Contact Information  Contact Information    Name  Relation  Home  Work  Mobile    [place]  Spouse  [phone]      [name]    [phone]      Current Medical History  Patient Admitting Diagnosis: L AKA  History of Present Illness: A 68 y.o. right-handed male with history of CVA with left-sided weakness, HTN, PVD with multiple revascularization procedures as well his recent thrombectomy of occluded FPBG with 4 compartment fasciotomy requiring VAC 5/ 2015. He was d/c from CIR on 09/24/13 and was ambulating at supervision level with RW. He started developing increasing LLE pain with onset of numbness 10/07/13 due to recurrent thrombosis left femoral popliteal bypass. Patient has refused chronic coumadin therapy as well as removal of infected L-axillofemoral BG. High L-AKA recommended due to level of demarcation. On 10/08/13, patient underwent L-AKA by Dr. Trula Slade and post op with severe depressive symptoms with lability as well as poor pain control. He was  started on Oxycontin for more pain management as well as Toradol. Was treated with IV iron infusion for anemia. Pschiatry consulted for input and patient started on Cymbalta for severe acute depressive symptoms. PCA discontinued today. [employer] to provide retention sock for AKA. PT/OT evaluations done and CIR recommended by Rehab team and MD. Patient to be admitted for comprehensive inpatient rehab program.  Past Medical History  Past Medical History   Diagnosis  Date   .  GERD (gastroesophageal reflux disease)    .  Hyperlipidemia    .  Hypertension    .  Peripheral vascular disease    .  Internal and external bleeding hemorrhoids    .  History of gout      left great toe   .  Depression    .  Numbness in right leg      Hx: of diabetes   .  Coronary artery disease      RCA occlusion with good collaterals, o/w no obs CAD 12/2011   .  Pneumonia  ~ 1977   .  Type II diabetes mellitus    .  History of blood transfusion  12/2011; 12/2012     "before OR; after OR" (01/22/2013)   .  Stroke  2008     "left side weak; unable to move left hand still" (01/22/2013)   .  Rectal cancer  dx'd 08/2007     S/P chemo, radiation, biopsies   .  Metastases to the liver  dx'd 08/2010    Family History  family history includes  Cancer - Other in his sister.  Prior Rehab/Hospitalizations: Was on CIR 05/14.  Current Medications  Current facility-administered medications:acetaminophen (TYLENOL) suppository 650 mg, 650 mg, Rectal, Q6H PRN, Rise Patience, MD; acetaminophen (TYLENOL) tablet 650 mg, 650 mg, Oral, Q6H PRN, Rise Patience, MD; alum & mag hydroxide-simeth (MAALOX/MYLANTA) 200-200-20 MG/5ML suspension 15-30 mL, 15-30 mL, Oral, Q2H PRN, Ulyses Amor, PA-C  dipyridamole-aspirin (AGGRENOX) 200-25 MG per 12 hr capsule 1 capsule, 1 capsule, Oral, BID, Orson Eva, MD, 1 capsule at 10/13/13 0956; docusate sodium (COLACE) capsule 100 mg, 100 mg, Oral, BID, Orson Eva, MD, 100 mg at 10/13/13 2595; DULoxetine  (CYMBALTA) DR capsule 30 mg, 30 mg, Oral, Daily, Durward Parcel, MD, 30 mg at 10/13/13 0955  enoxaparin (LOVENOX) injection 40 mg, 40 mg, Subcutaneous, Q24H, Emma M Collins, PA-C, 40 mg at 10/12/13 2129; feeding supplement (GLUCERNA SHAKE) (GLUCERNA SHAKE) liquid 237 mL, 237 mL, Oral, Daily, Reanne Maryland Pink, RD; ferric gluconate (NULECIT) 125 mg in sodium chloride 0.9 % 100 mL IVPB, 125 mg, Intravenous, Once, Orson Eva, MD, 125 mg at 10/13/13 1103; ferrous sulfate tablet 325 mg, 325 mg, Oral, BID WC, Orson Eva, MD  guaiFENesin-dextromethorphan (ROBITUSSIN DM) 100-10 MG/5ML syrup 15 mL, 15 mL, Oral, Q4H PRN, Ulyses Amor, PA-C; hydrALAZINE (APRESOLINE) injection 10 mg, 10 mg, Intravenous, Q4H PRN, Rise Patience, MD, 10 mg at 10/10/13 0456; insulin aspart (novoLOG) injection 0-9 Units, 0-9 Units, Subcutaneous, TID WC, Rise Patience, MD, 2 Units at 10/13/13 6387  insulin glargine (LANTUS) injection 5 Units, 5 Units, Subcutaneous, QHS, Rise Patience, MD, 5 Units at 10/12/13 2129; ketorolac (TORADOL) 30 MG/ML injection 15 mg, 15 mg, Intravenous, 4 times per day, Orson Eva, MD, 15 mg at 10/13/13 5643; labetalol (NORMODYNE,TRANDATE) injection 10 mg, 10 mg, Intravenous, Q2H PRN, Ulyses Amor, PA-C; lisinopril (PRINIVIL,ZESTRIL) tablet 2.5 mg, 2.5 mg, Oral, Daily, Orson Eva, MD, 2.5 mg at 10/13/13 3295  magnesium sulfate IVPB 2 g 50 mL, 2 g, Intravenous, Daily PRN, Ulyses Amor, PA-C; metoprolol (LOPRESSOR) injection 2-5 mg, 2-5 mg, Intravenous, Q2H PRN, Ulyses Amor, PA-C; morphine 2 MG/ML injection 2 mg, 2 mg, Intravenous, Q2H PRN, Hulen Shouts Rhyne, PA-C, 2 mg at 10/09/13 1053; multivitamin with minerals tablet 1 tablet, 1 tablet, Oral, Daily, Baird Lyons, RD, 1 tablet at 10/13/13 0956  ondansetron (ZOFRAN) injection 4 mg, 4 mg, Intravenous, Q6H PRN, Rise Patience, MD; ondansetron (ZOFRAN) tablet 4 mg, 4 mg, Oral, Q6H PRN, Rise Patience, MD; oxyCODONE (Oxy  IR/ROXICODONE) immediate release tablet 5 mg, 5 mg, Oral, Q4H PRN, Orson Eva, MD, 5 mg at 10/09/13 0426; OxyCODONE (OXYCONTIN) 12 hr tablet 40 mg, 40 mg, Oral, Q12H, Orson Eva, MD, 40 mg at 10/13/13 0956  oxyCODONE-acetaminophen (PERCOCET/ROXICET) 5-325 MG per tablet 1-2 tablet, 1-2 tablet, Oral, Q4H PRN, Ulyses Amor, PA-C, 2 tablet at 10/13/13 [phone]; pantoprazole (PROTONIX) EC tablet 40 mg, 40 mg, Oral, Daily, Ulyses Amor, PA-C, 40 mg at 10/13/13 1660; phenol (CHLORASEPTIC) mouth spray 1 spray, 1 spray, Mouth/Throat, PRN, Ulyses Amor, PA-C  potassium chloride SA (K-DUR,KLOR-CON) CR tablet 20-40 mEq, 20-40 mEq, Oral, Daily PRN, Ulyses Amor, PA-C; senna (SENOKOT) tablet 17.2 mg, 2 tablet, Oral, QHS, David Tat, MD, 17.2 mg at 10/12/13 2128; sodium chloride 0.9 % injection 10-40 mL, 10-40 mL, Intracatheter, PRN, Orson Eva, MD, 10 mL at 10/12/13 0456; traZODone (DESYREL) tablet 25 mg, 25 mg, Oral, QHS, Orson Eva, MD, 25 mg at 10/12/13 2129  Patients  Current Diet: Carb Control  Precautions / Restrictions  Precautions  Precautions: Fall  Precaution Comments: LT AKA wrap  Restrictions  Weight Bearing Restrictions: No  Prior Activity Level  Household: Mostly homebound, did not go out much.  Home Assistive Devices / Equipment  Home Assistive Devices/Equipment: Cane (specify quad or straight);Eyeglasses  Prior Functional Level  Prior Function  Level of Independence: Needs assistance  ADL's / Homemaking Assistance Needed: pt has wife (A) for adls since CIR admission  Current Functional Level  Cognition  Overall Cognitive Status: Within Functional Limits for tasks assessed  Orientation Level: Oriented X4   Extremity Assessment  (includes Sensation/Coordination)  Upper Extremity Assessment: Defer to OT evaluation;Generalized weakness  Lower Extremity Assessment: RLE deficits/detail;LLE deficits/detail  RLE Deficits / Details: generally weak--quads 4/5, hams hip flexors 4-/5, df/pf 3+/5  LLE  Deficits / Details: painful and move little against gravity or in ab/add  Cervical / Trunk Assessment: Normal   ADLs  Overall ADL's : Needs assistance/impaired  Grooming: Set up;Sitting  Lower Body Bathing: Total assistance  General ADL Comments: sESSION focused on education and bed level mobility. Pt very anxious with therapist arrival. pt tearful at times. Pt expressed several times during session "I just wish I would die" "it might be the chicken way out but i want to die". pt expressed extreme embarrassment with any male (A) for peri care and bathing. pt prefers all male staff when possible and understands this can not be provided for all disciplines/ LOS. pt educated OT does not currently have male therapist on staff but he could return to CIR with male (A). Pt states therapist by name from previous admission and asking about how they are doing. pt educated on the need to provided tactile input to LT LE. pt refusing to touch LT LE stating " it will hurt" " it will cause me more pain. I am not going to do that" Pt educated why this is important, demo on Rt LE and encouraged to attempt later today after he felt he was able to attempt. wife presnt and encouraging patient. Pt educated on hip neutral position and extension to prevent hip contraction in a flexion position. Pt educated on the need for pressure relife and pt declined all attempts at repositioning. Recommend mattress overlay. Pt is high risk for break down. pt demonstrates long sitting in bed using RT UE and bed rail to complete task.   Mobility  Overal bed mobility: Needs Assistance  Bed Mobility: Sidelying to Sit  Supine to sit: Min assist  Sit to supine: Mod assist;+2 for physical assistance  General bed mobility comments: Bridged to EOB with min assist; minimal assist to EOB   Transfers  Overall transfer level: Needs assistance  Equipment used: (chair back)  Transfers: Sit to/from [employer]  Sit to Stand: Min  assist;From elevated surface;+2 safety/equipment (times 3)  Squat pivot transfers: Min assist;+2 safety/equipment  General transfer comment: cues for sequencign and to calm anxiety; steady assist   Ambulation / Gait / Stairs / Education administrator / Balance  Dynamic Sitting Balance  Sitting balance - Comments: Much more calm with static sitting and scooting to EOB   Special needs/care consideration  BiPAP/CPAP No  CPM No  Continuous Drip IV No  Dialysis No  Life Vest No  Oxygen No  Special Bed No, but could benefit from an air overlay to assist with coccyx healing per [facility] nurse.  Trach Size No  Wound Vac (area)  No  Skin L BKA incision. Seen by [facility] nurse. Please see note from her on 10/13/13 in the acute chart. Patient has a fungal/yeast infection on his bottom area.  Bowel mgmt: Last BM 10/08/13  Bladder mgmt: Using urinal, has urgency  Diabetic mgmt Yes, on insulin at home   Previous Home Environment  Living Arrangements: Spouse/significant other  Type of Home: House  Home Layout: Two level;Bed/bath upstairs  Alternate Level Stairs-Rails: Right;Left  Alternate Level Stairs-Number of Steps: flight, patient has lift chair  Home Access: Stairs to enter  Entrance Stairs-Rails: Right;Left  Entrance Stairs-Number of Steps: 16 steps in the front; 1 in the back (usually enter in the back)  Bathroom Shower/Tub: Walk-in shower;Door  Home Care Services: No  Additional Comments: Pt has a stair chair lift now for going upstairs.  Discharge Living Setting  Plans for Discharge Living Setting: Patient's home;House;Lives with (comment) (Lives with wife. Has no children.)  Type of Home at Discharge: House  Discharge Home Layout: Two level;Bed/bath upstairs  Alternate Level Stairs-Number of Steps: Flight  Discharge Home Access: Stairs to enter  Entrance Stairs-Number of Steps: 16 steps front entrance and 1 step at back entry into kitchen.  Does the patient have any problems obtaining  your medications?: No  Social/Family/Support Systems  Patient Roles: Spouse  Contact Information: Ewart Carrera - wife [phone]  Anticipated Caregiver: wife Bethena Roys  Ability/Limitations of Caregiver: Wife assists with ADLs and can act as caregiver as she has been doing PTA.  Caregiver Availability: 24/7  Discharge Plan Discussed with Primary Caregiver: Yes  Is Caregiver In Agreement with Plan?: Yes  Does Caregiver/Family have Issues with Lodging/Transportation while Pt is in Rehab?: No  Goals/Additional Needs  Patient/Family Goal for Rehab: PT/OT min A, no ST needs  Expected length of stay: 10-14 days  Cultural Considerations: None  Dietary Needs: Carb mod med cal, thin liquids  Equipment Needs: TBD  Pt/Family Agrees to Admission and willing to participate: Yes  Program Orientation Provided & Reviewed with Pt/Caregiver Including Roles & Responsibilities: Yes  Decrease burden of Care through IP rehab admission: N/A  Possible need for SNF placement upon discharge: Not planned  Patient Condition: This patient's medical and functional status has changed since the consult dated: 10/09/13 in which the Rehabilitation Physician determined and documented that the patient's condition is appropriate for intensive rehabilitative care in an inpatient rehabilitation facility. See "History of Present Illness" (above) for medical update. Functional changes are: PCA discontinued today. Currently transferring min assist +2. Patient's medical and functional status update has been discussed with the Rehabilitation physician and patient remains appropriate for inpatient rehabilitation. Will admit to inpatient rehab today.  Preadmission Screen Completed By: [name], 10/13/2013 12:00 PM  ______________________________________________________________________  Discussed status with Dr. Naaman Plummer on 10/13/13 at 1259 and received telephone approval for admission today.  Admission Coordinator: [name],  time1259/Date06/16/15  Cosigned by: Meredith Staggers, MD [10/13/2013 2:09 PM]

## 2013-10-13 NOTE — Consult Note (Signed)
WOC wound consult note Reason for Consult:Patinet consult requested for persistent reddened area at perineum despite aggressive moisturizing protocol both her in house and previously at home.  Area is blanching erythema with confluent center and scattered satellite lesions consistent with fungal overgrowth following antibiotic coverage. 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 I recommend consideration for the use of a therapeutic mattress replacement with low air loss feature for management of MASD that will dry these tissues and retard fungal growth.  Additionally, I suggest systemic antifungal treatment (such as Diflucan) if patient is not receiving anticoagulation therapy such as Coumadin or Warfarin (I believe that notes indicate that he is not). A 150mg  dose given every other day for two doses is likely to jump start this patient's recovery from this skin condition. Finally, a topical application of an OTC antifungal powder (2% miconazole) available to the staff nurses is Micro guard Powder. This, topped with a skin barrier cream should yield results in 5-10 days when used in conjunction with the other therapies (oral treatment and a low air loss sleep surface). If you agree, please order. [facility] nursing team will not follow, but will remain available to this patient, the nursing and medical  teams.  Please re-consult if needed. Thanks, Maudie Flakes, MSN, RN, [place], [place], [place] [phone])

## 2013-10-13 NOTE — PMR Pre-admission (Signed)
PMR Admission Coordinator Pre-Admission Assessment  Patient: Donald [name] is an 68 y.o., male MRN: [id number] DOB: 1946/04/18 Height: 5\' 9"  (175.3 cm) Weight: 75.297 kg (166 lb)              Insurance Information HMO: No    PPO:       PCP:       IPA:       80/20:       OTHER:   PRIMARY: Medicare A/B      Policy#: [id number]      Subscriber: Donald [name] CM Name:        Phone#:       Fax#:   Pre-Cert#:        Employer: Retired Benefits:  Phone #:       Name: Checked in [place]. Date: 12/29/08     Deduct: $1260      Out of Pocket Max: none      Life Max: unlimited CIR: 100%      SNF: 100 days Outpatient: 80%     Co-Pay: 20% Home Health: 100%      Co-Pay: none DME: 80%     Co-Pay: 20% Providers: patient's choice  SECONDARY: [employer]      Policy#: [id number]      Subscriber: Donald [name] CM Name:        Phone#:       Fax#:   Pre-Cert#:        Employer:  Retired Benefits:  Phone #: [phone]     Name:   Eff. Date:       Deduct:        Out of Pocket Max:        Life Max:   CIR:        SNF:   Outpatient:       Co-Pay:   Home Health:        Co-Pay:   DME:       Co-Pay:     Emergency Contact Information Contact Information   Name Relation Home Work Mobile   Donald [name] Spouse [phone]     Donald [name]   [phone]     Current Medical History  Patient Admitting Diagnosis:  L AKA  History of Present Illness: A 68 y.o. right-handed male with history of CVA with left-sided weakness, HTN, PVD with multiple revascularization procedures as well his recent thrombectomy of occluded FPBG with 4 compartment fasciotomy requiring VAC 5/ 2015. He was d/c from CIR on 09/24/13 and was ambulating at supervision level with RW. He started developing increasing LLE pain with onset of numbness 10/07/13 due to recurrent thrombosis left femoral popliteal bypass. Patient has refused chronic coumadin therapy as well as removal of infected L-axillofemoral BG. High L-AKA recommended  due to level of demarcation. On 10/08/13, patient underwent L-AKA by Dr. Trula Slade and post op with severe depressive symptoms with lability as well as poor pain control. He was started on Oxycontin for more pain management as well as Toradol. Was treated with IV iron infusion for anemia. Pschiatry consulted for input and patient started on Cymbalta for severe acute depressive symptoms. PCA discontinued today. [employer] to provide retention sock for AKA.  PT/OT evaluations done and CIR recommended by Rehab team and MD. Patient to be admitted for comprehensive inpatient rehab program.    Past Medical History  Past Medical History  Diagnosis Date  . GERD (gastroesophageal reflux disease)   . Hyperlipidemia   . Hypertension   . Peripheral vascular  disease   . Internal and external bleeding hemorrhoids   . History of gout     left great toe  . Depression   . Numbness in right leg     Hx: of diabetes  . Coronary artery disease     RCA occlusion with good collaterals, o/w no obs CAD 12/2011  . Pneumonia ~ 1977  . Type II diabetes mellitus   . History of blood transfusion 12/2011; 12/2012    "before OR; after OR" (01/22/2013)  . Stroke 2008    "left side weak; unable to move left hand still" (01/22/2013)  . Rectal cancer dx'd 08/2007    S/P chemo, radiation, biopsies  . 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  Prior Rehab/Hospitalizations:  Was on CIR 05/14.   Current Medications  Current facility-administered medications:acetaminophen (TYLENOL) suppository 650 mg, 650 mg, Rectal, Q6H PRN, Rise Patience, MD;  acetaminophen (TYLENOL) tablet 650 mg, 650 mg, Oral, Q6H PRN, Rise Patience, MD;  alum & mag hydroxide-simeth (MAALOX/MYLANTA) 200-200-20 MG/5ML suspension 15-30 mL, 15-30 mL, Oral, Q2H PRN, Ulyses Amor, PA-C dipyridamole-aspirin (AGGRENOX) 200-25 MG per 12 hr capsule 1 capsule, 1 capsule, Oral, BID, Orson Eva, MD, 1 capsule at 10/13/13 0956;  docusate sodium (COLACE) capsule 100 mg, 100 mg, Oral, BID, Orson Eva, MD, 100 mg at 10/13/13 1610;  DULoxetine (CYMBALTA) DR capsule 30 mg, 30 mg, Oral, Daily, Durward Parcel, MD, 30 mg at 10/13/13 0955 enoxaparin (LOVENOX) injection 40 mg, 40 mg, Subcutaneous, Q24H, Emma M Collins, PA-C, 40 mg at 10/12/13 2129;  feeding supplement (GLUCERNA SHAKE) (GLUCERNA SHAKE) liquid 237 mL, 237 mL, Oral, Daily, Reanne [place] Pink, RD;  ferric gluconate (NULECIT) 125 mg in sodium chloride 0.9 % 100 mL IVPB, 125 mg, Intravenous, Once, Orson Eva, MD, 125 mg at 10/13/13 1103;  ferrous sulfate tablet 325 mg, 325 mg, Oral, BID WC, Orson Eva, MD guaiFENesin-dextromethorphan (ROBITUSSIN DM) 100-10 MG/5ML syrup 15 mL, 15 mL, Oral, Q4H PRN, Ulyses Amor, PA-C;  hydrALAZINE (APRESOLINE) injection 10 mg, 10 mg, Intravenous, Q4H PRN, Rise Patience, MD, 10 mg at 10/10/13 0456;  insulin aspart (novoLOG) injection 0-9 Units, 0-9 Units, Subcutaneous, TID WC, Rise Patience, MD, 2 Units at 10/13/13 9604 insulin glargine (LANTUS) injection 5 Units, 5 Units, Subcutaneous, QHS, Rise Patience, MD, 5 Units at 10/12/13 2129;  ketorolac (TORADOL) 30 MG/ML injection 15 mg, 15 mg, Intravenous, 4 times per day, Orson Eva, MD, 15 mg at 10/13/13 5409;  labetalol (NORMODYNE,TRANDATE) injection 10 mg, 10 mg, Intravenous, Q2H PRN, Ulyses Amor, PA-C;  lisinopril (PRINIVIL,ZESTRIL) tablet 2.5 mg, 2.5 mg, Oral, Daily, Orson Eva, MD, 2.5 mg at 10/13/13 8119 magnesium sulfate IVPB 2 g 50 mL, 2 g, Intravenous, Daily PRN, Ulyses Amor, PA-C;  metoprolol (LOPRESSOR) injection 2-5 mg, 2-5 mg, Intravenous, Q2H PRN, Ulyses Amor, PA-C;  morphine 2 MG/ML injection 2 mg, 2 mg, Intravenous, Q2H PRN, Samantha J Rhyne, PA-C, 2 mg at 10/09/13 1053;  multivitamin with minerals tablet 1 tablet, 1 tablet, Oral, Daily, Baird Lyons, RD, 1 tablet at 10/13/13 0956 ondansetron (ZOFRAN) injection 4 mg,  4 mg, Intravenous, Q6H PRN, Rise Patience, MD;  ondansetron (ZOFRAN) tablet 4 mg, 4 mg, Oral, Q6H PRN, Rise Patience, MD;  oxyCODONE (Oxy IR/ROXICODONE) immediate release tablet 5 mg, 5 mg, Oral, Q4H PRN, Orson Eva, MD, 5 mg at 10/09/13 0426;  OxyCODONE (  OXYCONTIN) 12 hr tablet 40 mg, 40 mg, Oral, Q12H, Orson Eva, MD, 40 mg at 10/13/13 0956 oxyCODONE-acetaminophen (PERCOCET/ROXICET) 5-325 MG per tablet 1-2 tablet, 1-2 tablet, Oral, Q4H PRN, Ulyses Amor, PA-C, 2 tablet at 10/13/13 [phone];  pantoprazole (PROTONIX) EC tablet 40 mg, 40 mg, Oral, Daily, Ulyses Amor, PA-C, 40 mg at 10/13/13 1779;  phenol (CHLORASEPTIC) mouth spray 1 spray, 1 spray, Mouth/Throat, PRN, Ulyses Amor, PA-C potassium chloride SA (K-DUR,KLOR-CON) CR tablet 20-40 mEq, 20-40 mEq, Oral, Daily PRN, Ulyses Amor, PA-C;  senna (SENOKOT) tablet 17.2 mg, 2 tablet, Oral, QHS, David Tat, MD, 17.2 mg at 10/12/13 2128;  sodium chloride 0.9 % injection 10-40 mL, 10-40 mL, Intracatheter, PRN, Orson Eva, MD, 10 mL at 10/12/13 0456;  traZODone (DESYREL) tablet 25 mg, 25 mg, Oral, QHS, Orson Eva, MD, 25 mg at 10/12/13 2129  Patients Current Diet: Carb Control  Precautions / Restrictions Precautions Precautions: Fall Precaution Comments: LT AKA wrap  Restrictions Weight Bearing Restrictions: No   Prior Activity Level Household: Mostly homebound, did not go out much.  Home Assistive Devices / Equipment Home Assistive Devices/Equipment: Cane (specify quad or straight);Eyeglasses  Prior Functional Level Prior Function Level of Independence: Needs assistance ADL's / Homemaking Assistance Needed: pt has wife (A) for adls since CIR admission  Current Functional Level Cognition  Overall Cognitive Status: Within Functional Limits for tasks assessed Orientation Level: Oriented X4    Extremity Assessment (includes Sensation/Coordination)  Upper Extremity Assessment: Defer to OT evaluation;Generalized weakness  Lower  Extremity Assessment: RLE deficits/detail;LLE deficits/detail  RLE Deficits / Details: generally weak--quads 4/5, hams hip flexors 4-/5, df/pf 3+/5  LLE Deficits / Details: painful and move little against gravity or in ab/add  Cervical / Trunk Assessment: Normal    ADLs  Overall ADL's : Needs assistance/impaired Grooming: Set up;Sitting Lower Body Bathing: Total assistance General ADL Comments: sESSION focused on education and bed level mobility. Pt very anxious with therapist arrival. pt tearful at times. Pt expressed several times during session "I just wish I would die" "it might be the chicken way out but i want to die". pt expressed extreme embarrassment with any male (A) for peri care and bathing. pt prefers all male staff when possible and understands this can not be provided for all disciplines/ LOS. pt educated OT does not currently have male therapist on staff but he could return to CIR with male (A). Pt states therapist by name from previous admission and asking about how they are doing. pt educated on the need to provided tactile input to LT LE. pt refusing to touch LT LE stating " it will hurt" " it will cause me more pain. I am not going to do that" Pt educated why this is important, demo on Rt LE and encouraged to attempt later today after he felt he was able to attempt. wife presnt and encouraging patient. Pt educated on hip neutral position and extension to prevent hip contraction in a flexion position. Pt educated on the need for pressure relife and pt declined all attempts at repositioning. Recommend mattress overlay. Pt is high risk for break down. pt demonstrates long sitting in bed using RT UE and bed rail to complete task.    Mobility  Overal bed mobility: Needs Assistance Bed Mobility: Sidelying to Sit Supine to sit: Min assist Sit to supine: Mod assist;+2 for physical assistance General bed mobility comments: Bridged to EOB with min assist; minimal assist to EOB     Transfers  Overall transfer level: Needs assistance Equipment used:  (chair back) Transfers: Sit to/from [employer] Sit to Stand: Min assist;From elevated surface;+2 safety/equipment (times 3) Squat pivot transfers: Min assist;+2 safety/equipment General transfer comment: cues for sequencign and to calm anxiety; steady assist    Ambulation / Gait / Stairs / Office manager / Balance Dynamic Sitting Balance Sitting balance - Comments: Much more calm with static sitting and scooting to EOB    Special needs/care consideration BiPAP/CPAP No CPM No Continuous Drip IV No Dialysis No        Life Vest No Oxygen No Special Bed No, but could benefit from an air overlay to assist with coccyx healing per [facility] nurse. Trach Size No Wound Vac (area) No     Skin L BKA incision.  Seen by [facility] nurse.  Please see note from her on 10/13/13 in the acute chart.  Patient has a fungal/yeast infection on his bottom area. Bowel mgmt: Last BM 10/08/13 Bladder mgmt: Using urinal, has urgency Diabetic mgmt Yes, on insulin at home    Previous Home Environment Living Arrangements: Spouse/significant other Type of Home: House Home Layout: Two level;Bed/bath upstairs Alternate Level Stairs-Rails: Right;Left Alternate Level Stairs-Number of Steps: flight, patient has lift chair Home Access: Stairs to enter Entrance Stairs-Rails: Right;Left Entrance Stairs-Number of Steps: 16 steps in the front; 1 in the back (usually enter in the back) Bathroom Shower/Tub: Walk-in shower;Door Home Care Services: No Additional Comments: Pt has a stair chair lift now for going upstairs.   Discharge Living Setting Plans for Discharge Living Setting: Patient's home;House;Lives with (comment) (Lives with wife.  Has no children.) Type of Home at Discharge: House Discharge Home Layout: Two level;Bed/bath upstairs Alternate Level Stairs-Number of Steps: Flight Discharge Home Access: Stairs to  enter Entrance Stairs-Number of Steps: 16 steps front entrance and 1 step at back entry into kitchen. Does the patient have any problems obtaining your medications?: No  Social/Family/Support Systems Patient Roles: Spouse Contact Information: Vamsi Apfel - wife [phone] Anticipated Caregiver: wife Bethena Roys Ability/Limitations of Caregiver: Wife assists with ADLs and can act as caregiver as she has been doing PTA. Caregiver Availability: 24/7 Discharge Plan Discussed with Primary Caregiver: Yes Is Caregiver In Agreement with Plan?: Yes Does Caregiver/Family have Issues with Lodging/Transportation while Pt is in Rehab?: No  Goals/Additional Needs Patient/Family Goal for Rehab: PT/OT min A, no ST needs Expected length of stay: 10-14 days Cultural Considerations: None Dietary Needs: Carb mod med cal, thin liquids Equipment Needs: TBD Pt/Family Agrees to Admission and willing to participate: Yes Program Orientation Provided & Reviewed with Pt/Caregiver Including Roles  & Responsibilities: Yes  Decrease burden of Care through IP rehab admission: N/A  Possible need for SNF placement upon discharge: Not planned  Patient Condition: This patient's medical and functional status has changed since the consult dated: 10/09/13 in which the Rehabilitation Physician determined and documented that the patient's condition is appropriate for intensive rehabilitative care in an inpatient rehabilitation facility. See "History of Present Illness" (above) for medical update. Functional changes are: PCA discontinued today.  Currently transferring min assist +2. Patient's medical and functional status update has been discussed with the Rehabilitation physician and patient remains appropriate for inpatient rehabilitation. Will admit to inpatient rehab today.  Preadmission Screen Completed By:  [name], 10/13/2013 12:00 PM ______________________________________________________________________    Discussed status with Dr. Naaman Plummer on 10/13/13 at 1259 and received telephone approval for admission today.  Admission Coordinator:  Karl Bales,  Evalee Mutton, time1259/Date06/16/15

## 2013-10-13 NOTE — Progress Notes (Signed)
Physical Medicine and Rehabilitation Consult  Reason for Consult: Left AKA  Referring Physician: [facility]  HPI: Donald [name] is a 68 y.o. right-handed male with history of CVA with left-sided weakness, hypertension, peripheral vascular disease with multiple revascularization procedures received CIR 2014 after revascularization procedure as well his recent thrombectomy of occluded femoropopliteal bypass fasciotomy requiring VAC May of 2015 and again received inpatient rehabilitation services at that time.. Patient has been using a cane at home since recent discharge from rehabilitation services with assistance of his wife and receiving home therapies. Presented 10/07/2013 a severely ischemic left lower extremity with increasing pain. Limb was felt to be non-salvageable. 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 Trula Slade. Postoperative pain management. Subcutaneous Lovenox added for DVT prophylaxis. Patient remains on Aggrenox for history of CVA. Physical and occupational therapy evaluations pending. M.D. has requested physical medicine rehabilitation consult.  Patient feels very gown today. He states that this has been tougher than he thought. Severe postoperative pain.  Review of Systems  Gastrointestinal: Positive for constipation.  GERD  Neurological:  Left side weakness after CVA  Psychiatric/Behavioral: Positive for depression.  All other systems reviewed and are negative.   Past Medical History   Diagnosis  Date   .  GERD (gastroesophageal reflux disease)    .  Hyperlipidemia    .  Hypertension    .  Peripheral vascular disease    .  Internal and external bleeding hemorrhoids    .  History of gout      left great toe   .  Depression    .  Numbness in right leg      Hx: of diabetes   .  Coronary artery disease      RCA occlusion with good collaterals, o/w no obs CAD 12/2011   .  Pneumonia  ~ 1977   .  Type II diabetes mellitus    .  History of blood transfusion  12/2011; 12/2012    "before OR; after OR" (01/22/2013)   .  Stroke  2008     "left side weak; unable to move left hand still" (01/22/2013)   .  Rectal cancer  dx'd 08/2007     S/P chemo, radiation, biopsies   .  Metastases to the liver  dx'd 08/2010    Past Surgical History   Procedure  Laterality  Date   .  Carotid endarterectomy  Right  2008   .  Liver cryoablation   08/2010     "chemo shrunk the cancer 50% then they went in and burned the rest of it"   .  Axillary-femoral bypass graft   01/29/2012     Procedure: BYPASS GRAFT AXILLA-BIFEMORAL; Surgeon: Elam Dutch, MD; Location: [facility]; Service: Vascular; Laterality: N/A; Left axilla-bifemoral bypass using gore-tex graft.   .  Colonoscopy w/ biopsies and polypectomy       Hx: of   .  Femoral-popliteal bypass graft  Left  01/06/2013     Procedure: Femoral-Popliteal Artery Bypass; Surgeon: Elam Dutch, MD; Location: [place]; Service: Vascular; Laterality: Left;   .  Intraoperative arteriogram  Left  01/06/2013     Procedure: INTRA OPERATIVE ARTERIOGRAM; Surgeon: Elam Dutch, MD; Location: [place]; Service: Vascular; Laterality: Left;   .  Femoral-popliteal bypass graft  Left  01/10/2013     Procedure: THROMBECTOMY POSSIBLE BYPASS GRAFT FEMORAL-POPLITEAL ARTERY; Surgeon: Mal Misty, MD; Location: [place]; Service: Vascular; Laterality: Left; Attempted Thrombectomy of left Femoral/Popliteal graft - Unsuccessful. Insertion  of Left femoral to below the knee popliteal propaten graft.   .  Mass biopsy   2009     "found mass that was cancer; later had chemo and radiation; never cut mass out" (01/22/2013)   .  Femoral-popliteal bypass graft  Left  09/05/2013     Procedure: Thrombectomy of Left Femoral-Below Knee Popliteal Bypass Graft; Patch Angioplasty of Distal Portion of Bypass Graft; Surgeon: Angelia Mould, MD; Location: [place]; Service: Vascular; Laterality: Left;   .  Patch angioplasty  Left  09/05/2013     Procedure: Patch Angioplasty Distal Portion of Left  Femoral-Below Knee Popliteal Bypass Graft; Surgeon: Angelia Mould, MD; Location: [place]; Service: Vascular; Laterality: Left;   .  Intraoperative arteriogram  Left  09/05/2013     Procedure: INTRA OPERATIVE ARTERIOGRAM; Surgeon: Angelia Mould, MD; Location: [place]; Service: Vascular; Laterality: Left;   .  Fasciotomy  Left  09/05/2013     Procedure: FASCIOTOMY; Surgeon: Angelia Mould, MD; Location: [place]; Service: Vascular; Laterality: Left;   Marland Kitchen  Vascular surgery     .  Fasciotomy closure  Left  09/11/2013     Procedure: CLOSURE OF FASCIOTOMY SITES LEFT LOWER EXTREMITY AND WOUND VAC APPLICATION; Surgeon: Angelia Mould, MD; Location: [facility] OR; Service: Vascular; Laterality: Left;    Family History   Problem  Relation  Age of Onset   .  Cancer - Other  Sister     Social History: reports that he has quit smoking. His smoking use included Cigarettes. He has a 5.7 pack-year smoking history. He has never used smokeless tobacco. He reports that he does not drink alcohol or use illicit drugs.  Allergies:  Allergies   Allergen  Reactions   .  Other      Became hypotensive. Pt states allergic to "muscle relaxer" but unsure of name.   .  Tape  Hives and Itching     medfix non woven tape    Medications Prior to Admission   Medication  Sig  Dispense  Refill   .  Ascorbic Acid (VITAMIN C) 1000 MG tablet  Take 500 mg by mouth daily.     .  cephALEXin (KEFLEX) 500 MG capsule  Take 1 capsule (500 mg total) by mouth 4 (four) times daily.  120 capsule  0   .  dipyridamole-aspirin (AGGRENOX) 200-25 MG per 12 hr capsule  Take 1 capsule by mouth 2 (two) times daily.     Marland Kitchen  docusate sodium (COLACE) 100 MG capsule  Take 200 mg by mouth 2 (two) times daily as needed for mild constipation.     .  ferrous fumarate (HEMOCYTE - 106 MG FE) 325 (106 FE) MG TABS tablet  Take 1 tablet (106 mg of iron total) by mouth 2 (two) times daily.  60 each  1   .  gabapentin (NEURONTIN) 300 MG capsule  Take 1  capsule (300 mg total) by mouth 3 (three) times daily.  90 capsule  6   .  insulin detemir (LEVEMIR) 100 UNIT/ML injection  Inject 10 Units into the skin daily.     Marland Kitchen  lisinopril (PRINIVIL,ZESTRIL) 5 MG tablet  Take 2.5 mg by mouth daily.     .  metFORMIN (GLUCOPHAGE) 500 MG tablet  Take 500 mg by mouth 2 (two) times daily with a meal.     .  naproxen sodium (ANAPROX) 220 MG tablet  Take 220 mg by mouth daily as needed (pain).     Marland Kitchen  omega-3 acid ethyl esters (LOVAZA) 1 G capsule  Take 2 g by mouth 2 (two) times daily.     Marland Kitchen  omeprazole (PRILOSEC) 40 MG capsule  Take 40 mg by mouth daily.     Marland Kitchen  oxyCODONE (OXY IR/ROXICODONE) 5 MG immediate release tablet  Take 1-2 tablets (5-10 mg total) by mouth every 6 (six) hours as needed for severe pain.  120 tablet  0   .  OxyCODONE (OXYCONTIN) 20 mg T12A 12 hr tablet  Take 1 tablet (20 mg total) by mouth every 12 (twelve) hours. Long acting pain medication.  60 tablet  0   .  oxyCODONE-acetaminophen (PERCOCET/ROXICET) 5-325 MG per tablet  Take 1 tablet by mouth every 4 (four) hours as needed for moderate pain or severe pain.     Marland Kitchen  saccharomyces boulardii (FLORASTOR) 250 MG capsule  Take 1 capsule (250 mg total) by mouth 2 (two) times daily. probiotic  60 capsule  1   .  senna-docusate (SENOKOT-S) 8.6-50 MG per tablet  Take 2 tablets by mouth 2 (two) times daily. For constipation.     .  traMADol (ULTRAM) 50 MG tablet  Take 1 tablet (50 mg total) by mouth every 6 (six) hours as needed for moderate pain.  45 tablet  0   .  traZODone (DESYREL) 50 MG tablet  Take 0.5-1 tablets (25-50 mg total) by mouth at bedtime as needed for sleep.  30 tablet  0    Home:  Home Living  Family/patient expects to be discharged to:: Private residence  Living Arrangements: Spouse/significant other  Functional History:   Functional Status:  Mobility:      ADL:   Cognition:  Cognition  Orientation Level: Oriented X4   Blood pressure 164/53, pulse 81, temperature 97.4  F (36.3 C), temperature source Oral, resp. rate 20, height 5\' 9"  (1.753 m), weight 75.297 kg (166 lb), SpO2 97.00%.  Physical Exam  Vitals reviewed.  HENT:  Head: Normocephalic.  Eyes: EOM are normal.  Neck: Normal range of motion. Neck supple. No thyromegaly present.  Cardiovascular: Normal rate and regular rhythm.  Respiratory: Effort normal and breath sounds normal. No respiratory distress.  GI: Soft. Bowel sounds are normal. He exhibits no distension.  Neurological: He is alert.  Speech is a bit dysarthric but intelligible. He was oriented x3. He would easily become tearful during exam. Patient followed commands  Skin:  Amputation site is dressed and appropriately tender  upper extremity strength is 5/5 in the deltoid, bicep, tricep on the right and 4-/5 in the deltoid, bicep, tricep on the left  Left hip flexor not tested secondary to pain  Right lower extremity 4/5 and hip flexor knee extensor ankle dorsiflexor plantar flexor  Results for orders placed during the hospital encounter of 10/07/13 (from the past 24 hour(s))   GLUCOSE, CAPILLARY Status: Abnormal    Collection Time    10/08/13 8:08 AM   Result  Value  Ref Range    Glucose-Capillary  115 (*)  70 - 99 mg/dL   GLUCOSE, CAPILLARY Status: Abnormal    Collection Time    10/08/13 12:00 PM   Result  Value  Ref Range    Glucose-Capillary  134 (*)  70 - 99 mg/dL    Comment 1  Notify RN    GLUCOSE, CAPILLARY Status: Abnormal    Collection Time    10/08/13 4:49 PM   Result  Value  Ref Range    Glucose-Capillary  142 (*)  70 - 99 mg/dL    Comment 1  Notify RN    GLUCOSE, CAPILLARY Status: Abnormal    Collection Time    10/08/13 7:55 PM   Result  Value  Ref Range    Glucose-Capillary  145 (*)  70 - 99 mg/dL    Comment 1  Notify RN    GLUCOSE, CAPILLARY Status: Abnormal    Collection Time    10/08/13 11:45 PM   Result  Value  Ref Range    Glucose-Capillary  137 (*)  70 - 99 mg/dL    Comment 1  Notify RN    GLUCOSE,  CAPILLARY Status: Abnormal    Collection Time    10/09/13 4:51 AM   Result  Value  Ref Range    Glucose-Capillary  120 (*)  70 - 99 mg/dL    Comment 1  Notify RN     No results found.  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 Does the need for close, 24 hr/day medical supervision in concert with the patient's rehab needs make it unreasonable for this patient to be served in a less intensive setting? Potentially 2. Co-Morbidities requiring supervision/potential complications: Diabetes, hypertension, anemia, chronic mild left hemiparesis 3. Due to bladder management, bowel management, safety, skin/wound care, disease management, medication administration and pain management, does the patient require 24 hr/day rehab nursing? Potentially 4. Does the patient require coordinated care of a physician, rehab nurse, PT (1-2 hrs/day, 5 days/week) and OT (1-2 hrs/day, 5 days/week) to address physical and functional deficits in the context of the above medical diagnosis(es)? Potentially Addressing deficits in the following areas: balance, endurance, locomotion, strength, transferring, bowel/bladder control, bathing, dressing, feeding, grooming and toileting 5. Can the patient actively participate in an intensive therapy program of at least 3 hrs of therapy per day at least 5 days per week? No 6. The potential for patient to make measurable gains while on inpatient rehab is fair 7. Anticipated functional outcomes upon discharge from inpatient rehab are min assist with PT, min assist with OT, n/a with SLP. 8. Estimated rehab length of stay to reach the above functional goals is: 10-14 days 9. Does the patient have adequate social supports to accommodate these discharge functional goals? Potentially 10. Anticipated D/C setting: Home 11. Anticipated post D/C treatments: [place] therapy 12. Overall Rehab/Functional Prognosis: fair RECOMMENDATIONS:  This patient's condition is appropriate for  continued rehabilitative care in the following setting: Should be ready for CIR once he is able to tolerate PT and OT from a pain management standpoint  Patient has agreed to participate in recommended program. Potentially  Note that insurance prior authorization may be required for reimbursement for recommended care.  Comment: Patient also emotionally distressed with poor adjustment thus far.  10/09/2013  Revision History...      Date/Time User Action    10/09/2013 4:06 PM Charlett Blake, MD Sign    10/09/2013 6:20 AM Cathlyn Parsons, PA-C Pend   View Details Report    Routing History.Marland KitchenMarland Kitchen

## 2013-10-14 ENCOUNTER — Inpatient Hospital Stay (HOSPITAL_COMMUNITY): Payer: Self-pay | Admitting: Rehabilitation

## 2013-10-14 ENCOUNTER — Encounter (HOSPITAL_COMMUNITY): Payer: Self-pay

## 2013-10-14 ENCOUNTER — Inpatient Hospital Stay (HOSPITAL_COMMUNITY): Payer: Self-pay

## 2013-10-14 DIAGNOSIS — L98499 Non-pressure chronic ulcer of skin of other sites with unspecified severity: Secondary | ICD-10-CM

## 2013-10-14 DIAGNOSIS — I739 Peripheral vascular disease, unspecified: Secondary | ICD-10-CM

## 2013-10-14 DIAGNOSIS — S78119A Complete traumatic amputation at level between unspecified hip and knee, initial encounter: Secondary | ICD-10-CM

## 2013-10-14 DIAGNOSIS — G8110 Spastic hemiplegia affecting unspecified side: Secondary | ICD-10-CM

## 2013-10-14 DIAGNOSIS — E119 Type 2 diabetes mellitus without complications: Secondary | ICD-10-CM

## 2013-10-14 LAB — CBC WITH DIFFERENTIAL/PLATELET
Basophils Absolute: 0 10*3/uL (ref 0.0–0.1)
Basophils Relative: 0 % (ref 0–1)
Eosinophils Absolute: 0 10*3/uL (ref 0.0–0.7)
Eosinophils Relative: 0 % (ref 0–5)
HCT: 26.5 % — ABNORMAL LOW (ref 39.0–52.0)
Hemoglobin: 8.2 g/dL — ABNORMAL LOW (ref 13.0–17.0)
Lymphocytes Relative: 11 % — ABNORMAL LOW (ref 12–46)
Lymphs Abs: 0.7 10*3/uL (ref 0.7–4.0)
MCH: 26 pg (ref 26.0–34.0)
MCHC: 30.9 g/dL (ref 30.0–36.0)
MCV: 84.1 fL (ref 78.0–100.0)
Monocytes Absolute: 0.5 10*3/uL (ref 0.1–1.0)
Monocytes Relative: 8 % (ref 3–12)
Neutro Abs: 5.5 10*3/uL (ref 1.7–7.7)
Neutrophils Relative %: 81 % — ABNORMAL HIGH (ref 43–77)
Platelets: 298 10*3/uL (ref 150–400)
RBC: 3.15 MIL/uL — ABNORMAL LOW (ref 4.22–5.81)
RDW: 23.2 % — ABNORMAL HIGH (ref 11.5–15.5)
WBC Morphology: INCREASED
WBC: 6.7 10*3/uL (ref 4.0–10.5)

## 2013-10-14 LAB — GLUCOSE, CAPILLARY
GLUCOSE-CAPILLARY: 136 mg/dL — AB (ref 70–99)
Glucose-Capillary: 134 mg/dL — ABNORMAL HIGH (ref 70–99)
Glucose-Capillary: 116 mg/dL — ABNORMAL HIGH (ref 70–99)
Glucose-Capillary: 151 mg/dL — ABNORMAL HIGH (ref 70–99)

## 2013-10-14 LAB — COMPREHENSIVE METABOLIC PANEL
ALBUMIN: 2.1 g/dL — AB (ref 3.5–5.2)
ALT: 13 U/L (ref 0–53)
AST: 35 U/L (ref 0–37)
Alkaline Phosphatase: 83 U/L (ref 39–117)
BUN: 20 mg/dL (ref 6–23)
CALCIUM: 8.5 mg/dL (ref 8.4–10.5)
CO2: 27 mEq/L (ref 19–32)
Chloride: 100 mEq/L (ref 96–112)
Creatinine, Ser: 1.04 mg/dL (ref 0.50–1.35)
GFR calc non Af Amer: 71 mL/min — ABNORMAL LOW (ref >90)
GFR, EST AFRICAN AMERICAN: 83 mL/min — AB (ref >90)
Glucose, Bld: 139 mg/dL — ABNORMAL HIGH (ref 70–99)
Potassium: 4.9 mEq/L (ref 3.7–5.3)
SODIUM: 138 meq/L (ref 137–147)
Total Bilirubin: 0.4 mg/dL (ref 0.3–1.2)
Total Protein: 5.9 g/dL — ABNORMAL LOW (ref 6.0–8.3)

## 2013-10-14 MED ORDER — SODIUM CHLORIDE 0.9 % IJ SOLN
10.0000 mL | INTRAMUSCULAR | Status: DC | PRN
  Administered 2013-10-14 – 2013-10-26 (×6): 10 mL

## 2013-10-14 MED ORDER — BOOST / RESOURCE BREEZE PO LIQD: 1.0000 | Freq: Three times a day (TID) | ORAL | Status: DC

## 2013-10-14 MED ORDER — BOOST / RESOURCE BREEZE PO LIQD
1.0000 | Freq: Two times a day (BID) | ORAL | Status: DC
  Administered 2013-10-14 – 2013-10-16 (×3): 1 via ORAL

## 2013-10-14 NOTE — Progress Notes (Signed)
Patient information reviewed and entered into eRehab system by Marie Noel, RN, CRRN, PPS Coordinator.  Information including medical coding and functional independence measure will be reviewed and updated through discharge.     Per nursing patient was given "Data Collection Information Summary for Patients in Inpatient Rehabilitation Facilities with attached "Privacy Act Statement-Health Care Records" upon admission.  

## 2013-10-14 NOTE — Progress Notes (Signed)
Physical Therapy Session Note  Patient Details  Name: Donald [name] MRN: [id number] Date of Birth: 21-Jun-1945  Today's Date: 10/14/2013 Time: 9357-0177 Time Calculation (min): 28 min  Short Term Goals: Week 1:  PT Short Term Goal 1 (Week 1): Pt will perform bed mobility with S with HOB flat and without bed rails PT Short Term Goal 2 (Week 1): Pt will perform dynamic standing x 3 mins at min A level with RUE supported PT Short Term Goal 3 (Week 1): Pt will self propel w/c x 150' using R hemi technique at S level PT Short Term Goal 4 (Week 1): Pt will verbalize pressure relief techniques with min cues  Skilled Therapeutic Interventions/Progress Updates:   Pt received lying in bed asleep, but easily aroused with voice and agreeable to session.  Pt performed bed mobility at min A with HOB flat and with handrails.  Min A for scooting hips to EOB with cues for increased trunk lean when performing scooting.  Transferred to w/c via stand pivot transfer with mod A.  Pt self propelled to/from therapy gym using R hemi technique at close S to min A with mild inattention to the L noted.  Once in therapy gym, focused on standing tolerance and quality in // bars x 3 reps at mod to heavy min A.  Then worked on mini squats on RLE, however pt with increased compensation on R elbow.  Cues for pursed lip breathing and increased glute activation with increased knee ext.  Pt performed well, however continues to be "down" on himself.  Provided max encouragement during session.  Propelled back to room and re-educated on pressure relief, however will need continued cues for increased carryover.  Left in w/c with all needs in reach.    Maintained on 3L O2 throughout session in 90's.   Therapy Documentation Precautions:  Precautions Precautions: Fall Precaution Comments: LT AKA wrap, pt is anxious with mobility Restrictions Weight Bearing Restrictions: No Vital Signs: Therapy Vitals Temp: 97.4 F (36.3  C) Temp src: Oral Pulse Rate: 89 Resp: 18 BP: 149/74 mmHg Patient Position (if appropriate): Lying Oxygen Therapy SpO2: 91 % O2 Device: Nasal cannula O2 Flow Rate (L/min): 3.5 L/min Pain: Mild pain in buttocks, but improved from morning session.    Locomotion : Wheelchair Mobility Distance: 100   See FIM for current functional status  Therapy/Group: Individual Therapy  Denice Bors 10/14/2013, 4:24 PM

## 2013-10-14 NOTE — Patient Care Conference (Signed)
Inpatient RehabilitationTeam Conference and Plan of Care Update Date: 10/14/2013   Time: 10;50 AM    Patient Name: Donald [name]      Medical Record Number: [id number]  Date of Birth: 06-28-1945 Sex: Male         Room/Bed: 4W23C/4W23C-01 Payor Info: Payor: MEDICARE / Plan: MEDICARE PART A AND B / Product Type: *No Product type* /    Admitting Diagnosis: L AKA OLD CVA  Admit Date/Time:  10/13/2013  4:55 PM Admission Comments: No comment available   Primary Diagnosis:  <principal problem not specified> Principal Problem: <principal problem not specified>  Patient Active Problem List   Diagnosis Date Noted  . S/P AKA (above knee amputation) 10/13/2013  . Uncontrolled pain 10/10/2013  . Type II or unspecified type diabetes mellitus with peripheral circulatory disorders, uncontrolled(250.72) 10/10/2013  . Protein-calorie malnutrition, severe 10/09/2013  . Diabetes mellitus with circulatory complication 78/24/2353  . HTN (hypertension) 10/07/2013  . Anemia 10/07/2013  . Ischemia of extremity 10/07/2013  . Ischemic leg 09/05/2013  . Wound discharge 04/09/2013  . Hemiparesis affecting left side as late effect of stroke 01/05/2013  . Abnormality of gait 09/03/2012  . Other acquired deformity of ankle and foot(736.79) 09/03/2012  . Aftercare following surgery of the circulatory system, [place] 05/15/2012  . 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 Peripheral vascular disease, unspecified 03/06/2012  . PVD (peripheral vascular disease) 02/21/2012  . Occlusion and stenosis of carotid artery without mention of cerebral infarction 02/21/2012  . Physical deconditioning 01/31/2012  . Atherosclerotic peripheral vascular disease with rest pain 01/24/2012  . Preoperative evaluation to rule out surgical contraindication 01/24/2012  . Anal carcinoma 10/30/2011  . Anal cancer 02/26/2011  . [place] MALIGNANT NEOPLASM OF LARGE INTESTINE&RECTUM 11/09/2008   . NAUSEA AND VOMITING 11/09/2008  . CONSTIPATION 08/18/2008  . RECTAL BLEEDING 08/18/2008  . ABDOMINAL PAIN -GENERALIZED 08/18/2008  . DM 08/17/2008  . HYPERLIPIDEMIA 08/17/2008  . HEMORRHOIDS 08/17/2008  . CONSTIPATION, CHRONIC 08/17/2008  . CEREBROVASCULAR ACCIDENT, HX OF 08/17/2008    Expected Discharge Date: Expected Discharge Date: 10/27/13  Team Members Present: Physician leading conference: Dr. Alysia Penna Social Worker Present: Ovidio Kin, LCSW Nurse Present: Elliot Cousin, RN PT Present: Cameron Sprang, PT OT Present: Antony Salmon, OT SLP Present: Other (comment);Gunnar Fusi, SLP Elmyra Ricks Page-SP) PPS Coordinator present : Ileana Ladd, Lelan Pons, RN, [facility]     Current Status/Progress Goal Weekly Team Focus  Medical   depressed on meds seen by Psych  maintain positive outlook  skin management   Bowel/Bladder   Continent of bowel and bladder, LBM 10/08/2013, Senna S 2 tab bid, Colace 100mg  biId  remain continent of bowel and bladder, remain free of skin break down and infection  Assess routine bowel movements daily, treat constipation.   Swallow/Nutrition/ Hydration     wfl        ADL's     eval pending        Mobility     eval pending        Communication     WFL        Safety/Cognition/ Behavioral Observations    no unsafe behaviors        Pain   Pain managed with Oxy IR 10 mg  Pain <3  Assess pain q shift and prn, keep pt pain level at or < 3   Skin     monitoring skin-mattress over lay for bed   WOC eval   provide  skin dressing and care     *See Care Plan and progress notes for long and short-term goals.  Barriers to Discharge: skin issues, potential for open lesions    Possible Resolutions to Barriers:  cont local and systemic care of peri rash    Discharge Planning/Teaching Needs:    Home with wife who can provide assistance and was prior to admission     Team Discussion:  Skin issues has overlay mattress. New eval in all  areas.  Sleeping better, would benefit from Neuro-psych seeing him while here.  Revisions to Treatment Plan:  New eval   Continued Need for Acute Rehabilitation Level of Care: The patient requires daily medical management by a physician with specialized training in physical medicine and rehabilitation for the following conditions: Daily direction of a multidisciplinary physical rehabilitation program to ensure safe treatment while eliciting the highest outcome that is of practical value to the patient.: Yes Daily medical management of patient stability for increased activity during participation in an intensive rehabilitation regime.: Yes Daily analysis of laboratory values and/or radiology reports with any subsequent need for medication adjustment of medical intervention for : Neurological problems;Other;Post surgical problems  Elease Hashimoto 10/15/2013, 8:42 AM

## 2013-10-14 NOTE — Evaluation (Signed)
Physical Therapy Assessment and Plan  Patient Details  Name: Donald [name] MRN: [id number] Date of Birth: 21-Oct-1945  PT Diagnosis: Abnormal posture, Abnormality of gait, Cognitive deficits, Difficulty walking, Hemiparesis non-dominant, Hypertonia and Muscle weakness (many are from previous CVA) Rehab Potential: Good ELOS: 14-16 days   Today's Date: 10/14/2013 Time: 4914-7607 Time Calculation (min): 61 min  Problem List:  Patient Active Problem List   Diagnosis Date Noted  . S/P AKA (above knee amputation) 10/13/2013  . Uncontrolled pain 10/10/2013  . Type II or unspecified type diabetes mellitus with peripheral circulatory disorders, uncontrolled(250.72) 10/10/2013  . Protein-calorie malnutrition, severe 10/09/2013  . Diabetes mellitus with circulatory complication 10/08/2013  . HTN (hypertension) 10/07/2013  . Anemia 10/07/2013  . Ischemia of extremity 10/07/2013  . Ischemic leg 09/05/2013  . Wound discharge 04/09/2013  . Hemiparesis affecting left side as late effect of stroke 01/05/2013  . Abnormality of gait 09/03/2012  . Other acquired deformity of ankle and foot(736.79) 09/03/2012  . Aftercare following surgery of the circulatory system, NEC 05/15/2012  . 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 Peripheral vascular disease, unspecified 03/06/2012  . PVD (peripheral vascular disease) 02/21/2012  . Occlusion and stenosis of carotid artery without mention of cerebral infarction 02/21/2012  . Physical deconditioning 01/31/2012  . Atherosclerotic peripheral vascular disease with rest pain 01/24/2012  . Preoperative evaluation to rule out surgical contraindication 01/24/2012  . Anal carcinoma 10/30/2011  . Anal cancer 02/26/2011  . SEC MALIGNANT NEOPLASM OF LARGE INTESTINE&RECTUM 11/09/2008  . NAUSEA AND VOMITING 11/09/2008  . CONSTIPATION 08/18/2008  . RECTAL BLEEDING 08/18/2008  . ABDOMINAL PAIN -GENERALIZED  08/18/2008  . DM 08/17/2008  . HYPERLIPIDEMIA 08/17/2008  . HEMORRHOIDS 08/17/2008  . CONSTIPATION, CHRONIC 08/17/2008  . CEREBROVASCULAR ACCIDENT, HX OF 08/17/2008    Past Medical History:  Past Medical History  Diagnosis Date  . GERD (gastroesophageal reflux disease)   . Hyperlipidemia   . Hypertension   . Peripheral vascular disease   . Internal and external bleeding hemorrhoids   . History of gout     left great toe  . Depression   . Numbness in right leg     Hx: of diabetes  . Coronary artery disease     RCA occlusion with good collaterals, o/w no obs CAD 12/2011  . Pneumonia ~ 1977  . Type II diabetes mellitus   . History of blood transfusion 12/2011; 12/2012    "before OR; after OR" (01/22/2013)  . Stroke 2008    "left side weak; unable to move left hand still" (01/22/2013)  . Rectal cancer dx'd 08/2007    S/P chemo, radiation, biopsies  . Metastases to the liver dx'd 08/2010   Past Surgical History:  Past Surgical History  Procedure Laterality Date  . Carotid endarterectomy Right 2008  . Liver cryoablation  08/2010    "chemo shrunk the cancer 50% then they went in and burned the rest of it"  . Axillary-femoral bypass graft  01/29/2012    Procedure: BYPASS GRAFT AXILLA-BIFEMORAL;  Surgeon: Sherren Kerns, MD;  Location: [facility] OR;  Service: Vascular;  Laterality: N/A;  Left axilla-bifemoral bypass using gore-tex graft.   . Colonoscopy w/ biopsies and polypectomy      Hx: of  . Femoral-popliteal bypass graft Left 01/06/2013    Procedure: Femoral-Popliteal Artery Bypass;  Surgeon: Sherren Kerns, MD;  Location: [facility] OR;  Service: Vascular;  Laterality: Left;  . Intraoperative arteriogram Left 01/06/2013    Procedure:  INTRA OPERATIVE ARTERIOGRAM;  Surgeon: Elam Dutch, MD;  Location: [place];  Service: Vascular;  Laterality: Left;  . Femoral-popliteal bypass graft Left 01/10/2013    Procedure: THROMBECTOMY POSSIBLE BYPASS GRAFT FEMORAL-POPLITEAL ARTERY;  Surgeon: Mal Misty, MD;  Location: [place];  Service: Vascular;  Laterality: Left;  Attempted Thrombectomy of left Femoral/Popliteal graft - Unsuccessful.  Insertion of Left femoral to below the knee popliteal propaten graft.  . Mass biopsy  2009    "found mass that was cancer; later had chemo and radiation; never cut mass out" (01/22/2013)  . Femoral-popliteal bypass graft Left 09/05/2013    Procedure: Thrombectomy of Left Femoral-Below Knee Popliteal Bypass Graft; Patch Angioplasty of Distal Portion of Bypass Graft;  Surgeon: Angelia Mould, MD;  Location: [place];  Service: Vascular;  Laterality: Left;  . Patch angioplasty Left 09/05/2013    Procedure: Patch Angioplasty Distal Portion of Left Femoral-Below Knee Popliteal Bypass Graft;  Surgeon: Angelia Mould, MD;  Location: [place];  Service: Vascular;  Laterality: Left;  . Intraoperative arteriogram Left 09/05/2013    Procedure: INTRA OPERATIVE ARTERIOGRAM;  Surgeon: Angelia Mould, MD;  Location: [place];  Service: Vascular;  Laterality: Left;  . Fasciotomy Left 09/05/2013    Procedure: FASCIOTOMY;  Surgeon: Angelia Mould, MD;  Location: [place];  Service: Vascular;  Laterality: Left;  Marland Kitchen Vascular surgery    . Fasciotomy closure Left 09/11/2013    Procedure: CLOSURE OF FASCIOTOMY SITES LEFT LOWER EXTREMITY AND WOUND VAC APPLICATION;  Surgeon: Angelia Mould, MD;  Location: [place];  Service: Vascular;  Laterality: Left;  . Amputation Left 10/08/2013    Procedure: LEFT ABOVE KNEE AMPUTATION;  Surgeon: Serafina Mitchell, MD;  Location: [facility] OR;  Service: Vascular;  Laterality: Left;    Assessment & Plan Clinical Impression: Patient is a 68 y.o. year old male with history of CVA with left-sided weakness, HTN, PVD with multiple revascularization procedures as well his recent thrombectomy of occluded FPBG with 4 compartment fasciotomy requiring VAC 5/ 2015. He was d/c from CIR on 09/24/13 and was ambulating at supervision level with RW. He started  developing increasing LLE pain with onset of numbness 10/07/13 due to recurrent thrombosis left femoral popliteal bypass. Patient has refused chronic coumadin therapy as well as removal of infected L-axillofemoral BG. High L-AKA recommended due to level of demarcation. On 10/08/13, patient underwent L-AKA by Dr. Trula Slade and post op with severe depressive symptoms with lability as well as poor pain control. He was started on Oxycontin for more pain management as well as Toradol. Was treated with IV iron infusion for anemia. Pschiatry consulted for input and patient started on Cymbalta for severe acute depressive symptoms.  Patient transferred to CIR on 10/13/2013 .   Patient currently requires mod with mobility secondary to muscle weakness, decreased cardiorespiratoy endurance and decreased attention, decreased problem solving and decreased memory.  Prior to hospitalization, patient was supervision with mobility and lived with Spouse in a House home.  Home access is 16 steps in the front; 1 in the back (usually enter in the back)Stairs to enter.  Patient will benefit from skilled PT intervention to maximize safe functional mobility, minimize fall risk and decrease caregiver burden for planned discharge home with 24 hour supervision.  Anticipate patient will benefit from follow up [place] at discharge.  PT - End of Session Activity Tolerance: Tolerates 10 - 20 min activity with multiple rests Endurance Deficit: Yes PT Assessment Rehab Potential: Good PT Patient  demonstrates impairments in the following area(s): Balance;Endurance;Motor;Safety;Skin Integrity PT Transfers Functional Problem(s): Bed Mobility;Bed to Chair;Car;Furniture PT Locomotion Functional Problem(s): Wheelchair Mobility;Ambulation (ambulation for short distance, may not set goal) PT Plan PT Intensity: Minimum of 1-2 x/day ,45 to 90 minutes PT Frequency: 5 out of 7 days PT Duration Estimated Length of Stay: 14-16 days PT  Treatment/Interventions: Ambulation/gait training;Balance/vestibular training;Cognitive remediation/compensation;Discharge planning;DME/adaptive equipment instruction;Functional mobility training;Patient/family education;Skin care/wound management;Therapeutic Activities;Therapeutic Exercise;UE/LE Strength taining/ROM;Wheelchair propulsion/positioning PT Transfers Anticipated Outcome(s): S PT Locomotion Anticipated Outcome(s): mod I w/ w/c mobility, min A for short distances PT Recommendation Recommendations for Other Services: Neuropsych consult Follow Up Recommendations: Home health PT Patient destination: Home Equipment Recommended: To be determined;Wheelchair (measurements);Wheelchair cushion (measurements)  Skilled Therapeutic Intervention PT assessment and evaluation performed.  See details below.  Eval limited due to pts skin issues.  Note that on sacrum and perineum had multiple areas of breakdown.  Wound nurse on unit called to assess.  MD and RN both assessed.  Alleyvan patch donned to sacrum.  Remainder of session focused on bed mobility with cues and assist for scooting to EOB to prevent shear.  Performed standing x 2-3 mins with mod progressing to max assist with RW, however LUE unable to grasp RW due to increased tone.  Transferred to w/c with mod assist stand pivot.   Provided pt with J2 cushion to assist with pressure relief.  Also discussed pressure relief via forward lean and lateral leans.   Also had pt practice for increased carryover.  Discussed to perform every hour for 1 min at a time, or if lateral leans each direction 1 min.  Pt verbalized understanding.  Pt left in w/c with all needs in reach.  Discussed ELOS, outcomes and equipment needs with pt as well.    PT Evaluation Precautions/Restrictions Precautions Precautions: Fall Precaution Comments: LT AKA wrap, pt is anxious with mobility Restrictions Weight Bearing Restrictions: No General Family/Caregiver Present: No  Vital SignsTherapy Vitals Pulse Rate: 96 BP: 133/59 mmHg Oxygen Therapy SpO2: 91 % O2 Device: None (Room air) O2 Flow Rate (L/min): 3.5 L/min Pain Pain Assessment Pain Assessment: 0-10 Pain Score: 5  Pain Type: Acute pain Pain Location: Buttocks Pain Orientation: Mid Pain Descriptors / Indicators: Aching Pain Frequency: Constant Pain Onset: On-going Patients Stated Pain Goal: 3 Pain Intervention(s): RN made aware (provided pt with w/c cushion for pressure relieft) Home Living/Prior Functioning Home Living Available Help at Discharge: Family Type of Home: House Home Access: Stairs to enter [employer] of Steps: 16 steps in the front; 1 in the back (usually enter in the back) Entrance Stairs-Rails: Right;Left;Can reach both Home Layout: Two level;Bed/bath upstairs Alternate Level Stairs-Number of Steps: flight, patient has stair glide to access upper level Alternate Level Stairs-Rails: Right;Left  Lives With: Spouse Prior Function Level of Independence: Needs assistance with gait;Needs assistance with tranfers;Requires assistive device for independence;Needs assistance with ADLs Bath: Minimal Dressing: Moderate  Able to Take Stairs?: No Driving: Yes Vocation: Retired Leisure: Hobbies-no Comments: since previous CIR, wife was assisting with bathing and dressing, per last CIR notes, was ambulating with RW at S level, likes to watch movies at home.  Vision/Perception     Cognition Overall Cognitive Status: Within Functional Limits for tasks assessed Arousal/Alertness: Awake/alert Orientation Level: Oriented X4 Attention: Selective Selective Attention: Appears intact Memory: Appears intact Awareness: Appears intact Problem Solving: Appears intact Behaviors: Lability Safety/Judgment: Appears intact Comments: requires vc for safety (w/c brakes, positioning prior to sit<>stand. Pt able to verablized that he should not  get up without assistance from staff.   Sensation Sensation Light Touch: Appears Intact Stereognosis: Appears Intact Hot/Cold: Appears Intact Proprioception: Appears Intact Coordination Gross Motor Movements are Fluid and Coordinated: No Fine Motor Movements are Fluid and Coordinated: No Coordination and Movement Description: Pt with history of left hemiparesis in both the UE and LE secondary to older CVA.  Decreased smoothness of LE movements and UE reaching.  Tends to move with compensation and abnormal movement patterns for hip flexion and functional reaching.   Motor  Motor Motor: Hemiplegia Motor - Skilled Clinical Observations: Pt still with residual hemiparesis at LUE, premorbid.  Now with new L AKA with increased weakness.   Motor - Discharge Observations: Pt still with residual hemiparesis at LUE, premorbid.   Mobility Bed Mobility Bed Mobility: Supine to Sit;Rolling Right;Rolling Left;Sit to Supine Rolling Right: 4: Min assist;With rail Rolling Right Details: Verbal cues for technique;Verbal cues for sequencing;Verbal cues for precautions/safety Rolling Left: 4: Min assist Rolling Left Details: Verbal cues for sequencing;Verbal cues for technique;Verbal cues for precautions/safety Supine to Sit: 4: Min assist Supine to Sit Details: Verbal cues for sequencing;Verbal cues for technique;Verbal cues for precautions/safety Transfers Transfers: Yes Sit to Stand: 3: Mod assist Sit to Stand Details: Manual facilitation for weight shifting;Verbal cues for sequencing;Verbal cues for technique;Verbal cues for precautions/safety Stand to Sit: 3: Mod assist Stand to Sit Details (indicate cue type and reason): Verbal cues for safe use of DME/AE;Verbal cues for sequencing;Verbal cues for technique;Manual facilitation for weight shifting;Verbal cues for precautions/safety Stand Pivot Transfers: 3: Mod assist Stand Pivot Transfer Details: Verbal cues for sequencing;Verbal cues for technique;Verbal cues for  precautions/safety;Manual facilitation for weight shifting Locomotion  Ambulation Ambulation: No Ambulation/Gait Assistance:  (limited time during eval.) Gait Gait: No Stairs / Additional Locomotion Stairs: No Wheelchair Mobility Wheelchair Mobility: Yes Wheelchair Assistance: 4: Advertising account executive Details: Verbal cues for sequencing;Verbal cues for technique;Verbal cues for precautions/safety;Tactile cues for initiation Wheelchair Propulsion: Right lower extremity;Right upper extremity Wheelchair Parts Management: Needs assistance Distance: 15  Trunk/Postural Assessment  Cervical Assessment Cervical Assessment: Exceptions to [facility] Cervical Strength Overall Cervical Strength Comments: Pt sits with cervical protraction. Thoracic Assessment Thoracic Assessment: Exceptions to [facility] Thoracic Strength Overall Thoracic Strength Comments: Pt with kyphosis and rounding of the shoulders.  Lumbar Assessment Lumbar Assessment: Exceptions to [facility] Lumbar Strength Overall Lumbar Strength Comments: Sits with posterior pelvic tilt Postural Control Postural Control:  (Pt sits in a posterior pelvic tilt.)  Balance Balance Balance Assessed: Yes Static Sitting Balance Static Sitting - Balance Support: Feet supported;Bilateral upper extremity supported Static Sitting - Level of Assistance: 5: Stand by assistance Dynamic Sitting Balance Dynamic Sitting - Balance Support: Left upper extremity supported;Feet supported Dynamic Sitting - Level of Assistance: 4: Min assist Sitting balance - Comments: Pt with increased anxiety with forward reaching Static Standing Balance Static Standing - Balance Support: During functional activity Static Standing - Level of Assistance: 3: Mod assist;2: Max assist (max with prolonged time) Dynamic Standing Balance Dynamic Standing - Level of Assistance: Not tested (comment) Extremity Assessment  RUE Assessment RUE Assessment: Within Functional  Limits LUE Assessment LUE Assessment: Exceptions to WFL LUE AROM (degrees) Overall AROM Left Upper Extremity: Due to premorbid status LUE PROM (degrees) Overall PROM Left Upper Extremity: Due to premorbid status LUE Strength LUE Overall Strength: Due to premorbid status LUE Tone LUE Tone: Modified Ashworth Modified Ashworth Scale for Grading Hypertonia LUE: More marked increase in muscle tone through most of the ROM, but affected part(s) easily  moved RLE Assessment RLE Assessment: Within Functional Limits (grossly 3+/5) LLE Assessment LLE Assessment: Exceptions to [facility] LLE Strength LLE Overall Strength Comments: pt now with new AKA, however with weakn movement in hips from prior CVA, also with increased pain   FIM:  FIM - Bed/Chair Transfer Bed/Chair Transfer Assistive Devices: Bed rails;Arm rests Bed/Chair Transfer: 4: Supine > Sit: Min A (steadying Pt. > 75%/lift 1 leg);3: Bed > Chair or W/C: Mod A (lift or lower assist) FIM - Locomotion: Wheelchair Distance: 15 Locomotion: Wheelchair: 1: Travels less than 50 ft with minimal assistance (Pt.>75%) FIM - Locomotion: Ambulation Ambulation/Gait Assistance:  (limited time during eval.) Locomotion: Ambulation: 0: Activity did not occur FIM - Locomotion: Stairs Locomotion: Stairs: 0: Activity did not occur   Refer to Care Plan for Long Term Goals  Recommendations for other services: Neuropsych  Discharge Criteria: Patient will be discharged from PT if patient refuses treatment 3 consecutive times without medical reason, if treatment goals not met, if there is a change in medical status, if patient makes no progress towards goals or if patient is discharged from hospital.  The above assessment, treatment plan, treatment alternatives and goals were discussed and mutually agreed upon: by patient  Denice Bors 10/14/2013, 12:02 PM

## 2013-10-14 NOTE — Care Management Note (Signed)
Inpatient [facility] Individual Statement of Services  Patient Name:  Donald [name]  Date:  10/14/2013  Welcome to the [facility].  Our goal is to provide you with an individualized program based on your diagnosis and situation, designed to meet your specific needs.  With this comprehensive rehabilitation program, you will be expected to participate in at least 3 hours of rehabilitation therapies Monday-Friday, with modified therapy programming on the weekends.  Your rehabilitation program will include the following services:  Physical Therapy (PT), Occupational Therapy (OT), 24 hour per day rehabilitation nursing, Case Management (Social Worker), Rehabilitation Medicine, Nutrition Services and Pharmacy Services  Weekly team conferences will be held on Wednesday to discuss your progress.  Your Social Worker will talk with you frequently to get your input and to update you on team discussions.  Team conferences with you and your family in attendance may also be held.  Expected length of stay: 2 weeks Overall anticipated outcome: supervision/min level  Depending on your progress and recovery, your program may change. Your Social Worker will coordinate services and will keep you informed of any changes. Your Social Worker's name and contact numbers are listed  below.  The following services may also be recommended but are not provided by the [facility]:   [place] will be made to provide these services after discharge if needed.  Arrangements include referral to agencies that provide these services.  Your insurance has been verified to be:  Medicare & [place] Your primary doctor is:  Dr Berneta Sages [facility]  Pertinent information will be shared with your doctor and your insurance company.  Social Worker:  Ovidio Kin, [place] or ([phone]  Information  discussed with and copy given to patient by: Elease Hashimoto, 10/14/2013, 3:52 PM

## 2013-10-14 NOTE — Progress Notes (Addendum)
INITIAL NUTRITION ASSESSMENT  DOCUMENTATION CODES Per approved criteria  -Severe malnutrition in the context of chronic illness   INTERVENTION: Resource Breeze po BID, each supplement provides 250 kcal and 9 grams of protein RD to follow for nutrition care plan  NUTRITION DIAGNOSIS: Inadequate oral intake related to poor appetite as evidenced by pt report  Goal: Pt to meet >/= 90% of their estimated nutrition needs   Monitor:  PO & supplemental intake, weight, labs, I/O's  Reason for Assessment: Malnutrition Screening Tool Report  68 y.o. 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 right-handed male with history of CVA with left-sided weakness, HTN, PVD with multiple revascularization procedures as well his recent thrombectomy of occluded FPBG with 4 compartment fasciotomy requiring VAC 5/ 2015. He was d/c from CIR on 09/24/13 and was ambulating at supervision level with RW. He started developing increasing LLE pain with onset of numbness 10/07/13 due to recurrent thrombosis left femoral popliteal bypass.   Patient has refused chronic coumadin therapy as well as removal of infected L-axillofemoral BG. High L-AKA recommended due to level of demarcation. On 10/08/13, patient underwent L-AKA by Dr. Trula Slade and post op with severe depressive symptoms with lability as well as poor pain control. Was treated with IV iron infusion for anemia. Pschiatry consulted for input and patient started on Cymbalta for severe acute depressive symptoms. PT/OT evaluations done and CIR recommended by Rehab team and MD.   Patient seen per Student-Dietitian during acute stay 6/10.   Patient reports his appetite is rather poor; has been poor for ~ 1 month; also reports he's lost weight, however, unable to quantify; per wt readings, he's had a 10% weight loss since April 2015 (severe for time frame); also c/o constipation; does not care for Ensure as it upsets his stomach; is amenable to  trying [employer] supplement -- RD to order.  CWOCN note reviewed 6/16.  No muscle or subcutaneous fat depletion noticed.  Patient continues to meet criteria for severe malnutrition in the context of chronic illness as evidenced by < 75% intake of estimated energy requirement for > 1 month and severe weight loss of 10% in < 3 months.  Height: Ht Readings from Last 1 Encounters:  10/07/13 5\' 9"  (1.753 m)    Weight: Wt Readings from Last 1 Encounters:  10/13/13 165 lb 10.4 oz (75.137 kg)    Ideal Body Weight: 144 lb -- adjusted for AKA  % Ideal Body Weight: 114%  Wt Readings from Last 10 Encounters:  10/13/13 165 lb 10.4 oz (75.137 kg)  10/07/13 166 lb (75.297 kg)  10/07/13 166 lb (75.297 kg)  10/01/13 173 lb (78.472 kg)  09/23/13 174 lb 1.6 oz (78.971 kg)  09/11/13 175 lb (79.379 kg)  09/11/13 175 lb (79.379 kg)  09/11/13 175 lb (79.379 kg)  09/04/13 182 lb (82.555 kg)  08/04/13 183 lb (83.008 kg)    Usual Body Weight: 183 lb -- April 2015   % Usual Body Weight: 90%  BMI:  26.9 kg/2 -- adjusted for AKA  Estimated Nutritional Needs: Kcal: 1800-2000 Protein: 90-100 gm Fluid: 1.8-2.0 L  Skin: moisture associated skin damage (MASD) with suspected fungal overgrowth to perineum/gluteal cleft  Diet Order: Carb Control  EDUCATION NEEDS: -No education needs identified at this time   Intake/Output Summary (Last 24 hours) at 10/14/13 1239 Last data filed at 10/14/13 0900  Gross per 24 hour  Intake    120 ml  Output  0 ml  Net    120 ml    Labs:   Recent Labs Lab 10/11/13 0554 10/12/13 0456 10/14/13 0605  NA 137 136* 138  K 4.3 4.6 4.9  CL 97 96 100  CO2 23 25 27   BUN 14 13 20   CREATININE 0.92 0.86 1.04  CALCIUM 9.2 8.9 8.5  GLUCOSE 136* 166* 139*    CBG (last 3)   Recent Labs  10/13/13 2117 10/14/13 0735 10/14/13 1121  GLUCAP 130* 134* 136*    Scheduled Meds: . dipyridamole-aspirin  1 capsule Oral BID  . docusate sodium  100 mg  Oral BID  . DULoxetine  30 mg Oral Daily  . enoxaparin (LOVENOX) injection  40 mg Subcutaneous Q24H  . feeding supplement (GLUCERNA SHAKE)  237 mL Oral Daily  . ferrous sulfate  325 mg Oral BID WC  . fluconazole  100 mg Oral Daily  . insulin aspart  0-5 Units Subcutaneous QHS  . insulin aspart  0-9 Units Subcutaneous TID WC  . insulin glargine  5 Units Subcutaneous QHS  . lisinopril  2.5 mg Oral Daily  . multivitamin with minerals  1 tablet Oral Daily  . nicotine  7 mg Transdermal Daily  . OxyCODONE  40 mg Oral Q12H  . senna-docusate  2 tablet Oral BID  . traZODone  25 mg Oral QHS    Continuous Infusions:   Past Medical History  Diagnosis Date  . GERD (gastroesophageal reflux disease)   . Hyperlipidemia   . Hypertension   . Peripheral vascular disease   . Internal and external bleeding hemorrhoids   . History of gout     left great toe  . Depression   . Numbness in right leg     Hx: of diabetes  . Coronary artery disease     RCA occlusion with good collaterals, o/w no obs CAD 12/2011  . Pneumonia ~ 1977  . Type II diabetes mellitus   . History of blood transfusion 12/2011; 12/2012    "before OR; after OR" (01/22/2013)  . Stroke 2008    "left side weak; unable to move left hand still" (01/22/2013)  . Rectal cancer dx'd 08/2007    S/P chemo, radiation, biopsies  . Metastases to the liver dx'd 08/2010    Past Surgical History  Procedure Laterality Date  . Carotid endarterectomy Right 2008  . Liver cryoablation  08/2010    "chemo shrunk the cancer 50% then they went in and burned the rest of it"  . Axillary-femoral bypass graft  01/29/2012    Procedure: BYPASS GRAFT AXILLA-BIFEMORAL;  Surgeon: Elam Dutch, MD;  Location: [facility] OR;  Service: Vascular;  Laterality: N/A;  Left axilla-bifemoral bypass using gore-tex graft.   . Colonoscopy w/ biopsies and polypectomy      Hx: of  . Femoral-popliteal bypass graft Left 01/06/2013    Procedure: Femoral-Popliteal Artery Bypass;   Surgeon: Elam Dutch, MD;  Location: [place];  Service: Vascular;  Laterality: Left;  . Intraoperative arteriogram Left 01/06/2013    Procedure: INTRA OPERATIVE ARTERIOGRAM;  Surgeon: Elam Dutch, MD;  Location: [place];  Service: Vascular;  Laterality: Left;  . Femoral-popliteal bypass graft Left 01/10/2013    Procedure: THROMBECTOMY POSSIBLE BYPASS GRAFT FEMORAL-POPLITEAL ARTERY;  Surgeon: Mal Misty, MD;  Location: [place];  Service: Vascular;  Laterality: Left;  Attempted Thrombectomy of left Femoral/Popliteal graft - Unsuccessful.  Insertion of Left femoral to below the knee popliteal propaten graft.  . Mass biopsy  2009    "found mass that was cancer; later had chemo and radiation; never cut mass out" (01/22/2013)  . Femoral-popliteal bypass graft Left 09/05/2013    Procedure: Thrombectomy of Left Femoral-Below Knee Popliteal Bypass Graft; Patch Angioplasty of Distal Portion of Bypass Graft;  Surgeon: Angelia Mould, MD;  Location: [place];  Service: Vascular;  Laterality: Left;  . Patch angioplasty Left 09/05/2013    Procedure: Patch Angioplasty Distal Portion of Left Femoral-Below Knee Popliteal Bypass Graft;  Surgeon: Angelia Mould, MD;  Location: [place];  Service: Vascular;  Laterality: Left;  . Intraoperative arteriogram Left 09/05/2013    Procedure: INTRA OPERATIVE ARTERIOGRAM;  Surgeon: Angelia Mould, MD;  Location: [place];  Service: Vascular;  Laterality: Left;  . Fasciotomy Left 09/05/2013    Procedure: FASCIOTOMY;  Surgeon: Angelia Mould, MD;  Location: [place];  Service: Vascular;  Laterality: Left;  Marland Kitchen Vascular surgery    . Fasciotomy closure Left 09/11/2013    Procedure: CLOSURE OF FASCIOTOMY SITES LEFT LOWER EXTREMITY AND WOUND VAC APPLICATION;  Surgeon: Angelia Mould, MD;  Location: [place];  Service: Vascular;  Laterality: Left;  . Amputation Left 10/08/2013    Procedure: LEFT ABOVE KNEE AMPUTATION;  Surgeon: Serafina Mitchell, MD;  Location: [place];   Service: Vascular;  Laterality: Left;    Arthur Holms, RD, LDN Pager #: [phone] After-Hours Pager #: [phone]

## 2013-10-14 NOTE — Progress Notes (Signed)
Social Work Patient ID: Donald [name], male   DOB: [birth year], 68 y.o.   MRN: [id number] Met with pt and wife to give them update regarding team conference goals-supervision/min level and discharge 6/30. They are aware this is tentative and will conference again next Wed.  Pt doesn't feel he did too well today, discussed it is his first day and to give Himself a break and move forward from here.  Wife was very encouraging and informed pt to be easy for today.  Will continue to provide support and Work on discharge plans.

## 2013-10-14 NOTE — Progress Notes (Signed)
Subjective/Complaints: 68 y.o. right-handed male with history of CVA with left-sided weakness, HTN, PVD with multiple revascularization procedures as well his recent thrombectomy of occluded FPBG with 4 compartment fasciotomy requiring VAC 5/ 2015. He was d/c from CIR on 09/24/13 and was ambulating at supervision level with RW. He started developing increasing LLE pain with onset of numbness 10/07/13 due to recurrent thrombosis left femoral popliteal bypass. Patient has refused chronic coumadin therapy as well as removal of infected L-axillofemoral BG. High L-AKA recommended due to level of demarcation. On 10/08/13, patient underwent L-AKA by Dr. Trula Slade and post op with severe depressive symptoms with lability as well as poor pain control. He was started on Oxycontin for more pain management as well as Toradol. Was treated with IV iron infusion for anemia. Pschiatry consulted for input and patient started on Cymbalta for severe acute depressive symptoms   Objective: Vital Signs: Blood pressure 133/59, pulse 91, temperature 98 F (36.7 C), temperature source Oral, resp. rate 18, weight 75.137 kg (165 lb 10.4 oz), SpO2 95.00%. No results found. Results for orders placed during the hospital encounter of 10/13/13 (from the past 72 hour(s))  GLUCOSE, CAPILLARY     Status: Abnormal   Collection Time    10/13/13  5:31 PM      Result Value Ref Range   Glucose-Capillary 134 (*) 70 - 99 mg/dL   Comment 1 Notify RN    GLUCOSE, CAPILLARY     Status: Abnormal   Collection Time    10/13/13  9:17 PM      Result Value Ref Range   Glucose-Capillary 130 (*) 70 - 99 mg/dL  CBC WITH DIFFERENTIAL     Status: Abnormal   Collection Time    10/14/13  6:05 AM      Result Value Ref Range   WBC 6.7  4.0 - 10.5 K/uL   RBC 3.15 (*) 4.22 - 5.81 MIL/uL   Hemoglobin 8.2 (*) 13.0 - 17.0 g/dL   Comment: REPEATED TO VERIFY   HCT 26.5 (*) 39.0 - 52.0 %   MCV 84.1  78.0 - 100.0 fL   MCH 26.0  26.0 - 34.0 pg   MCHC  30.9  30.0 - 36.0 g/dL   RDW 23.2 (*) 11.5 - 15.5 %   Comment: REPEATED TO VERIFY   Platelets 298  150 - 400 K/uL   Neutrophils Relative % 81 (*) 43 - 77 %   Lymphocytes Relative 11 (*) 12 - 46 %   Monocytes Relative 8  3 - 12 %   Eosinophils Relative 0  0 - 5 %   Basophils Relative 0  0 - 1 %   Neutro Abs 5.5  1.7 - 7.7 K/uL   Lymphs Abs 0.7  0.7 - 4.0 K/uL   Monocytes Absolute 0.5  0.1 - 1.0 K/uL   Eosinophils Absolute 0.0  0.0 - 0.7 K/uL   Basophils Absolute 0.0  0.0 - 0.1 K/uL   RBC Morphology ELLIPTOCYTES     Comment: BURR CELLS   WBC Morphology INCREASED BANDS (>20% BANDS)     Comment: MODERATE LEFT SHIFT (>5% METAS AND MYELOS,OCC PRO NOTED)  COMPREHENSIVE METABOLIC PANEL     Status: Abnormal   Collection Time    10/14/13  6:05 AM      Result Value Ref Range   Sodium 138  137 - 147 mEq/L   Potassium 4.9  3.7 - 5.3 mEq/L   Chloride 100  96 - 112 mEq/L   CO2  27  19 - 32 mEq/L   Glucose, Bld 139 (*) 70 - 99 mg/dL   BUN 20  6 - 23 mg/dL   Creatinine, Ser 1.04  0.50 - 1.35 mg/dL   Calcium 8.5  8.4 - 10.5 mg/dL   Total Protein 5.9 (*) 6.0 - 8.3 g/dL   Albumin 2.1 (*) 3.5 - 5.2 g/dL   AST 35  0 - 37 U/L   ALT 13  0 - 53 U/L   Alkaline Phosphatase 83  39 - 117 U/L   Total Bilirubin 0.4  0.3 - 1.2 mg/dL   GFR calc non Af Amer 71 (*) >90 mL/min   GFR calc Af Amer 83 (*) >90 mL/min   Comment: (NOTE)     The eGFR has been calculated using the CKD EPI equation.     This calculation has not been validated in all clinical situations.     eGFR's persistently <90 mL/min signify possible Chronic Kidney     Disease.  GLUCOSE, CAPILLARY     Status: Abnormal   Collection Time    10/14/13  7:35 AM      Result Value Ref Range   Glucose-Capillary 134 (*) 70 - 99 mg/dL   Comment 1 Notify RN       HEENT: poor oral hygiene, left facial weakness Cardio: RRR and no  murmur Resp: CTA B/L and unlabored GI: BS positive and NT, ND Extremity:  Pulses positive and No Edema Skin:   Intact  and Rash erythematous macular with satellite lesions, no skin tear,  over entire perineal relative sparing of scrotum Neuro: Alert/Oriented, Abnormal Motor 2-/5 R delt bi tri grip, 2- L HF, RIght side normal strength, Tone:  increased Flexor tone LUE and Dysarthric Musc/Skel:  Other left AKA Gen NAD   Assessment/Plan: 1. Functional deficits secondary to Left AKA /PAD which require 3+ hours per day of interdisciplinary therapy in a comprehensive inpatient rehab setting. Physiatrist is providing close team supervision and 24 hour management of active medical problems listed below. Physiatrist and rehab team continue to assess barriers to discharge/monitor patient progress toward functional and medical goals. FIM:                   Comprehension Comprehension Mode: Auditory Comprehension: 6-Follows complex conversation/direction: With extra time/assistive device  Expression Expression Mode: Verbal Expression: 5-Expresses basic needs/ideas: With extra time/assistive device  Social Interaction Social Interaction: 6-Interacts appropriately with others with medication or extra time (anti-anxiety, antidepressant).  Problem Solving Problem Solving: 5-Solves complex 90% of the time/cues < 10% of the time  Memory Memory: 6-More than reasonable amt of time  Medical Problem List and Plan:  1. 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 DVT Prophylaxis/Anticoagulation: Pharmaceutical: Lovenox  3. Pain Management: On oxycontin 40 mg bid. Oxycodone 10 mg prn breakthrough pain.  4. Depressed Mood: continue cymbalta. Team to provide ego support. LCSW to follow for evaluation and support.  5. Neuropsych: This patient is capable of making decisions on his own behalf.  6. HTN: Monitor every 8 hours. Lisinopril resumed today.  7. H/o rectal cancer: Has problems with rectal pain when constipated. No results with mag citrated. Will treat with SSE.  8. Chronic constipation: Will  increase Senna S to 2 pills bid. Suppository every 24 hours if no BM.  9. DM type 2: Will monitor BS with ac/hs checks. Continue Lantus 5 units and will titrate to home dose as po's improve.  10. Iron deficiency anemia:  Loaded with IV iron. Continue po supplement.  37. H/o CVA with left spastic hemiparesis: Continue aggrenox.  12. Skin: large fungal rash with breakdown in saddle area. Add diflucan, continue local care per [place]. Air mattress   LOS (Days) 1 A FACE TO FACE EVALUATION WAS PERFORMED  KIRSTEINS,ANDREW E 10/14/2013, 9:16 AM

## 2013-10-14 NOTE — Progress Notes (Addendum)
Physical Therapy Session Note  Patient Details  Name: Donald [name] MRN: [id number] Date of Birth: [birth year]  Today's Date: 10/14/2013 Time: 1884-1660 Time Calculation (min): 40 min  Short Term Goals: Week 1:  PT Short Term Goal 1 (Week 1): Pt will perform bed mobility with S with HOB flat and without bed rails PT Short Term Goal 2 (Week 1): Pt will perform dynamic standing x 3 mins at min A level with RUE supported PT Short Term Goal 3 (Week 1): Pt will self propel w/c x 150' using R hemi technique at S level PT Short Term Goal 4 (Week 1): Pt will verbalize pressure relief techniques with min cues  Skilled Therapeutic Interventions/Progress Updates:  Pt reporting 10/10 left leg surgical and phantom limb pain.  RN made aware.  Pt still willing to participate in PT session despite pain.  On 3.5 L O2 [place] at beginning of session and per pt report was not using O2 PTA at home, so PT tried pt with O2 off.  Most of session he ranged from 90-91% on RA, however, there were a few times observed where he did drop as low as 84% on RA during mobility and talking.  He could increase with pursed lip breathing, no talking to 90-91% again with seated rest break.  O2 brought along during session as a precaution.  Session focused on pain management education (self massage), WC mobility 100' supervision mostly, but with some door thresholds and tighter areas in the hallway min assist.  Education re: WC parts management throughout session.  Transfers with slide board min assist for transfer max assist for board placement.  We did level surface transfers and to the right only due to left leg was so sensitive to move and/or touch. On mat table worked on sitting balance and reaching all directions with his right hand outside of BOS.  Initially mod assist due to posterior LOB, but got up to min assist to reach foot and otherwise supervision for forward plane motions.  Sit to stand transfers in parallel bars mod assist  to support trunk and block right knee during transitions.  Pt unable to fully extend right elbow for fully upright standing posture, but did fully extend right knee (just leaning on his right forearm for support).  Transfer WC to bed with slide board also min assist for transfer pt using bed rail and WC parts for leverage.  Level transfer to the right side.  Pt left seated EOB with bed alarm on and RN tech aware that he wanted to sit EOB.    See FIM for current functional status  Therapy/Group: Individual Therapy  Wells Guiles B. [place], [place], DPT [phone]   10/14/2013, 1:59 PM

## 2013-10-14 NOTE — Evaluation (Signed)
Occupational Therapy Assessment and Plan  Patient Details  Name: Donald [name] MRN: [id number] Date of Birth: 09/08/1945  OT Diagnosis: acute pain, hemiplegia affecting non-dominant side (premorbid), muscle weakness (generalized)  Rehab Potential: Rehab Potential: Good ELOS: 10-14 days   Today's Date: 10/14/2013 Time: 0930-1030 Time Calculation (min): 60 min  Problem List:  Patient Active Problem List   Diagnosis Date Noted  . S/P AKA (above knee amputation) 10/13/2013  . Uncontrolled pain 10/10/2013  . Type II or unspecified type diabetes mellitus with peripheral circulatory disorders, uncontrolled(250.72) 10/10/2013  . Protein-calorie malnutrition, severe 10/09/2013  . Diabetes mellitus with circulatory complication 24/12/7351  . HTN (hypertension) 10/07/2013  . Anemia 10/07/2013  . Ischemia of extremity 10/07/2013  . Ischemic leg 09/05/2013  . Wound discharge 04/09/2013  . Hemiparesis affecting left side as late effect of stroke 01/05/2013  . Abnormality of gait 09/03/2012  . Other acquired deformity of ankle and foot(736.79) 09/03/2012  . Aftercare following surgery of the circulatory system, [place] 05/15/2012  . 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 Peripheral vascular disease, unspecified 03/06/2012  . PVD (peripheral vascular disease) 02/21/2012  . Occlusion and stenosis of carotid artery without mention of cerebral infarction 02/21/2012  . Physical deconditioning 01/31/2012  . Atherosclerotic peripheral vascular disease with rest pain 01/24/2012  . Preoperative evaluation to rule out surgical contraindication 01/24/2012  . Anal carcinoma 10/30/2011  . Anal cancer 02/26/2011  . [place] MALIGNANT NEOPLASM OF LARGE INTESTINE&RECTUM 11/09/2008  . NAUSEA AND VOMITING 11/09/2008  . CONSTIPATION 08/18/2008  . RECTAL BLEEDING 08/18/2008  . ABDOMINAL PAIN -GENERALIZED 08/18/2008  . DM 08/17/2008  . HYPERLIPIDEMIA  08/17/2008  . HEMORRHOIDS 08/17/2008  . CONSTIPATION, CHRONIC 08/17/2008  . CEREBROVASCULAR ACCIDENT, HX OF 08/17/2008    Past Medical History:  Past Medical History  Diagnosis Date  . GERD (gastroesophageal reflux disease)   . Hyperlipidemia   . Hypertension   . Peripheral vascular disease   . Internal and external bleeding hemorrhoids   . History of gout     left great toe  . Depression   . Numbness in right leg     Hx: of diabetes  . Coronary artery disease     RCA occlusion with good collaterals, o/w no obs CAD 12/2011  . Pneumonia ~ 1977  . Type II diabetes mellitus   . History of blood transfusion 12/2011; 12/2012    "before OR; after OR" (01/22/2013)  . Stroke 2008    "left side weak; unable to move left hand still" (01/22/2013)  . Rectal cancer dx'd 08/2007    S/P chemo, radiation, biopsies  . Metastases to the liver dx'd 08/2010   Past Surgical History:  Past Surgical History  Procedure Laterality Date  . Carotid endarterectomy Right 2008  . Liver cryoablation  08/2010    "chemo shrunk the cancer 50% then they went in and burned the rest of it"  . Axillary-femoral bypass graft  01/29/2012    Procedure: BYPASS GRAFT AXILLA-BIFEMORAL;  Surgeon: Elam Dutch, MD;  Location: [facility] OR;  Service: Vascular;  Laterality: N/A;  Left axilla-bifemoral bypass using gore-tex graft.   . Colonoscopy w/ biopsies and polypectomy      Hx: of  . Femoral-popliteal bypass graft Left 01/06/2013    Procedure: Femoral-Popliteal Artery Bypass;  Surgeon: Elam Dutch, MD;  Location: [place];  Service: Vascular;  Laterality: Left;  . Intraoperative arteriogram Left 01/06/2013    Procedure: INTRA OPERATIVE ARTERIOGRAM;  Surgeon: Jessy Oto  Fields, MD;  Location: [place];  Service: Vascular;  Laterality: Left;  . Femoral-popliteal bypass graft Left 01/10/2013    Procedure: THROMBECTOMY POSSIBLE BYPASS GRAFT FEMORAL-POPLITEAL ARTERY;  Surgeon: Mal Misty, MD;  Location: [place];  Service: Vascular;   Laterality: Left;  Attempted Thrombectomy of left Femoral/Popliteal graft - Unsuccessful.  Insertion of Left femoral to below the knee popliteal propaten graft.  . Mass biopsy  2009    "found mass that was cancer; later had chemo and radiation; never cut mass out" (01/22/2013)  . Femoral-popliteal bypass graft Left 09/05/2013    Procedure: Thrombectomy of Left Femoral-Below Knee Popliteal Bypass Graft; Patch Angioplasty of Distal Portion of Bypass Graft;  Surgeon: Angelia Mould, MD;  Location: [place];  Service: Vascular;  Laterality: Left;  . Patch angioplasty Left 09/05/2013    Procedure: Patch Angioplasty Distal Portion of Left Femoral-Below Knee Popliteal Bypass Graft;  Surgeon: Angelia Mould, MD;  Location: [place];  Service: Vascular;  Laterality: Left;  . Intraoperative arteriogram Left 09/05/2013    Procedure: INTRA OPERATIVE ARTERIOGRAM;  Surgeon: Angelia Mould, MD;  Location: [place];  Service: Vascular;  Laterality: Left;  . Fasciotomy Left 09/05/2013    Procedure: FASCIOTOMY;  Surgeon: Angelia Mould, MD;  Location: [place];  Service: Vascular;  Laterality: Left;  [facility] Vascular surgery    . Fasciotomy closure Left 09/11/2013    Procedure: CLOSURE OF FASCIOTOMY SITES LEFT LOWER EXTREMITY AND WOUND VAC APPLICATION;  Surgeon: Angelia Mould, MD;  Location: [place];  Service: Vascular;  Laterality: Left;  . Amputation Left 10/08/2013    Procedure: LEFT ABOVE KNEE AMPUTATION;  Surgeon: Serafina Mitchell, MD;  Location: [facility] OR;  Service: Vascular;  Laterality: Left;    Assessment & Plan Clinical Impression: Patient is a 68 y.o. year old right-handed male with history of CVA with left-sided weakness, HTN, PVD with multiple revascularization procedures as well his recent thrombectomy of occluded FPBG with 4 compartment fasciotomy requiring VAC 5/ 2015. He was d/c from CIR on 09/24/13 and was ambulating at supervision level with RW. He started developing increasing LLE pain with onset  of numbness 10/07/13 due to recurrent thrombosis left femoral popliteal bypass. Patient has refused chronic coumadin therapy as well as removal of infected L-axillofemoral BG. High L-AKA recommended due to level of demarcation. On 10/08/13, patient underwent L-AKA by Dr. Trula Slade and post op with severe depressive symptoms with lability as well as poor pain control. He was started on Oxycontin for more pain management as well as Toradol. Was treated with IV iron infusion for anemia. Pschiatry consulted for input and patient started on Cymbalta for severe acute depressive symptoms.. Patient admitted for comprehensive rehab program.   Patient transferred to CIR on 10/13/2013 .    Patient currently requires max assist with basic self-care skills secondary to hemiplegia and decreased balance strategies.  Prior to hospitalization, patient could complete BADL with min assist.  Patient will benefit from skilled intervention to decrease level of assist with basic self-care skills prior to discharge home with care partner.  Anticipate patient will require minimal physical assistance and follow up home health.  OT - End of Session Activity Tolerance: Tolerates 10 - 20 min activity with multiple rests Endurance Deficit: Yes OT Assessment Rehab Potential: Good OT Patient demonstrates impairments in the following area(s): Balance;Safety;Endurance;Pain OT Basic ADL's Functional Problem(s): Bathing;Dressing;Toileting OT Transfers Functional Problem(s): Toilet;Tub/Shower OT Additional Impairment(s): Fuctional Use of Upper Extremity OT Plan OT Intensity: Minimum of 1-2  x/day, 45 to 90 minutes OT Frequency: 5 out of 7 days OT Duration/Estimated Length of Stay: 10-14 days OT Treatment/Interventions: Functional mobility training;Pain management;Patient/family education;Psychosocial support;Skin care/wound managment;Self Care/advanced ADL retraining;Therapeutic Activities;Therapeutic Exercise;Wheelchair  propulsion/positioning;DME/adaptive Actuary OT Self Feeding Anticipated Outcome(s): Modified Independent OT Basic Self-Care Anticipated Outcome(s): Supervision OT Toileting Anticipated Outcome(s): Supervision OT Bathroom Transfers Anticipated Outcome(s): Min Assist OT Recommendation Patient destination: Home Follow Up Recommendations: Home health OT Equipment Recommended: To be determined  Skilled Therapeutic Intervention  1:1 OT initial evaluation completed with treatment provided to emphasize supported standing at sink, assisted performance of ADL, pain management, and reinforcement of NMR of L-UE.   Patient performed bathing/dressing at sink side, w/c level, although able to stand partially (75%) with mod assist (lift) to initiate and steadying assist to maintain standing balance during assisted lower body dressing (max assist).   Patient endorses symptoms of anxiety/depression although w/o suicidal ideation.    OT Evaluation Precautions/Restrictions  Precautions Precautions: Fall Precaution Comments: LT AKA wrap  Restrictions Weight Bearing Restrictions: No  General Chart Reviewed: Yes Family/Caregiver Present: No  Vital Signs Therapy Vitals BP: 133/59 mmHg  Pain Pain Assessment Pain Assessment: No/denies pain Pain Score: 8  Pain Type: Surgical pain;Phantom pain;Acute pain Pain Orientation: Other (Comment) (back,buttocks,lt leg) Pain Frequency: Constant Pain Onset: On-going Patients Stated Pain Goal: 3  Home Living/Prior Functioning Home Living Family/patient expects to be discharged to:: Private residence Living Arrangements: Spouse/significant other Available Help at Discharge: Family Type of Home: House Home Access: Stairs to enter [employer] of Steps: 16 steps in the front; 1 in the back (usually enter in the back) Entrance Stairs-Rails: Right;Left;Can reach both Home Layout: Two  level;Bed/bath upstairs Alternate Level Stairs-Number of Steps: flight, patient has stair glide to access upper level Alternate Level Stairs-Rails: Right;Left  Lives With: Spouse IADL History Homemaking Responsibilities: No Current License: Yes Mode of Transportation: Car;Family Education: HS Occupation: Retired Type of Occupation: Retired from [employer], patient states he worked there for about 64 years Leisure and Hobbies: nothing since CVA Prior Function Level of Independence: Needs assistance with gait;Needs assistance with tranfers;Requires assistive device for independence;Needs assistance with ADLs Bath: Minimal Dressing: Moderate  Able to Take Stairs?: No Driving: Yes Vocation: Retired Leisure: Hobbies-no Comments: since previous CIR, wife was assisting with bathing and dressing, per last CIR notes, was ambulating with RW at S level, likes to watch movies at home.   ADL ADL ADL Comments: see FIM  Vision/Perception  Vision- History Baseline Vision/History: Wears glasses Wears Glasses: At all times Patient Visual Report: No change from baseline Vision- Assessment Vision Assessment?: No apparent visual deficits   Cognition Overall Cognitive Status: Within Functional Limits for tasks assessed Arousal/Alertness: Awake/alert Orientation Level: Oriented X4 Attention: Selective Selective Attention: Appears intact Memory: Appears intact Awareness: Appears intact Problem Solving: Appears intact Behaviors: Lability Comments: requires vc for safety (w/c brakes, positioning prior to sit<>stand   Sensation Sensation Light Touch: Appears Intact Stereognosis: Appears Intact Hot/Cold: Appears Intact Proprioception: Appears Intact Coordination Gross Motor Movements are Fluid and Coordinated: No Fine Motor Movements are Fluid and Coordinated: No Coordination and Movement Description: Pt with history of left hemiparesis in both the UE and LE secondary to older CVA.   Decreased smoothness of LE movements and UE reaching.  Tends to move with compensation and abnormal movement patterns for hip flexion and functional reaching.    Motor  Motor Motor: Hemiplegia Motor - Discharge Observations: Pt still with residual hemiparesis at LUE, premorbid.  Mobility  Bed Mobility Bed Mobility: Not assessed Transfers Transfers: Sit to Stand;Stand to Sit Sit to Stand: 3: Mod assist Sit to Stand Details: Manual facilitation for weight shifting Stand to Sit: 4: Min assist Stand to Sit Details (indicate cue type and reason): Verbal cues for safe use of DME/AE   Trunk/Postural Assessment  Cervical Assessment Cervical Assessment: Exceptions to [facility] Cervical Strength Overall Cervical Strength Comments: Pt sits with cervical protraction. Thoracic Assessment Thoracic Assessment: Exceptions to [facility] Thoracic Strength Overall Thoracic Strength Comments: Pt with kyphosis and rounding of the shoulders.  Lumbar Assessment Lumbar Assessment: Exceptions to [facility] Lumbar Strength Overall Lumbar Strength Comments: Sits with posterior pelvic tilt Postural Control Postural Control:  (Pt sits in a posterior pelvic tilt.)   Balance Balance Balance Assessed: Yes Static Sitting Balance Static Sitting - Balance Support: Feet supported;Bilateral upper extremity supported Static Sitting - Level of Assistance: 5: Stand by assistance Dynamic Sitting Balance Dynamic Sitting - Balance Support: Left upper extremity supported;Feet supported Dynamic Sitting - Level of Assistance: 4: Min assist Sitting balance - Comments: Pt with increased anxiety with forward reaching Static Standing Balance Static Standing - Balance Support: During functional activity Static Standing - Level of Assistance: 3: Mod assist;2: Max assist (max with prolonged time) Dynamic Standing Balance Dynamic Standing - Level of Assistance: Not tested (comment)  Extremity/Trunk Assessment RUE Assessment RUE  Assessment: Within Functional Limits LUE Assessment LUE Assessment: Exceptions to WFL LUE AROM (degrees) Overall AROM Left Upper Extremity: Due to premorbid status LUE PROM (degrees) Overall PROM Left Upper Extremity: Due to premorbid status LUE Strength LUE Overall Strength: Due to premorbid status LUE Tone LUE Tone: Modified Ashworth Modified Ashworth Scale for Grading Hypertonia LUE: More marked increase in muscle tone through most of the ROM, but affected part(s) easily moved  FIM:  FIM - Grooming Grooming Steps: Wash, rinse, dry face;Oral care, brush teeth, clean dentures;Brush, comb hair;Shave or apply make-up Grooming: 5: Set-up assist to obtain items FIM - Bathing Bathing Steps Patient Completed: Chest;Left Arm;Abdomen;Front perineal area;Right upper leg Bathing: 3: Mod-Patient completes 5-7 40f10 parts or 50-74% FIM - Upper Body Dressing/Undressing Upper body dressing/undressing steps patient completed: Thread/unthread right sleeve of pullover shirt/dresss;Thread/unthread left sleeve of pullover shirt/dress;Put head through opening of pull over shirt/dress Upper body dressing/undressing: 4: Min-Patient completed 75 plus % of tasks FIM - Lower Body Dressing/Undressing Lower body dressing/undressing: 1: Total-Patient completed less than 25% of tasks   Refer to Care Plan for Long Term Goals  Recommendations for other services: None  Discharge Criteria: Patient will be discharged from OT if patient refuses treatment 3 consecutive times without medical reason, if treatment goals not met, if there is a change in medical status, if patient makes no progress towards goals or if patient is discharged from hospital.  The above assessment, treatment plan, treatment alternatives and goals were discussed and mutually agreed upon: by patient  [facility]6/17/2015, 12:20 PM

## 2013-10-15 ENCOUNTER — Inpatient Hospital Stay (HOSPITAL_COMMUNITY): Payer: Self-pay | Admitting: Rehabilitation

## 2013-10-15 ENCOUNTER — Inpatient Hospital Stay (HOSPITAL_COMMUNITY): Payer: Medicare Other | Admitting: *Deleted

## 2013-10-15 ENCOUNTER — Encounter (HOSPITAL_COMMUNITY): Payer: Self-pay | Admitting: Occupational Therapy

## 2013-10-15 ENCOUNTER — Inpatient Hospital Stay (HOSPITAL_COMMUNITY): Payer: Self-pay | Admitting: Occupational Therapy

## 2013-10-15 DIAGNOSIS — L98499 Non-pressure chronic ulcer of skin of other sites with unspecified severity: Secondary | ICD-10-CM

## 2013-10-15 DIAGNOSIS — S78119A Complete traumatic amputation at level between unspecified hip and knee, initial encounter: Secondary | ICD-10-CM

## 2013-10-15 DIAGNOSIS — E119 Type 2 diabetes mellitus without complications: Secondary | ICD-10-CM

## 2013-10-15 DIAGNOSIS — G8110 Spastic hemiplegia affecting unspecified side: Secondary | ICD-10-CM

## 2013-10-15 DIAGNOSIS — I739 Peripheral vascular disease, unspecified: Secondary | ICD-10-CM

## 2013-10-15 LAB — GLUCOSE, CAPILLARY
GLUCOSE-CAPILLARY: 132 mg/dL — AB (ref 70–99)
GLUCOSE-CAPILLARY: 148 mg/dL — AB (ref 70–99)
Glucose-Capillary: 103 mg/dL — ABNORMAL HIGH (ref 70–99)
Glucose-Capillary: 155 mg/dL — ABNORMAL HIGH (ref 70–99)

## 2013-10-15 MED ORDER — BACLOFEN 5 MG HALF TABLET
5.0000 mg | ORAL_TABLET | Freq: Every day | ORAL | Status: DC
  Administered 2013-10-15 – 2013-10-25 (×11): 5 mg via ORAL
  Filled 2013-10-15 (×13): qty 1

## 2013-10-15 NOTE — Progress Notes (Signed)
Subjective/Complaints: 68 y.o. right-handed male with history of CVA with left-sided weakness, HTN, PVD with multiple revascularization procedures as well his recent thrombectomy of occluded FPBG with 4 compartment fasciotomy requiring VAC 5/ 2015. He was d/c from CIR on 09/24/13 and was ambulating at supervision level with RW. He started developing increasing LLE pain with onset of numbness 10/07/13 due to recurrent thrombosis left femoral popliteal bypass. Patient has refused chronic coumadin therapy as well as removal of infected L-axillofemoral BG. High L-AKA recommended due to level of demarcation. On 10/08/13, patient underwent L-AKA by Dr. Trula Slade and post op with severe depressive symptoms with lability as well as poor pain control. He was started on Oxycontin for more pain management as well as Toradol. Was treated with IV iron infusion for anemia. Pschiatry consulted for input and patient started on Cymbalta for severe acute depressive symptoms  No SOB Constipation improved  Review of Systems - Negative except left stump pain Objective: Vital Signs: Blood pressure 138/65, pulse 51, temperature 98.3 F (36.8 C), temperature source Oral, resp. rate 16, weight 75.137 kg (165 lb 10.4 oz), SpO2 98.00%. No results found. Results for orders placed during the hospital encounter of 10/13/13 (from the past 72 hour(s))  GLUCOSE, CAPILLARY     Status: Abnormal   Collection Time    10/13/13  5:31 PM      Result Value Ref Range   Glucose-Capillary 134 (*) 70 - 99 mg/dL   Comment 1 Notify RN    GLUCOSE, CAPILLARY     Status: Abnormal   Collection Time    10/13/13  9:17 PM      Result Value Ref Range   Glucose-Capillary 130 (*) 70 - 99 mg/dL  CBC WITH DIFFERENTIAL     Status: Abnormal   Collection Time    10/14/13  6:05 AM      Result Value Ref Range   WBC 6.7  4.0 - 10.5 K/uL   RBC 3.15 (*) 4.22 - 5.81 MIL/uL   Hemoglobin 8.2 (*) 13.0 - 17.0 g/dL   Comment: REPEATED TO VERIFY   HCT 26.5  (*) 39.0 - 52.0 %   MCV 84.1  78.0 - 100.0 fL   MCH 26.0  26.0 - 34.0 pg   MCHC 30.9  30.0 - 36.0 g/dL   RDW 23.2 (*) 11.5 - 15.5 %   Comment: REPEATED TO VERIFY   Platelets 298  150 - 400 K/uL   Neutrophils Relative % 81 (*) 43 - 77 %   Lymphocytes Relative 11 (*) 12 - 46 %   Monocytes Relative 8  3 - 12 %   Eosinophils Relative 0  0 - 5 %   Basophils Relative 0  0 - 1 %   Neutro Abs 5.5  1.7 - 7.7 K/uL   Lymphs Abs 0.7  0.7 - 4.0 K/uL   Monocytes Absolute 0.5  0.1 - 1.0 K/uL   Eosinophils Absolute 0.0  0.0 - 0.7 K/uL   Basophils Absolute 0.0  0.0 - 0.1 K/uL   RBC Morphology ELLIPTOCYTES     Comment: BURR CELLS   WBC Morphology INCREASED BANDS (>20% BANDS)     Comment: MODERATE LEFT SHIFT (>5% METAS AND MYELOS,OCC PRO NOTED)  COMPREHENSIVE METABOLIC PANEL     Status: Abnormal   Collection Time    10/14/13  6:05 AM      Result Value Ref Range   Sodium 138  137 - 147 mEq/L   Potassium 4.9  3.7 - 5.3  mEq/L   Chloride 100  96 - 112 mEq/L   CO2 27  19 - 32 mEq/L   Glucose, Bld 139 (*) 70 - 99 mg/dL   BUN 20  6 - 23 mg/dL   Creatinine, Ser 1.04  0.50 - 1.35 mg/dL   Calcium 8.5  8.4 - 10.5 mg/dL   Total Protein 5.9 (*) 6.0 - 8.3 g/dL   Albumin 2.1 (*) 3.5 - 5.2 g/dL   AST 35  0 - 37 U/L   ALT 13  0 - 53 U/L   Alkaline Phosphatase 83  39 - 117 U/L   Total Bilirubin 0.4  0.3 - 1.2 mg/dL   GFR calc non Af Amer 71 (*) >90 mL/min   GFR calc Af Amer 83 (*) >90 mL/min   Comment: (NOTE)     The eGFR has been calculated using the CKD EPI equation.     This calculation has not been validated in all clinical situations.     eGFR's persistently <90 mL/min signify possible Chronic Kidney     Disease.  GLUCOSE, CAPILLARY     Status: Abnormal   Collection Time    10/14/13  7:35 AM      Result Value Ref Range   Glucose-Capillary 134 (*) 70 - 99 mg/dL   Comment 1 Notify RN    GLUCOSE, CAPILLARY     Status: Abnormal   Collection Time    10/14/13 11:21 AM      Result Value Ref Range    Glucose-Capillary 136 (*) 70 - 99 mg/dL   Comment 1 Notify RN    GLUCOSE, CAPILLARY     Status: Abnormal   Collection Time    10/14/13  4:45 PM      Result Value Ref Range   Glucose-Capillary 116 (*) 70 - 99 mg/dL  GLUCOSE, CAPILLARY     Status: Abnormal   Collection Time    10/14/13  9:42 PM      Result Value Ref Range   Glucose-Capillary 151 (*) 70 - 99 mg/dL  GLUCOSE, CAPILLARY     Status: Abnormal   Collection Time    10/15/13  7:27 AM      Result Value Ref Range   Glucose-Capillary 132 (*) 70 - 99 mg/dL   Comment 1 Notify RN       HEENT: poor oral hygiene, left facial weakness Cardio: RRR and no  murmur Resp: CTA B/L and unlabored GI: BS positive and NT, ND Extremity:  Pulses positive and No Edema Skin:   Intact and Rash erythematous macular with satellite lesions, no skin tear,  over entire perineal relative sparing of scrotum Neuro: Alert/Oriented, Abnormal Motor 2-/5 R delt bi tri grip, 2- L HF, RIght side normal strength, Tone:  Increased Reduced LT sensation R foot Flexor tone LUE and Dysarthric Musc/Skel:  Other left AKA Gen NAD Ext R foot warm, intrinsic muscle atrophy,no lesions  Assessment/Plan: 1. Functional deficits secondary to Left AKA /PAD which require 3+ hours per day of interdisciplinary therapy in a comprehensive inpatient rehab setting. Physiatrist is providing close team supervision and 24 hour management of active medical problems listed below. Physiatrist and rehab team continue to assess barriers to discharge/monitor patient progress toward functional and medical goals. FIM: FIM - Bathing Bathing Steps Patient Completed: Chest;Left Arm;Abdomen;Front perineal area;Right upper leg Bathing: 3: Mod-Patient completes 5-7 3f 10 parts or 50-74%  FIM - Upper Body Dressing/Undressing Upper body dressing/undressing steps patient completed: Thread/unthread right sleeve of pullover shirt/dresss;Thread/unthread  left sleeve of pullover shirt/dress;Put head  through opening of pull over shirt/dress Upper body dressing/undressing: 4: Min-Patient completed 75 plus % of tasks FIM - Lower Body Dressing/Undressing Lower body dressing/undressing: 1: Total-Patient completed less than 25% of tasks        FIM - Control and instrumentation engineer Devices: Bed rails;Sliding board;Arm rests (better with slide board) Bed/Chair Transfer: 4: Chair or W/C > Bed: Min A (steadying Pt. > 75%);4: Bed > Chair or W/C: Min A (steadying Pt. > 75%)  FIM - Locomotion: Wheelchair Distance: 100 Locomotion: Wheelchair: 2: Travels 50 - 149 ft with minimal assistance (Pt.>75%) FIM - Locomotion: Ambulation Ambulation/Gait Assistance:  (limited time during eval.) Locomotion: Ambulation: 0: Activity did not occur  Comprehension Comprehension Mode: Auditory Comprehension: 6-Follows complex conversation/direction: With extra time/assistive device  Expression Expression Mode: Verbal Expression: 5-Expresses complex 90% of the time/cues < 10% of the time  Social Interaction Social Interaction: 5-Interacts appropriately 90% of the time - Needs monitoring or encouragement for participation or interaction.  Problem Solving Problem Solving: 5-Solves complex 90% of the time/cues < 10% of the time  Memory Memory: 6-More than reasonable amt of time  Medical Problem List and Plan:  1. 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 DVT Prophylaxis/Anticoagulation: Pharmaceutical: Lovenox  3. Pain Management: On oxycontin 40 mg bid. Oxycodone 10 mg prn breakthrough pain.  4. Depressed Mood: continue cymbalta. Team to provide ego support. LCSW to follow for evaluation and support.  5. Neuropsych: This patient is capable of making decisions on his own behalf.  6. HTN: Monitor every 8 hours. Lisinopril resumed today.  7. H/o rectal cancer: Has problems with rectal pain when constipated. No results with mag citrated. Will treat with SSE.  8. Chronic  constipation: Will increase Senna S to 2 pills bid. Suppository every 24 hours if no BM.  9. 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 Continue Lantus 5 units and will titrate to home dose as po's improve.  10. Iron deficiency anemia: Loaded with IV iron. Continue po supplement.  72. H/o CVA with left spastic hemiparesis: Continue aggrenox.  12. Skin: large fungal rash saddle area. Add diflucan, continue local care per [place]. Air mattress   LOS (Days) 2 A FACE TO FACE EVALUATION WAS PERFORMED  KIRSTEINS,ANDREW E 10/15/2013, 7:40 AM

## 2013-10-15 NOTE — Progress Notes (Signed)
Physical Therapy Session Note  Patient Details  Name: Donald [name] MRN: [id number] Date of Birth: 1946/04/01  Today's Date: 10/15/2013 Time: 7517-0017 Time Calculation (min): 43 min  Short Term Goals: Week 1:  PT Short Term Goal 1 (Week 1): Pt will perform bed mobility with S with HOB flat and without bed rails PT Short Term Goal 2 (Week 1): Pt will perform dynamic standing x 3 mins at min A level with RUE supported PT Short Term Goal 3 (Week 1): Pt will self propel w/c x 150' using R hemi technique at S level PT Short Term Goal 4 (Week 1): Pt will verbalize pressure relief techniques with min cues  Skilled Therapeutic Interventions/Progress Updates:   Pt received sitting in w/c in room, agreeable to therapy.  Pt self propelled x 150' x 1 and 100' x 1 using R hemi technique at S level.  Note marked improvement in w/c mobility, however does tend to demonstrate decreased attention to L side.  Pt states he had trouble with this following previous stroke.  Once in gym, skilled therapy session focused on supine therex for LLE, as well as sit<>stand with use of hemi walker.  Performed supine SLR x 10 reps (with visual target for increased ROM), glute sets x 10 reps, SL hip abd x 10 reps, and SL hip ext x 10 reps (manual cues for remaining in full SL position).  Ended with 3 reps sit <> stand with use of hemi walker from mat table.  Performed at mod A with continued cues for safe hand placement, scooting forward prior to standing and continued pursed lip breathing once in standing.  Transferred back to w/c as stated above and propelled most of way back to room.  Assisted remainder of distance and left in w/c with all needs in reach.  SaO2 remained in 90's on 3LO2.   Therapy Documentation Precautions:  Precautions Precautions: Fall Precaution Comments: LT AKA wrap, pt is anxious with mobility Restrictions Weight Bearing Restrictions: No   Pain: Pain Assessment Faces Pain Scale: Hurts little  more   Locomotion : Wheelchair Mobility Distance: 150   See FIM for current functional status  Therapy/Group: Individual Therapy  Denice Bors 10/15/2013, 10:29 AM

## 2013-10-15 NOTE — Progress Notes (Signed)
Social Work Donald Hashimoto, LCSW Social Worker Signed  Patient Care Conference Service date: 10/14/2013 4:10 PM  Inpatient RehabilitationTeam Conference and Plan of Care Update Date: 10/14/2013   Time: 10;50 AM     Patient Name: Donald [name]       Medical Record Number: [id number]   Date of Birth: 03/06/1946 Sex: Male         Room/Bed: 4W23C/4W23C-01 Payor Info: Payor: MEDICARE / Plan: MEDICARE PART A AND B / Product Type: *No Product type* /   Admitting Diagnosis: 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  S/P AKA (above knee amputation)  10/13/2013   .  Uncontrolled pain  10/10/2013   .  Type II or unspecified type diabetes mellitus with peripheral circulatory disorders, uncontrolled(250.72)  10/10/2013   .  Protein-calorie malnutrition, severe  10/09/2013   .  Diabetes mellitus with circulatory complication  69/62/9528   .  HTN (hypertension)  10/07/2013   .  Anemia  10/07/2013   .  Ischemia of extremity  10/07/2013   .  Ischemic leg  09/05/2013   .  Wound discharge  04/09/2013   .  Hemiparesis affecting left side as late effect of stroke  01/05/2013   .  Abnormality of gait  09/03/2012   .  Other acquired deformity of ankle and foot(736.79)  09/03/2012   .  Aftercare following surgery of the circulatory system, [place]  05/15/2012   .  Atherosclerosis of native arteries of the extremities with ulceration(440.23)  04/10/2012   .  Peripheral vascular disease, unspecified  03/06/2012   .  PVD (peripheral vascular disease)  02/21/2012   .  Occlusion and stenosis of carotid artery without mention of cerebral infarction  02/21/2012   .  Physical deconditioning  01/31/2012   .  Atherosclerotic peripheral vascular disease with rest pain  01/24/2012   .   Preoperative evaluation to rule out surgical contraindication  01/24/2012   .  Anal carcinoma  10/30/2011   .  Anal cancer  02/26/2011   .  [place] MALIGNANT NEOPLASM OF LARGE INTESTINE&RECTUM  11/09/2008   .  NAUSEA AND VOMITING  11/09/2008   .  CONSTIPATION  08/18/2008   .  RECTAL BLEEDING  08/18/2008   .  ABDOMINAL PAIN -GENERALIZED  08/18/2008   .  DM  08/17/2008   .  HYPERLIPIDEMIA  08/17/2008   .  HEMORRHOIDS  08/17/2008   .  CONSTIPATION, CHRONIC  08/17/2008   .  CEREBROVASCULAR ACCIDENT, HX OF  08/17/2008     Expected Discharge Date: Expected Discharge Date: 10/27/13  Team Members Present: Physician leading conference: Dr. Alysia Penna Social Worker Present: Donald Kin, LCSW Nurse Present: Donald Cousin, RN PT Present: Donald [name], PT OT Present: Donald [name], OT SLP Present: Other (comment);Donald [name], SLP Donald [name] Donald [name]) PPS Coordinator present : Donald [name], Donald Pons, RN, [facility]        Current Status/Progress  Goal  Weekly Team Focus   Medical     depressed on meds seen by Psych  maintain positive outlook  skin management   Bowel/Bladder     Continent of bowel and bladder, LBM 10/08/2013, Senna S 2 tab bid, Colace 100mg  biId  remain continent of bowel and bladder,  remain free of skin break down and infection  Assess routine bowel movements daily, treat constipation.   Swallow/Nutrition/ Hydration     wfl       ADL's     eval pending       Mobility     eval pending       Communication     WFL       Safety/Cognition/ Behavioral Observations    no unsafe behaviors       Pain     Pain managed with Oxy IR 10 mg  Pain <3  Assess pain q shift and prn, keep pt pain level at or < 3   Skin     monitoring skin-mattress over lay for bed  WOC eval  provide skin dressing and care     *See Care Plan and progress notes for long and short-term goals.    Barriers to Discharge:  skin issues, potential for open lesions      Possible Resolutions to  Barriers:    cont local and systemic care of peri rash      Discharge Planning/Teaching Needs:    Home with wife who can provide assistance and was prior to admission    Team Discussion:    Skin issues has overlay mattress. New eval in all areas.  Sleeping better, would benefit from Neuro-psych seeing him while here.   Revisions to Treatment Plan:    New eval    Continued Need for Acute Rehabilitation Level of Care: The patient requires daily medical management by a physician with specialized training in physical medicine and rehabilitation for the following conditions: Daily direction of a multidisciplinary physical rehabilitation program to ensure safe treatment while eliciting the highest outcome that is of practical value to the patient.: Yes Daily medical management of patient stability for increased activity during participation in an intensive rehabilitation regime.: Yes Daily analysis of laboratory values and/or radiology reports with any subsequent need for medication adjustment of medical intervention for : Neurological problems;Other;Post surgical problems  Donald [name] 10/15/2013, 8:42 AM          Patient ID: Donald [name], male   DOB: 07-Apr-1946, 68 y.o.   MRN: [id number]

## 2013-10-15 NOTE — Progress Notes (Signed)
Occupational Therapy Session Note  Patient Details  Name: HENDRIK DONATH MRN: [id number] Date of Birth: Jun 17, 1945  Today's Date: 10/15/2013 Time: 9242-6834 Time Calculation (min): 33 min  Skilled Therapeutic Interventions/Progress Updates:    Pt transferred from supine with HOB elevated to sitting with close supervision.  He was able to donn his shoe sitting EOB on the right foot with setup as well by bringing it up onto the bed.  Mr. Nahar transferred squat pivot to the left with mod assist to the wheelchair.  Took pt down to the therapy gym to work on sit to stand at the high/low table.  Pt with decreased forward weightshift for sit to stand requiring overall mod assist to achieve in 4-5 intervals.  Pt only able to stay standing initially for 30 seconds on first attempt with complaints of right anterior quad burning.  Subsequent attempts also resulted in pain and time diminished to only 10-15 seconds for each interval.    Therapy Documentation Precautions:  Precautions Precautions: Fall Precaution Comments: LT AKA wrap, pt is anxious with mobility, history of Left hemiparesis and increased tone. Restrictions Weight Bearing Restrictions: No  Pain: Pain Assessment Pain Assessment: Faces Faces Pain Scale: Hurts even more Pain Type: Acute pain Pain Location: Buttocks Pain Intervention(s): Emotional support;Repositioned ADL: ADL ADL Comments: see FIM  See FIM for current functional status  Therapy/Group: Individual Therapy  MCGUIRE,JAMES OTR/L 10/15/2013, 4:44 PM

## 2013-10-15 NOTE — Progress Notes (Signed)
Physical Therapy Session Note  Patient Details  Name: Donald [name] MRN: [id number] Date of Birth: [birth year]  Today's Date: 10/15/2013 Time: 5284-1324 Time Calculation (min): 51 min  Short Term Goals: Week 1:  PT Short Term Goal 1 (Week 1): Pt will perform bed mobility with S with HOB flat and without bed rails PT Short Term Goal 2 (Week 1): Pt will perform dynamic standing x 3 mins at min A level with RUE supported PT Short Term Goal 3 (Week 1): Pt will self propel w/c x 150' using R hemi technique at S level PT Short Term Goal 4 (Week 1): Pt will verbalize pressure relief techniques with min cues  Skilled Therapeutic Interventions/Progress Updates:   Pt received sitting in w/c in room, c/o marked pain in buttocks.  Assisted to therapy gym in order to get pt into supine to perform therex and to decrease pressure on buttocks.  Performed stand pivot transfer w/c<>mat at mod A with continued cues for hand placement and also for increased forward weight shift, as he tends to demonstrate extensor tone in trunk.  Performed supine RLE bridging x 10 reps, SLR BLE x 10 reps, SL hip ext and abd LLE x 10 reps with facilitation for maintaining SL.  Assisted back into w/c to attempt standing/hopping in // bars.  Performed 3 reps of standing with mod A and BUE support (note due to pt fatigue, able to get pts LUE on // bars).  Attempted hop in place on last rep, however pt unable to increase weight through UEs for lift off.  Algis Liming, PA in gym during part of session and discussed possible need for Baclofen or other mm relaxant for tone, however also discussed side effects of fatigue, therefore she states she will start him on small dose at night and determine with OT tomorrow if pt too fatigued.  OT notified.  Ended session with seated nustep x 5 mins at level 2 resistance with BUEs (LUE held in place with mit) and RLE to increase overall strength and endurance.  Assisted back to w/c and back to room.   Transferred back to bed at mod A.  Left pt in bed in semi R SL to increase pressure relief.  All needs in reach.   Therapy Documentation Precautions:  Precautions Precautions: Fall Precaution Comments: LT AKA wrap, pt is anxious with mobility, history of Left hemiparesis and increased tone. Restrictions Weight Bearing Restrictions: No General: Amount of Missed PT Time (min): 9 Minutes Missed Time Reason: Patient fatigue Vital Signs: Therapy Vitals Temp: 98 F (36.7 C) Temp src: Oral Pulse Rate: 69 Resp: 18 BP: 137/68 mmHg Patient Position (if appropriate): Lying Oxygen Therapy SpO2: 99 % O2 Device: Nasal cannula O2 Flow Rate (L/min): 3.5 L/min Pain: Pain Assessment Pain Assessment: Faces Faces Pain Scale: Hurts even more Pain Type: Acute pain Pain Location: Buttocks Pain Intervention(s): Emotional support;Repositioned     See FIM for current functional status  Therapy/Group: Individual Therapy  Denice Bors 10/15/2013, 5:22 PM

## 2013-10-15 NOTE — Progress Notes (Signed)
Occupational Therapy Session Note  Patient Details  Name: Donald [name] MRN: [id number] Date of Birth: 1945-09-22  Today's Date: 10/15/2013 Time: 0800-0902 Time Calculation (min): 62 min  Short Term Goals: Week 1:  OT Short Term Goal 1 (Week 1): Short Term Goals = Long Term Goals due to ELOS  Skilled Therapeutic Interventions/Progress Updates:    Pt performed bathing and dressing during session today.  Pt with some incontinence of bowel prior to session and wanted to transfer to the toilet.  Mod assist for stand/squat pivot to the wheelchair and to the toilet.  Pt was able to perform his own hygiene in sitting with lateral lean to the left side.  He needed mod assist for transfer back to the wheelchair from the toilet.  He performed bathing and dressing sit to stand at the sink.  Without O2 support his sats decreased to 83% but increased to 92% on 3Ls nasal cannula.  He demonstrated decreased ability to reach his RLE for undressing, bathing, and dressing this session.  Used stool for reaching shoe to adjust velcro straps.  He needed total assist +2 (pt 40%) for sit to stand while performing peri hygiene and pulling brief and pants over hips.    Therapy Documentation Precautions:  Precautions Precautions: Fall Precaution Comments: LT AKA wrap, pt is anxious with mobility Restrictions Weight Bearing Restrictions: No  Pain: Pain Assessment Pain Assessment: Faces Faces Pain Scale: Hurts even more Pain Type: Acute pain Pain Location: Buttocks Pain Intervention(s): Repositioned;Emotional support ADL: ADL ADL Comments: see FIM  See FIM for current functional status  Therapy/Group: Individual Therapy  MCGUIRE,JAMES OTR/L 10/15/2013, 12:16 PM

## 2013-10-15 NOTE — Progress Notes (Signed)
Social Work Assessment and Plan Social Work Assessment and Plan  Patient Details  Name: Donald [name] MRN: [id number] Date of Birth: [birth year]  Today's Date: 10/15/2013  Problem List:  Patient Active Problem List   Diagnosis Date Noted  . S/P AKA (above knee amputation) 10/13/2013  . Uncontrolled pain 10/10/2013  . Type II or unspecified type diabetes mellitus with peripheral circulatory disorders, uncontrolled(250.72) 10/10/2013  . Protein-calorie malnutrition, severe 10/09/2013  . Diabetes mellitus with circulatory complication 40/98/1191  . HTN (hypertension) 10/07/2013  . Anemia 10/07/2013  . Ischemia of extremity 10/07/2013  . Ischemic leg 09/05/2013  . Wound discharge 04/09/2013  . Hemiparesis affecting left side as late effect of stroke 01/05/2013  . Abnormality of gait 09/03/2012  . Other acquired deformity of ankle and foot(736.79) 09/03/2012  . Aftercare following surgery of the circulatory system, [place] 05/15/2012  . 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 Peripheral vascular disease, unspecified 03/06/2012  . PVD (peripheral vascular disease) 02/21/2012  . Occlusion and stenosis of carotid artery without mention of cerebral infarction 02/21/2012  . Physical deconditioning 01/31/2012  . Atherosclerotic peripheral vascular disease with rest pain 01/24/2012  . Preoperative evaluation to rule out surgical contraindication 01/24/2012  . Anal carcinoma 10/30/2011  . Anal cancer 02/26/2011  . [place] MALIGNANT NEOPLASM OF LARGE INTESTINE&RECTUM 11/09/2008  . NAUSEA AND VOMITING 11/09/2008  . CONSTIPATION 08/18/2008  . RECTAL BLEEDING 08/18/2008  . ABDOMINAL PAIN -GENERALIZED 08/18/2008  . DM 08/17/2008  . HYPERLIPIDEMIA 08/17/2008  . HEMORRHOIDS 08/17/2008  . CONSTIPATION, CHRONIC 08/17/2008  . CEREBROVASCULAR ACCIDENT, HX OF 08/17/2008   Past Medical History:  Past Medical History  Diagnosis Date  . GERD  (gastroesophageal reflux disease)   . Hyperlipidemia   . Hypertension   . Peripheral vascular disease   . Internal and external bleeding hemorrhoids   . History of gout     left great toe  . Depression   . Numbness in right leg     Hx: of diabetes  . Coronary artery disease     RCA occlusion with good collaterals, o/w no obs CAD 12/2011  . Pneumonia ~ 1977  . Type II diabetes mellitus   . History of blood transfusion 12/2011; 12/2012    "before OR; after OR" (01/22/2013)  . Stroke 2008    "left side weak; unable to move left hand still" (01/22/2013)  . Rectal cancer dx'd 08/2007    S/P chemo, radiation, biopsies  . Metastases to the liver dx'd 08/2010   Past Surgical History:  Past Surgical History  Procedure Laterality Date  . Carotid endarterectomy Right 2008  . Liver cryoablation  08/2010    "chemo shrunk the cancer 50% then they went in and burned the rest of it"  . Axillary-femoral bypass graft  01/29/2012    Procedure: BYPASS GRAFT AXILLA-BIFEMORAL;  Surgeon: Elam Dutch, MD;  Location: [facility] OR;  Service: Vascular;  Laterality: N/A;  Left axilla-bifemoral bypass using gore-tex graft.   . Colonoscopy w/ biopsies and polypectomy      Hx: of  . Femoral-popliteal bypass graft Left 01/06/2013    Procedure: Femoral-Popliteal Artery Bypass;  Surgeon: Elam Dutch, MD;  Location: [place];  Service: Vascular;  Laterality: Left;  . Intraoperative arteriogram Left 01/06/2013    Procedure: INTRA OPERATIVE ARTERIOGRAM;  Surgeon: Elam Dutch, MD;  Location: [place];  Service: Vascular;  Laterality: Left;  . Femoral-popliteal bypass graft Left 01/10/2013    Procedure: THROMBECTOMY POSSIBLE BYPASS  GRAFT FEMORAL-POPLITEAL ARTERY;  Surgeon: Mal Misty, MD;  Location: [place];  Service: Vascular;  Laterality: Left;  Attempted Thrombectomy of left Femoral/Popliteal graft - Unsuccessful.  Insertion of Left femoral to below the knee popliteal propaten graft.  . Mass biopsy  2009    "found mass  that was cancer; later had chemo and radiation; never cut mass out" (01/22/2013)  . Femoral-popliteal bypass graft Left 09/05/2013    Procedure: Thrombectomy of Left Femoral-Below Knee Popliteal Bypass Graft; Patch Angioplasty of Distal Portion of Bypass Graft;  Surgeon: Angelia Mould, MD;  Location: [place];  Service: Vascular;  Laterality: Left;  . Patch angioplasty Left 09/05/2013    Procedure: Patch Angioplasty Distal Portion of Left Femoral-Below Knee Popliteal Bypass Graft;  Surgeon: Angelia Mould, MD;  Location: [place];  Service: Vascular;  Laterality: Left;  . Intraoperative arteriogram Left 09/05/2013    Procedure: INTRA OPERATIVE ARTERIOGRAM;  Surgeon: Angelia Mould, MD;  Location: [place];  Service: Vascular;  Laterality: Left;  . Fasciotomy Left 09/05/2013    Procedure: FASCIOTOMY;  Surgeon: Angelia Mould, MD;  Location: [place];  Service: Vascular;  Laterality: Left;  Marland Kitchen Vascular surgery    . Fasciotomy closure Left 09/11/2013    Procedure: CLOSURE OF FASCIOTOMY SITES LEFT LOWER EXTREMITY AND WOUND VAC APPLICATION;  Surgeon: Angelia Mould, MD;  Location: [place];  Service: Vascular;  Laterality: Left;  . Amputation Left 10/08/2013    Procedure: LEFT ABOVE KNEE AMPUTATION;  Surgeon: Serafina Mitchell, MD;  Location: [facility] OR;  Service: Vascular;  Laterality: Left;   Social History:  reports that he has quit smoking. His smoking use included Cigarettes. He has a 5.7 pack-year smoking history. He has never used smokeless tobacco. He reports that he does not drink alcohol or use illicit drugs.  Family / Support Systems Marital Status: Married Patient Roles: Spouse;Parent Spouse/Significant Other: Bethena Roys [phone] Other Supports: [place] [phone] Anticipated Caregiver: Wife Ability/Limitations of Caregiver: Wife has been providing care to pt prior to admission. Caregiver Availability: 24/7 Family Dynamics: Close knit, very committed to one another.  Wife  is always here and participating in therapie sand offering encouragement to pt.  They have been here many times before this admission.  Their children are out of state,so it is just pt and wife.  Social History Preferred language: English Religion: Baptist Cultural Background: No issues Education: Data processing manager: Yes Write: Yes Employment Status: Retired Freight forwarder Issues: No issues Guardian/Conservator: None-according to MD pt is capable of making his own decisions while here   Abuse/Neglect Physical Abuse: Denies Verbal Abuse: Denies Sexual Abuse: Denies Exploitation of patient/patient's resources: Denies Self-Neglect: Denies  Emotional Status Pt's affect, behavior adn adjustment status: Pt is willing to try and do his best.  He is one of the glass is half empty and wonders the reason he is still here and if it would not have been better if he had died.  He states: " I know this is the coward way out, but I am tired."  He is being seen by pysch while here.  He has been through mulitple health sisues in the last few years and probably is tired of being in a hospital. Recent Psychosocial Issues: Multiple medical issues Pyschiatric History: History of depression and currently experiencing this now, being seen by psych and will have neuro-psych see while here and see if he would agree to peer support.  Deferred depression screen due to it has already been  addressed and currently being treated for depression. Substance Abuse History: Quit smoking years ago  Patient / Family Perceptions, Expectations & Goals Pt/Family understanding of illness & functional limitations: Pt and wife can explain his current health issue and surgery.  He is hoping he will do well here and not be as much care for his wife at discharge.  He is too hard on himself and expects too  much at times.  He feels his first day was not as good as he wanted.  Allowed him to ventilate and discussed to give himself  a break and take small steps in his recovery. Premorbid pt/family roles/activities: Husband,Father, Retiree, Health visitor,.Church member, etc Anticipated changes in roles/activities/participation: resume Pt/family expectations/goals: Pt states; ' I want to do well here and not burden my wife too much, I already feel like I do."  Wife states; " I will do whatever is needed, but would like him to be able to transfer and moving some."  [employer]: Other (Comment) (had in past) Premorbid Home Care/DME Agencies: Other (Comment) (had in past) Transportation available at discharge: Wife Resource referrals recommended: Support group (specify) (Amputee Support group)  Discharge Planning Living Arrangements: Spouse/significant other Support Systems: Spouse/significant other;Children;Friends/neighbors;Church/faith community;Other relatives Type of Residence: Private residence Insurance Resources: Education officer, museum (specify) Web designer) Financial Resources: Radio broadcast assistant Screen Referred: No Living Expenses: Lives with family Money Management: Spouse Does the patient have any problems obtaining your medications?: No Home Management: Wife Patient/Family Preliminary Plans: Return home with wife who can provide 24 hr care.  She has been providing care prior to admission and is very involved and will be here daily to participate and provide emotional support.  Will try to see pt daily to give some encouragement and support. Social Work Anticipated Follow Up Needs: HH/OP;Support Group  Clinical Impression Pleasant gentleman who has been on rehab numerous times for various health issues, so is well known to staff.  Wife is very committed to pt and will do whatever is needed to provide care to him. Psychiatrist seeing and will get neuro-psych to see for added support, along with myself and see if he is agreeable to peer support while here.  Pt needs much support and  encouragement to  Keep positive and motivated.  Will also provide support to wife and make sure she is taking care of herself while he is here.  Elease Hashimoto 10/15/2013, 9:02 AM

## 2013-10-16 ENCOUNTER — Inpatient Hospital Stay (HOSPITAL_COMMUNITY): Payer: Self-pay | Admitting: Occupational Therapy

## 2013-10-16 ENCOUNTER — Inpatient Hospital Stay (HOSPITAL_COMMUNITY): Payer: Medicare Other | Admitting: Occupational Therapy

## 2013-10-16 ENCOUNTER — Inpatient Hospital Stay (HOSPITAL_COMMUNITY): Payer: Self-pay | Admitting: Physical Therapy

## 2013-10-16 ENCOUNTER — Encounter (HOSPITAL_COMMUNITY): Payer: Self-pay | Admitting: Occupational Therapy

## 2013-10-16 DIAGNOSIS — L98499 Non-pressure chronic ulcer of skin of other sites with unspecified severity: Secondary | ICD-10-CM

## 2013-10-16 DIAGNOSIS — S78119A Complete traumatic amputation at level between unspecified hip and knee, initial encounter: Secondary | ICD-10-CM

## 2013-10-16 DIAGNOSIS — I739 Peripheral vascular disease, unspecified: Secondary | ICD-10-CM

## 2013-10-16 DIAGNOSIS — E119 Type 2 diabetes mellitus without complications: Secondary | ICD-10-CM

## 2013-10-16 DIAGNOSIS — G8110 Spastic hemiplegia affecting unspecified side: Secondary | ICD-10-CM

## 2013-10-16 LAB — URINALYSIS, ROUTINE W REFLEX MICROSCOPIC
BILIRUBIN URINE: NEGATIVE
Glucose, UA: NEGATIVE mg/dL
Hgb urine dipstick: NEGATIVE
KETONES UR: 15 mg/dL — AB
Leukocytes, UA: NEGATIVE
NITRITE: NEGATIVE
PH: 6 (ref 5.0–8.0)
Protein, ur: NEGATIVE mg/dL
Specific Gravity, Urine: 1.019 (ref 1.005–1.030)
Urobilinogen, UA: 0.2 mg/dL (ref 0.0–1.0)

## 2013-10-16 LAB — CBC
HEMATOCRIT: 25.1 % — AB (ref 39.0–52.0)
Hemoglobin: 7.6 g/dL — ABNORMAL LOW (ref 13.0–17.0)
MCH: 24.9 pg — AB (ref 26.0–34.0)
MCHC: 30.3 g/dL (ref 30.0–36.0)
MCV: 82.3 fL (ref 78.0–100.0)
Platelets: 374 10*3/uL (ref 150–400)
RBC: 3.05 MIL/uL — ABNORMAL LOW (ref 4.22–5.81)
RDW: 23.8 % — ABNORMAL HIGH (ref 11.5–15.5)
WBC: 12.2 10*3/uL — ABNORMAL HIGH (ref 4.0–10.5)

## 2013-10-16 LAB — GLUCOSE, CAPILLARY
GLUCOSE-CAPILLARY: 128 mg/dL — AB (ref 70–99)
Glucose-Capillary: 107 mg/dL — ABNORMAL HIGH (ref 70–99)
Glucose-Capillary: 131 mg/dL — ABNORMAL HIGH (ref 70–99)
Glucose-Capillary: 157 mg/dL — ABNORMAL HIGH (ref 70–99)

## 2013-10-16 LAB — PREPARE RBC (CROSSMATCH)

## 2013-10-16 MED ORDER — DIPHENHYDRAMINE HCL 25 MG PO CAPS
25.0000 mg | ORAL_CAPSULE | Freq: Once | ORAL | Status: AC
  Administered 2013-10-16: 25 mg via ORAL
  Filled 2013-10-16: qty 1

## 2013-10-16 MED ORDER — ACETAMINOPHEN 325 MG PO TABS: 650.0000 mg | ORAL_TABLET | Freq: Once | ORAL | Status: DC

## 2013-10-16 MED ORDER — FUROSEMIDE 10 MG/ML IJ SOLN
20.0000 mg | Freq: Once | INTRAMUSCULAR | Status: AC
  Administered 2013-10-16: 20 mg via INTRAVENOUS
  Filled 2013-10-16: qty 2

## 2013-10-16 MED ORDER — BOOST PLUS PO LIQD
237.0000 mL | Freq: Two times a day (BID) | ORAL | Status: DC
  Administered 2013-10-16 – 2013-10-25 (×7): 237 mL via ORAL
  Filled 2013-10-16 (×26): qty 237

## 2013-10-16 MED ORDER — MORPHINE SULFATE 15 MG PO TABS
15.0000 mg | ORAL_TABLET | ORAL | Status: DC | PRN
  Administered 2013-10-17 – 2013-10-19 (×5): 15 mg via ORAL
  Filled 2013-10-16 (×5): qty 1

## 2013-10-16 NOTE — Progress Notes (Signed)
Occupational Therapy Session Note  Patient Details  Name: Donald [name] MRN: [id number] Date of Birth: [birth year]  Today's Date: 10/16/2013 Time: 1433-1500 Time Calculation (min): 27 min  Skilled Therapeutic Interventions/Progress Updates:    Pt receiving second unit of blood transfusion during session.  Agreed to participate in therapy.  O2 sats 93% on 3ls O2 at rest.  He performed sit to stand intervals with mod assist from the EOB.  Prior to standing he was able to transfer from supine on the right side with min guard assist.  He then donned his right velcro shoe with mod assist as well.  Therapist assisted with stretching out his left hand in order to position on the RW as pt still exhibits increased flexor tone in his digits.  Pt able to stand for intervals of 20-30 seconds but then reports too much pain in his RLE.  Pt still with severe pain in his buttocks making it difficult to perform scooting to EOB without mod assist on the left side.  Pt transferred squat pivot to the bedside recliner with mod assist to finish session.    Therapy Documentation Precautions:  Precautions Precautions: Fall Precaution Comments: LT AKA wrap, pt is anxious with mobility, history of Left hemiparesis and increased tone. Restrictions Weight Bearing Restrictions: No  Vital Signs: Therapy Vitals Temp: 98.2 F (36.8 C) Temp src: Oral Pulse Rate: 88 Resp: 16 BP: 126/64 mmHg Oxygen Therapy SpO2: 96 % O2 Device: Nasal cannula O2 Flow Rate (L/min): 3 L/min Pain: Pain Assessment Pain Assessment: 0-10 Pain Score: 7  Faces Pain Scale: Hurts worst Pain Location: Buttocks Pain Orientation: Right;Left Pain Descriptors / Indicators: Aching;Burning Pain Onset: On-going Patients Stated Pain Goal: 6 Pain Intervention(s): Medication (See eMAR) ADL: See FIM for current functional status  Therapy/Group: Individual Therapy  MCGUIRE,JAMES OTR/L 10/16/2013, 4:30 PM

## 2013-10-16 NOTE — Progress Notes (Signed)
Occupational Therapy Session Note  Patient Details  Name: Donald [name] MRN: [id number] Date of Birth: 10/06/1945  Today's Date: 10/16/2013 Time: 0800-0900 Time Calculation (min): 60 min  Short Term Goals: Week 1:  OT Short Term Goal 1 (Week 1): Short Term Goals = Long Term Goals due to ELOS  Skilled Therapeutic Interventions/Progress Updates:    Pt performed bathing and dressing sitting at the edge of the bed.  He was able to perform UB bathing with min assist and max hand over hand assistance to incorporate the LUE for washing his right arm.  Increased extensor tone noted in the triceps and finger flexors.  Pt's O2 sats 89-91% on room air during session with sitting activity.  He was able to utilize the reacher for doffing the right sock and starting his underpants and pants over the right foot.  Total assist +2 (pt 50%) for standing to pull pants over hips with nursing assisting him while therapist assisted with balance.  Pt with severe skin issues on his buttocks which nursing is aware of but distracts him secondary to pain.  He also demonstrates the need for mod assist to scoot forward to the EOB secondary to the pain.  Squat pivot transfer to the left with mod assist to transfer to wheelchair during session.  Pt setup with breakfast and call button.  Oxygen reapplied but sats on room air communicated to nursing.   Therapy Documentation Precautions:  Precautions Precautions: Fall Precaution Comments: LT AKA wrap, pt is anxious with mobility, history of Left hemiparesis and increased tone. Restrictions Weight Bearing Restrictions: No  Pain: Pain Assessment Pain Assessment: Faces Pain Score: 10-Worst pain ever Faces Pain Scale: Hurts whole lot Pain Type: Acute pain Pain Location: Buttocks Pain Orientation: Mid Pain Descriptors / Indicators: Aching Pain Onset: On-going Patients Stated Pain Goal: 5 Pain Intervention(s): Repositioned;Emotional support ADL: Therapy/Group:  Individual Therapy  Clemma Johnsen OTR/L 10/16/2013, 12:21 PM

## 2013-10-16 NOTE — IPOC Note (Signed)
Overall Plan of Care [facility]) Patient Details Name: Donald [name] MRN: [id number] DOB: [birth year]  Admitting Diagnosis: L AKA OLD CVA  Hospital Problems: Active Problems:   S/P AKA (above knee amputation)     Functional Problem List: Nursing    PT Balance;Endurance;Motor;Safety;Skin Integrity  OT Balance;Safety;Endurance;Pain  SLP    TR         Basic ADL's: OT Bathing;Dressing;Toileting     Advanced  ADL's: OT       Transfers: PT Bed Mobility;Bed to Chair;Car;Furniture  OT Toilet;Tub/Shower     Locomotion: Advertising account planner;Ambulation (ambulation for short distance, may not set goal)     Additional Impairments: OT Fuctional Use of Upper Extremity  SLP        TR      Anticipated Outcomes Item Anticipated Outcome  Self Feeding Modified Independent  Swallowing      Basic self-care  Supervision  Toileting  Supervision   Bathroom Transfers Min Assist  Bowel/Bladder     Transfers  S  Locomotion  mod I w/ w/c mobility, min A for short distances  Communication     Cognition     Pain     Safety/Judgment      Therapy Plan: PT Intensity: Minimum of 1-2 x/day ,45 to 90 minutes PT Frequency: 5 out of 7 days PT Duration Estimated Length of Stay: 14-16 days OT Intensity: Minimum of 1-2 x/day, 45 to 90 minutes OT Frequency: 5 out of 7 days OT Duration/Estimated Length of Stay: 10-14 days         Team Interventions: Nursing Interventions    PT interventions Ambulation/gait training;Balance/vestibular training;Cognitive remediation/compensation;Discharge planning;DME/adaptive equipment instruction;Functional mobility training;Patient/family education;Skin care/wound management;Therapeutic Activities;Therapeutic Exercise;UE/LE Strength taining/ROM;Wheelchair propulsion/positioning  OT Interventions Functional mobility training;Pain management;Patient/family education;Psychosocial support;Skin care/wound managment;Self Care/advanced ADL  retraining;Therapeutic Activities;Therapeutic Exercise;Wheelchair propulsion/positioning;DME/adaptive equipment instruction;Balance/vestibular training;Discharge planning  SLP Interventions    TR Interventions    SW/CM Interventions Discharge Planning;Psychosocial Support;Patient/Family Education    Team Discharge Planning: Destination: PT-Home ,OT- Home , SLP-  Projected Follow-up: PT-Home health PT, OT-  Home health OT, SLP-  Projected Equipment Needs: PT-To be determined;Wheelchair (measurements);Wheelchair cushion (measurements), OT- To be determined, SLP-  Equipment Details: PT- , OT-  Patient/family involved in discharge planning: PT- Patient,  OT-Patient, SLP-   MD ELOS: 10-14days Medical Rehab Prognosis:  Good Assessment: 68 y.o. right-handed male with history of CVA with left-sided weakness, HTN, PVD with multiple revascularization procedures as well his recent thrombectomy of occluded FPBG with 4 compartment fasciotomy requiring VAC 5/ 2015. He was d/c from CIR on 09/24/13 and was ambulating at supervision level with RW. He started developing increasing LLE pain with onset of numbness 10/07/13 due to recurrent thrombosis left femoral popliteal bypass. Patient has refused chronic coumadin therapy as well as removal of infected L-axillofemoral BG. High L-AKA recommended due to level of demarcation. On 10/08/13, patient underwent L-AKA by Dr. Trula Slade and post op with severe depressive symptoms with lability as well as poor pain control. He was started on Oxycontin for more pain management as well as Toradol    Now requiring 24/7 Rehab RN,MD, as well as CIR level PT, OT and SLP.  Treatment team will focus on ADLs and mobility with goals set at Sup/Min A  See Team Conference Notes for weekly updates to the plan of care

## 2013-10-16 NOTE — Plan of Care (Signed)
Problem: RH SKIN INTEGRITY Goal: RH STG SKIN FREE OF INFECTION/BREAKDOWN Outcome: Not Progressing Bottom has open areas from moisture associated damage 1. .r buttock has  round area of slough.2. center at coccyx has moist open,tender areas.Pt is on overlay but does not stay of bottom despite education and reminders.

## 2013-10-16 NOTE — Progress Notes (Signed)
NUTRITION FOLLOW-UP/CONSULT  DOCUMENTATION CODES Per approved criteria  -Severe malnutrition in the context of chronic illness   Patient continues to meet criteria for severe malnutrition in the context of chronic illness as evidenced by < 75% intake of estimated energy requirement for > 1 month and severe weight loss of 10% in < 3 months.  INTERVENTION: Change Resource Breeze to [employer]. Add snacks between meals. Consider appetite stimulant. RD to follow for nutrition care plan  NUTRITION DIAGNOSIS: Inadequate oral intake related to poor appetite as evidenced by pt report. Ongoing.  Goal: Pt to meet >/= 90% of their estimated nutrition needs. Unmet.   Monitor:  PO & supplemental intake, weight, labs, I/O's  ASSESSMENT: 68 y.o. right-handed male with history of CVA with left-sided weakness, HTN, PVD with multiple revascularization procedures as well his recent thrombectomy of occluded FPBG with 4 compartment fasciotomy requiring VAC 5/ 2015. He was d/c from CIR on 09/24/13 and was ambulating at supervision level with RW. He started developing increasing LLE pain with onset of numbness 10/07/13 due to recurrent thrombosis left femoral popliteal bypass. On 10/08/13, patient underwent L-AKA.  Patient seen per Student-Dietitian during acute stay 6/10.   Patient continues with severe depression. RD consulted because pt is not eating well. Discussed with RN and PA-C. No family at bedside. Pt reports that he has no appetite and no food sounds appealing to him. PA-C reviewed snack preferences with patient's family and asked RD to order them. RN notes that pt is not drinking Facilities manager and that she has brought pt a chocolate Boost to drink.  CWOCN note reviewed 6/16.  CBG's: 107 - 155  Height: Ht Readings from Last 1 Encounters:  10/07/13 5\' 9"  (1.753 m)    Weight: Wt Readings from Last 1 Encounters:  10/13/13 165 lb 10.4 oz (75.137 kg)   BMI:  26.9 kg/2 -- adjusted for  AKA  Estimated Nutritional Needs: Kcal: 1800-2000 Protein: 90-100 gm Fluid: 1.8-2.0 L  Skin: moisture associated skin damage (MASD) with suspected fungal overgrowth to perineum/gluteal cleft  Diet Order: Carb Control   Intake/Output Summary (Last 24 hours) at 10/16/13 1401 Last data filed at 10/16/13 1300  Gross per 24 hour  Intake  852.5 ml  Output    700 ml  Net  152.5 ml    Last BM: 6/17  Labs:   Recent Labs Lab 10/11/13 0554 10/12/13 0456 10/14/13 0605  NA 137 136* 138  K 4.3 4.6 4.9  CL 97 96 100  CO2 23 25 27   BUN 14 13 20   CREATININE 0.92 0.86 1.04  CALCIUM 9.2 8.9 8.5  GLUCOSE 136* 166* 139*    CBG (last 3)   Recent Labs  10/15/13 2110 10/16/13 0734 10/16/13 1135  GLUCAP 155* 107* 131*    Scheduled Meds: . acetaminophen  650 mg Oral Once  . baclofen  5 mg Oral QHS  . diphenhydrAMINE  25 mg Oral Once  . dipyridamole-aspirin  1 capsule Oral BID  . docusate sodium  100 mg Oral BID  . DULoxetine  30 mg Oral Daily  . enoxaparin (LOVENOX) injection  40 mg Subcutaneous Q24H  . feeding supplement (RESOURCE BREEZE)  1 Container Oral BID WC  . ferrous sulfate  325 mg Oral BID WC  . fluconazole  100 mg Oral Daily  . furosemide  20 mg Intravenous Once  . insulin aspart  0-5 Units Subcutaneous QHS  . insulin aspart  0-9 Units Subcutaneous TID WC  . insulin glargine  5 Units Subcutaneous QHS  . lisinopril  2.5 mg Oral Daily  . multivitamin with minerals  1 tablet Oral Daily  . nicotine  7 mg Transdermal Daily  . OxyCODONE  40 mg Oral Q12H  . senna-docusate  2 tablet Oral BID  . traZODone  25 mg Oral QHS    Continuous Infusions:   Inda Coke MS, RD, LDN Inpatient Registered Dietitian Pager: [phone] After-hours pager: [phone]

## 2013-10-16 NOTE — Progress Notes (Signed)
Occupational Therapy Session Note  Patient Details  Name: Donald [name] MRN: [id number] Date of Birth: January 22, 1946  Today's Date: 10/16/2013 Time: 1100-1130 Time Calculation (min): 30 min  Short Term Goals: Week 1:  OT Short Term Goal 1 (Week 1): Short Term Goals = Long Term Goals due to ELOS  Skilled Therapeutic Interventions/Progress Updates:  Patient seated in recliner and stating that he was really tired and in a lot of pain on his bottom.  Provided assist with repositioning in recliner and squats 10X's to relieve pressure.  Patient's wife and sister in law arrived for short visit then left to speak with patient's PA.  Patient falling asleep during conversation and declined further therapy stating, "i'm wasting your time because I can't do anything right now".  Patient's RN stated that patient will receive blood transfusion and need to get back to bed therefore assisted with +2 slide board transfer recliner>bed traveling to his left.  Patient with increased pain in buttocks with all mobility.  Therapy Documentation Precautions:  Precautions Precautions: Fall Precaution Comments: LT AKA wrap, pt is anxious with mobility, history of Left hemiparesis and increased tone. Restrictions Weight Bearing Restrictions: No Pain: 8/10 bottom, medication requested, repositioned, pressure relief techniques performed, rest and transfer back to bed in side-lying. ADL: See FIM for current functional status  Therapy/Group: Individual Therapy  SHAFFER, [place] 10/16/2013, 12:25 PM

## 2013-10-16 NOTE — Progress Notes (Addendum)
Physical Therapy Session Note  Patient Details  Name: Donald [name] MRN: [id number] Date of Birth: 08-17-1945  Today's Date: 10/16/2013 Time: 0931-1010 Time Calculation (min): 39 min  Short Term Goals: Week 1:  PT Short Term Goal 1 (Week 1): Pt will perform bed mobility with S with HOB flat and without bed rails PT Short Term Goal 2 (Week 1): Pt will perform dynamic standing x 3 mins at min A level with RUE supported PT Short Term Goal 3 (Week 1): Pt will self propel w/c x 150' using R hemi technique at S level PT Short Term Goal 4 (Week 1): Pt will verbalize pressure relief techniques with min cues  Skilled Therapeutic Interventions/Progress Updates:   Pt missed first 15 minutes secondary to IV team present placing IV access for blood transfusion later today.  Pt reporting significant fatigue but willing to participate in therapy in room.  RN requesting to look at wound on buttocks for measurement. Performed rolling to L and R on flat bed with rails with min-mod A to doff and then don shorts for wound inspection with verbal and tactile cues for sequencing.  Performed supine > sit EOB with Mod A secondary to pt hesitant to weight shift to L.  Performed sit > stand and static standing at EOB x 2 reps for pressure relief off buttocks and for PA to examine wound on buttocks with mod-max A and pt unable to fully extend RLE; pt only able to stand for 30 seconds, max secondary to RLE pain/fatigue.  Pt requesting to get out of bed and rest in a recliner.  Pt performed slideboard bed > recliner to R with mod A overall with verbal and visual cues for use of head-hips relationship and sequencing.  Pt placed on pressure relief cushion in w/c and positioned with pillows.  Pt requesting to end session secondary to fatigue.   Therapy Documentation Precautions:  Precautions Precautions: Fall Precaution Comments: LT AKA wrap, pt is anxious with mobility, history of Left hemiparesis and increased  tone. Restrictions Weight Bearing Restrictions: No General: Amount of Missed PT Time (min): 21 Minutes Missed Time Reason: Patient fatigue Vital Signs: Therapy Vitals Temp: 97.4 F (36.3 C) Temp src: Oral Pulse Rate: 85 Resp: 16 BP: 131/66 mmHg Patient Position (if appropriate): Lying Oxygen Therapy SpO2: 96 % O2 Device: Nasal cannula O2 Flow Rate (L/min): 3 L/min Pain: Pain Assessment Pain Assessment: 0-10 Pain Score: 10-Worst pain ever Faces Pain Scale: Hurts whole lot Pain Type: Acute pain Pain Location: Buttocks Pain Orientation: Mid Pain Descriptors / Indicators: Aching Pain Onset: On-going Patients Stated Pain Goal: 5 Pain Intervention(s): Medication (See eMAR);Repositioned  See FIM for current functional status  Therapy/Group: Individual Therapy  Raylene Everts [facility] 10/16/2013, 12:18 PM

## 2013-10-16 NOTE — Progress Notes (Signed)
Subjective/Complaints: 68 y.o. right-handed male with history of CVA with left-sided weakness, HTN, PVD with multiple revascularization procedures as well his recent thrombectomy of occluded FPBG with 4 compartment fasciotomy requiring VAC 5/ 2015. He was d/c from CIR on 09/24/13 and was ambulating at supervision level with RW. He started developing increasing LLE pain with onset of numbness 10/07/13 due to recurrent thrombosis left femoral popliteal bypass. Patient has refused chronic coumadin therapy as well as removal of infected L-axillofemoral BG. High L-AKA recommended due to level of demarcation. On 10/08/13, patient underwent L-AKA by Dr. Trula Slade and post op with severe depressive symptoms with lability as well as poor pain control. He was started on Oxycontin for more pain management as well as Toradol. Was treated with IV iron infusion for anemia. Pschiatry consulted for input and patient started on Cymbalta for severe acute depressive symptoms  No SOB Felt warm last noc Buttocks still tender  Review of Systems - Negative except left stump pain Objective: Vital Signs: Blood pressure 144/79, pulse 91, temperature 97.5 F (36.4 C), temperature source Oral, resp. rate 18, weight 75.137 kg (165 lb 10.4 oz), SpO2 95.00%. No results found. Results for orders placed during the hospital encounter of 10/13/13 (from the past 72 hour(s))  GLUCOSE, CAPILLARY     Status: Abnormal   Collection Time    10/13/13  5:31 PM      Result Value Ref Range   Glucose-Capillary 134 (*) 70 - 99 mg/dL   Comment 1 Notify RN    GLUCOSE, CAPILLARY     Status: Abnormal   Collection Time    10/13/13  9:17 PM      Result Value Ref Range   Glucose-Capillary 130 (*) 70 - 99 mg/dL  CBC WITH DIFFERENTIAL     Status: Abnormal   Collection Time    10/14/13  6:05 AM      Result Value Ref Range   WBC 6.7  4.0 - 10.5 K/uL   RBC 3.15 (*) 4.22 - 5.81 MIL/uL   Hemoglobin 8.2 (*) 13.0 - 17.0 g/dL   Comment: REPEATED  TO VERIFY   HCT 26.5 (*) 39.0 - 52.0 %   MCV 84.1  78.0 - 100.0 fL   MCH 26.0  26.0 - 34.0 pg   MCHC 30.9  30.0 - 36.0 g/dL   RDW 23.2 (*) 11.5 - 15.5 %   Comment: REPEATED TO VERIFY   Platelets 298  150 - 400 K/uL   Neutrophils Relative % 81 (*) 43 - 77 %   Lymphocytes Relative 11 (*) 12 - 46 %   Monocytes Relative 8  3 - 12 %   Eosinophils Relative 0  0 - 5 %   Basophils Relative 0  0 - 1 %   Neutro Abs 5.5  1.7 - 7.7 K/uL   Lymphs Abs 0.7  0.7 - 4.0 K/uL   Monocytes Absolute 0.5  0.1 - 1.0 K/uL   Eosinophils Absolute 0.0  0.0 - 0.7 K/uL   Basophils Absolute 0.0  0.0 - 0.1 K/uL   RBC Morphology ELLIPTOCYTES     Comment: BURR CELLS   WBC Morphology INCREASED BANDS (>20% BANDS)     Comment: MODERATE LEFT SHIFT (>5% METAS AND MYELOS,OCC PRO NOTED)  COMPREHENSIVE METABOLIC PANEL     Status: Abnormal   Collection Time    10/14/13  6:05 AM      Result Value Ref Range   Sodium 138  137 - 147 mEq/L   Potassium  4.9  3.7 - 5.3 mEq/L   Chloride 100  96 - 112 mEq/L   CO2 27  19 - 32 mEq/L   Glucose, Bld 139 (*) 70 - 99 mg/dL   BUN 20  6 - 23 mg/dL   Creatinine, Ser 1.04  0.50 - 1.35 mg/dL   Calcium 8.5  8.4 - 10.5 mg/dL   Total Protein 5.9 (*) 6.0 - 8.3 g/dL   Albumin 2.1 (*) 3.5 - 5.2 g/dL   AST 35  0 - 37 U/L   ALT 13  0 - 53 U/L   Alkaline Phosphatase 83  39 - 117 U/L   Total Bilirubin 0.4  0.3 - 1.2 mg/dL   GFR calc non Af Amer 71 (*) >90 mL/min   GFR calc Af Amer 83 (*) >90 mL/min   Comment: (NOTE)     The eGFR has been calculated using the CKD EPI equation.     This calculation has not been validated in all clinical situations.     eGFR's persistently <90 mL/min signify possible Chronic Kidney     Disease.  GLUCOSE, CAPILLARY     Status: Abnormal   Collection Time    10/14/13  7:35 AM      Result Value Ref Range   Glucose-Capillary 134 (*) 70 - 99 mg/dL   Comment 1 Notify RN    GLUCOSE, CAPILLARY     Status: Abnormal   Collection Time    10/14/13 11:21 AM       Result Value Ref Range   Glucose-Capillary 136 (*) 70 - 99 mg/dL   Comment 1 Notify RN    GLUCOSE, CAPILLARY     Status: Abnormal   Collection Time    10/14/13  4:45 PM      Result Value Ref Range   Glucose-Capillary 116 (*) 70 - 99 mg/dL  GLUCOSE, CAPILLARY     Status: Abnormal   Collection Time    10/14/13  9:42 PM      Result Value Ref Range   Glucose-Capillary 151 (*) 70 - 99 mg/dL  GLUCOSE, CAPILLARY     Status: Abnormal   Collection Time    10/15/13  7:27 AM      Result Value Ref Range   Glucose-Capillary 132 (*) 70 - 99 mg/dL   Comment 1 Notify RN    GLUCOSE, CAPILLARY     Status: Abnormal   Collection Time    10/15/13 12:15 PM      Result Value Ref Range   Glucose-Capillary 148 (*) 70 - 99 mg/dL   Comment 1 Notify RN    GLUCOSE, CAPILLARY     Status: Abnormal   Collection Time    10/15/13  4:51 PM      Result Value Ref Range   Glucose-Capillary 103 (*) 70 - 99 mg/dL  GLUCOSE, CAPILLARY     Status: Abnormal   Collection Time    10/15/13  9:10 PM      Result Value Ref Range   Glucose-Capillary 155 (*) 70 - 99 mg/dL  CBC     Status: Abnormal   Collection Time    10/16/13  5:51 AM      Result Value Ref Range   WBC 12.2 (*) 4.0 - 10.5 K/uL   RBC 3.05 (*) 4.22 - 5.81 MIL/uL   Hemoglobin 7.6 (*) 13.0 - 17.0 g/dL   HCT 25.1 (*) 39.0 - 52.0 %   MCV 82.3  78.0 - 100.0 fL   MCH  24.9 (*) 26.0 - 34.0 pg   MCHC 30.3  30.0 - 36.0 g/dL   RDW 23.8 (*) 11.5 - 15.5 %   Platelets 374  150 - 400 K/uL     HEENT: poor oral hygiene, left facial weakness Cardio: RRR and no  murmur Resp: CTA B/L and unlabored GI: BS positive and NT, ND Extremity:  Pulses positive and No Edema Skin:   Intact and Rash erythematous macular with satellite lesions, no skin tear,  over entire perineal relative sparing of scrotum Neuro: Alert/Oriented, Abnormal Motor 2-/5 R delt bi tri grip, 2- L HF, RIght side normal strength, Tone:  Increased Reduced LT sensation R foot Flexor tone LUE and  Dysarthric Musc/Skel:  Other left AKA Gen NAD Ext R foot warm, intrinsic muscle atrophy,no lesions  Assessment/Plan: 1. Functional deficits secondary to Left AKA /PAD which require 3+ hours per day of interdisciplinary therapy in a comprehensive inpatient rehab setting. Physiatrist is providing close team supervision and 24 hour management of active medical problems listed below. Physiatrist and rehab team continue to assess barriers to discharge/monitor patient progress toward functional and medical goals. FIM: FIM - Bathing Bathing Steps Patient Completed: Chest;Left Arm;Abdomen;Front perineal area;Right upper leg Bathing: 1: Two helpers  FIM - Upper Body Dressing/Undressing Upper body dressing/undressing steps patient completed: Thread/unthread right sleeve of pullover shirt/dresss;Thread/unthread left sleeve of pullover shirt/dress;Put head through opening of pull over shirt/dress;Pull shirt over trunk Upper body dressing/undressing: 5: Supervision: Safety issues/verbal cues FIM - Lower Body Dressing/Undressing Lower body dressing/undressing steps patient completed: Thread/unthread right pants leg;Pull pants up/down;Don/Doff right shoe;Fasten/unfasten right shoe Lower body dressing/undressing: 1: Two helpers  FIM - Toileting Toileting steps completed by patient: Performs perineal hygiene Toileting: 2: Max-Patient completed 1 of 3 steps  FIM - Radio producer Devices: Recruitment consultant Transfers: 3-From toilet/BSC: Mod A (lift or lower assist);3-To toilet/BSC: Mod A (lift or lower assist)  FIM - Engineer, site Assistive Devices: Bed rails;Sliding board;Arm rests (better with slide board) Bed/Chair Transfer: 5: Supine > Sit: Supervision (verbal cues/safety issues);3: Bed > Chair or W/C: Mod A (lift or lower assist)  FIM - Locomotion: Wheelchair Distance: 150 Locomotion: Wheelchair: 5: Travels 150 ft or more: maneuvers on  rugs and over door sills with supervision, cueing or coaxing FIM - Locomotion: Ambulation Ambulation/Gait Assistance:  (limited time during eval.) Locomotion: Ambulation: 0: Activity did not occur  Comprehension Comprehension Mode: Auditory Comprehension: 5-Understands complex 90% of the time/Cues < 10% of the time  Expression Expression Mode: Verbal Expression: 5-Expresses complex 90% of the time/cues < 10% of the time  Social Interaction Social Interaction: 5-Interacts appropriately 90% of the time - Needs monitoring or encouragement for participation or interaction.  Problem Solving Problem Solving: 5-Solves basic problems: With no assist  Memory Memory: 6-More than reasonable amt of time  Medical Problem List and Plan:  1. 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 DVT Prophylaxis/Anticoagulation: Pharmaceutical: Lovenox  3. Pain Management: On oxycontin 40 mg bid. Oxycodone 10 mg prn breakthrough pain.  4. Depressed Mood: continue cymbalta. Team to provide ego support. LCSW to follow for evaluation and support.  5. Neuropsych: This patient is capable of making decisions on his own behalf.  6. HTN: Monitor every 8 hours. Lisinopril resumed today.  7. H/o rectal cancer: Has problems with rectal pain when constipated. No results with mag citrated. Will treat with SSE.  8. Chronic constipation: Will increase Senna S to 2 pills bid. Suppository every  24 hours if no BM.  9. 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 Continue Lantus 5 units and will titrate to home dose as po's improve.  10. Iron deficiency anemia: Loaded with IV iron. Continue po supplement. Check orthostatics, stool for OB, consider transfusion if symptomatic 11. H/o CVA with left spastic hemiparesis: Continue aggrenox.  12. Skin: large fungal rash saddle area. Add diflucan, continue local care per Reader. Air mattress   LOS (Days) 3 A FACE TO FACE EVALUATION  WAS PERFORMED  KIRSTEINS,ANDREW E 10/16/2013, 7:21 AM

## 2013-10-16 NOTE — Plan of Care (Signed)
Problem: RH SKIN INTEGRITY Goal: RH STG SKIN FREE OF INFECTION/BREAKDOWN With total assist Outcome: Not Progressing Foul odor from buttocks, two areas appear to be unstable pressure areas surrounded by fungal/yeast like area all over buttocks and yellow colored hardened area lft buttock cheek that extends to rectum; using microguard powder for now  Problem: RH PAIN MANAGEMENT Goal: RH STG PAIN MANAGED AT OR BELOW PT'S PAIN GOAL less than 2  Outcome: Not Progressing Goal changed

## 2013-10-17 ENCOUNTER — Inpatient Hospital Stay (HOSPITAL_COMMUNITY): Payer: Self-pay | Admitting: Occupational Therapy

## 2013-10-17 ENCOUNTER — Inpatient Hospital Stay (HOSPITAL_COMMUNITY): Payer: Self-pay | Admitting: Rehabilitation

## 2013-10-17 ENCOUNTER — Inpatient Hospital Stay (HOSPITAL_COMMUNITY): Payer: Self-pay | Admitting: Physical Therapy

## 2013-10-17 DIAGNOSIS — S78119A Complete traumatic amputation at level between unspecified hip and knee, initial encounter: Secondary | ICD-10-CM

## 2013-10-17 DIAGNOSIS — I739 Peripheral vascular disease, unspecified: Secondary | ICD-10-CM

## 2013-10-17 DIAGNOSIS — G8110 Spastic hemiplegia affecting unspecified side: Secondary | ICD-10-CM

## 2013-10-17 DIAGNOSIS — L98499 Non-pressure chronic ulcer of skin of other sites with unspecified severity: Secondary | ICD-10-CM

## 2013-10-17 DIAGNOSIS — E119 Type 2 diabetes mellitus without complications: Secondary | ICD-10-CM

## 2013-10-17 LAB — TYPE AND SCREEN
ABO/RH(D): O POS
Antibody Screen: NEGATIVE
Unit division: 0
Unit division: 0

## 2013-10-17 LAB — GLUCOSE, CAPILLARY
Glucose-Capillary: 125 mg/dL — ABNORMAL HIGH (ref 70–99)
Glucose-Capillary: 134 mg/dL — ABNORMAL HIGH (ref 70–99)
Glucose-Capillary: 112 mg/dL — ABNORMAL HIGH (ref 70–99)
Glucose-Capillary: 145 mg/dL — ABNORMAL HIGH (ref 70–99)

## 2013-10-17 LAB — CBC WITH DIFFERENTIAL/PLATELET
BASOS ABS: 0 10*3/uL (ref 0.0–0.1)
Basophils Relative: 0 % (ref 0–1)
EOS PCT: 0 % (ref 0–5)
Eosinophils Absolute: 0 10*3/uL (ref 0.0–0.7)
HEMATOCRIT: 31.3 % — AB (ref 39.0–52.0)
Hemoglobin: 10 g/dL — ABNORMAL LOW (ref 13.0–17.0)
LYMPHS ABS: 1.1 10*3/uL (ref 0.7–4.0)
Lymphocytes Relative: 9 % — ABNORMAL LOW (ref 12–46)
MCH: 26.3 pg (ref 26.0–34.0)
MCHC: 31.9 g/dL (ref 30.0–36.0)
MCV: 82.4 fL (ref 78.0–100.0)
MONOS PCT: 7 % (ref 3–12)
Monocytes Absolute: 0.8 10*3/uL (ref 0.1–1.0)
Neutro Abs: 10.1 10*3/uL — ABNORMAL HIGH (ref 1.7–7.7)
Neutrophils Relative %: 84 % — ABNORMAL HIGH (ref 43–77)
Platelets: 379 10*3/uL (ref 150–400)
RBC: 3.8 MIL/uL — ABNORMAL LOW (ref 4.22–5.81)
RDW: 21.3 % — AB (ref 11.5–15.5)
WBC: 12 10*3/uL — AB (ref 4.0–10.5)

## 2013-10-17 NOTE — Progress Notes (Signed)
Subjective/Complaints: 68 y.o. right-handed male with history of CVA with left-sided weakness, HTN, PVD with multiple revascularization procedures as well his recent thrombectomy of occluded FPBG with 4 compartment fasciotomy requiring VAC 5/ 2015. He was d/c from CIR on 09/24/13 and was ambulating at supervision level with RW. He started developing increasing LLE pain with onset of numbness 10/07/13 due to recurrent thrombosis left femoral popliteal bypass. Patient has refused chronic coumadin therapy as well as removal of infected L-axillofemoral BG. High L-AKA recommended due to level of demarcation. On 10/08/13, patient underwent L-AKA by Dr. Trula Slade and post op with severe depressive symptoms with lability as well as poor pain control. He was started on Oxycontin for more pain management as well as Toradol. Was treated with IV iron infusion for anemia. Pschiatry consulted for input and patient started on Cymbalta for severe acute depressive symptoms  Left leg, buttocks tender. Worked with therapy. Trying to stay positive  Review of Systems -  Objective: Vital Signs: Blood pressure 160/75, pulse 84, temperature 97.8 F (36.6 C), temperature source Oral, resp. rate 18, weight 75.137 kg (165 lb 10.4 oz), SpO2 95.00%. No results found. Results for orders placed during the hospital encounter of 10/13/13 (from the past 72 hour(s))  GLUCOSE, CAPILLARY     Status: Abnormal   Collection Time    10/14/13 11:21 AM      Result Value Ref Range   Glucose-Capillary 136 (*) 70 - 99 mg/dL   Comment 1 Notify RN    GLUCOSE, CAPILLARY     Status: Abnormal   Collection Time    10/14/13  4:45 PM      Result Value Ref Range   Glucose-Capillary 116 (*) 70 - 99 mg/dL  GLUCOSE, CAPILLARY     Status: Abnormal   Collection Time    10/14/13  9:42 PM      Result Value Ref Range   Glucose-Capillary 151 (*) 70 - 99 mg/dL  GLUCOSE, CAPILLARY     Status: Abnormal   Collection Time    10/15/13  7:27 AM       Result Value Ref Range   Glucose-Capillary 132 (*) 70 - 99 mg/dL   Comment 1 Notify RN    GLUCOSE, CAPILLARY     Status: Abnormal   Collection Time    10/15/13 12:15 PM      Result Value Ref Range   Glucose-Capillary 148 (*) 70 - 99 mg/dL   Comment 1 Notify RN    GLUCOSE, CAPILLARY     Status: Abnormal   Collection Time    10/15/13  4:51 PM      Result Value Ref Range   Glucose-Capillary 103 (*) 70 - 99 mg/dL  GLUCOSE, CAPILLARY     Status: Abnormal   Collection Time    10/15/13  9:10 PM      Result Value Ref Range   Glucose-Capillary 155 (*) 70 - 99 mg/dL  CBC     Status: Abnormal   Collection Time    10/16/13  5:51 AM      Result Value Ref Range   WBC 12.2 (*) 4.0 - 10.5 K/uL   RBC 3.05 (*) 4.22 - 5.81 MIL/uL   Hemoglobin 7.6 (*) 13.0 - 17.0 g/dL   HCT 25.1 (*) 39.0 - 52.0 %   MCV 82.3  78.0 - 100.0 fL   MCH 24.9 (*) 26.0 - 34.0 pg   MCHC 30.3  30.0 - 36.0 g/dL   RDW 23.8 (*) 11.5 -  15.5 %   Platelets 374  150 - 400 K/uL  GLUCOSE, CAPILLARY     Status: Abnormal   Collection Time    10/16/13  7:34 AM      Result Value Ref Range   Glucose-Capillary 107 (*) 70 - 99 mg/dL   Comment 1 Notify RN    TYPE AND SCREEN     Status: None   Collection Time    10/16/13 10:35 AM      Result Value Ref Range   ABO/RH(D) O POS     Antibody Screen NEG     Sample Expiration 10/19/2013     Unit Number [id number]     Blood Component Type RBC LR PHER1     Unit division 00     Status of Unit ISSUED     Transfusion Status OK TO TRANSFUSE     Crossmatch Result Compatible     Unit Number [id number]     Blood Component Type RBC LR PHER1     Unit division 00     Status of Unit ISSUED     Transfusion Status OK TO TRANSFUSE     Crossmatch Result Compatible    PREPARE RBC (CROSSMATCH)     Status: None   Collection Time    10/16/13 10:35 AM      Result Value Ref Range   Order Confirmation ORDER PROCESSED BY BLOOD BANK    GLUCOSE, CAPILLARY     Status: Abnormal   Collection Time     10/16/13 11:35 AM      Result Value Ref Range   Glucose-Capillary 131 (*) 70 - 99 mg/dL   Comment 1 Notify RN    GLUCOSE, CAPILLARY     Status: Abnormal   Collection Time    10/16/13  4:28 PM      Result Value Ref Range   Glucose-Capillary 157 (*) 70 - 99 mg/dL   Comment 1 Notify RN    URINALYSIS, ROUTINE W REFLEX MICROSCOPIC     Status: Abnormal   Collection Time    10/16/13  7:54 PM      Result Value Ref Range   Color, Urine YELLOW  YELLOW   APPearance CLEAR  CLEAR   Specific Gravity, Urine 1.019  1.005 - 1.030   pH 6.0  5.0 - 8.0   Glucose, UA NEGATIVE  NEGATIVE mg/dL   Hgb urine dipstick NEGATIVE  NEGATIVE   Bilirubin Urine NEGATIVE  NEGATIVE   Ketones, ur 15 (*) NEGATIVE mg/dL   Protein, ur NEGATIVE  NEGATIVE mg/dL   Urobilinogen, UA 0.2  0.0 - 1.0 mg/dL   Nitrite NEGATIVE  NEGATIVE   Leukocytes, UA NEGATIVE  NEGATIVE   Comment: MICROSCOPIC NOT DONE ON URINES WITH NEGATIVE PROTEIN, BLOOD, LEUKOCYTES, NITRITE, OR GLUCOSE <1000 mg/dL.  GLUCOSE, CAPILLARY     Status: Abnormal   Collection Time    10/16/13  8:50 PM      Result Value Ref Range   Glucose-Capillary 128 (*) 70 - 99 mg/dL  CBC WITH DIFFERENTIAL     Status: Abnormal   Collection Time    10/17/13  4:25 AM      Result Value Ref Range   WBC 12.0 (*) 4.0 - 10.5 K/uL   RBC 3.80 (*) 4.22 - 5.81 MIL/uL   Hemoglobin 10.0 (*) 13.0 - 17.0 g/dL   Comment: DELTA CHECK NOTED     POST TRANSFUSION SPECIMEN   HCT 31.3 (*) 39.0 - 52.0 %   MCV 82.4  78.0 - 100.0 fL   MCH 26.3  26.0 - 34.0 pg   MCHC 31.9  30.0 - 36.0 g/dL   RDW 21.3 (*) 11.5 - 15.5 %   Platelets 379  150 - 400 K/uL   Neutrophils Relative % 84 (*) 43 - 77 %   Lymphocytes Relative 9 (*) 12 - 46 %   Monocytes Relative 7  3 - 12 %   Eosinophils Relative 0  0 - 5 %   Basophils Relative 0  0 - 1 %   Neutro Abs 10.1 (*) 1.7 - 7.7 K/uL   Lymphs Abs 1.1  0.7 - 4.0 K/uL   Monocytes Absolute 0.8  0.1 - 1.0 K/uL   Eosinophils Absolute 0.0  0.0 - 0.7 K/uL    Basophils Absolute 0.0  0.0 - 0.1 K/uL   RBC Morphology POLYCHROMASIA PRESENT     Comment: ELLIPTOCYTES   WBC Morphology MILD LEFT SHIFT (1-5% METAS, OCC MYELO, OCC BANDS)    GLUCOSE, CAPILLARY     Status: Abnormal   Collection Time    10/17/13  7:27 AM      Result Value Ref Range   Glucose-Capillary 125 (*) 70 - 99 mg/dL   Comment 1 Notify RN       HEENT: poor oral hygiene, left facial weakness Cardio: RRR and no  murmur Resp: CTA B/L and unlabored GI: BS positive and NT, ND Extremity:  Pulses positive and No Edema Skin:   Intact and Rash erythematous macular with satellite lesions, no skin tear,  over entire perineal relative sparing of scrotum Neuro: Alert/Oriented, Abnormal Motor 2-/5 R delt bi tri grip, 2- L HF, RIght side normal strength, Tone:  Increased Reduced LT sensation R foot Flexor tone LUE and Dysarthric Musc/Skel:  Other left AKA Gen NAD Ext R foot warm, intrinsic muscle atrophy,no lesions  Assessment/Plan: 1. Functional deficits secondary to Left AKA /PAD which require 3+ hours per day of interdisciplinary therapy in a comprehensive inpatient rehab setting. Physiatrist is providing close team supervision and 24 hour management of active medical problems listed below. Physiatrist and rehab team continue to assess barriers to discharge/monitor patient progress toward functional and medical goals. FIM: FIM - Bathing Bathing Steps Patient Completed: Chest;Left Arm;Abdomen;Front perineal area;Right upper leg;Right lower leg (including foot) Bathing: 4: Min-Patient completes 8-9 66f 10 parts or 75+ percent  FIM - Upper Body Dressing/Undressing Upper body dressing/undressing steps patient completed: Thread/unthread right sleeve of pullover shirt/dresss;Thread/unthread left sleeve of pullover shirt/dress;Put head through opening of pull over shirt/dress;Pull shirt over trunk Upper body dressing/undressing: 5: Supervision: Safety issues/verbal cues FIM - Lower Body  Dressing/Undressing Lower body dressing/undressing steps patient completed: Thread/unthread right pants leg;Don/Doff right shoe;Fasten/unfasten right shoe;Don/Doff right sock Lower body dressing/undressing: 2: Max-Patient completed 25-49% of tasks  FIM - Toileting Toileting steps completed by patient: Performs perineal hygiene Toileting: 2: Max-Patient completed 1 of 3 steps  FIM - Radio producer Devices: Bedside commode Toilet Transfers: 3-From toilet/BSC: Mod A (lift or lower assist);3-To toilet/BSC: Mod A (lift or lower assist)  FIM - Control and instrumentation engineer Devices: Sliding board Bed/Chair Transfer: 5: Supine > Sit: Supervision (verbal cues/safety issues);3: Chair or W/C > Bed: Mod A (lift or lower assist) (Simultaneous filing. User may not have seen previous data.)  FIM - Locomotion: Wheelchair Distance: 150 Locomotion: Wheelchair: 0: Activity did not occur FIM - Locomotion: Ambulation Ambulation/Gait Assistance:  (limited time during eval.) Locomotion: Ambulation: 0: Activity did not occur  Comprehension Comprehension  Mode: Auditory Comprehension: 5-Understands basic 90% of the time/requires cueing < 10% of the time  Expression Expression Mode: Verbal Expression: 5-Expresses basic 90% of the time/requires cueing < 10% of the time.  Social Interaction Social Interaction: 5-Interacts appropriately 90% of the time - Needs monitoring or encouragement for participation or interaction.  Problem Solving Problem Solving: 5-Solves basic 90% of the time/requires cueing < 10% of the time  Memory Memory: 6-More than reasonable amt of time  Medical Problem List and Plan:  1. 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 DVT Prophylaxis/Anticoagulation: Pharmaceutical: Lovenox  3. Pain Management: On oxycontin 40 mg bid. Oxycodone 10 mg prn breakthrough pain.  4. Depressed Mood: continue cymbalta.   -gave pt a "pep  talk" today  5. Neuropsych: This patient is capable of making decisions on his own behalf.  6. HTN: Monitor every 8 hours. Lisinopril resumed today.  7. H/o rectal cancer: Has problems with rectal pain when constipated. No results with mag citrated. Will treat with SSE.  8. Chronic constipation: Will increase Senna S to 2 pills bid. Suppository every 24 hours if no BM.  9. 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 Continue Lantus 5 units and will titrate to home as indicated 10. Iron deficiency anemia: Loaded with IV iron. Continue po supplement. Check orthostatics, stool for OB, consider transfusion if symptomatic 11. H/o CVA with left spastic hemiparesis: Continue aggrenox.  12. Skin: large fungal rash saddle area. Added diflucan, continue local care per [place]. Air mattress   LOS (Days) 4 A FACE TO FACE EVALUATION WAS PERFORMED  SWARTZ,ZACHARY T 10/17/2013, 8:49 AM

## 2013-10-17 NOTE — Progress Notes (Signed)
Physical Therapy Session Note  Patient Details  Name: Donald [name] MRN: [id number] Date of Birth: 1945/10/30  Today's Date: 10/17/2013 Time: 2482-5003 Time Calculation (min): 38 min  Short Term Goals: Week 1:  PT Short Term Goal 1 (Week 1): Pt will perform bed mobility with S with HOB flat and without bed rails PT Short Term Goal 2 (Week 1): Pt will perform dynamic standing x 3 mins at min A level with RUE supported PT Short Term Goal 3 (Week 1): Pt will self propel w/c x 150' using R hemi technique at S level PT Short Term Goal 4 (Week 1): Pt will verbalize pressure relief techniques with min cues  Skilled Therapeutic Interventions/Progress Updates:   Pt resting in bed.  Assisted pt with wrapping L limb secondary to shrinker being damp.  Also donned shorts and shoe with assistance.  Pt transferred to EOB with min A for balance and to weight shift fully to L.  Performed sit > stand with RW and mod A to maintain LUE on RW to pull shorts up fully.  Performed lateral scooting on slideboard bed >w/c to L with mod A over the back technique to facilitate use of head/hips relationship to unweight and scoot/rotate hips to L in w/c.  Pt did improve his ability to lift and scoot his buttocks vs. Slide to minimize sheer on buttocks.  RN requesting orthostatic vitals in sitting and standing.  Assessed BP, HR and Sp02 in sitting and then in standing with UE support on RW and mod A.  Pt positioned in w/c with all items within reach.    Therapy Documentation Precautions:  Precautions Precautions: Fall Precaution Comments: LT AKA wrap, pt is anxious with mobility, history of Left hemiparesis and increased tone. Restrictions Weight Bearing Restrictions: No Pain: Pain Assessment Pain Assessment: No/denies pain Faces Pain Scale: Hurts little more Pain Type: Acute pain Pain Location: Buttocks Pain Orientation: Mid Pain Descriptors / Indicators: Aching Pain Onset: On-going Pain Intervention(s):  Repositioned  See FIM for current functional status  Therapy/Group: Individual Therapy  Raylene Everts [facility] 10/17/2013, 12:15 PM

## 2013-10-17 NOTE — Progress Notes (Signed)
Physical Therapy Session Note  Patient Details  Name: Donald [name] MRN: [id number] Date of Birth: 1945/11/09  Today's Date: 10/17/2013 Time: 1100-1145 Time Calculation (min): 45 min  Short Term Goals: Week 1:  PT Short Term Goal 1 (Week 1): Pt will perform bed mobility with S with HOB flat and without bed rails PT Short Term Goal 2 (Week 1): Pt will perform dynamic standing x 3 mins at min A level with RUE supported PT Short Term Goal 3 (Week 1): Pt will self propel w/c x 150' using R hemi technique at S level PT Short Term Goal 4 (Week 1): Pt will verbalize pressure relief techniques with min cues  Skilled Therapeutic Interventions/Progress Updates:   Pt received sitting in w/c, agreeable to therapy with increased c/o pain from buttocks.  Pt self propelled x 150' to therapy gym using R hemi technique at S level with min cues for scanning L environment.  Performed stand pivot transfer w/c <> mat at mod A level with continued cues for safe hand placement, rather than "bear hugging" therapist.  Focused on sit <> stand with use of RW, then added L platform for increased functional use and WB through LUE.  Tolerated well at mod A.  Remainder of session focused on gait with new L PFRW.  Pt able to stand with light mod A and took 4-5 hops x 3 reps with +2 assist for close w/c follow.  Pt with decreased endurance during activity and unable to continue more than a few feet at a time.  Provided max encouragement to amount of success this session, however pt continues to state "I don't think that was very good."  Assisted pt back to room to sit up in recliner.  Continue to provide cues for safety during transfer, as pt attempted to grab onto oxygen tank for UE support.  Pt left with all needs in reach.    Pt initially on 3L O2 with SaO2 at 94-95%, therefore decreased to 2L, however during gait would drop to 87-88%, but with rest would increase to 90's.    Therapy Documentation Precautions:   Precautions Precautions: Fall Precaution Comments: LT AKA wrap, pt is anxious with mobility, history of Left hemiparesis and increased tone. Restrictions Weight Bearing Restrictions: No   Vital Signs: Oxygen Therapy SpO2: 84 % (in standing) O2 Device: None (Room air) Pain: Pain Assessment Pain Assessment: No/denies pain Pain Score: 0-No pain Faces Pain Scale: Hurts little more Pain Type: Acute pain Pain Location: Buttocks Pain Orientation: Mid Pain Descriptors / Indicators: Aching Pain Onset: On-going Pain Intervention(s): Repositioned   Locomotion : Ambulation Ambulation/Gait Assistance: 1: +2 Total assist (Mod w/ +2 for chair follow) Wheelchair Mobility Distance: 150   See FIM for current functional status  Therapy/Group: Individual Therapy  Denice Bors 10/17/2013, 12:33 PM

## 2013-10-17 NOTE — Progress Notes (Signed)
Occupational Therapy Session Note  Patient Details  Name: Donald [name] MRN: [id number] Date of Birth: August 19, 1945  Today's Date: 10/17/2013 Time: 1300-1357 Time Calculation (min): 57 min  Second Session: Time:  14:34-15:20 Time Calculation (min): 46 min  Skilled Therapeutic Interventions/Progress Updates:   First Session:  Pt worked on bathing and dressing sit to stand at the sink during this session.  He was able to perform all UB dressing with supervision but still needs min assist for UB bathing secondary to history of left hemiparesis and needing max facilitation to incorporate the LUE.  Pt incontinent of stool in the chair as well after completing bathing.  He needed mod assist for squat pivot transfer to the 3:1 over the toilet with total +2 (pt 40%) for multiple standing attempts in order to clean his peri area and apply powder and creams.  Mod assist to transfer back to the wheelchair.  O2 sats 92% on room air during the entire session.    Second Session:  Took pt down to the gym and transferred to the therapy mat with mod assist.  Had pt lay on his left side and perform scapular mobilizations and then had him work on active shoulder depression without overcompensation using his whole body.  He continues to need max instructional cueing to complete small scapular movements without compensation.  Pt then transitioned to sitting where therapist performed stretching to elbow flexors and digit flexors and placed his hand on a hand splint to keep fingers from flexing.  Had pt work on left lateral trunk shortening while attempting to have him assist with push with the LUE against the mat surface.  Pt needs mod facilitation to achieve slight lateral weightshift and not lose his hand contact with the surface of the mat.  Progressed to using sliding board and working on active elbow extension with slight shoulder abduction.  He needed mod assist to complete full repetitions of elbow extension.    Therapy Documentation Precautions:  Precautions Precautions: Fall Precaution Comments: LT AKA wrap, pt is anxious with mobility, history of Left hemiparesis and increased tone. Restrictions Weight Bearing Restrictions: No  Pain: Pain Assessment Pain Assessment: Faces Pain Score: 8  Faces Pain Scale: Hurts little more Pain Type: Acute pain Pain Location: Buttocks Pain Orientation: Mid Pain Descriptors / Indicators: Burning Pain Frequency: Intermittent Pain Onset: On-going Patients Stated Pain Goal: 2 Pain Intervention(s): Medication (See eMAR);RN made aware;Distraction;Repositioned ADL: ADL ADL Comments: see FIM  See FIM for current functional status  Therapy/Group: Individual Therapy  Jayna Mulnix OTR/L 10/17/2013, 3:53 PM

## 2013-10-18 ENCOUNTER — Inpatient Hospital Stay (HOSPITAL_COMMUNITY): Payer: Self-pay | Admitting: Physical Therapy

## 2013-10-18 DIAGNOSIS — S78119A Complete traumatic amputation at level between unspecified hip and knee, initial encounter: Secondary | ICD-10-CM

## 2013-10-18 DIAGNOSIS — E119 Type 2 diabetes mellitus without complications: Secondary | ICD-10-CM

## 2013-10-18 DIAGNOSIS — L98499 Non-pressure chronic ulcer of skin of other sites with unspecified severity: Secondary | ICD-10-CM

## 2013-10-18 DIAGNOSIS — G8110 Spastic hemiplegia affecting unspecified side: Secondary | ICD-10-CM

## 2013-10-18 DIAGNOSIS — I739 Peripheral vascular disease, unspecified: Secondary | ICD-10-CM

## 2013-10-18 LAB — GLUCOSE, CAPILLARY
Glucose-Capillary: 128 mg/dL — ABNORMAL HIGH (ref 70–99)
Glucose-Capillary: 134 mg/dL — ABNORMAL HIGH (ref 70–99)
Glucose-Capillary: 134 mg/dL — ABNORMAL HIGH (ref 70–99)
Glucose-Capillary: 149 mg/dL — ABNORMAL HIGH (ref 70–99)

## 2013-10-18 MED ORDER — OXYCODONE HCL ER 15 MG PO T12A
50.0000 mg | EXTENDED_RELEASE_TABLET | Freq: Two times a day (BID) | ORAL | Status: DC
  Administered 2013-10-18 – 2013-10-19 (×3): 50 mg via ORAL
  Filled 2013-10-18 (×6): qty 2

## 2013-10-18 MED ORDER — OXYCODONE HCL ER 10 MG PO T12A
10.0000 mg | EXTENDED_RELEASE_TABLET | Freq: Once | ORAL | Status: AC
  Administered 2013-10-18: 10 mg via ORAL
  Filled 2013-10-18: qty 1

## 2013-10-18 NOTE — Progress Notes (Signed)
Subjective/Complaints: 68 y.o. right-handed male with history of CVA with left-sided weakness, HTN, PVD with multiple revascularization procedures as well his recent thrombectomy of occluded FPBG with 4 compartment fasciotomy requiring VAC 5/ 2015. He was d/c from CIR on 09/24/13 and was ambulating at supervision level with RW. He started developing increasing LLE pain with onset of numbness 10/07/13 due to recurrent thrombosis left femoral popliteal bypass. Patient has refused chronic coumadin therapy as well as removal of infected L-axillofemoral BG. High L-AKA recommended due to level of demarcation. On 10/08/13, patient underwent L-AKA by Dr. Trula Slade and post op with severe depressive symptoms with lability as well as poor pain control. He was started on Oxycontin for more pain management as well as Toradol. Was treated with IV iron infusion for anemia. Pschiatry consulted for input and patient started on Cymbalta for severe acute depressive symptoms  Left leg, buttocks remain tender. Restless last night.   Review of Systems -  Objective: Vital Signs: Blood pressure 148/76, pulse 99, temperature 98.7 F (37.1 C), temperature source Oral, resp. rate 20, weight 75.137 kg (165 lb 10.4 oz), SpO2 93.00%. No results found. Results for orders placed during the hospital encounter of 10/13/13 (from the past 72 hour(s))  GLUCOSE, CAPILLARY     Status: Abnormal   Collection Time    10/15/13 12:15 PM      Result Value Ref Range   Glucose-Capillary 148 (*) 70 - 99 mg/dL   Comment 1 Notify RN    GLUCOSE, CAPILLARY     Status: Abnormal   Collection Time    10/15/13  4:51 PM      Result Value Ref Range   Glucose-Capillary 103 (*) 70 - 99 mg/dL  GLUCOSE, CAPILLARY     Status: Abnormal   Collection Time    10/15/13  9:10 PM      Result Value Ref Range   Glucose-Capillary 155 (*) 70 - 99 mg/dL  CBC     Status: Abnormal   Collection Time    10/16/13  5:51 AM      Result Value Ref Range   WBC 12.2  (*) 4.0 - 10.5 K/uL   RBC 3.05 (*) 4.22 - 5.81 MIL/uL   Hemoglobin 7.6 (*) 13.0 - 17.0 g/dL   HCT 25.1 (*) 39.0 - 52.0 %   MCV 82.3  78.0 - 100.0 fL   MCH 24.9 (*) 26.0 - 34.0 pg   MCHC 30.3  30.0 - 36.0 g/dL   RDW 23.8 (*) 11.5 - 15.5 %   Platelets 374  150 - 400 K/uL  GLUCOSE, CAPILLARY     Status: Abnormal   Collection Time    10/16/13  7:34 AM      Result Value Ref Range   Glucose-Capillary 107 (*) 70 - 99 mg/dL   Comment 1 Notify RN    TYPE AND SCREEN     Status: None   Collection Time    10/16/13 10:35 AM      Result Value Ref Range   ABO/RH(D) O POS     Antibody Screen NEG     Sample Expiration 10/19/2013     Unit Number [id number]     Blood Component Type RBC LR PHER1     Unit division 00     Status of Unit ISSUED,FINAL     Transfusion Status OK TO TRANSFUSE     Crossmatch Result Compatible     Unit Number [id number]     Blood Component Type RBC LR  PHER1     Unit division 00     Status of Unit ISSUED,FINAL     Transfusion Status OK TO TRANSFUSE     Crossmatch Result Compatible    PREPARE RBC (CROSSMATCH)     Status: None   Collection Time    10/16/13 10:35 AM      Result Value Ref Range   Order Confirmation ORDER PROCESSED BY BLOOD BANK    GLUCOSE, CAPILLARY     Status: Abnormal   Collection Time    10/16/13 11:35 AM      Result Value Ref Range   Glucose-Capillary 131 (*) 70 - 99 mg/dL   Comment 1 Notify RN    GLUCOSE, CAPILLARY     Status: Abnormal   Collection Time    10/16/13  4:28 PM      Result Value Ref Range   Glucose-Capillary 157 (*) 70 - 99 mg/dL   Comment 1 Notify RN    URINALYSIS, ROUTINE W REFLEX MICROSCOPIC     Status: Abnormal   Collection Time    10/16/13  7:54 PM      Result Value Ref Range   Color, Urine YELLOW  YELLOW   APPearance CLEAR  CLEAR   Specific Gravity, Urine 1.019  1.005 - 1.030   pH 6.0  5.0 - 8.0   Glucose, UA NEGATIVE  NEGATIVE mg/dL   Hgb urine dipstick NEGATIVE  NEGATIVE   Bilirubin Urine NEGATIVE  NEGATIVE    Ketones, ur 15 (*) NEGATIVE mg/dL   Protein, ur NEGATIVE  NEGATIVE mg/dL   Urobilinogen, UA 0.2  0.0 - 1.0 mg/dL   Nitrite NEGATIVE  NEGATIVE   Leukocytes, UA NEGATIVE  NEGATIVE   Comment: MICROSCOPIC NOT DONE ON URINES WITH NEGATIVE PROTEIN, BLOOD, LEUKOCYTES, NITRITE, OR GLUCOSE <1000 mg/dL.  GLUCOSE, CAPILLARY     Status: Abnormal   Collection Time    10/16/13  8:50 PM      Result Value Ref Range   Glucose-Capillary 128 (*) 70 - 99 mg/dL  CBC WITH DIFFERENTIAL     Status: Abnormal   Collection Time    10/17/13  4:25 AM      Result Value Ref Range   WBC 12.0 (*) 4.0 - 10.5 K/uL   RBC 3.80 (*) 4.22 - 5.81 MIL/uL   Hemoglobin 10.0 (*) 13.0 - 17.0 g/dL   Comment: DELTA CHECK NOTED     POST TRANSFUSION SPECIMEN   HCT 31.3 (*) 39.0 - 52.0 %   MCV 82.4  78.0 - 100.0 fL   MCH 26.3  26.0 - 34.0 pg   MCHC 31.9  30.0 - 36.0 g/dL   RDW 21.3 (*) 11.5 - 15.5 %   Platelets 379  150 - 400 K/uL   Neutrophils Relative % 84 (*) 43 - 77 %   Lymphocytes Relative 9 (*) 12 - 46 %   Monocytes Relative 7  3 - 12 %   Eosinophils Relative 0  0 - 5 %   Basophils Relative 0  0 - 1 %   Neutro Abs 10.1 (*) 1.7 - 7.7 K/uL   Lymphs Abs 1.1  0.7 - 4.0 K/uL   Monocytes Absolute 0.8  0.1 - 1.0 K/uL   Eosinophils Absolute 0.0  0.0 - 0.7 K/uL   Basophils Absolute 0.0  0.0 - 0.1 K/uL   RBC Morphology POLYCHROMASIA PRESENT     Comment: ELLIPTOCYTES   WBC Morphology MILD LEFT SHIFT (1-5% METAS, OCC MYELO, OCC BANDS)  GLUCOSE, CAPILLARY     Status: Abnormal   Collection Time    10/17/13  7:27 AM      Result Value Ref Range   Glucose-Capillary 125 (*) 70 - 99 mg/dL   Comment 1 Notify RN    GLUCOSE, CAPILLARY     Status: Abnormal   Collection Time    10/17/13 11:36 AM      Result Value Ref Range   Glucose-Capillary 134 (*) 70 - 99 mg/dL   Comment 1 Notify RN    GLUCOSE, CAPILLARY     Status: Abnormal   Collection Time    10/17/13  4:21 PM      Result Value Ref Range   Glucose-Capillary 112 (*) 70  - 99 mg/dL   Comment 1 Notify RN    GLUCOSE, CAPILLARY     Status: Abnormal   Collection Time    10/17/13  8:33 PM      Result Value Ref Range   Glucose-Capillary 145 (*) 70 - 99 mg/dL  GLUCOSE, CAPILLARY     Status: Abnormal   Collection Time    10/18/13  7:12 AM      Result Value Ref Range   Glucose-Capillary 128 (*) 70 - 99 mg/dL   Comment 1 Notify RN       HEENT: poor oral hygiene, left facial weakness Cardio: RRR and no  murmur Resp: CTA B/L and unlabored GI: BS positive and NT, ND Extremity:  Pulses positive and No Edema Skin:   Intact and Rash erythematous macular with satellite lesions, no skin tear,  over entire perineal relative sparing of scrotum Neuro: Alert/Oriented, Abnormal Motor 2-/5 R delt bi tri grip, 2- L HF, RIght side normal strength, Tone:  Increased Reduced LT sensation R foot Flexor tone LUE and Dysarthric Musc/Skel:  Other left AKA Gen NAD Ext R foot warm, intrinsic muscle atrophy,no lesions  Assessment/Plan: 1. Functional deficits secondary to Left AKA /PAD which require 3+ hours per day of interdisciplinary therapy in a comprehensive inpatient rehab setting. Physiatrist is providing close team supervision and 24 hour management of active medical problems listed below. Physiatrist and rehab team continue to assess barriers to discharge/monitor patient progress toward functional and medical goals. FIM: FIM - Bathing Bathing Steps Patient Completed: Chest;Left Arm;Abdomen;Front perineal area;Right upper leg;Right lower leg (including foot) Bathing: 4: Min-Patient completes 8-9 54f 10 parts or 75+ percent  FIM - Upper Body Dressing/Undressing Upper body dressing/undressing steps patient completed: Thread/unthread right sleeve of pullover shirt/dresss;Thread/unthread left sleeve of pullover shirt/dress;Put head through opening of pull over shirt/dress;Pull shirt over trunk Upper body dressing/undressing: 6: More than reasonable amount of time FIM - Lower  Body Dressing/Undressing Lower body dressing/undressing steps patient completed: Thread/unthread right pants leg;Don/Doff right shoe;Fasten/unfasten right shoe;Don/Doff right sock Lower body dressing/undressing: 2: Max-Patient completed 25-49% of tasks  FIM - Toileting Toileting steps completed by patient: Performs perineal hygiene Toileting: 1: Two helpers  FIM - Radio producer Devices: Recruitment consultant Transfers: 3-To toilet/BSC: Mod A (lift or lower assist);3-From toilet/BSC: Mod A (lift or lower assist)  FIM - Bed/Chair Transfer Bed/Chair Transfer Assistive Devices: Arm rests Bed/Chair Transfer: 3: Bed > Chair or W/C: Mod A (lift or lower assist);3: Chair or W/C > Bed: Mod A (lift or lower assist) (w/c <> mat)  FIM - Locomotion: Wheelchair Distance: 150 Locomotion: Wheelchair: 5: Travels 150 ft or more: maneuvers on rugs and over door sills with supervision, cueing or coaxing FIM - Locomotion: Ambulation Locomotion: Ambulation  Assistive Devices: Engineer, agricultural Ambulation/Gait Assistance: 1: +2 Total assist (Mod w/ +2 for chair follow) Locomotion: Ambulation: 1: Two helpers  Comprehension Comprehension Mode: Auditory Comprehension: 5-Understands basic 90% of the time/requires cueing < 10% of the time  Expression Expression Mode: Verbal Expression: 5-Expresses complex 90% of the time/cues < 10% of the time  Social Interaction Social Interaction: 5-Interacts appropriately 90% of the time - Needs monitoring or encouragement for participation or interaction.  Problem Solving Problem Solving: 5-Solves complex 90% of the time/cues < 10% of the time  Memory Memory: 6-More than reasonable amt of time  Medical Problem List and Plan:  1. 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 DVT Prophylaxis/Anticoagulation: Pharmaceutical: Lovenox  3. Pain Management: On oxycontin 40 mg bid---increase to 50mg  bid. Oxycodone 10 mg prn  breakthrough pain.  4. Depressed Mood: continue cymbalta.   -still a major issue  5. Neuropsych: This patient is capable of making decisions on his own behalf.  6. HTN: Monitor every 8 hours. Lisinopril resumed today.  7. H/o rectal cancer: Has problems with rectal pain when constipated. No results with mag citrated. Will treat with SSE.  8. Chronic constipation: Will increase Senna S to 2 pills bid. Suppository every 24 hours if no BM.  9. 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 Continue Lantus 5 units and will titrate to home as indicated 10. Iron deficiency anemia: Loaded with IV iron. Continue po supplement. Check orthostatics, stool for OB, consider transfusion if symptomatic 11. H/o CVA with left spastic hemiparesis: Continue aggrenox.  12. Skin: large fungal rash saddle area. Added diflucan, continue local care per [place]. Air mattress  -reconsult [facility] for further recs  -will work on pain control   LOS (Days) 5 A FACE TO FACE EVALUATION WAS PERFORMED  SWARTZ,ZACHARY T 10/18/2013, 8:40 AM

## 2013-10-18 NOTE — Progress Notes (Signed)
Patient refused standing orthostatic vital signs at midnight. He stated it was too painful. adm

## 2013-10-18 NOTE — Plan of Care (Signed)
Problem: RH PAIN MANAGEMENT Goal: RH STG PAIN MANAGED AT OR BELOW PT'S PAIN GOAL less than 6  Outcome: Not Progressing Patient rate Pain 8-10. Prn medications given at hs.adm

## 2013-10-19 ENCOUNTER — Inpatient Hospital Stay (HOSPITAL_COMMUNITY): Payer: Self-pay | Admitting: Rehabilitation

## 2013-10-19 ENCOUNTER — Inpatient Hospital Stay (HOSPITAL_COMMUNITY): Payer: Medicare Other

## 2013-10-19 ENCOUNTER — Encounter (HOSPITAL_COMMUNITY): Payer: Self-pay

## 2013-10-19 ENCOUNTER — Inpatient Hospital Stay (HOSPITAL_COMMUNITY): Payer: Self-pay

## 2013-10-19 DIAGNOSIS — I739 Peripheral vascular disease, unspecified: Secondary | ICD-10-CM

## 2013-10-19 DIAGNOSIS — G8110 Spastic hemiplegia affecting unspecified side: Secondary | ICD-10-CM

## 2013-10-19 DIAGNOSIS — E119 Type 2 diabetes mellitus without complications: Secondary | ICD-10-CM

## 2013-10-19 DIAGNOSIS — L98499 Non-pressure chronic ulcer of skin of other sites with unspecified severity: Secondary | ICD-10-CM

## 2013-10-19 DIAGNOSIS — S78119A Complete traumatic amputation at level between unspecified hip and knee, initial encounter: Secondary | ICD-10-CM

## 2013-10-19 LAB — GLUCOSE, CAPILLARY
GLUCOSE-CAPILLARY: 134 mg/dL — AB (ref 70–99)
GLUCOSE-CAPILLARY: 144 mg/dL — AB (ref 70–99)
Glucose-Capillary: 187 mg/dL — ABNORMAL HIGH (ref 70–99)
Glucose-Capillary: 130 mg/dL — ABNORMAL HIGH (ref 70–99)
Glucose-Capillary: 199 mg/dL — ABNORMAL HIGH (ref 70–99)

## 2013-10-19 MED ORDER — GERHARDT'S BUTT CREAM
TOPICAL_CREAM | Freq: Two times a day (BID) | CUTANEOUS | Status: DC
  Administered 2013-10-19 – 2013-10-20 (×4): via TOPICAL
  Administered 2013-10-21: 1 via TOPICAL
  Administered 2013-10-21 – 2013-10-28 (×10): via TOPICAL
  Filled 2013-10-19 (×3): qty 1

## 2013-10-19 NOTE — Progress Notes (Signed)
Subjective/Complaints: 68 y.o. right-handed male with history of CVA with left-sided weakness, HTN, PVD with multiple revascularization procedures as well his recent thrombectomy of occluded FPBG with 4 compartment fasciotomy requiring VAC 5/ 2015. He was d/c from CIR on 09/24/13 and was ambulating at supervision level with RW. He started developing increasing LLE pain with onset of numbness 10/07/13 due to recurrent thrombosis left femoral popliteal bypass. Patient has refused chronic coumadin therapy as well as removal of infected L-axillofemoral BG. High L-AKA recommended due to level of demarcation. On 10/08/13, patient underwent L-AKA by Dr. Trula Slade and post op with severe depressive symptoms with lability as well as poor pain control. He was started on Oxycontin for more pain management as well as Toradol. Was treated with IV iron infusion for anemia. Pschiatry consulted for input and patient started on Cymbalta for severe acute depressive symptoms  Left, buttocks remain tender. Problem with sleep Mainly stays on Left side Review of Systems - Negative except Left buttocks pain Review of Systems -  Objective: Vital Signs: Blood pressure 185/66, pulse 107, temperature 97.5 F (36.4 C), temperature source Oral, resp. rate 19, weight 75.137 kg (165 lb 10.4 oz), SpO2 97.00%. No results found. Results for orders placed during the hospital encounter of 10/13/13 (from the past 72 hour(s))  TYPE AND SCREEN     Status: None   Collection Time    10/16/13 10:35 AM      Result Value Ref Range   ABO/RH(D) O POS     Antibody Screen NEG     Sample Expiration 10/19/2013     Unit Number [id number]     Blood Component Type RBC LR PHER1     Unit division 00     Status of Unit ISSUED,FINAL     Transfusion Status OK TO TRANSFUSE     Crossmatch Result Compatible     Unit Number [id number]     Blood Component Type RBC LR PHER1     Unit division 00     Status of Unit ISSUED,FINAL     Transfusion  Status OK TO TRANSFUSE     Crossmatch Result Compatible    PREPARE RBC (CROSSMATCH)     Status: None   Collection Time    10/16/13 10:35 AM      Result Value Ref Range   Order Confirmation ORDER PROCESSED BY BLOOD BANK    GLUCOSE, CAPILLARY     Status: Abnormal   Collection Time    10/16/13 11:35 AM      Result Value Ref Range   Glucose-Capillary 131 (*) 70 - 99 mg/dL   Comment 1 Notify RN    GLUCOSE, CAPILLARY     Status: Abnormal   Collection Time    10/16/13  4:28 PM      Result Value Ref Range   Glucose-Capillary 157 (*) 70 - 99 mg/dL   Comment 1 Notify RN    URINALYSIS, ROUTINE W REFLEX MICROSCOPIC     Status: Abnormal   Collection Time    10/16/13  7:54 PM      Result Value Ref Range   Color, Urine YELLOW  YELLOW   APPearance CLEAR  CLEAR   Specific Gravity, Urine 1.019  1.005 - 1.030   pH 6.0  5.0 - 8.0   Glucose, UA NEGATIVE  NEGATIVE mg/dL   Hgb urine dipstick NEGATIVE  NEGATIVE   Bilirubin Urine NEGATIVE  NEGATIVE   Ketones, ur 15 (*) NEGATIVE mg/dL   Protein, ur NEGATIVE  NEGATIVE mg/dL   Urobilinogen, UA 0.2  0.0 - 1.0 mg/dL   Nitrite NEGATIVE  NEGATIVE   Leukocytes, UA NEGATIVE  NEGATIVE   Comment: MICROSCOPIC NOT DONE ON URINES WITH NEGATIVE PROTEIN, BLOOD, LEUKOCYTES, NITRITE, OR GLUCOSE <1000 mg/dL.  GLUCOSE, CAPILLARY     Status: Abnormal   Collection Time    10/16/13  8:50 PM      Result Value Ref Range   Glucose-Capillary 128 (*) 70 - 99 mg/dL  CBC WITH DIFFERENTIAL     Status: Abnormal   Collection Time    10/17/13  4:25 AM      Result Value Ref Range   WBC 12.0 (*) 4.0 - 10.5 K/uL   RBC 3.80 (*) 4.22 - 5.81 MIL/uL   Hemoglobin 10.0 (*) 13.0 - 17.0 g/dL   Comment: DELTA CHECK NOTED     POST TRANSFUSION SPECIMEN   HCT 31.3 (*) 39.0 - 52.0 %   MCV 82.4  78.0 - 100.0 fL   MCH 26.3  26.0 - 34.0 pg   MCHC 31.9  30.0 - 36.0 g/dL   RDW 21.3 (*) 11.5 - 15.5 %   Platelets 379  150 - 400 K/uL   Neutrophils Relative % 84 (*) 43 - 77 %   Lymphocytes  Relative 9 (*) 12 - 46 %   Monocytes Relative 7  3 - 12 %   Eosinophils Relative 0  0 - 5 %   Basophils Relative 0  0 - 1 %   Neutro Abs 10.1 (*) 1.7 - 7.7 K/uL   Lymphs Abs 1.1  0.7 - 4.0 K/uL   Monocytes Absolute 0.8  0.1 - 1.0 K/uL   Eosinophils Absolute 0.0  0.0 - 0.7 K/uL   Basophils Absolute 0.0  0.0 - 0.1 K/uL   RBC Morphology POLYCHROMASIA PRESENT     Comment: ELLIPTOCYTES   WBC Morphology MILD LEFT SHIFT (1-5% METAS, OCC MYELO, OCC BANDS)    GLUCOSE, CAPILLARY     Status: Abnormal   Collection Time    10/17/13  7:27 AM      Result Value Ref Range   Glucose-Capillary 125 (*) 70 - 99 mg/dL   Comment 1 Notify RN    GLUCOSE, CAPILLARY     Status: Abnormal   Collection Time    10/17/13 11:36 AM      Result Value Ref Range   Glucose-Capillary 134 (*) 70 - 99 mg/dL   Comment 1 Notify RN    GLUCOSE, CAPILLARY     Status: Abnormal   Collection Time    10/17/13  4:21 PM      Result Value Ref Range   Glucose-Capillary 112 (*) 70 - 99 mg/dL   Comment 1 Notify RN    GLUCOSE, CAPILLARY     Status: Abnormal   Collection Time    10/17/13  8:33 PM      Result Value Ref Range   Glucose-Capillary 145 (*) 70 - 99 mg/dL  GLUCOSE, CAPILLARY     Status: Abnormal   Collection Time    10/18/13  7:12 AM      Result Value Ref Range   Glucose-Capillary 128 (*) 70 - 99 mg/dL   Comment 1 Notify RN    GLUCOSE, CAPILLARY     Status: Abnormal   Collection Time    10/18/13 11:27 AM      Result Value Ref Range   Glucose-Capillary 134 (*) 70 - 99 mg/dL   Comment 1 Notify RN  GLUCOSE, CAPILLARY     Status: Abnormal   Collection Time    10/18/13  4:29 PM      Result Value Ref Range   Glucose-Capillary 134 (*) 70 - 99 mg/dL  GLUCOSE, CAPILLARY     Status: Abnormal   Collection Time    10/18/13  9:28 PM      Result Value Ref Range   Glucose-Capillary 149 (*) 70 - 99 mg/dL     HEENT: poor oral hygiene, left facial weakness Cardio: RRR and no  murmur Resp: CTA B/L and unlabored GI: BS  positive and NT, ND Extremity:  Pulses positive and No Edema Skin:  No satellite lesions, Right buttocks dry with mild erythema, left butocks with Grade 2 decub Neuro: Alert/Oriented, Abnormal Motor 2-/5 R delt bi tri grip, 2- L HF, RIght side normal strength, Tone:  Increased Reduced LT sensation R foot Flexor tone LUE and Dysarthric Musc/Skel:  Other left AKA Gen NAD Ext R foot warm, intrinsic muscle atrophy,no lesions  Assessment/Plan: 1. Functional deficits secondary to Left AKA /PAD which require 3+ hours per day of interdisciplinary therapy in a comprehensive inpatient rehab setting. Physiatrist is providing close team supervision and 24 hour management of active medical problems listed below. Physiatrist and rehab team continue to assess barriers to discharge/monitor patient progress toward functional and medical goals. FIM: FIM - Bathing Bathing Steps Patient Completed: Chest;Left Arm;Abdomen;Front perineal area;Right upper leg;Right lower leg (including foot) Bathing: 4: Min-Patient completes 8-9 50f 10 parts or 75+ percent  FIM - Upper Body Dressing/Undressing Upper body dressing/undressing steps patient completed: Thread/unthread right sleeve of pullover shirt/dresss;Thread/unthread left sleeve of pullover shirt/dress;Put head through opening of pull over shirt/dress;Pull shirt over trunk Upper body dressing/undressing: 6: More than reasonable amount of time FIM - Lower Body Dressing/Undressing Lower body dressing/undressing steps patient completed: Thread/unthread right pants leg;Don/Doff right shoe;Fasten/unfasten right shoe;Don/Doff right sock Lower body dressing/undressing: 2: Max-Patient completed 25-49% of tasks  FIM - Toileting Toileting steps completed by patient: Performs perineal hygiene Toileting: 1: Two helpers  FIM - Radio producer Devices: Recruitment consultant Transfers: 3-To toilet/BSC: Mod A (lift or lower assist);3-From  toilet/BSC: Mod A (lift or lower assist)  FIM - Bed/Chair Transfer Bed/Chair Transfer Assistive Devices: Arm rests Bed/Chair Transfer: 3: Bed > Chair or W/C: Mod A (lift or lower assist);3: Chair or W/C > Bed: Mod A (lift or lower assist) (w/c <> mat)  FIM - Locomotion: Wheelchair Distance: 150 Locomotion: Wheelchair: 5: Travels 150 ft or more: maneuvers on rugs and over door sills with supervision, cueing or coaxing FIM - Locomotion: Ambulation Locomotion: Ambulation Assistive Devices: Engineer, agricultural Ambulation/Gait Assistance: 1: +2 Total assist (Mod w/ +2 for chair follow) Locomotion: Ambulation: 1: Two helpers  Comprehension Comprehension Mode: Auditory Comprehension: 5-Understands basic 90% of the time/requires cueing < 10% of the time  Expression Expression Mode: Verbal Expression: 5-Expresses basic 90% of the time/requires cueing < 10% of the time.  Social Interaction Social Interaction: 5-Interacts appropriately 90% of the time - Needs monitoring or encouragement for participation or interaction.  Problem Solving Problem Solving: 5-Solves complex 90% of the time/cues < 10% of the time  Memory Memory: 6-More than reasonable amt of time  Medical Problem List and Plan:  1. 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 DVT Prophylaxis/Anticoagulation: Pharmaceutical: Lovenox  3. Pain Management: On oxycontin 40 mg bid---increase to 50mg  bid. Oxycodone 10 mg prn breakthrough pain.  4. Depressed Mood: continue cymbalta.   -  still a major issue  5. Neuropsych: This patient is capable of making decisions on his own behalf.  6. HTN: Monitor every 8 hours. Lisinopril resumed today.  7. H/o rectal cancer: Has problems with rectal pain when constipated. No results with mag citrated. Will treat with SSE.  8. Chronic constipation: Will increase Senna S to 2 pills bid. Suppository every 24 hours if no BM.  9. 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 Continue  Lantus 5 units and will titrate to home as indicated 10. Iron deficiency anemia: Loaded with IV iron. Continue po supplement. Check orthostatics, stool for OB, consider transfusion if symptomatic 11. H/o CVA with left spastic hemiparesis: Continue aggrenox.  12. Skin: Fungal infection clearing , Right buttocks improving but Left buttocks is not, because of Left hemiplegia, pt tends to lay on Left side, will Ask nsg to position pt on R side Added diflucan, continue local care per WOC recs. Air mattress  -reconsult WOC for further recs  -will work on pain control   LOS (Days) [place] E 10/19/2013, 8:00 AM

## 2013-10-19 NOTE — Progress Notes (Signed)
Occupational Therapy Session Note  Patient Details  Name: Donald [name] MRN: [id number] Date of Birth: 06-22-1945  Today's Date: 10/19/2013 Time: 1443-1540 Time Calculation (min): 61 min  Short Term Goals: Week 1:  OT Short Term Goal 1 (Week 1): Short Term Goals = Long Term Goals due to ELOS  Skilled Therapeutic Interventions/Progress Updates: ADL-retraining with focus on stand-pivot transfers, improved endurance, and adapted bathing/dressing skills.   Patient received in bed, receptive for ADL session.   Patient required extra time to rise from supine to sitting at edge of bed, with mod assist to lift upper body from bed and steadying assist to maintain static balance, initially.  Patient recovered and attempt squat-pivot transfer to his left but was unable to tolerate pressure at residual limb and ceased transfer.   OT educated patient on assisted stand-pivot transfer to w/c placed on his right which patient completed with max assist (pt=60%) without LOB or complaint of pain.  Patient performed seated bathing with overall moderate assist although neglecting attention to his right lower extremity.   Patient remained in w/c at end of session, awaiting physical therapist.    Therapy Documentation Precautions:  Precautions Precautions: Fall Precaution Comments: LT AKA wrap, pt is anxious with mobility, history of Left hemiparesis and increased tone. Restrictions Weight Bearing Restrictions: No  Vital Signs: Therapy Vitals Temp: 97.4 F (36.3 C) Temp src: Oral Pulse Rate: 54 Resp: 18 BP: 134/72 mmHg Patient Position (if appropriate): Sitting Oxygen Therapy SpO2: 96 % O2 Device: Nasal cannula O2 Flow Rate (L/min): 3 L/min Pulse Oximetry Type: Intermittent  Pain: Pain Assessment Pain Assessment: 0-10 Pain Type: Acute pain Pain Location: Buttocks Pain Intervention(s): RN made aware;Medication (See eMAR)  ADL: ADL ADL Comments: see FIM  See FIM for current functional  status  Therapy/Group: Individual Therapy  Second session: Time: 1415-1430 Time Calculation (min): 15 min Missed Time: 15 min, patient refused d/t fatigue  Pain Assessment: 3/10, buttocks  Skilled Therapeutic Interventions: Patient received supine in bed asleep with wife present in room and all lights dimmed.   Per wife, patient did not eat lunch or take fluids as advised.   OT facilitated increased stimulation to improve arousal and engagement to which patient responded briefly but remained supine complaining of discomfort and fatigue.   HR and 02 sats assessed (68 bpm, 96%),  Right radial artery pulse checked noted for mild arrythmia; RN notified immediately.  Patient refused further treatment due to fatigue, although alert and oriented to place, person and situation.  See FIM for current functional status  Therapy/Group: Individual Therapy  [place] 10/19/2013, 2:54 PM

## 2013-10-19 NOTE — Consult Note (Signed)
WOC wound follow up Wound type: MASD with fungal overgrowth. Blanchable erythema that extends over both buttocks and into the gluteal fold. He has one area of partial thickness skin loss on the left buttock that is scabbed over.  At the apex of the gluteal skin folds there are 3 open areas (partial thickness) that the wife reports started at home.  Feel that they may have a pressure component as well.   Wound bed: see above Drainage (amount, consistency, odor) none Periwound:some superficial skin peeling  Dressing procedure/placement/frequency: Will add Gerhardt's butt cream for the wounds at the apex of the gluteal fold and the other partial thickness skin opening.  Pt using LALM mattress for moisture control and turning from side to side to avoid pressure on this area.  Pt did receive Diflucan PO as per my partners recommendation.  Does not appear that this area is improving. No involvement of the bilateral groin.  Will provide antifungal ointment for the lower buttocks for use and re-evaluate patient in a few days.   WOC will follow along with you for reevaluation of affected areas [place], [place]

## 2013-10-19 NOTE — Progress Notes (Addendum)
Physical Therapy Note  Patient Details  Name: Donald [name] MRN: [id number] Date of Birth: 1946-01-19 Today's Date: 10/19/2013  Pt missed 60 mins of skilled PT time this afternoon.  Note pt to be extremely lethargic with irregular heart beat.  Note per wife that [place], [place] had noted same thing and had notified RN to notify PA about possible need for IV fluids.  Pt aroused to voice, however could not maintain arousal enough to get out of bed or participate in bed level activity.  Will continue to check back with pt.   Denice Bors 10/19/2013, 3:34 PM

## 2013-10-19 NOTE — Progress Notes (Signed)
Skin assessment findings specifically buttock wounds and ordered treatment with microguard discussed with Pam Panchikal-Love  PAC. Requested additional treatment modalities specifically santyl for three areas of slough and Gerhardt's butt cream for the buttocks. Also requested WOC consult order be placed. Pam noted she needed to see pt prior to new orders. Noted pt was currently up on side of bed with therapy could see him now. After PA's assessment no new orders received; wants to "wait weekend to see improvement with microguard use and will add additional orders if warranted on Monday". Explained treatment of skin to both pt and wife. Margarito Liner

## 2013-10-19 NOTE — Progress Notes (Signed)
Therapist, Pilar Plate, reports pt's irregular pulse, lethargic. Pulse 54. CBG stable. Silvestre Mesi, PA notified. No new orders received at this time. Will continue to encourage pt's participation in therapy as well as increased po intake.

## 2013-10-19 NOTE — Progress Notes (Signed)
Physical Therapy Session Note  Patient Details  Name: Donald [name] MRN: [id number] Date of Birth: [birth year]  Today's Date: 10/19/2013 Time: 1030-1108 Time Calculation (min): 38 min  Short Term Goals: Week 1:  PT Short Term Goal 1 (Week 1): Pt will perform bed mobility with S with HOB flat and without bed rails PT Short Term Goal 2 (Week 1): Pt will perform dynamic standing x 3 mins at min A level with RUE supported PT Short Term Goal 3 (Week 1): Pt will self propel w/c x 150' using R hemi technique at S level PT Short Term Goal 4 (Week 1): Pt will verbalize pressure relief techniques with min cues  Skilled Therapeutic Interventions/Progress Updates:   Pt received sitting in recliner in room, having just finished OT session.  Pt agreeable to PT session, however with 10/10 pain in buttocks.  Note he has received pain meds this morning, and RN to give pt again when time.  Assisted pt to/from therapy gym in recliner (more comfortable than w/c) in order to work on standing and gait with L PFRW.  Once in therapy gym, performed two reps of standing and single attempt at forward hopping x 4' at [facility] A with close chair follow for safety.  Pt with increasing depressed mood during session, stating, "I just can't do this anymore" and "You just don't know what it feels like."  Provided max encouragement to progress pt has already made during therapy, esp on Saturday when he made 3 reps of hopping.  Pt agreed, however still requires max verbal encouragement to continue with participation.  Pt unwilling to continue with standing, therefore assisted back to room and transferred back to bed from recliner via stand pivot at mod A level.  Pt left in R SL position to provide relief from pressure on L buttock region.  Wife in room and observed transfer back to bed.  Pt left with all needs in reach.   Therapy Documentation Precautions:  Precautions Precautions: Fall Precaution Comments: LT AKA wrap, pt is  anxious with mobility, history of Left hemiparesis and increased tone. Restrictions Weight Bearing Restrictions: No General: Amount of Missed PT Time (min): 7 Minutes Missed Time Reason: Patient fatigue   Pain: Pain Assessment Pain Assessment: 10/10, RN made aware Pain Type: Acute pain Pain Location: Buttocks Pain Intervention(s): RN made aware;Medication (See eMAR)  See FIM for current functional status  Therapy/Group: Individual Therapy  Denice Bors 10/19/2013, 11:10 AM

## 2013-10-20 ENCOUNTER — Inpatient Hospital Stay (HOSPITAL_COMMUNITY): Payer: Self-pay | Admitting: Rehabilitation

## 2013-10-20 ENCOUNTER — Inpatient Hospital Stay (HOSPITAL_COMMUNITY): Payer: Self-pay

## 2013-10-20 ENCOUNTER — Inpatient Hospital Stay (HOSPITAL_COMMUNITY): Payer: Medicare Other | Admitting: Rehabilitation

## 2013-10-20 ENCOUNTER — Encounter (HOSPITAL_COMMUNITY): Payer: Self-pay

## 2013-10-20 DIAGNOSIS — I739 Peripheral vascular disease, unspecified: Secondary | ICD-10-CM

## 2013-10-20 DIAGNOSIS — L98499 Non-pressure chronic ulcer of skin of other sites with unspecified severity: Secondary | ICD-10-CM

## 2013-10-20 DIAGNOSIS — E119 Type 2 diabetes mellitus without complications: Secondary | ICD-10-CM

## 2013-10-20 DIAGNOSIS — S88119A Complete traumatic amputation at level between knee and ankle, unspecified lower leg, initial encounter: Secondary | ICD-10-CM

## 2013-10-20 DIAGNOSIS — G8110 Spastic hemiplegia affecting unspecified side: Secondary | ICD-10-CM

## 2013-10-20 LAB — BASIC METABOLIC PANEL
BUN: 19 mg/dL (ref 6–23)
CALCIUM: 8.9 mg/dL (ref 8.4–10.5)
CHLORIDE: 98 meq/L (ref 96–112)
CO2: 26 meq/L (ref 19–32)
Creatinine, Ser: 0.82 mg/dL (ref 0.50–1.35)
GFR calc Af Amer: >90 mL/min (ref >90)
GFR calc non Af Amer: 88 mL/min — ABNORMAL LOW (ref >90)
Glucose, Bld: 144 mg/dL — ABNORMAL HIGH (ref 70–99)
Potassium: 4.3 mEq/L (ref 3.7–5.3)
Sodium: 139 mEq/L (ref 137–147)

## 2013-10-20 LAB — GLUCOSE, CAPILLARY
GLUCOSE-CAPILLARY: 167 mg/dL — AB (ref 70–99)
GLUCOSE-CAPILLARY: 198 mg/dL — AB (ref 70–99)
Glucose-Capillary: 156 mg/dL — ABNORMAL HIGH (ref 70–99)
Glucose-Capillary: 142 mg/dL — ABNORMAL HIGH (ref 70–99)

## 2013-10-20 LAB — OCCULT BLOOD X 1 CARD TO LAB, STOOL: FECAL OCCULT BLD: NEGATIVE

## 2013-10-20 MED ORDER — OXYCODONE HCL ER 15 MG PO T12A: 40.0000 mg | EXTENDED_RELEASE_TABLET | Freq: Two times a day (BID) | ORAL | Status: DC

## 2013-10-20 MED ORDER — OXYCODONE HCL ER 15 MG PO T12A
40.0000 mg | EXTENDED_RELEASE_TABLET | Freq: Two times a day (BID) | ORAL | Status: DC
  Administered 2013-10-20 – 2013-10-28 (×18): 40 mg via ORAL
  Filled 2013-10-20 (×3): qty 1
  Filled 2013-10-20 (×3): qty 2
  Filled 2013-10-20: qty 1
  Filled 2013-10-20: qty 2
  Filled 2013-10-20: qty 1
  Filled 2013-10-20: qty 2
  Filled 2013-10-20 (×2): qty 1
  Filled 2013-10-20 (×3): qty 2
  Filled 2013-10-20 (×3): qty 1
  Filled 2013-10-20: qty 2
  Filled 2013-10-20: qty 1
  Filled 2013-10-20: qty 2
  Filled 2013-10-20 (×2): qty 1
  Filled 2013-10-20: qty 2
  Filled 2013-10-20: qty 1
  Filled 2013-10-20: qty 2
  Filled 2013-10-20: qty 1
  Filled 2013-10-20: qty 2
  Filled 2013-10-20 (×2): qty 1
  Filled 2013-10-20 (×2): qty 2
  Filled 2013-10-20 (×2): qty 1
  Filled 2013-10-20 (×3): qty 2

## 2013-10-20 MED ORDER — BISACODYL 10 MG RE SUPP
10.0000 mg | Freq: Once | RECTAL | Status: AC
  Administered 2013-10-20: 10 mg via RECTAL
  Filled 2013-10-20: qty 1

## 2013-10-20 NOTE — Progress Notes (Signed)
Subjective/Complaints: 68 y.o. right-handed male with history of CVA with left-sided weakness, HTN, PVD with multiple revascularization procedures as well his recent thrombectomy of occluded FPBG with 4 compartment fasciotomy requiring VAC 5/ 2015. He was d/c from CIR on 09/24/13 and was ambulating at supervision level with RW. He started developing increasing LLE pain with onset of numbness 10/07/13 due to recurrent thrombosis left femoral popliteal bypass. Patient has refused chronic coumadin therapy as well as removal of infected L-axillofemoral BG. High L-AKA recommended due to level of demarcation. On 10/08/13, patient underwent L-AKA by Dr. Trula Slade and post op with severe depressive symptoms with lability as well as poor pain control. He was started on Oxycontin for more pain management as well as Toradol. Was treated with IV iron infusion for anemia. Pschiatry consulted for input and patient started on Cymbalta for severe acute depressive symptoms  Slept better per pt but more lethargic this am Mainly stays on Left side Review of Systems - Negative except Left buttocks pain Review of Systems -  Objective: Vital Signs: Blood pressure 123/62, pulse 91, temperature 97.6 F (36.4 C), temperature source Oral, resp. rate 18, weight 75.137 kg (165 lb 10.4 oz), SpO2 99.00%. No results found. Results for orders placed during the hospital encounter of 10/13/13 (from the past 72 hour(s))  GLUCOSE, CAPILLARY     Status: Abnormal   Collection Time    10/17/13  7:27 AM      Result Value Ref Range   Glucose-Capillary 125 (*) 70 - 99 mg/dL   Comment 1 Notify RN    GLUCOSE, CAPILLARY     Status: Abnormal   Collection Time    10/17/13 11:36 AM      Result Value Ref Range   Glucose-Capillary 134 (*) 70 - 99 mg/dL   Comment 1 Notify RN    GLUCOSE, CAPILLARY     Status: Abnormal   Collection Time    10/17/13  4:21 PM      Result Value Ref Range   Glucose-Capillary 112 (*) 70 - 99 mg/dL   Comment 1  Notify RN    GLUCOSE, CAPILLARY     Status: Abnormal   Collection Time    10/17/13  8:33 PM      Result Value Ref Range   Glucose-Capillary 145 (*) 70 - 99 mg/dL  GLUCOSE, CAPILLARY     Status: Abnormal   Collection Time    10/18/13  7:12 AM      Result Value Ref Range   Glucose-Capillary 128 (*) 70 - 99 mg/dL   Comment 1 Notify RN    GLUCOSE, CAPILLARY     Status: Abnormal   Collection Time    10/18/13 11:27 AM      Result Value Ref Range   Glucose-Capillary 134 (*) 70 - 99 mg/dL   Comment 1 Notify RN    GLUCOSE, CAPILLARY     Status: Abnormal   Collection Time    10/18/13  4:29 PM      Result Value Ref Range   Glucose-Capillary 134 (*) 70 - 99 mg/dL  GLUCOSE, CAPILLARY     Status: Abnormal   Collection Time    10/18/13  9:28 PM      Result Value Ref Range   Glucose-Capillary 149 (*) 70 - 99 mg/dL  GLUCOSE, CAPILLARY     Status: Abnormal   Collection Time    10/19/13  8:20 AM      Result Value Ref Range   Glucose-Capillary 134 (*)  70 - 99 mg/dL   Comment 1 Notify RN    GLUCOSE, CAPILLARY     Status: Abnormal   Collection Time    10/19/13 11:33 AM      Result Value Ref Range   Glucose-Capillary 187 (*) 70 - 99 mg/dL   Comment 1 Notify RN    GLUCOSE, CAPILLARY     Status: Abnormal   Collection Time    10/19/13  2:31 PM      Result Value Ref Range   Glucose-Capillary 144 (*) 70 - 99 mg/dL   Comment 1 Notify RN    GLUCOSE, CAPILLARY     Status: Abnormal   Collection Time    10/19/13  4:19 PM      Result Value Ref Range   Glucose-Capillary 130 (*) 70 - 99 mg/dL   Comment 1 Notify RN    GLUCOSE, CAPILLARY     Status: Abnormal   Collection Time    10/19/13  9:16 PM      Result Value Ref Range   Glucose-Capillary 199 (*) 70 - 99 mg/dL   Comment 1 Notify RN    BASIC METABOLIC PANEL     Status: Abnormal   Collection Time    10/20/13  5:14 AM      Result Value Ref Range   Sodium 139  137 - 147 mEq/L   Potassium 4.3  3.7 - 5.3 mEq/L   Chloride 98  96 - 112 mEq/L    CO2 26  19 - 32 mEq/L   Glucose, Bld 144 (*) 70 - 99 mg/dL   BUN 19  6 - 23 mg/dL   Creatinine, Ser 0.82  0.50 - 1.35 mg/dL   Calcium 8.9  8.4 - 10.5 mg/dL   GFR calc non Af Amer 88 (*) >90 mL/min   GFR calc Af Amer >90  >90 mL/min   Comment: (NOTE)     The eGFR has been calculated using the CKD EPI equation.     This calculation has not been validated in all clinical situations.     eGFR's persistently <90 mL/min signify possible Chronic Kidney     Disease.  GLUCOSE, CAPILLARY     Status: Abnormal   Collection Time    10/20/13  7:14 AM      Result Value Ref Range   Glucose-Capillary 156 (*) 70 - 99 mg/dL   Comment 1 Notify RN       HEENT: poor oral hygiene, left facial weakness Cardio: RRR and no  murmur Resp: CTA B/L and unlabored GI: BS positive and NT, ND Extremity:  Pulses positive and No Edema Skin:  No satellite lesions, Right buttocks dry with mild erythema, left butocks with Grade 2 decub Neuro: Alert/Oriented, Abnormal Motor 2-/5 R delt bi tri grip, 2- L HF, RIght side normal strength, Tone:  Increased Reduced LT sensation R foot Flexor tone LUE and Dysarthric Musc/Skel:  Other left AKA Gen NAD Ext R foot warm, intrinsic muscle atrophy,no lesions  Assessment/Plan: 1. Functional deficits secondary to Left AKA /PAD which require 3+ hours per day of interdisciplinary therapy in a comprehensive inpatient rehab setting. Physiatrist is providing close team supervision and 24 hour management of active medical problems listed below. Physiatrist and rehab team continue to assess barriers to discharge/monitor patient progress toward functional and medical goals. FIM: FIM - Bathing Bathing Steps Patient Completed: Chest;Left Arm;Abdomen;Front perineal area;Right upper leg;Right lower leg (including foot) Bathing: 3: Mod-Patient completes 5-7 [id number] parts or 50-74%  FIM - Upper Body Dressing/Undressing Upper body dressing/undressing steps patient completed: Thread/unthread  right sleeve of pullover shirt/dresss;Thread/unthread left sleeve of pullover shirt/dress;Put head through opening of pull over shirt/dress;Pull shirt over trunk Upper body dressing/undressing: 0: Wears gown/pajamas-no public clothing FIM - Lower Body Dressing/Undressing Lower body dressing/undressing steps patient completed: Thread/unthread right pants leg;Don/Doff right shoe;Fasten/unfasten right shoe;Don/Doff right sock Lower body dressing/undressing: 0: Wears gown/pajamas-no public clothing  FIM - Toileting Toileting steps completed by patient: Performs perineal hygiene Toileting: 1: Two helpers  FIM - Radio producer Devices: Recruitment consultant Transfers: 3-To toilet/BSC: Mod A (lift or lower assist);3-From toilet/BSC: Mod A (lift or lower assist)  FIM - Bed/Chair Transfer Bed/Chair Transfer Assistive Devices: Bed rails;Arm rests;HOB elevated Bed/Chair Transfer: 3: Supine > Sit: Mod A (lifting assist/Pt. 50-74%/lift 2 legs;3: Bed > Chair or W/C: Mod A (lift or lower assist)  FIM - Locomotion: Wheelchair Distance: 150 Locomotion: Wheelchair: 5: Travels 150 ft or more: maneuvers on rugs and over door sills with supervision, cueing or coaxing FIM - Locomotion: Ambulation Locomotion: Ambulation Assistive Devices: Engineer, agricultural Ambulation/Gait Assistance: 1: +2 Total assist (Mod w/ +2 for chair follow) Locomotion: Ambulation: 1: Two helpers  Comprehension Comprehension Mode: Auditory Comprehension: 5-Follows basic conversation/direction: With extra time/assistive device  Expression Expression Mode: Verbal Expression: 5-Expresses basic needs/ideas: With no assist  Social Interaction Social Interaction: 5-Interacts appropriately 90% of the time - Needs monitoring or encouragement for participation or interaction.  Problem Solving Problem Solving: 4-Solves basic 75 - 89% of the time/requires cueing 10 - 24% of the time  Memory Memory: 6-More  than reasonable amt of time  Medical Problem List and Plan:  1. 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 DVT Prophylaxis/Anticoagulation: Pharmaceutical: Lovenox  3. Pain Management: On oxycontin  22m bid, reduce to 419mdue to lethargy. Oxycodone 10 mg prn breakthrough pain.  4. Depressed Mood: continue cymbalta.   -still a major issue  5. Neuropsych: This patient is capable of making decisions on his own behalf.  6. HTN: Monitor every 8 hours. Lisinopril resumed today.  7. H/o rectal cancer: Has problems with rectal pain when constipated. Last BM 6/20, adjust meds 8. Chronic constipation: Will increase Senna S to 2 pills bid. Suppository every 24 hours if no BM.  9. 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 Continue Lantus 5 units and will titrate to home as indicated 10. Iron deficiency anemia: Loaded with IV iron. Continue po supplement. Check orthostatics, stool for OB, consider transfusion if symptomatic 11. H/o CVA with left spastic hemiparesis: Continue aggrenox.  12. Skin: Fungal infection clearing , Right buttocks improving but Left buttocks is not, because of Left hemiplegia, pt tends to lay on Left side, will Ask nsg to position pt on R side Added diflucan, continue local care per WOC recs. Air mattress  -reconsult WOC for further recs  -will work on pain control   LOS (Days) 7 [place] 10/20/2013, 7:18 AM

## 2013-10-20 NOTE — Progress Notes (Addendum)
Physical Therapy Session Note  Patient Details  Name: Donald [name] MRN: [id number] Date of Birth: Oct 02, 1945  Today's Date: 10/20/2013 Time: 1117-1200 Time Calculation (min): 43 min  Short Term Goals: Week 1:  PT Short Term Goal 1 (Week 1): Pt will perform bed mobility with S with HOB flat and without bed rails PT Short Term Goal 2 (Week 1): Pt will perform dynamic standing x 3 mins at min A level with RUE supported PT Short Term Goal 3 (Week 1): Pt will self propel w/c x 150' using R hemi technique at S level PT Short Term Goal 4 (Week 1): Pt will verbalize pressure relief techniques with min cues  Skilled Therapeutic Interventions/Progress Updates:   Pt received lying in bed this morning, wife present in room, however did not attend session and left room in efforts to "benefit pt."  Performed bed mobility with min A level with use of bedrails.  Tolerated getting to EOB better, despite c/o pain with other movements.  Transferred to w/c stand pivot transfer at mod A.  Note pt states he did well during OT session without supplemental O2, therefore did whole session throughout and pt maintained in low 90's.  Skilled session focused on standing balance activity while performing reaching activity.   Pt able to maintain standing x 2-3 mins with max A during reaching.  Note during first two reps increased posterior pushing/leaning with cues for increased knee flex and glute activation.  Better trunk control noted on third and fourth rep, however pt unable to stand as long and began stating  "I can't do this" and "I'm not doing as well as I should be."  Continue to provide max encouragement for pt during sessions and provide verbal evidence of his progress in therapies.  Assisted pt back to room and transferred back to recliner with pillow under L hip area to decrease pressure on L buttocks.  Pt left with all needs in reach.   Therapy Documentation Precautions:  Precautions Precautions:  Fall Precaution Comments: LT AKA wrap, pt is anxious with mobility, history of Left hemiparesis and increased tone. Restrictions Weight Bearing Restrictions: No   Pain: Pain Assessment Pain Assessment: 0-10 Pain Score: 6  Faces Pain Scale: Hurts even more Pain Type: Acute pain Pain Location: Buttocks Pain Orientation: Left;Right Pain Descriptors / Indicators: Aching Pain Onset: On-going Patients Stated Pain Goal: 4 Pain Intervention(s): Medication (See eMAR)  See FIM for current functional status  Therapy/Group: Individual Therapy  Denice Bors 10/20/2013, 12:24 PM

## 2013-10-20 NOTE — Consult Note (Signed)
INITIAL DIAGNOSTIC EVALUATION - [facility]   MEDICAL NECESSITY:  Sarim Rothman was seen on the [facility] for an initial diagnostic evaluation owing to the patient's diagnosis of stroke.   According to medical records, Mr. Spofford was admitted to the rehab unit owing to "Functional deficits secondary to L-AKA due to PAD." He reportedly has a history of CVA with left-sided weakness, HTN, PVD with multiple revascularization procedures as well his recent thrombectomy of occluded FPBG with 4 compartment fasciotomy requiring VAC 5/ 2015. On 10/08/13, he underwent L-AKA and was severely depressed post-operatively.   During today's visit, Mr. Kuck was accompanied by his wife Bethena Roys). She helped with the history. Patient complained of memory loss and disrupted attention post-surgery. Mood-wise, he endorsed severe depressive symptoms including anhedonia, poor appetite, hopelessness, loss of control, loss of function, and negative view of the world, self, and future. He described himself as being in "despair." He has no history of mental health treatment and says he has never suffered from depression or anxiety in the past. He has now been prescribed an antidepressant that he does not find to be helpful. He admits to passive suicidal ideation, but has no plan or intent to ever commit suicide. No manic or hypomanic episodes were reported. The patient denied ever experiencing any auditory/visual hallucinations. No major behavioral or personality changes were endorsed.   Mr. Lebeda reportedly feels that people are not listening to him, though he described the rehab staff as "excellent." He is not sure whether he is making gains in therapy. He often feels overwhelmed and believes that he is not living up to his therapist's expectations. His wife is a great source of support and visits him often.   PROCEDURES ADMINISTERED: [1 unit [id number] on  10/19/13] Diagnostic clinical interview  Review of available records  Behavioral Evaluation: Mr. Tinkey was appropriately dressed for season and situation, and he appeared tidy and well-groomed. Normal posture was noted. He was generally friendly and rapport was adequately established. His speech was as expected and he was able to express ideas effectively. His affect was blunted and he appeared depressed. Attention and motivation were adequate.    Overall, Mr. Harm is suffering from a severe degree of depressive symptoms post-surgery. He has having a hard time adjusting to his stay. A great deal of time was spent normalizing his feelings. We also spent a good deal of time brainstorming more reasonable expectations. Though he reported experiencing certain cognitive difficulties, our main priority at this time is mood control. Plus, staff has not indicated that disrupted cognition has been a barrier to therapy. As such, neuropsychology will plan to routinely follow-up with the patient for coping and supportive psychotherapy. It would behoove other rehab staff to also be involved. These recommendations are discussed below.    RECOMMENDATIONS  Recommendations for treatment team:    Neuropsychology will follow-up with the patient. Please put him Dr. Georges Mouse schedule for this Thursday.    Patient would benefit from being involved in as many decisions regarding his care as possible. This will help combat the feeling that he has no control.    All of his therapists would help him greatly to really keep him in the loop regarding his recovery. In addition, he often feels overwhelmed and believes that he is not living up to his therapist's expectations. As such, keeping him constantly informed will hopefully help him start to realize that he is making gains and assist him with starting  to focus more on the positive.    Continued follow-up regarding his antidepressant. Consider increasing dose.    Complete neuropsychological evaluation as an outpatient.     DIAGNOSES:  Cerebral infarction Depression (severe)   Rutha Bouchard, Psy.D.  Clinical Neuropsychologist

## 2013-10-20 NOTE — Progress Notes (Signed)
Physical Therapy Session Note  Patient Details  Name: Donald [name] MRN: [id number] Date of Birth: 11/20/1945  Today's Date: 10/20/2013 Time: 1117-1200 Time Calculation (min): 43 min  Short Term Goals: Week 1:  PT Short Term Goal 1 (Week 1): Pt will perform bed mobility with S with HOB flat and without bed rails PT Short Term Goal 2 (Week 1): Pt will perform dynamic standing x 3 mins at min A level with RUE supported PT Short Term Goal 3 (Week 1): Pt will self propel w/c x 150' using R hemi technique at S level PT Short Term Goal 4 (Week 1): Pt will verbalize pressure relief techniques with min cues  Skilled Therapeutic Interventions/Progress Updates:   Pt received sitting in recliner in room, agreeable to therapy this afternoon.  Co-treat with RT during first 30 mins in order to work on functional transfers in solarium to varying levels of furniture. Performed 3 stand/squat pivot transfers w/c <> other surfaces (and from recliner) at mod A.  Note pt did better on last two reps with decreased amount of assist from therapist and better carryover with correct hand placement.  Discussed where he sits normally at home, need to continue to transfer and move when at home to prevent further breakdown of skin, and appropriateness of lift chair.  Assisted pt back to unit and performed 3 mins seated nustep at level 2 resistance with RLE and BUEs (LUE splint to assist with placement).  Pt with increase fatigued, therefore transferred back to w/c and assisted back to room.  Transferred back to bed as stated above.  Left in bed with all needs in reach and turned slightly towards L side as he has been off of L most of day.    Therapy Documentation Precautions:  Precautions Precautions: Fall Precaution Comments: LT AKA wrap, pt is anxious with mobility, history of Left hemiparesis and increased tone. Restrictions Weight Bearing Restrictions: No   Vital Signs: Therapy Vitals Temp: 97.4 F (36.3  C) Temp src: Oral Pulse Rate: 94 Resp: 18 BP: 119/72 mmHg Patient Position (if appropriate): Sitting Oxygen Therapy SpO2: 91 % O2 Device: None (Room air) Pain: Pain Assessment Pain Assessment: 0-10 Pain Score: 5  Pain Location: Buttocks Pain Descriptors / Indicators: Aching Pain Onset: On-going Patients Stated Pain Goal: 4 Pain Intervention(s): Medication (See eMAR)  See FIM for current functional status  Therapy/Group: Individual Therapy  Denice Bors 10/20/2013, 3:41 PM

## 2013-10-20 NOTE — Progress Notes (Signed)
Occupational Therapy Session Note  Patient Details  Name: Donald [name] MRN: [id number] Date of Birth: 12/22/1945  Today's Date: 10/20/2013  Session 1 Time: 0800-0845 Time Calculation (min): 45 min  Short Term Goals: Week 1:  OT Short Term Goal 1 (Week 1): Short Term Goals = Long Term Goals due to ELOS  Skilled Therapeutic Interventions/Progress Updates:    Pt asleep in bed upon arrival but easily aroused with tactile cue.  Pt agreeable to bathing and dressing w/c level at sink.  Pt required max A and max verbal cues for bed mobility and stand pivot transfer to w/c.  Pt unable to stand erect when standing at sink and continued to lean forward on sink.  Pt performed sit<>stand at sink with max A.  Pt required extra time to complete tasks with multiple rest breaks.  O2 sats >90% on 2L O2 throughout session.  Pt remained in w/c with all needs within reach.  Pt declined eating breakfast stating that he just wasn't hungry.  Focus on bed mobility, activity tolerance, sit<>stand, transfers, and safety awareness.  Therapy Documentation Precautions:  Precautions Precautions: Fall Precaution Comments: LT AKA wrap, pt is anxious with mobility, history of Left hemiparesis and increased tone. Restrictions Weight Bearing Restrictions: No Pain: Pain Assessment Pain Assessment: Faces Faces Pain Scale: Hurts even more Pain Type: Acute pain Pain Location: Buttocks Pain Orientation: Right;Left Pain Descriptors / Indicators: Sore Pain Onset: On-going Pain Intervention(s): RN made aware;Repositioned  See FIM for current functional status  Therapy/Group: Individual Therapy  Session 2 Pt missed 30 mins skilled OT services. Pt had returned to bed after earlier session secondary to fatigue and increased pain in buttocks.  Pt stated that he couldn't do anything and just needed to rest for therapy later in the morning.  Encouragement offered but patient continued to decline.  Leotis Shames  [facility] 10/20/2013, 8:51 AM

## 2013-10-21 ENCOUNTER — Inpatient Hospital Stay (HOSPITAL_COMMUNITY): Payer: Self-pay | Admitting: Rehabilitation

## 2013-10-21 ENCOUNTER — Encounter (HOSPITAL_COMMUNITY): Payer: Self-pay | Admitting: Occupational Therapy

## 2013-10-21 ENCOUNTER — Inpatient Hospital Stay (HOSPITAL_COMMUNITY): Payer: Self-pay | Admitting: Occupational Therapy

## 2013-10-21 ENCOUNTER — Ambulatory Visit: Payer: Medicare Other | Admitting: Vascular Surgery

## 2013-10-21 ENCOUNTER — Inpatient Hospital Stay (HOSPITAL_COMMUNITY): Payer: Self-pay

## 2013-10-21 LAB — GLUCOSE, CAPILLARY
GLUCOSE-CAPILLARY: 175 mg/dL — AB (ref 70–99)
Glucose-Capillary: 170 mg/dL — ABNORMAL HIGH (ref 70–99)
Glucose-Capillary: 168 mg/dL — ABNORMAL HIGH (ref 70–99)
Glucose-Capillary: 176 mg/dL — ABNORMAL HIGH (ref 70–99)

## 2013-10-21 MED ORDER — OXYCODONE HCL 5 MG PO TABS
15.0000 mg | ORAL_TABLET | ORAL | Status: DC | PRN
  Administered 2013-10-21 – 2013-10-29 (×20): 15 mg via ORAL
  Filled 2013-10-21 (×19): qty 3

## 2013-10-21 NOTE — Progress Notes (Addendum)
Occupational Therapy Session Note  Patient Details  Name: TREVAUGHN SCHEAR MRN: [id number] Date of Birth: 1946/04/02  Today's Date: 10/21/2013 Time: 0935-1020 Time Calculation (min): 45 min  Second Session: Time 14:31-15:16 Time Calculation (min): 45 mins   Short Term Goals: Week 1:  OT Short Term Goal 1 (Week 1): Short Term Goals = Long Term Goals due to ELOS  Skilled Therapeutic Interventions/Progress Updates:    Session 1: Pt transferred from bed to wheelchair with mod assist for bathing and dressing at the sink.  He still exhibits a down mood throughout session and is really limited secondary to increased pain in his buttocks.  He is able to stand at the sink with mod assist but needs max facilitation for washing his buttocks and pulling pants or shorts over his bottom.  Total assist for integration of the LUE for washing the RUE secondary to increased flexor tone in the biceps, finger flexors, and internal rotators.  He needed max assist for donning shorts over his RLE and pulling them over his hips.  Pt only able to maintain standing for 20 second intervals secondary to the RLE becoming painful.    Session 2: Pt transferred from wheelchair to the therapy mat with mod assist.  Pt with increased lean to the right in sitting with pt stating that he feels tense.  Applied NMES to the LUE to help reduce increased flexor tone in the digits as he maintains digit flexion at rest.  In sitting, pt demonstrates increased lean to the right, but feel it is likely due to increased pain in his buttocks.  Pt was positioned in right sidelying to work on scapular mobilizations to help increase trunk alignment and reduce tone in the arm while NMES was activated to digit extensors.  Settings of NMES on large muscle pre-existing program with intensity set a 48.  Pt able to tolerate NMES without difficulty.  Transferred back to chair at end of session with mod assist squat pivot.   Therapy  Documentation Precautions:  Precautions Precautions: Fall Precaution Comments: LT AKA wrap, pt is anxious with mobility, history of Left hemiparesis and increased tone. Restrictions Weight Bearing Restrictions: No  Pain: Pain Assessment Pain Assessment: Faces Pain Score: Asleep Faces Pain Scale: Hurts even more Pain Type: Acute pain Pain Location: Buttocks Pain Onset: With Activity Pain Intervention(s): Medication (See eMAR);Repositioned ADL: ADL ADL Comments: see FIM  See FIM for current functional status  Therapy/Group: Individual Therapy  MCGUIRE,JAMES OTR/L 10/21/2013, 4:00 PM

## 2013-10-21 NOTE — Progress Notes (Signed)
Physical Therapy Session Note  Patient Details  Name: Donald [name] MRN: [id number] Date of Birth: 1946/03/08  Today's Date: 10/21/2013 Time: 1120-1200 Time Calculation (min): 40 min  Skilled Therapeutic Interventions/Progress Updates:  1:1. Pt received sitting in w/c, ready for therapy reporting high level of pain on bottom due to prolonged sitting. Focus this session on t/f sit<>stand and positioning in w/c. Pt practiced multiple t/f sit<>stand in // bars w/ min-mod A, req manual facilitation for improved trunk/hip extension. Pt demonstrating w/ significant R lean and unable to position self in improved midline position due to dependence on R UE. Emphasized bottom clearance w/ scooting forward/backwards in chair to prevent further skin breakdown. Pt able to propel w/c 75'x1 w/ R UE/LE and supervision. Pt req consistent encouragement throughout session due to self-doubt, constantly stating "I'm just wasting your time." Pt assisted back to bed at end of session for pressure relief on bottom, all needs in reach and wife in room.   Therapy Documentation Precautions:  Precautions Precautions: Fall Precaution Comments: LT AKA wrap, pt is anxious with mobility, history of Left hemiparesis and increased tone. Restrictions Weight Bearing Restrictions: No  See FIM for current functional status  Therapy/Group: Individual Therapy  Gilmore Laroche 10/21/2013, 12:26 PM

## 2013-10-21 NOTE — Plan of Care (Signed)
Problem: RH PAIN MANAGEMENT Goal: RH STG PAIN MANAGED AT OR BELOW PT'S PAIN GOAL less than 6  Outcome: Not Progressing Patient complaining of severe pain to buttock area. adm

## 2013-10-21 NOTE — Progress Notes (Signed)
Physical Therapy Weekly Progress Note  Patient Details  Name: Donald [name] MRN: [id number] Date of Birth: 07-03-1945  Beginning of progress report period: October 14, 2013 End of progress report period: October 21, 2013  Today's Date: 10/21/2013 Time: 1303-1400 Time Calculation (min): 57 min  Patient has met 0 of 4 short term goals.  Pt making very slow progress with therapy and is severely limited by depressed mood, increased done and deficits from previous CVA, decreased endurance, and decreased strength in RLE.  Also continues to have increased pain in buttocks, limiting therapy as well.  Feel that wife may not be able to provide adequate amount of care that pt will need at time of D/C and may benefit from SNF.   Patient continues to demonstrate the following deficits: decreased overall strength, decreased function in LUE, decreased balance, pain, decreased endurance and therefore will continue to benefit from skilled PT intervention to enhance overall performance with activity tolerance, balance, postural control, ability to compensate for deficits, attention, awareness, coordination and knowledge of precautions.  Patient progressing toward long term goals..  Continue plan of care.  May need to downgrade goals if pt continues to not progress in therapies.   PT Short Term Goals Week 1:  PT Short Term Goal 1 (Week 1): Pt will perform bed mobility with S with HOB flat and without bed rails PT Short Term Goal 1 - Progress (Week 1): Progressing toward goal PT Short Term Goal 2 (Week 1): Pt will perform dynamic standing x 3 mins at min A level with RUE supported PT Short Term Goal 2 - Progress (Week 1): Progressing toward goal PT Short Term Goal 3 (Week 1): Pt will self propel w/c x 150' using R hemi technique at S level PT Short Term Goal 3 - Progress (Week 1): Progressing toward goal (not consistently) PT Short Term Goal 4 (Week 1): Pt will verbalize pressure relief techniques with min  cues PT Short Term Goal 4 - Progress (Week 1): Progressing toward goal Week 2:  PT Short Term Goal 1 (Week 2): Pt will perform bed mobility with S with HOB flat and without bed rails PT Short Term Goal 2 (Week 2): Pt will perform dynamic standing x 3 mins at min A level with RUE supported PT Short Term Goal 3 (Week 2): Pt will self propel w/c x 150' using R hemi technique at S level PT Short Term Goal 4 (Week 2): Pt will verbalize pressure relief techniques with min cues PT Short Term Goal 5 (Week 2): Pt will perform stand/squat pivot transfer with min A.   Skilled Therapeutic Interventions/Progress Updates:   Pt received lying in bed, reclined in order to finish eating lunch.  Performed bed mobility with HOB flat and without rails in order to simulate home environment.  Pt requires mod A to elevate trunk.  Pt continues to resist weight shift to L hip, as well as forward lean when attempting to scoot hips forward to EOB.  Provided max A for scooting and mod A for squat pivot transfer to w/c.  Pt states new chair feels somewhat better than old chair.  Attempted to have pt self propel for approx 10 mins, however pt would attempt for a few seconds, then state "I can't."  Assisted remainder of distance to therapy gym in order to work on hamstring stretch x 2 reps of 4-5 mins.  Pt with decreased ability to relax RLE during stretching, despite max verbal cues.  Attempted standing on several  reps to RW then to high/low table, however pt unable to flex trunk enough or demonstrate enough forward weight shift to stand.  Requires max encouragement from therapist, however pt continues to have depressed mood.  Assisted pt to day room in order to partake in ice cream to assist with mood.  While seated, attempted to have pt sit upright unsupported to eat ice cream, however pt unable to perform and kept leaning to the R.  Pt assisted back to room and in w/c with all needs in reach.   Therapy Documentation Precautions:   Precautions Precautions: Fall Precaution Comments: LT AKA wrap, pt is anxious with mobility, history of Left hemiparesis and increased tone. Restrictions Weight Bearing Restrictions: No   Vital Signs: Therapy Vitals Temp: 97.5 F (36.4 C) Temp src: Oral Pulse Rate: 97 Resp: 18 BP: 154/72 mmHg Patient Position (if appropriate): Lying Oxygen Therapy SpO2: 92 % O2 Device: None (Room air) Pain: Pt continues to have 10/10 pain in buttocks when asked.  Provided pt with new 16x16 chair with Roho cushion for improved pressure relief.     See FIM for current functional status  Therapy/Group: Individual Therapy  Denice Bors 10/21/2013, 3:54 PM

## 2013-10-21 NOTE — Patient Care Conference (Signed)
Inpatient RehabilitationTeam Conference and Plan of Care Update Date: 10/21/2013   Time: 11:15 AM    Patient Name: Donald [name]      Medical Record Number: [id number]  Date of Birth: 04/20/1946 Sex: Male         Room/Bed: 4W23C/4W23C-01 Payor Info: Payor: MEDICARE / Plan: MEDICARE PART A AND B / Product Type: *No Product type* /    Admitting Diagnosis: L AKA OLD CVA  Admit Date/Time:  10/13/2013  4:55 PM Admission Comments: No comment available   Primary Diagnosis:  <principal problem not specified> Principal Problem: <principal problem not specified>  Patient Active Problem List   Diagnosis Date Noted  . S/P AKA (above knee amputation) 10/13/2013  . Uncontrolled pain 10/10/2013  . Type II or unspecified type diabetes mellitus with peripheral circulatory disorders, uncontrolled(250.72) 10/10/2013  . Protein-calorie malnutrition, severe 10/09/2013  . Diabetes mellitus with circulatory complication 90/24/0973  . HTN (hypertension) 10/07/2013  . Anemia 10/07/2013  . Ischemia of extremity 10/07/2013  . Ischemic leg 09/05/2013  . Wound discharge 04/09/2013  . Hemiparesis affecting left side as late effect of stroke 01/05/2013  . Abnormality of gait 09/03/2012  . Other acquired deformity of ankle and foot(736.79) 09/03/2012  . Aftercare following surgery of the circulatory system, [place] 05/15/2012  . 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 Peripheral vascular disease, unspecified 03/06/2012  . PVD (peripheral vascular disease) 02/21/2012  . Occlusion and stenosis of carotid artery without mention of cerebral infarction 02/21/2012  . Physical deconditioning 01/31/2012  . Atherosclerotic peripheral vascular disease with rest pain 01/24/2012  . Preoperative evaluation to rule out surgical contraindication 01/24/2012  . Anal carcinoma 10/30/2011  . Anal cancer 02/26/2011  . [place] MALIGNANT NEOPLASM OF LARGE INTESTINE&RECTUM 11/09/2008   . NAUSEA AND VOMITING 11/09/2008  . CONSTIPATION 08/18/2008  . RECTAL BLEEDING 08/18/2008  . ABDOMINAL PAIN -GENERALIZED 08/18/2008  . DM 08/17/2008  . HYPERLIPIDEMIA 08/17/2008  . HEMORRHOIDS 08/17/2008  . CONSTIPATION, CHRONIC 08/17/2008  . CEREBROVASCULAR ACCIDENT, HX OF 08/17/2008    Expected Discharge Date: Expected Discharge Date: 10/27/13  Team Members Present: Physician leading conference: Dr. Alysia Penna Social Worker Present: Ovidio Kin, LCSW Nurse Present: Elliot Cousin, RN PT Present: Georjean Mode, PT;Blair Hobble, PT OT Present: Antony Salmon, OT SLP Present: Other (comment) Elmyra Ricks Page-SP) PPS Coordinator present : Daiva Nakayama, RN, CRRN     Current Status/Progress Goal Weekly Team Focus  Medical   pain in buttocks  improve pain control  increase prn Oxy Dose   Bowel/Bladder   Continent of bladder/ incont epiisodes of bowel   continent of B+B  Asses for need for supp and Q2hr toileting    Swallow/Nutrition/ Hydration     wfl        ADL's   min assist for UB selfcare and mod to max assist for LB selfcare sit to stand.  Increased buttocks pain with all movement, especially scooting tasks.  Mod assist for toilet transfers with max assist for clothing management.  He is able to manage his own hygiene in sitting with supervision  min assist bathing and dressing and supervision for toileting  selfcare re-training, balance re-training, pain management, safety, use of DME, pt/family educaiton, neuro re-ed   Mobility   S to min A for bed mobility with use of handrail, mod A for stand/squat pivot transfers, mod A for standing, limited activity tolerance, continues to have increased pain in buttocks, limiting therapy.  he has taken several hops with  L PFRW, but continues to need max encouragement from therapist.   S to Mod I at w/c level.   transfers, strengthening, balance, endurance, pain management, gait   Communication     na        Safety/Cognition/  Behavioral Observations  Pt adheres to safety precautions and safety plan  Pt to remain compliant with safety plan/ precautions  Reorient pt to safety plan and precautions PRN   Pain   Pain uncontrolled; scheduled oxycontin  Pain at/ below level 5  Assess pain at beigging of shift and PRN   Skin   Buttocks with MASD and pressure breakdown on sacrum  Wound healing  Continue treatment of [name] and powder PRN      *See Care Plan and progress notes for long and short-term goals.  Barriers to Discharge: skin issues, pain    Possible Resolutions to Barriers:  cont aggressive wound care    Discharge Planning/Teaching Needs:  Home with wife who has assisted with pt's care prior to admission-here daily and provides emotional support.  Pt being seen by neuro-psych and sw for support      Team Discussion:  Pt not progressing as thought would, mod assist still.  Slow progress.  Skin issues hindering his participation and progress.  Will discuss with wife the plan.  Pain issues.  Neuro-psych seeing  Revisions to Treatment Plan:  May change discharge plan-discuss with wife   Continued Need for Acute Rehabilitation Level of Care: The patient requires daily medical management by a physician with specialized training in physical medicine and rehabilitation for the following conditions: Daily direction of a multidisciplinary physical rehabilitation program to ensure safe treatment while eliciting the highest outcome that is of practical value to the patient.: Yes Daily medical management of patient stability for increased activity during participation in an intensive rehabilitation regime.: Yes Daily analysis of laboratory values and/or radiology reports with any subsequent need for medication adjustment of medical intervention for : Neurological problems;Post surgical problems  D[place], Gardiner Rhyme 10/21/2013, 1:17 PM

## 2013-10-21 NOTE — Progress Notes (Signed)
Subjective/Complaints: 68 y.o. right-handed male with history of CVA with left-sided weakness, HTN, PVD with multiple revascularization procedures as well his recent thrombectomy of occluded FPBG with 4 compartment fasciotomy requiring VAC 5/ 2015. He was d/c from CIR on 09/24/13 and was ambulating at supervision level with RW. He started developing increasing LLE pain with onset of numbness 10/07/13 due to recurrent thrombosis left femoral popliteal bypass. Patient has refused chronic coumadin therapy as well as removal of infected L-axillofemoral BG. High L-AKA recommended due to level of demarcation. On 10/08/13, patient underwent L-AKA by Dr. Trula Slade and post op with severe depressive symptoms with lability as well as poor pain control. He was started on Oxycontin for more pain management as well as Toradol. Was treated with IV iron infusion for anemia. Pschiatry consulted for input and patient started on Cymbalta for severe acute depressive symptoms  Slept better per pt but more lethargic this am Mainly stays on Left side Review of Systems - Negative except Left buttocks pain Review of Systems -  Objective: Vital Signs: Blood pressure 95/68, pulse 98, temperature 97 F (36.1 C), temperature source Oral, resp. rate 20, weight 75.137 kg (165 lb 10.4 oz), SpO2 93.00%. No results found. Results for orders placed during the hospital encounter of 10/13/13 (from the past 72 hour(s))  GLUCOSE, CAPILLARY     Status: Abnormal   Collection Time    10/18/13 11:27 AM      Result Value Ref Range   Glucose-Capillary 134 (*) 70 - 99 mg/dL   Comment 1 Notify RN    GLUCOSE, CAPILLARY     Status: Abnormal   Collection Time    10/18/13  4:29 PM      Result Value Ref Range   Glucose-Capillary 134 (*) 70 - 99 mg/dL  GLUCOSE, CAPILLARY     Status: Abnormal   Collection Time    10/18/13  9:28 PM      Result Value Ref Range   Glucose-Capillary 149 (*) 70 - 99 mg/dL  GLUCOSE, CAPILLARY     Status:  Abnormal   Collection Time    10/19/13  8:20 AM      Result Value Ref Range   Glucose-Capillary 134 (*) 70 - 99 mg/dL   Comment 1 Notify RN    GLUCOSE, CAPILLARY     Status: Abnormal   Collection Time    10/19/13 11:33 AM      Result Value Ref Range   Glucose-Capillary 187 (*) 70 - 99 mg/dL   Comment 1 Notify RN    GLUCOSE, CAPILLARY     Status: Abnormal   Collection Time    10/19/13  2:31 PM      Result Value Ref Range   Glucose-Capillary 144 (*) 70 - 99 mg/dL   Comment 1 Notify RN    GLUCOSE, CAPILLARY     Status: Abnormal   Collection Time    10/19/13  4:19 PM      Result Value Ref Range   Glucose-Capillary 130 (*) 70 - 99 mg/dL   Comment 1 Notify RN    GLUCOSE, CAPILLARY     Status: Abnormal   Collection Time    10/19/13  9:16 PM      Result Value Ref Range   Glucose-Capillary 199 (*) 70 - 99 mg/dL   Comment 1 Notify RN    BASIC METABOLIC PANEL     Status: Abnormal   Collection Time    10/20/13  5:14 AM  Result Value Ref Range   Sodium 139  137 - 147 mEq/L   Potassium 4.3  3.7 - 5.3 mEq/L   Chloride 98  96 - 112 mEq/L   CO2 26  19 - 32 mEq/L   Glucose, Bld 144 (*) 70 - 99 mg/dL   BUN 19  6 - 23 mg/dL   Creatinine, Ser 0.82  0.50 - 1.35 mg/dL   Calcium 8.9  8.4 - 10.5 mg/dL   GFR calc non Af Amer 88 (*) >90 mL/min   GFR calc Af Amer >90  >90 mL/min   Comment: (NOTE)     The eGFR has been calculated using the CKD EPI equation.     This calculation has not been validated in all clinical situations.     eGFR's persistently <90 mL/min signify possible Chronic Kidney     Disease.  GLUCOSE, CAPILLARY     Status: Abnormal   Collection Time    10/20/13  7:14 AM      Result Value Ref Range   Glucose-Capillary 156 (*) 70 - 99 mg/dL   Comment 1 Notify RN    OCCULT BLOOD X 1 CARD TO LAB, STOOL     Status: None   Collection Time    10/20/13  7:30 AM      Result Value Ref Range   Fecal Occult Bld NEGATIVE  NEGATIVE  GLUCOSE, CAPILLARY     Status: Abnormal    Collection Time    10/20/13 11:56 AM      Result Value Ref Range   Glucose-Capillary 142 (*) 70 - 99 mg/dL   Comment 1 Notify RN    GLUCOSE, CAPILLARY     Status: Abnormal   Collection Time    10/20/13  4:15 PM      Result Value Ref Range   Glucose-Capillary 167 (*) 70 - 99 mg/dL  GLUCOSE, CAPILLARY     Status: Abnormal   Collection Time    10/20/13  9:05 PM      Result Value Ref Range   Glucose-Capillary 198 (*) 70 - 99 mg/dL  GLUCOSE, CAPILLARY     Status: Abnormal   Collection Time    10/21/13  7:07 AM      Result Value Ref Range   Glucose-Capillary 170 (*) 70 - 99 mg/dL   Comment 1 Notify RN       HEENT: poor oral hygiene, left facial weakness Cardio: RRR and no  murmur Resp: CTA B/L and unlabored GI: BS positive and NT, ND Extremity:  Pulses positive and No Edema Skin:  No satellite lesions, Right buttocks dry with mild erythema, left butocks with Grade 2 decub Neuro: Alert/Oriented, Abnormal Motor 2-/5 R delt bi tri grip, 2- L HF, RIght side normal strength, Tone:  Increased Reduced LT sensation R foot Flexor tone LUE and Dysarthric Musc/Skel:  Other left AKA Gen NAD Ext R foot warm, intrinsic muscle atrophy,no lesions  Assessment/Plan: 1. Functional deficits secondary to Left AKA /PAD which require 3+ hours per day of interdisciplinary therapy in a comprehensive inpatient rehab setting. Physiatrist is providing close team supervision and 24 hour management of active medical problems listed below. Physiatrist and rehab team continue to assess barriers to discharge/monitor patient progress toward functional and medical goals. FIM: FIM - Bathing Bathing Steps Patient Completed: Chest;Left Arm;Abdomen;Front perineal area;Right upper leg;Right lower leg (including foot) Bathing: 3: Mod-Patient completes 5-7 59f10 parts or 50-74%  FIM - Upper Body Dressing/Undressing Upper body dressing/undressing steps patient completed:  Thread/unthread right sleeve of pullover  shirt/dresss;Thread/unthread left sleeve of pullover shirt/dress;Put head through opening of pull over shirt/dress Upper body dressing/undressing: 4: Min-Patient completed 75 plus % of tasks FIM - Lower Body Dressing/Undressing Lower body dressing/undressing steps patient completed: Don/Doff right shoe;Thread/unthread right pants leg Lower body dressing/undressing: 2: Max-Patient completed 25-49% of tasks  FIM - Toileting Toileting steps completed by patient: Performs perineal hygiene Toileting: 1: Two helpers  FIM - Radio producer Devices: Recruitment consultant Transfers: 3-To toilet/BSC: Mod A (lift or lower assist);3-From toilet/BSC: Mod A (lift or lower assist)  FIM - Bed/Chair Transfer Bed/Chair Transfer Assistive Devices: Arm rests Bed/Chair Transfer: 3: Bed > Chair or W/C: Mod A (lift or lower assist);3: Chair or W/C > Bed: Mod A (lift or lower assist)  FIM - Locomotion: Wheelchair Distance: 150 Locomotion: Wheelchair: 0: Activity did not occur FIM - Locomotion: Ambulation Locomotion: Ambulation Assistive Devices: Engineer, agricultural Ambulation/Gait Assistance: 1: +2 Total assist (Mod w/ +2 for chair follow) Locomotion: Ambulation: 0: Activity did not occur  Comprehension Comprehension Mode: Auditory Comprehension: 5-Understands basic 90% of the time/requires cueing < 10% of the time  Expression Expression Mode: Verbal Expression: 5-Expresses basic 90% of the time/requires cueing < 10% of the time.  Social Interaction Social Interaction: 5-Interacts appropriately 90% of the time - Needs monitoring or encouragement for participation or interaction.  Problem Solving Problem Solving: 5-Solves basic 90% of the time/requires cueing < 10% of the time  Memory Memory: 6-More than reasonable amt of time  Medical Problem List and Plan:  1. 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 DVT Prophylaxis/Anticoagulation: Pharmaceutical:  Lovenox  3. Pain Management: On oxycontin  24m bid, reduce to 469mdue to lethargy. Oxycodone 10 mg prn breakthrough pain.  4. Depressed Mood: continue cymbalta.   -still a major issue  5. Neuropsych: This patient is capable of making decisions on his own behalf.  6. HTN: Monitor every 8 hours. Lisinopril resumed today.  7. H/o rectal cancer: Has problems with rectal pain when constipated. Last BM 6/20, adjust meds 8. Chronic constipation: Will increase Senna S to 2 pills bid. Suppository every 24 hours if no BM.  9. 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 Continue Lantus 5 units and will titrate to home as indicated 10. Iron deficiency anemia: Loaded with IV iron. Continue po supplement. Check orthostatics, stool for OB, consider transfusion if symptomatic 11. H/o CVA with left spastic hemiparesis: Continue aggrenox.  12. Skin: Fungal infection clearing , Right buttocks improving but Left buttocks is not, because of Left hemiplegia, pt tends to lay on Left side, will Ask nsg to position pt on R side Added diflucan, continue local care per WOC recs. Air mattress  -reconsult WOC for further recs  -needs to be OOB as much as possible given sacral wounds, All meals in chair   LOS (Days) 8 [place] 10/21/2013, 7:47 AM

## 2013-10-21 NOTE — Progress Notes (Signed)
NUTRITION FOLLOW UP  Intervention:   -Continue Boost Plus BID, patient encouraged to sip frequently throughout the day.  -Continue snacks between meals -Consider appetite stimulant  Nutrition Dx:   Inadequate oral intake related to poor appetite as evidenced by pt report. Ongoing.  Goal:   Patient will meet >/=90% of estimated nutrition needs, ongoing  Monitor:   PO intake, weight, labs  Assessment:   68 y.o. right-handed male with history of CVA with left-sided weakness, HTN, PVD with multiple revascularization procedures as well his recent thrombectomy of occluded FPBG with 4 compartment fasciotomy requiring VAC 5/ 2015. He was d/c from CIR on 09/24/13 and was ambulating at supervision level with RW. He started developing increasing LLE pain with onset of numbness 10/07/13 due to recurrent thrombosis left femoral popliteal bypass. On 10/08/13, patient underwent L-AKA.  Patient seen per Student-Dietitian during acute stay 6/10.   Patient continues to have severe depression. He reports that he continues to eat poorly with 0-35% intake of meals. He is drinking some Boost Plus, but has <50% intake of these. Patient requesting updated snack preferences.   Height: Ht Readings from Last 1 Encounters:  10/07/13 5\' 9"  (1.753 m)    Weight Status:   Wt Readings from Last 1 Encounters:  10/13/13 165 lb 10.4 oz (75.137 kg)    Re-estimated needs:  Kcal: 1800-2000  Protein: 90-100 gm  Fluid: 1.8-2.0 L  Skin: moisture associated skin damage (MASD) with suspected fungal overgrowth to perineum/gluteal cleft  Diet Order: Carb Control   Intake/Output Summary (Last 24 hours) at 10/21/13 1348 Last data filed at 10/21/13 0900  Gross per 24 hour  Intake    540 ml  Output    625 ml  Net    -85 ml    Last BM: 6/23   Labs:   Recent Labs Lab 10/20/13 0514  NA 139  K 4.3  CL 98  CO2 26  BUN 19  CREATININE 0.82  CALCIUM 8.9  GLUCOSE 144*    CBG (last 3)   Recent Labs  10/20/13 2105 10/21/13 0707 10/21/13 1149  GLUCAP 198* 170* 168*    Scheduled Meds: . acetaminophen  650 mg Oral Once  . baclofen  5 mg Oral QHS  . dipyridamole-aspirin  1 capsule Oral BID  . docusate sodium  100 mg Oral BID  . DULoxetine  30 mg Oral Daily  . enoxaparin (LOVENOX) injection  40 mg Subcutaneous Q24H  . ferrous sulfate  325 mg Oral BID WC  . fluconazole  100 mg Oral Daily  . Gerhardt's butt cream   Topical BID  . insulin aspart  0-5 Units Subcutaneous QHS  . insulin aspart  0-9 Units Subcutaneous TID WC  . insulin glargine  5 Units Subcutaneous QHS  . lactose free nutrition  237 mL Oral BID BM  . lisinopril  2.5 mg Oral Daily  . multivitamin with minerals  1 tablet Oral Daily  . nicotine  7 mg Transdermal Daily  . OxyCODONE  40 mg Oral Q12H  . senna-docusate  2 tablet Oral BID  . traZODone  25 mg Oral QHS    Continuous Infusions:   Larey Seat, RD, LDN Pager #: [phone] After-Hours Pager #: [phone]

## 2013-10-21 NOTE — Progress Notes (Signed)
Patient with brown/redish drainage from left stump incision. Cleaned incision and 4x4's and kerlix applied to stump. adm

## 2013-10-21 NOTE — Progress Notes (Signed)
Social Work Elease Hashimoto, LCSW Social Worker Signed  Patient Care Conference Service date: 10/21/2013 1:17 PM  Inpatient RehabilitationTeam Conference and Plan of Care Update Date: 10/21/2013   Time: 11:15 AM     Patient Name: Donald [name]       Medical Record Number: [id number]   Date of Birth: May 03, 1945 Sex: Male         Room/Bed: 4W23C/4W23C-01 Payor Info: Payor: MEDICARE / Plan: MEDICARE PART A AND B / Product Type: *No Product type* /   Admitting Diagnosis: 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  S/P AKA (above knee amputation)  10/13/2013   .  Uncontrolled pain  10/10/2013   .  Type II or unspecified type diabetes mellitus with peripheral circulatory disorders, uncontrolled(250.72)  10/10/2013   .  Protein-calorie malnutrition, severe  10/09/2013   .  Diabetes mellitus with circulatory complication  90/24/0973   .  HTN (hypertension)  10/07/2013   .  Anemia  10/07/2013   .  Ischemia of extremity  10/07/2013   .  Ischemic leg  09/05/2013   .  Wound discharge  04/09/2013   .  Hemiparesis affecting left side as late effect of stroke  01/05/2013   .  Abnormality of gait  09/03/2012   .  Other acquired deformity of ankle and foot(736.79)  09/03/2012   .  Aftercare following surgery of the circulatory system, [place]  05/15/2012   .  Atherosclerosis of native arteries of the extremities with ulceration(440.23)  04/10/2012   .  Peripheral vascular disease, unspecified  03/06/2012   .  PVD (peripheral vascular disease)  02/21/2012   .  Occlusion and stenosis of carotid artery without mention of cerebral infarction  02/21/2012   .  Physical deconditioning  01/31/2012   .  Atherosclerotic peripheral vascular disease with rest pain  01/24/2012   .   Preoperative evaluation to rule out surgical contraindication  01/24/2012   .  Anal carcinoma  10/30/2011   .  Anal cancer  02/26/2011   .  [place] MALIGNANT NEOPLASM OF LARGE INTESTINE&RECTUM  11/09/2008   .  NAUSEA AND VOMITING  11/09/2008   .  CONSTIPATION  08/18/2008   .  RECTAL BLEEDING  08/18/2008   .  ABDOMINAL PAIN -GENERALIZED  08/18/2008   .  DM  08/17/2008   .  HYPERLIPIDEMIA  08/17/2008   .  HEMORRHOIDS  08/17/2008   .  CONSTIPATION, CHRONIC  08/17/2008   .  CEREBROVASCULAR ACCIDENT, HX OF  08/17/2008     Expected Discharge Date: Expected Discharge Date: 10/27/13  Team Members Present: Physician leading conference: Dr. Alysia Penna Social Worker Present: Ovidio Kin, LCSW Nurse Present: Elliot Cousin, RN PT Present: Georjean Mode, PT;Blair Hobble, PT OT Present: Antony Salmon, OT SLP Present: Other (comment) Elmyra Ricks Page-SP) PPS Coordinator present : Daiva Nakayama, RN, CRRN        Current Status/Progress  Goal  Weekly Team Focus   Medical     pain in buttocks  improve pain control  increase prn Oxy Dose   Bowel/Bladder     Continent of bladder/ incont epiisodes of bowel   continent of B+B  Asses for need for supp and Q2hr toileting    Swallow/Nutrition/  Hydration     wfl       ADL's     min assist for UB selfcare and mod to max assist for LB selfcare sit to stand.  Increased buttocks pain with all movement, especially scooting tasks.  Mod assist for toilet transfers with max assist for clothing management.  He is able to manage his own hygiene in sitting with supervision  min assist bathing and dressing and supervision for toileting  selfcare re-training, balance re-training, pain management, safety, use of DME, pt/family educaiton, neuro re-ed   Mobility     S to min A for bed mobility with use of handrail, mod A for stand/squat pivot transfers, mod A for standing, limited activity tolerance, continues to have increased pain in buttocks, limiting therapy.  he has  taken several hops with L PFRW, but continues to need max encouragement from therapist.   S to Mod I at w/c level.   transfers, strengthening, balance, endurance, pain management, gait   Communication     na       Safety/Cognition/ Behavioral Observations    Pt adheres to safety precautions and safety plan  Pt to remain compliant with safety plan/ precautions  Reorient pt to safety plan and precautions PRN   Pain     Pain uncontrolled; scheduled oxycontin  Pain at/ below level 5  Assess pain at beigging of shift and PRN   Skin     Buttocks with MASD and pressure breakdown on sacrum  Wound healing  Continue treatment of [name] and powder PRN     *See Care Plan and progress notes for long and short-term goals.    Barriers to Discharge:  skin issues, pain      Possible Resolutions to Barriers:    cont aggressive wound care      Discharge Planning/Teaching Needs:    Home with wife who has assisted with pt's care prior to admission-here daily and provides emotional support.  Pt being seen by neuro-psych and sw for support      Team Discussion:    Pt not progressing as thought would, mod assist still.  Slow progress.  Skin issues hindering his participation and progress.  Will discuss with wife the plan.  Pain issues.  Neuro-psych seeing   Revisions to Treatment Plan:    May change discharge plan-discuss with wife    Continued Need for Acute Rehabilitation Level of Care: The patient requires daily medical management by a physician with specialized training in physical medicine and rehabilitation for the following conditions: Daily direction of a multidisciplinary physical rehabilitation program to ensure safe treatment while eliciting the highest outcome that is of practical value to the patient.: Yes Daily medical management of patient stability for increased activity during participation in an intensive rehabilitation regime.: Yes Daily analysis of laboratory values and/or  radiology reports with any subsequent need for medication adjustment of medical intervention for : Neurological problems;Post surgical problems  Donald [name] 10/21/2013, 1:17 PM         Elease Hashimoto, LCSW Social Worker Signed  Patient Care Conference Service date: 10/14/2013 4:10 PM  Inpatient RehabilitationTeam Conference and Plan of Care Update Date: 10/14/2013   Time: 10;50 AM     Patient Name: Donald [name]       Medical Record Number: [id number]   Date of Birth: [birth year] Sex: Male         Room/Bed: 4W23C/4W23C-01 Payor Info: Payor: MEDICARE / Plan: MEDICARE PART A AND B /  Product Type: *No Product type* /   Admitting Diagnosis: 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  S/P AKA (above knee amputation)  10/13/2013   .  Uncontrolled pain  10/10/2013   .  Type II or unspecified type diabetes mellitus with peripheral circulatory disorders, uncontrolled(250.72)  10/10/2013   .  Protein-calorie malnutrition, severe  10/09/2013   .  Diabetes mellitus with circulatory complication  68/03/7516   .  HTN (hypertension)  10/07/2013   .  Anemia  10/07/2013   .  Ischemia of extremity  10/07/2013   .  Ischemic leg  09/05/2013   .  Wound discharge  04/09/2013   .  Hemiparesis affecting left side as late effect of stroke  01/05/2013   .  Abnormality of gait  09/03/2012   .  Other acquired deformity of ankle and foot(736.79)  09/03/2012   .  Aftercare following surgery of the circulatory system, [place]  05/15/2012   .  Atherosclerosis of native arteries of the extremities with ulceration(440.23)  04/10/2012   .  Peripheral vascular disease, unspecified  03/06/2012   .  PVD (peripheral vascular disease)  02/21/2012   .  Occlusion and stenosis of carotid  artery without mention of cerebral infarction  02/21/2012   .  Physical deconditioning  01/31/2012   .  Atherosclerotic peripheral vascular disease with rest pain  01/24/2012   .  Preoperative evaluation to rule out surgical contraindication  01/24/2012   .  Anal carcinoma  10/30/2011   .  Anal cancer  02/26/2011   .  [place] MALIGNANT NEOPLASM OF LARGE INTESTINE&RECTUM  11/09/2008   .  NAUSEA AND VOMITING  11/09/2008   .  CONSTIPATION  08/18/2008   .  RECTAL BLEEDING  08/18/2008   .  ABDOMINAL PAIN -GENERALIZED  08/18/2008   .  DM  08/17/2008   .  HYPERLIPIDEMIA  08/17/2008   .  HEMORRHOIDS  08/17/2008   .  CONSTIPATION, CHRONIC  08/17/2008   .  CEREBROVASCULAR ACCIDENT, HX OF  08/17/2008     Expected Discharge Date: Expected Discharge Date: 10/27/13  Team Members Present: Physician leading conference: Dr. Alysia Penna Social Worker Present: Ovidio Kin, LCSW Nurse Present: Elliot Cousin, RN PT Present: Cameron Sprang, PT OT Present: Antony Salmon, OT SLP Present: Other (comment);Gunnar Fusi, SLP Elmyra Ricks Page-SP) PPS Coordinator present : Ileana Ladd, Lelan Pons, RN, [facility]        Current Status/Progress  Goal  Weekly Team Focus   Medical     depressed on meds seen by Psych  maintain positive outlook  skin management   Bowel/Bladder     Continent of bowel and bladder, LBM 10/08/2013, Senna S 2 tab bid, Colace 100mg  biId  remain continent of bowel and bladder, remain free of skin break down and infection  Assess routine bowel movements daily, treat constipation.   Swallow/Nutrition/ Hydration     wfl       ADL's     eval pending       Mobility     eval pending       Communication     WFL       Safety/Cognition/ Behavioral Observations    no unsafe behaviors  Pain     Pain managed with Oxy IR 10 mg  Pain <3  Assess pain q shift and prn, keep pt pain level at or < 3   Skin     monitoring skin-mattress over lay for bed  WOC eval  provide skin dressing and  care     *See Care Plan and progress notes for long and short-term goals.    Barriers to Discharge:  skin issues, potential for open lesions      Possible Resolutions to Barriers:    cont local and systemic care of peri rash      Discharge Planning/Teaching Needs:    Home with wife who can provide assistance and was prior to admission    Team Discussion:    Skin issues has overlay mattress. New eval in all areas.  Sleeping better, would benefit from Neuro-psych seeing him while here.   Revisions to Treatment Plan:    New eval    Continued Need for Acute Rehabilitation Level of Care: The patient requires daily medical management by a physician with specialized training in physical medicine and rehabilitation for the following conditions: Daily direction of a multidisciplinary physical rehabilitation program to ensure safe treatment while eliciting the highest outcome that is of practical value to the patient.: Yes Daily medical management of patient stability for increased activity during participation in an intensive rehabilitation regime.: Yes Daily analysis of laboratory values and/or radiology reports with any subsequent need for medication adjustment of medical intervention for : Neurological problems;Other;Post surgical problems  Elease Hashimoto 10/15/2013, 8:42 AM          Patient ID: [name], male   DOB: [birth year], 68 y.o.   MRN: [id number]

## 2013-10-22 ENCOUNTER — Encounter (HOSPITAL_COMMUNITY): Payer: Self-pay

## 2013-10-22 ENCOUNTER — Inpatient Hospital Stay (HOSPITAL_COMMUNITY): Payer: Self-pay | Admitting: Occupational Therapy

## 2013-10-22 ENCOUNTER — Inpatient Hospital Stay (HOSPITAL_COMMUNITY): Payer: Medicare Other | Admitting: Occupational Therapy

## 2013-10-22 ENCOUNTER — Inpatient Hospital Stay (HOSPITAL_COMMUNITY): Payer: Self-pay | Admitting: Rehabilitation

## 2013-10-22 ENCOUNTER — Encounter (HOSPITAL_COMMUNITY): Payer: Self-pay | Admitting: Occupational Therapy

## 2013-10-22 DIAGNOSIS — I739 Peripheral vascular disease, unspecified: Secondary | ICD-10-CM

## 2013-10-22 DIAGNOSIS — G8110 Spastic hemiplegia affecting unspecified side: Secondary | ICD-10-CM

## 2013-10-22 DIAGNOSIS — E119 Type 2 diabetes mellitus without complications: Secondary | ICD-10-CM

## 2013-10-22 DIAGNOSIS — I69959 Hemiplegia and hemiparesis following unspecified cerebrovascular disease affecting unspecified side: Secondary | ICD-10-CM

## 2013-10-22 DIAGNOSIS — S78119A Complete traumatic amputation at level between unspecified hip and knee, initial encounter: Secondary | ICD-10-CM

## 2013-10-22 DIAGNOSIS — L98499 Non-pressure chronic ulcer of skin of other sites with unspecified severity: Secondary | ICD-10-CM

## 2013-10-22 LAB — GLUCOSE, CAPILLARY
GLUCOSE-CAPILLARY: 166 mg/dL — AB (ref 70–99)
Glucose-Capillary: 138 mg/dL — ABNORMAL HIGH (ref 70–99)
Glucose-Capillary: 198 mg/dL — ABNORMAL HIGH (ref 70–99)
Glucose-Capillary: 161 mg/dL — ABNORMAL HIGH (ref 70–99)

## 2013-10-22 MED ORDER — COLLAGENASE 250 UNIT/GM EX OINT
TOPICAL_OINTMENT | Freq: Every day | CUTANEOUS | Status: DC
  Administered 2013-10-22: 15:00:00 via TOPICAL
  Administered 2013-10-23: 1 via TOPICAL
  Administered 2013-10-26 – 2013-10-28 (×4): via TOPICAL
  Filled 2013-10-22 (×3): qty 30

## 2013-10-22 MED ORDER — CEPHALEXIN 250 MG PO CAPS
250.0000 mg | ORAL_CAPSULE | Freq: Three times a day (TID) | ORAL | Status: DC
  Administered 2013-10-22 – 2013-10-27 (×16): 250 mg via ORAL
  Filled 2013-10-22 (×19): qty 1

## 2013-10-22 MED ORDER — ALTEPLASE 2 MG IJ SOLR
2.0000 mg | Freq: Once | INTRAMUSCULAR | Status: AC
  Administered 2013-10-22: 2 mg
  Filled 2013-10-22: qty 2

## 2013-10-22 NOTE — Progress Notes (Signed)
Recreational Therapy Session Note  Patient Details  Name: Donald [name] MRN: [id number] Date of Birth: 05-02-1945 Today's Date: 10/22/2013  Met with pt 6/18 to complete leisure screen & again on 6/23 with PT for TR services.  Pt expressed limited interest in TR services initially as pt states he is limited by pain & endurance.  Will continue to monitor, no formal treatment at this time.   [place] 10/22/2013, 8:38 AM

## 2013-10-22 NOTE — Progress Notes (Signed)
Physical Therapy Session Note  Patient Details  Name: Donald [name] MRN: [id number] Date of Birth: [birth year]  Today's Date: 10/22/2013 Time: 1116-1200 Time Calculation (min): 44 min  Short Term Goals: Week 2:  PT Short Term Goal 1 (Week 2): Pt will perform bed mobility with S with HOB flat and without bed rails PT Short Term Goal 2 (Week 2): Pt will perform dynamic standing x 3 mins at min A level with RUE supported PT Short Term Goal 3 (Week 2): Pt will self propel w/c x 150' using R hemi technique at S level PT Short Term Goal 4 (Week 2): Pt will verbalize pressure relief techniques with min cues PT Short Term Goal 5 (Week 2): Pt will perform stand/squat pivot transfer with min A.   Skilled Therapeutic Interventions/Progress Updates:   Pt received lying in bed in room, agreeable to PT session. Note that pt with new dressing applied to L AKA, however did not have shrinker or ace wrap donned.  Called RN in order to assist with applying Ace wrap prior to OOB.  Performed bed mobility with min A level with bed rails with cues for increased forward weight shift once seated to get to EOB.  Transferred to w/c at mod A level with continued cues for hand placement and safety.  Skilled session focused on w/c mobility, functional sit<>stands, standing balance and tolerance.  Performed w/c mobility using R hemi technique x 150' at S level with several rest breaks due to fatigue.  Remainder of session focused on standing x 4 reps while given task to "wash face" or play tic tac toe to continue to address balance.  Pt very anxious about getting RUE support go to draw on mirror, but when did requires mod to max A to maintain standing.  Pt assisted back to room and left in w/c with all needs in reach.    Therapy Documentation Precautions:  Precautions Precautions: Fall Precaution Comments: LT AKA wrap, pt is anxious with mobility, history of Left hemiparesis and increased tone., buttocks wounds   Restrictions Weight Bearing Restrictions: No   Vital Signs: Therapy Vitals Temp: 98 F (36.7 C) Temp src: Oral Pulse Rate: 103 Resp: 18 BP: 151/63 mmHg Patient Position (if appropriate): Sitting Oxygen Therapy SpO2: 97 % Pain: Pain Assessment Pain Assessment: Faces Pain Score: 6  Faces Pain Scale: Hurts even more Pain Type: Acute pain Pain Location: Buttocks Pain Orientation: Right;Left Pain Intervention(s): Repositioned;Emotional support   Locomotion : Wheelchair Mobility Distance: 150   See FIM for current functional status  Therapy/Group: Individual Therapy  Denice Bors 10/22/2013, 4:36 PM

## 2013-10-22 NOTE — Progress Notes (Signed)
Occupational Therapy Session Note  Patient Details  Name: Donald [name] MRN: [id number] Date of Birth: 04-Jan-1946  Today's Date: 10/22/2013 Time: 0900-0930 Time Calculation (min): 30 min (missed 30 min due to pain and fatigue)  Short Term Goals: Week 1:  OT Short Term Goal 1 (Week 1): Short Term Goals = Long Term Goals due to ELOS  Skilled Therapeutic Interventions/Progress Updates:  Patient resting in w/c upon arrival with breakfast tray untouched in front of him.  Patient declines to eat anything on tray except drinking coffee and a special shake made by the staff.  RN also in room to administer insulin and encouraging patient to at least drink.  Patient initially reports he will try to do anything I want him to do.  However, when patient is challenged with simple movement (lateral lean in w/c or scoot in w/c), he reports he is too tired since he just finished a 60 min session and his buttocks hurts too bad.  Reviewed need for pressure relief on his buttocks while seated in w/c then demonstrated techniques.  Patient encouraged to return demonstrate by removing right arm rest and leaning into the bed.  He regularly maintains a right leaning posture in w/c with support on his right elbow/forearm however, he was unable to achieve an increased right lateral lean onto right hand or elbow/forearm without crying in pain.  Attempted to stand at sink and stand with walker with left platform and patient could not assist with scooting his bottom to front edge of seat to prepare for stand.  Patient requesting back to bed on his side to relieve pain therefore, assisted patient with a total assist squat pivot transfer w/c to bed.  Patient on his side with all items in reach and NT in the room.  Therapy Documentation Precautions:  Precautions Precautions: Fall Precaution Comments: LT AKA wrap, pt is anxious with mobility, history of Left hemiparesis and increased tone. Restrictions Weight Bearing  Restrictions: No Pain: Buttocks left side worse than right, not rated, repositioned and rest ADL: See FIM for current functional status  Therapy/Group: Individual Therapy  SHAFFER, CHRISTINA 10/22/2013, 9:51 AM

## 2013-10-22 NOTE — Progress Notes (Signed)
Subjective/Complaints: 68 y.o. right-handed male with history of CVA with left-sided weakness, HTN, PVD with multiple revascularization procedures as well his recent thrombectomy of occluded FPBG with 4 compartment fasciotomy requiring VAC 5/ 2015. He was d/c from CIR on 09/24/13 and was ambulating at supervision level with RW. He started developing increasing LLE pain with onset of numbness 10/07/13 due to recurrent thrombosis left femoral popliteal bypass. Patient has refused chronic coumadin therapy as well as removal of infected L-axillofemoral BG. High L-AKA recommended due to level of demarcation. On 10/08/13, patient underwent L-AKA by Dr. Trula Slade and post op with severe depressive symptoms with lability as well as poor pain control. He was started on Oxycontin for more pain management as well as Toradol. Was treated with IV iron infusion for anemia. Pschiatry consulted for input and patient started on Cymbalta for severe acute depressive symptoms  RN notes profuse sweating with air mattress now on regular mattress after discussion with WOC Mainly stays on Left side Review of Systems - Negative except Left buttocks pain  Objective: Vital Signs: Blood pressure 162/65, pulse 102, temperature 97.7 F (36.5 C), temperature source Oral, resp. rate 18, weight 75.137 kg (165 lb 10.4 oz), SpO2 96.00%. No results found. Results for orders placed during the hospital encounter of 10/13/13 (from the past 72 hour(s))  GLUCOSE, CAPILLARY     Status: Abnormal   Collection Time    10/19/13  8:20 AM      Result Value Ref Range   Glucose-Capillary 134 (*) 70 - 99 mg/dL   Comment 1 Notify RN    GLUCOSE, CAPILLARY     Status: Abnormal   Collection Time    10/19/13 11:33 AM      Result Value Ref Range   Glucose-Capillary 187 (*) 70 - 99 mg/dL   Comment 1 Notify RN    GLUCOSE, CAPILLARY     Status: Abnormal   Collection Time    10/19/13  2:31 PM      Result Value Ref Range   Glucose-Capillary 144  (*) 70 - 99 mg/dL   Comment 1 Notify RN    GLUCOSE, CAPILLARY     Status: Abnormal   Collection Time    10/19/13  4:19 PM      Result Value Ref Range   Glucose-Capillary 130 (*) 70 - 99 mg/dL   Comment 1 Notify RN    GLUCOSE, CAPILLARY     Status: Abnormal   Collection Time    10/19/13  9:16 PM      Result Value Ref Range   Glucose-Capillary 199 (*) 70 - 99 mg/dL   Comment 1 Notify RN    BASIC METABOLIC PANEL     Status: Abnormal   Collection Time    10/20/13  5:14 AM      Result Value Ref Range   Sodium 139  137 - 147 mEq/L   Potassium 4.3  3.7 - 5.3 mEq/L   Chloride 98  96 - 112 mEq/L   CO2 26  19 - 32 mEq/L   Glucose, Bld 144 (*) 70 - 99 mg/dL   BUN 19  6 - 23 mg/dL   Creatinine, Ser 0.82  0.50 - 1.35 mg/dL   Calcium 8.9  8.4 - 10.5 mg/dL   GFR calc non Af Amer 88 (*) >90 mL/min   GFR calc Af Amer >90  >90 mL/min   Comment: (NOTE)     The eGFR has been calculated using the CKD EPI equation.  This calculation has not been validated in all clinical situations.     eGFR's persistently <90 mL/min signify possible Chronic Kidney     Disease.  GLUCOSE, CAPILLARY     Status: Abnormal   Collection Time    10/20/13  7:14 AM      Result Value Ref Range   Glucose-Capillary 156 (*) 70 - 99 mg/dL   Comment 1 Notify RN    OCCULT BLOOD X 1 CARD TO LAB, STOOL     Status: None   Collection Time    10/20/13  7:30 AM      Result Value Ref Range   Fecal Occult Bld NEGATIVE  NEGATIVE  GLUCOSE, CAPILLARY     Status: Abnormal   Collection Time    10/20/13 11:56 AM      Result Value Ref Range   Glucose-Capillary 142 (*) 70 - 99 mg/dL   Comment 1 Notify RN    GLUCOSE, CAPILLARY     Status: Abnormal   Collection Time    10/20/13  4:15 PM      Result Value Ref Range   Glucose-Capillary 167 (*) 70 - 99 mg/dL  GLUCOSE, CAPILLARY     Status: Abnormal   Collection Time    10/20/13  9:05 PM      Result Value Ref Range   Glucose-Capillary 198 (*) 70 - 99 mg/dL  GLUCOSE, CAPILLARY      Status: Abnormal   Collection Time    10/21/13  7:07 AM      Result Value Ref Range   Glucose-Capillary 170 (*) 70 - 99 mg/dL   Comment 1 Notify RN    GLUCOSE, CAPILLARY     Status: Abnormal   Collection Time    10/21/13 11:49 AM      Result Value Ref Range   Glucose-Capillary 168 (*) 70 - 99 mg/dL   Comment 1 Notify RN    GLUCOSE, CAPILLARY     Status: Abnormal   Collection Time    10/21/13  4:35 PM      Result Value Ref Range   Glucose-Capillary 176 (*) 70 - 99 mg/dL   Comment 1 Notify RN    GLUCOSE, CAPILLARY     Status: Abnormal   Collection Time    10/21/13  8:57 PM      Result Value Ref Range   Glucose-Capillary 175 (*) 70 - 99 mg/dL  GLUCOSE, CAPILLARY     Status: Abnormal   Collection Time    10/22/13  7:20 AM      Result Value Ref Range   Glucose-Capillary 138 (*) 70 - 99 mg/dL     HEENT: poor oral hygiene, left facial weakness Cardio: RRR and no  murmur Resp: CTA B/L and unlabored GI: BS positive and NT, ND Extremity:  Pulses positive and No Edema Skin:  No satellite lesions, Right buttocks dry with mild erythema, left butocks with Grade 2 decub Neuro: Alert/Oriented, Abnormal Motor 2-/5 R delt bi tri grip, 2- L HF, RIght side normal strength, Tone:  Increased Reduced LT sensation R foot Flexor tone LUE and Dysarthric Musc/Skel:  Other left AKA Gen NAD Ext R foot warm, intrinsic muscle atrophy,no lesions  Assessment/Plan: 1. Functional deficits secondary to Left AKA /PAD which require 3+ hours per day of interdisciplinary therapy in a comprehensive inpatient rehab setting. Physiatrist is providing close team supervision and 24 hour management of active medical problems listed below. Physiatrist and rehab team continue to assess barriers to discharge/monitor patient  progress toward functional and medical goals. FIM: FIM - Bathing Bathing Steps Patient Completed: Chest;Left Arm;Abdomen;Front perineal area;Right upper leg;Right lower leg (including foot);Left  upper leg Bathing: 4: Min-Patient completes 8-9 70f10 parts or 75+ percent  FIM - Upper Body Dressing/Undressing Upper body dressing/undressing steps patient completed: Thread/unthread right sleeve of pullover shirt/dresss;Thread/unthread left sleeve of pullover shirt/dress;Put head through opening of pull over shirt/dress;Pull shirt over trunk Upper body dressing/undressing: 5: Supervision: Safety issues/verbal cues FIM - Lower Body Dressing/Undressing Lower body dressing/undressing steps patient completed: Don/Doff right shoe Lower body dressing/undressing: 3: Mod-Patient completed 50-74% of tasks  FIM - Toileting Toileting steps completed by patient: Performs perineal hygiene Toileting: 1: Two helpers  FIM - TRadio producerDevices: BRecruitment consultantTransfers: 3-To toilet/BSC: Mod A (lift or lower assist);3-From toilet/BSC: Mod A (lift or lower assist)  FIM - Bed/Chair Transfer Bed/Chair Transfer Assistive Devices: Arm rests;Bed rails Bed/Chair Transfer: 4: Supine > Sit: Min A (steadying Pt. > 75%/lift 1 leg);3: Sit > Supine: Mod A (lifting assist/Pt. 50-74%/lift 2 legs)  FIM - Locomotion: Wheelchair Distance: 150 Locomotion: Wheelchair: 2: Travels 547- 149 ft with supervision, cueing or coaxing FIM - Locomotion: Ambulation Locomotion: Ambulation Assistive Devices: WEngineer, agriculturalAmbulation/Gait Assistance: 1: +2 Total assist (Mod w/ +2 for chair follow) Locomotion: Ambulation: 0: Activity did not occur  Comprehension Comprehension Mode: Auditory Comprehension: 5-Understands complex 90% of the time/Cues < 10% of the time  Expression Expression Mode: Verbal Expression: 5-Expresses basic 90% of the time/requires cueing < 10% of the time.  Social Interaction Social Interaction: 5-Interacts appropriately 90% of the time - Needs monitoring or encouragement for participation or interaction.  Problem Solving Problem Solving: 5-Solves basic  90% of the time/requires cueing < 10% of the time  Memory Memory: 6-More than reasonable amt of time  Medical Problem List and Plan:  1. 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 DVT Prophylaxis/Anticoagulation: Pharmaceutical: Lovenox  3. Pain Management: On oxycontin  535mbid, reduce to 4053mue to lethargy. Oxycodone 10 mg prn breakthrough pain.  4. Depressed Mood: continue cymbalta.   -still a major issue  5. Neuropsych: This patient is capable of making decisions on his own behalf.  6. HTN: Monitor every 8 hours. Lisinopril resumed today.  7. H/o rectal cancer: Has problems with rectal pain when constipated. Last BM 6/20, adjust meds 8. Chronic constipation: Will increase Senna S to 2 pills bid. Suppository every 24 hours if no BM.  9. 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 Continue Lantus 5 units and will titrate to home as indicated 10. Iron deficiency anemia: Loaded with IV iron. Continue po supplement. Check orthostatics, stool for OB, consider transfusion if symptomatic 11. H/o CVA with left spastic hemiparesis: Continue aggrenox.  12. Skin: Fungal infection clearing , Right buttocks improving but Left buttocks is not, because of Left hemiplegia, pt tends to lay on Left side, will Ask nsg to position pt on R side Added diflucan, continue local care per WOC recs. Air mattress  - WOC following  -needs to be OOB as much as possible given sacral wounds, All meals in chair   LOS (Days) 9 A FACE TO FACE EVALUATION WAS PERFORMED  Donald [name],Donald [name] 10/22/2013, 8:00 AM

## 2013-10-22 NOTE — Consult Note (Signed)
  Neuropsychology Psychosocial Evaluation - [facility] _____________________________________________________________________________   Donald [name] is a 67 year old man, who was previously seen for an initial diagnostic evaluation to examine his emotional functioning in the setting of stroke and above-knee amputation.  This was conducted by neuropsychologist, Norton Pastel, PsyD.  In brief, Dr. Vikki Ports noted Mr. Decarolis suffering from severe depressive symptoms post-surgery with significant adjustment issues.  As such, continued neuropsychological follow-up for support was recommended.    Today, Mr. Dutter continued to describe feelings of "despair" related to his current physical condition.  He remarked that the most difficult part of this for him is seeing what it is putting his family through.  In particular, he stated that he is "ashamed" of what his wife has to sacrifice for him.  He also mentioned that it is hard for him to be optimistic, especially because of the physical pain that he is in on his rear end.  He mentioned that although therapy staff has pointed out small victories/accomplishments that he has made, he has found it to be difficult to celebrate the victories because of his pain.  Despite his significant pain, he said that it is not helpful to have more pain medication, as then he feels "too out of it" to do anything.  Mr. Trella acknowledged passive suicidal ideation at times, stating that he "doesn't see the point [in life]," but he denied plan or intent for self-harm.    Time was spent normalizing Mr. Reish's emotional reactions and attempting to draw on coping strategies that he has used in the past.  He described being involved in the [place] War as one of the most challenging emotional times in his past and time was spent exploring coping strategies that he used during that time to get through.  He was encouraged to employ  those strategies to make it through his current challenges.  Mr. Poyer stated that he felt like his wife was a strong support to him regarding his [place] experience and has also been a strong support for him now.  He stated that he feels as though he is able to talk to her and share even his deepest emotions.  He remarked that he would tell her if he ever felt that his despair was progressing to a place in which he thought that he might be a harm to himself.   In order to address his feelings of guilt over the burden that his current situation was placing on his wife, time was spent role-playing the scenario in which his wife was in his shoes and he was the caretaker.   He ultimately realized that he would respond similarly to how his wife is responding.  He was encouraged to try and remember that in order to provide himself with perspective regarding the situation when he feels down.  Staff members could try to remind him of that scenario in the future to help alleviate some of his grief.  Continued follow-up for support is warranted.  To this end, if staff members feel that it would be beneficial, a follow-up visit with the neuropsychologist prior to discharge could be conducted.    DIAGNOSES: CVA Depressive disorder, NOS  Donald [name], Psy.D.  Clinical Neuropsychologist

## 2013-10-22 NOTE — Progress Notes (Signed)
Occupational Therapy Session Note  Patient Details  Name: Donald [name] MRN: [id number] Date of Birth: Mar 09, 1946  Today's Date: 10/22/2013 Time: 0800-0845 Time Calculation (min): 45 min  Short Term Goals: Week 1:  OT Short Term Goal 1 (Week 1): Short Term Goals = Long Term Goals due to ELOS  Skilled Therapeutic Interventions/Progress Updates:    Pt performed transfer from supine to sit EOB with mod assist secondary to increased buttocks pain.  Pt in excruciating pain with all movement and with sitting in the wheelchair.  Transferred with max assist to the wheelchair squat pivot to the left side.   Pt with increased extension pattern with sit to squat requiring greater assist to bring his buttocks off of the bed.  He performed bathing at the sink with min assist overall.  Did not attempt dressing secondary to wounds and skin issues but instead donned a hospital gown.  Noted area on left AKA also draining and nursing made aware.    Therapy Documentation Precautions:  Precautions Precautions: Fall Precaution Comments: LT AKA wrap, pt is anxious with mobility, history of Left hemiparesis and increased tone., buttocks wounds  Restrictions Weight Bearing Restrictions: No  Pain: Pain Assessment Pain Assessment: Faces Pain Score: 5  Faces Pain Scale: No hurt Pain Type: Acute pain Pain Location: Buttocks Pain Intervention(s): Repositioned;Emotional support ADL: See FIM for current functional status  Therapy/Group: Individual Therapy  Lindsey Demonte OTR/L 10/22/2013, 11:23 AM

## 2013-10-22 NOTE — Progress Notes (Signed)
Physical Therapy Wound Treatment Patient Details  Name: Donald [name] MRN: [id number] Date of Birth: [birth year]  Today's Date: 10/22/2013 Time: 4098-1191 Time Calculation (min): 32 min  Subjective  Subjective: I've been worrying about how much this will hurt. Patient and Family Stated Goals: decr pain and heal wound Date of Onset:  (unknown) Prior Treatments: "every kind of cream you can imagine"  Pain Score: 15/10 "it never gets below a 10/10"; pt premedicated for pain prior to session  Wound Assessment  Clinical Statement: Pt with extremely painful open areas that appear to be due to friction (irregularly shaped), with worst area being lateral to rectum with very little intact skin between rectum and wound. Wounds can benefit from hydrotherapy to decr necrotic tissue, however will be dependent on controlling pt's pain to allow debridement. May need additional pain medicine prior to hydrotherapy session.  Wound / Incision (Open or Dehisced) 10/13/13 Other (Comment) Buttocks Left;Distal MSDA (Active)  Dressing Type Barrier Film (skin prep);Gauze (Comment);Moist to moist;Other (Comment) Santyl 10/22/2013  4:01 PM  Dressing Changed New 10/22/2013  4:01 PM  Dressing Status None 10/22/2013  4:01 PM  Dressing Change Frequency Daily 10/22/2013  4:01 PM  Site / Wound Assessment Painful;Yellow;Brown 10/22/2013  4:01 PM  % Wound base Red or Granulating 0% 10/22/2013  4:01 PM  % Wound base Yellow 100% 10/22/2013  4:01 PM  % Wound base Black 0% 10/22/2013  4:01 PM  % Wound base Other (Comment) 0% 10/22/2013  4:01 PM  Peri-wound Assessment Erythema (non-blanchable);Pink 10/22/2013  4:01 PM  Wound Length (cm) 7.8 cm 10/22/2013  4:01 PM  Wound Width (cm) 5.7 cm 10/22/2013  4:01 PM  Wound Depth (cm)  10/13/2013  5:12 PM  Margins Unattached edges (unapproximated) 10/22/2013  4:01 PM  Closure None 10/22/2013  4:01 PM  Drainage Amount None 10/22/2013  4:01 PM  Non-staged Wound Description Partial thickness  10/22/2013  4:01 PM  Treatment Debridement (Selective);Hydrotherapy (Pulse lavage);Packing (Saline gauze) 10/22/2013  4:01 PM     Wound / Incision (Open or Dehisced) 10/13/13 Other (Comment) Buttocks Left;Proximal MSDA; includes 9 open areas proximal to large buttock wound (Active)  Dressing Type Santyl, saline moist gauze, ABD, skin barrier film, tape 10/22/2013  4:01 PM  Dressing Changed New 10/22/2013  4:01 PM  Dressing Status Clean;Dry;Intact 10/22/2013  4:01 PM  Dressing Change Frequency Daily 10/22/2013  4:01 PM  Site / Wound Assessment Painful;Pink;Yellow 10/22/2013  4:01 PM  % Wound base Red or Granulating 20% 10/22/2013  4:01 PM  % Wound base Yellow 80% 10/22/2013  4:01 PM  % Wound base Black 0% 10/22/2013  4:01 PM  % Wound base Other (Comment) 0% 10/22/2013  4:01 PM  Peri-wound Assessment Erythema (non-blanchable);Maceration;Excoriated;Pink 10/22/2013  4:01 PM  Wound Length (cm) 6.5 cm 10/22/2013  4:01 PM  Wound Width (cm) 5.8 cm 10/22/2013  4:01 PM  Wound Depth (cm)  10/13/2013  5:12 PM  Tunneling (cm)  10/13/2013  5:12 PM  Margins Unattached edges (unapproximated) 10/22/2013  4:01 PM  Closure None 10/22/2013  4:01 PM  Drainage Amount None 10/22/2013  4:01 PM  Non-staged Wound Description Partial thickness 10/22/2013  4:01 PM  Treatment Debridement (Selective);Hydrotherapy (Pulse lavage);Packing (Saline gauze) 10/22/2013  4:01 PM      Hydrotherapy Pulsed lavage therapy - wound location: Lt buttocks and sacrum Pulsed Lavage with Suction (psi): 4 psi (required several breaks due to pain) Pulsed Lavage with Suction - Normal Saline Used: 1000 mL Pulsed Lavage Tip: Tip with splash shield Selective  Debridement Selective Debridement - Location: distal Lt buttock wound Selective Debridement - Tools Used: Forceps;Scissors Selective Debridement - Tissue Removed: yellow, brown slough   Wound Assessment and Plan  Wound Therapy - Assess/Plan/Recommendations Wound Therapy - Clinical Statement: Pt with  extremely painful open areas that appear to be due to friction (irregularly shaped), with worst area being lateral to rectum with very little intact skin between rectum and wound. Wounds can benefit from hydrotherapy to decr necrotic tissue, however will be dependent on controlling pt's pain to allow debridement.  Wound Therapy - Functional Problem List: limiting all mobility per PT & OT due to pain Factors Delaying/Impairing Wound Healing: Diabetes Mellitus;Immobility;Multiple medical problems Hydrotherapy Plan: Debridement;Dressing change;Patient/family education;Pulsatile lavage with suction Wound Therapy - Frequency: 6X / week Wound Therapy - Follow Up Recommendations: Home health RN Wound Plan: see above  Wound Therapy Goals- Improve the function of patient's integumentary system by progressing the wound(s) through the phases of wound healing (inflammation - proliferation - remodeling) by: Decrease Necrotic Tissue to: 70 (distal Lt buttock wound) Decrease Necrotic Tissue - Progress: Goal set today Increase Granulation Tissue to: 30 (distal Lt buttock wound) Increase Granulation Tissue - Progress: Goal set today Goals/treatment plan/discharge plan were made with and agreed upon by patient/family: Yes Time For Goal Achievement: 7 days Wound Therapy - Potential for Goals: Good  Goals will be updated until maximal potential achieved or discharge criteria met.  Discharge criteria: when goals achieved, discharge from hospital, MD decision/surgical intervention, no progress towards goals, refusal/missing three consecutive treatments without notification or medical reason.  GP     SASSER,LYNN 10/22/2013, 4:17 PM Pager [phone]

## 2013-10-22 NOTE — Progress Notes (Signed)
Occupational Therapy Weekly Progress Note  Patient Details  Name: Donald [name] MRN: [id number] Date of Birth: 1946-02-21  Beginning of progress report period: October 14, 2013 End of progress report period: October 22, 2013  Today's Date: 10/22/2013 Time: 1450-1500 Time Calculation (min): 10 min  Pt is currently making slow gains with therapy with regards to selfcare performance and functional transfers.  He needs mod to max assist to perform toilet transfers depending on direction.  He can transfer with mod assist squat pivot to the right and max assist squat pivot to the left.  He continues to use synergy patterns of extension to move as well as demonstrating severe flexor tone in the left arm and hand.  In addition, he continues to be limited by severe pain in his buttocks from skin issues.  Sit to stand is also at a mod assist level with pt only being able to maintain standing for 10-20 seconds before needing to sit down.  Donald [name] continues to need total assist for integrated use of the LUE as well during bathing and grooming tasks.  Feel overall his progress has been impacted secondary to his down mood and all of the physical issues he continues to have to deal with.  Unsure if wife will be able to provide assistance at current level or even min level for home.  May need SNF for further rehab post inpatient stay.  Patient continues to demonstrate the following deficits: decreased balance, decreased LUE function, decreased strength, decreased endurance, increased pain, and therefore will continue to benefit from skilled OT intervention to enhance overall performance with BADL.  Patient progressing toward long term goals..  Continue plan of care.  OT Short Term Goals Week 2:   LTGs equal to min assist to supervision for all selfcare and functional transfers  Skilled Therapeutic Interventions/Progress Updates:    Pt transferred from the wheelchair to the EOB with mod assist.  Pt missed 35  mins of OT this session as he was undergoing PT hydrotherapy.  Felt this would be more beneficial to him at this time secondary to his buttocks wound and pain have been severely limited.  Therapy Documentation Precautions:  Precautions Precautions: Fall Precaution Comments: LT AKA wrap, pt is anxious with mobility, history of Left hemiparesis and increased tone., buttocks wounds  Restrictions Weight Bearing Restrictions: No General: General Amount of Missed OT Time (min): 35 Minutes  Pain: Pain Assessment Pain Assessment: Faces Pain Score: 6  Faces Pain Scale: Hurts even more Pain Type: Acute pain Pain Location: Buttocks Pain Orientation: Right;Left Pain Intervention(s): Repositioned;Emotional support ADL: See FIM for current functional status  Therapy/Group: Individual Therapy  Donald [name] OTR/L 10/22/2013, 3:22 PM

## 2013-10-22 NOTE — Plan of Care (Signed)
Problem: RH SKIN INTEGRITY Goal: RH STG MAINTAIN SKIN INTEGRITY WITH ASSISTANCE STG Maintain Skin Integrity With Mod Assistance.  Outcome: Not Progressing Stage II to bilateral buttocks with some sloth skin. WOC nurse will perform hydrotherapy today. adm

## 2013-10-23 ENCOUNTER — Inpatient Hospital Stay (HOSPITAL_COMMUNITY): Payer: Self-pay | Admitting: Occupational Therapy

## 2013-10-23 ENCOUNTER — Inpatient Hospital Stay (HOSPITAL_COMMUNITY): Payer: Self-pay | Admitting: Rehabilitation

## 2013-10-23 ENCOUNTER — Encounter (HOSPITAL_COMMUNITY): Payer: Self-pay | Admitting: Occupational Therapy

## 2013-10-23 ENCOUNTER — Ambulatory Visit (HOSPITAL_COMMUNITY): Payer: Self-pay

## 2013-10-23 DIAGNOSIS — G8110 Spastic hemiplegia affecting unspecified side: Secondary | ICD-10-CM

## 2013-10-23 DIAGNOSIS — L98499 Non-pressure chronic ulcer of skin of other sites with unspecified severity: Secondary | ICD-10-CM

## 2013-10-23 DIAGNOSIS — E119 Type 2 diabetes mellitus without complications: Secondary | ICD-10-CM

## 2013-10-23 DIAGNOSIS — S78119A Complete traumatic amputation at level between unspecified hip and knee, initial encounter: Secondary | ICD-10-CM

## 2013-10-23 DIAGNOSIS — I739 Peripheral vascular disease, unspecified: Secondary | ICD-10-CM

## 2013-10-23 LAB — GLUCOSE, CAPILLARY
GLUCOSE-CAPILLARY: 159 mg/dL — AB (ref 70–99)
Glucose-Capillary: 147 mg/dL — ABNORMAL HIGH (ref 70–99)
Glucose-Capillary: 138 mg/dL — ABNORMAL HIGH (ref 70–99)
Glucose-Capillary: 180 mg/dL — ABNORMAL HIGH (ref 70–99)

## 2013-10-23 MED ORDER — HEPARIN SOD (PORK) LOCK FLUSH 100 UNIT/ML IV SOLN
500.0000 [IU] | INTRAVENOUS | Status: AC | PRN
  Administered 2013-10-23: 500 [IU]

## 2013-10-23 NOTE — Progress Notes (Signed)
Physical Therapy Wound Treatment Patient Details  Name: Donald [name] MRN: [id number] Date of Birth: 11-12-1945  Today's Date: 10/23/2013 Time: 0822-0900 Time Calculation (min): 38 min  Subjective  Patient and Family Stated Goals: decr pain and heal wound Date of Onset:  (unknown) Prior Treatments: "every kind of cream you can imagine"  Pain Score: Pain Score: more than 10/10, buttocks (despite pre-medication with max meds currently ordered)  Wound Assessment  Clinical Statement: Pt tolerated treatment slightly better than 6/25, however remains very painful with pt requesting debridement be held several times for him to rest. Spoke to RN and left note for MD to consider IV pain meds prior to hydrotherapy.  Wound / Incision (Open or Dehisced) 10/13/13 Other (Comment) Buttocks Left;Distal MSDA (Active)  Dressing Type Barrier Film (skin prep);Gauze (Comment);Moist to moist;Other (Comment);Foam; Santyl 10/23/2013  9:15 AM  Dressing Changed Changed 10/23/2013  9:15 AM  Dressing Status None;Clean;Dry;Intact 10/23/2013  9:15 AM  Dressing Change Frequency Daily 10/23/2013  9:15 AM  Site / Wound Assessment Painful;Yellow;Brown 10/23/2013  9:15 AM  % Wound base Red or Granulating 0% 10/23/2013  9:15 AM  % Wound base Yellow 90% 10/23/2013  9:15 AM  % Wound base Black 10% 10/23/2013  9:15 AM  % Wound base Other (Comment) 0% 10/23/2013  9:15 AM  Peri-wound Assessment Erythema (non-blanchable);Pink 10/23/2013  9:15 AM  Wound Length (cm) 7.8 cm 10/22/2013  4:01 PM  Wound Width (cm) 5.7 cm 10/22/2013  4:01 PM  Wound Depth (cm) 0.2 cm 10/13/2013  5:12 PM  Margins Unattached edges (unapproximated) 10/23/2013  9:15 AM  Closure None 10/23/2013  9:15 AM  Drainage Amount Scant 10/23/2013  9:15 AM  Drainage Description Serous 10/23/2013  9:15 AM  Non-staged Wound Description Partial thickness 10/23/2013  9:15 AM  Treatment Debridement (Selective);Hydrotherapy (Pulse lavage);Packing (Saline gauze) 10/23/2013  9:15 AM      Wound / Incision (Open or Dehisced) 10/13/13 Other (Comment) Buttocks Left;Proximal MSDA; includes 9 open areas proximal to large buttock wound (Active)  Dressing Type Barrier Film (skin prep);Gauze (Comment);Foam, Santyl 10/23/2013  9:15 AM  Dressing Changed Changed 10/23/2013  9:15 AM  Dressing Status Clean;Dry;Intact 10/23/2013  9:15 AM  Dressing Change Frequency Daily 10/23/2013  9:15 AM  Site / Wound Assessment Painful;Pink;Yellow 10/23/2013  9:15 AM  % Wound base Red or Granulating 10% 10/23/2013  9:15 AM  % Wound base Yellow 90% 10/23/2013  9:15 AM  % Wound base Black 0% 10/23/2013  9:15 AM  % Wound base Other (Comment) 0% 10/23/2013  9:15 AM  Peri-wound Assessment Erythema (non-blanchable);Maceration;Excoriated;Pink 10/23/2013  9:15 AM  Wound Length (cm) 6.5 cm 10/22/2013  4:01 PM  Wound Width (cm) 5.8 cm 10/22/2013  4:01 PM  Wound Depth (cm) 1 cm 10/13/2013  5:12 PM  Tunneling (cm) 0.2 10/13/2013  5:12 PM  Margins Unattached edges (unapproximated) 10/23/2013  9:15 AM  Closure None 10/23/2013  9:15 AM  Drainage Amount Scant 10/23/2013  9:15 AM  Drainage Description Serous 10/23/2013  9:15 AM  Non-staged Wound Description Partial thickness 10/23/2013  9:15 AM  Treatment Debridement (Selective);Hydrotherapy (Pulse lavage);Packing (Saline gauze) 10/23/2013  9:15 AM      Hydrotherapy Pulsed lavage therapy - wound location: Lt buttocks and sacrum Pulsed Lavage with Suction (psi): 4 psi ( to 8; required several breaks due to pain) Pulsed Lavage with Suction - Normal Saline Used: 1000 mL Pulsed Lavage Tip: Tip with splash shield Selective Debridement Selective Debridement - Location: distal Lt buttock wound Selective Debridement - Tools Used:  Forceps;Scalpel Selective Debridement - Tissue Removed: yellow, brown slough   Wound Assessment and Plan  Wound Therapy - Assess/Plan/Recommendations Wound Therapy - Clinical Statement: Pt tolerated treatment slightly better than 6/25, however remains very  painful with pt requesting debridement be held several times for him to rest. Spoke to RN and left note for MD to consider IV pain meds prior to hydrotherapy. Wound Therapy - Functional Problem List: limiting all mobility per PT & OT due to pain Factors Delaying/Impairing Wound Healing: Diabetes Mellitus;Immobility;Multiple medical problems Hydrotherapy Plan: Debridement;Dressing change;Patient/family education;Pulsatile lavage with suction Wound Therapy - Frequency: 6X / week Wound Therapy - Follow Up Recommendations: Home health RN Wound Plan: see above  Wound Therapy Goals- Improve the function of patient's integumentary system by progressing the wound(s) through the phases of wound healing (inflammation - proliferation - remodeling) by: Decrease Necrotic Tissue to: 70 (distal Lt buttock wound) Decrease Necrotic Tissue - Progress: Progressing toward goal Increase Granulation Tissue to: 30 (distal Lt buttock wound) Increase Granulation Tissue - Progress: Progressing toward goal  Goals will be updated until maximal potential achieved or discharge criteria met.  Discharge criteria: when goals achieved, discharge from hospital, MD decision/surgical intervention, no progress towards goals, refusal/missing three consecutive treatments without notification or medical reason.  GP     SASSER,LYNN 10/23/2013, 11:58 AM Pager [phone]

## 2013-10-23 NOTE — Progress Notes (Signed)
Physical Therapy Session Note  Patient Details  Name: Donald [name] MRN: [id number] Date of Birth: 1946-02-04  Today's Date: 10/23/2013 Time: 1300-1415 Time Calculation (min): 75 min  Short Term Goals: Week 2:  PT Short Term Goal 1 (Week 2): Pt will perform bed mobility with S with HOB flat and without bed rails PT Short Term Goal 2 (Week 2): Pt will perform dynamic standing x 3 mins at min A level with RUE supported PT Short Term Goal 3 (Week 2): Pt will self propel w/c x 150' using R hemi technique at S level PT Short Term Goal 4 (Week 2): Pt will verbalize pressure relief techniques with min cues PT Short Term Goal 5 (Week 2): Pt will perform stand/squat pivot transfer with min A.   Skilled Therapeutic Interventions/Progress Updates:   Pt received sitting in w/c in room, pt notably fatigued from pain medicine received earlier in day.  Had lengthy discussion with pt and wife regarding D/C plans and pts current level of care and impact on wife's ability to care for him.  Pt could not recall speaking with Jacqlyn Larsen, CSW earlier in day about D/C plans and states "she talked to me about eating."  Discussed again with pt and wife and then discussed wanting wife to attempt to stand with pt with PT assisting in order to see how much assist he is currently requiring.  Pts wife in agreement therefore assisted pt in front of sink for RUE support, however pt unable to stand with +2 assist and then continued to verbalize to wife that she would be unable to care for him at this time, even with stay being extended.  Assisted pt to therapy gym via w/c in order to perform squat pivot transfers w/c to/from mat with max A.  Performed R hamstring stretch x 2 reps of 3 mins.  Then performed knee presses x 10 reps and SLR x 10 reps.  Transferred back to w/c and pt self propelled x 100' towards room at S level, max cues to continue with task.  Upon assisting pt back to bed, noted pt incontinent of bowel and also note  that bowel had soiled wound dressing, therefore assisted with removal, and cleaning of wound with RN then present in room.  Pt left in room with RN to change dressing.   Therapy Documentation Precautions:  Precautions Precautions: Fall Precaution Comments: LT AKA wrap, pt is anxious with mobility, history of Left hemiparesis and increased tone., buttocks wounds  Restrictions Weight Bearing Restrictions: No   Vital Signs: Therapy Vitals Temp: 98.2 F (36.8 C) Temp src: Oral Pulse Rate: 100 Resp: 18 BP: 142/80 mmHg Patient Position (if appropriate): Lying Oxygen Therapy SpO2: 97 % O2 Device: None (Room air) Pain: max c/o pain in buttocks, already very lethargic from earlier pain meds.    Locomotion : Wheelchair Mobility Distance: 100   See FIM for current functional status  Therapy/Group: Individual Therapy  Denice Bors 10/23/2013, 4:02 PM

## 2013-10-23 NOTE — Progress Notes (Signed)
Subjective/Complaints: 68 y.o. right-handed male with history of CVA with left-sided weakness, HTN, PVD with multiple revascularization procedures as well his recent thrombectomy of occluded FPBG with 4 compartment fasciotomy requiring VAC 5/ 2015. He was d/c from CIR on 09/24/13 and was ambulating at supervision level with RW. He started developing increasing LLE pain with onset of numbness 10/07/13 due to recurrent thrombosis left femoral popliteal bypass. Patient has refused chronic coumadin therapy as well as removal of infected L-axillofemoral BG. High L-AKA recommended due to level of demarcation. On 10/08/13, patient underwent L-AKA by Dr. Trula Slade and post op with severe depressive symptoms with lability as well as poor pain control. He was started on Oxycontin for more pain management as well as Toradol. Was treated with IV iron infusion for anemia. Pschiatry consulted for input and patient started on Cymbalta for severe acute depressive symptoms  Now on Right side!, cool with larger fan and regular bed Appreciate psych note Review of Systems - Negative except Left buttocks pain  Objective: Vital Signs: Blood pressure 123/59, pulse 78, temperature 97.7 F (36.5 C), temperature source Oral, resp. rate 18, weight 75.137 kg (165 lb 10.4 oz), SpO2 96.00%. No results found. Results for orders placed during the hospital encounter of 10/13/13 (from the past 72 hour(s))  GLUCOSE, CAPILLARY     Status: Abnormal   Collection Time    10/20/13 11:56 AM      Result Value Ref Range   Glucose-Capillary 142 (*) 70 - 99 mg/dL   Comment 1 Notify RN    GLUCOSE, CAPILLARY     Status: Abnormal   Collection Time    10/20/13  4:15 PM      Result Value Ref Range   Glucose-Capillary 167 (*) 70 - 99 mg/dL  GLUCOSE, CAPILLARY     Status: Abnormal   Collection Time    10/20/13  9:05 PM      Result Value Ref Range   Glucose-Capillary 198 (*) 70 - 99 mg/dL  GLUCOSE, CAPILLARY     Status: Abnormal   Collection Time    10/21/13  7:07 AM      Result Value Ref Range   Glucose-Capillary 170 (*) 70 - 99 mg/dL   Comment 1 Notify RN    GLUCOSE, CAPILLARY     Status: Abnormal   Collection Time    10/21/13 11:49 AM      Result Value Ref Range   Glucose-Capillary 168 (*) 70 - 99 mg/dL   Comment 1 Notify RN    GLUCOSE, CAPILLARY     Status: Abnormal   Collection Time    10/21/13  4:35 PM      Result Value Ref Range   Glucose-Capillary 176 (*) 70 - 99 mg/dL   Comment 1 Notify RN    GLUCOSE, CAPILLARY     Status: Abnormal   Collection Time    10/21/13  8:57 PM      Result Value Ref Range   Glucose-Capillary 175 (*) 70 - 99 mg/dL  GLUCOSE, CAPILLARY     Status: Abnormal   Collection Time    10/22/13  7:20 AM      Result Value Ref Range   Glucose-Capillary 138 (*) 70 - 99 mg/dL  GLUCOSE, CAPILLARY     Status: Abnormal   Collection Time    10/22/13 11:34 AM      Result Value Ref Range   Glucose-Capillary 198 (*) 70 - 99 mg/dL   Comment 1 Notify RN  GLUCOSE, CAPILLARY     Status: Abnormal   Collection Time    10/22/13  4:22 PM      Result Value Ref Range   Glucose-Capillary 161 (*) 70 - 99 mg/dL   Comment 1 Notify RN    GLUCOSE, CAPILLARY     Status: Abnormal   Collection Time    10/22/13  9:11 PM      Result Value Ref Range   Glucose-Capillary 166 (*) 70 - 99 mg/dL     HEENT: poor oral hygiene, left facial weakness Cardio: RRR and no  murmur Resp: CTA B/L and unlabored GI: BS positive and NT, ND Extremity:  Pulses positive and No Edema Skin:  No satellite lesions,, left butocks with Grade 2 decub, clean with eschar surrounded by granulation tissue Neuro: Alert/Oriented, Abnormal Motor 2-/5 R delt bi tri grip, 2- L HF, RIght side normal strength, Tone:  Increased Reduced LT sensation R foot Flexor tone LUE and Dysarthric Musc/Skel:  Other left AKA, now with occlusive dressing L AKA site Gen NAD Ext R foot warm, intrinsic muscle atrophy,no lesions  Assessment/Plan: 1.  Functional deficits secondary to Left AKA /PAD which require 3+ hours per day of interdisciplinary therapy in a comprehensive inpatient rehab setting. Physiatrist is providing close team supervision and 24 hour management of active medical problems listed below. Physiatrist and rehab team continue to assess barriers to discharge/monitor patient progress toward functional and medical goals. FIM: FIM - Bathing Bathing Steps Patient Completed: Chest;Left Arm;Abdomen;Front perineal area;Right upper leg;Left upper leg Bathing: 4: Min-Patient completes 8-9 71f 10 parts or 75+ percent  FIM - Upper Body Dressing/Undressing Upper body dressing/undressing steps patient completed: Thread/unthread right sleeve of pullover shirt/dresss;Thread/unthread left sleeve of pullover shirt/dress;Put head through opening of pull over shirt/dress;Pull shirt over trunk Upper body dressing/undressing: 0: Wears gown/pajamas-no public clothing FIM - Lower Body Dressing/Undressing Lower body dressing/undressing steps patient completed: Don/Doff right shoe Lower body dressing/undressing: 1: Total-Patient completed less than 25% of tasks  FIM - Toileting Toileting steps completed by patient: Performs perineal hygiene Toileting: 1: Two helpers  FIM - Radio producer Devices: Recruitment consultant Transfers: 3-To toilet/BSC: Mod A (lift or lower assist);3-From toilet/BSC: Mod A (lift or lower assist)  FIM - Bed/Chair Transfer Bed/Chair Transfer Assistive Devices: Arm rests Bed/Chair Transfer: 4: Supine > Sit: Min A (steadying Pt. > 75%/lift 1 leg);3: Bed > Chair or W/C: Mod A (lift or lower assist);3: Chair or W/C > Bed: Mod A (lift or lower assist)  FIM - Locomotion: Wheelchair Distance: 150 Locomotion: Wheelchair: 5: Travels 150 ft or more: maneuvers on rugs and over door sills with supervision, cueing or coaxing FIM - Locomotion: Ambulation Locomotion: Ambulation Assistive Devices:  Engineer, agricultural Ambulation/Gait Assistance: 1: +2 Total assist (Mod w/ +2 for chair follow) Locomotion: Ambulation: 0: Activity did not occur  Comprehension Comprehension Mode: Auditory Comprehension: 5-Understands basic 90% of the time/requires cueing < 10% of the time  Expression Expression Mode: Verbal Expression: 5-Expresses complex 90% of the time/cues < 10% of the time  Social Interaction Social Interaction: 5-Interacts appropriately 90% of the time - Needs monitoring or encouragement for participation or interaction.  Problem Solving Problem Solving: 5-Solves complex 90% of the time/cues < 10% of the time  Memory Memory: 6-More than reasonable amt of time  Medical Problem List and Plan:  1. 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 DVT Prophylaxis/Anticoagulation: Pharmaceutical:  Lovenox  3. Pain Management: On oxycontin  50mg  bid, reduce to 40mg  due to lethargy. Oxycodone 10 mg prn breakthrough pain.  4. Depressed Mood: continue cymbalta.   -still a major issue  5. Neuropsych: This patient is capable of making decisions on his own behalf.  6. HTN: Monitor every 8 hours. Lisinopril resumed today.  7. H/o rectal cancer: Has problems with rectal pain when constipated. Last BM 6/20, adjust meds 8. Chronic constipation: Will increase Senna S to 2 pills bid. Suppository every 24 hours if no BM.  9. 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 Continue Lantus 5 units and will titrate to home as indicated 10. Iron deficiency anemia: Loaded with IV iron. Continue po supplement. Check orthostatics, stool for OB, consider transfusion if symptomatic 11. H/o CVA with left spastic hemiparesis: Continue aggrenox.  12. Skin: Fungal infection clearing , Right buttocks improving but Left buttocks is not, because of Left hemiplegia, pt tends to lay on Left side, will Ask nsg to position pt on R side Added  diflucan, continue local care per WOC recs. Air mattress  - WOC following  -needs to be OOB as much as possible given sacral wounds, All meals in chair   LOS (Days) [place] E 10/23/2013, 7:44 AM

## 2013-10-23 NOTE — Progress Notes (Signed)
Occupational Therapy Session Note  Patient Details  Name: Donald [name] MRN: [id number] Date of Birth: May 02, 1945  Today's Date: 10/23/2013 Time: 1021-1103 Time Calculation (min): 42 min  Short Term Goals: Week 2:  OT Short Term Goal 1 (Week 2): LTGs equal to min assist level to supervision.  Skilled Therapeutic Interventions/Progress Updates:   Pt performed bathing and dressing sit to stand at the sink.  He needed max cueing to arouse as well as max assist to transition supine to sit EOB.  Mod assist for sitting balance with pt exhibiting a right side posterior lean.  Pt noted to be more lethargic than previous sessions.  Feel it is likely due to increased pain meds secondary to having hydrotherapy earlier this morning.  Max assist needed for squat pivot transfer to the right to wheelchair.  Pt performed UB bathing with min assist and and UB dressing with mod assist this session.  Did not attempt sit to stand as pt was cleaned this morning prior to session.  He did need min assist for washing his right foot and max assist for donning and doffing his right sock.  He was able to donn his velcro shoe with supervision and use of step stool to prop on.    Second Session: 14:38-15:32 Pt transferred supine to sit EOB with mod assist.  Worked on changing shirt and donning shorts as pt with incontinent episode.  He was able to donn his pullover shirt with min assist follow hemi techniques.  Therapist assisted with donning shorts over his LEs and then he needed total assist +2 (pt 30%) for sit to stand and standing balance to pull them up over his hips.  Pt with increased lean to the right and use of trunk extension during all movements.  Transitioned down to the gym to work on transitional movements with emphasis on forward trunk flexion to assist with transfers and sit to stand.  Pt needs max assist for scooting EOM.  Also worked on getting pt to weightshift to the left in sitting as pt is unable to  relax his right side and tenses up.  Pt needing max assist to rest on therapist on the left side with weightbearing over the left arm.  Finished session with transfer from mat to wheelchair with max assist and then back to the room.  Wife present during session and was able to observe and voice understanding that pt is not at a level to safely go home at this time.     Therapy Documentation Precautions:  Precautions Precautions: Fall Precaution Comments: LT AKA wrap, pt is anxious with mobility, history of Left hemiparesis and increased tone., buttocks wounds  Restrictions Weight Bearing Restrictions: No  Pain: Pain Assessment Pain Assessment: Faces Faces Pain Scale: Hurts little more Pain Type: Acute pain Pain Location: Buttocks Pain Intervention(s): Medication (See eMAR) ADL: ADL ADL Comments: see FIM   See FIM for current functional status  Therapy/Group: Individual Therapy  MCGUIRE,JAMES OTR/L 10/23/2013, 1:00 PM

## 2013-10-23 NOTE — Progress Notes (Signed)
Social Work Patient ID: Donald [name], male   DOB: 04/10/1946, 68 y.o.   MRN: [id number] Met with pt and wife to discuss the discharge plan, the concerns of team and wife's ability of providing the care he needs.  Wife really wants to try and take pt home and Provide the care.  Pt was somewhat lethargic today, was told took more pain medicines for hydrotherapy today.  Wife is concerned he is not eating and will not heal or regain His strength if he doesn't eat.  Pt reports: "There are so many thing I need to do."   Encouraged wife to stay and attend therapies with pt and see how therapists transfer him and  Assist him.  Both are aware of the alternative of hiring assistance or short term NHP.  Discussed even with an extension of his length of stay he will require much care at home. Both did not say anything and want to see how it goes in the next few days.  Wife plans to be here and attend therapies with him to see how the therapists move him.  Will work on a safe  Discharge plan.  This will be a process in which daily interaction is needed to come up with the best plan for them.

## 2013-10-24 ENCOUNTER — Ambulatory Visit (HOSPITAL_COMMUNITY): Payer: Self-pay

## 2013-10-24 ENCOUNTER — Inpatient Hospital Stay (HOSPITAL_COMMUNITY): Payer: Medicare Other | Admitting: *Deleted

## 2013-10-24 DIAGNOSIS — D509 Iron deficiency anemia, unspecified: Secondary | ICD-10-CM

## 2013-10-24 DIAGNOSIS — E1351 Other specified diabetes mellitus with diabetic peripheral angiopathy without gangrene: Secondary | ICD-10-CM

## 2013-10-24 DIAGNOSIS — R269 Unspecified abnormalities of gait and mobility: Secondary | ICD-10-CM

## 2013-10-24 LAB — GLUCOSE, CAPILLARY
GLUCOSE-CAPILLARY: 136 mg/dL — AB (ref 70–99)
Glucose-Capillary: 156 mg/dL — ABNORMAL HIGH (ref 70–99)
Glucose-Capillary: 265 mg/dL — ABNORMAL HIGH (ref 70–99)
Glucose-Capillary: 156 mg/dL — ABNORMAL HIGH (ref 70–99)

## 2013-10-24 MED ORDER — MORPHINE SULFATE 2 MG/ML IJ SOLN
2.0000 mg | Freq: Two times a day (BID) | INTRAMUSCULAR | Status: DC | PRN
  Filled 2013-10-24: qty 1

## 2013-10-24 MED ORDER — MORPHINE SULFATE 2 MG/ML IJ SOLN
2.0000 mg | Freq: Two times a day (BID) | INTRAMUSCULAR | Status: AC | PRN
  Administered 2013-10-24: 2 mg via INTRAMUSCULAR

## 2013-10-24 NOTE — Progress Notes (Signed)
Donald [name] is a 68 y.o. male 1946-03-17 [id number]  Subjective: No new complaints. No new problems. Slept well. Feeling OK. Per Wound Care RN - he needs IV pain meds prior to wound care  Objective: Vital signs in last 24 hours: Temp:  [97.4 F (36.3 C)-98.2 F (36.8 C)] 97.5 F (36.4 C) (06/27 0618) Pulse Rate:  [100-125] 102 (06/27 0618) Resp:  [18-19] 19 (06/27 0618) BP: (142-182)/(65-80) 182/65 mmHg (06/27 0618) SpO2:  [97 %-100 %] 100 % (06/27 0618) Weight change:  Last BM Date: 10/23/13  Intake/Output from previous day: 06/26 0701 - 06/27 0700 In: 730 [P.O.:720; I.V.:10] Out: 250 [Urine:250] Last cbgs: CBG (last 3)   Recent Labs  10/23/13 1629 10/23/13 2046 10/24/13 0702  GLUCAP 138* 180* 156*     Physical Exam General: No apparent distress   HEENT: not dry Lungs: Normal effort. Lungs clear to auscultation, no crackles or wheezes. Cardiovascular: Regular rate and rhythm, no edema Abdomen: S/NT/ND; BS(+) Musculoskeletal:  unchanged Neurological: No new neurological deficits Wounds: dressed Skin: clear  Aging changes Mental state: Alert, oriented, cooperative    Lab Results: BMET    Component Value Date/Time   NA 139 10/20/2013 0514   NA 137 06/05/2013 1010   NA 147* 10/12/2011 0959   K 4.3 10/20/2013 0514   K 5.4* 06/05/2013 1010   K 5.2* 10/12/2011 0959   CL 98 10/20/2013 0514   CL 102 05/19/2012 1427   CL 102 10/12/2011 0959   CO2 26 10/20/2013 0514   CO2 23 06/05/2013 1010   CO2 29 10/12/2011 0959   GLUCOSE 144* 10/20/2013 0514   GLUCOSE 239* 06/05/2013 1010   GLUCOSE 205* 05/19/2012 1427   GLUCOSE 244* 10/12/2011 0959   BUN 19 10/20/2013 0514   BUN 19.5 06/05/2013 1010   BUN 13 10/12/2011 0959   CREATININE 0.82 10/20/2013 0514   CREATININE 1.4* 06/05/2013 1010   CREATININE 1.0 10/12/2011 0959   CALCIUM 8.9 10/20/2013 0514   CALCIUM 10.1 06/05/2013 1010   CALCIUM 9.0 10/12/2011 0959   GFRNONAA 88* 10/20/2013 0514   GFRAA >90 10/20/2013 0514   CBC     Component Value Date/Time   WBC 12.0* 10/17/2013 0425   WBC 7.1 06/05/2013 1010   RBC 3.80* 10/17/2013 0425   RBC 4.23 06/05/2013 1010   RBC 3.54* 01/26/2012 0455   HGB 10.0* 10/17/2013 0425   HGB 9.3* 06/05/2013 1010   HCT 31.3* 10/17/2013 0425   HCT 30.8* 06/05/2013 1010   PLT 379 10/17/2013 0425   PLT 324 06/05/2013 1010   MCV 82.4 10/17/2013 0425   MCV 72.9* 06/05/2013 1010   MCH 26.3 10/17/2013 0425   MCH 22.0* 06/05/2013 1010   MCHC 31.9 10/17/2013 0425   MCHC 30.2* 06/05/2013 1010   RDW 21.3* 10/17/2013 0425   RDW 16.9* 06/05/2013 1010   LYMPHSABS 1.1 10/17/2013 0425   LYMPHSABS 1.6 06/05/2013 1010   MONOABS 0.8 10/17/2013 0425   MONOABS 0.5 06/05/2013 1010   EOSABS 0.0 10/17/2013 0425   EOSABS 0.1 06/05/2013 1010   BASOSABS 0.0 10/17/2013 0425   BASOSABS 0.1 06/05/2013 1010    Studies/Results: No results found.  Medications: I have reviewed the patient's current medications.  Assessment/Plan:  1. 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 DVT Prophylaxis/Anticoagulation: Pharmaceutical: Lovenox  3. Pain Management: On oxycontin 50mg  bid, reduce to 40mg  due to lethargy. Oxycodone 10 mg prn breakthrough pain.  4. Depressed Mood: continue cymbalta.  -still a major issue  5. Neuropsych: This patient is capable of making decisions on his own behalf.  6. HTN: Monitor every 8 hours. Lisinopril resumed today.  7. H/o rectal cancer: Has problems with rectal pain when constipated. Last BM 6/20, adjust meds  8. Chronic constipation: Will increase Senna S to 2 pills bid. Suppository every 24 hours if no BM.  9. 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 Continue Lantus 5 units and will titrate to home as indicated  10. Iron deficiency anemia: Loaded with IV iron. Continue po supplement. Check orthostatics, stool for OB, consider transfusion if symptomatic  11. H/o CVA with left spastic hemiparesis: Continue aggrenox.  12.  Skin: Fungal infection clearing , Right buttocks improving but Left buttocks is not, because of Left hemiplegia, pt tends to lay on Left side, will Ask nsg to position pt on R side Added diflucan, continue local care per WOC recs. Air mattress  - WOC following  -needs to be OOB as much as possible given sacral wounds, All meals in chair 13.  Per Wound Care RN - he needs IV pain meds prior to wound care: IV MS      Length of stay, days: 11  Walker Kehr , MD 10/24/2013, 9:32 AM

## 2013-10-24 NOTE — Progress Notes (Signed)
Occupational Therapy Session Note  Patient Details  Name: TARRIN LEBOW MRN: [id number] Date of Birth: [birth year]  Today's Date: 10/24/2013 Time:1615-1700  Time Calculation (min): 45 min  Short Term Goals: Week 1:  OT Short Term Goal 1 (Week 1): Short Term Goals = Long Term Goals due to ELOS  Skilled Therapeutic Interventions/Progress Updates:    Pt. Lying in bed upon OT arrival.  He reported he had a rough day.  Agreed to do UE NMRE, therapeutic exercises in bed.  Performed mobilization to LUE with myotherapy; gentle deep tissue massage.  Pt. tolerated about 15 minutes of stretching.  Performed AROM with resistance to RUE.  Had some mild decubitus in shoulder.  Pt reported he always feels tense when he is in therapy.  He reported feeling more relaxed after session.  Sister present during session and observed.    Therapy Documentation Precautions:  Precautions Precautions: Fall Precaution Comments: LT AKA wrap, pt is anxious with mobility, history of Left hemiparesis and increased tone., buttocks wounds  Restrictions Weight Bearing Restrictions: No     Pain: Pain Assessment Pain Score: 4 Pain Location: Buttocks Pain Intervention(s): Medication (See eMAR) ADL: ADL ADL Comments: see FIM      See FIM for current functional status  Therapy/Group: Individual Therapy  Lisa Roca 10/24/2013, 4:28 PM

## 2013-10-24 NOTE — Progress Notes (Signed)
Physical Therapy Wound Treatment Patient Details  Name: Donald [name] MRN: [id number] Date of Birth: [birth year]  Today's Date: 10/24/2013 Time: 2500-3704 Time Calculation (min): 42 min  Subjective  Subjective: "The nurse gave me different medicine today."  Pain Score: Pain Score: 2   Wound Assessment  Negative Pressure Wound Therapy Leg Left;Anterior (Active)     Wound / Incision (Open or Dehisced) 10/13/13 Other (Comment) Buttocks Left;Distal MSDA (Active)  Dressing Type Barrier Film (skin prep);Gauze (Comment);Moist to moist;Other (Comment);Foam 10/24/2013  3:00 PM  Dressing Changed Changed 10/24/2013  3:00 PM  Dressing Status Clean;Dry;Intact 10/24/2013  3:00 PM  Dressing Change Frequency Daily 10/24/2013  3:00 PM  Site / Wound Assessment Painful;Yellow;Brown 10/24/2013  3:00 PM  % Wound base Red or Granulating 0% 10/24/2013  3:00 PM  % Wound base Yellow 90% 10/24/2013  3:00 PM  % Wound base Black 10% 10/24/2013  3:00 PM  % Wound base Other (Comment) 0% 10/24/2013  3:00 PM  Peri-wound Assessment Erythema (non-blanchable);Pink 10/24/2013  3:00 PM  Wound Length (cm) 7.8 cm 10/22/2013  4:01 PM  Wound Width (cm) 5.7 cm 10/22/2013  4:01 PM  Wound Depth (cm) 0.2 cm 10/13/2013  5:12 PM  Margins Unattached edges (unapproximated) 10/24/2013  3:00 PM  Closure None 10/24/2013  3:00 PM  Drainage Amount Scant 10/24/2013  3:00 PM  Drainage Description Serous 10/24/2013  3:00 PM  Non-staged Wound Description Partial thickness 10/24/2013  3:00 PM  Treatment Debridement (Selective);Hydrotherapy (Pulse lavage);Packing (Saline gauze) 10/24/2013  3:00 PM     Wound / Incision (Open or Dehisced) 10/13/13 Other (Comment) Buttocks Left;Proximal MSDA; includes 9 open areas proximal to large buttock wound (Active)  Dressing Type Barrier Film (skin prep);Gauze (Comment);Foam 10/24/2013  3:00 PM  Dressing Changed Changed 10/24/2013  3:00 PM  Dressing Status Clean;Dry;Intact 10/24/2013  3:00 PM  Dressing Change  Frequency Daily 10/24/2013  3:00 PM  Site / Wound Assessment Painful;Pink;Yellow 10/24/2013  3:00 PM  % Wound base Red or Granulating 10% 10/24/2013  3:00 PM  % Wound base Yellow 90% 10/24/2013  3:00 PM  % Wound base Black 0% 10/24/2013  3:00 PM  % Wound base Other (Comment) 0% 10/24/2013  3:00 PM  Peri-wound Assessment Erythema (non-blanchable);Maceration;Excoriated;Pink 10/24/2013  3:00 PM  Wound Length (cm) 6.5 cm 10/22/2013  4:01 PM  Wound Width (cm) 5.8 cm 10/22/2013  4:01 PM  Wound Depth (cm) 1 cm 10/13/2013  5:12 PM  Tunneling (cm) 0.2 10/13/2013  5:12 PM  Margins Unattached edges (unapproximated) 10/24/2013  3:00 PM  Closure None 10/24/2013  3:00 PM  Drainage Amount Scant 10/24/2013  3:00 PM  Drainage Description Serous 10/24/2013  3:00 PM  Non-staged Wound Description Partial thickness 10/24/2013  3:00 PM  Treatment Debridement (Selective);Hydrotherapy (Pulse lavage);Packing (Saline gauze) 10/24/2013  3:00 PM     Incision (Closed) 10/08/13 Thigh Left (Active)  Dressing Type Silver hydrofiber 10/23/2013  8:45 PM  Dressing Clean;Dry;Intact 10/23/2013  8:45 PM  Dressing Change Frequency Every 5 days 10/23/2013  8:45 PM  Site / Wound Assessment Clean;Dry 10/11/2013  8:20 PM  Margins Attached edges (approximated) 10/23/2013  8:45 PM  Closure Staples 10/23/2013  8:45 PM  Drainage Amount Minimal 10/22/2013  8:45 AM  Drainage Description Purulent 10/22/2013  8:45 AM  Treatment Cleansed;Other (Comment) 10/20/2013  8:30 AM   Hydrotherapy Pulsed lavage therapy - wound location: Lt buttocks and sacrum Pulsed Lavage with Suction (psi): 4 psi (4-8) Pulsed Lavage with Suction - Normal Saline Used: 1000 mL Pulsed Lavage Tip: Tip  with splash shield Selective Debridement Selective Debridement - Location: Lt buttock wounds Selective Debridement - Tools Used: Forceps;Scalpel Selective Debridement - Tissue Removed: yellow, brown slough   Wound Assessment and Plan  Wound Therapy - Assess/Plan/Recommendations Wound  Therapy - Clinical Statement: pt with different pain meds today allowing pt to tolerate hydro and debridement much better.   Wound Therapy - Functional Problem List: limiting all mobility per PT & OT due to pain Factors Delaying/Impairing Wound Healing: Diabetes Mellitus;Immobility;Multiple medical problems Hydrotherapy Plan: Debridement;Dressing change;Patient/family education;Pulsatile lavage with suction Wound Therapy - Frequency: 6X / week Wound Therapy - Follow Up Recommendations: Home health RN Wound Plan: see above  Wound Therapy Goals- Improve the function of patient's integumentary system by progressing the wound(s) through the phases of wound healing (inflammation - proliferation - remodeling) by: Decrease Necrotic Tissue to: 70 (distal Lt buttock wound) Decrease Necrotic Tissue - Progress: Progressing toward goal Increase Granulation Tissue to: 30 (distal Lt buttock wound) Increase Granulation Tissue - Progress: Progressing toward goal  Goals will be updated until maximal potential achieved or discharge criteria met.  Discharge criteria: when goals achieved, discharge from hospital, MD decision/surgical intervention, no progress towards goals, refusal/missing three consecutive treatments without notification or medical reason.  GP     [place], Thornton Papas, [place] 10/24/2013, 3:18 PM

## 2013-10-25 ENCOUNTER — Inpatient Hospital Stay (HOSPITAL_COMMUNITY): Payer: Self-pay | Admitting: Physical Therapy

## 2013-10-25 DIAGNOSIS — K5909 Other constipation: Secondary | ICD-10-CM

## 2013-10-25 DIAGNOSIS — I10 Essential (primary) hypertension: Secondary | ICD-10-CM

## 2013-10-25 DIAGNOSIS — E1159 Type 2 diabetes mellitus with other circulatory complications: Secondary | ICD-10-CM

## 2013-10-25 LAB — GLUCOSE, CAPILLARY
Glucose-Capillary: 137 mg/dL — ABNORMAL HIGH (ref 70–99)
Glucose-Capillary: 206 mg/dL — ABNORMAL HIGH (ref 70–99)
Glucose-Capillary: 227 mg/dL — ABNORMAL HIGH (ref 70–99)
Glucose-Capillary: 128 mg/dL — ABNORMAL HIGH (ref 70–99)

## 2013-10-25 NOTE — Progress Notes (Signed)
Physical Therapy Session Note  Patient Details  Name: Donald [name] MRN: [id number] Date of Birth: 25-Mar-1946  Today's Date: 10/25/2013 Time: 0805-0905 Time Calculation (min): 60 min  Short Term Goals: Week 2:  PT Short Term Goal 1 (Week 2): Pt will perform bed mobility with S with HOB flat and without bed rails PT Short Term Goal 2 (Week 2): Pt will perform dynamic standing x 3 mins at min A level with RUE supported PT Short Term Goal 3 (Week 2): Pt will self propel w/c x 150' using R hemi technique at S level PT Short Term Goal 4 (Week 2): Pt will verbalize pressure relief techniques with min cues PT Short Term Goal 5 (Week 2): Pt will perform stand/squat pivot transfer with min A.   Skilled Therapeutic Interventions/Progress Updates:    Pt supine amenable to treatment. Pt may benefit from continued mirror therapy to assist in LLE pain. Pt limited by decreased anterior weight shift in session in transfers. Pt with some improved ROM into anterior flexion with ventilatory cues but unable to carry it over into function. Pt would continue to benefit from skilled PT services to increase functional mobility. Therapy Documentation Precautions:  Precautions Precautions: Fall Precaution Comments: LT AKA wrap, pt is anxious with mobility, history of Left hemiparesis and increased tone., buttocks wounds  Restrictions Weight Bearing Restrictions: No Pain:  Pt reports pre and post 7/10 pain in LLE including phantom pain. Intervention: Mirror therapy with BLE Mobility:  Pt Max A for stand pivot transfers and mod A for bed mobility with cues for weight shift, hand placement, and technique. Balance:  Min A EOB and stand by assist at [facility]. Pt MinGuard for anterior weight shifting. Other Treatments:  Pt performs anterior weight shifting 2x10 with ventilatory cues. Pt educated on ace wrapping of LLE, particularly anchoring around waist to secure wrap. Pt performs LAQs B/L (using imagery on L) in w/c  with mirror 2x10. Pt performs LLE hip flexion and hip abd 2x10. Pt educated on pain science. Pt transferred BIB at end of session secondary to soiling, RN made aware.  See FIM for current functional status  Therapy/Group: Individual Therapy  Monia Pouch 10/25/2013, 9:32 AM

## 2013-10-25 NOTE — Progress Notes (Signed)
Donald [name] is a 68 y.o. male Oct 05, 1945 [id number]  Subjective: No new complaints. No new problems. Slept well.   ( Per Wound Care RN - he needs IV pain meds prior to wound care)  Objective: Vital signs in last 24 hours: Temp:  [96.7 F (35.9 C)-97.8 F (36.6 C)] 97.8 F (36.6 C) (06/28 0525) Pulse Rate:  [88-99] 97 (06/28 0525) Resp:  [17-18] 18 (06/28 0525) BP: (113-159)/(63-71) 151/66 mmHg (06/28 0525) SpO2:  [92 %-94 %] 94 % (06/28 0525) Weight change:  Last BM Date: 10/23/13  Intake/Output from previous day: 06/27 0701 - 06/28 0700 In: 480 [P.O.:480] Out: 450 [Urine:450] Last cbgs: CBG (last 3)   Recent Labs  10/24/13 1627 10/24/13 2104 10/25/13 0711  GLUCAP 136* 156* 137*     Physical Exam General: No apparent distress   HEENT: not dry Lungs: Normal effort. Lungs clear to auscultation, no crackles or wheezes. Cardiovascular: Regular rate and rhythm, no edema Abdomen: S/NT/ND; BS(+) Musculoskeletal:  unchanged Neurological: No new neurological deficits Wounds: dressed Skin: clear  Aging changes Mental state: Alert, oriented, cooperative    Lab Results: BMET    Component Value Date/Time   NA 139 10/20/2013 0514   NA 137 06/05/2013 1010   NA 147* 10/12/2011 0959   K 4.3 10/20/2013 0514   K 5.4* 06/05/2013 1010   K 5.2* 10/12/2011 0959   CL 98 10/20/2013 0514   CL 102 05/19/2012 1427   CL 102 10/12/2011 0959   CO2 26 10/20/2013 0514   CO2 23 06/05/2013 1010   CO2 29 10/12/2011 0959   GLUCOSE 144* 10/20/2013 0514   GLUCOSE 239* 06/05/2013 1010   GLUCOSE 205* 05/19/2012 1427   GLUCOSE 244* 10/12/2011 0959   BUN 19 10/20/2013 0514   BUN 19.5 06/05/2013 1010   BUN 13 10/12/2011 0959   CREATININE 0.82 10/20/2013 0514   CREATININE 1.4* 06/05/2013 1010   CREATININE 1.0 10/12/2011 0959   CALCIUM 8.9 10/20/2013 0514   CALCIUM 10.1 06/05/2013 1010   CALCIUM 9.0 10/12/2011 0959   GFRNONAA 88* 10/20/2013 0514   GFRAA >90 10/20/2013 0514   CBC    Component Value  Date/Time   WBC 12.0* 10/17/2013 0425   WBC 7.1 06/05/2013 1010   RBC 3.80* 10/17/2013 0425   RBC 4.23 06/05/2013 1010   RBC 3.54* 01/26/2012 0455   HGB 10.0* 10/17/2013 0425   HGB 9.3* 06/05/2013 1010   HCT 31.3* 10/17/2013 0425   HCT 30.8* 06/05/2013 1010   PLT 379 10/17/2013 0425   PLT 324 06/05/2013 1010   MCV 82.4 10/17/2013 0425   MCV 72.9* 06/05/2013 1010   MCH 26.3 10/17/2013 0425   MCH 22.0* 06/05/2013 1010   MCHC 31.9 10/17/2013 0425   MCHC 30.2* 06/05/2013 1010   RDW 21.3* 10/17/2013 0425   RDW 16.9* 06/05/2013 1010   LYMPHSABS 1.1 10/17/2013 0425   LYMPHSABS 1.6 06/05/2013 1010   MONOABS 0.8 10/17/2013 0425   MONOABS 0.5 06/05/2013 1010   EOSABS 0.0 10/17/2013 0425   EOSABS 0.1 06/05/2013 1010   BASOSABS 0.0 10/17/2013 0425   BASOSABS 0.1 06/05/2013 1010    Studies/Results: No results found.  Medications: I have reviewed the patient's current medications.  Assessment/Plan:  1. 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 DVT Prophylaxis/Anticoagulation: Pharmaceutical: Lovenox  3. Pain Management: On oxycontin 50mg  bid, reduce to 40mg  due to lethargy. Oxycodone 10 mg prn breakthrough pain.  4. Depressed Mood: continue cymbalta.  -still a major issue  5. Neuropsych: This patient is capable of making decisions on his own behalf.  6. HTN: Monitor every 8 hours. Lisinopril resumed today.  7. H/o rectal cancer: Has problems with rectal pain when constipated. Last BM 6/20, adjust meds  8. Chronic constipation: Will increase Senna S to 2 pills bid. Suppository every 24 hours if no BM.  9. 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 Continue Lantus 5 units and will titrate to home as indicated  10. Iron deficiency anemia: Loaded with IV iron. Continue po supplement. Check orthostatics, stool for OB, consider transfusion if symptomatic  11. H/o CVA with left spastic hemiparesis: Continue aggrenox.  12. Skin: Fungal  infection clearing , Right buttocks improving but Left buttocks is not, because of Left hemiplegia, pt tends to lay on Left side, will Ask nsg to position pt on R side Added diflucan, continue local care per WOC recs. Air mattress  - WOC following  -needs to be OOB as much as possible given sacral wounds, All meals in chair 13.  Per Wound Care RN request - IV morphine prior to wound care      Length of stay, days: 12  Walker Kehr , MD 10/25/2013, 8:56 AM

## 2013-10-26 ENCOUNTER — Inpatient Hospital Stay (HOSPITAL_COMMUNITY): Payer: Self-pay | Admitting: Occupational Therapy

## 2013-10-26 ENCOUNTER — Encounter (HOSPITAL_COMMUNITY): Payer: Self-pay | Admitting: Occupational Therapy

## 2013-10-26 ENCOUNTER — Inpatient Hospital Stay (HOSPITAL_COMMUNITY): Payer: Self-pay | Admitting: Rehabilitation

## 2013-10-26 ENCOUNTER — Ambulatory Visit (HOSPITAL_COMMUNITY): Payer: Self-pay

## 2013-10-26 LAB — GLUCOSE, CAPILLARY
GLUCOSE-CAPILLARY: 173 mg/dL — AB (ref 70–99)
Glucose-Capillary: 167 mg/dL — ABNORMAL HIGH (ref 70–99)
Glucose-Capillary: 172 mg/dL — ABNORMAL HIGH (ref 70–99)
Glucose-Capillary: 260 mg/dL — ABNORMAL HIGH (ref 70–99)

## 2013-10-26 MED ORDER — DULOXETINE HCL 30 MG PO CPEP
30.0000 mg | ORAL_CAPSULE | Freq: Every day | ORAL | Status: DC
  Administered 2013-10-27 – 2013-10-28 (×2): 30 mg via ORAL
  Filled 2013-10-26 (×3): qty 1

## 2013-10-26 MED ORDER — MORPHINE SULFATE 2 MG/ML IJ SOLN
2.0000 mg | Freq: Every day | INTRAMUSCULAR | Status: DC
  Filled 2013-10-26: qty 1

## 2013-10-26 MED ORDER — ALPRAZOLAM 0.25 MG PO TABS
0.2500 mg | ORAL_TABLET | Freq: Two times a day (BID) | ORAL | Status: DC
  Administered 2013-10-26 – 2013-10-28 (×5): 0.25 mg via ORAL
  Filled 2013-10-26 (×5): qty 1

## 2013-10-26 MED ORDER — SACCHAROMYCES BOULARDII 250 MG PO CAPS
500.0000 mg | ORAL_CAPSULE | Freq: Two times a day (BID) | ORAL | Status: DC
  Administered 2013-10-26 – 2013-10-28 (×6): 500 mg via ORAL
  Filled 2013-10-26 (×12): qty 2

## 2013-10-26 NOTE — Consult Note (Signed)
WOC wound follow up Wound type: buttock and gluteal cleft pressure ulcers in combination with MASD  Measurement: Left buttock 8cm x 4.5cm x 0.2cm; upper gluteal cleft largest wound 4.0cm x 2.0cm x 0.2cm with some scattered areas distal to this one that are full thickness as well.  Wound bed: the upper wounds in the gluteal cleft are yellow with some epithelial buds but still mostly covered.  He has intense redness over the area but suspect this is from the incontinence. Was completely covered in stool this am and reports he does not sense the when he has BM on him.  The left buttock is the worst of these areas 100% yellow/brown adherent slough.  Discussed with PT at the bedside for hydrotherapy, may benefit from ultrasonic mist instead of pulsatile lavage however pts tolerance for the therapy may limit the ability to use this treatment.   Discussed premedication with IV pain meds and use of topical anesthetic with PA on the unit today.  Communicated with PT that plans are to order new pain meds and I have requested bedside nursing staff to premedicate prior to next treatment.  Pt prefers non powered air mattress and is turning from side to side to stay off this area.  Drainage (amount, consistency, odor) moderate, serosanguinous, unable to assess drainage today due to the presence of stool. Periwound: erythema  Dressing procedure/placement/frequency: continue hydrotherapy daily. PT to try ultrasonic mist at next treatment with premedication prior to treatment.  Turn from side to side. Continue Santyl enzymatic ointment for the wounds daily after treatment.  Provided encouragement and support for the patient.    WOC will follow along with you for wound assessments   Noted: left amputation site with staples intact, continues to drain centrally but the incision is note open.  May benefit from incision NPWT to control drainage better.  I have been using silver alginate for the antimicrobial effects and  the absorption but the incision continues to drain and have erythema.  Not sure when [facility] will reeval this wound.  Melody [place] RN,CWOCN [phone]

## 2013-10-26 NOTE — Progress Notes (Signed)
Occupational Therapy Session Note  Patient Details  Name: Donald [name] MRN: [id number] Date of Birth: 1945/05/12  Today's Date: 10/26/2013 Time: 1003-1100 Time Calculation (min): 57 min  Short Term Goals: Week 2:  OT Short Term Goal 1 (Week 2): LTGs equal to min assist level to supervision.  Skilled Therapeutic Interventions/Progress Updates:    Pt performed bathing and dressing sit to stand at the sink.  He needs mod assist for supine to sit EOB with noted increased lean to the right in sitting secondary to increased buttocks pain.  He was able to transfer to the left side with max assist squat pivot.  Decreased forward trunk flexion before attempting to stand resulting in the need for extensive assistance to lift his bottom from the bed.  Pt needing min assist to wash his right arm as well as washing his right foot.  Mod assist for sit to stand with use of the sink for support.  He was able to stand while therapist provided total assist for pulling shorts over hips.  Max assist for donning right sock but he was able to slide on his shoe and fasten with velcro.    Therapy Documentation Precautions:  Precautions Precautions: Fall Precaution Comments: LT AKA wrap, pt is anxious with mobility, history of Left hemiparesis and increased tone., buttocks wounds  Restrictions Weight Bearing Restrictions: No  Pain: Pain Assessment Pain Assessment: Faces Faces Pain Scale: Hurts little more Pain Type: Acute pain Pain Location: Buttocks Pain Orientation: Left Pain Intervention(s): Medication (See eMAR);Repositioned ADL: See FIM for current functional status  Therapy/Group: Individual Therapy  MCGUIRE,JAMES OTR/L 10/26/2013, 12:10 PM

## 2013-10-26 NOTE — Progress Notes (Signed)
Occupational Therapy Session Note  Patient Details  Name: Donald [name] MRN: [id number] Date of Birth: 18-Dec-1945  Today's Date: 10/26/2013 Time: 1500-1540 Time Calculation (min): 40 min  Short Term Goals: Week 2:  OT Short Term Goal 1 (Week 2): LTGs equal to min assist level to supervision.  Skilled Therapeutic Interventions/Progress Updates:    Pt performed dressing to begin session as he had been in the bed secondary to bowel accident and for access to portacath.  He needed mod assist to transfer to the EOB and maintain initial sitting balance.  Performed UB dressing with min guard assist to donn pullover shirt while sitting EOB.  Therapist provided max assist for sit to stand in order to pull his shorts over his bottom.  Pt transferred to the left from bed to wheelchair with max assist squat pivot.  Transported to the gym and then transferred to the therapy mat with max assist to the left side.  In sitting worked on decreasing over use of the right trunk and incorporation of the LUE in weightbearing in sitting.  Therapist helped to stretch finger flexors and positioned the LUE on the edge of the mat.  Began by having pt work on lateral leans to the left side while attempting to push himself back to sitting.  [name] demonstrates moderate difficulty relaxing his right side and tends to lead every movement with his head and right trunk.  Minimal activity in the left UE and trunk to return to sitting position.  Pt with moderate tightness in both sides of his trunk.  Progressed to working on forward trunk flexion for reaching and scooting as well.  Finished with squat pivot transfer back to the wheelchair with mod assist to the right side.    Therapy Documentation Precautions:  Precautions Precautions: Fall Precaution Comments: LT AKA wrap, pt is anxious with mobility, history of Left hemiparesis and increased tone., buttocks wounds  Restrictions Weight Bearing Restrictions:  No  Pain: Pain Assessment Pain Assessment: Faces Pain Score: 2  Faces Pain Scale: Hurts little more Pain Type: Acute pain Pain Location: Buttocks Pain Intervention(s): Medication (See eMAR);Repositioned;Emotional support ADL: See FIM for current functional status  Therapy/Group: Individual Therapy  MCGUIRE,JAMES OTR/L 10/26/2013, 3:55 PM

## 2013-10-26 NOTE — Progress Notes (Signed)
Subjective/Complaints: 68 y.o. right-handed male with history of CVA with left-sided weakness, HTN, PVD with multiple revascularization procedures as well his recent thrombectomy of occluded FPBG with 4 compartment fasciotomy requiring VAC 5/ 2015. He was d/c from CIR on 09/24/13 and was ambulating at supervision level with RW. He started developing increasing LLE pain with onset of numbness 10/07/13 due to recurrent thrombosis left femoral popliteal bypass. Patient has refused chronic coumadin therapy as well as removal of infected L-axillofemoral BG. High L-AKA recommended due to level of demarcation. On 10/08/13, patient underwent L-AKA by Dr. Trula Slade and post op with severe depressive symptoms with lability as well as poor pain control. He was started on Oxycontin for more pain management as well as Toradol. Was treated with IV iron infusion for anemia. Pschiatry consulted for input and patient started on Cymbalta for severe acute depressive symptoms  Problem with pain during hydrotherapy. Taking extra pain medication results in over sedation and unable to anticipate and therapy. Wound care wondering about topical lidocaine versus IV pain medications. With broken skin topical lidocaine can have increased systemic absorption and possible cardiotoxicity Appreciate psych note Review of Systems - Negative except Left buttocks pain  Objective: Vital Signs: Blood pressure 138/82, pulse 90, temperature 97.6 F (36.4 C), temperature source Oral, resp. rate 18, weight 75.137 kg (165 lb 10.4 oz), SpO2 95.00%. No results found. Results for orders placed during the hospital encounter of 10/13/13 (from the past 72 hour(s))  GLUCOSE, CAPILLARY     Status: Abnormal   Collection Time    10/23/13  8:46 PM      Result Value Ref Range   Glucose-Capillary 180 (*) 70 - 99 mg/dL  GLUCOSE, CAPILLARY     Status: Abnormal   Collection Time    10/24/13  7:02 AM      Result Value Ref Range   Glucose-Capillary  156 (*) 70 - 99 mg/dL   Comment 1 Notify RN    GLUCOSE, CAPILLARY     Status: Abnormal   Collection Time    10/24/13 11:36 AM      Result Value Ref Range   Glucose-Capillary 265 (*) 70 - 99 mg/dL  GLUCOSE, CAPILLARY     Status: Abnormal   Collection Time    10/24/13  4:27 PM      Result Value Ref Range   Glucose-Capillary 136 (*) 70 - 99 mg/dL  GLUCOSE, CAPILLARY     Status: Abnormal   Collection Time    10/24/13  9:04 PM      Result Value Ref Range   Glucose-Capillary 156 (*) 70 - 99 mg/dL  GLUCOSE, CAPILLARY     Status: Abnormal   Collection Time    10/25/13  7:11 AM      Result Value Ref Range   Glucose-Capillary 137 (*) 70 - 99 mg/dL   Comment 1 Notify RN    GLUCOSE, CAPILLARY     Status: Abnormal   Collection Time    10/25/13 11:12 AM      Result Value Ref Range   Glucose-Capillary 206 (*) 70 - 99 mg/dL   Comment 1 Notify RN    GLUCOSE, CAPILLARY     Status: Abnormal   Collection Time    10/25/13  4:50 PM      Result Value Ref Range   Glucose-Capillary 227 (*) 70 - 99 mg/dL  GLUCOSE, CAPILLARY     Status: Abnormal   Collection Time    10/25/13  9:47 PM  Result Value Ref Range   Glucose-Capillary 128 (*) 70 - 99 mg/dL   Comment 1 Notify RN    GLUCOSE, CAPILLARY     Status: Abnormal   Collection Time    10/26/13  7:41 AM      Result Value Ref Range   Glucose-Capillary 167 (*) 70 - 99 mg/dL  GLUCOSE, CAPILLARY     Status: Abnormal   Collection Time    10/26/13 11:25 AM      Result Value Ref Range   Glucose-Capillary 173 (*) 70 - 99 mg/dL     HEENT: poor oral hygiene, left facial weakness Cardio: RRR and no  murmur Resp: CTA B/L and unlabored GI: BS positive and NT, ND Extremity:  Pulses positive and No Edema Skin:  No satellite lesions,, left butocks with Grade 2 decub, clean with eschar surrounded by granulation tissue Neuro: Alert/Oriented, Abnormal Motor 2-/5 R delt bi tri grip, 2- L HF, RIght side normal strength, Tone:  Increased Reduced LT  sensation R foot Flexor tone LUE and Dysarthric Musc/Skel:  Other left AKA, now with occlusive dressing L AKA site Gen NAD Ext R foot warm, intrinsic muscle atrophy,no lesions  Assessment/Plan: 1. Functional deficits secondary to Left AKA /PAD which require 3+ hours per day of interdisciplinary therapy in a comprehensive inpatient rehab setting. Physiatrist is providing close team supervision and 24 hour management of active medical problems listed below. Physiatrist and rehab team continue to assess barriers to discharge/monitor patient progress toward functional and medical goals. FIM: FIM - Bathing Bathing Steps Patient Completed: Chest;Left Arm;Abdomen;Right upper leg;Front perineal area;Right lower leg (including foot) Bathing: 3: Mod-Patient completes 5-7 38f 10 parts or 50-74%  FIM - Upper Body Dressing/Undressing Upper body dressing/undressing steps patient completed: Thread/unthread left sleeve of pullover shirt/dress;Put head through opening of pull over shirt/dress;Pull shirt over trunk;Thread/unthread right sleeve of pullover shirt/dresss Upper body dressing/undressing: 5: Supervision: Safety issues/verbal cues FIM - Lower Body Dressing/Undressing Lower body dressing/undressing steps patient completed: Fasten/unfasten right shoe;Don/Doff right shoe Lower body dressing/undressing: 3: Mod-Patient completed 50-74% of tasks  FIM - Toileting Toileting steps completed by patient: Performs perineal hygiene Toileting: 1: Two helpers  FIM - Radio producer Devices: Recruitment consultant Transfers: 3-To toilet/BSC: Mod A (lift or lower assist)  FIM - Control and instrumentation engineer Devices: Arm rests Bed/Chair Transfer: 2: Bed > Chair or W/C: Max A (lift and lower assist);3: Sit > Supine: Mod A (lifting assist/Pt. 50-74%/lift 2 legs)  FIM - Locomotion: Wheelchair Distance: 100 Locomotion: Wheelchair: 0: Activity did not occur FIM -  Locomotion: Ambulation Locomotion: Ambulation Assistive Devices: Engineer, agricultural Ambulation/Gait Assistance: 1: +2 Total assist (Mod w/ +2 for chair follow) Locomotion: Ambulation: 0: Activity did not occur  Comprehension Comprehension Mode: Auditory Comprehension: 5-Understands complex 90% of the time/Cues < 10% of the time  Expression Expression Mode: Verbal Expression: 5-Expresses complex 90% of the time/cues < 10% of the time  Social Interaction Social Interaction: 5-Interacts appropriately 90% of the time - Needs monitoring or encouragement for participation or interaction.  Problem Solving Problem Solving: 5-Solves basic problems: With no assist  Memory Memory: 5-Requires cues to use assistive device  Medical Problem List and Plan:  1. 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 DVT Prophylaxis/Anticoagulation: Pharmaceutical: Lovenox  3. Pain Management: On oxycontin  50mg  bid, reduce to 40mg  due to lethargy. Oxycodone 10 mg prn breakthrough pain.  4.  Depressed Mood: continue cymbalta.   -still a major issue  5. Neuropsych: This patient is capable of making decisions on his own behalf.  6. HTN: Monitor every 8 hours. Lisinopril resumed today.  7. H/o rectal cancer: Has problems with rectal pain when constipated. Last BM 6/20, adjust meds 8. Chronic constipation: Will increase Senna S to 2 pills bid. Suppository every 24 hours if no BM.  9. 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 Continue Lantus 5 units and will titrate to home as indicated 10. Iron deficiency anemia: Loaded with IV iron. Continue po supplement. Check orthostatics, stool for OB, consider transfusion if symptomatic 11. H/o CVA with left spastic hemiparesis: Continue aggrenox.  12. Skin: Fungal infection clearing , Right buttocks improving but Left buttocks is not, because of Left hemiplegia, pt tends to lay on Left side,  will Ask nsg to position pt on R side Added diflucan, continue local care per WOC recs. - WOC following, asked to have hydrotherapy at the end of the day so that extra pain medication can be given  -needs to be OOB as much as possible given sacral wounds, All meals in chair   LOS (Days) [place] E 10/26/2013, 4:34 PM

## 2013-10-26 NOTE — Progress Notes (Signed)
Flexiseal placed in rectal vault to control diarrhea. Stool was pasty consistency compared to peanut butter. No stool drain inside to tube. Patient tolerated Flexiseal placement well. Creams applied with dry dressing and pt repositioned on his side.

## 2013-10-26 NOTE — Progress Notes (Signed)
Physical Therapy Wound Treatment Patient Details  Name: Donald [name] MRN: [id number] Date of Birth: 05-08-1945  Today's Date: 10/26/2013 Time: 0820-0912 Time Calculation (min): 52 min  Subjective  Subjective: I'm so tense afterwards.  Prior Treatments: "every kind of cream you can imagine"  Pain Score: Pain Score: 10/10 despite pre-medication; treatment paused at pt's request 3 times, and then resumed when pt able  Wound Assessment  Clinical Statement: Seen with Melody, RN, WOC. Distal Lt buttock wound covered with stool (had gotten under his dressing). Pt incontinent of stool and unaware of this fact on arrival. Discussed option of ultrasonic hydrotherapy for it's bacteriacidal effects along with improved ability to remove adherent yellow eschar. This treatment is typically more painful. Melody to speak with PA/MD re: possible use of topical lidocaine on wound prior to hydrotherapy and IV pain medicine prior.  Dressing Type Barrier Film (skin prep);Gauze (Comment);Moist to moist;Other (Comment);ABD, Santyl 10/26/2013  9:33 AM  Dressing Changed Changed 10/26/2013  9:33 AM  Dressing Status Clean;Dry;Intact 10/26/2013  9:33 AM  Dressing Change Frequency Daily 10/26/2013  9:33 AM  Site / Wound Assessment Painful;Yellow;Brown 10/26/2013  9:33 AM  % Wound base Red or Granulating 0% 10/26/2013  9:33 AM  % Wound base Yellow 90% 10/26/2013  9:33 AM  % Wound base Black 10% 10/26/2013  9:33 AM  % Wound base Other (Comment) 0% 10/26/2013  9:33 AM  Peri-wound Assessment Erythema (non-blanchable);Pink;Excoriated 10/26/2013  9:33 AM  Wound Length (cm) 7.8 cm 10/22/2013  4:01 PM  Wound Width (cm) 5.7 cm 10/22/2013  4:01 PM  Wound Depth (cm) 0.2 cm 10/13/2013  5:12 PM  Margins Unattached edges (unapproximated) 10/26/2013  9:33 AM  Closure None 10/26/2013  9:33 AM  Drainage Amount Other (Comment) 10/26/2013  9:33 AM  Drainage Description Other (Comment) 10/26/2013  9:33 AM  Non-staged Wound Description Partial  thickness 10/26/2013  9:33 AM  Treatment Debridement (Selective);Hydrotherapy (Pulse lavage);Packing (Saline gauze) 10/26/2013  9:33 AM     Wound / Incision (Open or Dehisced) 10/13/13 Other (Comment) Buttocks Left;Proximal MSDA; includes 9 open areas proximal to large buttock wound (Active)  Dressing Type Barrier Film (skin prep);Gauze (Comment);ABD;Moist to moist; Santyl 10/26/2013  9:33 AM  Dressing Changed Changed 10/26/2013  9:33 AM  Dressing Status Clean;Dry;Intact 10/26/2013  9:33 AM  Dressing Change Frequency Daily 10/26/2013  9:33 AM  Site / Wound Assessment Painful;Pink;Yellow 10/26/2013  9:33 AM  % Wound base Red or Granulating 5% 10/26/2013  9:33 AM  % Wound base Yellow 95% 10/26/2013  9:33 AM  % Wound base Black 0% 10/26/2013  9:33 AM  % Wound base Other (Comment) 0% 10/26/2013  9:33 AM  Peri-wound Assessment Erythema (non-blanchable);Excoriated;Pink 10/26/2013  9:33 AM  Wound Length (cm) 6.5 cm 10/22/2013  4:01 PM  Wound Width (cm) 5.8 cm 10/22/2013  4:01 PM  Wound Depth (cm) 1 cm 10/13/2013  5:12 PM  Tunneling (cm) 0.2 10/13/2013  5:12 PM  Margins Unattached edges (unapproximated) 10/26/2013  9:33 AM  Closure None 10/26/2013  9:33 AM  Drainage Amount Other (Comment) 10/26/2013  9:33 AM  Drainage Description Other (Comment) 10/26/2013  9:33 AM  Non-staged Wound Description Partial thickness 10/26/2013  9:33 AM  Treatment Debridement (Selective);Hydrotherapy (Pulse lavage);Packing (Saline gauze) 10/26/2013  9:33 AM      Hydrotherapy Pulsed lavage therapy - wound location: Lt buttocks and sacrum Pulsed Lavage with Suction (psi): 4 psi (pt requesting rest breaks due to pain) Pulsed Lavage with Suction - Normal Saline Used: 1000 mL Pulsed Lavage  Tip: Tip with splash shield Selective Debridement Selective Debridement - Location: Lt buttock wounds Selective Debridement - Tools Used: Forceps;Scalpel Selective Debridement - Tissue Removed: yellow, brown slough   Wound Assessment and Plan  Wound  Therapy - Assess/Plan/Recommendations Wound Therapy - Clinical Statement: Seen with Melody, RN, WOC. Distal Lt buttock wound covered with stool (had gotten under his dressing). Pt incontinent of stool and unaware of this fact on arrival. Discussed option of ultrasonic hydrotherapy for it's bacteriacidal effects along with improved ability to remove adherent yellow eschar. This treatment is typically more painful. Melody to speak with PA/MD re: possible use of topical lidocaine on wound prior to hydrotherapy and IV pain medicine prior. Wound Therapy - Functional Problem List: limiting all mobility per PT & OT due to pain Factors Delaying/Impairing Wound Healing: Diabetes Mellitus;Immobility;Multiple medical problems Hydrotherapy Plan: Debridement;Dressing change;Patient/family education;Pulsatile lavage with suction Wound Therapy - Frequency: 6X / week Wound Therapy - Follow Up Recommendations: Home health RN Wound Plan: see above  Wound Therapy Goals- Improve the function of patient's integumentary system by progressing the wound(s) through the phases of wound healing (inflammation - proliferation - remodeling) by: Decrease Necrotic Tissue to: 70 (distal Lt buttock wound) Decrease Necrotic Tissue - Progress: Not progressing Increase Granulation Tissue to: 30 (distal Lt buttock wound) Increase Granulation Tissue - Progress: Mot progressing  Goals will be updated until maximal potential achieved or discharge criteria met.  Discharge criteria: when goals achieved, discharge from hospital, MD decision/surgical intervention, no progress towards goals, refusal/missing three consecutive treatments without notification or medical reason.  GP     SASSER,LYNN 10/26/2013, 9:43 AM Pager [phone]

## 2013-10-26 NOTE — Progress Notes (Signed)
Physical Therapy Session Note  Patient Details  Name: Donald [name] MRN: [id number] Date of Birth: [birth year]  Today's Date: 10/26/2013 Time: 1302-1400 Time Calculation (min): 58 min  Short Term Goals: Week 2:  PT Short Term Goal 1 (Week 2): Pt will perform bed mobility with S with HOB flat and without bed rails PT Short Term Goal 2 (Week 2): Pt will perform dynamic standing x 3 mins at min A level with RUE supported PT Short Term Goal 3 (Week 2): Pt will self propel w/c x 150' using R hemi technique at S level PT Short Term Goal 4 (Week 2): Pt will verbalize pressure relief techniques with min cues PT Short Term Goal 5 (Week 2): Pt will perform stand/squat pivot transfer with min A.   Skilled Therapeutic Interventions/Progress Updates:   Pt received sitting in w/c in room, agreeable to therapy.  Had pt self propel x 100' towards gym using R hemi technique.  Pt with several rest breaks, then requiring assist due to fatigue to get to ADL apt.  Skilled session focused on squat pivot transfers, bed mobility and standing.  Performed squat pivot w/c<>bed at mod A level.  Therapist providing less forceful hand placement to allow pt to increase forward weight shift and demonstrate decreased anxiety prior to transfer.  Performed bed mobility with min A (sit>SL>supine and vice versa).  Does well with bed mobility techniques, however is limited due to increased tone and decreased breathing during activity.  Transferred back to w/c in order to go to therapy gym to work on standing.  Attempted several trials, then note on last stand, pt incontinent of bowel, therefore assisted back to room and into bed (same assist and cues/facilitation as mentioned above).  Pt performed several reps of rolling at min A level (with bed rails) in order to assist with peri care.  RN called to room in order to apply flexi seal, however had not gotten to floor yet, therefore left pt in bed without shorts (for decreased moisture  with sheet over pt to wait for next session.  Pt left with all needs in reach.   Therapy Documentation Precautions:  Precautions Precautions: Fall Precaution Comments: LT AKA wrap, pt is anxious with mobility, history of Left hemiparesis and increased tone., buttocks wounds  Restrictions Weight Bearing Restrictions: No   Vital Signs: Therapy Vitals Temp: 97.6 F (36.4 C) Temp src: Oral Pulse Rate: 90 Resp: 18 BP: 138/82 mmHg Patient Position (if appropriate): Lying Oxygen Therapy SpO2: 95 % O2 Device: Nasal cannula O2 Flow Rate (L/min): 2.5 L/min Pain: Pain Assessment Pain Score: 2 :  See FIM for current functional status  Therapy/Group: Individual Therapy  Denice Bors 10/26/2013, 3:29 PM

## 2013-10-27 ENCOUNTER — Inpatient Hospital Stay (HOSPITAL_COMMUNITY): Payer: Self-pay | Admitting: Occupational Therapy

## 2013-10-27 ENCOUNTER — Ambulatory Visit (HOSPITAL_COMMUNITY): Payer: Self-pay

## 2013-10-27 ENCOUNTER — Encounter (HOSPITAL_COMMUNITY): Payer: Self-pay | Admitting: Occupational Therapy

## 2013-10-27 ENCOUNTER — Inpatient Hospital Stay (HOSPITAL_COMMUNITY): Payer: Self-pay | Admitting: Rehabilitation

## 2013-10-27 LAB — URINALYSIS, ROUTINE W REFLEX MICROSCOPIC
BILIRUBIN URINE: NEGATIVE
Glucose, UA: 250 mg/dL — AB
HGB URINE DIPSTICK: NEGATIVE
Ketones, ur: 15 mg/dL — AB
Leukocytes, UA: NEGATIVE
Nitrite: NEGATIVE
PH: 7 (ref 5.0–8.0)
Protein, ur: 30 mg/dL — AB
SPECIFIC GRAVITY, URINE: 1.022 (ref 1.005–1.030)
UROBILINOGEN UA: 1 mg/dL (ref 0.0–1.0)

## 2013-10-27 LAB — URINE MICROSCOPIC-ADD ON

## 2013-10-27 LAB — BASIC METABOLIC PANEL
BUN: 11 mg/dL (ref 6–23)
CALCIUM: 8.4 mg/dL (ref 8.4–10.5)
CHLORIDE: 88 meq/L — AB (ref 96–112)
CO2: 27 mEq/L (ref 19–32)
Creatinine, Ser: 0.73 mg/dL (ref 0.50–1.35)
GFR calc Af Amer: >90 mL/min (ref >90)
GFR calc non Af Amer: >90 mL/min (ref >90)
GLUCOSE: 163 mg/dL — AB (ref 70–99)
Potassium: 4.2 mEq/L (ref 3.7–5.3)
Sodium: 128 mEq/L — ABNORMAL LOW (ref 137–147)

## 2013-10-27 LAB — CBC
HEMATOCRIT: 32.5 % — AB (ref 39.0–52.0)
Hemoglobin: 10.3 g/dL — ABNORMAL LOW (ref 13.0–17.0)
MCH: 26.9 pg (ref 26.0–34.0)
MCHC: 31.7 g/dL (ref 30.0–36.0)
MCV: 84.9 fL (ref 78.0–100.0)
PLATELETS: 606 10*3/uL — AB (ref 150–400)
RBC: 3.83 MIL/uL — ABNORMAL LOW (ref 4.22–5.81)
RDW: 20.2 % — AB (ref 11.5–15.5)
WBC: 16.3 10*3/uL — AB (ref 4.0–10.5)

## 2013-10-27 LAB — GLUCOSE, CAPILLARY
GLUCOSE-CAPILLARY: 232 mg/dL — AB (ref 70–99)
GLUCOSE-CAPILLARY: 251 mg/dL — AB (ref 70–99)
Glucose-Capillary: 176 mg/dL — ABNORMAL HIGH (ref 70–99)
Glucose-Capillary: 246 mg/dL — ABNORMAL HIGH (ref 70–99)

## 2013-10-27 MED ORDER — SENNOSIDES-DOCUSATE SODIUM 8.6-50 MG PO TABS
2.0000 | ORAL_TABLET | Freq: Every day | ORAL | Status: DC
  Administered 2013-10-27 – 2013-10-28 (×2): 2 via ORAL
  Filled 2013-10-27 (×2): qty 2

## 2013-10-27 MED ORDER — MORPHINE SULFATE 2 MG/ML IJ SOLN
2.0000 mg | Freq: Every day | INTRAMUSCULAR | Status: DC
  Administered 2013-10-27 – 2013-10-28 (×2): 2 mg via INTRAVENOUS
  Filled 2013-10-27 (×3): qty 1

## 2013-10-27 MED ORDER — ALPRAZOLAM 0.5 MG PO TABS
0.5000 mg | ORAL_TABLET | Freq: Every day | ORAL | Status: DC
  Administered 2013-10-27 – 2013-10-28 (×2): 0.5 mg via ORAL
  Filled 2013-10-27 (×2): qty 1

## 2013-10-27 MED ORDER — PRO-STAT SUGAR FREE PO LIQD
30.0000 mL | Freq: Three times a day (TID) | ORAL | Status: DC
  Administered 2013-10-28: 08:00:00 via ORAL
  Filled 2013-10-27 (×9): qty 30

## 2013-10-27 MED ORDER — VANCOMYCIN HCL IN DEXTROSE 1-5 GM/200ML-% IV SOLN
1000.0000 mg | Freq: Two times a day (BID) | INTRAVENOUS | Status: DC
  Administered 2013-10-27 – 2013-10-29 (×4): 1000 mg via INTRAVENOUS
  Filled 2013-10-27 (×5): qty 200

## 2013-10-27 MED ORDER — NYSTATIN 100000 UNIT/GM EX POWD
Freq: Three times a day (TID) | CUTANEOUS | Status: DC
  Administered 2013-10-27 – 2013-10-28 (×5): via TOPICAL
  Filled 2013-10-27: qty 15

## 2013-10-27 MED ORDER — SODIUM CHLORIDE 0.9 % IV SOLN
INTRAVENOUS | Status: DC
  Administered 2013-10-27: 12:00:00 via INTRAVENOUS

## 2013-10-27 MED ORDER — SENNA 8.6 MG PO TABS: 1.0000 | ORAL_TABLET | Freq: Every day | ORAL | Status: DC

## 2013-10-27 MED ORDER — ENSURE COMPLETE PO LIQD: 237.0000 mL | Freq: Every day | ORAL | Status: DC

## 2013-10-27 MED ORDER — PIPERACILLIN-TAZOBACTAM 3.375 G IVPB
3.3750 g | Freq: Three times a day (TID) | INTRAVENOUS | Status: DC
  Administered 2013-10-27 – 2013-10-29 (×6): 3.375 g via INTRAVENOUS
  Filled 2013-10-27 (×7): qty 50

## 2013-10-27 NOTE — Progress Notes (Signed)
Occupational Therapy Session Note  Patient Details  Name: Donald [name] MRN: [id number] Date of Birth: March 17, 1946  Today's Date: 10/27/2013 Time: 1003-1105 Time Calculation (min): 62 min  Short Term Goals: Week 1:  OT Short Term Goal 1 (Week 1): Short Term Goals = Long Term Goals due to ELOS Week 2:  OT Short Term Goal 1 (Week 2): LTGs equal to min assist level to supervision.  Skilled Therapeutic Interventions/Progress Updates:    Pt with nursing at beginning of session having bowel cleaned up and flexiseal re-inserted.  While in supine position had pt perform wash his private area and apply powder while also donning brief.  He was able to roll to the left with min guard and then to the right with min assist.  Transitioned to sitting with mod assist and then transferred to the wheelchair squat pivot with max assist.  Pt performed UB bathing with min assist and UB dressing with supervision.  He needed max assist for donning shorts over his RLE and then pulling over his hips while standing.  He was only able to tolerate standing for approximately 10 seconds.  Therapist wrapped LLE with ace bandage as well.    Therapy Documentation Precautions:  Precautions Precautions: Fall Precaution Comments: LT AKA wrap, pt is anxious with mobility, history of Left hemiparesis and increased tone., buttocks wounds  Restrictions Weight Bearing Restrictions: No Pain: Pain Assessment Pain Assessment: Faces Faces Pain Scale: Hurts little more Pain Type: Acute pain Pain Location: Buttocks Pain Orientation: Right;Left Pain Intervention(s): Repositioned;Medication (See eMAR) ADL: See FIM for current functional status  Therapy/Group: Individual Therapy  MCGUIRE,JAMES OTR/L 10/27/2013, 12:44 PM

## 2013-10-27 NOTE — Progress Notes (Signed)
[facility] Progress Note  10/27/2013 2:09 PM POD 20  Subjective:  Still having soreness, but pain has improved  Afebrile x 24 hrs   Filed Vitals:   10/27/13 0500  BP: 164/74  Pulse: 100  Temp: 97.3 F (36.3 C)  Resp: 18    Physical Exam: Incisions:  There is increased purulence from the wound with breakdown at the medial aspect of the wound.  There is discoloration circumferentially and extending posterior on the leg.  Extremities:  Good ROM of stump.  CBC    Component Value Date/Time   WBC 16.3* 10/27/2013 0453   WBC 7.1 06/05/2013 1010   RBC 3.83* 10/27/2013 0453   RBC 4.23 06/05/2013 1010   RBC 3.54* 01/26/2012 0455   HGB 10.3* 10/27/2013 0453   HGB 9.3* 06/05/2013 1010   HCT 32.5* 10/27/2013 0453   HCT 30.8* 06/05/2013 1010   PLT 606* 10/27/2013 0453   PLT 324 06/05/2013 1010   MCV 84.9 10/27/2013 0453   MCV 72.9* 06/05/2013 1010   MCH 26.9 10/27/2013 0453   MCH 22.0* 06/05/2013 1010   MCHC 31.7 10/27/2013 0453   MCHC 30.2* 06/05/2013 1010   RDW 20.2* 10/27/2013 0453   RDW 16.9* 06/05/2013 1010   LYMPHSABS 1.1 10/17/2013 0425   LYMPHSABS 1.6 06/05/2013 1010   MONOABS 0.8 10/17/2013 0425   MONOABS 0.5 06/05/2013 1010   EOSABS 0.0 10/17/2013 0425   EOSABS 0.1 06/05/2013 1010   BASOSABS 0.0 10/17/2013 0425   BASOSABS 0.1 06/05/2013 1010    BMET    Component Value Date/Time   NA 128* 10/27/2013 0453   NA 137 06/05/2013 1010   NA 147* 10/12/2011 0959   K 4.2 10/27/2013 0453   K 5.4* 06/05/2013 1010   K 5.2* 10/12/2011 0959   CL 88* 10/27/2013 0453   CL 102 05/19/2012 1427   CL 102 10/12/2011 0959   CO2 27 10/27/2013 0453   CO2 23 06/05/2013 1010   CO2 29 10/12/2011 0959   GLUCOSE 163* 10/27/2013 0453   GLUCOSE 239* 06/05/2013 1010   GLUCOSE 205* 05/19/2012 1427   GLUCOSE 244* 10/12/2011 0959   BUN 11 10/27/2013 0453   BUN 19.5 06/05/2013 1010   BUN 13 10/12/2011 0959   CREATININE 0.73 10/27/2013 0453   CREATININE 1.4* 06/05/2013 1010   CREATININE 1.0 10/12/2011 0959   CALCIUM 8.4  10/27/2013 0453   CALCIUM 10.1 06/05/2013 1010   CALCIUM 9.0 10/12/2011 0959   GFRNONAA >90 10/27/2013 0453   GFRAA >90 10/27/2013 0453    INR    Component Value Date/Time   INR 1.06 10/07/2013 1748     Intake/Output Summary (Last 24 hours) at 10/27/13 1409 Last data filed at 10/27/13 0800  Gross per 24 hour  Intake    600 ml  Output    525 ml  Net     75 ml     Assessment/Plan:  68 y.o. 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 -start Vanc/Zosyn per pharmacy for left AKA stump infection.  Pt also with increasing WBC.  Pt is afebrile -Dr. Ilsa Iha to see pt this afternoon -the amputation was relatively high for an AKA-pt may need higher amputation or may need a hip disarticulation.    Leontine Locket, PA-C [facility] [phone] 10/27/2013 2:09 PM    I agree with the above.  There was drainage  and discolorization on the posterior flap.  I removed several staples.  The fascia fo rthe most part was intact.  I was able to advance a Q-tip in a small area about 3 cm.  I packed this with gauze and have recommended wet to dry dressing changes BID.  I did discuss with the patient and wife that he is at risk for non healing given his poor vascular status.  If this does not heal, he would need a more proximal amputation vs a hip disarticulation.  I would recommend conservative care for now with IV Abx and dressing changes.   Annamarie Major

## 2013-10-27 NOTE — Progress Notes (Signed)
ANTIBIOTIC CONSULT NOTE - INITIAL  Pharmacy Consult for vancomycin and zosyn Indication: left AKA stump infection  Allergies  Allergen Reactions  . Other     Became hypotensive. Pt states allergic to "muscle relaxer" but unsure of name.  . Tape Hives and Itching    medfix non woven tape    Patient Measurements: Weight: 165 lb 10.4 oz (75.137 kg) Adjusted Body Weight:   Vital Signs: Temp: 97.3 F (36.3 C) (06/30 0500) Temp src: Oral (06/30 0500) BP: 164/74 mmHg (06/30 0500) Pulse Rate: 100 (06/30 0500) Intake/Output from previous day: 06/29 0701 - 06/30 0700 In: 720 [P.O.:720] Out: 625 [Urine:625] Intake/Output from this shift: Total I/O In: 240 [P.O.:240] Out: -   Labs:  Recent Labs  10/27/13 0453  WBC 16.3*  HGB 10.3*  PLT 606*  CREATININE 0.73   The CrCl is unknown because both a height and weight (above a minimum accepted value) are required for this calculation. No results found for this basename: VANCOTROUGH, Corlis Leak, VANCORANDOM, GENTTROUGH, GENTPEAK, GENTRANDOM, TOBRATROUGH, TOBRAPEAK, TOBRARND, AMIKACINPEAK, AMIKACINTROU, AMIKACIN,  in the last 72 hours   Microbiology: Recent Results (from the past 720 hour(s))  SURGICAL PCR SCREEN     Status: None   Collection Time    10/08/13 12:12 AM      Result Value Ref Range Status   MRSA, PCR NEGATIVE  NEGATIVE Final   Staphylococcus aureus NEGATIVE  NEGATIVE Final   Comment:            The Xpert SA Assay (FDA     approved for NASAL specimens     in patients over 53 years of age),     is one component of     a comprehensive surveillance     program.  Test performance has     been validated by [employer] for patients greater     than or equal to 79 year old.     It is not intended     to diagnose infection nor to     guide or monitor treatment.    Medical History: Past Medical History  Diagnosis Date  . GERD (gastroesophageal reflux disease)   . Hyperlipidemia   . Hypertension   .  Peripheral vascular disease   . Internal and external bleeding hemorrhoids   . History of gout     left great toe  . Depression   . Numbness in right leg     Hx: of diabetes  . Coronary artery disease     RCA occlusion with good collaterals, o/w no obs CAD 12/2011  . Pneumonia ~ 1977  . Type II diabetes mellitus   . History of blood transfusion 12/2011; 12/2012    "before OR; after OR" (01/22/2013)  . Stroke 2008    "left side weak; unable to move left hand still" (01/22/2013)  . Rectal cancer dx'd 08/2007    S/P chemo, radiation, biopsies  . Metastases to the liver dx'd 08/2010    Medications:  Scheduled:  . acetaminophen  650 mg Oral Once  . ALPRAZolam  0.25 mg Oral BID  . ALPRAZolam  0.5 mg Oral QHS  . collagenase   Topical Daily  . dipyridamole-aspirin  1 capsule Oral BID  . DULoxetine  30 mg Oral Daily  . enoxaparin (LOVENOX) injection  40 mg Subcutaneous Q24H  . feeding supplement (ENSURE COMPLETE)  237 mL Oral Q1400  . feeding supplement (PRO-STAT SUGAR FREE 64)  30 mL Oral TID  WC  . Gerhardt's butt cream   Topical BID  . insulin aspart  0-5 Units Subcutaneous QHS  . insulin aspart  0-9 Units Subcutaneous TID WC  . insulin glargine  5 Units Subcutaneous QHS  . lisinopril  2.5 mg Oral Daily  .  morphine injection  2 mg Intravenous QPC breakfast  . multivitamin with minerals  1 tablet Oral Daily  . nicotine  7 mg Transdermal Daily  . nystatin   Topical TID  . OxyCODONE  40 mg Oral Q12H  . saccharomyces boulardii  500 mg Oral BID  . senna-docusate  2 tablet Oral QHS  . traZODone  25 mg Oral QHS   Infusions:  . sodium chloride 75 mL/hr at 10/27/13 1155   Assessment: 68 yo male with left AKA stump infection will be started on vancomycin and zosyn therapy.  SCr on 06/30 was 0.73 (CrCl ~ 69 when adjust SCr to 1 due to age); WBC 16.3.  Goal of Therapy:  Vancomycin trough level 15-20 mcg/ml  Plan:  1) Vancomycin 1g iv q12h and zosyn 3.375g iv q8h (4hr infusion) 2) Check  vancomycin trough when it's appropriate. 3) Monitor renal function and follow up on culture if available  So, Tsz-Yin 10/27/2013,2:46 PM

## 2013-10-27 NOTE — Progress Notes (Signed)
Physical Therapy Session Note  Patient Details  Name: Donald [name] MRN: [id number] Date of Birth: Mar 03, 1946  Today's Date: 10/27/2013 Time: 1302-1330 Time Calculation (min): 28 min  Short Term Goals: Week 2:  PT Short Term Goal 1 (Week 2): Pt will perform bed mobility with S with HOB flat and without bed rails PT Short Term Goal 2 (Week 2): Pt will perform dynamic standing x 3 mins at min A level with RUE supported PT Short Term Goal 3 (Week 2): Pt will self propel w/c x 150' using R hemi technique at S level PT Short Term Goal 4 (Week 2): Pt will verbalize pressure relief techniques with min cues PT Short Term Goal 5 (Week 2): Pt will perform stand/squat pivot transfer with min A.   Skilled Therapeutic Interventions/Progress Updates:   Pt received lying in bed, agreeable to therapy this afternoon.  Stating that he feels somewhat better, however pain in rectum from rectal tube.  Performed bed mobility with HOB flat and without rail at mod A level.  Continues to demonstrate increased resistance to trunk flex and rotation, therefore therapist with assist at trunk for such movements.  Once at EOB, assisted with donning shoe with max A for scooting to EOB due to increased resistance to forward trunk flexion.  Transferred into w/c with mod/max A.  Note somewhat better initiation of forward flexion, however once buttocks off of bed, note increased resistance to movement.  Had pt assist somewhat with w/c mobility x 45' total using R hemi technique.  Pt with extreme fatigue during session.  Assisted down to w/c room in order to add air to Roho cushion.  Pt left in w/c in room with all needs in reach.   Therapy Documentation Precautions:  Precautions Precautions: Fall Precaution Comments: LT AKA wrap, pt is anxious with mobility, history of Left hemiparesis and increased tone., buttocks wounds  Restrictions Weight Bearing Restrictions: No   Pain: Pt with c/o pain, however was not time for  pain meds yet.   See FIM for current functional status  Therapy/Group: Individual Therapy  Denice Bors 10/27/2013, 3:15 PM

## 2013-10-27 NOTE — Progress Notes (Signed)
Physical Therapy Session Note  Patient Details  Name: Donald [name] MRN: [id number] Date of Birth: 1945/12/07  Today's Date: 10/27/2013 Time: 2595-6387 Time Calculation (min): 47 min  Short Term Goals: Week 2:  PT Short Term Goal 1 (Week 2): Pt will perform bed mobility with S with HOB flat and without bed rails PT Short Term Goal 2 (Week 2): Pt will perform dynamic standing x 3 mins at min A level with RUE supported PT Short Term Goal 3 (Week 2): Pt will self propel w/c x 150' using R hemi technique at S level PT Short Term Goal 4 (Week 2): Pt will verbalize pressure relief techniques with min cues PT Short Term Goal 5 (Week 2): Pt will perform stand/squat pivot transfer with min A.   Skilled Therapeutic Interventions/Progress Updates:   Pt received lying in bed, agreeable to therapy session, however with max c/o pain.  RN notified and later provided with pain meds during session.  Attempted to perform bed mobility in order to have pt on EOB to receive pain meds, however upon rolling with min A, noted feces coming out around [place], therefore called on RN again to come assess situation prior to cleaning pt.  Assisted with peri care and RN applied cream and dressing.  Assisted with donning underwear in order to keep dressing in place.  Finished bed mobility with min A without bed rails to further work on trunk flex and rotation.  Once at [facility], RN provided meds.  Transferred bed<>w/c at max A.  Continue to facilitate increased trunk flex and forward weight shift, however he continues to tense up and resist all movement.  Transferred back to bed with bed mobility at min A level.  Left in bed with all needs in reach.    Therapy Documentation Precautions:  Precautions Precautions: Fall Precaution Comments: LT AKA wrap, pt is anxious with mobility, history of Left hemiparesis and increased tone., buttocks wounds  Restrictions Weight Bearing Restrictions: No   Pain: Pt with max c/o pain,  RN made aware and pain meds provided.    See FIM for current functional status  Therapy/Group: Individual Therapy  Denice Bors 10/27/2013, 10:13 AM

## 2013-10-27 NOTE — Progress Notes (Signed)
Subjective/Complaints: 68 y.o. right-handed male with history of CVA with left-sided weakness, HTN, PVD with multiple revascularization procedures as well his recent thrombectomy of occluded FPBG with 4 compartment fasciotomy requiring VAC 5/ 2015. He was d/c from CIR on 09/24/13 and was ambulating at supervision level with RW. He started developing increasing LLE pain with onset of numbness 10/07/13 due to recurrent thrombosis left femoral popliteal bypass. Patient has refused chronic coumadin therapy as well as removal of infected L-axillofemoral BG. High L-AKA recommended due to level of demarcation. On 10/08/13, patient underwent L-AKA by Dr. Trula Slade and post op with severe depressive symptoms with lability as well as poor pain control. He was started on Oxycontin for more pain management as well as Toradol. Was treated with IV iron infusion for anemia. Pschiatry consulted for input and patient started on Cymbalta for severe acute depressive symptoms  Poor tolerance of rehab program Appreciate psych note Review of Systems - Negative except Left buttocks pain  Objective: Vital Signs: Blood pressure 164/74, pulse 100, temperature 97.3 F (36.3 C), temperature source Oral, resp. rate 18, weight 75.137 kg (165 lb 10.4 oz), SpO2 97.00%. No results found. Results for orders placed during the hospital encounter of 10/13/13 (from the past 72 hour(s))  GLUCOSE, CAPILLARY     Status: Abnormal   Collection Time    10/24/13 11:36 AM      Result Value Ref Range   Glucose-Capillary 265 (*) 70 - 99 mg/dL  GLUCOSE, CAPILLARY     Status: Abnormal   Collection Time    10/24/13  4:27 PM      Result Value Ref Range   Glucose-Capillary 136 (*) 70 - 99 mg/dL  GLUCOSE, CAPILLARY     Status: Abnormal   Collection Time    10/24/13  9:04 PM      Result Value Ref Range   Glucose-Capillary 156 (*) 70 - 99 mg/dL  GLUCOSE, CAPILLARY     Status: Abnormal   Collection Time    10/25/13  7:11 AM      Result  Value Ref Range   Glucose-Capillary 137 (*) 70 - 99 mg/dL   Comment 1 Notify RN    GLUCOSE, CAPILLARY     Status: Abnormal   Collection Time    10/25/13 11:12 AM      Result Value Ref Range   Glucose-Capillary 206 (*) 70 - 99 mg/dL   Comment 1 Notify RN    GLUCOSE, CAPILLARY     Status: Abnormal   Collection Time    10/25/13  4:50 PM      Result Value Ref Range   Glucose-Capillary 227 (*) 70 - 99 mg/dL  GLUCOSE, CAPILLARY     Status: Abnormal   Collection Time    10/25/13  9:47 PM      Result Value Ref Range   Glucose-Capillary 128 (*) 70 - 99 mg/dL   Comment 1 Notify RN    GLUCOSE, CAPILLARY     Status: Abnormal   Collection Time    10/26/13  7:41 AM      Result Value Ref Range   Glucose-Capillary 167 (*) 70 - 99 mg/dL  GLUCOSE, CAPILLARY     Status: Abnormal   Collection Time    10/26/13 11:25 AM      Result Value Ref Range   Glucose-Capillary 173 (*) 70 - 99 mg/dL  GLUCOSE, CAPILLARY     Status: Abnormal   Collection Time    10/26/13  4:19 PM  Result Value Ref Range   Glucose-Capillary 172 (*) 70 - 99 mg/dL  GLUCOSE, CAPILLARY     Status: Abnormal   Collection Time    10/26/13  9:32 PM      Result Value Ref Range   Glucose-Capillary 260 (*) 70 - 99 mg/dL  BASIC METABOLIC PANEL     Status: Abnormal   Collection Time    10/27/13  4:53 AM      Result Value Ref Range   Sodium 128 (*) 137 - 147 mEq/L   Potassium 4.2  3.7 - 5.3 mEq/L   Chloride 88 (*) 96 - 112 mEq/L   CO2 27  19 - 32 mEq/L   Glucose, Bld 163 (*) 70 - 99 mg/dL   BUN 11  6 - 23 mg/dL   Creatinine, Ser 0.73  0.50 - 1.35 mg/dL   Calcium 8.4  8.4 - 10.5 mg/dL   GFR calc non Af Amer >90  >90 mL/min   GFR calc Af Amer >90  >90 mL/min   Comment: (NOTE)     The eGFR has been calculated using the CKD EPI equation.     This calculation has not been validated in all clinical situations.     eGFR's persistently <90 mL/min signify possible Chronic Kidney     Disease.  CBC     Status: Abnormal    Collection Time    10/27/13  4:53 AM      Result Value Ref Range   WBC 16.3 (*) 4.0 - 10.5 K/uL   RBC 3.83 (*) 4.22 - 5.81 MIL/uL   Hemoglobin 10.3 (*) 13.0 - 17.0 g/dL   HCT 32.5 (*) 39.0 - 52.0 %   MCV 84.9  78.0 - 100.0 fL   MCH 26.9  26.0 - 34.0 pg   MCHC 31.7  30.0 - 36.0 g/dL   RDW 20.2 (*) 11.5 - 15.5 %   Platelets 606 (*) 150 - 400 K/uL  GLUCOSE, CAPILLARY     Status: Abnormal   Collection Time    10/27/13  7:38 AM      Result Value Ref Range   Glucose-Capillary 232 (*) 70 - 99 mg/dL   Comment 1 Notify RN       HEENT: poor oral hygiene, left facial weakness Cardio: RRR and no  murmur Resp: CTA B/L and unlabored GI: BS positive and NT, ND Extremity:  Pulses positive and No Edema Skin:  No satellite lesions,, left butocks with Grade 2 decub, clean with eschar surrounded by granulation tissue Neuro: Alert/Oriented, Abnormal Motor 2-/5 R delt bi tri grip, 2- L HF, RIght side normal strength, Tone:  Increased Reduced LT sensation R foot Flexor tone LUE and Dysarthric Musc/Skel:  Other left AKA, now with occlusive dressing L AKA site Gen NAD Ext R foot warm, intrinsic muscle atrophy,no lesions  Assessment/Plan: 1. Functional deficits secondary to Left AKA /PAD which require 3+ hours per day of interdisciplinary therapy in a comprehensive inpatient rehab setting. Physiatrist is providing close team supervision and 24 hour management of active medical problems listed below. Physiatrist and rehab team continue to assess barriers to discharge/monitor patient progress toward functional and medical goals. Limited participation due to multiple medical and skin issues Team conf in am FIM: FIM - Bathing Bathing Steps Patient Completed: Chest;Left Arm;Abdomen;Right upper leg;Front perineal area;Right lower leg (including foot) Bathing: 3: Mod-Patient completes 5-7 15f 10 parts or 50-74%  FIM - Upper Body Dressing/Undressing Upper body dressing/undressing steps patient completed:  Thread/unthread left  sleeve of pullover shirt/dress;Put head through opening of pull over shirt/dress;Pull shirt over trunk;Thread/unthread right sleeve of pullover shirt/dresss Upper body dressing/undressing: 5: Supervision: Safety issues/verbal cues FIM - Lower Body Dressing/Undressing Lower body dressing/undressing steps patient completed: Fasten/unfasten right shoe;Don/Doff right shoe Lower body dressing/undressing: 3: Mod-Patient completed 50-74% of tasks  FIM - Toileting Toileting steps completed by patient: Performs perineal hygiene Toileting: 1: Two helpers  FIM - Radio producer Devices: Recruitment consultant Transfers: 3-To toilet/BSC: Mod A (lift or lower assist)  FIM - Control and instrumentation engineer Devices: Arm rests Bed/Chair Transfer: 2: Bed > Chair or W/C: Max A (lift and lower assist);3: Sit > Supine: Mod A (lifting assist/Pt. 50-74%/lift 2 legs)  FIM - Locomotion: Wheelchair Distance: 100 Locomotion: Wheelchair: 0: Activity did not occur FIM - Locomotion: Ambulation Locomotion: Ambulation Assistive Devices: Engineer, agricultural Ambulation/Gait Assistance: 1: +2 Total assist (Mod w/ +2 for chair follow) Locomotion: Ambulation: 0: Activity did not occur  Comprehension Comprehension Mode: Auditory Comprehension: 5-Understands basic 90% of the time/requires cueing < 10% of the time  Expression Expression Mode: Verbal Expression: 5-Expresses complex 90% of the time/cues < 10% of the time  Social Interaction Social Interaction: 5-Interacts appropriately 90% of the time - Needs monitoring or encouragement for participation or interaction.  Problem Solving Problem Solving: 5-Solves basic problems: With no assist  Memory Memory: 5-Requires cues to use assistive device  Medical Problem List and Plan:  1. Functional deficits secondary to L-AKA due to PAD, some redness along suture line min drainage at midpoint, add  keflex 2. DVT Prophylaxis/Anticoagulation: Pharmaceutical: Lovenox  3. Pain Management: On oxycontin   '40mg'$  BID. Oxycodone 10 mg prn breakthrough pain.  4. Depressed Mood: continue cymbalta.   -still a major issue  5. Neuropsych: This patient is capable of making decisions on his own behalf.  6. HTN: Monitor every 8 hours. Lisinopril resumed today.  7. H/o rectal cancer: Has problems with rectal pain when constipated. D/c senna S, change to Senna 8. Chronic constipation: stools pasty. Suppository every 24 hours if no BM.  9. 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 Continue Lantus 5 units and will titrate to home as indicated 10. Iron deficiency anemia: Loaded with IV iron. Continue po supplement. Check orthostatics, stool for OB, consider transfusion if symptomatic 11. H/o CVA with left spastic hemiparesis: Continue aggrenox.  12. Skin: Fungal infection clearing , Right buttocks improving but Left buttocks is not, because of Left hemiplegia, pt tends to lay on Left side, will Ask nsg to position pt on R side Added diflucan, continue local care per WOC recs. - WOC following, asked to have hydrotherapy at the end of the day so that extra pain medication can be given  -needs to be OOB as much as possible given sacral wounds, All meals in chair   LOS (Days) Talmo E 10/27/2013, 8:50 AM

## 2013-10-27 NOTE — Progress Notes (Signed)
Social Work Patient ID: Donald [name], male   DOB: [birth year], 68 y.o.   MRN: [id number] Met with pt to discuss how he is doing, he reports he hurts all of the time.  He and wife feel he is too much care for her, so want to Pursue short term NHP.  They would like [facility] in [place] fro their first choice.  Seems pt has some medical issues going on and will Ask MD when he would be medically ready.  Wife continues to provide support to pt.  Pt seems to have multiple issues going on which are impeding his Progress.  Provide support to pt and wife, will work on discharge plans.

## 2013-10-27 NOTE — Plan of Care (Signed)
Problem: RH Ambulation Goal: LTG Patient will ambulate in controlled environment (PT) LTG: Patient will ambulate in a controlled environment, # of feet with assistance (PT).  Outcome: Not Applicable Date Met:  72/09/19 Pt will not be ambulatory at time of D/C, therefore will D/C this goal.

## 2013-10-27 NOTE — Progress Notes (Addendum)
NUTRITION FOLLOW UP  Intervention:   -Vanilla milkshake made with Ensure complete daily at 2 pm  -Continue Prostat TID  -Magic cup at least TID (offer with meds), each supplement provides 290 kcal and 9 grams of protein  -Continue snacks between meals  -Consider appetite stimulant   Nutrition Dx:   Inadequate oral intake related to poor appetite, depression, constipation as evidenced by meal completion <50%. Ongoing.  Goal:   Patient will meet >/=90% of estimated nutrition needs, not met.   Monitor:   PO intake, weight, labs, wounds  Assessment:   68 y.o. right-handed male with history of CVA with left-sided weakness, HTN, PVD with multiple revascularization procedures as well his recent thrombectomy of occluded FPBG with 4 compartment fasciotomy requiring VAC 5/ 2015. He was d/c from CIR on 09/24/13 and was ambulating at supervision level with RW. He started developing increasing LLE pain with onset of numbness 10/07/13 due to recurrent thrombosis left femoral popliteal bypass. On 10/08/13, patient underwent L-AKA.   Patient continues to have severe depression.  Spoke with pt about the importance of nutrition to support wound healing. Pt with multiple reasons for decreased intake. Pt c/o constipation, painful to have bms, painful to clean up bms. Per RN pt now has rectal tube which was placed 6/29. Pt has not been drinking supplements, states he prefers ensure pudding and has been using this to take meds.  RN made pt milkshake yesterday and pt really liked it.    Sodium low.   Height: Ht Readings from Last 1 Encounters:  10/07/13 _0  (1.753 m)    Weight Status:   Wt Readings from Last 1 Encounters:  10/13/13 165 lb 10.4 oz (75.137 kg)    Re-estimated needs:  Kcal: 1800-2000  Protein: 90-100 gm  Fluid: 1.8-2.0 L  Skin: moisture associated skin damage (MASD) with suspected fungal overgrowth to perineum/gluteal cleft (9 open areas), left thigh incision  Diet Order:  Carb Control Meal Completion: 0-50%   Intake/Output Summary (Last 24 hours) at 10/27/13 1128 Last data filed at 10/27/13 0800  Gross per 24 hour  Intake    840 ml  Output    625 ml  Net    215 ml    Last BM: 6/29   Labs:   Recent Labs Lab 10/27/13 0453  NA 128*  K 4.2  CL 88*  CO2 27  BUN 11  CREATININE 0.73  CALCIUM 8.4  GLUCOSE 163*    CBG (last 3)   Recent Labs  10/26/13 1619 10/26/13 2132 10/27/13 0738  GLUCAP 172* 260* 232*    Scheduled Meds: . acetaminophen  650 mg Oral Once  . ALPRAZolam  0.25 mg Oral BID  . ALPRAZolam  0.5 mg Oral QHS  . cephALEXin  250 mg Oral 3 times per day  . collagenase   Topical Daily  . dipyridamole-aspirin  1 capsule Oral BID  . DULoxetine  30 mg Oral Daily  . enoxaparin (LOVENOX) injection  40 mg Subcutaneous Q24H  . feeding supplement (PRO-STAT SUGAR FREE 64)  30 mL Oral TID WC  . Gerhardt's butt cream   Topical BID  . insulin aspart  0-5 Units Subcutaneous QHS  . insulin aspart  0-9 Units Subcutaneous TID WC  . insulin glargine  5 Units Subcutaneous QHS  . lactose free nutrition  237 mL Oral BID BM  . lisinopril  2.5 mg Oral Daily  .  morphine injection  2 mg Intravenous QPC breakfast  . multivitamin with minerals  1 tablet Oral Daily  . nicotine  7 mg Transdermal Daily  . nystatin   Topical TID  . OxyCODONE  40 mg Oral Q12H  . saccharomyces boulardii  500 mg Oral BID  . senna-docusate  2 tablet Oral QHS  . traZODone  25 mg Oral QHS    Continuous Infusions: . sodium chloride      Maylon Peppers RD, LDN, CNSC [phone] Pager [phone] After Hours Pager

## 2013-10-27 NOTE — Progress Notes (Signed)
Physical Therapy Wound Treatment Patient Details  Name: Donald [name] MRN: [id number] Date of Birth: [birth year]  Today's Date: 10/27/2013 Time: 6010-9323 Time Calculation (min): 52 min  Subjective  Subjective: pt reported treatment was much less painful today Prior Treatments: "every kind of cream you can imagine"  Pain Score: 2/10 pre-treatment; pt sleeping at end of session  Wound Assessment  Clinical Statement: IV pain medicine and xanax prior to session made wound care much more tolerable for pt. Ultrasonic assisted with softening and removal of yellow adherent tissue (especially of largest wound). Red granulation buds now visible.   Wound / Incision (Open or Dehisced) 10/13/13 Other (Comment) Buttocks Left;Distal MSDA (Active)  Dressing Type Barrier Film (skin prep);Gauze (Comment);Moist to moist;Other (Comment);Tape dressing, Santyl 10/27/2013  3:51 PM  Dressing Changed Changed 10/27/2013  3:51 PM  Dressing Status Clean;Dry;Intact 10/27/2013  3:51 PM  Dressing Change Frequency Daily 10/27/2013  3:51 PM  Site / Wound Assessment Painful;Yellow;Brown;Granulation tissue;Pink 10/27/2013  3:51 PM  % Wound base Red or Granulating 5% 10/27/2013  3:51 PM  % Wound base Yellow 95% 10/27/2013  3:51 PM  % Wound base Black 0% 10/27/2013  3:51 PM  % Wound base Other (Comment) 0% 10/27/2013  3:51 PM  Peri-wound Assessment Erythema (non-blanchable);Pink;Excoriated 10/27/2013  3:51 PM  Wound Length (cm) 7.8 cm 10/22/2013  4:01 PM  Wound Width (cm) 5.7 cm 10/22/2013  4:01 PM  Wound Depth (cm) 0.2 cm 10/13/2013  5:12 PM  Margins Unattached edges (unapproximated) 10/27/2013  3:51 PM  Closure None 10/27/2013  3:51 PM  Drainage Amount Minimal 10/27/2013  3:51 PM  Drainage Description Serosanguineous 10/27/2013  3:51 PM  Non-staged Wound Description Partial thickness 10/27/2013  3:51 PM  Treatment Debridement (Selective);Hydrotherapy (Ultrasonic mist);Packing (Saline gauze) 10/27/2013  3:51 PM     Wound /  Incision (Open or Dehisced) 10/13/13 Other (Comment) Buttocks Left;Proximal MSDA; includes 9 open areas proximal to large buttock wound (Active)  Dressing Type Barrier Film (skin prep);Gauze (Comment);Moist to moist;Tape dressing, Santyl 10/27/2013  3:51 PM  Dressing Changed Changed 10/27/2013  3:51 PM  Dressing Status Clean;Dry;Intact 10/27/2013  3:51 PM  Dressing Change Frequency Daily 10/27/2013  3:51 PM  Site / Wound Assessment Painful;Pink;Yellow 10/27/2013  3:51 PM  % Wound base Red or Granulating 5% 10/27/2013  3:51 PM  % Wound base Yellow 95% 10/27/2013  3:51 PM  % Wound base Black 0% 10/27/2013  3:51 PM  % Wound base Other (Comment) 0% 10/27/2013  3:51 PM  Peri-wound Assessment Erythema (non-blanchable);Excoriated;Pink 10/27/2013  3:51 PM  Wound Length (cm) 6.5 cm 10/22/2013  4:01 PM  Wound Width (cm) 5.8 cm 10/22/2013  4:01 PM  Wound Depth (cm) 1 cm 10/13/2013  5:12 PM  Tunneling (cm) 0.2 10/13/2013  5:12 PM  Margins Unattached edges (unapproximated) 10/27/2013  3:51 PM  Closure None 10/27/2013  3:51 PM  Drainage Amount Minimal 10/27/2013  3:51 PM  Drainage Description Serosanguineous 10/27/2013  3:51 PM  Non-staged Wound Description Partial thickness 10/27/2013  3:51 PM  Treatment Debridement (Selective);Hydrotherapy (Ultrasonic mist);Packing (Saline gauze) 10/27/2013  3:51 PM      Hydrotherapy Ultrasonic mist  - wound location: Lt buttock  Ultrasonic mist at 35KHz (+/-3KHz) at ___ percent: 100 % Ultrasonic mist therapy minutes: 8 min Selective Debridement Selective Debridement - Location: Lt buttock wounds Selective Debridement - Tools Used: Forceps;Scalpel Selective Debridement - Tissue Removed: yellow, brown slough   Wound Assessment and Plan  Wound Therapy - Assess/Plan/Recommendations Wound Therapy - Clinical Statement: IV pain medicine  and xanax prior to session made wound care much more tolerable for pt. Ultrasonic assisted with softening and removal of yellow adherent tissue  (especially of largest wound). Red granulation buds now visible.  Wound Therapy - Functional Problem List: limiting all mobility per PT & OT due to pain Factors Delaying/Impairing Wound Healing: Diabetes Mellitus;Immobility;Multiple medical problems Hydrotherapy Plan: Debridement;Dressing change;Patient/family education;Ultrasonic wound therapy $RemoveBefor'@35'yhabIlEzdYDr$  KHz (+/- 3 KHz) Wound Therapy - Frequency: 6X / week Wound Therapy - Follow Up Recommendations: Home health RN Wound Plan: see above  Wound Therapy Goals- Improve the function of patient's integumentary system by progressing the wound(s) through the phases of wound healing (inflammation - proliferation - remodeling) by: Decrease Necrotic Tissue to: 70 (distal Lt buttock wound) Decrease Necrotic Tissue - Progress: Progressing toward goal Increase Granulation Tissue to: 30 (distal Lt buttock wound) Increase Granulation Tissue - Progress: Progressing toward goal  Goals will be updated until maximal potential achieved or discharge criteria met.  Discharge criteria: when goals achieved, discharge from hospital, MD decision/surgical intervention, no progress towards goals, refusal/missing three consecutive treatments without notification or medical reason.  GP     SASSER,LYNN 10/27/2013, 4:02 PM Pager [phone]

## 2013-10-27 NOTE — Progress Notes (Addendum)
Patient complains of dysuria. Has had poor po intake despite multiple attempts by staff and family. Reports stomach discomfort as well as fear of bowel incontinence. Labs checked today showing hyponatremia as well as elevated WBC. Will check UA/UCS. Discontinue diflucan (14 days today). Will treat with IVF and recheck labs in am. Will add prostat to help with nutritional needs--not drinking supplements consistently.

## 2013-10-27 NOTE — Progress Notes (Signed)
Occupational Therapy Session Note  Patient Details  Name: Donald [name] MRN: [id number] Date of Birth: Dec 11, 1945  Today's Date: 10/27/2013 Time: 3299-2426 Time Calculation (min): 52 min  Skilled Therapeutic Interventions/Progress Updates:    Worked on LUE PROM and stretching during session.  Pt with increased tightness in shoulder extensors and internal rotators, elbow flexors and digit flexors.  Pt with some AROM at the shoulder and elbow but is limited secondary to significant tone and synergy pattern.  Pt with decreased attempted functional use and needs hand over hand assist to integrate into tasks.  Discussed with pt/ wife about current amount of assist and slow progress at this time based on his medical status and buttocks wounds.  Will discuss with the rest of the team during conference tomorrow as well.   Therapy Documentation Precautions:  Precautions Precautions: Fall Precaution Comments: LT AKA wrap, pt is anxious with mobility, history of Left hemiparesis and increased tone., buttocks wounds  Restrictions Weight Bearing Restrictions: No  Pain: Pain Assessment Pain Assessment: Faces Pain Score: 2  Faces Pain Scale: Hurts little more Pain Type: Acute pain Pain Location: Buttocks Pain Orientation: Right;Left Pain Intervention(s): Repositioned Multiple Pain Sites: No ADL: See FIM for current functional status  Therapy/Group: Individual Therapy  MCGUIRE,JAMES OTR/L 10/27/2013, 4:03 PM

## 2013-10-28 ENCOUNTER — Inpatient Hospital Stay (HOSPITAL_COMMUNITY): Payer: Self-pay | Admitting: Rehabilitation

## 2013-10-28 ENCOUNTER — Inpatient Hospital Stay (HOSPITAL_COMMUNITY): Payer: Self-pay | Admitting: Physical Therapy

## 2013-10-28 ENCOUNTER — Ambulatory Visit (HOSPITAL_COMMUNITY): Payer: Self-pay

## 2013-10-28 ENCOUNTER — Encounter (HOSPITAL_COMMUNITY): Payer: Self-pay | Admitting: Occupational Therapy

## 2013-10-28 ENCOUNTER — Inpatient Hospital Stay (HOSPITAL_COMMUNITY): Payer: Self-pay | Admitting: Occupational Therapy

## 2013-10-28 LAB — CBC WITH DIFFERENTIAL/PLATELET
Basophils Absolute: 0 10*3/uL (ref 0.0–0.1)
Basophils Relative: 0 % (ref 0–1)
Eosinophils Absolute: 0.1 10*3/uL (ref 0.0–0.7)
Eosinophils Relative: 0 % (ref 0–5)
HEMATOCRIT: 33.4 % — AB (ref 39.0–52.0)
Hemoglobin: 10.5 g/dL — ABNORMAL LOW (ref 13.0–17.0)
LYMPHS PCT: 6 % — AB (ref 12–46)
Lymphs Abs: 1 10*3/uL (ref 0.7–4.0)
MCH: 26.6 pg (ref 26.0–34.0)
MCHC: 31.4 g/dL (ref 30.0–36.0)
MCV: 84.8 fL (ref 78.0–100.0)
MONOS PCT: 7 % (ref 3–12)
Monocytes Absolute: 1.2 10*3/uL — ABNORMAL HIGH (ref 0.1–1.0)
NEUTROS ABS: 13.6 10*3/uL — AB (ref 1.7–7.7)
Neutrophils Relative %: 87 % — ABNORMAL HIGH (ref 43–77)
Platelets: 609 10*3/uL — ABNORMAL HIGH (ref 150–400)
RBC: 3.94 MIL/uL — ABNORMAL LOW (ref 4.22–5.81)
RDW: 20.1 % — ABNORMAL HIGH (ref 11.5–15.5)
WBC: 15.8 10*3/uL — AB (ref 4.0–10.5)

## 2013-10-28 LAB — BASIC METABOLIC PANEL
BUN: 10 mg/dL (ref 6–23)
CHLORIDE: 88 meq/L — AB (ref 96–112)
CO2: 25 mEq/L (ref 19–32)
Calcium: 8.1 mg/dL — ABNORMAL LOW (ref 8.4–10.5)
Creatinine, Ser: 0.69 mg/dL (ref 0.50–1.35)
GFR calc non Af Amer: >90 mL/min (ref >90)
Glucose, Bld: 200 mg/dL — ABNORMAL HIGH (ref 70–99)
POTASSIUM: 4.1 meq/L (ref 3.7–5.3)
Sodium: 129 mEq/L — ABNORMAL LOW (ref 137–147)

## 2013-10-28 LAB — GLUCOSE, CAPILLARY
GLUCOSE-CAPILLARY: 177 mg/dL — AB (ref 70–99)
GLUCOSE-CAPILLARY: 136 mg/dL — AB (ref 70–99)
Glucose-Capillary: 156 mg/dL — ABNORMAL HIGH (ref 70–99)
Glucose-Capillary: 145 mg/dL — ABNORMAL HIGH (ref 70–99)

## 2013-10-28 LAB — URINE CULTURE

## 2013-10-28 NOTE — Progress Notes (Signed)
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  S/P AKA (above knee amputation)  10/13/2013   .  Uncontrolled pain  10/10/2013   .  Type II or unspecified type diabetes mellitus with peripheral circulatory disorders, uncontrolled(250.72)  10/10/2013   .  Protein-calorie malnutrition, severe  10/09/2013   .  Diabetes mellitus with circulatory complication  59/93/5701   .  HTN (hypertension)  10/07/2013   .  Anemia  10/07/2013   .  Ischemia of extremity  10/07/2013   .  Ischemic leg  09/05/2013   .  Wound discharge  04/09/2013   .  Hemiparesis affecting left side as late effect of stroke  01/05/2013   .  Abnormality of gait  09/03/2012   .  Other acquired deformity of ankle and foot(736.79)  09/03/2012   .  Aftercare following surgery of the circulatory system, [place]  05/15/2012   .  Atherosclerosis of native arteries of the extremities with ulceration(440.23)  04/10/2012   .  Peripheral vascular disease, unspecified  03/06/2012   .  PVD (peripheral vascular disease)  02/21/2012   .  Occlusion and stenosis of carotid artery without mention of cerebral infarction  02/21/2012   .  Physical deconditioning  01/31/2012   .  Atherosclerotic peripheral vascular disease with rest pain  01/24/2012   .   Preoperative evaluation to rule out surgical contraindication  01/24/2012   .  Anal carcinoma  10/30/2011   .  Anal cancer  02/26/2011   .  [place] MALIGNANT NEOPLASM OF LARGE INTESTINE&RECTUM  11/09/2008   .  NAUSEA AND VOMITING  11/09/2008   .  CONSTIPATION  08/18/2008   .  RECTAL BLEEDING  08/18/2008   .  ABDOMINAL PAIN -GENERALIZED  08/18/2008   .  DM  08/17/2008   .  HYPERLIPIDEMIA  08/17/2008   .  HEMORRHOIDS  08/17/2008   .  CONSTIPATION, CHRONIC  08/17/2008   .  CEREBROVASCULAR ACCIDENT, HX OF  08/17/2008     Expected Discharge Date: Expected Discharge Date: 10/27/13  Team Members Present: Physician leading conference: Dr. Alysia Penna Social Worker Present: Ovidio Kin, LCSW Nurse Present: Rayetta Humphrey, RN PT Present: Cameron Sprang, PT OT Present: Clyda Greener, OT SLP Present: Gunnar Fusi, SLP PPS Coordinator present : Ileana Ladd, Lelan Pons, RN, CRRN        Current Status/Progress  Goal  Weekly Team Focus   Medical     poor tolerance of therapy, wound infection  treat wound infection  post rehab environment   Bowel/Bladder     condom cath on at [facility], [place] in place for bowel  To be continent of B&B  Timed toileting during day   Swallow/Nutrition/  Hydration     wfl       ADL's     Min assist to supervision for UB selfcare, max assist sit to stand for LB selfcare.  Max assist for toilet transfers and toleting.  Decreased standing tolerance to approximately 10 seconds with selfcare tasks.    min assist bathing and dressing and supervision for toileting  selfcare re-training, balance re-training, pain managemen, safety, use of DME, pt/family education, neuro re-education   Mobility     Pt currently min A for bed mobility with use of handrail, max A for squat pivot transfers, max to total A for very short standing.  continues to be severely limited by pain in buttocks, pain in L residual limb, and decreased motivation to participate.   downgraded to max A  transfers and S w/c level  transfers, strnegthening, sitting balance, endurance, pain management.    Communication     wfl       Safety/Cognition/ Behavioral Observations    no unsafe behaviors       Pain     C/o of pain, OxyContin 40mg  po. sch.  Pain <5  Assess for and treat pain q shift    Skin     Buttocks reddened   No new breakdown  Apply butt cream as orderd     *See Care Plan and progress notes for long and short-term goals.    Barriers to Discharge:  multiple medical issues      Possible Resolutions to Barriers:    LTAC eval      Discharge Planning/Teaching Needs:    With the care pt is requiring both feel wife can not provide the care he needs, therefore plan has changed to NHP when medically ready      Team Discussion:    Multiple medical issues-being treated now with IV antibiotics.  Continue to work on his skin issues-hydrotherapy daily. QD therapies due to lack of progress-regressing in his function due to above issues.  Will discuss LTACH with pt and wife.   Revisions to Treatment Plan:    LTACH/NHP. QD therapies    Continued Need for Acute Rehabilitation Level of Care: The patient requires daily medical management by a physician with specialized training in physical medicine and rehabilitation for the following conditions: Daily direction of a multidisciplinary physical rehabilitation program to ensure safe treatment while eliciting the highest outcome that is of practical value to the patient.: Yes Daily medical management of patient stability for increased activity during participation in an intensive rehabilitation regime.: Yes Daily analysis of laboratory values and/or radiology reports with any subsequent need for medication adjustment of medical intervention for : Other  Elease Hashimoto 10/28/2013, 3:23 PM         Elease Hashimoto, LCSW Social Worker Signed  Patient Care Conference Service date: 10/21/2013 1:17 PM  Inpatient RehabilitationTeam Conference  and Plan of Care Update Date: 10/21/2013   Time: 11:15 AM     Patient Name: Donald [name]       Medical Record Number: [id number]   Date of Birth: Jan 01, 1946 Sex: Male         Room/Bed: 4W23C/4W23C-01 Payor Info: Payor: MEDICARE / Plan: MEDICARE PART A AND B / Product Type: *No Product type* /   Admitting Diagnosis: L AKA OLD CVA   Admit Date/Time:  10/13/2013  4:55 PM Admission Comments: No comment available   Primary Diagnosis:  <principal problem not specified> Principal Problem: <principal problem not specified>    Patient  Active Problem List     Diagnosis  Date Noted   .  S/P AKA (above knee amputation)  10/13/2013   .  Uncontrolled pain  10/10/2013   .  Type II or unspecified type diabetes mellitus with peripheral circulatory disorders, uncontrolled(250.72)  10/10/2013   .  Protein-calorie malnutrition, severe  10/09/2013   .  Diabetes mellitus with circulatory complication  09/62/8366   .  HTN (hypertension)  10/07/2013   .  Anemia  10/07/2013   .  Ischemia of extremity  10/07/2013   .  Ischemic leg  09/05/2013   .  Wound discharge  04/09/2013   .  Hemiparesis affecting left side as late effect of stroke  01/05/2013   .  Abnormality of gait  09/03/2012   .  Other acquired deformity of ankle and foot(736.79)  09/03/2012   .  Aftercare following surgery of the circulatory system, [place]  05/15/2012   .  Atherosclerosis of native arteries of the extremities with ulceration(440.23)  04/10/2012   .  Peripheral vascular disease, unspecified  03/06/2012   .  PVD (peripheral vascular disease)  02/21/2012   .  Occlusion and stenosis of carotid artery without mention of cerebral infarction  02/21/2012   .  Physical deconditioning  01/31/2012   .  Atherosclerotic peripheral vascular disease with rest pain  01/24/2012   .  Preoperative evaluation to rule out surgical contraindication  01/24/2012   .  Anal carcinoma  10/30/2011   .  Anal cancer  02/26/2011   .  [place] MALIGNANT  NEOPLASM OF LARGE INTESTINE&RECTUM  11/09/2008   .  NAUSEA AND VOMITING  11/09/2008   .  CONSTIPATION  08/18/2008   .  RECTAL BLEEDING  08/18/2008   .  ABDOMINAL PAIN -GENERALIZED  08/18/2008   .  DM  08/17/2008   .  HYPERLIPIDEMIA  08/17/2008   .  HEMORRHOIDS  08/17/2008   .  CONSTIPATION, CHRONIC  08/17/2008   .  CEREBROVASCULAR ACCIDENT, HX OF  08/17/2008     Expected Discharge Date: Expected Discharge Date: 10/27/13  Team Members Present: Physician leading conference: Dr. Alysia Penna Social Worker Present: Ovidio Kin, LCSW Nurse Present: Elliot Cousin, RN PT Present: Georjean Mode, PT;Blair Hobble, PT OT Present: Antony Salmon, OT SLP Present: Other (comment) Elmyra Ricks Page-SP) PPS Coordinator present : Daiva Nakayama, RN, CRRN        Current Status/Progress  Goal  Weekly Team Focus   Medical     pain in buttocks  improve pain control  increase prn Oxy Dose   Bowel/Bladder     Continent of bladder/ incont epiisodes of bowel   continent of B+B  Asses for need for supp and Q2hr toileting    Swallow/Nutrition/ Hydration     wfl       ADL's     min assist for UB selfcare and mod to max assist for LB selfcare sit to stand.  Increased buttocks pain with all movement, especially scooting tasks.  Mod assist for toilet transfers with max assist for clothing management.  He is able to manage his own hygiene in sitting with supervision  min assist bathing and dressing and supervision for toileting  selfcare re-training, balance re-training, pain management, safety, use of DME, pt/family educaiton, neuro re-ed   Mobility     S to min A for bed mobility with use of handrail, mod A for stand/squat pivot transfers, mod A for standing, limited activity tolerance, continues to have increased pain in  buttocks, limiting therapy.  he has taken several hops with L PFRW, but continues to need max encouragement from therapist.   S to Mod I at w/c level.   transfers, strengthening, balance,  endurance, pain management, gait   Communication     na       Safety/Cognition/ Behavioral Observations    Pt adheres to safety precautions and safety plan  Pt to remain compliant with safety plan/ precautions  Reorient pt to safety plan and precautions PRN   Pain     Pain uncontrolled; scheduled oxycontin  Pain at/ below level 5  Assess pain at beigging of shift and PRN   Skin     Buttocks with MASD and pressure breakdown on sacrum  Wound healing  Continue treatment of [name] and powder PRN     *See Care Plan and progress notes for long and short-term goals.    Barriers to Discharge:  skin issues, pain      Possible Resolutions to Barriers:    cont aggressive wound care      Discharge Planning/Teaching Needs:    Home with wife who has assisted with pt's care prior to admission-here daily and provides emotional support.  Pt being seen by neuro-psych and sw for support      Team Discussion:    Pt not progressing as thought would, mod assist still.  Slow progress.  Skin issues hindering his participation and progress.  Will discuss with wife the plan.  Pain issues.  Neuro-psych seeing   Revisions to Treatment Plan:    May change discharge plan-discuss with wife    Continued Need for Acute Rehabilitation Level of Care: The patient requires daily medical management by a physician with specialized training in physical medicine and rehabilitation for the following conditions: Daily direction of a multidisciplinary physical rehabilitation program to ensure safe treatment while eliciting the highest outcome that is of practical value to the patient.: Yes Daily medical management of patient stability for increased activity during participation in an intensive rehabilitation regime.: Yes Daily analysis of laboratory values and/or radiology reports with any subsequent need for medication adjustment of medical intervention for : Neurological problems;Post surgical problems  Medard Decuir,  Gardiner Rhyme 10/21/2013, 1:17 PM         Elease Hashimoto, LCSW Social Worker Signed  Patient Care Conference Service date: 10/14/2013 4:10 PM  Inpatient RehabilitationTeam Conference and Plan of Care Update Date: 10/14/2013   Time: 10;50 AM     Patient Name: Donald [name]       Medical Record Number: [id number]   Date of Birth: 10-Apr-1946 Sex: Male         Room/Bed: 4W23C/4W23C-01 Payor Info: Payor: MEDICARE / Plan: MEDICARE PART A AND B / Product Type: *No Product type* /   Admitting Diagnosis: 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  S/P AKA (above knee amputation)  10/13/2013   .  Uncontrolled pain  10/10/2013   .  Type II or unspecified type diabetes mellitus with peripheral circulatory disorders, uncontrolled(250.72)  10/10/2013   .  Protein-calorie malnutrition, severe  10/09/2013   .  Diabetes mellitus with circulatory complication  41/66/0630   .  HTN (hypertension)  10/07/2013   .  Anemia  10/07/2013   .  Ischemia of extremity  10/07/2013   .  Ischemic leg  09/05/2013   .  Wound discharge  04/09/2013   .  Hemiparesis affecting left side as late effect of stroke  01/05/2013   .  Abnormality of gait  09/03/2012   .  Other acquired deformity of ankle and foot(736.79)  09/03/2012   .  Aftercare following surgery of the circulatory system, [place]  05/15/2012   .  Atherosclerosis of native arteries of the extremities with ulceration(440.23)  04/10/2012   .  Peripheral vascular disease, unspecified  03/06/2012   .  PVD (peripheral vascular disease)  02/21/2012   .  Occlusion and stenosis of carotid artery without mention of cerebral infarction  02/21/2012   .  Physical deconditioning  01/31/2012   .  Atherosclerotic peripheral vascular disease with rest  pain  01/24/2012   .  Preoperative evaluation to rule out surgical contraindication  01/24/2012   .  Anal carcinoma  10/30/2011   .  Anal cancer  02/26/2011   .  [place] MALIGNANT NEOPLASM OF LARGE INTESTINE&RECTUM  11/09/2008   .  NAUSEA AND VOMITING  11/09/2008   .  CONSTIPATION  08/18/2008   .  RECTAL BLEEDING  08/18/2008   .  ABDOMINAL PAIN -GENERALIZED  08/18/2008   .  DM  08/17/2008   .  HYPERLIPIDEMIA  08/17/2008   .  HEMORRHOIDS  08/17/2008   .  CONSTIPATION, CHRONIC  08/17/2008   .  CEREBROVASCULAR ACCIDENT, HX OF  08/17/2008     Expected Discharge Date: Expected Discharge Date: 10/27/13  Team Members Present: Physician leading conference: Dr. Alysia Penna Social Worker Present: Ovidio Kin, LCSW Nurse Present: Elliot Cousin, RN PT Present: Cameron Sprang, PT OT Present: Antony Salmon, OT SLP Present: Other (comment);Gunnar Fusi, SLP Elmyra Ricks Page-SP) PPS Coordinator present : Ileana Ladd, Lelan Pons, RN, [facility]        Current Status/Progress  Goal  Weekly Team Focus   Medical     depressed on meds seen by Psych  maintain positive outlook  skin management   Bowel/Bladder     Continent of bowel and bladder, LBM 10/08/2013, Senna S 2 tab bid, Colace 100mg  biId  remain continent of bowel and bladder, remain free of skin break down and infection  Assess routine bowel movements daily, treat constipation.   Swallow/Nutrition/ Hydration     wfl       ADL's     eval pending       Mobility     eval pending       Communication     WFL       Safety/Cognition/ Behavioral Observations    no unsafe behaviors       Pain     Pain managed with Oxy IR 10 mg  Pain <3  Assess pain q shift and prn, keep pt pain level at or < 3   Skin     monitoring skin-mattress over lay for bed  WOC eval  provide skin dressing and care     *See Care Plan and progress notes for long and short-term goals.    Barriers to Discharge:  skin issues, potential for open lesions       Possible Resolutions to Barriers:    cont local and systemic care of peri rash      Discharge Planning/Teaching Needs:    Home with wife who can provide assistance and was prior to admission    Team  Discussion:    Skin issues has overlay mattress. New eval in all areas.  Sleeping better, would benefit from Neuro-psych seeing him while here.   Revisions to Treatment Plan:    New eval    Continued Need for Acute Rehabilitation Level of Care: The patient requires daily medical management by a physician with specialized training in physical medicine and rehabilitation for the following conditions: Daily direction of a multidisciplinary physical rehabilitation program to ensure safe treatment while eliciting the highest outcome that is of practical value to the patient.: Yes Daily medical management of patient stability for increased activity during participation in an intensive rehabilitation regime.: Yes Daily analysis of laboratory values and/or radiology reports with any subsequent need for medication adjustment of medical intervention for : Neurological problems;Other;Post surgical problems  Elease Hashimoto 10/15/2013, 8:42 AM          Patient ID: [name], male   DOB: August 13, 1945, 68 y.o.   MRN: [id number]

## 2013-10-28 NOTE — Progress Notes (Signed)
Physical Therapy Weekly Progress Note  Patient Details  Name: Donald [name] MRN: [id number] Date of Birth: 1946/04/09  Beginning of progress report period: October 21, 2013 End of progress report period: October 28, 2013  Today's Date: 10/28/2013 Time: 5093-2671 Time Calculation (min): 32 min  Patient has met 0 of 5 short term goals.  Pt not progressing with therapies and has actually shown regression with ability to perform most mobility tasks.  Requires max A for squat pivot transfers, is unable to stand without total A due to increased anxiety, weakness and tone/resistance; min A for bed mobility with use of bed rails and is unable to self propel w/c for more than 20-45' at a time due to increased fatigue.  Continue to feel that pt will need SNF level care at time of D/C, however have seen vascular MD's notes regarding possible need for higher amputation/hip disarticulation.   Patient continues to demonstrate the following deficits: decreased balance, decreased endurance, increased pain, increased tone/anxiety/resistance to movement and therefore will continue to benefit from skilled PT intervention to enhance overall performance with activity tolerance, balance, postural control, ability to compensate for deficits, attention, awareness, coordination and knowledge of precautions.  Patient not progressing toward long term goals.  See goal revision..  Plan of care revisions: See downgraded LTG's. .  PT Short Term Goals Week 2:  PT Short Term Goal 1 (Week 2): Pt will perform bed mobility with S with HOB flat and without bed rails PT Short Term Goal 1 - Progress (Week 2): Progressing toward goal PT Short Term Goal 2 (Week 2): Pt will perform dynamic standing x 3 mins at min A level with RUE supported PT Short Term Goal 2 - Progress (Week 2): Not progressing PT Short Term Goal 3 (Week 2): Pt will self propel w/c x 150' using R hemi technique at S level PT Short Term Goal 3 - Progress (Week 2):  Progressing toward goal PT Short Term Goal 4 (Week 2): Pt will verbalize pressure relief techniques with min cues PT Short Term Goal 4 - Progress (Week 2): Progressing toward goal PT Short Term Goal 5 (Week 2): Pt will perform stand/squat pivot transfer with min A.  PT Short Term Goal 5 - Progress (Week 2): Not progressing Week 3:  PT Short Term Goal 1 (Week 3): =LTG's (downgraded due to lack of progress)  Skilled Therapeutic Interventions/Progress Updates:   Pt received lying in bed, agreeable to therapy.  Upon getting ready to roll, checked brief to ensure no feces in brief, however did note feces in brief, therefore performed several reps of rolling R and L with S and use of handrail in order to assist with cleaning.  Also note area of drainage from L residual limb, therefore RN called to room in order to assess and re-wrap.  Maintained area of packing, however re-donned abd pads and kerlex.  Left pt on R side to allow wound to air dry in preparation for hydro therapy this afternoon.  Did not attempt further therapy (missed 13 mins) to avoid strain>to avoid feces leaking around rectal tube.  Pt with all needs in reach.    Therapy Documentation Precautions:  Precautions Precautions: Fall Precaution Comments: LT AKA wrap, pt is anxious with mobility, history of Left hemiparesis and increased tone., buttocks wounds  Restrictions Weight Bearing Restrictions: No    Pain: c/o pain, however will be getting pain meds again prior to hydrotherapy session.  See FIM for current functional status  Therapy/Group: Individual  Therapy  Denice Bors 10/28/2013, 2:21 PM

## 2013-10-28 NOTE — Progress Notes (Signed)
Occupational Therapy Session Note  Patient Details  Name: Donald [name] MRN: [id number] Date of Birth: [birth year]  Today's Date: 10/28/2013 Time: 0901-0950 Time Calculation (min): 49 min  Short Term Goals: Week 2:  OT Short Term Goal 1 (Week 2): LTGs equal to min assist level to supervision.  Skilled Therapeutic Interventions/Progress Updates:    Pt performed transfer to the edge of the bed with max assist and then squat pivot to the chair with the same amount of assistance.  Pt with leaking flexiseal during session as well.  He performed UB bathing with min assist from the wheelchair level and then UB dressing with supervision.  Pt needs total hand over hand assistance to wash the right arm with the LUE.  Total assist +2 (pt 30%) for sit to stand in order to perform peri hygiene.  Pt only able to stand for 20-30 second intervals before stating he needed to sit secondary to RLE pain.  Performed 2 intervals of standing.  Pt transferred back to bed for nursing to clean his wounds and re-dress.  Noted amputation also oozing redish brown liquid.    Therapy Documentation Precautions:  Precautions Precautions: Fall Precaution Comments: LT AKA wrap, pt is anxious with mobility, history of Left hemiparesis and increased tone., buttocks wounds  Restrictions Weight Bearing Restrictions: No  Pain: Pain Assessment Pain Assessment: Faces Faces Pain Scale: Hurts little more Pain Type: Acute pain Pain Location: Buttocks Pain Intervention(s): Repositioned;Emotional support ADL: See FIM for current functional status  Therapy/Group: Individual Therapy  Qamar Aughenbaugh OTR/L 10/28/2013, 12:02 PM

## 2013-10-28 NOTE — Progress Notes (Signed)
Occupational Therapy Session Note  Patient Details  Name: Donald [name] MRN: [id number] Date of Birth: 1946-03-30  Today's Date: 10/28/2013 Time: 1300-1330 Time Calculation (min): 30 min  Short Term Goals: Week 2:  OT Short Term Goal 1 (Week 2): LTGs equal to min assist level to supervision.  Skilled Therapeutic Interventions/Progress Updates:    Pt performed shaving sitting at the sink to begin session.  He needed supervision to perform task.  Noted stump wrap on the LLE was saturated with blood on the medial aspect so pt transferred back to bed for nursing to change dressing.  He needed max assist for squat pivot for transfer from the wheelchair to the edge of the bed.  Pt positioned on his right side as therapist assisted with cleaning peri area.  Nursing notified of need for dressing change.   Therapy Documentation Precautions:  Precautions Precautions: Fall Precaution Comments: LT AKA wrap, pt is anxious with mobility, history of Left hemiparesis and increased tone., buttocks wounds  Restrictions Weight Bearing Restrictions: No  Pain:  Pt reports pain 6/10 in his buttocks ADL: See FIM for current functional status  Therapy/Group: Individual Therapy  Ahmeer Tuman OTR/L 10/28/2013, 4:12 PM

## 2013-10-28 NOTE — Progress Notes (Signed)
Physical Therapy Note  Patient Details  Name: Donald [name] MRN: [id number] Date of Birth: Sep 19, 1945 Today's Date: 10/28/2013  Time: 1115 - 1200 45 minutes  1:1, c/o pain in LLE during mobility, RN aware, rest and repositioning as appropriate.  Pt received supine in bed, HOB elevated.  Total A to lift LLE to don cloth cover for residual limb.  Upon rolling to don pants, noted feces coming out around [place], discussed further treatment with RN before cleaning.  Multiple B rolling for peri care, hygiene, and to don brief.  Stand pivot transfer bed <> w/c with max A.  Pt propelled w/c 25' in controlled environment with supervision.  C/o "burning" pain in LLE and returned to room 25' in w/c with mod A.  Pt left sitting in w/c with all needs met and in reach.    Kenn File 10/28/2013, 12:15 PM

## 2013-10-28 NOTE — Consult Note (Signed)
WOC reviewed chart and updated wound care orders for the stump wound based on Dr. Stephens Shire note and updated wound care orders for the buttocks and sacrum. Appears from PT notes ultrasonic mist therapy was more successful in clearing the necrotic tissue away from the wounds on the buttocks/sacrum, and pt tolerated with use of IV pain meds and meds for anxiety prior to treatment.  WOC will continue to follow along with you for wound assessment and update to wound care orders as needed [facility] RN,CWOCN [phone]

## 2013-10-28 NOTE — Progress Notes (Signed)
Reviewed and in agreement with treatment provided.  

## 2013-10-28 NOTE — Progress Notes (Signed)
Physical Therapy Wound Treatment Patient Details  Name: CONRADO NANCE MRN: [id number] Date of Birth: 03-Jan-1946  Today's Date: 10/28/2013 Time: 1300-1330 Time Calculation (min): 30 min  Subjective  Subjective: Please let me take a break...yes it hurts... Patient and Family Stated Goals: decr pain and heal wound  Pain Score:    Wound Assessment  Negative Pressure Wound Therapy Leg Left;Anterior (Active)     Wound / Incision (Open or Dehisced) 10/13/13 Other (Comment) Buttocks Left;Distal MSDA (Active)  Dressing Type Barrier Film (skin prep);Gauze (Comment);Moist to dry;ABD 10/28/2013  4:00 PM  Dressing Changed Changed 10/28/2013  4:00 PM  Dressing Status Clean;Dry 10/28/2013  4:00 PM  Dressing Change Frequency Daily 10/28/2013  4:00 PM  Site / Wound Assessment Painful 10/28/2013  4:00 PM  % Wound base Red or Granulating 5% 10/28/2013  4:00 PM  % Wound base Yellow 95% 10/28/2013  4:00 PM  % Wound base Black 0% 10/28/2013  4:00 PM  % Wound base Other (Comment) 0% 10/28/2013  4:00 PM  Peri-wound Assessment Erythema (non-blanchable);Pink;Excoriated 10/28/2013  4:00 PM  Wound Length (cm) 7.8 cm 10/22/2013  4:01 PM  Wound Width (cm) 5.7 cm 10/22/2013  4:01 PM  Wound Depth (cm) 0.2 cm 10/13/2013  5:12 PM  Margins Unattached edges (unapproximated) 10/28/2013  4:00 PM  Closure None 10/28/2013  4:00 PM  Drainage Amount Minimal 10/28/2013  4:00 PM  Drainage Description Serosanguineous 10/28/2013  4:00 PM  Non-staged Wound Description Partial thickness 10/28/2013  4:00 PM  Treatment Cleansed;Hydrotherapy (Ultrasonic mist);Debridement (Selective) 10/28/2013  4:00 PM     Wound / Incision (Open or Dehisced) 10/13/13 Other (Comment) Buttocks Left;Proximal MSDA; includes 9 open areas proximal to large buttock wound (Active)  Dressing Type Other (Comment) 10/28/2013  1:04 PM  Dressing Changed Changed 10/28/2013  4:00 PM  Dressing Status Clean;Dry 10/28/2013  4:00 PM  Dressing Change Frequency Daily 10/28/2013  4:00 PM  Site / Wound  Assessment Painful 10/28/2013  4:00 PM  % Wound base Red or Granulating 5% 10/28/2013  4:00 PM  % Wound base Yellow 95% 10/28/2013  4:00 PM  % Wound base Black 0% 10/28/2013  4:00 PM  % Wound base Other (Comment) 0% 10/28/2013  4:00 PM  Peri-wound Assessment Erythema (non-blanchable);Excoriated;Pink 10/28/2013  4:00 PM  Wound Length (cm) 6.5 cm 10/22/2013  4:01 PM  Wound Width (cm) 5.8 cm 10/22/2013  4:01 PM  Wound Depth (cm) 1 cm 10/13/2013  5:12 PM  Tunneling (cm) 0.2 10/13/2013  5:12 PM  Margins Unattached edges (unapproximated) 10/28/2013  4:00 PM  Closure None 10/28/2013  4:00 PM  Drainage Amount Minimal 10/28/2013  4:00 PM  Drainage Description Serosanguineous 10/28/2013  4:00 PM  Non-staged Wound Description Partial thickness 10/28/2013  4:00 PM  Treatment Cleansed;Debridement (Selective);Hydrotherapy (Ultrasonic mist) 10/28/2013  4:00 PM     Incision (Closed) 10/08/13 Thigh Left (Active)  Dressing Type Gauze (Comment) 10/28/2013  1:04 PM  Dressing Reinforced;Clean;Dry;Intact 10/28/2013  1:04 PM  Dressing Change Frequency Daily 10/28/2013  1:04 PM  Site / Wound Assessment Clean;Dry;Bleeding 10/28/2013  1:04 PM  Margins Unattached edges (unapproximated) 10/28/2013  1:04 PM  Closure Staples 10/23/2013  8:45 PM  Drainage Amount Scant 10/28/2013  1:04 PM  Drainage Description Serosanguineous;No odor 10/28/2013  1:04 PM  Treatment Cleansed;Other (Comment) 10/20/2013  8:30 AM   Hydrotherapy Ultrasonic mist  - wound location: Lt buttock  Ultrasonic mist at 35KHz (+/-3KHz) at ___ percent: 100 % (had to decrease to 50% for contact) Ultrasonic mist therapy minutes: 5 min (  pt unable to tolerate contact of the ultrasonic head today) [facility] Debridement Selective Debridement - Location: Lt buttock wounds Selective Debridement - Tools Used: Forceps;Scalpel Selective Debridement - Tissue Removed: yellow, brown slough (and scored to increase surface area)   Wound Assessment and Plan  Wound Therapy -  Assess/Plan/Recommendations Wound Therapy - Clinical Statement: Small amount of budding visible, but wounds still largely coved by a tenacious layer of eschar.  Pt unable to tolerate the [place] mist today.  Maybe the pain meds and schedule were not timed well today. Wound Therapy - Functional Problem List: limiting all mobility per PT & OT due to pain Factors Delaying/Impairing Wound Healing: Diabetes Mellitus;Immobility;Multiple medical problems Hydrotherapy Plan: Debridement;Dressing change;Patient/family education;Ultrasonic wound therapy $RemoveBefor'@35'[id number]$  KHz (+/- 3 KHz) Wound Therapy - Frequency: 6X / week Wound Therapy - Follow Up Recommendations: Home health RN  Wound Therapy Goals- Improve the function of patient's integumentary system by progressing the wound(s) through the phases of wound healing (inflammation - proliferation - remodeling) by: Decrease Necrotic Tissue to: 70 Decrease Necrotic Tissue - Progress: Progressing toward goal Increase Granulation Tissue to: 30 Increase Granulation Tissue - Progress: Progressing toward goal Goals/treatment plan/discharge plan were made with and agreed upon by patient/family: Yes Time For Goal Achievement: 7 days Wound Therapy - Potential for Goals: Good  Goals will be updated until maximal potential achieved or discharge criteria met.  Discharge criteria: when goals achieved, discharge from hospital, MD decision/surgical intervention, no progress towards goals, refusal/missing three consecutive treatments without notification or medical reason.  GP     Edlin Ford, Neng Albee 10/28/2013, 4:13 PM  10/28/2013  Donnella Sham, [place] [phone]  (pager)

## 2013-10-28 NOTE — Progress Notes (Signed)
Social Work Patient ID: Donald [name], male   DOB: 04/04/1946, 68 y.o.   MRN: [id number] Met with pt and wife to discuss team conference and concerns.  Aware of his multiple medical issues and are concerned regarding this. Wife unable to provide the care pt needs so plan is either LTACH or NHP.  Discussed LTACH and both are willing for worker to make referral and See if he is eligible.  He will need the close medical follow with the multiple issues he has and they are addressing.  Pt states: " It is the pain and everything, one thing after another." Wife is supportive and wants to provide care pt with his current level she can't.  Will contact [facility] and see if they will evaluate for their services.

## 2013-10-28 NOTE — Progress Notes (Signed)
Subjective/Complaints: 68 y.o. right-handed male with history of CVA with left-sided weakness, HTN, PVD with multiple revascularization procedures as well his recent thrombectomy of occluded FPBG with 4 compartment fasciotomy requiring VAC 5/ 2015. He was d/c from CIR on 09/24/13 and was ambulating at supervision level with RW. He started developing increasing LLE pain with onset of numbness 10/07/13 due to recurrent thrombosis left femoral popliteal bypass. Patient has refused chronic coumadin therapy as well as removal of infected L-axillofemoral BG. High L-AKA recommended due to level of demarcation. On 10/08/13, patient underwent L-AKA by Dr. Trula Slade and post op with severe depressive symptoms with lability as well as poor pain control. He was started on Oxycontin for more pain management as well as Toradol. Was treated with IV iron infusion for anemia. Pschiatry consulted for input and patient started on Cymbalta for severe acute depressive symptoms  Appreciate VVS note No pain L stump Still has rectal tube Review of Systems - Negative except Left buttocks pain  Objective: Vital Signs: Blood pressure 124/80, pulse 94, temperature 97.7 F (36.5 C), temperature source Oral, resp. rate 18, weight 75.137 kg (165 lb 10.4 oz), SpO2 90.00%. No results found. Results for orders placed during the hospital encounter of 10/13/13 (from the past 72 hour(s))  GLUCOSE, CAPILLARY     Status: Abnormal   Collection Time    10/25/13 11:12 AM      Result Value Ref Range   Glucose-Capillary 206 (*) 70 - 99 mg/dL   Comment 1 Notify RN    GLUCOSE, CAPILLARY     Status: Abnormal   Collection Time    10/25/13  4:50 PM      Result Value Ref Range   Glucose-Capillary 227 (*) 70 - 99 mg/dL  GLUCOSE, CAPILLARY     Status: Abnormal   Collection Time    10/25/13  9:47 PM      Result Value Ref Range   Glucose-Capillary 128 (*) 70 - 99 mg/dL   Comment 1 Notify RN    GLUCOSE, CAPILLARY     Status: Abnormal    Collection Time    10/26/13  7:41 AM      Result Value Ref Range   Glucose-Capillary 167 (*) 70 - 99 mg/dL  GLUCOSE, CAPILLARY     Status: Abnormal   Collection Time    10/26/13 11:25 AM      Result Value Ref Range   Glucose-Capillary 173 (*) 70 - 99 mg/dL  GLUCOSE, CAPILLARY     Status: Abnormal   Collection Time    10/26/13  4:19 PM      Result Value Ref Range   Glucose-Capillary 172 (*) 70 - 99 mg/dL  GLUCOSE, CAPILLARY     Status: Abnormal   Collection Time    10/26/13  9:32 PM      Result Value Ref Range   Glucose-Capillary 260 (*) 70 - 99 mg/dL  BASIC METABOLIC PANEL     Status: Abnormal   Collection Time    10/27/13  4:53 AM      Result Value Ref Range   Sodium 128 (*) 137 - 147 mEq/L   Potassium 4.2  3.7 - 5.3 mEq/L   Chloride 88 (*) 96 - 112 mEq/L   CO2 27  19 - 32 mEq/L   Glucose, Bld 163 (*) 70 - 99 mg/dL   BUN 11  6 - 23 mg/dL   Creatinine, Ser 0.73  0.50 - 1.35 mg/dL   Calcium 8.4  8.4 -  10.5 mg/dL   GFR calc non Af Amer >90  >90 mL/min   GFR calc Af Amer >90  >90 mL/min   Comment: (NOTE)     The eGFR has been calculated using the CKD EPI equation.     This calculation has not been validated in all clinical situations.     eGFR's persistently <90 mL/min signify possible Chronic Kidney     Disease.  CBC     Status: Abnormal   Collection Time    10/27/13  4:53 AM      Result Value Ref Range   WBC 16.3 (*) 4.0 - 10.5 K/uL   RBC 3.83 (*) 4.22 - 5.81 MIL/uL   Hemoglobin 10.3 (*) 13.0 - 17.0 g/dL   HCT 32.5 (*) 39.0 - 52.0 %   MCV 84.9  78.0 - 100.0 fL   MCH 26.9  26.0 - 34.0 pg   MCHC 31.7  30.0 - 36.0 g/dL   RDW 20.2 (*) 11.5 - 15.5 %   Platelets 606 (*) 150 - 400 K/uL  GLUCOSE, CAPILLARY     Status: Abnormal   Collection Time    10/27/13  7:38 AM      Result Value Ref Range   Glucose-Capillary 232 (*) 70 - 99 mg/dL   Comment 1 Notify RN    GLUCOSE, CAPILLARY     Status: Abnormal   Collection Time    10/27/13 12:07 PM      Result Value Ref Range    Glucose-Capillary 176 (*) 70 - 99 mg/dL   Comment 1 Notify RN    GLUCOSE, CAPILLARY     Status: Abnormal   Collection Time    10/27/13  4:19 PM      Result Value Ref Range   Glucose-Capillary 246 (*) 70 - 99 mg/dL   Comment 1 Notify RN    URINALYSIS, ROUTINE W REFLEX MICROSCOPIC     Status: Abnormal   Collection Time    10/27/13  5:13 PM      Result Value Ref Range   Color, Urine YELLOW  YELLOW   APPearance CLEAR  CLEAR   Specific Gravity, Urine 1.022  1.005 - 1.030   pH 7.0  5.0 - 8.0   Glucose, UA 250 (*) NEGATIVE mg/dL   Hgb urine dipstick NEGATIVE  NEGATIVE   Bilirubin Urine NEGATIVE  NEGATIVE   Ketones, ur 15 (*) NEGATIVE mg/dL   Protein, ur 30 (*) NEGATIVE mg/dL   Urobilinogen, UA 1.0  0.0 - 1.0 mg/dL   Nitrite NEGATIVE  NEGATIVE   Leukocytes, UA NEGATIVE  NEGATIVE  URINE MICROSCOPIC-ADD ON     Status: Abnormal   Collection Time    10/27/13  5:13 PM      Result Value Ref Range   Squamous Epithelial / LPF FEW (*) RARE   WBC, UA 0-2  <3 WBC/hpf   RBC / HPF 0-2  <3 RBC/hpf   Bacteria, UA FEW (*) RARE  GLUCOSE, CAPILLARY     Status: Abnormal   Collection Time    10/27/13  9:18 PM      Result Value Ref Range   Glucose-Capillary 251 (*) 70 - 99 mg/dL  BASIC METABOLIC PANEL     Status: Abnormal   Collection Time    10/28/13  5:00 AM      Result Value Ref Range   Sodium 129 (*) 137 - 147 mEq/L   Potassium 4.1  3.7 - 5.3 mEq/L   Chloride 88 (*) 96 -  112 mEq/L   CO2 25  19 - 32 mEq/L   Glucose, Bld 200 (*) 70 - 99 mg/dL   BUN 10  6 - 23 mg/dL   Creatinine, Ser 7.35  0.50 - 1.35 mg/dL   Calcium 8.1 (*) 8.4 - 10.5 mg/dL   GFR calc non Af Amer >90  >90 mL/min   GFR calc Af Amer >90  >90 mL/min   Comment: (NOTE)     The eGFR has been calculated using the CKD EPI equation.     This calculation has not been validated in all clinical situations.     eGFR's persistently <90 mL/min signify possible Chronic Kidney     Disease.  CBC WITH DIFFERENTIAL     Status: Abnormal    Collection Time    10/28/13  5:00 AM      Result Value Ref Range   WBC 15.8 (*) 4.0 - 10.5 K/uL   RBC 3.94 (*) 4.22 - 5.81 MIL/uL   Hemoglobin 10.5 (*) 13.0 - 17.0 g/dL   HCT 32.9 (*) 92.4 - 26.8 %   MCV 84.8  78.0 - 100.0 fL   MCH 26.6  26.0 - 34.0 pg   MCHC 31.4  30.0 - 36.0 g/dL   RDW 34.1 (*) 96.2 - 22.9 %   Platelets 609 (*) 150 - 400 K/uL   Neutrophils Relative % 87 (*) 43 - 77 %   Neutro Abs 13.6 (*) 1.7 - 7.7 K/uL   Lymphocytes Relative 6 (*) 12 - 46 %   Lymphs Abs 1.0  0.7 - 4.0 K/uL   Monocytes Relative 7  3 - 12 %   Monocytes Absolute 1.2 (*) 0.1 - 1.0 K/uL   Eosinophils Relative 0  0 - 5 %   Eosinophils Absolute 0.1  0.0 - 0.7 K/uL   Basophils Relative 0  0 - 1 %   Basophils Absolute 0.0  0.0 - 0.1 K/uL  GLUCOSE, CAPILLARY     Status: Abnormal   Collection Time    10/28/13  7:15 AM      Result Value Ref Range   Glucose-Capillary 177 (*) 70 - 99 mg/dL   Comment 1 Notify RN       HEENT: poor oral hygiene, left facial weakness Cardio: RRR and no  murmur Resp: CTA B/L and unlabored GI: BS positive and NT, ND Extremity:  Pulses positive and No Edema Skin:  No satellite lesions,, left butocks with Grade 2 decub, clean with eschar surrounded by granulation tissue Neuro: Alert/Oriented, Abnormal Motor 2-/5 R delt bi tri grip, 2- L HF, RIght side normal strength, Tone:  Increased Reduced LT sensation R foot Flexor tone LUE and Dysarthric Musc/Skel:  Other left AKA, now with occlusive dressing L AKA site Gen NAD Ext R foot warm, intrinsic muscle atrophy,no lesions  Assessment/Plan: 1. Functional deficits secondary to Left AKA /PAD which require 3+ hours per day of interdisciplinary therapy in a comprehensive inpatient rehab setting. Physiatrist is providing close team supervision and 24 hour management of active medical problems listed below. Physiatrist and rehab team continue to assess barriers to discharge/monitor patient progress toward functional and medical  goals.Team conference today please see physician documentation under team conference tab, met with team face-to-face to discuss problems,progress, and goals. Formulized individual treatment plan based on medical history, underlying problem and comorbidities. FIM: FIM - Bathing Bathing Steps Patient Completed: Chest;Left Arm;Abdomen;Right upper leg;Front perineal area;Right lower leg (including foot) Bathing: 3: Mod-Patient completes 5-7 68f 10 parts or  50-74%  FIM - Upper Body Dressing/Undressing Upper body dressing/undressing steps patient completed: Thread/unthread left sleeve of pullover shirt/dress;Put head through opening of pull over shirt/dress;Pull shirt over trunk;Thread/unthread right sleeve of pullover shirt/dresss Upper body dressing/undressing: 5: Supervision: Safety issues/verbal cues FIM - Lower Body Dressing/Undressing Lower body dressing/undressing steps patient completed: Fasten/unfasten right shoe;Don/Doff right shoe Lower body dressing/undressing: 2: Max-Patient completed 25-49% of tasks  FIM - Toileting Toileting steps completed by patient: Performs perineal hygiene Toileting: 1: Two helpers  FIM - Radio producer Devices: Recruitment consultant Transfers: 3-To toilet/BSC: Mod A (lift or lower assist)  FIM - Control and instrumentation engineer Devices: Arm rests Bed/Chair Transfer: 2: Sit > Supine: Max A (lifting assist/Pt. 25-49%);3: Sit > Supine: Mod A (lifting assist/Pt. 50-74%/lift 2 legs)  FIM - Locomotion: Wheelchair Distance: 100 Locomotion: Wheelchair: 0: Activity did not occur FIM - Locomotion: Ambulation Locomotion: Ambulation Assistive Devices: Engineer, agricultural Ambulation/Gait Assistance: 1: +2 Total assist (Mod w/ +2 for chair follow) Locomotion: Ambulation: 0: Activity did not occur  Comprehension Comprehension Mode: Auditory Comprehension: 6-Follows complex conversation/direction: With extra time/assistive  device  Expression Expression Mode: Verbal Expression: 6-Expresses complex ideas: With extra time/assistive device  Social Interaction Social Interaction: 5-Interacts appropriately 90% of the time - Needs monitoring or encouragement for participation or interaction.  Problem Solving Problem Solving: 5-Solves complex 90% of the time/cues < 10% of the time  Memory Memory: 5-Requires cues to use assistive device  Medical Problem List and Plan:  1. Functional deficits secondary to L-AKA due to PAD, some redness along suture line appreciate VVS and Wound care note now on Vanc/Zosyn2. DVT Prophylaxis/Anticoagulation: Pharmaceutical: Lovenox  3. Pain Management: On oxycontin   '40mg'$  BID. Oxycodone 10 mg prn breakthrough pain.  4. Depressed Mood: continue cymbalta.   -still a major issue  5. Neuropsych: This patient is capable of making decisions on his own behalf.  6. HTN: Monitor every 8 hours. Lisinopril resumed today.  7. H/o rectal cancer: Has problems with rectal pain when constipated. D/c senna S, change to Senna 8. Chronic constipation: stools pasty. Suppository every 24 hours if no BM.  9. 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 Continue Lantus 5 units and will titrate to home as indicated 10. Iron deficiency anemia: Loaded with IV iron. Continue po supplement. Check orthostatics, stool for OB, consider transfusion if symptomatic 11. H/o CVA with left spastic hemiparesis: Continue aggrenox.  12. Skin: Fungal infection clearing , Right buttocks improving but Left buttocks is not, because of Left hemiplegia, pt tends to lay on Left side, will Ask nsg to position pt on R side Added diflucan, continue local care per WOC recs. - WOC following, asked to have hydrotherapy at the end of the day so that extra pain medication can be given  -needs to be OOB as much as possible given sacral wounds, All meals in chair   LOS (Days) [place] E 10/28/2013, 10:29 AM

## 2013-10-28 NOTE — Patient Care Conference (Signed)
Inpatient RehabilitationTeam Conference and Plan of Care Update Date: 10/28/2013   Time: 11:20 AM    Patient Name: Donald [name]      Medical Record Number: [id number]  Date of Birth: 19-Nov-1945 Sex: Male         Room/Bed: 4W07C/4W07C-01 Payor Info: Payor: MEDICARE / Plan: MEDICARE PART A AND B / Product Type: *No Product type* /    Admitting Diagnosis: L AKA OLD CVA  Admit Date/Time:  10/13/2013  4:55 PM Admission Comments: No comment available   Primary Diagnosis:  <principal problem not specified> Principal Problem: <principal problem not specified>  Patient Active Problem List   Diagnosis Date Noted  . S/P AKA (above knee amputation) 10/13/2013  . Uncontrolled pain 10/10/2013  . Type II or unspecified type diabetes mellitus with peripheral circulatory disorders, uncontrolled(250.72) 10/10/2013  . Protein-calorie malnutrition, severe 10/09/2013  . Diabetes mellitus with circulatory complication 38/75/6433  . HTN (hypertension) 10/07/2013  . Anemia 10/07/2013  . Ischemia of extremity 10/07/2013  . Ischemic leg 09/05/2013  . Wound discharge 04/09/2013  . Hemiparesis affecting left side as late effect of stroke 01/05/2013  . Abnormality of gait 09/03/2012  . Other acquired deformity of ankle and foot(736.79) 09/03/2012  . Aftercare following surgery of the circulatory system, [place] 05/15/2012  . 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 Peripheral vascular disease, unspecified 03/06/2012  . PVD (peripheral vascular disease) 02/21/2012  . Occlusion and stenosis of carotid artery without mention of cerebral infarction 02/21/2012  . Physical deconditioning 01/31/2012  . Atherosclerotic peripheral vascular disease with rest pain 01/24/2012  . Preoperative evaluation to rule out surgical contraindication 01/24/2012  . Anal carcinoma 10/30/2011  . Anal cancer 02/26/2011  . [place] MALIGNANT NEOPLASM OF LARGE INTESTINE&RECTUM 11/09/2008   . NAUSEA AND VOMITING 11/09/2008  . CONSTIPATION 08/18/2008  . RECTAL BLEEDING 08/18/2008  . ABDOMINAL PAIN -GENERALIZED 08/18/2008  . DM 08/17/2008  . HYPERLIPIDEMIA 08/17/2008  . HEMORRHOIDS 08/17/2008  . CONSTIPATION, CHRONIC 08/17/2008  . CEREBROVASCULAR ACCIDENT, HX OF 08/17/2008    Expected Discharge Date: Expected Discharge Date: 10/27/13  Team Members Present: Physician leading conference: Dr. Alysia Penna Social Worker Present: Ovidio Kin, LCSW Nurse Present: Rayetta Humphrey, RN PT Present: Cameron Sprang, PT OT Present: Clyda Greener, OT SLP Present: Gunnar Fusi, SLP PPS Coordinator present : Ileana Ladd, Lelan Pons, RN, CRRN     Current Status/Progress Goal Weekly Team Focus  Medical   poor tolerance of therapy, wound infection  treat wound infection  post rehab environment   Bowel/Bladder   condom cath on at [facility] [place] in place for bowel  To be continent of B&B  Timed toileting during day   Swallow/Nutrition/ Hydration     wfl        ADL's   Min assist to supervision for UB selfcare, max assist sit to stand for LB selfcare.  Max assist for toilet transfers and toleting.  Decreased standing tolerance to approximately 10 seconds with selfcare tasks.    min assist bathing and dressing and supervision for toileting  selfcare re-training, balance re-training, pain managemen, safety, use of DME, pt/family education, neuro re-education   Mobility   Pt currently min A for bed mobility with use of handrail, max A for squat pivot transfers, max to total A for very short standing.  continues to be severely limited by pain in buttocks, pain in L residual limb, and decreased motivation to participate.   downgraded to max A transfers and S w/c  level  transfers, strnegthening, sitting balance, endurance, pain management.    Communication     wfl        Safety/Cognition/ Behavioral Observations    no unsafe behaviors        Pain   C/o of pain, OxyContin 40mg  po.  sch.  Pain <5  Assess for and treat pain q shift    Skin   Buttocks reddened   No new breakdown  Apply butt cream as orderd      *See Care Plan and progress notes for long and short-term goals.  Barriers to Discharge: multiple medical issues    Possible Resolutions to Barriers:  LTAC eval    Discharge Planning/Teaching Needs:  With the care pt is requiring both feel wife can not provide the care he needs, therefore plan has changed to NHP when medically ready      Team Discussion:  Multiple medical issues-being treated now with IV antibiotics.  Continue to work on his skin issues-hydrotherapy daily. QD therapies due to lack of progress-regressing in his function due to above issues.  Will discuss LTACH with pt and wife.  Revisions to Treatment Plan:  LTACH/NHP.  QD therapies   Continued Need for Acute Rehabilitation Level of Care: The patient requires daily medical management by a physician with specialized training in physical medicine and rehabilitation for the following conditions: Daily direction of a multidisciplinary physical rehabilitation program to ensure safe treatment while eliciting the highest outcome that is of practical value to the patient.: Yes Daily medical management of patient stability for increased activity during participation in an intensive rehabilitation regime.: Yes Daily analysis of laboratory values and/or radiology reports with any subsequent need for medication adjustment of medical intervention for : Other  Elease Hashimoto 10/28/2013, 3:23 PM

## 2013-10-29 ENCOUNTER — Inpatient Hospital Stay (HOSPITAL_COMMUNITY): Payer: Self-pay | Admitting: Rehabilitation

## 2013-10-29 ENCOUNTER — Encounter (HOSPITAL_COMMUNITY): Payer: Medicare Other | Admitting: Certified Registered Nurse Anesthetist

## 2013-10-29 ENCOUNTER — Encounter (HOSPITAL_COMMUNITY)
Admission: RE | Disposition: A | Discharge status: Other Acute Inpatient Hospital | Payer: Self-pay | Source: Intra-hospital | Attending: Physical Medicine & Rehabilitation

## 2013-10-29 ENCOUNTER — Encounter (HOSPITAL_COMMUNITY): Payer: Self-pay | Admitting: Certified Registered Nurse Anesthetist

## 2013-10-29 ENCOUNTER — Encounter (HOSPITAL_COMMUNITY): Payer: Self-pay | Admitting: Occupational Therapy

## 2013-10-29 ENCOUNTER — Ambulatory Visit (HOSPITAL_COMMUNITY): Payer: Self-pay

## 2013-10-29 ENCOUNTER — Encounter (HOSPITAL_COMMUNITY): Payer: Self-pay | Admitting: *Deleted

## 2013-10-29 ENCOUNTER — Inpatient Hospital Stay (HOSPITAL_COMMUNITY): Payer: Medicare Other | Admitting: Certified Registered Nurse Anesthetist

## 2013-10-29 ENCOUNTER — Inpatient Hospital Stay (HOSPITAL_COMMUNITY)
Admission: AD | Admit: 2013-10-29 | Discharge: 2013-11-16 | DRG: 463 | Disposition: A | Discharge status: Hospice | Payer: Medicare Other | Source: Ambulatory Visit | Attending: Vascular Surgery | Admitting: Vascular Surgery

## 2013-10-29 DIAGNOSIS — Z1621 Resistance to vancomycin: Secondary | ICD-10-CM

## 2013-10-29 DIAGNOSIS — M109 Gout, unspecified: Secondary | ICD-10-CM | POA: Diagnosis present

## 2013-10-29 DIAGNOSIS — K219 Gastro-esophageal reflux disease without esophagitis: Secondary | ICD-10-CM | POA: Diagnosis present

## 2013-10-29 DIAGNOSIS — I2584 Coronary atherosclerosis due to calcified coronary lesion: Secondary | ICD-10-CM

## 2013-10-29 DIAGNOSIS — Z89612 Acquired absence of left leg above knee: Secondary | ICD-10-CM

## 2013-10-29 DIAGNOSIS — I2510 Atherosclerotic heart disease of native coronary artery without angina pectoris: Secondary | ICD-10-CM

## 2013-10-29 DIAGNOSIS — I69959 Hemiplegia and hemiparesis following unspecified cerebrovascular disease affecting unspecified side: Secondary | ICD-10-CM

## 2013-10-29 DIAGNOSIS — I4510 Unspecified right bundle-branch block: Secondary | ICD-10-CM | POA: Diagnosis present

## 2013-10-29 DIAGNOSIS — G9341 Metabolic encephalopathy: Secondary | ICD-10-CM | POA: Diagnosis not present

## 2013-10-29 DIAGNOSIS — K5900 Constipation, unspecified: Secondary | ICD-10-CM

## 2013-10-29 DIAGNOSIS — Y835 Amputation of limb(s) as the cause of abnormal reaction of the patient, or of later complication, without mention of misadventure at the time of the procedure: Secondary | ICD-10-CM | POA: Diagnosis present

## 2013-10-29 DIAGNOSIS — M9080 Osteopathy in diseases classified elsewhere, unspecified site: Secondary | ICD-10-CM | POA: Diagnosis present

## 2013-10-29 DIAGNOSIS — A491 Streptococcal infection, unspecified site: Secondary | ICD-10-CM

## 2013-10-29 DIAGNOSIS — A498 Other bacterial infections of unspecified site: Secondary | ICD-10-CM

## 2013-10-29 DIAGNOSIS — C787 Secondary malignant neoplasm of liver and intrahepatic bile duct: Secondary | ICD-10-CM | POA: Diagnosis present

## 2013-10-29 DIAGNOSIS — F3289 Other specified depressive episodes: Secondary | ICD-10-CM | POA: Diagnosis present

## 2013-10-29 DIAGNOSIS — M869 Osteomyelitis, unspecified: Secondary | ICD-10-CM

## 2013-10-29 DIAGNOSIS — L89309 Pressure ulcer of unspecified buttock, unspecified stage: Secondary | ICD-10-CM | POA: Diagnosis present

## 2013-10-29 DIAGNOSIS — I998 Other disorder of circulatory system: Secondary | ICD-10-CM

## 2013-10-29 DIAGNOSIS — I798 Other disorders of arteries, arterioles and capillaries in diseases classified elsewhere: Secondary | ICD-10-CM | POA: Diagnosis present

## 2013-10-29 DIAGNOSIS — F329 Major depressive disorder, single episode, unspecified: Secondary | ICD-10-CM | POA: Diagnosis present

## 2013-10-29 DIAGNOSIS — E43 Unspecified severe protein-calorie malnutrition: Secondary | ICD-10-CM | POA: Diagnosis present

## 2013-10-29 DIAGNOSIS — F32A Depression, unspecified: Secondary | ICD-10-CM

## 2013-10-29 DIAGNOSIS — L8993 Pressure ulcer of unspecified site, stage 3: Secondary | ICD-10-CM | POA: Diagnosis present

## 2013-10-29 DIAGNOSIS — D473 Essential (hemorrhagic) thrombocythemia: Secondary | ICD-10-CM | POA: Diagnosis present

## 2013-10-29 DIAGNOSIS — R0902 Hypoxemia: Secondary | ICD-10-CM | POA: Diagnosis not present

## 2013-10-29 DIAGNOSIS — Z794 Long term (current) use of insulin: Secondary | ICD-10-CM

## 2013-10-29 DIAGNOSIS — Z8673 Personal history of transient ischemic attack (TIA), and cerebral infarction without residual deficits: Secondary | ICD-10-CM

## 2013-10-29 DIAGNOSIS — L89109 Pressure ulcer of unspecified part of back, unspecified stage: Secondary | ICD-10-CM | POA: Diagnosis not present

## 2013-10-29 DIAGNOSIS — Z66 Do not resuscitate: Secondary | ICD-10-CM | POA: Diagnosis not present

## 2013-10-29 DIAGNOSIS — T8740 Infection of amputation stump, unspecified extremity: Principal | ICD-10-CM | POA: Diagnosis present

## 2013-10-29 DIAGNOSIS — R52 Pain, unspecified: Secondary | ICD-10-CM

## 2013-10-29 DIAGNOSIS — G546 Phantom limb syndrome with pain: Secondary | ICD-10-CM

## 2013-10-29 DIAGNOSIS — I480 Paroxysmal atrial fibrillation: Secondary | ICD-10-CM

## 2013-10-29 DIAGNOSIS — I70229 Atherosclerosis of native arteries of extremities with rest pain, unspecified extremity: Secondary | ICD-10-CM

## 2013-10-29 DIAGNOSIS — E1359 Other specified diabetes mellitus with other circulatory complications: Secondary | ICD-10-CM

## 2013-10-29 DIAGNOSIS — E1159 Type 2 diabetes mellitus with other circulatory complications: Secondary | ICD-10-CM | POA: Diagnosis present

## 2013-10-29 DIAGNOSIS — E1169 Type 2 diabetes mellitus with other specified complication: Secondary | ICD-10-CM | POA: Diagnosis present

## 2013-10-29 DIAGNOSIS — R630 Anorexia: Secondary | ICD-10-CM | POA: Diagnosis not present

## 2013-10-29 DIAGNOSIS — Z89619 Acquired absence of unspecified leg above knee: Secondary | ICD-10-CM

## 2013-10-29 DIAGNOSIS — Z87891 Personal history of nicotine dependence: Secondary | ICD-10-CM

## 2013-10-29 DIAGNOSIS — M792 Neuralgia and neuritis, unspecified: Secondary | ICD-10-CM

## 2013-10-29 DIAGNOSIS — T8189XA Other complications of procedures, not elsewhere classified, initial encounter: Secondary | ICD-10-CM

## 2013-10-29 DIAGNOSIS — Z85048 Personal history of other malignant neoplasm of rectum, rectosigmoid junction, and anus: Secondary | ICD-10-CM

## 2013-10-29 DIAGNOSIS — I739 Peripheral vascular disease, unspecified: Secondary | ICD-10-CM

## 2013-10-29 DIAGNOSIS — R110 Nausea: Secondary | ICD-10-CM | POA: Diagnosis not present

## 2013-10-29 DIAGNOSIS — S78119A Complete traumatic amputation at level between unspecified hip and knee, initial encounter: Secondary | ICD-10-CM

## 2013-10-29 DIAGNOSIS — F411 Generalized anxiety disorder: Secondary | ICD-10-CM | POA: Diagnosis present

## 2013-10-29 DIAGNOSIS — Z1639 Resistance to other specified antimicrobial drug: Secondary | ICD-10-CM | POA: Diagnosis present

## 2013-10-29 DIAGNOSIS — I10 Essential (primary) hypertension: Secondary | ICD-10-CM

## 2013-10-29 DIAGNOSIS — T8789 Other complications of amputation stump: Secondary | ICD-10-CM

## 2013-10-29 DIAGNOSIS — L98499 Non-pressure chronic ulcer of skin of other sites with unspecified severity: Secondary | ICD-10-CM

## 2013-10-29 DIAGNOSIS — R1084 Generalized abdominal pain: Secondary | ICD-10-CM

## 2013-10-29 DIAGNOSIS — I4891 Unspecified atrial fibrillation: Secondary | ICD-10-CM | POA: Diagnosis not present

## 2013-10-29 DIAGNOSIS — Z515 Encounter for palliative care: Secondary | ICD-10-CM | POA: Diagnosis not present

## 2013-10-29 DIAGNOSIS — E785 Hyperlipidemia, unspecified: Secondary | ICD-10-CM | POA: Diagnosis present

## 2013-10-29 DIAGNOSIS — B952 Enterococcus as the cause of diseases classified elsewhere: Secondary | ICD-10-CM | POA: Diagnosis present

## 2013-10-29 DIAGNOSIS — T879 Unspecified complications of amputation stump: Secondary | ICD-10-CM

## 2013-10-29 DIAGNOSIS — I69354 Hemiplegia and hemiparesis following cerebral infarction affecting left non-dominant side: Secondary | ICD-10-CM

## 2013-10-29 DIAGNOSIS — C210 Malignant neoplasm of anus, unspecified: Secondary | ICD-10-CM | POA: Diagnosis present

## 2013-10-29 DIAGNOSIS — Z5189 Encounter for other specified aftercare: Secondary | ICD-10-CM | POA: Diagnosis not present

## 2013-10-29 HISTORY — PX: AMPUTATION: SHX166

## 2013-10-29 HISTORY — PX: I&D EXTREMITY: SHX5045

## 2013-10-29 LAB — GLUCOSE, CAPILLARY
GLUCOSE-CAPILLARY: 227 mg/dL — AB (ref 70–99)
Glucose-Capillary: 153 mg/dL — ABNORMAL HIGH (ref 70–99)
Glucose-Capillary: 193 mg/dL — ABNORMAL HIGH (ref 70–99)
Glucose-Capillary: 151 mg/dL — ABNORMAL HIGH (ref 70–99)
Glucose-Capillary: 145 mg/dL — ABNORMAL HIGH (ref 70–99)

## 2013-10-29 LAB — CREATININE, SERUM: Creatinine, Ser: 0.65 mg/dL (ref 0.50–1.35)

## 2013-10-29 LAB — CBC
HEMATOCRIT: 28.9 % — AB (ref 39.0–52.0)
HEMOGLOBIN: 9.2 g/dL — AB (ref 13.0–17.0)
MCH: 26.6 pg (ref 26.0–34.0)
MCHC: 31.8 g/dL (ref 30.0–36.0)
MCV: 83.5 fL (ref 78.0–100.0)
Platelets: 581 10*3/uL — ABNORMAL HIGH (ref 150–400)
RBC: 3.46 MIL/uL — ABNORMAL LOW (ref 4.22–5.81)
RDW: 20.2 % — AB (ref 11.5–15.5)
WBC: 19 10*3/uL — AB (ref 4.0–10.5)

## 2013-10-29 SURGERY — IRRIGATION AND DEBRIDEMENT EXTREMITY
Anesthesia: General | Site: Leg Upper | Laterality: Left

## 2013-10-29 MED ORDER — LABETALOL HCL 5 MG/ML IV SOLN: 10.0000 mg | INTRAVENOUS | Status: DC | PRN

## 2013-10-29 MED ORDER — OXYCODONE HCL 5 MG PO TABS
ORAL_TABLET | ORAL | Status: DC
Start: 2013-10-29 — End: 2013-10-29
  Filled 2013-10-29: qty 3

## 2013-10-29 MED ORDER — MIDAZOLAM HCL 2 MG/2ML IJ SOLN
INTRAMUSCULAR | Status: AC
  Filled 2013-10-29: qty 2

## 2013-10-29 MED ORDER — PROPOFOL 10 MG/ML IV BOLUS
INTRAVENOUS | Status: DC | PRN
  Administered 2013-10-29: 150 mg via INTRAVENOUS
  Administered 2013-10-29: 50 mg via INTRAVENOUS

## 2013-10-29 MED ORDER — GUAIFENESIN-DM 100-10 MG/5ML PO SYRP: 15.0000 mL | ORAL_SOLUTION | ORAL | Status: DC | PRN

## 2013-10-29 MED ORDER — LIDOCAINE HCL (CARDIAC) 20 MG/ML IV SOLN
INTRAVENOUS | Status: AC
  Filled 2013-10-29: qty 5

## 2013-10-29 MED ORDER — POTASSIUM CHLORIDE CRYS ER 20 MEQ PO TBCR: 20.0000 meq | EXTENDED_RELEASE_TABLET | Freq: Every day | ORAL | Status: DC | PRN

## 2013-10-29 MED ORDER — GABAPENTIN 300 MG PO CAPS
300.0000 mg | ORAL_CAPSULE | Freq: Three times a day (TID) | ORAL | Status: DC
  Administered 2013-10-29 – 2013-11-05 (×22): 300 mg via ORAL
  Filled 2013-10-29 (×23): qty 1

## 2013-10-29 MED ORDER — PIPERACILLIN-TAZOBACTAM 3.375 G IVPB
3.3750 g | Freq: Three times a day (TID) | INTRAVENOUS | Status: DC
Start: 2013-10-29 — End: 2013-11-02
  Administered 2013-10-29 – 2013-11-02 (×11): 3.375 g via INTRAVENOUS
  Filled 2013-10-29 (×13): qty 50

## 2013-10-29 MED ORDER — FERROUS FUMARATE 325 (106 FE) MG PO TABS
1.0000 | ORAL_TABLET | Freq: Two times a day (BID) | ORAL | Status: DC
  Administered 2013-10-29 – 2013-11-15 (×35): 106 mg via ORAL
  Filled 2013-10-29 (×37): qty 1

## 2013-10-29 MED ORDER — OMEGA-3-ACID ETHYL ESTERS 1 G PO CAPS
2.0000 g | ORAL_CAPSULE | Freq: Two times a day (BID) | ORAL | Status: DC
Start: 2013-10-30 — End: 2013-11-15
  Administered 2013-10-30 – 2013-11-15 (×29): 2 g via ORAL
  Filled 2013-10-29 (×35): qty 2

## 2013-10-29 MED ORDER — MORPHINE SULFATE 2 MG/ML IJ SOLN
2.0000 mg | INTRAMUSCULAR | Status: DC | PRN
  Administered 2013-10-30 – 2013-10-31 (×3): 2 mg via INTRAVENOUS
  Administered 2013-10-31 (×2): 4 mg via INTRAVENOUS
  Administered 2013-10-31 – 2013-11-01 (×3): 2 mg via INTRAVENOUS
  Administered 2013-11-01: 4 mg via INTRAVENOUS
  Administered 2013-11-01: 2 mg via INTRAVENOUS
  Administered 2013-11-01: 4 mg via INTRAVENOUS
  Administered 2013-11-01 – 2013-11-02 (×3): 2 mg via INTRAVENOUS
  Administered 2013-11-02: 4 mg via INTRAVENOUS
  Administered 2013-11-02 – 2013-11-04 (×8): 2 mg via INTRAVENOUS
  Administered 2013-11-04: 4 mg via INTRAVENOUS
  Administered 2013-11-04 (×2): 2 mg via INTRAVENOUS
  Administered 2013-11-04: 4 mg via INTRAVENOUS
  Administered 2013-11-04 – 2013-11-05 (×4): 2 mg via INTRAVENOUS
  Administered 2013-11-05 (×2): 4 mg via INTRAVENOUS
  Administered 2013-11-05 (×2): 2 mg via INTRAVENOUS
  Administered 2013-11-05: 4 mg via INTRAVENOUS
  Administered 2013-11-05 – 2013-11-06 (×3): 2 mg via INTRAVENOUS
  Administered 2013-11-06: 4 mg via INTRAVENOUS
  Administered 2013-11-07 – 2013-11-10 (×6): 2 mg via INTRAVENOUS
  Filled 2013-10-29 (×4): qty 1
  Filled 2013-10-29: qty 2
  Filled 2013-10-29 (×7): qty 1
  Filled 2013-10-29 (×2): qty 2
  Filled 2013-10-29 (×5): qty 1
  Filled 2013-10-29 (×2): qty 2
  Filled 2013-10-29 (×2): qty 1
  Filled 2013-10-29: qty 2
  Filled 2013-10-29 (×3): qty 1
  Filled 2013-10-29 (×2): qty 2
  Filled 2013-10-29: qty 1
  Filled 2013-10-29: qty 2
  Filled 2013-10-29 (×2): qty 1
  Filled 2013-10-29: qty 2
  Filled 2013-10-29 (×3): qty 1
  Filled 2013-10-29: qty 2
  Filled 2013-10-29 (×3): qty 1
  Filled 2013-10-29: qty 2
  Filled 2013-10-29 (×3): qty 1
  Filled 2013-10-29: qty 2
  Filled 2013-10-29: qty 1
  Filled 2013-10-29: qty 2
  Filled 2013-10-29 (×2): qty 1

## 2013-10-29 MED ORDER — FENTANYL CITRATE 0.05 MG/ML IJ SOLN
25.0000 ug | INTRAMUSCULAR | Status: DC | PRN
  Administered 2013-10-29: 100 ug via INTRAVENOUS
  Administered 2013-10-29: 25 ug via INTRAVENOUS

## 2013-10-29 MED ORDER — MIDAZOLAM HCL 5 MG/5ML IJ SOLN
INTRAMUSCULAR | Status: DC | PRN
  Administered 2013-10-29: 1 mg via INTRAVENOUS

## 2013-10-29 MED ORDER — OXYCODONE-ACETAMINOPHEN 5-325 MG PO TABS
1.0000 | ORAL_TABLET | ORAL | Status: DC | PRN
  Administered 2013-10-29 – 2013-11-04 (×3): 1 via ORAL
  Filled 2013-10-29 (×4): qty 1

## 2013-10-29 MED ORDER — VANCOMYCIN HCL IN DEXTROSE 1-5 GM/200ML-% IV SOLN
1000.0000 mg | Freq: Two times a day (BID) | INTRAVENOUS | Status: DC
  Administered 2013-10-29 – 2013-11-02 (×8): 1000 mg via INTRAVENOUS
  Filled 2013-10-29 (×8): qty 200

## 2013-10-29 MED ORDER — METFORMIN HCL 500 MG PO TABS
500.0000 mg | ORAL_TABLET | Freq: Two times a day (BID) | ORAL | Status: DC
  Administered 2013-10-30 – 2013-11-05 (×13): 500 mg via ORAL
  Filled 2013-10-29 (×15): qty 1

## 2013-10-29 MED ORDER — SENNA 8.6 MG PO TABS
2.0000 | ORAL_TABLET | Freq: Every day | ORAL | Status: DC
  Administered 2013-10-29 (×2): 17.2 mg via ORAL
  Filled 2013-10-29 (×2): qty 2

## 2013-10-29 MED ORDER — LISINOPRIL 2.5 MG PO TABS
2.5000 mg | ORAL_TABLET | Freq: Every day | ORAL | Status: DC
  Administered 2013-10-30 – 2013-11-16 (×18): 2.5 mg via ORAL
  Filled 2013-10-29 (×18): qty 1

## 2013-10-29 MED ORDER — METOPROLOL TARTRATE 1 MG/ML IV SOLN: 2.0000 mg | INTRAVENOUS | Status: DC | PRN

## 2013-10-29 MED ORDER — GLUCERNA SHAKE PO LIQD: 237.0000 mL | Freq: Every day | ORAL | Status: DC

## 2013-10-29 MED ORDER — ONDANSETRON HCL 4 MG/2ML IJ SOLN
INTRAMUSCULAR | Status: DC | PRN
  Administered 2013-10-29: 4 mg via INTRAVENOUS

## 2013-10-29 MED ORDER — SODIUM CHLORIDE 0.9 % IV SOLN
INTRAVENOUS | Status: DC
  Administered 2013-10-29 – 2013-11-09 (×8): via INTRAVENOUS

## 2013-10-29 MED ORDER — SUCCINYLCHOLINE CHLORIDE 20 MG/ML IJ SOLN
INTRAMUSCULAR | Status: DC | PRN
  Administered 2013-10-29: 120 mg via INTRAVENOUS

## 2013-10-29 MED ORDER — ONDANSETRON HCL 4 MG/2ML IJ SOLN
INTRAMUSCULAR | Status: AC
  Filled 2013-10-29: qty 2

## 2013-10-29 MED ORDER — OXYCODONE HCL ER 20 MG PO T12A
40.0000 mg | EXTENDED_RELEASE_TABLET | Freq: Two times a day (BID) | ORAL | Status: DC
  Administered 2013-10-29 – 2013-11-16 (×35): 40 mg via ORAL
  Filled 2013-10-29: qty 1
  Filled 2013-10-29: qty 4
  Filled 2013-10-29 (×2): qty 1
  Filled 2013-10-29: qty 4
  Filled 2013-10-29: qty 1
  Filled 2013-10-29: qty 4
  Filled 2013-10-29 (×7): qty 1
  Filled 2013-10-29: qty 4
  Filled 2013-10-29: qty 1
  Filled 2013-10-29 (×2): qty 4
  Filled 2013-10-29: qty 1
  Filled 2013-10-29: qty 4
  Filled 2013-10-29 (×2): qty 1
  Filled 2013-10-29: qty 4
  Filled 2013-10-29 (×5): qty 1
  Filled 2013-10-29 (×2): qty 4
  Filled 2013-10-29 (×5): qty 1
  Filled 2013-10-29 (×2): qty 4

## 2013-10-29 MED ORDER — DULOXETINE HCL 30 MG PO CPEP
30.0000 mg | ORAL_CAPSULE | Freq: Every day | ORAL | Status: DC
  Administered 2013-10-29 – 2013-11-05 (×8): 30 mg via ORAL
  Filled 2013-10-29 (×8): qty 1

## 2013-10-29 MED ORDER — LACTATED RINGERS IV SOLN
INTRAVENOUS | Status: DC | PRN
  Administered 2013-10-29 (×2): via INTRAVENOUS

## 2013-10-29 MED ORDER — FERROUS SULFATE 325 (65 FE) MG PO TABS
325.0000 mg | ORAL_TABLET | Freq: Two times a day (BID) | ORAL | Status: DC
  Administered 2013-10-29 – 2013-11-15 (×31): 325 mg via ORAL
  Filled 2013-10-29 (×36): qty 1

## 2013-10-29 MED ORDER — FENTANYL CITRATE 0.05 MG/ML IJ SOLN
INTRAMUSCULAR | Status: AC
  Filled 2013-10-29: qty 2

## 2013-10-29 MED ORDER — EPHEDRINE SULFATE 50 MG/ML IJ SOLN
INTRAMUSCULAR | Status: DC | PRN
  Administered 2013-10-29 (×3): 10 mg via INTRAVENOUS

## 2013-10-29 MED ORDER — DOCUSATE SODIUM 100 MG PO CAPS
100.0000 mg | ORAL_CAPSULE | Freq: Two times a day (BID) | ORAL | Status: DC
  Administered 2013-10-29 (×2): 100 mg via ORAL
  Filled 2013-10-29 (×2): qty 1

## 2013-10-29 MED ORDER — PROPOFOL 10 MG/ML IV BOLUS
INTRAVENOUS | Status: AC
  Filled 2013-10-29: qty 20

## 2013-10-29 MED ORDER — FENTANYL CITRATE 0.05 MG/ML IJ SOLN
INTRAMUSCULAR | Status: AC
  Filled 2013-10-29: qty 5

## 2013-10-29 MED ORDER — PHENYLEPHRINE HCL 10 MG/ML IJ SOLN
INTRAMUSCULAR | Status: DC | PRN
  Administered 2013-10-29 (×4): 80 ug via INTRAVENOUS

## 2013-10-29 MED ORDER — ALUM & MAG HYDROXIDE-SIMETH 200-200-20 MG/5ML PO SUSP: 15.0000 mL | ORAL | Status: DC | PRN

## 2013-10-29 MED ORDER — 0.9 % SODIUM CHLORIDE (POUR BTL) OPTIME
TOPICAL | Status: DC | PRN
  Administered 2013-10-29: 1000 mL

## 2013-10-29 MED ORDER — INSULIN DETEMIR 100 UNIT/ML ~~LOC~~ SOLN
10.0000 [IU] | Freq: Every day | SUBCUTANEOUS | Status: DC
  Administered 2013-10-29 – 2013-11-05 (×8): 10 [IU] via SUBCUTANEOUS
  Filled 2013-10-29 (×8): qty 0.1

## 2013-10-29 MED ORDER — ENSURE COMPLETE PO LIQD
237.0000 mL | Freq: Three times a day (TID) | ORAL | Status: DC
  Administered 2013-10-29 – 2013-11-10 (×12): 237 mL via ORAL

## 2013-10-29 MED ORDER — SENNOSIDES-DOCUSATE SODIUM 8.6-50 MG PO TABS
2.0000 | ORAL_TABLET | Freq: Two times a day (BID) | ORAL | Status: DC
  Filled 2013-10-29 (×4): qty 2

## 2013-10-29 MED ORDER — LACTATED RINGERS IV SOLN
INTRAVENOUS | Status: DC
  Administered 2013-10-29: 09:00:00 via INTRAVENOUS

## 2013-10-29 MED ORDER — HYDRALAZINE HCL 20 MG/ML IJ SOLN: 10.0000 mg | INTRAMUSCULAR | Status: DC | PRN

## 2013-10-29 MED ORDER — SACCHAROMYCES BOULARDII 250 MG PO CAPS
250.0000 mg | ORAL_CAPSULE | Freq: Two times a day (BID) | ORAL | Status: DC
  Administered 2013-10-29 – 2013-11-15 (×34): 250 mg via ORAL
  Filled 2013-10-29 (×39): qty 1

## 2013-10-29 MED ORDER — PHENOL 1.4 % MT LIQD: 1.0000 | OROMUCOSAL | Status: DC | PRN

## 2013-10-29 MED ORDER — TRAZODONE 25 MG HALF TABLET
25.0000 mg | ORAL_TABLET | Freq: Every evening | ORAL | Status: DC | PRN
  Administered 2013-11-04: 50 mg via ORAL
  Filled 2013-10-29: qty 2

## 2013-10-29 MED ORDER — ALBUMIN HUMAN 5 % IV SOLN
INTRAVENOUS | Status: DC | PRN
  Administered 2013-10-29: 10:00:00 via INTRAVENOUS

## 2013-10-29 MED ORDER — ASPIRIN-DIPYRIDAMOLE ER 25-200 MG PO CP12
1.0000 | ORAL_CAPSULE | Freq: Two times a day (BID) | ORAL | Status: DC
  Administered 2013-10-30 – 2013-11-16 (×34): 1 via ORAL
  Filled 2013-10-29 (×37): qty 1

## 2013-10-29 MED ORDER — LIDOCAINE HCL (CARDIAC) 20 MG/ML IV SOLN
INTRAVENOUS | Status: DC | PRN
  Administered 2013-10-29: 80 mg via INTRAVENOUS

## 2013-10-29 MED ORDER — ENOXAPARIN SODIUM 40 MG/0.4ML ~~LOC~~ SOLN
40.0000 mg | SUBCUTANEOUS | Status: DC
  Administered 2013-10-30 – 2013-11-15 (×16): 40 mg via SUBCUTANEOUS
  Filled 2013-10-29 (×18): qty 0.4

## 2013-10-29 MED ORDER — PANTOPRAZOLE SODIUM 40 MG PO TBEC
40.0000 mg | DELAYED_RELEASE_TABLET | Freq: Every day | ORAL | Status: DC
  Administered 2013-10-30 – 2013-11-16 (×18): 40 mg via ORAL
  Filled 2013-10-29 (×16): qty 1

## 2013-10-29 MED ORDER — VITAMIN C 500 MG PO TABS
500.0000 mg | ORAL_TABLET | Freq: Every day | ORAL | Status: DC
  Administered 2013-10-31 – 2013-11-15 (×15): 500 mg via ORAL
  Filled 2013-10-29 (×17): qty 1

## 2013-10-29 MED ORDER — ONDANSETRON HCL 4 MG/2ML IJ SOLN
4.0000 mg | Freq: Four times a day (QID) | INTRAMUSCULAR | Status: DC | PRN
  Administered 2013-10-29 – 2013-11-10 (×2): 4 mg via INTRAVENOUS
  Filled 2013-10-29 (×2): qty 2

## 2013-10-29 MED ORDER — MAGNESIUM SULFATE 40 MG/ML IJ SOLN: 2.0000 g | Freq: Every day | INTRAMUSCULAR | Status: DC | PRN

## 2013-10-29 MED ORDER — FENTANYL CITRATE 0.05 MG/ML IJ SOLN
INTRAMUSCULAR | Status: DC | PRN
  Administered 2013-10-29: 25 ug via INTRAVENOUS
  Administered 2013-10-29: 50 ug via INTRAVENOUS
  Administered 2013-10-29: 75 ug via INTRAVENOUS
  Administered 2013-10-29: 25 ug via INTRAVENOUS

## 2013-10-29 MED ORDER — ADULT MULTIVITAMIN W/MINERALS CH
1.0000 | ORAL_TABLET | Freq: Every day | ORAL | Status: DC
  Administered 2013-10-29 – 2013-11-15 (×14): 1 via ORAL
  Filled 2013-10-29 (×18): qty 1

## 2013-10-29 MED ORDER — DOCUSATE SODIUM 100 MG PO CAPS: 200.0000 mg | ORAL_CAPSULE | Freq: Two times a day (BID) | ORAL | Status: DC | PRN

## 2013-10-29 SURGICAL SUPPLY — 44 items
BANDAGE ELASTIC 4 VELCRO ST LF (GAUZE/BANDAGES/DRESSINGS) IMPLANT
BANDAGE ELASTIC 6 VELCRO ST LF (GAUZE/BANDAGES/DRESSINGS) ×3 IMPLANT
BANDAGE GAUZE ELAST BULKY 4 IN (GAUZE/BANDAGES/DRESSINGS) ×3 IMPLANT
BLADE 10 SAFETY STRL DISP (BLADE) ×1 IMPLANT
BLADE SAW RECIP 87.9 MT (BLADE) ×3 IMPLANT
BNDG GAUZE ELAST 4 BULKY (GAUZE/BANDAGES/DRESSINGS) ×12 IMPLANT
BRUSH SCRUB EZ PLAIN DRY (MISCELLANEOUS) ×6 IMPLANT
CANISTER SUCTION 2500CC (MISCELLANEOUS) ×4 IMPLANT
COVER SURGICAL LIGHT HANDLE (MISCELLANEOUS) ×4 IMPLANT
DRAPE INCISE IOBAN 66X45 STRL (DRAPES) IMPLANT
DRAPE ORTHO SPLIT 77X108 STRL (DRAPES) ×8
DRAPE PROXIMA HALF (DRAPES) ×4 IMPLANT
DRAPE SURG ORHT 6 SPLT 77X108 (DRAPES) ×2 IMPLANT
ELECT REM PT RETURN 9FT ADLT (ELECTROSURGICAL) ×4
ELECTRODE REM PT RTRN 9FT ADLT (ELECTROSURGICAL) ×2 IMPLANT
GLOVE BIO SURGEON STRL SZ 6.5 (GLOVE) ×2 IMPLANT
GLOVE BIO SURGEON STRL SZ7.5 (GLOVE) ×10 IMPLANT
GLOVE BIO SURGEONS STRL SZ 6.5 (GLOVE) ×1
GLOVE BIOGEL PI IND STRL 6.5 (GLOVE) ×2 IMPLANT
GLOVE BIOGEL PI IND STRL 7.0 (GLOVE) ×1 IMPLANT
GLOVE BIOGEL PI IND STRL 8 (GLOVE) ×4 IMPLANT
GLOVE BIOGEL PI INDICATOR 6.5 (GLOVE) ×4
GLOVE BIOGEL PI INDICATOR 7.0 (GLOVE) ×2
GLOVE BIOGEL PI INDICATOR 8 (GLOVE) ×6
GOWN STRL REUS W/ TWL LRG LVL3 (GOWN DISPOSABLE) ×6 IMPLANT
GOWN STRL REUS W/ TWL XL LVL3 (GOWN DISPOSABLE) ×1 IMPLANT
GOWN STRL REUS W/TWL LRG LVL3 (GOWN DISPOSABLE) ×12
GOWN STRL REUS W/TWL XL LVL3 (GOWN DISPOSABLE) ×4
KIT BASIN OR (CUSTOM PROCEDURE TRAY) ×4 IMPLANT
KIT ROOM TURNOVER OR (KITS) ×4 IMPLANT
NS IRRIG 1000ML POUR BTL (IV SOLUTION) ×4 IMPLANT
PACK CV ACCESS (CUSTOM PROCEDURE TRAY) IMPLANT
PACK GENERAL/GYN (CUSTOM PROCEDURE TRAY) ×3 IMPLANT
PAD ARMBOARD 7.5X6 YLW CONV (MISCELLANEOUS) ×8 IMPLANT
SPONGE GAUZE 4X4 12PLY (GAUZE/BANDAGES/DRESSINGS) ×4 IMPLANT
SPONGE LAP 18X18 X RAY DECT (DISPOSABLE) ×6 IMPLANT
SUT ETHILON 3 0 PS 1 (SUTURE) IMPLANT
SUT VIC AB 2-0 CTB1 (SUTURE) IMPLANT
SUT VIC AB 3-0 SH 27 (SUTURE)
SUT VIC AB 3-0 SH 27X BRD (SUTURE) IMPLANT
SUT VICRYL 4-0 PS2 18IN ABS (SUTURE) IMPLANT
TOWEL OR 17X24 6PK STRL BLUE (TOWEL DISPOSABLE) ×4 IMPLANT
TOWEL OR 17X26 10 PK STRL BLUE (TOWEL DISPOSABLE) ×4 IMPLANT
WATER STERILE IRR 1000ML POUR (IV SOLUTION) ×4 IMPLANT

## 2013-10-29 NOTE — Progress Notes (Addendum)
[facility]  Subjective  - left leg hurts   Objective 105/65 87 97.9 F (36.6 C) (Oral) 20 97%  Intake/Output Summary (Last 24 hours) at 10/29/13 0753 Last data filed at 10/28/13 1846  Gross per 24 hour  Intake    440 ml  Output   1050 ml  Net   -610 ml   Left AKA no evidence of healing, foul smelling brown maroon drainage  Sacral decubitus stage 2/3 also involving left ischial region  Assessment/Planning: Poorly healing AKA now with evidence of infection.  Poor blood supply to stump most likely will eventually need hip disarticulation but currently with decubitus not really a candidate.  Will have Dr Scot Dock I and D today and possibly place [facility] today to get control of local necrotic tissue.  NPO Consent  Elam Dutch 10/29/2013 7:53 AM --  Laboratory Lab Results:  Recent Labs  10/27/13 0453 10/28/13 0500  WBC 16.3* 15.8*  HGB 10.3* 10.5*  HCT 32.5* 33.4*  PLT 606* 609*   BMET  Recent Labs  10/27/13 0453 10/28/13 0500  NA 128* 129*  K 4.2 4.1  CL 88* 88*  CO2 27 25  GLUCOSE 163* 200*  BUN 11 10  CREATININE 0.73 0.69  CALCIUM 8.4 8.1*    COAG Lab Results  Component Value Date   INR 1.06 10/07/2013   INR 1.14 09/05/2013   INR 1.01 09/04/2013   No results found for this basename: PTT

## 2013-10-29 NOTE — Progress Notes (Signed)
Occupational Therapy Note  Patient Details  Name: Donald [name] MRN: [id number] Date of Birth: 1946-04-14 Today's Date: 10/29/2013  Pt missed 60 mins OT session secondary to being gone for a surgical procedure.     Javion Holmer OTR/L 10/29/2013, 9:36 AM

## 2013-10-29 NOTE — Anesthesia Procedure Notes (Signed)
Procedure Name: LMA Insertion Date/Time: 10/29/2013 9:58 AM Performed by: Ollen Bowl Pre-anesthesia Checklist: Patient identified, Emergency Drugs available, Suction available, Patient being monitored and Timeout performed Patient Re-evaluated:Patient Re-evaluated prior to inductionOxygen Delivery Method: Circle system utilized and Simple face mask Preoxygenation: Pre-oxygenation with 100% oxygen Intubation Type: IV induction Ventilation: Mask ventilation without difficulty LMA: LMA with gastric port inserted LMA Size: 5.0 Number of attempts: 1 Airway Equipment and Method: Patient positioned with wedge pillow Tube secured with: Tape Dental Injury: Teeth and Oropharynx as per pre-operative assessment     Procedure Name: Intubation Date/Time: 10/29/2013 10:08 AM Performed by: Ollen Bowl Pre-anesthesia Checklist: Patient identified, Emergency Drugs available, Suction available, Patient being monitored and Timeout performed Patient Re-evaluated:Patient Re-evaluated prior to inductionOxygen Delivery Method: Circle system utilized and Simple face mask Preoxygenation: Pre-oxygenation with 100% oxygen Intubation Type: IV induction Ventilation: Mask ventilation without difficulty Laryngoscope Size: Mac and 4 Grade View: Grade I Tube type: Oral Tube size: 7.5 mm Number of attempts: 1 Airway Equipment and Method: Patient positioned with wedge pillow and Stylet Placement Confirmation: ETT inserted through vocal cords under direct vision,  positive ETCO2 and breath sounds checked- equal and bilateral Secured at: 23 cm Dental Injury: Teeth and Oropharynx as per pre-operative assessment

## 2013-10-29 NOTE — Progress Notes (Signed)
Appreciate VVS note Pt with multiple medical and post op issues, difficulty tolerating rehab program.  Ask for LTAC eval. Pt will return to rehab post op unless there is a peri op complication

## 2013-10-29 NOTE — Progress Notes (Signed)
ANTIBIOTIC CONSULT NOTE - INITIAL  Pharmacy Consult for Vancomycin, Zosyn Indication: stump infection  Allergies  Allergen Reactions  . Other     Became hypotensive. Pt states allergic to "muscle relaxer" but unsure of name.  . Tape Hives and Itching    medfix non woven tape    Patient Measurements: Height: 5\' 9"  (175.3 cm) Weight: 151 lb 10.8 oz (68.8 kg) IBW/kg (Calculated) : 70.7   Labs:  Recent Labs  10/27/13 0453 10/28/13 0500  WBC 16.3* 15.8*  HGB 10.3* 10.5*  PLT 606* 609*  CREATININE 0.73 0.69     Microbiology: Recent Results (from the past 720 hour(s))  SURGICAL PCR SCREEN     Status: None   Collection Time    10/08/13 12:12 AM      Result Value Ref Range Status   MRSA, PCR NEGATIVE  NEGATIVE Final   Staphylococcus aureus NEGATIVE  NEGATIVE Final   Comment:            The Xpert SA Assay (FDA     approved for NASAL specimens     in patients over 78 years of age),     is one component of     a comprehensive surveillance     program.  Test performance has     been validated by [employer] for patients greater     than or equal to 30 year old.     It is not intended     to diagnose infection nor to     guide or monitor treatment.  URINE CULTURE     Status: None   Collection Time    10/27/13  5:13 PM      Result Value Ref Range Status   Specimen Description URINE, CLEAN CATCH   Final   Special Requests NONE   Final   Culture  Setup Time     Final   Value: 10/27/2013 19:13     Performed at [place]     Final   Value: >=100,000 COLONIES/ML     Performed at [facility]   Culture     Final   Value: Multiple bacterial morphotypes present, none predominant. Suggest appropriate recollection if clinically indicated.     Performed at [facility]   Report Status 10/28/2013 FINAL   Final    Medical History: Past Medical History  Diagnosis Date  . GERD (gastroesophageal reflux disease)   .  Hyperlipidemia   . Hypertension   . Peripheral vascular disease   . Internal and external bleeding hemorrhoids   . History of gout     left great toe  . Depression   . Numbness in right leg     Hx: of diabetes  . Coronary artery disease     RCA occlusion with good collaterals, o/w no obs CAD 12/2011  . Pneumonia ~ 1977  . Type II diabetes mellitus   . History of blood transfusion 12/2011; 12/2012    "before OR; after OR" (01/22/2013)  . Stroke 2008    "left side weak; unable to move left hand still" (01/22/2013)  . Rectal cancer dx'd 08/2007    S/P chemo, radiation, biopsies  . Metastases to the liver dx'd 08/2010    Assessment: 68 year old male with stump wound infection CrCl = 85 ml/min  Goal of Therapy:  Vancomycin trough level 10-15 mcg/ml Appropriate Zosyn dosing  Plan:  1) Vancomycin 1 Gram iv  Q 12 hours 2) Zosyn 3.375 grams iv Q 8 hours - 4 hr infusion 3) Follow up Scr, cultures, progress  Thank you. Anette Guarneri, PharmD [phone]  Tad Moore 10/29/2013,3:09 PM

## 2013-10-29 NOTE — H&P (Signed)
[facility] [place]  Patient name: Donald [name] MRN: [id number] DOB: [birth year] Sex: male  REASON FOR ADMISSION: Nonhealing left above-the-knee amputation.  HPI: Donald [name] is a 68 y.o. male who underwent a left above-the-knee amputation. He was then transferred to rehabilitation. On yesterday it was noted that he had significant breakdown of his above-the-knee amputation. I was asked to debride the wound. Intraoperatively he was found to have significant necrotic muscle tissue in addition to gross purulence. The wound was aggressively debrided and the bone was shortened. The wound was packed. He had been in rehabilitation will have to be readmitted to the hospital for aggressive wound care in hopes of getting the infection controlled enough that he could ultimately have a hip disarticulation on the left.  Past Medical History  Diagnosis Date  . GERD (gastroesophageal reflux disease)   . Hyperlipidemia   . Hypertension   . Peripheral vascular disease   . Internal and external bleeding hemorrhoids   . History of gout     left great toe  . Depression   . Numbness in right leg     Hx: of diabetes  . Coronary artery disease     RCA occlusion with good collaterals, o/w no obs CAD 12/2011  . Pneumonia ~ 1977  . Type II diabetes mellitus   . History of blood transfusion 12/2011; 12/2012    "before OR; after OR" (01/22/2013)  . Stroke 2008    "left side weak; unable to move left hand still" (01/22/2013)  . Rectal cancer dx'd 08/2007    S/P chemo, radiation, biopsies  . Metastases to the liver dx'd 08/2010    Family History  Problem Relation Age of Onset  . Cancer - Other Sister     SOCIAL HISTORY: History  Substance Use Topics  . Smoking status: Former Smoker -- 0.10 packs/day for 57 years    Types: Cigarettes  . Smokeless tobacco: Never Used     Comment: pt states that he has only smoked 1/2 cig since 02/06/2013  . Alcohol Use: No    Allergies   Allergen Reactions  . Other     Became hypotensive. Pt states allergic to "muscle relaxer" but unsure of name.  . Tape Hives and Itching    medfix non woven tape    No current facility-administered medications for this encounter.   REVIEW OF SYSTEMS: Valu.Nieves ] denotes positive finding; [  ] denotes negative finding CARDIOVASCULAR:  [ ]  chest pain   [ ]  chest pressure   [ ]  palpitations   [ ]  orthopnea   [ X] dyspnea on exertion   [ ]  claudication   [ ]  rest pain   [ ]  DVT   [ ]  phlebitis PULMONARY:   [ ]  productive cough   [ ]  asthma   [ ]  wheezing NEUROLOGIC:   [ ]  weakness  [ ]  paresthesias  [ ]  aphasia  [ ]  amaurosis  [ ]  dizziness HEMATOLOGIC:   [ ]  bleeding problems   [ ]  clotting disorders MUSCULOSKELETAL:  [ ]  joint pain   [ ]  joint swelling [ ]  leg swelling GASTROINTESTINAL: [ ]   blood in stool  [ ]   hematemesis GENITOURINARY:  [ ]   dysuria  [ ]   hematuria PSYCHIATRIC:  [ ]  history of major depression INTEGUMENTARY:  [ ]  rashes  [ ]  ulcers CONSTITUTIONAL:  [ ]  fever   [ ]  chills  PHYSICAL EXAM: There were no vitals filed for  this visit. There is no weight on file to calculate BMI. GENERAL: The patient is a well-nourished male, in no acute distress. The vital signs are documented above. CARDIOVASCULAR: There is a regular rate and rhythm.  PULMONARY: There is good air exchange bilaterally without wheezing or rales. ABDOMEN: Soft and non-tender with normal pitched bowel sounds.  MUSCULOSKELETAL: He has a left above-the-knee amputation which is draining purulent drainage. The incision is nonhealing. NEUROLOGIC: No focal weakness or paresthesias are detected. PSYCHIATRIC: The patient has a normal affect.  DATA:  Lab Results  Component Value Date   WBC 15.8* 10/28/2013   HGB 10.5* 10/28/2013   HCT 33.4* 10/28/2013   MCV 84.8 10/28/2013   PLT 609* 10/28/2013   Lab Results  Component Value Date   NA 129* 10/28/2013   K 4.1 10/28/2013   CL 88* 10/28/2013   CO2 25 10/28/2013   Lab Results    Component Value Date   CREATININE 0.69 10/28/2013   Lab Results  Component Value Date   INR 1.06 10/07/2013   INR 1.14 09/05/2013   INR 1.01 09/04/2013   Lab Results  Component Value Date   HGBA1C 7.7* 09/06/2013   CBG (last 3)   Recent Labs  10/29/13 0719 10/29/13 0855 10/29/13 1107  GLUCAP 227* 193* 151*   MEDICAL ISSUES: The patient is now status post debridement of his left above-the-knee amputation. He will be admitted for intravenous antibiotics and aggressive wound care. He will likely ultimately require a hip disarticulation if the infection and wound can be controlled.  [place] [facility] [place] Beeper: [phone]

## 2013-10-29 NOTE — Op Note (Signed)
    NAME: Donald [name]   MRN: [id number] DOB: [birth year]    DATE OF OPERATION: 10/29/2013  PREOP DIAGNOSIS: Nonhealing left above-the-knee amputation  POSTOP DIAGNOSIS: same  PROCEDURE: debridement of nonhealing left above-the-knee amputation  SURGEON: Judeth Cornfield. Scot Dock, MD, FACS  ASSIST: none  ANESTHESIA: Gen.   EBL: minimal  INDICATIONS: Donald [name] is a 68 y.o. male a left above-the-knee amputation which was nonhealing. I was asked to debrid the wound.  FINDINGS: there was significant necrotic tissue and purulence. There was a large amount of muscle which did not appear viable. I shortened a significant amount of the femur and also debrided significant muscle, fascia, and adipose tissue in addition to skin.  TECHNIQUE: The patient was taken to the operating room and received a general anesthetic. The left AKA was prepped and draped in usual sterile fashion. A large fishmouth incision was made encompassing the incision which was nonhealing. This was carried down to the subcutaneous tissue and there was a large amount of necrotic muscle and purulence which was noted. Intraoperative cultures were sent. I sharply debrided a large amount of necrotic nonviable muscle and necrotic soft tissue and fascia. The the femur was dissected free further proximally and divided. I excised approximately an additional 12 cm of femur. It was clearly still muscle which was nonviable and the patient will ultimately likely require a hip disarticulation if this wound can be cleaned up. The wound was irrigated with copious amounts of saline hemostasis obtained. I then packed the wound with moistened 4 x 4's and normal saline. The wound was then wrapped with Kerlix and a four-inch Ace. The patient tolerated the procedure well and was transferred to the recovery room in stable condition. All needle and sponge counts were correct.  Deitra Mayo, MD, FACS [facility]  [facility]  DATE OF DICTATION:   10/29/2013

## 2013-10-29 NOTE — Transfer of Care (Signed)
Immediate Anesthesia Transfer of Care Note  Patient: Donald [name]  Procedure(s) Performed: Procedure(s): IRRIGATION AND DEBRIDEMENT LEFT ABOVE KNEE AMPUTATION (Left) REVISION AMPUTATION ABOVE KNEE (Left)  Patient Location: PACU  Anesthesia Type:General  Level of Consciousness: awake and alert   Airway & Oxygen Therapy: Patient Spontanous Breathing and Patient connected to nasal cannula oxygen  Post-op Assessment: Report given to PACU RN and Post -op Vital signs reviewed and stable  Post vital signs: Reviewed and stable  Complications: No apparent anesthesia complications

## 2013-10-29 NOTE — Progress Notes (Signed)
Occupational Therapy Weekly Progress Note  Patient Details  Name: Donald [name] MRN: [id number] Date of Birth: 05-20-1945  Beginning of progress report period: October 22, 2013 End of progress report period: October 29, 2013  Today's Date: 10/29/2013 Time: 09:48  Pt overall has made little progress toward his LTGs which were set at a min assist to supervision level.  He currently needs total +2 t for all LB bathing and dressing sit to stand as well as min assist for UB selfcare tasks.  He continues to be severely limited by pain in his buttocks secondary to non-healing ulcers which make transitional movements difficult.  In addition, he also has multiple lines including an IV, cathetor, and rectal tube which make participation difficult.  His left stump wound continues with drainage and between that and the rectal tube most sessions have been limited to transfers bed to chair and bed level cleanup.  He has participated some in sit to stand selfcare but currently needs +2 assistance when attempting.  Based on his current medical status feel he will need extensive time for healing as well as continued therapy.  LTACH or SNF are highly recommended as pt's spouse cannot provide the current amount of physical assist needed for him to return home at this time.  Will continue with CIR level therapy QD as pt is not able to tolerate/benefit from BID OT therapy.  Patient continues to demonstrate the following deficits: decreased balance, decreased endurance, increased pain, residual left hemiparesis, non-healing wounds, decreased overall strength, and therefore will continue to benefit from skilled OT intervention to enhance overall performance with BADL.  Patient not progressing toward long term goals.  See goal revision..  Plan of care revisions: Pt to discharge to [facility] vs SNF.  OT Short Term Goals Week 3:   continue to work toward downgraded LTGs  Therapy Documentation Precautions:   Precautions Precautions: Fall Precaution Comments: LT AKA wrap, pt is anxious with mobility, history of Left hemiparesis and increased tone., buttocks wounds  Restrictions Weight Bearing Restrictions: No General: General Amount of Missed OT Time (min): 60 Minutes Vital Signs: Therapy Vitals Temp: 97.6 F (36.4 C) Temp src: Oral Pulse Rate: 89 Resp: 20 BP: 158/86 mmHg Patient Position (if appropriate): Lying Oxygen Therapy SpO2: 97 % O2 Device: Nasal cannula O2 Flow Rate (L/min): 3 L/min Pain: Pain Assessment Pain Score: 10-Worst pain ever Pain Type: Acute pain Pain Location: Leg Pain Orientation: Left Pain Descriptors / Indicators: Aching Pain Intervention(s): Medication (See eMAR) ADL: See FIM for current functional status  Therapy/Group: Individual Therapy  Kadiatou Oplinger OTR/L 10/29/2013, 9:48 AM

## 2013-10-29 NOTE — Progress Notes (Signed)
Rehab admissions - Patient known to me from recent stay on inpatient rehab.  Per rehab social worker, Ovidio Kin, no need for return to inpatient rehab.  Recommend pursuit of LTACH or could consider SNF if LTACH is not an option.  Patient had poor tolerance of our program.  Call me for questions.  #[phone]

## 2013-10-29 NOTE — Care Management Note (Unsigned)
    Page 1 of 2   11/12/2013     4:38:57 PM CARE MANAGEMENT NOTE 11/12/2013  Patient:  Donald, [name]   Account Number:  [id number]  Date Initiated:  10/29/2013  Documentation initiated by:  GRAVES-BIGELOW,BRENDA  Subjective/Objective Assessment:   Pt admitted for Nonhealing left above-the-knee amputation. Underwent I&D. Pt previous was @ CIR.     Action/Plan:   CM will continue to monitor for disposition needs.   Anticipated DC Date:  11/16/2013   Anticipated DC Plan:  SKILLED NURSING FACILITY  In-house referral  Clinical Social Worker      DC Planning Services  CM consult      Choice offered to / List presented to:             Status of service:  In process, will continue to follow Medicare Important Message given?  YES (If response is "NO", the following Medicare IM given date fields will be blank) Date Medicare IM given:  11/02/2013 Medicare IM given by:  GRAVES-BIGELOW,BRENDA Date Additional Medicare IM given:   Additional Medicare IM given by:    Discharge Disposition:    Per UR Regulation:  Reviewed for med. necessity/level of care/duration of stay  If discussed at [place] of Stay Meetings, dates discussed:   11/03/2013  11/05/2013  11/10/2013  11/12/2013    Comments:  11/12/13 Ellan Lambert, RN, BSN [phone] Pt back to OR tomorrow for further debridement of wound. CSW cont to follow for SNF at dc.  Will follow progress.  11/09/13- 1130- Marvetta Gibbons RN, BSN [phone] Pt continues to have bedside debridement with low tolerance- may need to return to OR for further OR debridement pending progress-  MD has discussed with him the possibility of requiring a hip disarticulation currently he is not in favor of considering this. CSW following for SNF placement when medically stable- continues on IV abx   11-02-13 Jacqlyn Krauss, RN,BSN [phone] CSW following pt for disposition needs fro SNF. CM will continue ot monitor.   Pt previoulsy active with [facility]  services with [facility] also.

## 2013-10-29 NOTE — Progress Notes (Signed)
UR Completed Yoni Lobos Graves-Bigelow, RN,BSN [phone]  

## 2013-10-29 NOTE — Progress Notes (Signed)
  [facility] Progress Note  10/29/2013 7:52 AM POD 22  Subjective:  sore  Tm 99.3 now afebrile   Filed Vitals:   10/29/13 0555  BP: 105/65  Pulse: 87  Temp: 97.9 F (36.6 C)  Resp: 20    Physical Exam: Incisions:  Staples removed from mid section and there is copious amounts of malodorous brown drainage from wound.  Discoloration of posterior flap has improved from 2 days ago.  CBC    Component Value Date/Time   WBC 15.8* 10/28/2013 0500   WBC 7.1 06/05/2013 1010   RBC 3.94* 10/28/2013 0500   RBC 4.23 06/05/2013 1010   RBC 3.54* 01/26/2012 0455   HGB 10.5* 10/28/2013 0500   HGB 9.3* 06/05/2013 1010   HCT 33.4* 10/28/2013 0500   HCT 30.8* 06/05/2013 1010   PLT 609* 10/28/2013 0500   PLT 324 06/05/2013 1010   MCV 84.8 10/28/2013 0500   MCV 72.9* 06/05/2013 1010   MCH 26.6 10/28/2013 0500   MCH 22.0* 06/05/2013 1010   MCHC 31.4 10/28/2013 0500   MCHC 30.2* 06/05/2013 1010   RDW 20.1* 10/28/2013 0500   RDW 16.9* 06/05/2013 1010   LYMPHSABS 1.0 10/28/2013 0500   LYMPHSABS 1.6 06/05/2013 1010   MONOABS 1.2* 10/28/2013 0500   MONOABS 0.5 06/05/2013 1010   EOSABS 0.1 10/28/2013 0500   EOSABS 0.1 06/05/2013 1010   BASOSABS 0.0 10/28/2013 0500   BASOSABS 0.1 06/05/2013 1010    BMET    Component Value Date/Time   NA 129* 10/28/2013 0500   NA 137 06/05/2013 1010   NA 147* 10/12/2011 0959   K 4.1 10/28/2013 0500   K 5.4* 06/05/2013 1010   K 5.2* 10/12/2011 0959   CL 88* 10/28/2013 0500   CL 102 05/19/2012 1427   CL 102 10/12/2011 0959   CO2 25 10/28/2013 0500   CO2 23 06/05/2013 1010   CO2 29 10/12/2011 0959   GLUCOSE 200* 10/28/2013 0500   GLUCOSE 239* 06/05/2013 1010   GLUCOSE 205* 05/19/2012 1427   GLUCOSE 244* 10/12/2011 0959   BUN 10 10/28/2013 0500   BUN 19.5 06/05/2013 1010   BUN 13 10/12/2011 0959   CREATININE 0.69 10/28/2013 0500   CREATININE 1.4* 06/05/2013 1010   CREATININE 1.0 10/12/2011 0959   CALCIUM 8.1* 10/28/2013 0500   CALCIUM 10.1 06/05/2013 1010   CALCIUM 9.0 10/12/2011 0959   GFRNONAA >90 10/28/2013  0500   GFRAA >90 10/28/2013 0500    INR    Component Value Date/Time   INR 1.06 10/07/2013 1748     Intake/Output Summary (Last 24 hours) at 10/29/13 0752 Last data filed at 10/28/13 1846  Gross per 24 hour  Intake    440 ml  Output   1050 ml  Net   -610 ml     Assessment/Plan:  68 y.o. male is s/p left above knee amputation  POD 22  -pt's wound is non healing with malodorous drainage -pt has not had anything to eat this morning -will take to the operating room for wash out of his stump. -still with increased WBC yesterday, but was slightly improved -continue vanc/zosyn   Leontine Locket, PA-C [facility] [phone] 10/29/2013 7:52 AM

## 2013-10-29 NOTE — Progress Notes (Addendum)
INITIAL NUTRITION ASSESSMENT  DOCUMENTATION CODES Per approved criteria  -Severe malnutrition in the context of chronic illness    Pt meets criteria for severe MALNUTRITION in the context of chronic illness as evidenced by 8.5% weight loss in the past month, intake </= 75% of estimated nutrition needs for >/= 1 month and severe depletion of muscle mass.  INTERVENTION:  Ensure Complete PO TID (mixed with ice cream to make a milkshake), each supplement provides 350 kcal and 13 grams of protein  Magic Cup once daily with lunch  MVI daily  Consider appetite stimulant  NUTRITION DIAGNOSIS: Increased nutrient needs related to wounds as evidenced by estimated calorie and protein needs.   Goal: Intake to meet >90% of estimated nutrition needs.  Monitor:  PO intake, labs, weight trend.  Reason for Assessment: MST  68 y.o. male  Admitting Dx: Nonhealing left above-the-knee amputation  ASSESSMENT: 68 y.o. male who underwent a left above-the-knee amputation. He was then transferred to rehabilitation. On 7/1 it was noted that he had significant breakdown of his above-the-knee amputation. Transferred to hospital (acute side) on 7/2 for wound debridement. Intraoperatively he was found to have significant necrotic muscle tissue in addition to gross purulence. The wound was aggressively debrided and the bone was shortened. The wound was packed  Admitted to the hospital for aggressive wound care in hopes of getting the infection controlled enough that he could ultimately have a hip disarticulation on the left.  During rehab admission, patient was eating very poorly due to depression, consuming </= 25% of meals. Was also receiving milkshakes made with Ensure Complete daily as well as Prostat TID, Magic Cups TID, and snacks between meals. He likes the milkshakes, but none of the other supplements.  Weight down 14 lbs (8.5 %) over the past 2 weeks.  Nutrition Focused Physical  Exam:  Subcutaneous Fat:  Orbital Region: WNL Upper Arm Region: mild depletion Thoracic and Lumbar Region: NA  Muscle:  Temple Region: WNL Clavicle Bone Region: WNL Clavicle and Acromion Bone Region: WNL Scapular Bone Region: NA Dorsal Hand: WNL Patellar Region: moderate depletion Anterior Thigh Region: moderate depletion Posterior Calf Region: severe depletion  Edema: present at amputation site  Height: Ht Readings from Last 1 Encounters:  10/29/13 5\' 9"  (1.753 m)    Weight:  Wt Readings from Last 1 Encounters:  10/29/13 151 lb 10.8 oz (68.8 kg)  10/13/13  165 lb 10.4 oz (75.137 kg)  Ideal Body Weight: 72.7 kg  % Ideal Body Weight: 95%  Wt Readings from Last 10 Encounters:  10/29/13 151 lb 10.8 oz (68.8 kg)  10/13/13 165 lb 10.4 oz (75.137 kg)  10/13/13 165 lb 10.4 oz (75.137 kg)  10/07/13 166 lb (75.297 kg)  10/07/13 166 lb (75.297 kg)  10/01/13 173 lb (78.472 kg)  09/23/13 174 lb 1.6 oz (78.971 kg)  09/11/13 175 lb (79.379 kg)  09/11/13 175 lb (79.379 kg)  09/11/13 175 lb (79.379 kg)    Usual Body Weight: 175 lb  % Usual Body Weight: 86% (some weight loss related to amputation)  BMI:  Body mass index is 22.39 kg/(m^2).  Estimated Nutritional Needs: Kcal: 2000-2200 Protein: 100-120 gm Fluid: 2-2.2 L  Skin: left thigh, amputation site wound  Diet Order: General  EDUCATION NEEDS: -Education not appropriate at this time  No intake or output data in the 24 hours ending 10/29/13 1523  Last BM: 7/1   Labs:   Recent Labs Lab 10/27/13 0453 10/28/13 0500  NA 128* 129*  K  4.2 4.1  CL 88* 88*  CO2 27 25  BUN 11 10  CREATININE 0.73 0.69  CALCIUM 8.4 8.1*  GLUCOSE 163* 200*    CBG (last 3)   Recent Labs  10/29/13 0719 10/29/13 0855 10/29/13 1107  GLUCAP 227* 193* 151*    Scheduled Meds: . [START ON 10/30/2013] dipyridamole-aspirin  1 capsule Oral BID  . docusate sodium  100 mg Oral BID  . DULoxetine  30 mg Oral Daily  . [START ON  10/30/2013] enoxaparin (LOVENOX) injection  40 mg Subcutaneous Q24H  . feeding supplement (GLUCERNA SHAKE)  237 mL Oral Daily  . ferrous fumarate  1 tablet Oral BID  . ferrous sulfate  325 mg Oral BID WC  . gabapentin  300 mg Oral TID  . insulin detemir  10 Units Subcutaneous Daily  . [START ON 10/30/2013] lisinopril  2.5 mg Oral Daily  . [START ON 10/30/2013] metFORMIN  500 mg Oral BID WC  . [START ON 10/30/2013] omega-3 acid ethyl esters  2 g Oral BID  . OxyCODONE  40 mg Oral Q12H  . [START ON 10/30/2013] pantoprazole  40 mg Oral Daily  . piperacillin-tazobactam (ZOSYN)  IV  3.375 g Intravenous Q8H  . saccharomyces boulardii  250 mg Oral BID  . senna  2 tablet Oral QHS  . senna-docusate  2 tablet Oral BID  . vancomycin  1,000 mg Intravenous Q12H  . [START ON 10/30/2013] vitamin C  500 mg Oral Daily    Continuous Infusions: . sodium chloride      Past Medical History  Diagnosis Date  . GERD (gastroesophageal reflux disease)   . Hyperlipidemia   . Hypertension   . Peripheral vascular disease   . Internal and external bleeding hemorrhoids   . History of gout     left great toe  . Depression   . Numbness in right leg     Hx: of diabetes  . Coronary artery disease     RCA occlusion with good collaterals, o/w no obs CAD 12/2011  . Pneumonia ~ 1977  . Type II diabetes mellitus   . History of blood transfusion 12/2011; 12/2012    "before OR; after OR" (01/22/2013)  . Stroke 2008    "left side weak; unable to move left hand still" (01/22/2013)  . Rectal cancer dx'd 08/2007    S/P chemo, radiation, biopsies  . Metastases to the liver dx'd 08/2010    Past Surgical History  Procedure Laterality Date  . Carotid endarterectomy Right 2008  . Liver cryoablation  08/2010    "chemo shrunk the cancer 50% then they went in and burned the rest of it"  . Axillary-femoral bypass graft  01/29/2012    Procedure: BYPASS GRAFT AXILLA-BIFEMORAL;  Surgeon: Elam Dutch, MD;  [facility] OR;  Service:  Vascular;  Laterality: N/A;  Left axilla-bifemoral bypass using gore-tex graft.   . Colonoscopy w/ biopsies and polypectomy      Hx: of  . Femoral-popliteal bypass graft Left 01/06/2013    Procedure: Femoral-Popliteal Artery Bypass;  Surgeon: Elam Dutch, MD;  Location: [place];  Service: Vascular;  Laterality: Left;  . Intraoperative arteriogram Left 01/06/2013    Procedure: INTRA OPERATIVE ARTERIOGRAM;  Surgeon: Elam Dutch, MD;  Location: [place];  Service: Vascular;  Laterality: Left;  . Femoral-popliteal bypass graft Left 01/10/2013    Procedure: THROMBECTOMY POSSIBLE BYPASS GRAFT FEMORAL-POPLITEAL ARTERY;  Surgeon: Mal Misty, MD;  Location: [place];  Service: Vascular;  Laterality: Left;  Attempted Thrombectomy of left Femoral/Popliteal graft - Unsuccessful.  Insertion of Left femoral to below the knee popliteal propaten graft.  . Mass biopsy  2009    "found mass that was cancer; later had chemo and radiation; never cut mass out" (01/22/2013)  . Femoral-popliteal bypass graft Left 09/05/2013    Procedure: Thrombectomy of Left Femoral-Below Knee Popliteal Bypass Graft; Patch Angioplasty of Distal Portion of Bypass Graft;  Surgeon: Angelia Mould, MD;  Location: [place];  Service: Vascular;  Laterality: Left;  . Patch angioplasty Left 09/05/2013    Procedure: Patch Angioplasty Distal Portion of Left Femoral-Below Knee Popliteal Bypass Graft;  Surgeon: Angelia Mould, MD;  Location: [place];  Service: Vascular;  Laterality: Left;  . Intraoperative arteriogram Left 09/05/2013    Procedure: INTRA OPERATIVE ARTERIOGRAM;  Surgeon: Angelia Mould, MD;  Location: [place];  Service: Vascular;  Laterality: Left;  . Fasciotomy Left 09/05/2013    Procedure: FASCIOTOMY;  Surgeon: Angelia Mould, MD;  Location: [place];  Service: Vascular;  Laterality: Left;  Marland Kitchen Vascular surgery    . Fasciotomy closure Left 09/11/2013    Procedure: CLOSURE OF FASCIOTOMY SITES LEFT LOWER EXTREMITY AND WOUND  VAC APPLICATION;  Surgeon: Angelia Mould, MD;  Location: [place];  Service: Vascular;  Laterality: Left;  . Amputation Left 10/08/2013    Procedure: LEFT ABOVE KNEE AMPUTATION;  Surgeon: Serafina Mitchell, MD;  Location: [place];  Service: Vascular;  Laterality: Left;    Molli Barrows, RD, LDN, [place] Pager [phone] After Hours Pager [phone]

## 2013-10-29 NOTE — Progress Notes (Signed)
Hydrotherapy Cancellation Note  Patient Details Name: Donald [name] MRN: [id number] DOB: 09/25/1945   Cancelled Treatment:    Reason Eval/Treat Not Completed: Patient not medically ready. Pt was in OR earlier today when Hydrotherapy was scheduled (on Rehab). Pt now has moved to acute care. Spoke with RN on stepdown unit and agreed hydrotherapy to Lt buttock may start tomorrow 10/30/13. Discussed what current dressing change to this area has been while on Rehab (Santyl/collagenase, NS 4x4s, dry 4x4s, light tape, briefs vs ABD with tape), in case of soiling by BM and need to be changed.    Kymberli Wiegand 10/29/2013, 3:32 PM Pager [phone]

## 2013-10-29 NOTE — Progress Notes (Signed)
Pt ate one magic cup, ensure, and regular chocolate ice cream

## 2013-10-29 NOTE — Anesthesia Preprocedure Evaluation (Addendum)
Anesthesia Evaluation  Patient identified by MRN, date of birth, ID band Patient awake    Reviewed: Allergy & Precautions, H&P , NPO status , Patient's Chart, lab work & pertinent test results  Airway Mallampati: II      Dental  (+) Edentulous Upper, Poor Dentition, Loose, Dental Advisory Given,    Pulmonary pneumonia -, former smoker,          Cardiovascular hypertension, Pt. on medications and Pt. on home beta blockers + CAD     Neuro/Psych Anxiety Depression CVA    GI/Hepatic Neg liver ROS, GERD-  ,  Endo/Other  diabetes, Type 2, Insulin Dependent  Renal/GU negative Renal ROS     Musculoskeletal   Abdominal   Peds  Hematology  (+) anemia ,   Anesthesia Other Findings   Reproductive/Obstetrics                          Anesthesia Physical Anesthesia Plan  ASA: III  Anesthesia Plan: General   Post-op Pain Management:    Induction: Intravenous  Airway Management Planned: Oral ETT  Additional Equipment:   Intra-op Plan:   Post-operative Plan: Extubation in OR  Informed Consent: I have reviewed the patients History and Physical, chart, labs and discussed the procedure including the risks, benefits and alternatives for the proposed anesthesia with the patient or authorized representative who has indicated his/her understanding and acceptance.   Dental advisory given  Plan Discussed with: CRNA and Anesthesiologist  Anesthesia Plan Comments:         Anesthesia Quick Evaluation

## 2013-10-29 NOTE — Consult Note (Signed)
[place] nurse contacted by PT regarding continuation of wound care orders and hydrotherapy orders.  Pt just seen with PT this week and therefore I have updated the orders based on our assessment this week and my case conference with the PT today.  Will follow up again with PT tom and see patient in am during hydrotherapy to determine any updates to his POC if necessary.  Beatriz Settles [place] RN,CWOCN [phone]

## 2013-10-29 NOTE — Anesthesia Postprocedure Evaluation (Signed)
  Anesthesia Post-op Note  Patient: Donald [name]  Procedure(s) Performed: Procedure(s): IRRIGATION AND DEBRIDEMENT LEFT ABOVE KNEE AMPUTATION (Left) REVISION AMPUTATION ABOVE KNEE (Left)  Patient Location: PACU  Anesthesia Type:General  Level of Consciousness: awake  Airway and Oxygen Therapy: Patient Spontanous Breathing  Post-op Pain: mild  Post-op Assessment: Post-op Vital signs reviewed  Post-op Vital Signs: Reviewed  Last Vitals:  Filed Vitals:   10/29/13 1105  BP:   Pulse:   Temp: 36.1 C  Resp: 14    Complications: No apparent anesthesia complications

## 2013-10-30 ENCOUNTER — Ambulatory Visit (HOSPITAL_COMMUNITY): Payer: Self-pay

## 2013-10-30 LAB — GLUCOSE, CAPILLARY
GLUCOSE-CAPILLARY: 259 mg/dL — AB (ref 70–99)
GLUCOSE-CAPILLARY: 161 mg/dL — AB (ref 70–99)
Glucose-Capillary: 158 mg/dL — ABNORMAL HIGH (ref 70–99)
Glucose-Capillary: 172 mg/dL — ABNORMAL HIGH (ref 70–99)

## 2013-10-30 LAB — BASIC METABOLIC PANEL
ANION GAP: 13 (ref 5–15)
BUN: 9 mg/dL (ref 6–23)
CALCIUM: 8.3 mg/dL — AB (ref 8.4–10.5)
CO2: 26 meq/L (ref 19–32)
Chloride: 91 mEq/L — ABNORMAL LOW (ref 96–112)
Creatinine, Ser: 0.86 mg/dL (ref 0.50–1.35)
GFR calc Af Amer: >90 mL/min (ref >90)
GFR calc non Af Amer: 86 mL/min — ABNORMAL LOW (ref >90)
Glucose, Bld: 126 mg/dL — ABNORMAL HIGH (ref 70–99)
Potassium: 4.1 mEq/L (ref 3.7–5.3)
SODIUM: 130 meq/L — AB (ref 137–147)

## 2013-10-30 LAB — VANCOMYCIN, TROUGH: Vancomycin Tr: 19.4 ug/mL (ref 10.0–20.0)

## 2013-10-30 LAB — CBC
HCT: 30.7 % — ABNORMAL LOW (ref 39.0–52.0)
Hemoglobin: 9.5 g/dL — ABNORMAL LOW (ref 13.0–17.0)
MCH: 26.2 pg (ref 26.0–34.0)
MCHC: 30.9 g/dL (ref 30.0–36.0)
MCV: 84.8 fL (ref 78.0–100.0)
PLATELETS: 589 10*3/uL — AB (ref 150–400)
RBC: 3.62 MIL/uL — ABNORMAL LOW (ref 4.22–5.81)
RDW: 20.3 % — AB (ref 11.5–15.5)
WBC: 18.3 10*3/uL — ABNORMAL HIGH (ref 4.0–10.5)

## 2013-10-30 MED ORDER — INSULIN ASPART 100 UNIT/ML ~~LOC~~ SOLN
0.0000 [IU] | Freq: Three times a day (TID) | SUBCUTANEOUS | Status: DC
  Administered 2013-10-30 (×2): 2 [IU] via SUBCUTANEOUS
  Administered 2013-10-31 (×2): 1 [IU] via SUBCUTANEOUS
  Administered 2013-11-01: 2 [IU] via SUBCUTANEOUS
  Administered 2013-11-02 – 2013-11-06 (×5): 1 [IU] via SUBCUTANEOUS
  Administered 2013-11-06 – 2013-11-07 (×3): 2 [IU] via SUBCUTANEOUS
  Administered 2013-11-07: 1 [IU] via SUBCUTANEOUS
  Administered 2013-11-08 (×2): 2 [IU] via SUBCUTANEOUS
  Administered 2013-11-08: 1 [IU] via SUBCUTANEOUS
  Administered 2013-11-09: 2 [IU] via SUBCUTANEOUS
  Administered 2013-11-09 – 2013-11-10 (×2): 1 [IU] via SUBCUTANEOUS
  Administered 2013-11-10: 3 [IU] via SUBCUTANEOUS
  Administered 2013-11-10: 1 [IU] via SUBCUTANEOUS
  Administered 2013-11-11: 2 [IU] via SUBCUTANEOUS
  Administered 2013-11-11: 3 [IU] via SUBCUTANEOUS
  Administered 2013-11-11 – 2013-11-12 (×2): 2 [IU] via SUBCUTANEOUS
  Administered 2013-11-12: 1 [IU] via SUBCUTANEOUS
  Administered 2013-11-13 – 2013-11-14 (×4): 2 [IU] via SUBCUTANEOUS
  Administered 2013-11-15: 1 [IU] via SUBCUTANEOUS

## 2013-10-30 MED ORDER — INSULIN ASPART 100 UNIT/ML ~~LOC~~ SOLN: 0.0000 [IU] | Freq: Every day | SUBCUTANEOUS | Status: DC

## 2013-10-30 MED ORDER — INSULIN ASPART 100 UNIT/ML ~~LOC~~ SOLN: 0.0000 [IU] | Freq: Three times a day (TID) | SUBCUTANEOUS | Status: DC

## 2013-10-30 MED ORDER — COLLAGENASE 250 UNIT/GM EX OINT
TOPICAL_OINTMENT | Freq: Every day | CUTANEOUS | Status: DC
  Administered 2013-10-31: 10:00:00 via TOPICAL
  Administered 2013-11-01: 1 via TOPICAL
  Administered 2013-11-02 – 2013-11-15 (×13): via TOPICAL
  Filled 2013-10-30 (×3): qty 30

## 2013-10-30 NOTE — Progress Notes (Signed)
Physical Therapy Wound Treatment Patient Details  Name: Donald [name] MRN: [id number] Date of Birth: 10-Jul-1945  Today's Date: 10/30/2013 Time: 8127-5170 Time Calculation (min): 33 min  Subjective  Subjective: Reports RN will put in rectal tube after hydrotherapy Patient and Family Stated Goals: decr pain and heal wound Prior Treatments: "every kind of cream you can imagine"  Pain Score:   8/10 during treatment  Wound Assessment  Clinical Statement: Slight incr in red granulation buds showing through the adherent yellow slough.   Wound / Incision (Open or Dehisced) 10/13/13 Other (Comment) Buttocks Left;Distal MSDA (Active)  Dressing Type Barrier Film (skin prep) 10/29/2013  7:35 PM  Dressing Changed Changed 10/28/2013  4:00 PM  Dressing Status Other (Comment) 10/30/2013  9:11 AM  Dressing Change Frequency Daily 10/30/2013  9:11 AM  Site / Wound Assessment Painful;Brown;Granulation tissue;Pink;Yellow 10/30/2013  9:11 AM  % Wound base Red or Granulating 5% 10/30/2013  9:11 AM  % Wound base Yellow 80% 10/30/2013  9:11 AM  % Wound base Black 0% 10/30/2013  9:11 AM  % Wound base Other (Comment) 15% 10/30/2013  9:11 AM  Peri-wound Assessment Erythema (non-blanchable);Pink;Excoriated 10/30/2013  9:11 AM  Wound Length (cm) 7.8 cm 10/22/2013  4:01 PM  Wound Width (cm) 5.7 cm 10/22/2013  4:01 PM  Wound Depth (cm) 0.2 cm 10/13/2013  5:12 PM  Margins Unattached edges (unapproximated) 10/30/2013  9:11 AM  Closure None 10/30/2013  9:11 AM  Drainage Amount Minimal 10/30/2013  9:11 AM  Drainage Description Serous 10/30/2013  9:11 AM  Non-staged Wound Description Partial thickness 10/30/2013  9:11 AM  Treatment Debridement (Selective);Hydrotherapy (Ultrasonic mist) 10/30/2013  9:11 AM     Wound / Incision (Open or Dehisced) 10/13/13 Other (Comment) Buttocks Left;Proximal MSDA; includes 9 open areas proximal to large buttock wound (Active)  Dressing Type Other (Comment) 10/29/2013  7:35 PM  Dressing Changed Changed 10/28/2013   4:00 PM  Dressing Status Clean;Dry 10/29/2013  7:35 PM  Dressing Change Frequency Daily 10/30/2013  9:11 AM  Site / Wound Assessment Painful;Pink;Yellow 10/30/2013  9:11 AM  % Wound base Red or Granulating 5% 10/30/2013  9:11 AM  % Wound base Yellow 95% 10/30/2013  9:11 AM  % Wound base Black 0% 10/30/2013  9:11 AM  % Wound base Other (Comment) 0% 10/30/2013  9:11 AM  Peri-wound Assessment Erythema (non-blanchable);Excoriated;Pink 10/30/2013  9:11 AM  Wound Length (cm) 6.5 cm 10/22/2013  4:01 PM  Wound Width (cm) 5.8 cm 10/22/2013  4:01 PM  Wound Depth (cm) 1 cm 10/13/2013  5:12 PM  Tunneling (cm) 0.2 10/13/2013  5:12 PM  Margins Unattached edges (unapproximated) 10/30/2013  9:11 AM  Closure None 10/30/2013  9:11 AM  Drainage Amount Minimal 10/30/2013  9:11 AM  Drainage Description Serous 10/30/2013  9:11 AM  Non-staged Wound Description Partial thickness 10/30/2013  9:11 AM  Treatment Debridement (Selective);Hydrotherapy (Ultrasonic mist) 10/30/2013  9:11 AM   Hydrotherapy Ultrasonic mist  - wound location: Lt buttock  Ultrasonic mist at 35KHz (+/-3KHz) at ___ percent: 100 % (had to decrease to 50% for contact) Ultrasonic mist therapy minutes: 8 min (4 min non-contact; 4 minutes contact) Selective Debridement Selective Debridement - Location: Lt buttock wounds Selective Debridement - Tools Used: Other (comment) (tip of ultrasound wand) Selective Debridement - Tissue Removed: yellow, brown slough (and scored to increase surface area)   Wound Assessment and Plan  Wound Therapy - Assess/Plan/Recommendations: Note: Pt readmitted to acute hospital from our rehab hospital after surgery to  Lt AKA. Goals continue  to be appropriate. Wound Therapy - Clinical Statement: Slight incr in red granulation buds showing through the adherent yellow slough.  Wound Therapy - Functional Problem List: limiting all mobility per PT & OT due to pain Factors Delaying/Impairing Wound Healing: Diabetes Mellitus;Immobility;Multiple medical  problems Hydrotherapy Plan: Debridement;Dressing change;Patient/family education;Ultrasonic wound therapy _0  KHz (+/- 3 KHz) Wound Therapy - Frequency: 6X / week Wound Therapy - Current Recommendations: WOC nurse Wound Therapy - Follow Up Recommendations: Skilled nursing facility Wound Plan: see above  Wound Therapy Goals- Improve the function of patient's integumentary system by progressing the wound(s) through the phases of wound healing (inflammation - proliferation - remodeling) by: Decrease Necrotic Tissue to: 70 Decrease Necrotic Tissue - Progress: Progressing toward goal Increase Granulation Tissue to: 30 Increase Granulation Tissue - Progress: Progressing toward goal Goals/treatment plan/discharge plan were made with and agreed upon by patient/family: Yes Time For Goal Achievement: 7 days Wound Therapy - Potential for Goals: Good  Goals will be updated until maximal potential achieved or discharge criteria met.  Discharge criteria: when goals achieved, discharge from hospital, MD decision/surgical intervention, no progress towards goals, refusal/missing three consecutive treatments without notification or medical reason.  GP     Celest Reitz 10/30/2013, 9:21 AM Pager [phone]

## 2013-10-30 NOTE — Progress Notes (Addendum)
   Daily Progress Note  Assessment/Planning: POD #1 s/p L AKA debridement   Waiting wound C&S to narrow abx  Wet-to-dry dressing to L AKA wound BID  Wound care consult  Once wound cleaner, pt will need a L hip dysarticulation per Dr. Scot Dock  Subjective    Pain controlled  Objective Filed Vitals:   10/30/13 0005 10/30/13 0432 10/30/13 0700 10/30/13 0730  BP: 146/53 147/56  155/67  Pulse: 89 92  86  Temp: 98.3 F (36.8 C) 97.8 F (36.6 C) 98.1 F (36.7 C)   TempSrc: Oral Oral Oral   Resp: 13 28  19   Height:      Weight:      SpO2: 91% 95%  97%    Intake/Output Summary (Last 24 hours) at 10/30/13 0850 Last data filed at 10/30/13 0700  Gross per 24 hour  Intake     50 ml  Output    300 ml  Net   -250 ml    PULM  BLL rales CV  RRR GI  soft, NTND VASC  L AKA bandaged, will check wound later  Laboratory CBC    Component Value Date/Time   WBC 18.3* 10/30/2013 0435   WBC 7.1 06/05/2013 1010   HGB 9.5* 10/30/2013 0435   HGB 9.3* 06/05/2013 1010   HCT 30.7* 10/30/2013 0435   HCT 30.8* 06/05/2013 1010   PLT 589* 10/30/2013 0435   PLT 324 06/05/2013 1010    BMET    Component Value Date/Time   NA 130* 10/30/2013 0435   NA 137 06/05/2013 1010   NA 147* 10/12/2011 0959   K 4.1 10/30/2013 0435   K 5.4* 06/05/2013 1010   K 5.2* 10/12/2011 0959   CL 91* 10/30/2013 0435   CL 102 05/19/2012 1427   CL 102 10/12/2011 0959   CO2 26 10/30/2013 0435   CO2 23 06/05/2013 1010   CO2 29 10/12/2011 0959   GLUCOSE 126* 10/30/2013 0435   GLUCOSE 239* 06/05/2013 1010   GLUCOSE 205* 05/19/2012 1427   GLUCOSE 244* 10/12/2011 0959   BUN 9 10/30/2013 0435   BUN 19.5 06/05/2013 1010   BUN 13 10/12/2011 0959   CREATININE 0.86 10/30/2013 0435   CREATININE 1.4* 06/05/2013 1010   CREATININE 1.0 10/12/2011 0959   CALCIUM 8.3* 10/30/2013 0435   CALCIUM 10.1 06/05/2013 1010   CALCIUM 9.0 10/12/2011 0959   GFRNONAA 86* 10/30/2013 0435   GFRAA >90 10/30/2013 Tiburones, MD [facility]  [place] Office: [phone] Pager: [phone]  10/30/2013, 8:50 AM  Addendum  L AKA stump demonstrates some residual ischemic tissue with foul odor on wet-to-dry dressings.  Continue wet-to-dry TID for now.  Continue abx.  Adele Barthel, MD [facility] [place] Office: [phone] Pager: [phone]  10/30/2013, 1:34 PM

## 2013-10-30 NOTE — Evaluation (Signed)
Occupational Therapy Evaluation Patient Details Name: Donald [name] MRN: [id number] DOB: 1945/10/29 Today's Date: 10/30/2013    History of Present Illness 68 yo male s/p Lt aka readmission for CIR for I&D and new sacral wound. HX May 2015 for Lt femoral-popliteal bypass. 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  Patient will benefit from skilled OT acutely to increase independence and safety with ADLS to allow discharge SNF . OT to follow acutely for adl retraining and bed mobility.     Follow Up Recommendations  SNF    Equipment Recommendations  Other (comment) (defer SNF)    Recommendations for Other Services       Precautions / Restrictions Precautions Precautions: Fall Precaution Comments: LT AKA wrap flexiseal foley      Mobility Bed Mobility Overal bed mobility: Needs Assistance Bed Mobility: Supine to Sit;Sit to Supine;Rolling Rolling: Mod assist (min (A) to the right)   Supine to sit: Mod assist;HOB elevated Sit to supine: Min assist   General bed mobility comments: Pt required (A) to shift weight to the left and push up iwth Right UE  Transfers                      Balance Overall balance assessment: Needs assistance Sitting-balance support: Single extremity supported;Feet supported Sitting balance-Leahy Scale: Fair                                      ADL Overall ADL's : Needs assistance/impaired Eating/Feeding: Set up;Sitting Eating/Feeding Details (indicate cue type and reason): drink from straw Grooming: Brushing hair;Min guard                                 General ADL Comments: Pt completed log rolling Rt and Lt for peri care due to leaknig flexiseal. pt completed  supine <>sit EOB. pt static sitting EOB for > 20 minutes min guard (A). Pt required faciliation and (A) to weight shift to complete EOB sitting     Vision                     Perception     Praxis      Pertinent Vitals/Pain VSS     Hand Dominance Right   Extremity/Trunk Assessment Upper Extremity Assessment Upper Extremity Assessment: LUE deficits/detail LUE Deficits / Details: tone present and residual deficits from CIR   Lower Extremity Assessment Lower Extremity Assessment: Defer to PT evaluation   Cervical / Trunk Assessment Cervical / Trunk Assessment: Normal   Communication Communication Communication: No difficulties   Cognition Arousal/Alertness: Awake/alert Behavior During Therapy: Anxious Overall Cognitive Status: Within Functional Limits for tasks assessed                     General Comments       Exercises       Shoulder Instructions      Home Living Family/patient expects to be discharged to:: Skilled nursing facility  Additional Comments: Pt has had x2 CIR admissions and currently requires SNF level care.      Prior Functioning/Environment Level of Independence: Needs assistance    ADL's / Homemaking Assistance Needed: Max (A) for all adls since may 2015        OT Diagnosis: Generalized weakness;Acute pain   OT Problem List: Decreased strength;Decreased activity tolerance;Impaired balance (sitting and/or standing);Decreased safety awareness;Decreased knowledge of use of DME or AE;Decreased knowledge of precautions;Pain   OT Treatment/Interventions: Self-care/ADL training;Therapeutic exercise;Neuromuscular education;DME and/or AE instruction;Therapeutic activities;Patient/family education;Balance training    OT Goals(Current goals can be found in the care plan section) Acute Rehab OT Goals Patient Stated Goal: to get back  home to cats OT Goal Formulation: With  patient/family Time For Goal Achievement: 11/13/13 Potential to Achieve Goals: Good  OT Frequency: Min 2X/week   Barriers to D/C:            Co-evaluation              End of Session Nurse Communication: Mobility status;Precautions  Activity Tolerance: Patient tolerated treatment well Patient left: in bed;with call bell/phone within reach   Time: 1422-1452 OT Time Calculation (min): 30 min Charges:  OT General Charges $OT Visit: 1 Procedure OT Evaluation $Initial OT Evaluation Tier I: 1 Procedure OT Treatments $Self Care/Home Management : 8-22 mins G-Codes:    Peri Maris Nov 03, 2013, 3:36 PM Pager: [phone]

## 2013-10-30 NOTE — Evaluation (Signed)
Physical Therapy Evaluation Patient Details Name: Donald [name] MRN: [id number] DOB: [birth year] Today's Date: 10/30/2013   History of Present Illness  68 yo male s/p Lt aka readmission for CIR for I&D and new sacral wound. HX May 2015 for Lt femoral-popliteal bypass. pt has hx of Lt side affect CVA, Lt aka 10/09/13 rectal CA (2009) with metastases in liver(2012) , HTN, gout, PVD, Ferd, CAD, and DM2   Clinical Impression  Patient presents with decreased independence with mobility due to deficits listed in PT problem list.  He will benefit from skilled PT in the acute setting to allow return home with wife assist following SNF level rehab stay.    Follow Up Recommendations SNF;Supervision/Assistance - 24 hour    Equipment Recommendations  Other (comment) (TBA)    Recommendations for Other Services       Precautions / Restrictions Precautions Precautions: Fall Precaution Comments: LT AKA wrap flexiseal foley      Mobility  Bed Mobility Overal bed mobility: Needs Assistance Bed Mobility: Supine to Sit;Sit to Supine;Rolling Rolling: Mod assist (to left, min assist to right)   Supine to sit: Mod assist;HOB elevated Sit to supine: Min assist   General bed mobility comments: Pt required (A) to shift weight to the left and push up iwth Right UE  Transfers Overall transfer level:  (NT, patient stated could not tolerate today)                  Ambulation/Gait                Stairs            Wheelchair Mobility    Modified Rankin (Stroke Patients Only)       Balance Overall balance assessment: Needs assistance Sitting-balance support: Feet supported;No upper extremity supported;Single extremity supported Sitting balance-Leahy Scale: Fair Sitting balance - Comments: sat edge of bed x 20 minutes performing right UE reaching, rotation, head rotation and extension; prefers to sit with right UE support                                      Pertinent Vitals/Pain 7/10 left AKA    Home Living Family/patient expects to be discharged to:: Skilled nursing facility Living Arrangements: Spouse/significant other               Additional Comments: Pt has had x2 CIR admissions and currently requires SNF level care.    Prior Function Level of Independence: Needs assistance      ADL's / Homemaking Assistance Needed: Max (A) for all adls since may 2015  Comments: since previous CIR, wife was assisting with bathing and dressing, per last CIR notes, was ambulating with RW at S level, likes to watch movies at home.      Hand Dominance   Dominant Hand: Right    Extremity/Trunk Assessment   Upper Extremity Assessment: Defer to OT evaluation       LUE Deficits / Details: tone present and residual deficits from CIR   Lower Extremity Assessment: Defer to PT evaluation RLE Deficits / Details: tight hip abductors and external rotators; strength 3 to 4/5  LLE Deficits / Details: moves minimally into flexion   Cervical / Trunk Assessment: Normal  Communication   Communication: No difficulties  Cognition Arousal/Alertness: Awake/alert Behavior During Therapy: Anxious Overall Cognitive Status: Within Functional Limits for tasks assessed  General Comments      Exercises        Assessment/Plan    PT Assessment Patient needs continued PT services  PT Diagnosis Generalized weakness;Acute pain   PT Problem List Decreased strength;Decreased activity tolerance;Decreased balance;Decreased mobility;Decreased range of motion;Pain;Decreased skin integrity  PT Treatment Interventions DME instruction;Functional mobility training;Therapeutic activities;Patient/family education;Balance training;Therapeutic exercise;Wheelchair mobility training   PT Goals (Current goals can be found in the Care Plan section) Acute Rehab PT Goals Patient Stated Goal: to get back  home to cats PT Goal Formulation:  With patient Time For Goal Achievement: 11/13/13 Potential to Achieve Goals: Fair    Frequency Min 3X/week   Barriers to discharge        Co-evaluation PT/OT/SLP Co-Evaluation/Treatment: Yes Reason for Co-Treatment: Complexity of the patient's impairments (multi-system involvement) PT goals addressed during session: Strengthening/ROM;Balance;Mobility/safety with mobility         End of Session   Activity Tolerance: Patient limited by fatigue;Patient limited by pain Patient left: in bed;with call bell/phone within reach;with family/visitor present           Time: 9562-1308 PT Time Calculation (min): 30 min   Charges:   PT Evaluation $Initial PT Evaluation Tier I: 1 Procedure PT Treatments $Therapeutic Activity: 8-22 mins   PT G Codes:          Edit Ricciardelli,CYNDI 2013/11/14, 4:13 PM Magda Kiel, [place] 2013/11/14

## 2013-10-30 NOTE — Progress Notes (Signed)
ANTIBIOTIC CONSULT NOTE - Follow Up  Pharmacy Consult for Vancomycin, Zosyn Indication: stump infection  Allergies  Allergen Reactions  . Other     Became hypotensive. Pt states allergic to "muscle relaxer" but unsure of name.  . Tape Hives and Itching    medfix non woven tape    Patient Measurements: Height: 5\' 9"  (175.3 cm) Weight: 151 lb 10.8 oz (68.8 kg) IBW/kg (Calculated) : 70.7   Labs:  Recent Labs  10/28/13 0500 10/29/13 1735 10/30/13 0435  WBC 15.8* 19.0* 18.3*  HGB 10.5* 9.2* 9.5*  PLT 609* 581* 589*  CREATININE 0.69 0.65 0.86     Microbiology: Recent Results (from the past 720 hour(s))  SURGICAL PCR SCREEN     Status: None   Collection Time    10/08/13 12:12 AM      Result Value Ref Range Status   MRSA, PCR NEGATIVE  NEGATIVE Final   Staphylococcus aureus NEGATIVE  NEGATIVE Final   Comment:            The Xpert SA Assay (FDA     approved for NASAL specimens     in patients over 61 years of age),     is one component of     a comprehensive surveillance     program.  Test performance has     been validated by [employer] for patients greater     than or equal to 75 year old.     It is not intended     to diagnose infection nor to     guide or monitor treatment.  URINE CULTURE     Status: None   Collection Time    10/27/13  5:13 PM      Result Value Ref Range Status   Specimen Description URINE, CLEAN CATCH   Final   Special Requests NONE   Final   Culture  Setup Time     Final   Value: 10/27/2013 19:13     Performed at [facility]     Final   Value: >=100,000 COLONIES/ML     Performed at [facility]   Culture     Final   Value: Multiple bacterial morphotypes present, none predominant. Suggest appropriate recollection if clinically indicated.     Performed at [facility]   Report Status 10/28/2013 FINAL   Final  WOUND CULTURE     Status: None   Collection Time    10/29/13 10:25 AM      Result  Value Ref Range Status   Specimen Description WOUND LEFT LEG   Final   Special Requests LEFT ABOVE KNEE PT ON VANCO ZOSYN   Final   Gram Stain     Final   Value: ABUNDANT WBC PRESENT, PREDOMINANTLY PMN     NO SQUAMOUS EPITHELIAL CELLS SEEN     MODERATE GRAM POSITIVE COCCI     IN PAIRS FEW GRAM NEGATIVE RODS     Performed at [facility]   Culture     Final   Value: Culture reincubated for better growth     Performed at [facility]   Report Status PENDING   Incomplete  ANAEROBIC CULTURE     Status: None   Collection Time    10/29/13 10:25 AM      Result Value Ref Range Status   Specimen Description WOUND LEFT LEG   Final   Special Requests LEFT ABOVE  KNEE PT ON VANCO ZOSYN   Final   Gram Stain     Final   Value: MODERATE WBC PRESENT, PREDOMINANTLY PMN     NO SQUAMOUS EPITHELIAL CELLS SEEN     MODERATE GRAM POSITIVE COCCI     IN PAIRS FEW GRAM NEGATIVE RODS     Performed at [facility]   Culture PENDING   Incomplete   Report Status PENDING   Incomplete    Medical History: Past Medical History  Diagnosis Date  . GERD (gastroesophageal reflux disease)   . Hyperlipidemia   . Hypertension   . Peripheral vascular disease   . Internal and external bleeding hemorrhoids   . History of gout     left great toe  . Depression   . Numbness in right leg     Hx: of diabetes  . Coronary artery disease     RCA occlusion with good collaterals, o/w no obs CAD 12/2011  . Pneumonia ~ 1977  . Type II diabetes mellitus   . History of blood transfusion 12/2011; 12/2012    "before OR; after OR" (01/22/2013)  . Stroke 2008    "left side weak; unable to move left hand still" (01/22/2013)  . Rectal cancer dx'd 08/2007    S/P chemo, radiation, biopsies  . Metastases to the liver dx'd 08/2010    Assessment: 68 year old male with stump wound infection, No Cx data to date, afebrile, WBC 18 -slightly improved.  Continues of broad spectrum ABX - zosyn and vancomycin - VT 19 on  1Gm q12 Cr has remained stable  Goal of Therapy:  Vancomycin trough 15-20  Plan:  1) Vancomycin 1 Gram iv Q 12 hours 2) Zosyn 3.375 grams iv Q 8 hours - 4 hr infusion 3) Follow up Scr, cultures, progress  Bonnita Nasuti Pharm.D. CPP, BCPS Clinical Pharmacist [phone] 10/30/2013 6:21 PM

## 2013-10-31 ENCOUNTER — Ambulatory Visit (HOSPITAL_COMMUNITY): Payer: Self-pay

## 2013-10-31 LAB — BASIC METABOLIC PANEL
Anion gap: 13 (ref 5–15)
BUN: 10 mg/dL (ref 6–23)
CO2: 25 mEq/L (ref 19–32)
Calcium: 8.3 mg/dL — ABNORMAL LOW (ref 8.4–10.5)
Chloride: 96 mEq/L (ref 96–112)
Creatinine, Ser: 1.1 mg/dL (ref 0.50–1.35)
GFR, EST AFRICAN AMERICAN: 77 mL/min — AB (ref >90)
GFR, EST NON AFRICAN AMERICAN: 67 mL/min — AB (ref >90)
Glucose, Bld: 87 mg/dL (ref 70–99)
POTASSIUM: 4.1 meq/L (ref 3.7–5.3)
SODIUM: 134 meq/L — AB (ref 137–147)

## 2013-10-31 LAB — CBC
HCT: 31.2 % — ABNORMAL LOW (ref 39.0–52.0)
Hemoglobin: 9.7 g/dL — ABNORMAL LOW (ref 13.0–17.0)
MCH: 26.5 pg (ref 26.0–34.0)
MCHC: 31.1 g/dL (ref 30.0–36.0)
MCV: 85.2 fL (ref 78.0–100.0)
PLATELETS: 571 10*3/uL — AB (ref 150–400)
RBC: 3.66 MIL/uL — AB (ref 4.22–5.81)
RDW: 20.2 % — AB (ref 11.5–15.5)
WBC: 19.9 10*3/uL — AB (ref 4.0–10.5)

## 2013-10-31 LAB — GLUCOSE, CAPILLARY
GLUCOSE-CAPILLARY: 59 mg/dL — AB (ref 70–99)
Glucose-Capillary: 76 mg/dL (ref 70–99)
Glucose-Capillary: 130 mg/dL — ABNORMAL HIGH (ref 70–99)
Glucose-Capillary: 138 mg/dL — ABNORMAL HIGH (ref 70–99)
Glucose-Capillary: 104 mg/dL — ABNORMAL HIGH (ref 70–99)
Glucose-Capillary: 84 mg/dL (ref 70–99)

## 2013-10-31 MED ORDER — GLUCOSE 40 % PO GEL
ORAL | Status: AC
  Administered 2013-10-31: 37.5 g
  Filled 2013-10-31: qty 1

## 2013-10-31 NOTE — Progress Notes (Signed)
   Daily Progress Note  Assessment/Planning: POD #2 s/p L AKA debridement, sacral decubitus   Awaiting wound C&S  Cont local wound care to L AKA wound  Improved L AKA wound bed.  May require washout in OR to get   Consult Dr. Sharol Given on Monday for hip dysarticulation  Subjective  Some runs of aflutter noted  Objective Filed Vitals:   10/31/13 0015 10/31/13 0352 10/31/13 0700 10/31/13 0800  BP: 114/58 140/53  153/62  Pulse: 100   68  Temp: 99.5 F (37.5 C) 97.6 F (36.4 C) 98.6 F (37 C)   TempSrc: Oral Oral Oral   Resp: 17 21  17   Height:      Weight:      SpO2: 92%   94%    Intake/Output Summary (Last 24 hours) at 10/31/13 0956 Last data filed at 10/31/13 0900  Gross per 24 hour  Intake   2635 ml  Output   1575 ml  Net   1060 ml    PULM  BLL rales CV  RRR GI  soft, NTND VASC  L AKA: cleaner, no purulence, decreased smell, continued ischemic tissue bed  Laboratory CBC    Component Value Date/Time   WBC 19.9* 10/31/2013 0500   WBC 7.1 06/05/2013 1010   HGB 9.7* 10/31/2013 0500   HGB 9.3* 06/05/2013 1010   HCT 31.2* 10/31/2013 0500   HCT 30.8* 06/05/2013 1010   PLT 571* 10/31/2013 0500   PLT 324 06/05/2013 1010    BMET    Component Value Date/Time   NA 134* 10/31/2013 0500   NA 137 06/05/2013 1010   NA 147* 10/12/2011 0959   K 4.1 10/31/2013 0500   K 5.4* 06/05/2013 1010   K 5.2* 10/12/2011 0959   CL 96 10/31/2013 0500   CL 102 05/19/2012 1427   CL 102 10/12/2011 0959   CO2 25 10/31/2013 0500   CO2 23 06/05/2013 1010   CO2 29 10/12/2011 0959   GLUCOSE 87 10/31/2013 0500   GLUCOSE 239* 06/05/2013 1010   GLUCOSE 205* 05/19/2012 1427   GLUCOSE 244* 10/12/2011 0959   BUN 10 10/31/2013 0500   BUN 19.5 06/05/2013 1010   BUN 13 10/12/2011 0959   CREATININE 1.10 10/31/2013 0500   CREATININE 1.4* 06/05/2013 1010   CREATININE 1.0 10/12/2011 0959   CALCIUM 8.3* 10/31/2013 0500   CALCIUM 10.1 06/05/2013 1010   CALCIUM 9.0 10/12/2011 0959   GFRNONAA 67* 10/31/2013 0500   GFRAA 77* 10/31/2013 0500     Adele Barthel, MD [facility] Office: [phone] Pager: [phone]  10/31/2013, 9:56 AM

## 2013-10-31 NOTE — Progress Notes (Signed)
Physical Therapy Wound Treatment Patient Details  Name: Donald [name] MRN: [id number] Date of Birth: 20-Feb-1946  Today's Date: 10/31/2013 Time: 0827-0931 Time Calculation (min): 64 min  Subjective  Patient and Family Stated Goals: decr pain and heal wound Prior Treatments: "every kind of cream you can imagine"  Pain Score:   10/10 some areas during ultrasonic mist; pre-medicated for pain   Wound Assessment  Clinical Statement: On arrival, flexiseal out (lying in pt's bed near rectum) with stool covering dressing and under dressing. RN in to assist with cleaning pt and replacing flexiseal.  Slight incr in red granulation buds showing through the adherent yellow slough.   Wound / Incision (Open or Dehisced) 10/13/13 Other (Comment) Buttocks Left;Distal MSDA (Active)  Dressing Type Gauze (Comment);Other (Comment);ABD;Barrier Film (skin prep);Moist to moist 10/31/2013  9:32 AM  Dressing Changed Changed 10/31/2013  9:32 AM  Dressing Status Clean;Intact;Dry 10/31/2013  9:32 AM  Dressing Change Frequency Daily 10/31/2013  9:32 AM  Site / Wound Assessment Painful;Brown;Granulation tissue;Pink;Yellow 10/31/2013  9:32 AM  % Wound base Red or Granulating 5% 10/31/2013  9:32 AM  % Wound base Yellow 80% 10/31/2013  9:32 AM  % Wound base Black 0% 10/31/2013  9:32 AM  % Wound base Other (Comment) 15% 10/31/2013  9:32 AM  Peri-wound Assessment Erythema (non-blanchable);Pink;Excoriated 10/31/2013  9:32 AM  Wound Length (cm) 7.8 cm 10/22/2013  4:01 PM  Wound Width (cm) 5.7 cm 10/22/2013  4:01 PM  Wound Depth (cm) 0.2 cm 10/13/2013  5:12 PM  Margins Unattached edges (unapproximated) 10/31/2013  9:32 AM  Closure None 10/31/2013  9:32 AM  Drainage Amount Other (Comment) 10/31/2013  9:32 AM  Drainage Description Serous 10/31/2013  9:32 AM  Non-staged Wound Description Partial thickness 10/31/2013  9:32 AM  Treatment Debridement (Selective);Hydrotherapy (Ultrasonic mist) 10/31/2013  9:32 AM     Wound / Incision (Open or Dehisced)  10/13/13 Other (Comment) Buttocks Left;Proximal MSDA; includes 9 open areas proximal to large buttock wound (Active)  Dressing Type Gauze (Comment);Other (Comment);ABD;Barrier Film (skin prep);Moist to moist 10/31/2013  9:32 AM  Dressing Changed Changed 10/31/2013  9:32 AM  Dressing Status Clean;Dry;Intact 10/31/2013  9:32 AM  Dressing Change Frequency Daily 10/31/2013  9:32 AM  Site / Wound Assessment Painful;Pink;Yellow 10/31/2013  9:32 AM  % Wound base Red or Granulating 5% 10/31/2013  9:32 AM  % Wound base Yellow 95% 10/31/2013  9:32 AM  % Wound base Black 0% 10/31/2013  9:32 AM  % Wound base Other (Comment) 0% 10/31/2013  9:32 AM  Peri-wound Assessment Erythema (non-blanchable);Excoriated;Pink 10/31/2013  9:32 AM  Wound Length (cm) 6.5 cm 10/22/2013  4:01 PM  Wound Width (cm) 5.8 cm 10/22/2013  4:01 PM  Wound Depth (cm) 1 cm 10/13/2013  5:12 PM  Tunneling (cm) 0.2 10/13/2013  5:12 PM  Margins Unattached edges (unapproximated) 10/31/2013  9:32 AM  Closure None 10/31/2013  9:32 AM  Drainage Amount Minimal 10/31/2013  9:32 AM  Drainage Description Serous 10/31/2013  9:32 AM  Non-staged Wound Description Partial thickness 10/31/2013  9:32 AM  Treatment Debridement (Selective);Hydrotherapy (Ultrasonic mist) 10/31/2013  9:32 AM      Hydrotherapy Ultrasonic mist  - wound location: Lt buttock  Ultrasonic mist at 35KHz (+/-3KHz) at ___ percent: 100 % (had to decrease to 50% for contact) Ultrasonic mist therapy minutes: 8 min (4 min non-contact; 4 minutes contact) Selective Debridement Selective Debridement - Location: Lt buttock wounds Selective Debridement - Tools Used: Other (comment);Forceps;Scalpel (tip of ultrasound wand) Selective Debridement - Tissue Removed:  yellow, brown slough (and scored to increase surface area)   Wound Assessment and Plan  Wound Therapy - Assess/Plan/Recommendations Wound Therapy - Clinical Statement: On arrival, flexiseal out (lying in pt's bed near rectum) with stool covering dressing and  under dressing. RN in to assist with cleaning pt and replacing flexiseal. (during her attempt to place, pt twice accidentally pulled tube out when moving his RLE from flexed to extended position). As treatment beginning, pt again moved his leg is a way that pulled flexiseal out. After treatment, but before dressing applied, RN in again to reinsert flexiseal. Slight incr in red granulation buds showing through the adherent yellow slough.  Wound Therapy - Functional Problem List: limiting all mobility per PT & OT due to pain Factors Delaying/Impairing Wound Healing: Diabetes Mellitus;Immobility;Multiple medical problems Hydrotherapy Plan: Debridement;Dressing change;Patient/family education;Ultrasonic wound therapy _0  KHz (+/- 3 KHz) Wound Therapy - Frequency: 6X / week Wound Therapy - Current Recommendations: WOC nurse Wound Therapy - Follow Up Recommendations: Skilled nursing facility Wound Plan: see above  Wound Therapy Goals- Improve the function of patient's integumentary system by progressing the wound(s) through the phases of wound healing (inflammation - proliferation - remodeling) by: Decrease Necrotic Tissue to: 70 Decrease Necrotic Tissue - Progress: Progressing toward goal Increase Granulation Tissue to: 30 Increase Granulation Tissue - Progress: Progressing toward goal Goals/treatment plan/discharge plan were made with and agreed upon by patient/family: Yes Time For Goal Achievement: 7 days Wound Therapy - Potential for Goals: Good  Goals will be updated until maximal potential achieved or discharge criteria met.  Discharge criteria: when goals achieved, discharge from hospital, MD decision/surgical intervention, no progress towards goals, refusal/missing three consecutive treatments without notification or medical reason.  GP     Aleighna Wojtas 10/31/2013, 9:40 AM Pager [phone]

## 2013-10-31 NOTE — Progress Notes (Signed)
Pt converted from NSR with frequent PVCs (bi/trigeminy) to Aflutter/Afib. Pt asymptomatic, resting with no complaints of pain or SOB. MD(Dr Bridgett Larsson) notified, EKG obtained. No new orders received, will continue monitoring

## 2013-11-01 DIAGNOSIS — I4892 Unspecified atrial flutter: Secondary | ICD-10-CM

## 2013-11-01 LAB — CBC
HCT: 29.8 % — ABNORMAL LOW (ref 39.0–52.0)
HEMOGLOBIN: 9.1 g/dL — AB (ref 13.0–17.0)
MCH: 26.2 pg (ref 26.0–34.0)
MCHC: 30.5 g/dL (ref 30.0–36.0)
MCV: 85.9 fL (ref 78.0–100.0)
PLATELETS: 572 10*3/uL — AB (ref 150–400)
RBC: 3.47 MIL/uL — ABNORMAL LOW (ref 4.22–5.81)
RDW: 20.1 % — ABNORMAL HIGH (ref 11.5–15.5)
WBC: 17.6 10*3/uL — AB (ref 4.0–10.5)

## 2013-11-01 LAB — GLUCOSE, CAPILLARY
GLUCOSE-CAPILLARY: 115 mg/dL — AB (ref 70–99)
Glucose-Capillary: 191 mg/dL — ABNORMAL HIGH (ref 70–99)
Glucose-Capillary: 84 mg/dL (ref 70–99)

## 2013-11-01 LAB — BASIC METABOLIC PANEL
Anion gap: 13 (ref 5–15)
BUN: 10 mg/dL (ref 6–23)
CO2: 25 mEq/L (ref 19–32)
CREATININE: 1.09 mg/dL (ref 0.50–1.35)
Calcium: 8.1 mg/dL — ABNORMAL LOW (ref 8.4–10.5)
Chloride: 98 mEq/L (ref 96–112)
GFR calc non Af Amer: 67 mL/min — ABNORMAL LOW (ref >90)
GFR, EST AFRICAN AMERICAN: 78 mL/min — AB (ref >90)
Glucose, Bld: 93 mg/dL (ref 70–99)
POTASSIUM: 4.2 meq/L (ref 3.7–5.3)
Sodium: 136 mEq/L — ABNORMAL LOW (ref 137–147)

## 2013-11-01 LAB — MAGNESIUM: MAGNESIUM: 2.2 mg/dL (ref 1.5–2.5)

## 2013-11-01 MED ORDER — METOPROLOL TARTRATE 12.5 MG HALF TABLET
12.5000 mg | ORAL_TABLET | Freq: Four times a day (QID) | ORAL | Status: DC | PRN
  Filled 2013-11-01: qty 1

## 2013-11-01 NOTE — Progress Notes (Signed)
   Daily Progress Note  Assessment/Planning: POD #3 s/p L AKA debridement, sacral decubitus  Awaiting wound C&S  Cont local wound care to L AKA wound  Consult Dr. Sharol Given on Monday for hip dysarticulation  Subjective    No events overnight, no complaints  Objective Filed Vitals:   10/31/13 1927 10/31/13 2329 11/01/13 0319 11/01/13 0700  BP: 127/51 152/59 140/68   Pulse:  93 88   Temp: 98.2 F (36.8 C) 98.1 F (36.7 C) 98.3 F (36.8 C) 98.1 F (36.7 C)  TempSrc: Oral Oral Oral   Resp: 20 20 12    Height:      Weight:      SpO2: 100% 91% 97%     Intake/Output Summary (Last 24 hours) at 11/01/13 0844 Last data filed at 11/01/13 0700  Gross per 24 hour  Intake 1662.5 ml  Output    875 ml  Net  787.5 ml    PULM BLL rales  CV RRR  GI soft, NTND  VASC L AKA: cleaner, no purulence, decreased smell, some ischemic muscle evident  Laboratory CBC    Component Value Date/Time   WBC 17.6* 11/01/2013 0545   WBC 7.1 06/05/2013 1010   HGB 9.1* 11/01/2013 0545   HGB 9.3* 06/05/2013 1010   HCT 29.8* 11/01/2013 0545   HCT 30.8* 06/05/2013 1010   PLT 572* 11/01/2013 0545   PLT 324 06/05/2013 1010    BMET    Component Value Date/Time   NA 136* 11/01/2013 0030   NA 137 06/05/2013 1010   NA 147* 10/12/2011 0959   K 4.2 11/01/2013 0030   K 5.4* 06/05/2013 1010   K 5.2* 10/12/2011 0959   CL 98 11/01/2013 0030   CL 102 05/19/2012 1427   CL 102 10/12/2011 0959   CO2 25 11/01/2013 0030   CO2 23 06/05/2013 1010   CO2 29 10/12/2011 0959   GLUCOSE 93 11/01/2013 0030   GLUCOSE 239* 06/05/2013 1010   GLUCOSE 205* 05/19/2012 1427   GLUCOSE 244* 10/12/2011 0959   BUN 10 11/01/2013 0030   BUN 19.5 06/05/2013 1010   BUN 13 10/12/2011 0959   CREATININE 1.09 11/01/2013 0030   CREATININE 1.4* 06/05/2013 1010   CREATININE 1.0 10/12/2011 0959   CALCIUM 8.1* 11/01/2013 0030   CALCIUM 10.1 06/05/2013 1010   CALCIUM 9.0 10/12/2011 0959   GFRNONAA 67* 11/01/2013 0030   GFRAA 78* 11/01/2013 0030    Adele Barthel, MD [facility] Office: [phone] Pager: [phone]  11/01/2013, 8:44 AM

## 2013-11-01 NOTE — Progress Notes (Signed)
Paged Dr. Bridgett Larsson at [phone] regarding pt heart rhythm converting from SR to A-fib. Dr. Bridgett Larsson stated he will call cardiology.

## 2013-11-01 NOTE — Consult Note (Signed)
Reason for Consult: atrial flutter Referring Physician: Dr. [name] is an 68 y.o. male.  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 There is concern he may need a hip disarticulation and orthopaedics will see him tomorrow. Cardiology consulted for new onset atrial arrhythmia beginning 7/4. He spontaneously went back to NSR but went back into atrial flutter today leading to consultation. On my arrival, he was in NSR and review of telemetry showed what appeared to potentially be MAT as well. He tells me he doesn't know when he's in atrial arrhythmia. He's accompanied by multiple family members. We discussed the risks of stroke with atrial arrhythmias but being on a blood thinner makes him very nervous given his significant wound of his left leg and he wishes to defer at this time and discuss further with his family.      Past Medical History  Diagnosis Date  . GERD (gastroesophageal reflux disease)   . Hyperlipidemia   . Hypertension   . Peripheral vascular disease   . Internal and external bleeding hemorrhoids   . History of gout     left great toe  . Depression   . Numbness in right leg     Hx: of diabetes  . Coronary artery disease     RCA occlusion with good collaterals, o/w no obs CAD 12/2011  . Pneumonia ~ 1977  . Type II diabetes mellitus   . History of blood transfusion 12/2011; 12/2012    "before OR; after OR" (01/22/2013)  . Stroke 2008    "left side weak; unable to move left hand still" (01/22/2013)  . Rectal cancer dx'd 08/2007    S/P chemo, radiation, biopsies  .  Metastases to the liver dx'd 08/2010    Past Surgical History  Procedure Laterality Date  . Carotid endarterectomy Right 2008  . Liver cryoablation  08/2010    "chemo shrunk the cancer 50% then they went in and burned the rest of it"  . Axillary-femoral bypass graft  01/29/2012    Procedure: BYPASS GRAFT AXILLA-BIFEMORAL;  Surgeon: Elam Dutch, MD;  Location: [facility] OR;  Service: Vascular;  Laterality: N/A;  Left axilla-bifemoral bypass using gore-tex graft.   . Colonoscopy w/ biopsies and polypectomy      Hx: of  . Femoral-popliteal bypass graft Left 01/06/2013    Procedure: Femoral-Popliteal Artery Bypass;  Surgeon: Elam Dutch, MD;  Location: [place];  Service: Vascular;  Laterality: Left;  . Intraoperative arteriogram Left 01/06/2013    Procedure: INTRA OPERATIVE ARTERIOGRAM;  Surgeon: Elam Dutch, MD;  Location: [place];  Service: Vascular;  Laterality: Left;  . Femoral-popliteal bypass graft Left 01/10/2013    Procedure: THROMBECTOMY POSSIBLE BYPASS GRAFT FEMORAL-POPLITEAL ARTERY;  Surgeon: Mal Misty, MD;  Location: [place];  Service: Vascular;  Laterality: Left;  Attempted Thrombectomy of left Femoral/Popliteal graft - Unsuccessful.  Insertion of Left femoral to below the knee popliteal propaten graft.  . Mass biopsy  2009    "found mass that was cancer; later had chemo and radiation; never cut mass out" (  01/22/2013)  . Femoral-popliteal bypass graft Left 09/05/2013    Procedure: Thrombectomy of Left Femoral-Below Knee Popliteal Bypass Graft; Patch Angioplasty of Distal Portion of Bypass Graft;  Surgeon: Angelia Mould, MD;  Location: [place];  Service: Vascular;  Laterality: Left;  . Patch angioplasty Left 09/05/2013    Procedure: Patch Angioplasty Distal Portion of Left Femoral-Below Knee Popliteal Bypass Graft;  Surgeon: Angelia Mould, MD;  Location: [place];  Service: Vascular;  Laterality: Left;  . Intraoperative arteriogram Left 09/05/2013    Procedure: INTRA OPERATIVE  ARTERIOGRAM;  Surgeon: Angelia Mould, MD;  Location: [place];  Service: Vascular;  Laterality: Left;  . Fasciotomy Left 09/05/2013    Procedure: FASCIOTOMY;  Surgeon: Angelia Mould, MD;  Location: [place];  Service: Vascular;  Laterality: Left;  Marland Kitchen Vascular surgery    . Fasciotomy closure Left 09/11/2013    Procedure: CLOSURE OF FASCIOTOMY SITES LEFT LOWER EXTREMITY AND WOUND VAC APPLICATION;  Surgeon: Angelia Mould, MD;  Location: [place];  Service: Vascular;  Laterality: Left;  . Amputation Left 10/08/2013    Procedure: LEFT ABOVE KNEE AMPUTATION;  Surgeon: Serafina Mitchell, MD;  Location: [facility] OR;  Service: Vascular;  Laterality: Left;    Family History  Problem Relation Age of Onset  . Cancer - Other Sister     Social History:  reports that he has quit smoking. His smoking use included Cigarettes. He has a 5.7 pack-year smoking history. He has never used smokeless tobacco. He reports that he does not drink alcohol or use illicit drugs.  Allergies:  Allergies  Allergen Reactions  . Other     Became hypotensive. Pt states allergic to "muscle relaxer" but unsure of name.  . Tape Hives and Itching    medfix non woven tape    Medications:  I have reviewed the patient's current medications. Prior to Admission:  Prescriptions prior to admission  Medication Sig Dispense Refill  . Ascorbic Acid (VITAMIN C) 1000 MG tablet Take 500 mg by mouth daily.       Marland Kitchen dipyridamole-aspirin (AGGRENOX) 200-25 MG per 12 hr capsule Take 1 capsule by mouth 2 (two) times daily.      Marland Kitchen docusate sodium (COLACE) 100 MG capsule Take 200 mg by mouth 2 (two) times daily as needed for mild constipation.      . docusate sodium 100 MG CAPS Take 100 mg by mouth 2 (two) times daily.  10 capsule  0  . DULoxetine (CYMBALTA) 30 MG capsule Take 1 capsule (30 mg total) by mouth daily.  30 capsule  1  . feeding supplement, GLUCERNA SHAKE, (GLUCERNA SHAKE) LIQD Take 237 mLs by mouth daily.    0  . ferrous  fumarate (HEMOCYTE - 106 MG FE) 325 (106 FE) MG TABS tablet Take 1 tablet (106 mg of iron total) by mouth 2 (two) times daily.  60 each  1  . ferrous sulfate 325 (65 FE) MG tablet Take 1 tablet (325 mg total) by mouth 2 (two) times daily with a meal.  60 tablet  0  . gabapentin (NEURONTIN) 300 MG capsule Take 1 capsule (300 mg total) by mouth 3 (three) times daily.  90 capsule  6  . insulin detemir (LEVEMIR) 100 UNIT/ML injection Inject 10 Units into the skin daily.      Marland Kitchen lisinopril (PRINIVIL,ZESTRIL) 5 MG tablet Take 2.5 mg by mouth daily.      . metFORMIN (GLUCOPHAGE) 500 MG tablet Take 500 mg by mouth 2 (  two) times daily with a meal.      . omega-3 acid ethyl esters (LOVAZA) 1 G capsule Take 2 g by mouth 2 (two) times daily.       Marland Kitchen omeprazole (PRILOSEC) 40 MG capsule Take 40 mg by mouth daily.      Marland Kitchen oxyCODONE (OXY IR/ROXICODONE) 5 MG immediate release tablet Take 1-2 tablets (5-10 mg total) by mouth every 6 (six) hours as needed for severe pain.  120 tablet  0  . OxyCODONE (OXYCONTIN) 40 mg T12A 12 hr tablet Take 1 tablet (40 mg total) by mouth every 12 (twelve) hours.  60 tablet  0  . oxyCODONE-acetaminophen (PERCOCET/ROXICET) 5-325 MG per tablet Take 1 tablet by mouth every 4 (four) hours as needed for moderate pain or severe pain.      Marland Kitchen saccharomyces boulardii (FLORASTOR) 250 MG capsule Take 1 capsule (250 mg total) by mouth 2 (two) times daily. probiotic  60 capsule  1  . senna (SENOKOT) 8.6 MG TABS tablet Take 2 tablets (17.2 mg total) by mouth at bedtime.  120 each  0  . traMADol (ULTRAM) 50 MG tablet Take 1 tablet (50 mg total) by mouth every 6 (six) hours as needed for moderate pain.  45 tablet  0  . traZODone (DESYREL) 50 MG tablet Take 0.5-1 tablets (25-50 mg total) by mouth at bedtime as needed for sleep.  30 tablet  0   Scheduled: . collagenase   Topical Daily  . dipyridamole-aspirin  1 capsule Oral BID  . DULoxetine  30 mg Oral Daily  . enoxaparin (LOVENOX) injection  40 mg  Subcutaneous Q24H  . feeding supplement (ENSURE COMPLETE)  237 mL Oral TID BM  . ferrous fumarate  1 tablet Oral BID  . ferrous sulfate  325 mg Oral BID WC  . gabapentin  300 mg Oral TID  . insulin aspart  0-5 Units Subcutaneous QHS  . insulin aspart  0-9 Units Subcutaneous TID WC  . insulin detemir  10 Units Subcutaneous Daily  . lisinopril  2.5 mg Oral Daily  . metFORMIN  500 mg Oral BID WC  . multivitamin with minerals  1 tablet Oral Daily  . omega-3 acid ethyl esters  2 g Oral BID  . OxyCODONE  40 mg Oral Q12H  . pantoprazole  40 mg Oral Daily  . piperacillin-tazobactam (ZOSYN)  IV  3.375 g Intravenous Q8H  . saccharomyces boulardii  250 mg Oral BID  . vancomycin  1,000 mg Intravenous Q12H  . vitamin C  500 mg Oral Daily    Results for orders placed during the hospital encounter of 10/29/13 (from the past 48 hour(s))  GLUCOSE, CAPILLARY     Status: None   Collection Time    10/30/13  9:58 PM      Result Value Ref Range   Glucose-Capillary 76  70 - 99 mg/dL  BASIC METABOLIC PANEL     Status: Abnormal   Collection Time    10/31/13  5:00 AM      Result Value Ref Range   Sodium 134 (*) 137 - 147 mEq/L   Potassium 4.1  3.7 - 5.3 mEq/L   Chloride 96  96 - 112 mEq/L   CO2 25  19 - 32 mEq/L   Glucose, Bld 87  70 - 99 mg/dL   BUN 10  6 - 23 mg/dL   Creatinine, Ser 1.10  0.50 - 1.35 mg/dL   Calcium 8.3 (*) 8.4 - 10.5 mg/dL   GFR  calc non Af Amer 67 (*) >90 mL/min   GFR calc Af Amer 77 (*) >90 mL/min   Comment: (NOTE)     The eGFR has been calculated using the CKD EPI equation.     This calculation has not been validated in all clinical situations.     eGFR's persistently <90 mL/min signify possible Chronic Kidney     Disease.   Anion gap 13  5 - 15  CBC     Status: Abnormal   Collection Time    10/31/13  5:00 AM      Result Value Ref Range   WBC 19.9 (*) 4.0 - 10.5 K/uL   RBC 3.66 (*) 4.22 - 5.81 MIL/uL   Hemoglobin 9.7 (*) 13.0 - 17.0 g/dL   HCT 31.2 (*) 39.0 - 52.0 %     MCV 85.2  78.0 - 100.0 fL   MCH 26.5  26.0 - 34.0 pg   MCHC 31.1  30.0 - 36.0 g/dL   RDW 20.2 (*) 11.5 - 15.5 %   Platelets 571 (*) 150 - 400 K/uL  GLUCOSE, CAPILLARY     Status: Abnormal   Collection Time    10/31/13  8:05 AM      Result Value Ref Range   Glucose-Capillary 130 (*) 70 - 99 mg/dL   Comment 1 Documented in Chart     Comment 2 Notify RN    GLUCOSE, CAPILLARY     Status: Abnormal   Collection Time    10/31/13 11:40 AM      Result Value Ref Range   Glucose-Capillary 138 (*) 70 - 99 mg/dL   Comment 1 Documented in Chart     Comment 2 Notify RN    GLUCOSE, CAPILLARY     Status: Abnormal   Collection Time    10/31/13  4:12 PM      Result Value Ref Range   Glucose-Capillary 59 (*) 70 - 99 mg/dL   Comment 1 Notify RN     Comment 2 Repeat Test     Comment 3 Documented in Chart    GLUCOSE, CAPILLARY     Status: Abnormal   Collection Time    10/31/13  4:55 PM      Result Value Ref Range   Glucose-Capillary 104 (*) 70 - 99 mg/dL   Comment 1 Documented in Chart     Comment 2 Repeat Test     Comment 3 Notify RN    GLUCOSE, CAPILLARY     Status: None   Collection Time    10/31/13  9:31 PM      Result Value Ref Range   Glucose-Capillary 84  70 - 99 mg/dL   Comment 1 Notify RN    BASIC METABOLIC PANEL     Status: Abnormal   Collection Time    11/01/13 12:30 AM      Result Value Ref Range   Sodium 136 (*) 137 - 147 mEq/L   Potassium 4.2  3.7 - 5.3 mEq/L   Chloride 98  96 - 112 mEq/L   CO2 25  19 - 32 mEq/L   Glucose, Bld 93  70 - 99 mg/dL   BUN 10  6 - 23 mg/dL   Creatinine, Ser 1.09  0.50 - 1.35 mg/dL   Calcium 8.1 (*) 8.4 - 10.5 mg/dL   GFR calc non Af Amer 67 (*) >90 mL/min   GFR calc Af Amer 78 (*) >90 mL/min   Comment: (NOTE)  The eGFR has been calculated using the CKD EPI equation.     This calculation has not been validated in all clinical situations.     eGFR's persistently <90 mL/min signify possible Chronic Kidney     Disease.   Anion gap 13  5  - 15  MAGNESIUM     Status: None   Collection Time    11/01/13  5:45 AM      Result Value Ref Range   Magnesium 2.2  1.5 - 2.5 mg/dL  CBC     Status: Abnormal   Collection Time    11/01/13  5:45 AM      Result Value Ref Range   WBC 17.6 (*) 4.0 - 10.5 K/uL   RBC 3.47 (*) 4.22 - 5.81 MIL/uL   Hemoglobin 9.1 (*) 13.0 - 17.0 g/dL   HCT 29.8 (*) 39.0 - 52.0 %   MCV 85.9  78.0 - 100.0 fL   MCH 26.2  26.0 - 34.0 pg   MCHC 30.5  30.0 - 36.0 g/dL   RDW 20.1 (*) 11.5 - 15.5 %   Platelets 572 (*) 150 - 400 K/uL  GLUCOSE, CAPILLARY     Status: Abnormal   Collection Time    11/01/13  8:03 AM      Result Value Ref Range   Glucose-Capillary 115 (*) 70 - 99 mg/dL   Comment 1 Documented in Chart     Comment 2 Notify RN    GLUCOSE, CAPILLARY     Status: Abnormal   Collection Time    11/01/13 12:00 PM      Result Value Ref Range   Glucose-Capillary 191 (*) 70 - 99 mg/dL   Comment 1 Documented in Chart     Comment 2 Notify RN    GLUCOSE, CAPILLARY     Status: None   Collection Time    11/01/13  5:16 PM      Result Value Ref Range   Glucose-Capillary 84  70 - 99 mg/dL   Comment 1 Notify RN     Comment 2 Documented in Chart      No results found.  Review of Systems  Constitutional: Positive for chills and malaise/fatigue.  HENT: Negative for ear discharge and ear pain.   Eyes: Negative for double vision and photophobia.  Respiratory: Positive for shortness of breath. Negative for cough.   Cardiovascular: Negative for chest pain and palpitations.  Gastrointestinal: Positive for nausea. Negative for blood in stool.  Genitourinary: Negative for frequency and hematuria.  Musculoskeletal: Positive for joint pain. Negative for myalgias.  Skin: Negative for rash.  Neurological: Negative for dizziness.  Endo/Heme/Allergies: Negative for polydipsia.  Psychiatric/Behavioral: Negative for suicidal ideas and substance abuse. The patient is nervous/anxious.    Blood pressure 122/68, pulse 89,  temperature 98 F (36.7 C), temperature source Oral, resp. rate 18, height _0  (1.753 m), weight 68.8 kg (151 lb 10.8 oz), SpO2 97.00%. Physical Exam  Nursing note and vitals reviewed. Constitutional: He is oriented to person, place, and time. He appears well-developed and well-nourished. No distress.  HENT:  Head: Normocephalic and atraumatic.  Nose: Nose normal.  Mouth/Throat: Oropharynx is clear and moist. No oropharyngeal exudate.  Eyes: Conjunctivae and EOM are normal. Pupils are equal, round, and reactive to light. No scleral icterus.  Neck: Normal range of motion. Neck supple. No JVD present. No tracheal deviation present.  Cardiovascular: Normal rate, regular rhythm, normal heart sounds and intact distal pulses.  Exam reveals no gallop.  No murmur heard. Respiratory: Effort normal. No respiratory distress. He has rales.  GI: Soft. Bowel sounds are normal. He exhibits no distension. There is no tenderness. There is no rebound.  Musculoskeletal:  Left aka wound packed/bandaged, not overtly weeping; right leg with no edema  Neurological: He is alert and oriented to person, place, and time. No cranial nerve deficit.  Left-sided weakness  Skin: Skin is warm and dry. He is not diaphoretic.  Psychiatric: He has a normal mood and affect. His behavior is normal. Thought content normal.   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 He has a known trigger (infection). CHADS2VASC of 6 (HTN, age > 64, vascular dsiease, T2DM, prior CVA). Would favor plan for rate control and initiation of heparin gtt for anticoagulation at low dose without bolus; however, Donald [name] is very concerned about his wound and potential bleeding. We discussed the risk of stroke at length between Donald [name], his family and me and at this time, he would prefer to defer anticoagulation. He will discuss further with his family. He is currently in NSR and he has not had rapid atrial flutter or atrial fibrillation. If he develops high rates, will metoprolol or diltiazem for rate control.  - continue telemetry - rate control with metoprolol 12.5 mg q6h PRN HR > 90 - heparin gtt pharmacy consult deferred given patient preference    Greta Yung 11/01/2013, 6:36 PM

## 2013-11-02 ENCOUNTER — Ambulatory Visit (HOSPITAL_COMMUNITY): Payer: Self-pay

## 2013-11-02 ENCOUNTER — Encounter: Payer: Self-pay | Admitting: Surgery

## 2013-11-02 ENCOUNTER — Encounter (HOSPITAL_COMMUNITY): Payer: Self-pay | Admitting: Vascular Surgery

## 2013-11-02 DIAGNOSIS — I480 Paroxysmal atrial fibrillation: Secondary | ICD-10-CM | POA: Diagnosis not present

## 2013-11-02 DIAGNOSIS — I4891 Unspecified atrial fibrillation: Secondary | ICD-10-CM

## 2013-11-02 DIAGNOSIS — I2510 Atherosclerotic heart disease of native coronary artery without angina pectoris: Secondary | ICD-10-CM | POA: Diagnosis present

## 2013-11-02 DIAGNOSIS — Z8673 Personal history of transient ischemic attack (TIA), and cerebral infarction without residual deficits: Secondary | ICD-10-CM

## 2013-11-02 LAB — WOUND CULTURE

## 2013-11-02 LAB — GLUCOSE, CAPILLARY
GLUCOSE-CAPILLARY: 135 mg/dL — AB (ref 70–99)
GLUCOSE-CAPILLARY: 127 mg/dL — AB (ref 70–99)
GLUCOSE-CAPILLARY: 79 mg/dL (ref 70–99)
Glucose-Capillary: 129 mg/dL — ABNORMAL HIGH (ref 70–99)
Glucose-Capillary: 63 mg/dL — ABNORMAL LOW (ref 70–99)

## 2013-11-02 MED ORDER — CIPROFLOXACIN HCL 500 MG PO TABS
500.0000 mg | ORAL_TABLET | Freq: Two times a day (BID) | ORAL | Status: DC
  Administered 2013-11-02 – 2013-11-05 (×6): 500 mg via ORAL
  Filled 2013-11-02 (×8): qty 1

## 2013-11-02 MED ORDER — METOPROLOL TARTRATE 25 MG PO TABS
25.0000 mg | ORAL_TABLET | Freq: Two times a day (BID) | ORAL | Status: DC
  Administered 2013-11-02: 25 mg via ORAL
  Filled 2013-11-02 (×2): qty 1

## 2013-11-02 MED ORDER — METOPROLOL TARTRATE 50 MG PO TABS
50.0000 mg | ORAL_TABLET | Freq: Two times a day (BID) | ORAL | Status: DC
  Administered 2013-11-02 – 2013-11-16 (×28): 50 mg via ORAL
  Filled 2013-11-02 (×30): qty 1

## 2013-11-02 MED ORDER — CIPROFLOXACIN HCL 500 MG PO TABS
500.0000 mg | ORAL_TABLET | Freq: Two times a day (BID) | ORAL | Status: DC
  Filled 2013-11-02 (×3): qty 1

## 2013-11-02 NOTE — Progress Notes (Signed)
Physical Therapy Wound Treatment Patient Details  Name: Donald [name] MRN: [id number] Date of Birth: 1945-08-27  Today's Date: 11/02/2013 Time: 3976-7341 Time Calculation (min): 54 min  Subjective  Subjective: Not sleeping well; lights, bells, noise Patient and Family Stated Goals: decr pain and heal wound Prior Treatments: "every kind of cream you can imagine"  Pain Score:   reported only with contact of ultrasonic mist head  Wound Assessment  Clinical Statement: Continued slow increase in amount of granulation "buds" spread throughout wound bases. Donald Liane Comber, RN, WOC in to assess wound and provided Medihoney dressing to use on distal buttock wound. Agreed to decr hydro to 3x/wk as medihoney should stay on >24 hours to be effective. (see orders)  Wound / Incision (Open or Dehisced) 10/13/13 Other (Comment) Buttocks Left;Distal MSDA (Active)  Dressing Type Other (Comment);ABD;Barrier Film (skin prep) Medihoney 11/02/2013 10:20 AM  Dressing Changed Changed 11/02/2013 10:20 AM  Dressing Status Clean;Dry;Intact 11/02/2013 10:20 AM  Dressing Change Frequency Monday, Wednesday, Friday 11/02/2013 10:20 AM  Site / Wound Assessment Painful;Granulation tissue;Pink;Yellow 11/02/2013 10:20 AM  % Wound base Red or Granulating 15% 11/02/2013 10:20 AM  % Wound base Yellow 85% 11/02/2013 10:20 AM  % Wound base Black 0% 11/02/2013 10:20 AM  % Wound base Other (Comment) 0% 11/02/2013 10:20 AM  Peri-wound Assessment Erythema (non-blanchable);Pink;Excoriated 11/02/2013 10:20 AM  Wound Length (cm) 8.2 cm 11/02/2013 10:20 AM  Wound Width (cm) 7.1 cm 11/02/2013 10:20 AM  Wound Depth (cm) 0.1 cm 11/02/2013 10:20 AM  Margins Unattached edges (unapproximated) 11/02/2013 10:20 AM  Closure None 11/02/2013 10:20 AM  Drainage Amount Other (Comment) 11/02/2013 10:20 AM  Drainage Description Serous 11/02/2013 10:20 AM  Non-staged Wound Description Partial thickness 11/02/2013 10:20 AM  Treatment Debridement (Selective);Hydrotherapy  (Ultrasonic mist) 11/02/2013 10:20 AM     Wound / Incision (Open or Dehisced) 10/13/13 Other (Comment) Buttocks Left;Proximal MSDA; includes 9 open areas proximal to large buttock wound (Active)  Dressing Type Gauze (Comment);Other (Comment);ABD;Barrier Film (skin prep);Moist to moist 11/02/2013 10:20 AM  Dressing Changed Changed 11/02/2013 10:20 AM  Dressing Status Clean;Dry;Intact 11/02/2013 10:20 AM  Dressing Change Frequency Daily 11/02/2013 10:20 AM  Site / Wound Assessment Painful;Pink;Yellow 11/02/2013 10:20 AM  % Wound base Red or Granulating 10% 11/02/2013 10:20 AM  % Wound base Yellow 90% 11/02/2013 10:20 AM  % Wound base Black 0% 11/02/2013 10:20 AM  % Wound base Other (Comment) 0% 11/02/2013 10:20 AM  Peri-wound Assessment Erythema (non-blanchable);Excoriated;Pink 11/02/2013 10:20 AM  Wound Length (cm) 6.3 cm 11/02/2013 10:20 AM  Wound Width (cm) 5.5 cm 11/02/2013 10:20 AM  Wound Depth (cm) 0.1 cm 11/02/2013 10:20 AM  Tunneling (cm) 0.2 10/13/2013  5:12 PM  Margins Unattached edges (unapproximated) 11/02/2013 10:20 AM  Closure None 11/02/2013 10:20 AM  Drainage Amount Minimal 11/02/2013 10:20 AM  Drainage Description Serous 11/02/2013 10:20 AM  Non-staged Wound Description Partial thickness 11/02/2013 10:20 AM  Treatment Debridement (Selective);Hydrotherapy (Ultrasonic mist) 11/02/2013 10:20 AM      Hydrotherapy Ultrasonic mist  - wound location: Lt buttock  Ultrasonic mist at 35KHz (+/-3KHz) at ___ percent: 100 % (had to decrease to 50% for contact) Ultrasonic mist therapy minutes: 8 min (4 min non-contact; 4 minutes contact) Selective Debridement Selective Debridement - Location: Lt buttock wounds Selective Debridement - Tools Used: Other (comment);Forceps;Scalpel (tip of ultrasound wand) Selective Debridement - Tissue Removed: yellow slough (and scored to increase surface area)   Wound Assessment and Plan  Wound Therapy - Assess/Plan/Recommendations Wound Therapy - Clinical Statement: Continued slow  increase in amount of granulation "buds" spread throughout wound bases. Donald Liane Comber, RN, WOC in to assess wound and provided Medihoney dressing to use on distal buttock wound. Agreed to decr hydro to 3x/wk as medihoney should stay on >24 hours to be effective. (see orders) Wound Therapy - Functional Problem List: limiting all mobility per PT & OT due to pain Factors Delaying/Impairing Wound Healing: Diabetes Mellitus;Immobility;Multiple medical problems Hydrotherapy Plan: Debridement;Dressing change;Patient/family education;Ultrasonic wound therapy $RemoveBefor'@35'[id number]$  KHz (+/- 3 KHz) Wound Therapy - Frequency: 3X / week Wound Therapy - Current Recommendations: WOC nurse Wound Therapy - Follow Up Recommendations: Skilled nursing facility Wound Plan: see above  Wound Therapy Goals- Improve the function of patient's integumentary system by progressing the wound(s) through the phases of wound healing (inflammation - proliferation - remodeling) by: Decrease Necrotic Tissue to: 70 Decrease Necrotic Tissue - Progress: Goal set today Increase Granulation Tissue to: 30 Increase Granulation Tissue - Progress: Goal set today Goals/treatment plan/discharge plan were made with and agreed upon by patient/family: Yes Time For Goal Achievement: 7 days Wound Therapy - Potential for Goals: Good  Goals will be updated until maximal potential achieved or discharge criteria met.  Discharge criteria: when goals achieved, discharge from hospital, MD decision/surgical intervention, no progress towards goals, refusal/missing three consecutive treatments without notification or medical reason.  GP     Donald [name] 11/02/2013, 10:38 AM Pager [phone]

## 2013-11-02 NOTE — Progress Notes (Signed)
[facility] Progress Note  11/02/2013 7:32 AM POD 34/4  Subjective:  C/o not being able to sleep  Afebrile 557'D-220'U systolic HR 54'Y-70'W  Filed Vitals:   11/02/13 0413  BP: 155/61  Pulse: 93  Temp: 98.3 F (36.8 C)  Resp: 17    Physical Exam: Incisions:  Wound with small area necrotic tissue at the base--there is healthy appearing tissue within the rest of the wound   CBC    Component Value Date/Time   WBC 17.6* 11/01/2013 0545   WBC 7.1 06/05/2013 1010   RBC 3.47* 11/01/2013 0545   RBC 4.23 06/05/2013 1010   RBC 3.54* 01/26/2012 0455   HGB 9.1* 11/01/2013 0545   HGB 9.3* 06/05/2013 1010   HCT 29.8* 11/01/2013 0545   HCT 30.8* 06/05/2013 1010   PLT 572* 11/01/2013 0545   PLT 324 06/05/2013 1010   MCV 85.9 11/01/2013 0545   MCV 72.9* 06/05/2013 1010   MCH 26.2 11/01/2013 0545   MCH 22.0* 06/05/2013 1010   MCHC 30.5 11/01/2013 0545   MCHC 30.2* 06/05/2013 1010   RDW 20.1* 11/01/2013 0545   RDW 16.9* 06/05/2013 1010   LYMPHSABS 1.0 10/28/2013 0500   LYMPHSABS 1.6 06/05/2013 1010   MONOABS 1.2* 10/28/2013 0500   MONOABS 0.5 06/05/2013 1010   EOSABS 0.1 10/28/2013 0500   EOSABS 0.1 06/05/2013 1010   BASOSABS 0.0 10/28/2013 0500   BASOSABS 0.1 06/05/2013 1010    BMET    Component Value Date/Time   NA 136* 11/01/2013 0030   NA 137 06/05/2013 1010   NA 147* 10/12/2011 0959   K 4.2 11/01/2013 0030   K 5.4* 06/05/2013 1010   K 5.2* 10/12/2011 0959   CL 98 11/01/2013 0030   CL 102 05/19/2012 1427   CL 102 10/12/2011 0959   CO2 25 11/01/2013 0030   CO2 23 06/05/2013 1010   CO2 29 10/12/2011 0959   GLUCOSE 93 11/01/2013 0030   GLUCOSE 239* 06/05/2013 1010   GLUCOSE 205* 05/19/2012 1427   GLUCOSE 244* 10/12/2011 0959   BUN 10 11/01/2013 0030   BUN 19.5 06/05/2013 1010   BUN 13 10/12/2011 0959   CREATININE 1.09 11/01/2013 0030   CREATININE 1.4* 06/05/2013 1010   CREATININE 1.0 10/12/2011 0959   CALCIUM 8.1* 11/01/2013 0030   CALCIUM 10.1 06/05/2013 1010   CALCIUM 9.0 10/12/2011 0959   GFRNONAA 67* 11/01/2013 0030   GFRAA 78* 11/01/2013 0030    INR    Component Value Date/Time   INR 1.06 10/07/2013 1748     Intake/Output Summary (Last 24 hours) at 11/02/13 0732 Last data filed at 11/02/13 0500  Gross per 24 hour  Intake   2030 ml  Output   1150 ml  Net    880 ml     Assessment/Plan:  68 y.o. male is s/p left above knee amputation  POD 34/6  -pt wound does have some necrotic tissue within the wound bed, however, the rest of the wound does have healthy appearing tissue. -cultures grew out E coli and Enterococcus and are sensitive to Cipro.  Cipro started 500 mg po bid. -will hold on consult with Dr. Sharol Given at this time. -appreciate cards consult-heparin gtt recommended, however, pt prefers to defer this.  He is currently in sinus rhythm.  If he develops high rate, lopressor or diltiazem for rate control.    Leontine Locket, PA-C [facility] [phone] 11/02/2013 7:32 AM  I agree wit the above.  Dressing changed.  There is some necrotic tissue on the posterior flap.  Will continue with tid dsg changes, to see how this cleans up.  May need sharp debridement at bedside.  Muscle looks ok, but still at risk for hip disarticulations  Annamarie Major

## 2013-11-02 NOTE — Progress Notes (Signed)
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    Nurse who received alert:  Noreene Larsson  MD notified (1st page):  Dr. Donnetta Hutching   Time of first page:  0820  MD notified (2nd page):  Time of second page:  Responding MD:  Dr. Donnetta Hutching  Time MD responded:  [phone]

## 2013-11-02 NOTE — Progress Notes (Signed)
Cardiology rounded on pt and viewed EKG and rhythm strips at 1015.

## 2013-11-02 NOTE — Progress Notes (Signed)
Subjective:  No chest pain or SOB  Scheduled Meds: . ciprofloxacin  500 mg Oral Q12H  . collagenase   Topical Daily  . dipyridamole-aspirin  1 capsule Oral BID  . DULoxetine  30 mg Oral Daily  . enoxaparin (LOVENOX) injection  40 mg Subcutaneous Q24H  . feeding supplement (ENSURE COMPLETE)  237 mL Oral TID BM  . ferrous fumarate  1 tablet Oral BID  . ferrous sulfate  325 mg Oral BID WC  . gabapentin  300 mg Oral TID  . insulin aspart  0-5 Units Subcutaneous QHS  . insulin aspart  0-9 Units Subcutaneous TID WC  . insulin detemir  10 Units Subcutaneous Daily  . lisinopril  2.5 mg Oral Daily  . metFORMIN  500 mg Oral BID WC  . metoprolol tartrate  25 mg Oral BID  . multivitamin with minerals  1 tablet Oral Daily  . omega-3 acid ethyl esters  2 g Oral BID  . OxyCODONE  40 mg Oral Q12H  . pantoprazole  40 mg Oral Daily  . saccharomyces boulardii  250 mg Oral BID  . vitamin C  500 mg Oral Daily   Continuous Infusions: . sodium chloride 50 mL/hr at 11/01/13 1900   PRN Meds:.alum & mag hydroxide-simeth, guaiFENesin-dextromethorphan, hydrALAZINE, labetalol, magnesium sulfate 1 - 4 g bolus IVPB, metoprolol, metoprolol tartrate, morphine injection, ondansetron, oxyCODONE-acetaminophen, phenol, potassium chloride, traZODone  Objective:  Vital Signs in the last 24 hours: Temp:  [97.5 F (36.4 C)-98.9 F (37.2 C)] 97.5 F (36.4 C) (07/06 0800) Pulse Rate:  [89-95] 92 (07/06 0830) Resp:  [13-19] 13 (07/06 0830) BP: (112-171)/(56-80) 143/56 mmHg (07/06 0830) SpO2:  [95 %-99 %] 97 % (07/06 0830) Weight:  [154 lb 5.2 oz (70 kg)] 154 lb 5.2 oz (70 kg) (07/06 0413)  Intake/Output from previous day:  Intake/Output Summary (Last 24 hours) at 11/02/13 1014 Last data filed at 11/02/13 0900  Gross per 24 hour  Intake   2030 ml  Output   1150 ml  Net    880 ml    Physical Exam: General appearance: alert, cooperative and no distress Lungs: scattered wheezing rhonchi Rt base Heart:  regular rate and rhythm   Rate: 88  Rhythm: normal sinus rhythm and premature atrial contractions (PAC)  Lab Results:  Recent Labs  10/31/13 0500 11/01/13 0545  WBC 19.9* 17.6*  HGB 9.7* 9.1*  PLT 571* 572*    Recent Labs  10/31/13 0500 11/01/13 0030  NA 134* 136*  K 4.1 4.2  CL 96 98  CO2 25 25  GLUCOSE 87 93  BUN 10 10  CREATININE 1.10 1.09   No results found for this basename: TROPONINI, CK, MB,  in the last 72 hours No results found for this basename: INR,  in the last 72 hours  Imaging: Imaging results have been reviewed  Cardiac Studies:  Assessment/Plan:  68 yo male with a history of PVD, 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 Transferred from [place] with no healing stump, s/p debridement 10/29/13- he may require further surgery. Cardiology consulted 11/01/13 for PAF (new). Pt converted back to NSR spontaneously. He was unaware of AF. He declined IV Heparin anticoagulation, worried about open stump wound.    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 He has remained in NSR with occasional PACs since yesterday. Will document TSH. He is on Aggrenox- will not add ASA. Start a scheduled beta blocker dose. Check echo.  Kerin Ransom PA-C Beeper [phone] 11/02/2013, 10:14 AM   The patient was seen, examined and discussed with Kerin Ransom, PA-C and I agree with the above.   Paroxysmal a-fib post AKA with spontaneous cardioversion to NSR. No more episodes in the last 24 hours. There are infrequent PVCs but no VTs on telemetry (only artifacts). We would recommend to increase  metoprolol to 50 mg po bid.   Dorothy Spark 11/02/2013

## 2013-11-02 NOTE — Progress Notes (Signed)
      Spoke with Cardiology they are will round on the patient and address rate control.  RN reported V-tach at 8:44 am.  Laurence Slate [facility] 11/02/2013 9:28 AM

## 2013-11-02 NOTE — Progress Notes (Signed)
  Echocardiogram 2D Echocardiogram has been performed.  [name] 11/02/2013, 1:34 PM

## 2013-11-02 NOTE — Progress Notes (Addendum)
Pt went into V-tach for about 45 seconds and converted back to NS. Notified Dr. Donnetta Hutching. EKG done. No new orders received.

## 2013-11-03 ENCOUNTER — Ambulatory Visit (HOSPITAL_COMMUNITY): Payer: Self-pay

## 2013-11-03 DIAGNOSIS — I70229 Atherosclerosis of native arteries of extremities with rest pain, unspecified extremity: Secondary | ICD-10-CM

## 2013-11-03 DIAGNOSIS — L98499 Non-pressure chronic ulcer of skin of other sites with unspecified severity: Secondary | ICD-10-CM

## 2013-11-03 DIAGNOSIS — I739 Peripheral vascular disease, unspecified: Secondary | ICD-10-CM

## 2013-11-03 DIAGNOSIS — Z8673 Personal history of transient ischemic attack (TIA), and cerebral infarction without residual deficits: Secondary | ICD-10-CM

## 2013-11-03 LAB — TSH: TSH: 6.42 u[IU]/mL — ABNORMAL HIGH (ref 0.350–4.500)

## 2013-11-03 LAB — ANAEROBIC CULTURE

## 2013-11-03 LAB — GLUCOSE, CAPILLARY
GLUCOSE-CAPILLARY: 96 mg/dL (ref 70–99)
GLUCOSE-CAPILLARY: 96 mg/dL (ref 70–99)
GLUCOSE-CAPILLARY: 97 mg/dL (ref 70–99)
GLUCOSE-CAPILLARY: 98 mg/dL (ref 70–99)
Glucose-Capillary: 92 mg/dL (ref 70–99)
Glucose-Capillary: 74 mg/dL (ref 70–99)

## 2013-11-03 NOTE — Progress Notes (Signed)
    Subjective   Feels ok today   Physical Exam:    Dressing changed.  There is some necrotic tissue on the posterior flap.  Will continue with tid dsg changes, to see how this cleans up.  May need sharp debridement at bedside.  Muscle looks ok, but still at risk for hip disarticulations  Donald [name]        Filed Vitals:   11/03/13 1609  BP: 123/49  Pulse: 74  Temp: 98.3 F (36.8 C)  Resp:     Intake/Output Summary (Last 24 hours) at 11/03/13 1729 Last data filed at 11/03/13 1600  Gross per 24 hour  Intake   1170 ml  Output   1452 ml  Net   -282 ml     Laboratory CBC    Component Value Date/Time   WBC 17.6* 11/01/2013 0545   WBC 7.1 06/05/2013 1010   HGB 9.1* 11/01/2013 0545   HGB 9.3* 06/05/2013 1010   HCT 29.8* 11/01/2013 0545   HCT 30.8* 06/05/2013 1010   PLT 572* 11/01/2013 0545   PLT 324 06/05/2013 1010    BMET    Component Value Date/Time   NA 136* 11/01/2013 0030   NA 137 06/05/2013 1010   NA 147* 10/12/2011 0959   K 4.2 11/01/2013 0030   K 5.4* 06/05/2013 1010   K 5.2* 10/12/2011 0959   CL 98 11/01/2013 0030   CL 102 05/19/2012 1427   CL 102 10/12/2011 0959   CO2 25 11/01/2013 0030   CO2 23 06/05/2013 1010   CO2 29 10/12/2011 0959   GLUCOSE 93 11/01/2013 0030   GLUCOSE 239* 06/05/2013 1010   GLUCOSE 205* 05/19/2012 1427   GLUCOSE 244* 10/12/2011 0959   BUN 10 11/01/2013 0030   BUN 19.5 06/05/2013 1010   BUN 13 10/12/2011 0959   CREATININE 1.09 11/01/2013 0030   CREATININE 1.4* 06/05/2013 1010   CREATININE 1.0 10/12/2011 0959   CALCIUM 8.1* 11/01/2013 0030   CALCIUM 10.1 06/05/2013 1010   CALCIUM 9.0 10/12/2011 0959   GFRNONAA 67* 11/01/2013 0030   GFRAA 78* 11/01/2013 0030    COAG Lab Results  Component Value Date   INR 1.06 10/07/2013   INR 1.14 09/05/2013   INR 1.01 09/04/2013   No results found for this basename: PTT    Antibiotics Anti-infectives   Start     Dose/Rate Route Frequency Ordered Stop   11/02/13 1200  ciprofloxacin (CIPRO) tablet 500 mg     500 mg Oral Every  12 hours 11/02/13 0939     11/02/13 0800  ciprofloxacin (CIPRO) tablet 500 mg  Status:  Discontinued     500 mg Oral 2 times daily 11/02/13 0734 11/02/13 0939   10/29/13 2200  piperacillin-tazobactam (ZOSYN) IVPB 3.375 g  Status:  Discontinued     3.375 g 12.5 mL/hr over 240 Minutes Intravenous Every 8 hours 10/29/13 1507 11/02/13 0734   10/29/13 1630  vancomycin (VANCOCIN) IVPB 1000 mg/200 mL premix  Status:  Discontinued     1,000 mg 200 mL/hr over 60 Minutes Intravenous Every 12 hours 10/29/13 1512 11/02/13 0734       V. Leia Alf, M.D. [facility] [place] Office: [phone] Pager:  [phone]

## 2013-11-03 NOTE — Progress Notes (Signed)
OT Cancellation Note  Patient Details Name: Donald [name] MRN: [id number] DOB: 10/28/1945   Cancelled Treatment:    Reason Eval/Treat Not Completed: Other (comment) Pt being cleaned up by tech due to problems with flexiseal and catheter. Will return this pm if able. [place], OTR/L  [phone] 11/03/2013 11/03/2013, 9:40 AM

## 2013-11-03 NOTE — Progress Notes (Signed)
    Subjective:  No cardiac complaints  Objective:  Vital Signs in the last 24 hours: Temp:  [97.4 F (36.3 C)-99 F (37.2 C)] 98.3 F (36.8 C) (07/07 0840) Pulse Rate:  [70-91] 83 (07/07 0840) Resp:  [11-18] 16 (07/07 0840) BP: (143-169)/(59-66) 167/59 mmHg (07/07 0840) SpO2:  [91 %-98 %] 94 % (07/07 0840)  Intake/Output from previous day:  Intake/Output Summary (Last 24 hours) at 11/03/13 1223 Last data filed at 11/03/13 0800  Gross per 24 hour  Intake    900 ml  Output   1202 ml  Net   -302 ml    Physical Exam: General appearance: alert, cooperative and no distress Lungs: clear to auscultation bilaterally Heart: regular rate and rhythm   Rate: 60-80  Rhythm: normal sinus rhythm  Lab Results:  Recent Labs  11/01/13 0545  WBC 17.6*  HGB 9.1*  PLT 572*    Recent Labs  11/01/13 0030  NA 136*  K 4.2  CL 98  CO2 25  GLUCOSE 93  BUN 10  CREATININE 1.09   No results found for this basename: TROPONINI, CK, MB,  in the last 72 hours No results found for this basename: INR,  in the last 72 hours  Imaging: Imaging results have been reviewed  Cardiac Studies: Echo 11/02/13 Ln LVF and LA size  Assessment/Plan:  68 yo male with a history of PVD, 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 Transferred from [place] 10/29/13 with non healing stump, s/p debridement 10/29/13- he may require further surgery. Cardiology consulted 11/01/13 for PAF (new). Pt converted back to NSR spontaneously. Beta blocker added. He was unaware of AF. He declined IV Heparin anticoagulation, worried about open stump wound.   Principal Problem:   PAF (paroxysmal atrial fibrillation) Active Problems:   Non-healing wound of amputation stump   Peripheral vascular disease   Diabetes mellitus with circulatory complication   S/P AKA 0/10/93   CAD- CTO RCA Sept 2013- medical Rx   HYPERLIPIDEMIA   Anal cancer   Hemiparesis affecting left side as late effect of stroke   HTN (hypertension)   History of CVA -  2008    PLAN: Echo normal, holding NSR-on Lopressor. The pt has declined anticoagulation. He is on Aggrenox from a prior stroke. We will see again as needed, he may go back into AF under stress in the future. We will arrange for OP follow up.   Kerin Ransom PA-C Beeper [phone] 11/03/2013, 12:23 PM   I have seen and examined the patient along with Kerin Ransom PA-C.  I have reviewed the chart, notes and new data.  I agree with PA/NP's note.  Key new complaints: no cardiac symptoms Key examination changes: RRR, wide split S2 Key new findings / data: NSR on monitor  PLAN: Refuses full anticoagulation - "everyone I know who took it is dead" even after discussion re: high risk of recurrent stroke. Continue on beta blocker for rate control. Aggrenox will provide some embolism protection. Please re-consult if needed.  Sanda Klein, MD, [place] [phone] 11/03/2013, 12:42 PM

## 2013-11-03 NOTE — Discharge Instructions (Signed)
Atrial Fibrillation  Atrial fibrillation is a type of irregular heart rhythm (arrhythmia). During atrial fibrillation, the upper chambers of the heart (atria) quiver continuously in a chaotic pattern. This causes an irregular and often rapid heart rate.   Atrial fibrillation is the result of the heart becoming overloaded with disorganized signals that tell it to beat. These signals are normally released one at a time by a part of the right atrium called the sinoatrial node. They then travel from the atria to the lower chambers of the heart (ventricles), causing the atria and ventricles to contract and pump blood as they pass. In atrial fibrillation, parts of the atria outside of the sinoatrial node also release these signals. This results in two problems. First, the atria receive so many signals that they do not have time to fully contract. Second, the ventricles, which can only receive one signal at a time, beat irregularly and out of rhythm with the atria.   There are three types of atrial fibrillation:   · Paroxysmal. Paroxysmal atrial fibrillation starts suddenly and stops on its own within a week.  · Persistent. Persistent atrial fibrillation lasts for more than a week. It may stop on its own or with treatment.  · Permanent. Permanent atrial fibrillation does not go away. Episodes of atrial fibrillation may lead to permanent atrial fibrillation.  Atrial fibrillation can prevent your heart from pumping blood normally. It increases your risk of stroke and can lead to heart failure.   CAUSES   · Heart conditions, including a heart attack, heart failure, coronary artery disease, and heart valve conditions.    · Inflammation of the sac that surrounds the heart (pericarditis).  · Blockage of an artery in the lungs (pulmonary embolism).  · Pneumonia or other infections.  · Chronic lung disease.  · Thyroid problems, especially if the thyroid is overactive (hyperthyroidism).  · Caffeine, excessive alcohol use, and use  of some illegal drugs.    · Use of some medicines, including certain decongestants and diet pills.  · Heart surgery.    · Birth defects.    Sometimes, no cause can be found. When this happens, the atrial fibrillation is called lone atrial fibrillation. The risk of complications from atrial fibrillation increases if you have lone atrial fibrillation and you are age 60 years or older.  RISK FACTORS  · Heart failure.  · Coronary artery disease.  · Diabetes mellitus.    · High blood pressure (hypertension).    · Obesity.    · Other arrhythmias.    · Increased age.  SYMPTOMS   · A feeling that your heart is beating rapidly or irregularly.    · A feeling of discomfort or pain in your chest.    · Shortness of breath.    · Sudden light-headedness or weakness.    · Getting tired easily when exercising.    · Urinating more often than normal (mainly when atrial fibrillation first begins).    In paroxysmal atrial fibrillation, symptoms may start and suddenly stop.  DIAGNOSIS   Your health care provider may be able to detect atrial fibrillation when taking your pulse. Your health care provider may have you take a test called an ambulatory electrocardiogram (ECG). An ECG records your heartbeat patterns over a 24-hour period. You may also have other tests, such as:  · Transthoracic echocardiogram (TTE). During echocardiography, sound waves are used to evaluate how blood flows through your heart.  · Transesophageal echocardiogram (TEE).  · Stress test. There is more than one type of stress test. If a stress test is   needed, ask your health care provider about which type is best for you.    · Chest X-ray exam.  · Blood tests.  · Computed tomography (CT).  TREATMENT   Treatment may include:  · Treating any underlying conditions. For example, if you have an overactive thyroid, treating the condition may correct atrial fibrillation.  · Taking medicine. Medicines may be given to control a rapid heart rate or to prevent blood clots, heart  failure, or a stroke.  · Having a procedure to correct the rhythm of the heart:  ¨ Electrical cardioversion. During electrical cardioversion, a controlled, low-energy shock is delivered to the heart through your skin. If you have chest pain, very low blood pressure, or sudden heart failure, this procedure may need to be done as an emergency.  ¨ Catheter ablation. During this procedure, heart tissues that send the signals that cause atrial fibrillation are destroyed.  ¨ Maze or minimaze procedure. During this surgery, thin lines of heart tissue that carry the abnormal signals are destroyed. The maze procedure is an open-heart surgery. The minimaze procedure is a minimally invasive surgery. This means that small cuts are made to access the heart instead of a large opening.  ¨ Pulmonary venous isolation. During this surgery, tissue around the veins that carry blood from the lungs (pulmonary veins) is destroyed. This tissue is thought to carry the abnormal signals.  HOME CARE INSTRUCTIONS   · Only take medicines that your health care provider approves, and take them as directed. Some medicines can make atrial fibrillation worse or recur.  · If blood thinners were prescribed by your health care provider, take them exactly as directed. Too much blood-thinning medicine can cause bleeding. If you take too little, you will not have the needed protection against stroke and other problems.  · Perform blood tests at home if directed by your health care provider. Perform blood tests exactly as directed.  · Quit smoking if you smoke.  · Do not drink alcohol.  · Do not drink caffeinated beverages such as coffee, soda, and some teas. You may drink decaffeinated coffee, soda, or tea.    · Maintain a healthy weight. Do not use diet pills unless your health care provider approves. They may make heart problems worse.    · Follow diet instructions as directed by your health care provider.  · Exercise regularly as directed by your health  care provider.  · Keep all follow-up appointments with your health care provider.  PREVENTION   The following substances can cause atrial fibrillation to recur:   · Caffeinated beverages.  · Alcohol.  · Certain medicines, especially those used for breathing problems.  · Certain herbs and herbal medicines, such as those containing ephedra or ginseng.  · Illegal drugs, such as cocaine and amphetamines.  Sometimes medicines are given to prevent atrial fibrillation from recurring. Proper treatment of any underlying condition is also important in helping prevent recurrence.   SEEK MEDICAL CARE IF:  · You notice a change in the rate, rhythm, or strength of your heartbeat.  · You suddenly begin urinating more frequently.  · You tire more easily when exerting yourself or exercising.  SEEK IMMEDIATE MEDICAL CARE IF:   · You have chest pain, abdominal pain, sweating, or weakness.  · You feel nauseous.  · You have shortness of breath.  · You suddenly have swollen feet and ankles.  · You feel dizzy.  · Your face or limbs feel numb or weak.  · You have a change

## 2013-11-03 NOTE — Clinical Documentation Improvement (Signed)
PLEASE NOTE IF PRESENT ON ADMISSION  Sacral Pressure Ulcer noted in 7/4, 7/5 Progress Notes.  Please clarify the stage if known and document findings in next Progress Note and Discharge Summary.   Stage  I  Pressure Ulcer   (reddening of the skin) Stage  II Pressure Ulcer  (blister open or unopened) Stage  III Pressure Ulcer (through all layers skin) Stage IV Pressure Ulcer   (through skin & underlying  muscle, tendons, and bones) Other Condition   Thank You, Zoila Shutter ,RN Clinical Documentation Specialist:  [place] Information Management

## 2013-11-03 NOTE — Clinical Documentation Improvement (Signed)
Patient was seen by Nutritional Consults.    Severe Malnutrition documented in context of chronic illness.  BMI is 22  Muscle depletion in patella, thigh and calf regions  Has lost 14 pounds in a 2 week period  Recommendations include Ensure Complete Milkshakes which the patient enjoys  Please clarify if you agree with consult and render your opinion in next Progress Note, Discharge Summary and add to Problem List with the Severity included.   Severe Malnutrition   Severe Protein Calorie Malnutrition Moderate Malnutrition Mild Malnutrition Other Condition  Thank You, Zoila Shutter ,RN Clinical Documentation Specialist:  [place] Information Management

## 2013-11-03 NOTE — Progress Notes (Signed)
Physical Therapy Treatment Patient Details Name: Donald [name] MRN: [id number] DOB: 11-Jun-1945 Today's Date: 11/03/2013    History of Present Illness 68 yo male s/p Lt aka readmission for CIR for I&D and new sacral wound. HX May 2015 for Lt femoral-popliteal bypass. pt has hx of Lt side affect CVA, Lt aka 10/09/13 rectal CA (2009) with metastases in liver(2012) , HTN, gout, PVD, Ferd, CAD, and DM2     PT Comments    Session focused on PROM and AAROM activities for hip musculature as well as there-ex and dynamic balance control activities for core in seated position EOB. Patient in significant pain with increased anxiety throughout session.    Follow Up Recommendations  SNF;Supervision/Assistance - 24 hour     Equipment Recommendations  Other (comment) (TBA)    Recommendations for Other Services Rehab consult     Precautions / Restrictions Precautions Precautions: Fall Precaution Comments: LT AKA wrap flexiseal foley Restrictions Weight Bearing Restrictions: No    Mobility  Bed Mobility Overal bed mobility: Needs Assistance Bed Mobility: Supine to Sit;Sit to Supine;Rolling Rolling: Mod assist (to left, min assist to right)   Supine to sit: Mod assist;HOB elevated Sit to supine: Min assist   General bed mobility comments: Pt required (A) to shift weight to the left and push up iwth Right UE, assist to roll, heavy reliance on R right to reposition. Increased pain with bed mobility  Transfers                    Ambulation/Gait                 Stairs            Wheelchair Mobility    Modified Rankin (Stroke Patients Only)       Balance     Sitting balance-Leahy Scale: Fair Sitting balance - Comments: sat EOB with assist for multiple activities approx 21 minutes (see ther ex)                            Cognition Arousal/Alertness: Awake/alert Behavior During Therapy: Anxious Overall Cognitive Status: Within Functional  Limits for tasks assessed                      Exercises Amputee Exercises Hip Extension: PROM;Left;10 reps;Supine Hip ABduction/ADduction: PROM;AAROM;Left;10 reps;Supine Hip Flexion/Marching: AAROM;Left;10 reps;Supine Other Exercises Other Exercises: dynamic weight shifts onto left hip seated EOB with assist (10 second holds) Other Exercises: Trunk flexion and extension dynamic sitting with assist Other Exercises: RLE long arc quads with support and weight shift onto left hip in sitting Other Exercises: Reaching overhead and in front for dynamic balance and weight shifting    General Comments        Pertinent Vitals/Pain "10/10" nauseated with increased pain    Home Living                      Prior Function            PT Goals (current goals can now be found in the care plan section) Acute Rehab PT Goals Patient Stated Goal: to get back  home to cats PT Goal Formulation: With patient Time For Goal Achievement: 11/13/13 Potential to Achieve Goals: Fair Progress towards PT goals: Progressing toward goals    Frequency  Min 3X/week    PT Plan Current plan remains appropriate    Co-evaluation  End of Session Equipment Utilized During Treatment: Oxygen Activity Tolerance: Patient limited by fatigue;Patient limited by pain Patient left: in bed;with call bell/phone within reach;with family/visitor present     Time: 1534-1600 PT Time Calculation (min): 26 min  Charges:  $Therapeutic Exercise: 8-22 mins $Therapeutic Activity: 8-22 mins                    G CodesDuncan Dull 11-15-2013, 4:11 PM Alben Deeds, [place] DPT  [phone]

## 2013-11-04 ENCOUNTER — Ambulatory Visit (HOSPITAL_COMMUNITY): Payer: Self-pay

## 2013-11-04 LAB — GLUCOSE, CAPILLARY
GLUCOSE-CAPILLARY: 95 mg/dL (ref 70–99)
GLUCOSE-CAPILLARY: 105 mg/dL — AB (ref 70–99)
Glucose-Capillary: 91 mg/dL (ref 70–99)
Glucose-Capillary: 80 mg/dL (ref 70–99)
Glucose-Capillary: 92 mg/dL (ref 70–99)
Glucose-Capillary: 128 mg/dL — ABNORMAL HIGH (ref 70–99)

## 2013-11-04 MED ORDER — ALPRAZOLAM 0.25 MG PO TABS
0.2500 mg | ORAL_TABLET | Freq: Three times a day (TID) | ORAL | Status: DC | PRN
Start: 2013-11-04 — End: 2013-11-05
  Administered 2013-11-04 (×2): 0.25 mg via ORAL
  Filled 2013-11-04 (×2): qty 1

## 2013-11-04 NOTE — Clinical Social Work Placement (Addendum)
Clinical Social Work Department CLINICAL SOCIAL WORK PLACEMENT NOTE 11/04/2013  Patient:  CLYDE, ZARRELLA  Account Number:  [id number] [place] date:  12/08/2013  Clinical Social Worker:  Lovey Newcomer  Date/time:  11/04/2013 10:51 AM  Clinical Social Work is seeking post-discharge placement for this patient at the following level of care:   SKILLED NURSING   (*CSW will update this form in Epic as items are completed)   11/04/2013  Patient/family provided with [place] Department of Clinical Social Work's list of facilities offering this level of care within the geographic area requested by the patient (or if unable, by the patient's family).  11/04/2013  Patient/family informed of their freedom to choose among providers that offer the needed level of care, that participate in Medicare, Medicaid or managed care program needed by the patient, have an available bed and are willing to accept the patient.  11/04/2013  Patient/family informed of [facility]' ownership interest in [facility], as well as of the fact that they are under no obligation to receive care at this facility.  PASARR submitted to EDS on  PASARR number received on   FL2 transmitted to all facilities in geographic area requested by pt/family on  11/04/2013 FL2 transmitted to all facilities within larger geographic area on   Patient informed that his/her managed care company has contracts with or will negotiate with  certain facilities, including the following:     Patient/family informed of bed offers received:  --- Patient chooses bed at  Physician recommends and patient chooses bed at    Patient to be transferred to [facility] on  11/16/2013 Patient to be transferred to facility by [facility] Patient and family notified of transfer on 11/16/2013 Name of family member notified:  Laneta Simmers (Spouse)  The following physician request were entered in Epic:   Additional  Comments:    Liz Beach MSW, [place], [place], [phone]

## 2013-11-04 NOTE — Progress Notes (Signed)
Physical Therapy Wound Treatment Patient Details  Name: Donald [name] MRN: [id number] Date of Birth: 1945-12-06  Today's Date: 11/04/2013 Time: 3818-2993 Time Calculation (min): 51 min  Subjective  Subjective: "I hardly slept at all last night."   Patient and Family Stated Goals: decr pain and heal wound Date of Onset:  (unknown) Prior Treatments: "every kind of cream you can imagine"  Pain Score: Pain Score: 3   Wound Assessment  Negative Pressure Wound Therapy Leg Left;Anterior (Active)     Wound / Incision (Open or Dehisced) 10/13/13 Other (Comment) Buttocks Left;Distal MSDA (Active)  Dressing Type Other (Comment);ABD;Barrier Film (skin prep) 11/04/2013 11:00 AM  Dressing Changed Changed 11/04/2013 11:00 AM  Dressing Status Clean;Dry;Intact 11/04/2013 11:00 AM  Dressing Change Frequency Monday, Wednesday, Friday 11/04/2013 11:00 AM  Site / Wound Assessment Painful;Granulation tissue;Pink;Yellow 11/04/2013 11:00 AM  % Wound base Red or Granulating 15% 11/04/2013 11:00 AM  % Wound base Yellow 85% 11/04/2013 11:00 AM  % Wound base Black 0% 11/04/2013 11:00 AM  % Wound base Other (Comment) 0% 11/04/2013 11:00 AM  Peri-wound Assessment Erythema (non-blanchable);Pink;Excoriated 11/04/2013 11:00 AM  Wound Length (cm) 8.2 cm 11/02/2013 10:20 AM  Wound Width (cm) 7.1 cm 11/02/2013 10:20 AM  Wound Depth (cm) 0.1 cm 11/02/2013 10:20 AM  Margins Unattached edges (unapproximated) 11/04/2013 11:00 AM  Closure None 11/04/2013 11:00 AM  Drainage Amount Other (Comment) 11/04/2013 11:00 AM  Drainage Description Serous 11/04/2013 11:00 AM  Non-staged Wound Description Partial thickness 11/04/2013 11:00 AM  Treatment Debridement (Selective);Hydrotherapy (Ultrasonic mist);Packing (Saline gauze) 11/04/2013 11:00 AM     Wound / Incision (Open or Dehisced) 10/13/13 Other (Comment) Buttocks Left;Proximal MSDA; includes 9 open areas proximal to large buttock wound (Active)  Dressing Type Gauze (Comment);Other (Comment);ABD;Barrier  Film (skin prep);Moist to moist 11/04/2013 11:00 AM  Dressing Changed Changed 11/04/2013 11:00 AM  Dressing Status Clean;Dry;Intact 11/04/2013 11:00 AM  Dressing Change Frequency Daily 11/04/2013 11:00 AM  Site / Wound Assessment Painful;Pink;Yellow 11/04/2013 11:00 AM  % Wound base Red or Granulating 10% 11/04/2013 11:00 AM  % Wound base Yellow 90% 11/04/2013 11:00 AM  % Wound base Black 0% 11/04/2013 11:00 AM  % Wound base Other (Comment) 0% 11/04/2013 11:00 AM  Peri-wound Assessment Erythema (non-blanchable);Excoriated;Pink 11/04/2013 11:00 AM  Wound Length (cm) 6.3 cm 11/02/2013 10:20 AM  Wound Width (cm) 5.5 cm 11/02/2013 10:20 AM  Wound Depth (cm) 0.1 cm 11/02/2013 10:20 AM  Tunneling (cm) 0.2 10/13/2013  5:12 PM  Margins Unattached edges (unapproximated) 11/04/2013 11:00 AM  Closure None 11/04/2013 11:00 AM  Drainage Amount Minimal 11/04/2013 11:00 AM  Drainage Description Serous 11/04/2013 11:00 AM  Non-staged Wound Description Partial thickness 11/04/2013 11:00 AM  Treatment Debridement (Selective);Hydrotherapy (Ultrasonic mist);Packing (Saline gauze) 11/04/2013 11:00 AM     Incision (Closed) 10/08/13 Thigh Left (Active)  Dressing Type Gauze (Comment) 10/29/2013  7:35 PM  Dressing Clean;Dry 10/29/2013  7:35 PM  Dressing Change Frequency Daily 10/28/2013  1:04 PM  Site / Wound Assessment Painful 10/29/2013  7:35 PM  Margins Unattached edges (unapproximated) 10/28/2013  1:04 PM  Closure Staples 10/23/2013  8:45 PM  Drainage Amount Moderate 10/28/2013  1:04 PM  Drainage Description Serosanguineous;No odor 10/28/2013  1:04 PM  Treatment Cleansed;Other (Comment) 10/28/2013  1:04 PM     Incision (Closed) 10/29/13 Thigh Left (Active)  Dressing Type ABD;Moist to dry 11/04/2013 11:00 AM  Dressing Changed 11/04/2013 11:00 AM  Dressing Change Frequency Other (Comment) 11/04/2013  8:50 AM  Site / Wound Assessment Black;Pink;Painful;Red;Yellow 11/04/2013 11:00 AM  Margins Attached edges (approximated) 11/04/2013 11:00 AM  Closure None 11/04/2013  11:00 AM  Drainage Amount Moderate 11/04/2013 11:00 AM  Drainage Description Serosanguineous;Purulent 11/04/2013 11:00 AM  Treatment Cleansed 11/04/2013 11:00 AM   Hydrotherapy Ultrasonic mist  - wound location: Lt buttock  Ultrasonic mist at 35KHz (+/-3KHz) at ___ percent: 100 % Ultrasonic mist therapy minutes: 8 min (all non-contact 2/2 pt unable to tolerate contact.) Selective Debridement Selective Debridement - Location: Lt buttock wounds Selective Debridement - Tools Used: Other (comment) (tip of ultrasound wand) Selective Debridement - Tissue Removed: yellow slough   Wound Assessment and Plan  Wound Therapy - Assess/Plan/Recommendations Wound Therapy - Clinical Statement: pt continues to have slow increase in granulation tissue throughout wound beds.  Melody Liane Comber, RN, WOC arrived and provided [employer].  Discussed pt with bowel incontinence and old medihoney dressing not inplace on hydro's arrival due to soiled from stool.   Wound Therapy - Functional Problem List: limiting all mobility per PT & OT due to pain Factors Delaying/Impairing Wound Healing: Diabetes Mellitus;Immobility;Multiple medical problems Hydrotherapy Plan: Debridement;Dressing change;Patient/family education;Ultrasonic wound therapy _0  KHz (+/- 3 KHz) Wound Therapy - Frequency: 3X / week Wound Therapy - Current Recommendations: WOC nurse Wound Therapy - Follow Up Recommendations: Skilled nursing facility Wound Plan: see above  Wound Therapy Goals- Improve the function of patient's integumentary system by progressing the wound(s) through the phases of wound healing (inflammation - proliferation - remodeling) by: Decrease Necrotic Tissue to: 70 Decrease Necrotic Tissue - Progress: Progressing toward goal Increase Granulation Tissue to: 30 Increase Granulation Tissue - Progress: Progressing toward goal  Goals will be updated until maximal potential achieved or discharge criteria met.  Discharge criteria:  when goals achieved, discharge from hospital, MD decision/surgical intervention, no progress towards goals, refusal/missing three consecutive treatments without notification or medical reason.  GP     , [place], [place] 11/04/2013, 12:02 PM

## 2013-11-04 NOTE — Progress Notes (Signed)
OT Cancellation Note  Patient Details Name: Donald [name] MRN: [id number] DOB: 1945/10/07   Cancelled Treatment:    Reason Eval/Treat Not Completed: Fatigue/lethargy limiting ability to participate. Pt states he didn't sleep well last night and has had a busy morning with wound care and dressing changes. Pt requests to hold off on OT right now. Will try back later time.  Jules Schick [phone] 11/04/2013, 11:34 AM

## 2013-11-04 NOTE — Progress Notes (Signed)
[facility]  Subjective  - Didn't get much sleep last night.   Objective 154/58 75 98.8 F (37.1 C) (Oral) 17 97%  Intake/Output Summary (Last 24 hours) at 11/04/13 0819 Last data filed at 11/04/13 0700  Gross per 24 hour  Intake   1120 ml  Output   1250 ml  Net   -130 ml    Left AKA central minimal necrotic muscle.  No change from yesterday.   Assessment/Planning:  Left AKA E coli and Enterococcus and are sensitive to Cipro. Cipro started 500 mg po bid. May need further debridement in the next few days.   Laurence Slate [facility] 11/04/2013 8:19 AM --  Laboratory Lab Results: No results found for this basename: WBC, HGB, HCT, PLT,  in the last 72 hours BMET No results found for this basename: NA, K, CL, CO2, GLUCOSE, BUN, CREATININE, CALCIUM,  in the last 72 hours  COAG Lab Results  Component Value Date   INR 1.06 10/07/2013   INR 1.14 09/05/2013   INR 1.01 09/04/2013   No results found for this basename: PTT

## 2013-11-05 DIAGNOSIS — I10 Essential (primary) hypertension: Secondary | ICD-10-CM

## 2013-11-05 DIAGNOSIS — A498 Other bacterial infections of unspecified site: Secondary | ICD-10-CM

## 2013-11-05 DIAGNOSIS — L89899 Pressure ulcer of other site, unspecified stage: Secondary | ICD-10-CM

## 2013-11-05 DIAGNOSIS — L8993 Pressure ulcer of unspecified site, stage 3: Secondary | ICD-10-CM

## 2013-11-05 DIAGNOSIS — B952 Enterococcus as the cause of diseases classified elsewhere: Secondary | ICD-10-CM

## 2013-11-05 DIAGNOSIS — E1159 Type 2 diabetes mellitus with other circulatory complications: Secondary | ICD-10-CM

## 2013-11-05 DIAGNOSIS — Z1159 Encounter for screening for other viral diseases: Secondary | ICD-10-CM

## 2013-11-05 DIAGNOSIS — K5900 Constipation, unspecified: Secondary | ICD-10-CM

## 2013-11-05 DIAGNOSIS — T8740 Infection of amputation stump, unspecified extremity: Principal | ICD-10-CM

## 2013-11-05 DIAGNOSIS — M869 Osteomyelitis, unspecified: Secondary | ICD-10-CM

## 2013-11-05 LAB — CREATININE, SERUM
CREATININE: 1.04 mg/dL (ref 0.50–1.35)
GFR calc Af Amer: 83 mL/min — ABNORMAL LOW (ref >90)
GFR, EST NON AFRICAN AMERICAN: 71 mL/min — AB (ref >90)

## 2013-11-05 LAB — CBC
HCT: 30.1 % — ABNORMAL LOW (ref 39.0–52.0)
Hemoglobin: 9.1 g/dL — ABNORMAL LOW (ref 13.0–17.0)
MCH: 25.9 pg — ABNORMAL LOW (ref 26.0–34.0)
MCHC: 30.2 g/dL (ref 30.0–36.0)
MCV: 85.5 fL (ref 78.0–100.0)
PLATELETS: 512 10*3/uL — AB (ref 150–400)
RBC: 3.52 MIL/uL — ABNORMAL LOW (ref 4.22–5.81)
RDW: 19.5 % — ABNORMAL HIGH (ref 11.5–15.5)
WBC: 11.7 10*3/uL — ABNORMAL HIGH (ref 4.0–10.5)

## 2013-11-05 LAB — GLUCOSE, CAPILLARY
GLUCOSE-CAPILLARY: 92 mg/dL (ref 70–99)
GLUCOSE-CAPILLARY: 142 mg/dL — AB (ref 70–99)
GLUCOSE-CAPILLARY: 83 mg/dL (ref 70–99)
Glucose-Capillary: 75 mg/dL (ref 70–99)

## 2013-11-05 LAB — BASIC METABOLIC PANEL
Anion gap: 14 (ref 5–15)
BUN: 9 mg/dL (ref 6–23)
CO2: 26 mEq/L (ref 19–32)
Calcium: 8.5 mg/dL (ref 8.4–10.5)
Chloride: 97 mEq/L (ref 96–112)
Creatinine, Ser: 0.99 mg/dL (ref 0.50–1.35)
GFR, EST NON AFRICAN AMERICAN: 82 mL/min — AB (ref >90)
Glucose, Bld: 74 mg/dL (ref 70–99)
POTASSIUM: 4 meq/L (ref 3.7–5.3)
Sodium: 137 mEq/L (ref 137–147)

## 2013-11-05 MED ORDER — ALPRAZOLAM 0.5 MG PO TABS
0.5000 mg | ORAL_TABLET | Freq: Every day | ORAL | Status: DC
  Administered 2013-11-05 – 2013-11-07 (×3): 0.5 mg via ORAL
  Filled 2013-11-05 (×3): qty 1

## 2013-11-05 MED ORDER — OXYCODONE-ACETAMINOPHEN 5-325 MG PO TABS
1.0000 | ORAL_TABLET | ORAL | Status: DC | PRN
  Administered 2013-11-10 – 2013-11-11 (×3): 2 via ORAL
  Administered 2013-11-14: 1 via ORAL
  Filled 2013-11-05 (×4): qty 2
  Filled 2013-11-05: qty 1

## 2013-11-05 MED ORDER — GABAPENTIN 300 MG PO CAPS
600.0000 mg | ORAL_CAPSULE | Freq: Three times a day (TID) | ORAL | Status: DC
  Administered 2013-11-05 – 2013-11-15 (×29): 600 mg via ORAL
  Filled 2013-11-05 (×32): qty 2

## 2013-11-05 MED ORDER — DEXTROSE 5 % IV SOLN
2.0000 g | INTRAVENOUS | Status: DC
  Administered 2013-11-05 – 2013-11-15 (×11): 2 g via INTRAVENOUS
  Filled 2013-11-05 (×13): qty 2

## 2013-11-05 MED ORDER — SODIUM CHLORIDE 0.9 % IV SOLN
6.0000 mg/kg | INTRAVENOUS | Status: DC
  Administered 2013-11-05: 420 mg via INTRAVENOUS
  Filled 2013-11-05 (×2): qty 8.4

## 2013-11-05 MED ORDER — DULOXETINE HCL 30 MG PO CPEP: 30.0000 mg | ORAL_CAPSULE | Freq: Every day | ORAL | Status: DC

## 2013-11-05 MED ORDER — ALPRAZOLAM 0.25 MG PO TABS
0.2500 mg | ORAL_TABLET | Freq: Two times a day (BID) | ORAL | Status: DC
  Administered 2013-11-05 – 2013-11-08 (×6): 0.25 mg via ORAL
  Filled 2013-11-05 (×7): qty 1

## 2013-11-05 MED ORDER — LINEZOLID 600 MG PO TABS
600.0000 mg | ORAL_TABLET | Freq: Two times a day (BID) | ORAL | Status: DC
Start: 2013-11-05 — End: 2013-11-05

## 2013-11-05 MED ORDER — POLYETHYLENE GLYCOL 3350 17 G PO PACK
17.0000 g | PACK | Freq: Every day | ORAL | Status: DC | PRN
  Filled 2013-11-05: qty 1

## 2013-11-05 MED ORDER — DULOXETINE HCL 20 MG PO CPEP: 20.0000 mg | ORAL_CAPSULE | Freq: Every day | ORAL | Status: DC

## 2013-11-05 MED ORDER — SENNOSIDES-DOCUSATE SODIUM 8.6-50 MG PO TABS
2.0000 | ORAL_TABLET | Freq: Two times a day (BID) | ORAL | Status: DC
  Administered 2013-11-07 – 2013-11-15 (×6): 2 via ORAL
  Filled 2013-11-05 (×23): qty 2

## 2013-11-05 MED ORDER — DULOXETINE HCL 60 MG PO CPEP
60.0000 mg | ORAL_CAPSULE | Freq: Every day | ORAL | Status: DC
  Administered 2013-11-06 – 2013-11-15 (×10): 60 mg via ORAL
  Filled 2013-11-05 (×10): qty 1

## 2013-11-05 NOTE — Progress Notes (Signed)
NUTRITION FOLLOW UP  DOCUMENTATION CODES  Per approved criteria   -Severe malnutrition in the context of chronic illness    Pt meets criteria for severe MALNUTRITION in the context of chronic illness as evidenced by 8.5% weight loss in the past month, intake </= 75% of estimated nutrition needs for >/= 1 month and severe depletion of muscle mass.   Intervention:   Recommend swallow evaluation with SLP to help with swallowing issues (patient chewing up foods and spitting them out) Recommend place NGT for initiation of tube feeding to meet nutrition needs for wound healing. Ensure Complete PO TID (mixed with ice cream to make a milkshake), each supplement provides 350 kcal and 13 grams of protein  Magic Cup once daily with lunch  MVI daily  Consider appetite stimulant  Nutrition Dx:   Increased nutrient needs related to wounds as evidenced by estimated calorie and protein needs. Ongoing.  Goal:  Intake to meet >90% of estimated nutrition needs. Unmet.   Monitor:  PO intake, labs, weight trend.  Assessment:   68 y.o. male who underwent a left above-the-knee amputation. He was then transferred to rehabilitation. On 7/1 it was noted that he had significant breakdown of his above-the-knee amputation. Transferred to hospital (acute side) on 7/2 for wound debridement. Intraoperatively he was found to have significant necrotic muscle tissue in addition to gross purulence. The wound was aggressively debrided and the bone was shortened. The wound was packed.  Receiving hydrotherapy to left buttock wound. Left buttock wound is improving per WOC note, evolution into stage 3 pressure ulcers from unstageable pressure ulcers.  Intake of meals remains suboptimal. Patient will not drink any of the supplements because they are too sweet, even mixed with ice cream (Ensure Complete and Glucerna Shake) or [employer]).   Patient c/o difficulty swallowing; his wife reports that he chews up  his food, then spits it out. Intake is not adequate to meet increased nutrition needs to support wound healing.  Height: Ht Readings from Last 1 Encounters:  10/29/13 5\' 9"  (1.753 m)    Weight Status:   Wt Readings from Last 1 Encounters:  11/02/13 154 lb 5.2 oz (70 kg)   10/29/13  151 lb 10.8 oz (68.8 kg)   10/13/13  165 lb 10.4 oz (75.137 kg)   Re-estimated needs:  Kcal: 2000-2200  Protein: 100-120 gm  Fluid: 2-2.2 L  Skin:  left thigh amputation site wound Stage III pressure ulcer with associated MASD to left buttock  Diet Order: Carb Control  Supplements: Ensure Complete mixed with ice cream TID between meals, Magic Cup once daily with a meal, MVI daily   Intake/Output Summary (Last 24 hours) at 11/05/13 0848 Last data filed at 11/05/13 0700  Gross per 24 hour  Intake   1280 ml  Output   1375 ml  Net    -95 ml    Last BM: 7/9   Labs:   Recent Labs Lab 10/30/13 0435 10/31/13 0500 11/01/13 0030 11/01/13 0545 11/05/13 0500  NA 130* 134* 136*  --   --   K 4.1 4.1 4.2  --   --   CL 91* 96 98  --   --   CO2 26 25 25   --   --   BUN 9 10 10   --   --   CREATININE 0.86 1.10 1.09  --  1.04  CALCIUM 8.3* 8.3* 8.1*  --   --   MG  --   --   --  2.2  --   GLUCOSE 126* 87 93  --   --     CBG (last 3)   Recent Labs  11/04/13 1554 11/04/13 2201 11/05/13 0756  GLUCAP 95 105* 92    Scheduled Meds: . ciprofloxacin  500 mg Oral Q12H  . collagenase   Topical Daily  . dipyridamole-aspirin  1 capsule Oral BID  . DULoxetine  30 mg Oral Daily  . enoxaparin (LOVENOX) injection  40 mg Subcutaneous Q24H  . feeding supplement (ENSURE COMPLETE)  237 mL Oral TID BM  . ferrous fumarate  1 tablet Oral BID  . ferrous sulfate  325 mg Oral BID WC  . gabapentin  300 mg Oral TID  . insulin aspart  0-5 Units Subcutaneous QHS  . insulin aspart  0-9 Units Subcutaneous TID WC  . insulin detemir  10 Units Subcutaneous Daily  . lisinopril  2.5 mg Oral Daily  . metFORMIN   500 mg Oral BID WC  . metoprolol tartrate  50 mg Oral BID  . multivitamin with minerals  1 tablet Oral Daily  . omega-3 acid ethyl esters  2 g Oral BID  . OxyCODONE  40 mg Oral Q12H  . pantoprazole  40 mg Oral Daily  . saccharomyces boulardii  250 mg Oral BID  . vitamin C  500 mg Oral Daily    Continuous Infusions: . sodium chloride 50 mL/hr at 11/04/13 1103    Molli Barrows, RD, LDN, [place] Pager [phone] After Hours Pager [phone]

## 2013-11-05 NOTE — Progress Notes (Signed)
Physical Therapy Treatment Patient Details  Name: AL BRACEWELL  MRN: [id number]  DOB: Nov 25, 1945  Today's Date: 11/05/2013     11/05/13 0900  PT Visit Information  Last PT Received On 11/05/13  Assistance Needed +2  PT/OT/SLP Co-Evaluation/Treatment Yes  Reason for Co-Treatment Complexity of the patient's impairments (multi-system involvement)  PT goals addressed during session Mobility/safety with mobility;Strengthening/ROM  History of Present Illness 68 yo male s/p Lt aka readmission for CIR for I&D and new sacral wound. HX May 2015 for Lt femoral-popliteal bypass. pt has hx of Lt side affect CVA, Lt aka 10/09/13 rectal CA (2009) with metastases in liver(2012) , HTN, gout, PVD, Ferd, CAD, and DM2   Subjective Data  Subjective i will try  Patient Stated Goal to get back  home to cats  Precautions  Precautions Fall  Precaution Comments LT AKA wrap flexiseal foley  Restrictions  Weight Bearing Restrictions No  Cognition  Arousal/Alertness Awake/alert  Behavior During Therapy WFL for tasks assessed/performed  Overall Cognitive Status Within Functional Limits for tasks assessed  Bed Mobility  Overal bed mobility Needs Assistance  Bed Mobility Rolling  Rolling Mod assist (to left, min assist to right)  Amputee Exercises  Quad Sets AROM;Right;10 reps  Other Exercises  Other Exercises 22 minutes of graded weight bearing through RLE (standing on footboard bed tilted to 45 degrees)   Other Exercises mini squats performed in graded weight bearing at tilted 45 degrees  PT - End of Session  Equipment Utilized During Treatment Oxygen  Activity Tolerance Patient limited by fatigue;Patient limited by pain  Patient left in bed;with call bell/phone within reach;with family/visitor present  Nurse Communication Mobility status  PT - Assessment/Plan  PT Plan Current plan remains appropriate  PT Frequency Min 3X/week  Recommendations for Other Services Rehab consult  Follow Up  Recommendations SNF;Supervision/Assistance - 24 hour  PT equipment Other (comment) (TBA)  PT Goal Progression  Progress towards PT goals Progressing toward goals  Acute Rehab PT Goals  PT Goal Formulation With patient  Time For Goal Achievement 11/13/13  Potential to [place]  PT General Charges  $$ ACUTE PT VISIT 1 Procedure  PT Treatments  $Therapeutic Activity 8-22 mins    Alben Deeds, PT DPT  [phone]

## 2013-11-05 NOTE — Clinical Social Work Note (Signed)
CSW updated facilities on patient's condition.   Liz Beach MSW, [place], [place], [phone]

## 2013-11-05 NOTE — Progress Notes (Addendum)
ANTIBIOTIC CONSULT NOTE - FOLLOW UP  Pharmacy Consult for daptomycin Indication: L AKA wound infection/osteomyelitis  Allergies  Allergen Reactions  . Other     Became hypotensive. Pt states allergic to "muscle relaxer" but unsure of name.  . Tape Hives and Itching    medfix non woven tape    Patient Measurements: Height: 5\' 9"  (175.3 cm) Weight: 154 lb 5.2 oz (70 kg) IBW/kg (Calculated) : 70.7   Vital Signs: Temp: 98.3 F (36.8 C) (07/09 1520) Temp src: Axillary (07/09 1520) BP: 136/89 mmHg (07/09 1150) Pulse Rate: 69 (07/09 1150) Intake/Output from previous day: 07/08 0701 - 07/09 0700 In: 1680 [P.O.:480; I.V.:1200] Out: 1375 [Urine:1375] Intake/Output from this shift: Total I/O In: 350 [I.V.:350] Out: 450 [Urine:450]  Labs:  Recent Labs  11/05/13 0500  CREATININE 1.04   Estimated Creatinine Clearance: 66.4 ml/min (by C-G formula based on Cr of 1.04). No results found for this basename: VANCOTROUGH, Corlis Leak, VANCORANDOM, GENTTROUGH, GENTPEAK, GENTRANDOM, TOBRATROUGH, TOBRAPEAK, TOBRARND, AMIKACINPEAK, AMIKACINTROU, AMIKACIN,  in the last 72 hours   Microbiology: Recent Results (from the past 720 hour(s))  SURGICAL PCR SCREEN     Status: None   Collection Time    10/08/13 12:12 AM      Result Value Ref Range Status   MRSA, PCR NEGATIVE  NEGATIVE Final   Staphylococcus aureus NEGATIVE  NEGATIVE Final   Comment:            The Xpert SA Assay (FDA     approved for NASAL specimens     in patients over 70 years of age),     is one component of     a comprehensive surveillance     program.  Test performance has     been validated by [employer] for patients greater     than or equal to 63 year old.     It is not intended     to diagnose infection nor to     guide or monitor treatment.  URINE CULTURE     Status: None   Collection Time    10/27/13  5:13 PM      Result Value Ref Range Status   Specimen Description URINE, CLEAN CATCH   Final   Special Requests NONE   Final   Culture  Setup Time     Final   Value: 10/27/2013 19:13     Performed at [place]     Final   Value: >=100,000 COLONIES/ML     Performed at [facility]   Culture     Final   Value: Multiple bacterial morphotypes present, none predominant. Suggest appropriate recollection if clinically indicated.     Performed at [facility]   Report Status 10/28/2013 FINAL   Final  WOUND CULTURE     Status: None   Collection Time    10/29/13 10:25 AM      Result Value Ref Range Status   Specimen Description WOUND LEFT LEG   Final   Special Requests LEFT ABOVE KNEE PT ON VANCO ZOSYN   Final   Gram Stain     Final   Value: ABUNDANT WBC PRESENT, PREDOMINANTLY PMN     NO SQUAMOUS EPITHELIAL CELLS SEEN     MODERATE GRAM POSITIVE COCCI     IN PAIRS FEW GRAM NEGATIVE RODS     Performed at [facility]     Final  Value: ABUNDANT ESCHERICHIA COLI     MODERATE VANCOMYCIN RESISTANT ENTEROCOCCUS ISOLATED     Note: CRITICAL RESULT CALLED TO, READ BACK BY AND VERIFIED WITH: TRACIE DAVIS@[email or web address]  ON [id number] BY [facility]     Performed at [facility]   Report Status 11/02/2013 FINAL   Final   Organism ID, Bacteria ESCHERICHIA COLI   Final   Organism ID, Bacteria VANCOMYCIN RESISTANT ENTEROCOCCUS ISOLATED   Final  ANAEROBIC CULTURE     Status: None   Collection Time    10/29/13 10:25 AM      Result Value Ref Range Status   Specimen Description WOUND LEFT LEG   Final   Special Requests LEFT ABOVE KNEE PT ON VANCO ZOSYN   Final   Gram Stain     Final   Value: MODERATE WBC PRESENT, PREDOMINANTLY PMN     NO SQUAMOUS EPITHELIAL CELLS SEEN     MODERATE GRAM POSITIVE COCCI     IN PAIRS FEW GRAM NEGATIVE RODS     Performed at [facility]   Culture     Final   Value: NO ANAEROBES ISOLATED     Performed at [facility]   Report Status 11/03/2013 FINAL   Final    Anti-infectives   Start     Dose/Rate  Route Frequency Ordered Stop   11/05/13 1630  cefTRIAXone (ROCEPHIN) 2 g in dextrose 5 % 50 mL IVPB     2 g 100 mL/hr over 30 Minutes Intravenous Every 24 hours 11/05/13 1518     11/05/13 1530  linezolid (ZYVOX) tablet 600 mg  Status:  Discontinued     600 mg Oral Every 12 hours 11/05/13 1518 11/05/13 1521   11/02/13 1200  ciprofloxacin (CIPRO) tablet 500 mg  Status:  Discontinued     500 mg Oral Every 12 hours 11/02/13 0939 11/05/13 1518   11/02/13 0800  ciprofloxacin (CIPRO) tablet 500 mg  Status:  Discontinued     500 mg Oral 2 times daily 11/02/13 0734 11/02/13 0939   10/29/13 2200  piperacillin-tazobactam (ZOSYN) IVPB 3.375 g  Status:  Discontinued     3.375 g 12.5 mL/hr over 240 Minutes Intravenous Every 8 hours 10/29/13 1507 11/02/13 0734   10/29/13 1630  vancomycin (VANCOCIN) IVPB 1000 mg/200 mL premix  Status:  Discontinued     1,000 mg 200 mL/hr over 60 Minutes Intravenous Every 12 hours 10/29/13 1512 11/02/13 0734      Assessment: 68 yo male with left leg wound (noted s/p L AKA on 10/29/13) and cultures showing VRE and E.coli. Pharmacy has been consulted to dose daptomycin per ID recommendations.  Patient is also noted on rocephin.SCr= 1.04 and CrCl ~ 65.  7/9 daptomycin 7/9 rocephin>> 7/6 cipro >>7/9 7/2 vancomycin>> 7/6 7/2 zosyn>> 7/6  cultures 7/2 left leg wound: VRE and E. Coli (pan-S)   Plan:  -Daptomycin 420mg  (6mg /kg) IV q24hr -Will follow renal function, weekly CPKs and clinical progress  Hildred Laser, Pharm D 11/05/2013 3:55 PM

## 2013-11-05 NOTE — Consult Note (Addendum)
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 Trula Slade   HPI: Donald [name] is an 68 y.o. male. who underwent a left above-the-knee amputation. He was then transferred to rehabilitation. It was noted that he had significant breakdown of his above-the-knee amputation.  Dr. Doren Custard to come to the operating room. In the operating room he found:" significant necrotic tissue and purulence. There was a large amount of muscle which did not appear viable.  significant amount of the femur was shortened and  significant muscle, fascia, and adipose tissue in addition to skin all debrided.  He was transferred back from rehabilitation to the hospital and placed on broad-spectrum antibiotics. In the interim his intraoperative cultures have come back with vancomycin resistant enterococcus and Escherichia coli  Concerned that he may not they'll to keep his leg and it may require disarticulation from the hip to cure this infection.    Past Medical History  Diagnosis Date  . GERD (gastroesophageal reflux disease)   . Hyperlipidemia   . Hypertension   . Peripheral vascular disease   . Internal and external bleeding hemorrhoids   . History of gout     left great toe  . Depression   . Numbness in right leg     Hx: of diabetes  . Coronary artery disease     RCA occlusion with good collaterals, o/w no obs CAD 12/2011  . Pneumonia ~ 1977  . Type II diabetes mellitus   . History of blood transfusion 12/2011; 12/2012    "before OR; after OR" (01/22/2013)  . Stroke 2008    "left side weak; unable to move left hand still" (01/22/2013)  . Rectal cancer dx'd 08/2007    S/P chemo, radiation, biopsies  . Metastases to the liver dx'd 08/2010    Past Surgical History  Procedure Laterality Date  . Carotid endarterectomy  Right 2008  . Liver cryoablation  08/2010    "chemo shrunk the cancer 50% then they went in and burned the rest of it"  . Axillary-femoral bypass graft  01/29/2012    Procedure: BYPASS GRAFT AXILLA-BIFEMORAL;  Surgeon: Elam Dutch, MD;  Location: [facility] OR;  Service: Vascular;  Laterality: N/A;  Left axilla-bifemoral bypass using gore-tex graft.   . Colonoscopy w/ biopsies and polypectomy      Hx: of  . Femoral-popliteal bypass graft Left 01/06/2013    Procedure: Femoral-Popliteal Artery Bypass;  Surgeon: Elam Dutch, MD;  Location: [place];  Service: Vascular;  Laterality: Left;  . Intraoperative arteriogram Left 01/06/2013    Procedure: INTRA OPERATIVE ARTERIOGRAM;  Surgeon: Elam Dutch, MD;  Location: [place];  Service: Vascular;  Laterality: Left;  . Femoral-popliteal bypass graft Left 01/10/2013    Procedure: THROMBECTOMY POSSIBLE BYPASS GRAFT FEMORAL-POPLITEAL ARTERY;  Surgeon: Mal Misty, MD;  Location: [place];  Service: Vascular;  Laterality: Left;  Attempted Thrombectomy of left Femoral/Popliteal graft - Unsuccessful.  Insertion of Left femoral to below the knee popliteal propaten graft.  . Mass biopsy  2009    "found mass that was cancer; later had chemo and radiation; never cut mass out" (01/22/2013)  . Femoral-popliteal bypass graft Left 09/05/2013    Procedure: Thrombectomy of Left Femoral-Below Knee Popliteal Bypass Graft; Patch Angioplasty of Distal Portion of Bypass Graft;  Surgeon: Angelia Mould, MD;  Location:  [facility] OR;  Service: Vascular;  Laterality: Left;  . Patch angioplasty Left 09/05/2013    Procedure: Patch Angioplasty Distal Portion of Left Femoral-Below Knee Popliteal Bypass Graft;  Surgeon: Angelia Mould, MD;  Location: [place];  Service: Vascular;  Laterality: Left;  . Intraoperative arteriogram Left 09/05/2013    Procedure: INTRA OPERATIVE ARTERIOGRAM;  Surgeon: Angelia Mould, MD;  Location: [place];  Service: Vascular;  Laterality: Left;  . Fasciotomy  Left 09/05/2013    Procedure: FASCIOTOMY;  Surgeon: Angelia Mould, MD;  Location: [place];  Service: Vascular;  Laterality: Left;  Marland Kitchen Vascular surgery    . Fasciotomy closure Left 09/11/2013    Procedure: CLOSURE OF FASCIOTOMY SITES LEFT LOWER EXTREMITY AND WOUND VAC APPLICATION;  Surgeon: Angelia Mould, MD;  Location: [place];  Service: Vascular;  Laterality: Left;  . Amputation Left 10/08/2013    Procedure: LEFT ABOVE KNEE AMPUTATION;  Surgeon: Serafina Mitchell, MD;  Location: [place];  Service: Vascular;  Laterality: Left;  . I&d extremity Left 10/29/2013    Procedure: IRRIGATION AND DEBRIDEMENT LEFT ABOVE KNEE AMPUTATION;  Surgeon: Angelia Mould, MD;  Location: [place];  Service: Vascular;  Laterality: Left;  . Amputation Left 10/29/2013    Procedure: REVISION AMPUTATION ABOVE KNEE;  Surgeon: Angelia Mould, MD;  Location: [place];  Service: Vascular;  Laterality: Left;  ergies:   Allergies  Allergen Reactions  . Other     Became hypotensive. Pt states allergic to "muscle relaxer" but unsure of name.  . Tape Hives and Itching    medfix non woven tape     Medications: I have reviewed patients current medications as documented in Epic Anti-infectives   Start     Dose/Rate Route Frequency Ordered Stop   11/05/13 1700  DAPTOmycin (CUBICIN) 420 mg in sodium chloride 0.9 % IVPB     6 mg/kg  70 kg 216.8 mL/hr over 30 Minutes Intravenous Every 24 hours 11/05/13 1602     11/05/13 1630  cefTRIAXone (ROCEPHIN) 2 g in dextrose 5 % 50 mL IVPB     2 g 100 mL/hr over 30 Minutes Intravenous Every 24 hours 11/05/13 1518     11/05/13 1530  linezolid (ZYVOX) tablet 600 mg  Status:  Discontinued     600 mg Oral Every 12 hours 11/05/13 1518 11/05/13 1521   11/02/13 1200  ciprofloxacin (CIPRO) tablet 500 mg  Status:  Discontinued     500 mg Oral Every 12 hours 11/02/13 0939 11/05/13 1518   11/02/13 0800  ciprofloxacin (CIPRO) tablet 500 mg  Status:  Discontinued     500 mg Oral 2 times  daily 11/02/13 0734 11/02/13 0939   10/29/13 2200  piperacillin-tazobactam (ZOSYN) IVPB 3.375 g  Status:  Discontinued     3.375 g 12.5 mL/hr over 240 Minutes Intravenous Every 8 hours 10/29/13 1507 11/02/13 0734   10/29/13 1630  vancomycin (VANCOCIN) IVPB 1000 mg/200 mL premix  Status:  Discontinued     1,000 mg 200 mL/hr over 60 Minutes Intravenous Every 12 hours 10/29/13 1512 11/02/13 0734      Social History:  reports that he has quit smoking. His smoking use included Cigarettes. He has a 5.7 pack-year smoking history. He has never used smokeless tobacco. He reports that he does not drink alcohol or use illicit drugs.  Family History  Problem Relation Age of Onset  . Cancer - Other Sister     As in HPI and primary teams notes otherwise 10  point review of systems is negative  Blood pressure 136/89, pulse 69, temperature 98.3 F (36.8 C), temperature source Axillary, resp. rate 19, height _0  (1.753 m), weight 154 lb 5.2 oz (70 kg), SpO2 94.00%. General: Alert and awake, oriented x3, not in any acute distress. HEENT: anicteric sclera, pupils reactive to light and accommodation, EOMI, oropharynx clear and without exudate CVS regular rate, normal r,  no murmur rubs or gallops Chest: clear to auscultation bilaterally, no wheezing, rales or rhonchi Abdomen: soft nontender, nondistended, normal bowel sounds, Extremities: AKA site dressed Skin: Stage III decubitus ulcer not examined Neuro: left sided weakness   Results for orders placed during the hospital encounter of 10/29/13 (from the past 48 hour(s))  GLUCOSE, CAPILLARY     Status: None   Collection Time    11/03/13  8:28 PM      Result Value Ref Range   Glucose-Capillary 74  70 - 99 mg/dL  GLUCOSE, CAPILLARY     Status: None   Collection Time    11/03/13  9:32 PM      Result Value Ref Range   Glucose-Capillary 96  70 - 99 mg/dL  GLUCOSE, CAPILLARY     Status: None   Collection Time    11/04/13  4:47 AM      Result  Value Ref Range   Glucose-Capillary 80  70 - 99 mg/dL  GLUCOSE, CAPILLARY     Status: None   Collection Time    11/04/13  7:30 AM      Result Value Ref Range   Glucose-Capillary 92  70 - 99 mg/dL   Comment 1 Documented in Chart     Comment 2 Notify RN    GLUCOSE, CAPILLARY     Status: Abnormal   Collection Time    11/04/13 11:51 AM      Result Value Ref Range   Glucose-Capillary 128 (*) 70 - 99 mg/dL  GLUCOSE, CAPILLARY     Status: None   Collection Time    11/04/13  3:54 PM      Result Value Ref Range   Glucose-Capillary 95  70 - 99 mg/dL  GLUCOSE, CAPILLARY     Status: Abnormal   Collection Time    11/04/13 10:01 PM      Result Value Ref Range   Glucose-Capillary 105 (*) 70 - 99 mg/dL   Comment 1 Notify RN    CREATININE, SERUM     Status: Abnormal   Collection Time    11/05/13  5:00 AM      Result Value Ref Range   Creatinine, Ser 1.04  0.50 - 1.35 mg/dL   GFR calc non Af Amer 71 (*) >90 mL/min   GFR calc Af Amer 83 (*) >90 mL/min   Comment: (NOTE)     The eGFR has been calculated using the CKD EPI equation.     This calculation has not been validated in all clinical situations.     eGFR's persistently <90 mL/min signify possible Chronic Kidney     Disease.  GLUCOSE, CAPILLARY     Status: None   Collection Time    11/05/13  7:56 AM      Result Value Ref Range   Glucose-Capillary 92  70 - 99 mg/dL  GLUCOSE, CAPILLARY     Status: Abnormal   Collection Time    11/05/13 11:47 AM      Result Value Ref Range   Glucose-Capillary 142 (*) 70 - 99 mg/dL   Comment  1 Documented in Chart     Comment 2 Notify RN     _0 (sdes,specrequest,cult,reptstatus)   ) Recent Results (from the past 720 hour(s))  SURGICAL PCR SCREEN     Status: None   Collection Time    10/08/13 12:12 AM      Result Value Ref Range Status   MRSA, PCR NEGATIVE  NEGATIVE Final   Staphylococcus aureus NEGATIVE  NEGATIVE Final   Comment:            The Xpert SA Assay (FDA     approved for  NASAL specimens     in patients over 11 years of age),     is one component of     a comprehensive surveillance     program.  Test performance has     been validated by [employer] for patients greater     than or equal to 29 year old.     It is not intended     to diagnose infection nor to     guide or monitor treatment.  URINE CULTURE     Status: None   Collection Time    10/27/13  5:13 PM      Result Value Ref Range Status   Specimen Description URINE, CLEAN CATCH   Final   Special Requests NONE   Final   Culture  Setup Time     Final   Value: 10/27/2013 19:13     Performed at [facility]     Final   Value: >=100,000 COLONIES/ML     Performed at [facility]   Culture     Final   Value: Multiple bacterial morphotypes present, none predominant. Suggest appropriate recollection if clinically indicated.     Performed at [facility]   Report Status 10/28/2013 FINAL   Final  WOUND CULTURE     Status: None   Collection Time    10/29/13 10:25 AM      Result Value Ref Range Status   Specimen Description WOUND LEFT LEG   Final   Special Requests LEFT ABOVE KNEE PT ON VANCO ZOSYN   Final   Gram Stain     Final   Value: ABUNDANT WBC PRESENT, PREDOMINANTLY PMN     NO SQUAMOUS EPITHELIAL CELLS SEEN     MODERATE GRAM POSITIVE COCCI     IN PAIRS FEW GRAM NEGATIVE RODS     Performed at [facility]   Culture     Final   Value: ABUNDANT ESCHERICHIA COLI     MODERATE VANCOMYCIN RESISTANT ENTEROCOCCUS ISOLATED     Note: CRITICAL RESULT CALLED TO, READ BACK BY AND VERIFIED WITH: TRACIE DAVIS_1  ON [id number] BY [facility]     Performed at [facility]   Report Status 11/02/2013 FINAL   Final   Organism ID, Bacteria ESCHERICHIA COLI   Final   Organism ID, Bacteria VANCOMYCIN RESISTANT ENTEROCOCCUS ISOLATED   Final  ANAEROBIC CULTURE     Status: None   Collection Time    10/29/13 10:25 AM      Result Value Ref Range Status    Specimen Description WOUND LEFT LEG   Final   Special Requests LEFT ABOVE KNEE PT ON VANCO ZOSYN   Final   Gram Stain     Final   Value: MODERATE WBC PRESENT, PREDOMINANTLY PMN     NO SQUAMOUS EPITHELIAL CELLS SEEN     MODERATE  GRAM POSITIVE COCCI     IN PAIRS FEW GRAM NEGATIVE RODS     Performed at [facility]   Culture     Final   Value: NO ANAEROBES ISOLATED     Performed at [facility]   Report Status 11/03/2013 FINAL   Final     Impression/Recommendation  Principal Problem:   PAF (paroxysmal atrial fibrillation) Active Problems:   HYPERLIPIDEMIA   Anal cancer   Peripheral vascular disease   Hemiparesis affecting left side as late effect of stroke   HTN (hypertension)   Diabetes mellitus with circulatory complication   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  --Plan on treating with 8 weeks of IV antibiotics. Note to be no effective oral antibiotics I can go to that would cover the VRE long term. Zyvox is a viable oral option but only for short time inevitably the patient would develop thrombocytopenia. Since he has had extensive amputation hopefully those specific organisms will not be necessarily the problem. I am worried though that he will ultimately require a hip disarticulation is Dr. Scot Dock as described.  #2 stage III decubitus ulcer related to examine this.  #3 screening we'll check for HIV and hepatitis panel   11/05/2013, 5:44 PM   Thank you so much for this interesting consult  [facility] [place] [phone] (pager) [phone] (office) 11/05/2013, 5:44 PM  Rhina [place] 11/05/2013, 5:44 PM

## 2013-11-05 NOTE — Consult Note (Signed)
[facility] Medical Consultation  Donald [name] YPP:[id number] DOB: [birth year] DOA: 10/29/2013 PCP: Maggie Font, MD   Requesting physician: Dr Trula Slade Date of consultation: 7-09 Reason for consultation: management of chronic medical problems.   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 Patient S/P irrigation and debridement on 7-2. Continue with wound care. ID consulted to help with antibiotics guidance. Started on ceftriaxone and Daptomycin.  Leukocytosis; in setting of AKA wound infection. Repeat labs in am. Continue with antibiotics.   Diabetes: I will discontinue metformin while in hospital incase patient required contrast test. Will discontinue  levemir 10. Patient poor oral intake. continue with SSI.   Encephalopathy; patient lethargic. Probably related to pain medication. He just got medication. Need to be cautious with oversedation.   Hypertension: Continue with lisinopril. Follow renal function. Continue with metoprolol.   A fib: Continue with metoprolol. Patient has decline anticoagulation. Per cardiologist note patient is on  Aggrenox which might  provide some embolism protection. ECHO with normal Ef at 55 to 60%.  Fecal incontinence: after treatment for rectal cancer.  Depression: Continue with Cymbalta.  Severe protein caloric malnutrition: Continue with ensure TID.  Stage III pressure ulcer: Wound care following. Local wound care.  DVT prophylaxis: Lovenox.   I will followup again tomorrow. Please contact me if I can be of assistance  in the meanwhile. Thank you for this consultation.  Chief Complaint: Difficulty with swallowing.   HPI:  68 year old with PMH significant for Hypertension, Peripheral Vascular diseases, CAD, Stroke, history of rectal cancer S/P chemo and radiation, 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  Transfer from Rehab 7-2 with non healing stump, S/P debridement 7-2. Wound culture grew VRE and E coli.   Patient developed paroxysmal A fib. Cardiology was consulted. He converted to sinus rhythm. He is on beta-blocker. Patient has refused anticoagulation.  Patient relates difficulty with swallowing, food doesn't go down for last few days. He has some difficulty clearing throat  From secretions. He has abdominal pain some times, specially when he takes pills. He has some fecal incontinence ever since he had radiation and treatment for rectal cancer.   Review of Systems:  Negative, except as per HPI.   Past Medical History  Diagnosis Date  . GERD (gastroesophageal reflux disease)   . Hyperlipidemia   . Hypertension   . Peripheral vascular disease   . Internal and external bleeding hemorrhoids   . History of gout     left great toe  . Depression   . Numbness in right leg     Hx: of diabetes  . Coronary artery disease     RCA occlusion with good collaterals, o/w no obs CAD 12/2011  . Pneumonia ~ 1977  . Type II diabetes mellitus   . History of blood transfusion 12/2011; 12/2012    "before OR; after OR" (01/22/2013)  . Stroke 2008    "left side weak; unable to move left hand still" (01/22/2013)  . Rectal cancer  dx'd 08/2007    S/P chemo, radiation, biopsies  . Metastases to the liver dx'd 08/2010   Past Surgical History  Procedure Laterality Date  . Carotid endarterectomy Right 2008  . Liver cryoablation  08/2010    "chemo shrunk the cancer 50% then they went in and burned the rest of it"  . Axillary-femoral bypass graft   01/29/2012    Procedure: BYPASS GRAFT AXILLA-BIFEMORAL;  Surgeon: Elam Dutch, MD;  Location: [facility] OR;  Service: Vascular;  Laterality: N/A;  Left axilla-bifemoral bypass using gore-tex graft.   . Colonoscopy w/ biopsies and polypectomy      Hx: of  . Femoral-popliteal bypass graft Left 01/06/2013    Procedure: Femoral-Popliteal Artery Bypass;  Surgeon: Elam Dutch, MD;  Location: [place];  Service: Vascular;  Laterality: Left;  . Intraoperative arteriogram Left 01/06/2013    Procedure: INTRA OPERATIVE ARTERIOGRAM;  Surgeon: Elam Dutch, MD;  Location: [place];  Service: Vascular;  Laterality: Left;  . Femoral-popliteal bypass graft Left 01/10/2013    Procedure: THROMBECTOMY POSSIBLE BYPASS GRAFT FEMORAL-POPLITEAL ARTERY;  Surgeon: Mal Misty, MD;  Location: [place];  Service: Vascular;  Laterality: Left;  Attempted Thrombectomy of left Femoral/Popliteal graft - Unsuccessful.  Insertion of Left femoral to below the knee popliteal propaten graft.  . Mass biopsy  2009    "found mass that was cancer; later had chemo and radiation; never cut mass out" (01/22/2013)  . Femoral-popliteal bypass graft Left 09/05/2013    Procedure: Thrombectomy of Left Femoral-Below Knee Popliteal Bypass Graft; Patch Angioplasty of Distal Portion of Bypass Graft;  Surgeon: Angelia Mould, MD;  Location: [place];  Service: Vascular;  Laterality: Left;  . Patch angioplasty Left 09/05/2013    Procedure: Patch Angioplasty Distal Portion of Left Femoral-Below Knee Popliteal Bypass Graft;  Surgeon: Angelia Mould, MD;  Location: [place];  Service: Vascular;  Laterality: Left;  . Intraoperative arteriogram Left 09/05/2013    Procedure: INTRA OPERATIVE ARTERIOGRAM;  Surgeon: Angelia Mould, MD;  Location: [place];  Service: Vascular;  Laterality: Left;  . Fasciotomy Left 09/05/2013    Procedure: FASCIOTOMY;  Surgeon: Angelia Mould, MD;  Location: [place];  Service: Vascular;  Laterality: Left;  Marland Kitchen Vascular surgery     . Fasciotomy closure Left 09/11/2013    Procedure: CLOSURE OF FASCIOTOMY SITES LEFT LOWER EXTREMITY AND WOUND VAC APPLICATION;  Surgeon: Angelia Mould, MD;  Location: [place];  Service: Vascular;  Laterality: Left;  . Amputation Left 10/08/2013    Procedure: LEFT ABOVE KNEE AMPUTATION;  Surgeon: Serafina Mitchell, MD;  Location: [place];  Service: Vascular;  Laterality: Left;  . I&d extremity Left 10/29/2013    Procedure: IRRIGATION AND DEBRIDEMENT LEFT ABOVE KNEE AMPUTATION;  Surgeon: Angelia Mould, MD;  Location: [place];  Service: Vascular;  Laterality: Left;  . Amputation Left 10/29/2013    Procedure: REVISION AMPUTATION ABOVE KNEE;  Surgeon: Angelia Mould, MD;  Location[facility] OR;  Service: Vascular;  Laterality: Left;   Social History:  reports that he has quit smoking. His smoking use included Cigarettes. He has a 5.7 pack-year smoking history. He has never used smokeless tobacco. He reports that he does not drink alcohol or use illicit drugs.  Allergies  Allergen Reactions  . Other     Became hypotensive. Pt states allergic to "muscle relaxer" but unsure of name.  . Tape Hives and Itching    medfix non woven tape   Family History  Problem  Relation Age of Onset  . Cancer - Other Sister     Prior to Admission medications   Medication Sig Start Date End Date Taking? Authorizing Provider  Ascorbic Acid (VITAMIN C) 1000 MG tablet Take 500 mg by mouth daily.    Yes Historical Provider, MD  dipyridamole-aspirin (AGGRENOX) 200-25 MG per 12 hr capsule Take 1 capsule by mouth 2 (two) times daily.   Yes Historical Provider, MD  docusate sodium (COLACE) 100 MG capsule Take 200 mg by mouth 2 (two) times daily as needed for mild constipation.   Yes Historical Provider, MD  docusate sodium 100 MG CAPS Take 100 mg by mouth 2 (two) times daily. 10/13/13  Yes Orson Eva, MD  DULoxetine (CYMBALTA) 30 MG capsule Take 1 capsule (30 mg total) by mouth daily. 10/13/13  Yes Orson Eva, MD    feeding supplement, GLUCERNA SHAKE, (GLUCERNA SHAKE) LIQD Take 237 mLs by mouth daily. 10/13/13  Yes Orson Eva, MD  ferrous fumarate (HEMOCYTE - 106 MG FE) 325 (106 FE) MG TABS tablet Take 1 tablet (106 mg of iron total) by mouth 2 (two) times daily. 09/24/13  Yes Ivan Anchors Love, PA-C  ferrous sulfate 325 (65 FE) MG tablet Take 1 tablet (325 mg total) by mouth 2 (two) times daily with a meal. 10/13/13  Yes Orson Eva, MD  gabapentin (NEURONTIN) 300 MG capsule Take 1 capsule (300 mg total) by mouth 3 (three) times daily. 05/15/13  Yes Dennie Bible, NP  insulin detemir (LEVEMIR) 100 UNIT/ML injection Inject 10 Units into the skin daily.   Yes Historical Provider, MD  lisinopril (PRINIVIL,ZESTRIL) 5 MG tablet Take 2.5 mg by mouth daily.   Yes Historical Provider, MD  metFORMIN (GLUCOPHAGE) 500 MG tablet Take 500 mg by mouth 2 (two) times daily with a meal.   Yes Historical Provider, MD  omega-3 acid ethyl esters (LOVAZA) 1 G capsule Take 2 g by mouth 2 (two) times daily.    Yes Historical Provider, MD  omeprazole (PRILOSEC) 40 MG capsule Take 40 mg by mouth daily.   Yes Historical Provider, MD  oxyCODONE (OXY IR/ROXICODONE) 5 MG immediate release tablet Take 1-2 tablets (5-10 mg total) by mouth every 6 (six) hours as needed for severe pain. 09/24/13  Yes Ivan Anchors Love, PA-C  OxyCODONE (OXYCONTIN) 40 mg T12A 12 hr tablet Take 1 tablet (40 mg total) by mouth every 12 (twelve) hours. 10/13/13  Yes Orson Eva, MD  oxyCODONE-acetaminophen (PERCOCET/ROXICET) 5-325 MG per tablet Take 1 tablet by mouth every 4 (four) hours as needed for moderate pain or severe pain.   Yes Historical Provider, MD  saccharomyces boulardii (FLORASTOR) 250 MG capsule Take 1 capsule (250 mg total) by mouth 2 (two) times daily. probiotic 09/24/13  Yes Ivan Anchors Love, PA-C  senna (SENOKOT) 8.6 MG TABS tablet Take 2 tablets (17.2 mg total) by mouth at bedtime. 10/13/13  Yes Orson Eva, MD  traMADol (ULTRAM) 50 MG tablet Take 1 tablet (50 mg  total) by mouth every 6 (six) hours as needed for moderate pain. 09/24/13  Yes Ivan Anchors Love, PA-C  traZODone (DESYREL) 50 MG tablet Take 0.5-1 tablets (25-50 mg total) by mouth at bedtime as needed for sleep. 09/24/13  Yes Bary Leriche, PA-C   Physical Exam: Blood pressure 136/89, pulse 69, temperature 98.3 F (36.8 C), temperature source Axillary, resp. rate 19, height 5\' 9"  (1.753 m), weight 70 kg (154 lb 5.2 oz), SpO2 94.00%. Filed Vitals:   11/05/13 1520  BP:  Pulse:   Temp: 98.3 F (36.8 C)  Resp:    General:  Appears calm and comfortable, lethargic, answering some questions.  Eyes: PERRL, normal lids, irises & conjunctiva ENT: grossly normal hearing, lips & tongue Neck: no LAD, masses or thyromegaly Cardiovascular: RRR, no m/r/g. No LE edema. Respiratory: CTA bilaterally, no w/r/r. Normal respiratory effort. Abdomen: soft, ntnd Skin: Left AKA with clean dressing. Decubitus ulcer not evaluated.  Psychiatric: Flat affect.  Neurologic: he is lethargic, wake up to answer questions. Motor strength right side 5/5, left side with chronic weakness.     Labs on Admission:  Basic Metabolic Panel:  Recent Labs Lab 10/29/13 1735 10/30/13 0435 10/31/13 0500 11/01/13 0030 11/01/13 0545 11/05/13 0500  NA  --  130* 134* 136*  --   --   K  --  4.1 4.1 4.2  --   --   CL  --  91* 96 98  --   --   CO2  --  26 25 25   --   --   GLUCOSE  --  126* 87 93  --   --   BUN  --  9 10 10   --   --   CREATININE 0.65 0.86 1.10 1.09  --  1.04  CALCIUM  --  8.3* 8.3* 8.1*  --   --   MG  --   --   --   --  2.2  --    Liver Function Tests: No results found for this basename: AST, ALT, ALKPHOS, BILITOT, PROT, ALBUMIN,  in the last 168 hours No results found for this basename: LIPASE, AMYLASE,  in the last 168 hours No results found for this basename: AMMONIA,  in the last 168 hours CBC:  Recent Labs Lab 10/29/13 1735 10/30/13 0435 10/31/13 0500 11/01/13 0545  WBC 19.0* 18.3* 19.9* 17.6*    HGB 9.2* 9.5* 9.7* 9.1*  HCT 28.9* 30.7* 31.2* 29.8*  MCV 83.5 84.8 85.2 85.9  PLT 581* 589* 571* 572*   Cardiac Enzymes: No results found for this basename: CKTOTAL, CKMB, CKMBINDEX, TROPONINI,  in the last 168 hours BNP: No components found with this basename: POCBNP,  CBG:  Recent Labs Lab 11/04/13 1151 11/04/13 1554 11/04/13 2201 11/05/13 0756 11/05/13 1147  GLUCAP 128* 95 105* 92 142*    Radiological Exams on Admission: No results found.  Time spent: 75 minutes.   Niel Hummer A [facility] Pager [phone]  If 7PM-7AM, please contact night-coverage [email or web address] Password [email or web address] 11/05/2013, 3:24 PM

## 2013-11-05 NOTE — Consult Note (Signed)
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  Complicated pain and depression with prolonged hospital stay and complications of multiple medical commodities.    1. Pain- Mixed nociceptive and neuropathic pain.  May also have psychosocial component as well given that he is having trouble adjsuting to long hospital stay and setbacks.  Pain from sacral wound and AKA site. Neuropathic component especially present at AKI site. Certainly worse after debridement.  Complicated by some drowsiness with pain medications.  Has been on Oxycontin 40mg  BID (previously 20mg  BID) for at least >1 month per records.   - Continue oxycontin 40mg  BID - Will increase oxycodone to up to 2 tabs q4h PRN.  (10mg  dose likely what he will require given his 80mg /day oxycontin long acting) - Can still use PRN morphine if needed, but encouraged him to use PRN percocet first - Will increase cymbalta to 60mg  for neuropathic pani (has been on several weeks at least) - Will increase gabapentin to 600mg  TID -continued wound care for sacral ulcer   2. Depression- Reports that this is mostly related to feeling poorly and not doing as well in hospital as he hoped. Also alludes to struggling with some loss of independence at home prior to hospital stay.  More reliant on his wife.  Certainly loss of control as well. Will continue to follow along and support him. May benefit from being more involved in medical decision making as well. Will increase cymbalta as above.  If  continues to do poorly, has seen psych in past and may be reasonable to re-consult them as well.   3. Constipation- likely opioid related. Will start on bowel regimenw it doc/senna and PRN miralzx  4. Nausea- Prn zofran.  Maalox PRN.  Relates to having to take so many pills. Not specific to morphine but can consider opioid rotation as well. Encourage good oral hygiene.   5. Psychosocial: Met with his wife and sister-in law today. Married >36yrs. Formerly worked at [employer].  Has no children, but pets at hoe  6. Spiritual: Strong christian faith and receives almost daily visits from his home pastor.  Strong support from his church  7. ACP- Full code, no documentation that I see on file.  Will try to have at least basic discussions during hospital stay.   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  Also has history of metastatic colon CA dx 2012 s/p chemotherapy and microwave ablation to solitary liver met.  His primary Oncologist is Dr Alen Blew and recent imaging has shown stable disease per last Onc visit with plan for surveillance monitoring.    With VRE infection he was also noted to have paroxysmal Afib for which cardiology was consulted. Medicine was also consulted 2/2 difficulty swallowing he  has had.  Today Mr Sappenfield was a bit sleepy after receiving several doses of IV morphine for acute leg pain after woud debridement. His wife reports that he was receiving morphine 2mg  IV with little effect, but after few doses of 4mg  became a bit more sleepy. He is able to awaken and appropriately interact with me this afternoon though. Reports that pain is in 2 major areas.  His sacrum and AKA site are the worst. Constant pains with a burning/throbbing sensation.  Certainly worse  with wound debridement.  Has some relief from pain medications but still difficult to control.  Was on oxycontin and oxycodone as outpatient per wife, but she is unsure of doses.  It appears during recent hospital stay, oxycontin was increased from 20mg  BID to 40mg  BID.  No recent changes in cymbalta or neurontin doses. Undergoing hydrotherapy for sacral ulcer. Occaisonal nausea from having to take so many pills as well, but does not think that IV morphine is contributing to his nausea.  Having more difficulty moving bowels. Usually moves them once per day.  Feeling down given long hospital stay, loss of independence, setbacks.  Wants to get better. Appetite has been poor and has some difficulty swallowing with plan for swallow eval while here. Complains of very dry mouth.     RXV:QMGQ ROS negative unless otherwise mentioned above.     PMH:  Past Medical History  Diagnosis Date  . GERD (gastroesophageal reflux disease)   . Hyperlipidemia   . Hypertension   . Peripheral vascular disease   . Internal and external bleeding hemorrhoids   . History of gout     left great toe  . Depression   . Numbness in right leg     Hx: of diabetes  . Coronary artery disease     RCA occlusion with good collaterals, o/w no obs CAD 12/2011  . Pneumonia ~ 1977  . Type II diabetes mellitus   . History of blood transfusion 12/2011; 12/2012    "before OR; after OR" (01/22/2013)  . Stroke 2008    "left side weak; unable to move left hand still" (01/22/2013)  . Rectal cancer dx'd 08/2007    S/P chemo, radiation, biopsies  . Metastases to the liver dx'd 08/2010     PSH: Past Surgical History  Procedure Laterality Date  . Carotid endarterectomy Right 2008  . Liver cryoablation  08/2010    "chemo shrunk the cancer 50% then they went in and burned the rest of it"  . Axillary-femoral bypass graft  01/29/2012    Procedure: BYPASS GRAFT AXILLA-BIFEMORAL;  Surgeon: Elam Dutch, MD;  Location: [facility] OR;  Service:  Vascular;  Laterality: N/A;  Left axilla-bifemoral bypass using gore-tex graft.   . Colonoscopy w/ biopsies and polypectomy      Hx: of  . Femoral-popliteal bypass graft Left 01/06/2013    Procedure: Femoral-Popliteal Artery Bypass;  Surgeon: Elam Dutch, MD;  Location: [place];  Service: Vascular;  Laterality: Left;  . Intraoperative arteriogram Left 01/06/2013    Procedure: INTRA OPERATIVE ARTERIOGRAM;  Surgeon: Elam Dutch, MD;  Location: [place];  Service: Vascular;  Laterality: Left;  . Femoral-popliteal bypass graft Left 01/10/2013    Procedure: THROMBECTOMY POSSIBLE BYPASS GRAFT FEMORAL-POPLITEAL ARTERY;  Surgeon: Mal Misty, MD;  Location: [place];  Service: Vascular;  Laterality: Left;  Attempted Thrombectomy of left Femoral/Popliteal graft - Unsuccessful.  Insertion of Left femoral to below the knee popliteal propaten graft.  . Mass biopsy  2009    "found mass that was cancer; later had chemo and radiation; never cut mass out" (01/22/2013)  . Femoral-popliteal bypass graft Left 09/05/2013    Procedure: Thrombectomy of Left Femoral-Below Knee Popliteal Bypass Graft; Patch Angioplasty of Distal Portion of Bypass Graft;  Surgeon: Angelia Mould, MD;  Location: [place];  Service: Vascular;  Laterality: Left;  . Patch angioplasty Left 09/05/2013    Procedure: Patch Angioplasty Distal Portion of Left Femoral-Below Knee Popliteal Bypass Graft;  Surgeon: Angelia Mould, MD;  Location: [place];  Service: Vascular;  Laterality: Left;  . Intraoperative arteriogram Left 09/05/2013    Procedure: INTRA OPERATIVE ARTERIOGRAM;  Surgeon: Angelia Mould, MD;  Location: [place];  Service: Vascular;  Laterality: Left;  . Fasciotomy Left 09/05/2013    Procedure: FASCIOTOMY;  Surgeon: Angelia Mould, MD;  Location: [place];  Service: Vascular;  Laterality: Left;  Marland Kitchen Vascular surgery    . Fasciotomy closure Left 09/11/2013    Procedure: CLOSURE OF FASCIOTOMY SITES LEFT LOWER EXTREMITY AND WOUND  VAC APPLICATION;  Surgeon: Angelia Mould, MD;  Location: [place];  Service: Vascular;  Laterality: Left;  . Amputation Left 10/08/2013    Procedure: LEFT ABOVE KNEE AMPUTATION;  Surgeon: Serafina Mitchell, MD;  Location: [place];  Service: Vascular;  Laterality: Left;  . I&d extremity Left 10/29/2013    Procedure: IRRIGATION AND DEBRIDEMENT LEFT ABOVE KNEE AMPUTATION;  Surgeon: Angelia Mould, MD;  Location: [place];  Service: Vascular;  Laterality: Left;  . Amputation Left 10/29/2013    Procedure: REVISION AMPUTATION ABOVE KNEE;  Surgeon: Angelia Mould, MD;  Location: [facility] OR;  Service: Vascular;  Laterality: Left;   I have reviewed the [facility] and SH and  If appropriate update it with new information. Allergies  Allergen Reactions  . Other     Became hypotensive. Pt states allergic to "muscle relaxer" but unsure of name.  . Tape Hives and Itching    medfix non woven tape   Scheduled Meds: . ALPRAZolam  0.25 mg Oral BID  . ALPRAZolam  0.5 mg Oral QHS  . cefTRIAXone (ROCEPHIN)  IV  2 g Intravenous Q24H  . collagenase   Topical Daily  . DAPTOmycin (CUBICIN)  IV  6 mg/kg Intravenous Q24H  . dipyridamole-aspirin  1 capsule Oral BID  . [START ON 11/06/2013] DULoxetine  60 mg Oral Daily  . enoxaparin (LOVENOX) injection  40 mg Subcutaneous Q24H  . feeding supplement (ENSURE COMPLETE)  237 mL Oral TID BM  . ferrous fumarate  1 tablet Oral BID  . ferrous sulfate  325 mg Oral BID WC  . gabapentin  600 mg Oral TID  . insulin aspart  0-9 Units Subcutaneous TID WC  . lisinopril  2.5 mg Oral Daily  . metoprolol tartrate  50 mg Oral BID  . multivitamin with minerals  1 tablet Oral Daily  . omega-3 acid ethyl esters  2 g Oral BID  . OxyCODONE  40 mg Oral Q12H  . pantoprazole  40 mg Oral Daily  . saccharomyces boulardii  250 mg Oral BID  . senna-docusate  2 tablet Oral BID  . vitamin C  500 mg Oral Daily   Continuous Infusions: . sodium chloride 50 mL/hr at 11/05/13 1200   PRN  Meds:.alum & mag hydroxide-simeth, guaiFENesin-dextromethorphan, hydrALAZINE, metoprolol, metoprolol tartrate, morphine injection, ondansetron, oxyCODONE-acetaminophen, phenol, polyethylene glycol, traZODone    BP 136/89  Pulse 69  Temp(Src) 98.3 F (36.8 C) (Axillary)  Resp 19  Ht 5\' 9"  (1.753 m)  Wt 70 kg (154 lb 5.2 oz)  BMI 22.78 kg/m2  SpO2 94%   PPS: 50   Intake/Output Summary (Last 24 hours) at 11/05/13 1735 Last data filed at 11/05/13 1600  Gross per 24 hour  Intake   1150 ml  Output   1275 ml  Net   -125 ml   LBM: few days ago  Physical Exam:  General: Alert, NAD HEENT:  [place], dry mm, sclera anciteric Chest:  CTAB, symm expansion CVS: RRR Abdomen:soft, NT, ND Ext:  No c/c/e Neuro: moves all extremities, follows commands appropriately MSK: L AKA with c/d/i dressing.  Labs: CBC    Component Value Date/Time   WBC 17.6* 11/01/2013 0545   WBC 7.1 06/05/2013 1010   RBC 3.47* 11/01/2013 0545   RBC 4.23 06/05/2013 1010   RBC 3.54* 01/26/2012 0455   HGB 9.1* 11/01/2013 0545   HGB 9.3* 06/05/2013 1010   HCT 29.8* 11/01/2013 0545   HCT 30.8* 06/05/2013 1010   PLT 572* 11/01/2013 0545   PLT 324 06/05/2013 1010   MCV 85.9 11/01/2013 0545   MCV 72.9* 06/05/2013 1010   MCH 26.2 11/01/2013 0545   MCH 22.0* 06/05/2013 1010   MCHC 30.5 11/01/2013 0545   MCHC 30.2* 06/05/2013 1010   RDW 20.1* 11/01/2013 0545   RDW 16.9* 06/05/2013 1010   LYMPHSABS 1.0 10/28/2013 0500   LYMPHSABS 1.6 06/05/2013 1010   MONOABS 1.2* 10/28/2013 0500   MONOABS 0.5 06/05/2013 1010   EOSABS 0.1 10/28/2013 0500   EOSABS 0.1 06/05/2013 1010   BASOSABS 0.0 10/28/2013 0500   BASOSABS 0.1 06/05/2013 1010    BMET    Component Value Date/Time   NA 136* 11/01/2013 0030   NA 137 06/05/2013 1010   NA 147* 10/12/2011 0959   K 4.2 11/01/2013 0030   K 5.4* 06/05/2013 1010   K 5.2* 10/12/2011 0959   CL 98 11/01/2013 0030   CL 102 05/19/2012 1427   CL 102 10/12/2011 0959   CO2 25 11/01/2013 0030   CO2 23 06/05/2013 1010   CO2 29 10/12/2011 0959    GLUCOSE 93 11/01/2013 0030   GLUCOSE 239* 06/05/2013 1010   GLUCOSE 205* 05/19/2012 1427   GLUCOSE 244* 10/12/2011 0959   BUN 10 11/01/2013 0030   BUN 19.5 06/05/2013 1010   BUN 13 10/12/2011 0959   CREATININE 1.04 11/05/2013 0500   CREATININE 1.4* 06/05/2013 1010   CREATININE 1.0 10/12/2011 0959   CALCIUM 8.1* 11/01/2013 0030   CALCIUM 10.1 06/05/2013 1010   CALCIUM 9.0 10/12/2011 0959   GFRNONAA 71* 11/05/2013 0500   GFRAA 83* 11/05/2013 0500    CMP     Component Value Date/Time   NA 136* 11/01/2013 0030   NA 137 06/05/2013 1010   NA 147* 10/12/2011 0959   K 4.2 11/01/2013 0030   K 5.4* 06/05/2013 1010   K 5.2* 10/12/2011 0959   CL 98 11/01/2013 0030   CL 102 05/19/2012 1427   CL 102 10/12/2011 0959   CO2 25 11/01/2013 0030   CO2 23 06/05/2013 1010   CO2 29 10/12/2011 0959   GLUCOSE 93 11/01/2013 0030   GLUCOSE 239* 06/05/2013 1010   GLUCOSE 205* 05/19/2012 1427   GLUCOSE 244* 10/12/2011 0959   BUN 10 11/01/2013 0030   BUN 19.5 06/05/2013 1010   BUN 13 10/12/2011 0959   CREATININE 1.04 11/05/2013 0500   CREATININE 1.4* 06/05/2013 1010   CREATININE 1.0 10/12/2011 0959   CALCIUM  8.1* 11/01/2013 0030   CALCIUM 10.1 06/05/2013 1010   CALCIUM 9.0 10/12/2011 0959   PROT 5.9* 10/14/2013 0605   PROT 7.9 06/05/2013 1010   PROT 7.6 10/12/2011 0959   ALBUMIN 2.1* 10/14/2013 0605   ALBUMIN 3.9 06/05/2013 1010   AST 35 10/14/2013 0605   AST 32 06/05/2013 1010   AST 51* 10/12/2011 0959   ALT 13 10/14/2013 0605   ALT 26 06/05/2013 1010   ALT 45 10/12/2011 0959   ALKPHOS 83 10/14/2013 0605   ALKPHOS 112 06/05/2013 1010   ALKPHOS 90* 10/12/2011 0959   BILITOT 0.4 10/14/2013 0605   BILITOT 0.37 06/05/2013 1010   BILITOT 0.50 10/12/2011 0959   GFRNONAA 71* 11/05/2013 0500   GFRAA 83* 11/05/2013 0500       Time In Time Out Total Time Spent with Patient Total Overall Time  410 530 60 minutes 80 minutes    Greater than 50%  of this time was spent counseling and coordinating care related to the above assessment and plan.   Doran Clay  D.O. Palliative Medicine Team at [facility]  Team Phone: [phone]

## 2013-11-05 NOTE — Progress Notes (Signed)
Utilization review completed.  

## 2013-11-05 NOTE — Progress Notes (Signed)
Occupational Therapy Treatment Patient Details Name: Donald [name] MRN: [id number] DOB: 1945-05-22 Today's Date: 11/05/2013    History of present illness 68 yo male s/p Lt aka readmission for CIR for I&D and new sacral wound. HX May 2015 for Lt femoral-popliteal bypass. pt has hx of Lt side affect CVA, Lt aka 10/09/13 rectal CA (2009) with metastases in liver(2012) , HTN, gout, PVD, Ferd, CAD, and DM2    OT comments  Pt very agreeable to any activity requested of him today stating, "I'll try." Focus of OT on grooming and bed mobility, while PT addressed R LE strengthening and weight bearing in a semi-standing position with pt's foot supported by the footboard.  Pt much more comfortable avoiding pressure to his buttocks.  Follow Up Recommendations  SNF    Equipment Recommendations       Recommendations for Other Services      Precautions / Restrictions Precautions Precautions: Fall Precaution Comments: LT AKA wrap flexiseal foley Restrictions Weight Bearing Restrictions: No       Mobility Bed Mobility Overal bed mobility: Needs Assistance Bed Mobility: Rolling Rolling: Min assist;Mod assist (min to R , mod to L with rail)            Transfers                      Balance                                   ADL Overall ADL's : Needs assistance/impaired     Grooming: Wash/dry face;Oral care;Set up;Minimal assistance;Bed level (shave, with bed in semi standing position)                                 General ADL Comments: Pt alternating R LE exercise in semi standing with foot supported by footboard with grooming activities. Used maxislide under hips/trunk.  Pt assisting with rolling using R UE on rail for positioning.      Vision                     Perception     Praxis      Cognition   Behavior During Therapy: [facility] for tasks assessed/performed Overall Cognitive Status: Within Functional Limits for tasks  assessed                       Extremity/Trunk Assessment               Exercises Amputee Exercises Quad Sets: AROM;Right;10 reps Other Exercises Other Exercises: 22 minutes of graded weight bearing through RLE (standing on footboard bed tilted to 45 degrees)  Other Exercises: mini squats performed in graded weight bearing at tilted 45 degrees   Shoulder Instructions       General Comments      Pertinent Vitals/ Pain       02 93% on RA for entire session, RN notified, pain in buttocks and R residual limb, repositioned, RN aware  Home Living                                          Prior Functioning/Environment  Frequency Min 2X/week     Progress Toward Goals  OT Goals(current goals can now be found in the care plan section)  Progress towards OT goals: Progressing toward goals  Acute Rehab OT Goals Patient Stated Goal: to get back  home to cats  Plan Discharge plan remains appropriate    Co-evaluation    PT/OT/SLP Co-Evaluation/Treatment: Yes Reason for Co-Treatment: Complexity of the patient's impairments (multi-system involvement);For patient/therapist safety   OT goals addressed during session: ADL's and self-care      End of Session     Activity Tolerance Patient tolerated treatment well   Patient Left in bed;with call bell/phone within reach   Nurse Communication  (grooming completed)        Time: 0600-4599 OT Time Calculation (min): 29 min  Charges: OT General Charges $OT Visit: 1 Procedure OT Treatments $Self Care/Home Management : 8-22 mins  Malka So 11/05/2013, 10:15 AM [phone]

## 2013-11-05 NOTE — Consult Note (Signed)
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  Wound bed:  Left buttock is improving but is full thickness; Stage III pressure ulcer with associated MASD (moisture associated skin damage) related to incontinence.  85% yellow fibrin with 15% epithelial buds throughout the wound bed and at wound edges Upper gluteal cleft: 90% yellow with 10% epithelial buds throughout the wound base. Drainage (amount, consistency, odor) moderate, serous.  Periwound:intact Dressing procedure/placement/frequency: Attempting to use Medihoney (manuka honey) for the larger buttock wound to speed up debridement process, however due to the patient incontinence of bowel it is making if difficult to maintain this dressing for the amount of time needed to see significant difference.  I have decreased frequency of the ultrasonic mist therapy due to patient's discomfort.  Pt refuses LALM at this time, he was not comfortable in the bed with air mattress and was very hot in the bed.  He has been trying to turn from side to side to relieve the pressure from the area.   WOC will re evaluate the patient on Friday with the PT again  [facility] RN,CWOCN [phone]

## 2013-11-05 NOTE — Progress Notes (Addendum)
  -  The patient has had swallowing issue.  He chews his food and spits it out.  I ordered a speech consult.   - I am also ordering an Infectious disease consult for help with his  VANCOMYCIN RESISTANT ENTEROCOCCUS ISOLATED   -We will also consult the internal medicine service to help manage his multiple medical problems.  -  -We will be consulting Palliative care for help with pain management and depression s/p Left fem-pop failure and now with AKA.   COLLINS, EMMA MAUREEN PA-C  I agree with the above.  I changed his dressing today and sharply debrided some necrotic tissue.  The AKA is still very tenuous.  Will continue with wound care.  If this cleans up a little more. I will try a wound vac.  If not, he will need to beevaluated for a hip disarticulation  WElls Brabham

## 2013-11-06 ENCOUNTER — Inpatient Hospital Stay (HOSPITAL_COMMUNITY): Payer: Medicare Other

## 2013-11-06 DIAGNOSIS — R52 Pain, unspecified: Secondary | ICD-10-CM

## 2013-11-06 LAB — GLUCOSE, CAPILLARY
GLUCOSE-CAPILLARY: 96 mg/dL (ref 70–99)
GLUCOSE-CAPILLARY: 117 mg/dL — AB (ref 70–99)
Glucose-Capillary: 92 mg/dL (ref 70–99)
Glucose-Capillary: 150 mg/dL — ABNORMAL HIGH (ref 70–99)
Glucose-Capillary: 153 mg/dL — ABNORMAL HIGH (ref 70–99)

## 2013-11-06 LAB — HEPATITIS PANEL, ACUTE
HCV AB: NEGATIVE
Hep A IgM: NONREACTIVE
Hep B C IgM: NONREACTIVE
Hepatitis B Surface Ag: NEGATIVE

## 2013-11-06 LAB — CBC
HCT: 28.7 % — ABNORMAL LOW (ref 39.0–52.0)
Hemoglobin: 8.9 g/dL — ABNORMAL LOW (ref 13.0–17.0)
MCH: 26.6 pg (ref 26.0–34.0)
MCHC: 31 g/dL (ref 30.0–36.0)
MCV: 85.9 fL (ref 78.0–100.0)
PLATELETS: 496 10*3/uL — AB (ref 150–400)
RBC: 3.34 MIL/uL — ABNORMAL LOW (ref 4.22–5.81)
RDW: 19.7 % — AB (ref 11.5–15.5)
WBC: 10.4 10*3/uL (ref 4.0–10.5)

## 2013-11-06 LAB — CK: Total CK: 104 U/L (ref 7–232)

## 2013-11-06 LAB — SEDIMENTATION RATE: SED RATE: 88 mm/h — AB (ref 0–16)

## 2013-11-06 LAB — HIV ANTIBODY (ROUTINE TESTING W REFLEX): HIV 1&2 Ab, 4th Generation: NONREACTIVE

## 2013-11-06 LAB — C-REACTIVE PROTEIN: CRP: 12.1 mg/dL — ABNORMAL HIGH (ref <0.60)

## 2013-11-06 MED ORDER — DRONABINOL 2.5 MG PO CAPS
2.5000 mg | ORAL_CAPSULE | Freq: Two times a day (BID) | ORAL | Status: DC
  Administered 2013-11-07 – 2013-11-08 (×3): 2.5 mg via ORAL
  Filled 2013-11-06 (×3): qty 1

## 2013-11-06 MED ORDER — SODIUM CHLORIDE 0.9 % IV SOLN
500.0000 mg | INTRAVENOUS | Status: DC
  Administered 2013-11-06 – 2013-11-14 (×9): 500 mg via INTRAVENOUS
  Filled 2013-11-06 (×17): qty 10

## 2013-11-06 NOTE — Progress Notes (Signed)
[name]  Day #2 daptomycin and ceftriaxone   Subjective: No new complaints   Antibiotics:  Anti-infectives   Start     Dose/Rate Route Frequency Ordered Stop   11/06/13 1700  DAPTOmycin (CUBICIN) 500 mg in sodium chloride 0.9 % IVPB     500 mg 220 mL/hr over 30 Minutes Intravenous Every 24 hours 11/06/13 0924     11/05/13 1700  DAPTOmycin (CUBICIN) 420 mg in sodium chloride 0.9 % IVPB  Status:  Discontinued     6 mg/kg  70 kg 216.8 mL/hr over 30 Minutes Intravenous Every 24 hours 11/05/13 1602 11/06/13 0924   11/05/13 1630  cefTRIAXone (ROCEPHIN) 2 g in dextrose 5 % 50 mL IVPB     2 g 100 mL/hr over 30 Minutes Intravenous Every 24 hours 11/05/13 1518     11/05/13 1530  linezolid (ZYVOX) tablet 600 mg  Status:  Discontinued     600 mg Oral Every 12 hours 11/05/13 1518 11/05/13 1521   11/02/13 1200  ciprofloxacin (CIPRO) tablet 500 mg  Status:  Discontinued     500 mg Oral Every 12 hours 11/02/13 0939 11/05/13 1518   11/02/13 0800  ciprofloxacin (CIPRO) tablet 500 mg  Status:  Discontinued     500 mg Oral 2 times daily 11/02/13 0734 11/02/13 0939   10/29/13 2200  piperacillin-tazobactam (ZOSYN) IVPB 3.375 g  Status:  Discontinued     3.375 g 12.5 mL/hr over 240 Minutes Intravenous Every 8 hours 10/29/13 1507 11/02/13 0734   10/29/13 1630  vancomycin (VANCOCIN) IVPB 1000 mg/200 mL premix  Status:  Discontinued     1,000 mg 200 mL/hr over 60 Minutes Intravenous Every 12 hours 10/29/13 1512 11/02/13 0734      Medications: Scheduled Meds: . ALPRAZolam  0.25 mg Oral BID  . ALPRAZolam  0.5 mg Oral QHS  . cefTRIAXone (ROCEPHIN)  IV  2 g Intravenous Q24H  . collagenase   Topical Daily  . DAPTOmycin (CUBICIN)  IV  500 mg Intravenous Q24H  . dipyridamole-aspirin  1 capsule Oral BID  . dronabinol  2.5 mg Oral BID AC  . DULoxetine  60 mg Oral Daily  . enoxaparin (LOVENOX) injection  40 mg Subcutaneous Q24H  . feeding supplement (ENSURE COMPLETE)   237 mL Oral TID BM  . ferrous fumarate  1 tablet Oral BID  . ferrous sulfate  325 mg Oral BID WC  . gabapentin  600 mg Oral TID  . insulin aspart  0-9 Units Subcutaneous TID WC  . lisinopril  2.5 mg Oral Daily  . metoprolol tartrate  50 mg Oral BID  . multivitamin with minerals  1 tablet Oral Daily  . omega-3 acid ethyl esters  2 g Oral BID  . OxyCODONE  40 mg Oral Q12H  . pantoprazole  40 mg Oral Daily  . saccharomyces boulardii  250 mg Oral BID  . senna-docusate  2 tablet Oral BID  . vitamin C  500 mg Oral Daily   Continuous Infusions: . sodium chloride 50 mL/hr at 11/06/13 0418   PRN Meds:.alum & mag hydroxide-simeth, guaiFENesin-dextromethorphan, hydrALAZINE, metoprolol, metoprolol tartrate, morphine injection, ondansetron, oxyCODONE-acetaminophen, phenol, polyethylene glycol, traZODone    Objective: Weight change:   Intake/Output Summary (Last 24 hours) at 11/06/13 1710 Last data filed at 11/06/13 1600  Gross per 24 hour  Intake   1270 ml  Output   2200 ml  Net   -930 ml   Blood pressure 169/65, pulse 77, temperature  97.4 F (36.3 C), temperature source Oral, resp. rate 14, height 5\' 9"  (1.753 m), weight 154 lb 5.2 oz (70 kg), SpO2 93.00%. Temp:  [97.4 F (36.3 C)-99.8 F (37.7 C)] 97.4 F (36.3 C) (07/10 1507) Pulse Rate:  [71-82] 77 (07/10 1507) Resp:  [9-23] 14 (07/10 1507) BP: (107-178)/(44-88) 169/65 mmHg (07/10 1507) SpO2:  [93 %-95 %] 93 % (07/10 1507)  Physical Exam: General: Alert and awake, oriented x3, not in any acute distress.  HEENT: anicteric sclera, pupils reactive to light and accommodation, EOMI, oropharynx clear and without exudate  CVS regular rate, normal r, no murmur rubs or gallops  Chest: clear to auscultation bilaterally, no wheezing, rales or rhonchi  Abdomen: soft nontender, nondistended, normal bowel sounds,  Extremities: AKA site dressed  Skin: Stage III decubitus ulcer not examined  Neuro: left sided weakness   CBC:  Recent  Labs Lab 10/31/13 0500 11/01/13 0545 11/05/13 1720 11/06/13 0415  HGB 9.7* 9.1* 9.1* 8.9*  HCT 31.2* 29.8* 30.1* 28.7*  PLT 571* 572* 512* 496*     BMET  Recent Labs  11/05/13 0500 11/05/13 1720  NA  --  137  K  --  4.0  CL  --  97  CO2  --  26  GLUCOSE  --  74  BUN  --  9  CREATININE 1.04 0.99  CALCIUM  --  8.5     Liver Panel  No results found for this basename: PROT, ALBUMIN, AST, ALT, ALKPHOS, BILITOT, BILIDIR, IBILI,  in the last 72 hours     Sedimentation Rate  Recent Labs  11/06/13 0415  ESRSEDRATE 88*   C-Reactive Protein  Recent Labs  11/06/13 0415  CRP 12.1*    Micro Results: Recent Results (from the past 720 hour(s))  SURGICAL PCR SCREEN     Status: None   Collection Time    10/08/13 12:12 AM      Result Value Ref Range Status   MRSA, PCR NEGATIVE  NEGATIVE Final   Staphylococcus aureus NEGATIVE  NEGATIVE Final   Comment:            The Xpert SA Assay (FDA     approved for NASAL specimens     in patients over 19 years of age),     is one component of     a comprehensive surveillance     program.  Test performance has     been validated by [employer] for patients greater     than or equal to 36 year old.     It is not intended     to diagnose infection nor to     guide or monitor treatment.  URINE CULTURE     Status: None   Collection Time    10/27/13  5:13 PM      Result Value Ref Range Status   Specimen Description URINE, CLEAN CATCH   Final   Special Requests NONE   Final   Culture  Setup Time     Final   Value: 10/27/2013 19:13     Performed at [facility]     Final   Value: >=100,000 COLONIES/ML     Performed at [facility]   Culture     Final   Value: Multiple bacterial morphotypes present, none predominant. Suggest appropriate recollection if clinically indicated.     Performed at [facility]   Report Status 10/28/2013 FINAL  Final  WOUND CULTURE     Status: None    Collection Time    10/29/13 10:25 AM      Result Value Ref Range Status   Specimen Description WOUND LEFT LEG   Final   Special Requests LEFT ABOVE KNEE PT ON VANCO ZOSYN   Final   Gram Stain     Final   Value: ABUNDANT WBC PRESENT, PREDOMINANTLY PMN     NO SQUAMOUS EPITHELIAL CELLS SEEN     MODERATE GRAM POSITIVE COCCI     IN PAIRS FEW GRAM NEGATIVE RODS     Performed at [facility]   Culture     Final   Value: ABUNDANT ESCHERICHIA COLI     MODERATE VANCOMYCIN RESISTANT ENTEROCOCCUS ISOLATED     Note: CRITICAL RESULT CALLED TO, READ BACK BY AND VERIFIED WITH: TRACIE DAVIS@0811  ON [id number] BY [facility]     Performed at [facility]   Report Status 11/02/2013 FINAL   Final   Organism ID, Bacteria ESCHERICHIA COLI   Final   Organism ID, Bacteria VANCOMYCIN RESISTANT ENTEROCOCCUS ISOLATED   Final  ANAEROBIC CULTURE     Status: None   Collection Time    10/29/13 10:25 AM      Result Value Ref Range Status   Specimen Description WOUND LEFT LEG   Final   Special Requests LEFT ABOVE KNEE PT ON VANCO ZOSYN   Final   Gram Stain     Final   Value: MODERATE WBC PRESENT, PREDOMINANTLY PMN     NO SQUAMOUS EPITHELIAL CELLS SEEN     MODERATE GRAM POSITIVE COCCI     IN PAIRS FEW GRAM NEGATIVE RODS     Performed at [facility]   Culture     Final   Value: NO ANAEROBES ISOLATED     Performed at [facility]   Report Status 11/03/2013 FINAL   Final    Studies/Results: Dg Esophagus  11/06/2013   EXAM: ESOPHOGRAM/BARIUM SWALLOW  TECHNIQUE: Single contrast examination was performed using thin barium or water soluble.  FLUOROSCOPY TIME:  Dictate in minutes and seconds  COMPARISON:  None.  FINDINGS: Study was limited by patient positioning. Imaging was performed with the patient at about 10-15 degrees head up and in both posterior oblique positions.  With swallows of thin barium, there is no evidence for esophageal mass, diverticulum, or gross stricture. There is lack of  primary peristalsis on multiple swallows.  Patient was given a 13 mm barium tablet with water. He had considerable difficulty moving the tablet from the hypopharynx into the esophagus requiring multiple repeat swallows of water. Eventually, with an additional swallow of barium, the tablet was moved into the esophagus and traveled as far as the distal esophagus, but did not pass into the stomach. Given his immobility and inability to stand upright, this tablet is likely static at this position due to lack of strong primary peristaltic stripping wave and positioning rather than a true esophageal stricture.  IMPRESSION: No evidence for gross esophageal mass lesion or diverticulum. No definite esophageal stricture.  Barium tablet becomes static in the distal esophagus most likely as a consequence of interrupted peristalsis and supine positioning since there was no evidence of stricture on the opacified images of the distal esophagus.   Electronically Signed   By: Misty Stanley M.D.   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  --Plan on treating with 8 weeks of IV antibiotics. Note to be no effective oral antibiotics I can go to that would cover the VRE long term. Zyvox is a viable oral option but only for short time inevitably the patient would develop thrombocytopenia. Since he has had extensive amputation hopefully those specific  organisms will not be necessarily the problem. I am worried though that he will ultimately require a hip disarticulation is Dr. Scot Dock as described.   He should have weekly cbc, bmp and cpk faxed to my attention at [phone]  #2 stage III decubitus ulcer at present the AKA site is the large problem. HE is certainly broadly covered should there be any smoldering infection, needs continued wound care.   #3 screeningHIV and hepatitis panel are negative  I will arrange for HSFU for him in my clinic in the next 3 weeks. Again he should be on IV antibiotics for at least 8 weeks, UNLESS he goes for more definitive treatment with hip dysarticulation  Please call with further questions.    LOS: 8 days   Alcide Evener 11/06/2013, 5:10 PM

## 2013-11-06 NOTE — Progress Notes (Addendum)
Physical Therapy Wound Treatment and Discharge Patient Details  Name: Donald [name] MRN: [id number] Date of Birth: 08-30-1945  Today's Date: 11/06/2013 Time: 6301-6010 Time Calculation (min): 52 min  Subjective  Subjective: Continues to refuse air mattress "too hot.Marland KitchenMarland KitchenI sweat too much" Max encouragement and explanation re: benefits and preventing Rt hip breakdown. Patient and Family Stated Goals: decr pain and heal wound Prior Treatments: "every kind of cream you can imagine"  Pain Score: Pain Score: 7/10 pre- and post-treatment; pre-medicated for pain  Wound Assessment  Clinical Statement: Reassessed with Melody, RN, WOC. Again distal wound soiled with bowel movement under dressing. Medihoney from Wed no longer present. Periwound much better and able to separate dressings (proximal from distal wounds). Distal wound continues at high risk for contamination from continued bowel incontinence (pt cleaned up x 3 during session). Agree with Melody that hydrotherapy can be d/c'd at this time. Discussed with Melody, RN, WOC benefit of an ICU bed for this pt. It has a built in air mattress, however it also has a footboard that allows weight-bearing up to 250 lbs in reverse trendelenberg position, which would be ideal for mobility training/strengthening. Please consider ordering an ICU bed for this patient.  Wound / Incision (Open or Dehisced) 10/13/13 Other (Comment) Buttocks Left;Distal MSDA (Active)  Dressing Type Other (Comment);ABD;Barrier Film (skin prep);Gauze (Comment);Tape dressing 11/06/2013  9:42 AM  Dressing Changed Changed 11/06/2013  9:42 AM  Dressing Status Clean;Dry;Intact 11/06/2013  9:42 AM  Dressing Change Frequency Monday, Wednesday, Friday 11/06/2013  9:42 AM  Site / Wound Assessment Painful;Granulation tissue;Pink;Yellow 11/06/2013  9:42 AM  % Wound base Red or Granulating 15% 11/06/2013  9:42 AM  % Wound base Yellow 85% 11/06/2013  9:42 AM  % Wound base Black 0% 11/06/2013  9:42 AM   % Wound base Other (Comment) 0% 11/06/2013  9:42 AM  Peri-wound Assessment Erythema (non-blanchable);Pink 11/06/2013  9:42 AM  Wound Length (cm) 8.2 cm 11/02/2013 10:20 AM  Wound Width (cm) 7.1 cm 11/02/2013 10:20 AM  Wound Depth (cm) 0.1 cm 11/02/2013 10:20 AM  Margins Unattached edges (unapproximated) 11/06/2013  9:42 AM  Closure None 11/06/2013  9:42 AM  Drainage Amount Other (Comment) 11/06/2013  9:42 AM  Drainage Description Serous 11/06/2013  9:42 AM  Non-staged Wound Description Partial thickness 11/06/2013  9:42 AM  Treatment Debridement (Selective);Hydrotherapy (Ultrasonic mist) 11/06/2013  9:42 AM     Wound / Incision (Open or Dehisced) 10/13/13 Other (Comment) Buttocks Left;Proximal MSDA; includes 9 open areas proximal to large buttock wound (Active)  Dressing Type Other (Comment);Barrier Film (skin prep);Foam 11/06/2013  9:42 AM  Dressing Changed Changed 11/06/2013  9:42 AM  Dressing Status Clean;Dry;Intact 11/06/2013  9:42 AM  Dressing Change Frequency Monday, Wednesday, Friday 11/06/2013  9:42 AM  Site / Wound Assessment Painful;Pink;Yellow 11/06/2013  9:42 AM  % Wound base Red or Granulating 50% 11/06/2013  9:42 AM  % Wound base Yellow 50% 11/06/2013  9:42 AM  % Wound base Black 0% 11/06/2013  9:42 AM  % Wound base Other (Comment) 0% 11/06/2013  9:42 AM  Peri-wound Assessment Erythema (non-blanchable);Pink 11/06/2013  9:42 AM  Wound Length (cm) 6.3 cm 11/02/2013 10:20 AM  Wound Width (cm) 5.5 cm 11/02/2013 10:20 AM  Wound Depth (cm) 0.1 cm 11/02/2013 10:20 AM  Tunneling (cm) 0.2 10/13/2013  5:12 PM  Margins Unattached edges (unapproximated) 11/06/2013  9:42 AM  Closure None 11/06/2013  9:42 AM  Drainage Amount Minimal 11/06/2013  9:42 AM  Drainage Description Serous 11/06/2013  9:42 AM  Non-staged Wound Description Partial thickness 11/06/2013  9:42 AM  Treatment Debridement (Selective);Hydrotherapy (Ultrasonic mist) 11/06/2013  9:42 AM      Hydrotherapy Ultrasonic mist  - wound location: Lt buttock   Ultrasonic mist at 35KHz (+/-3KHz) at ___ percent: 100 % (100 down to 80 for noncontact; 50 for contact) Ultrasonic mist therapy minutes: 10 min (4-5 non-contact; 5-6 contact) Selective Debridement Selective Debridement - Location: Lt buttock wounds Selective Debridement - Tools Used: Other (comment) (tip of ultrasound wand) Selective Debridement - Tissue Removed: yellow slough   Wound Assessment and Plan  Wound Therapy - Assess/Plan/Recommendations Wound Therapy - Clinical Statement: Reassessed with Melody, RN, WOC. Again distal wound soiled with bowel movement under dressing. Medihoney from Wed no longer present. Periwound much better and able to separate dressings (proximal from distal wounds). Distal wound continues at high risk for contamination from continued bowel incontinence (pt cleaned up x 3 during session). Agree with Melody that hydrotherapy can be d/c'd at this time.  Wound Therapy - Functional Problem List: limiting all mobility per PT & OT due to pain Factors Delaying/Impairing Wound Healing: Diabetes Mellitus;Immobility;Multiple medical problems Hydrotherapy Plan:  (d/c hydrotherapy) Wound Therapy - Current Recommendations: WOC nurse Wound Therapy - Follow Up Recommendations: Skilled nursing facility Wound Plan: see above  Wound Therapy Goals- Improve the function of patient's integumentary system by progressing the wound(s) through the phases of wound healing (inflammation - proliferation - remodeling) by: Decrease Necrotic Tissue to: 70 Decrease Necrotic Tissue - Progress: Not met Increase Granulation Tissue to: 30 Increase Granulation Tissue - Progress: Not met Goals/treatment plan/discharge plan were made with and agreed upon by patient/family: Yes  Goals will be updated until maximal potential achieved or discharge criteria met.  Discharge criteria: when goals achieved, discharge from hospital, MD decision/surgical intervention, no progress towards goals,  refusal/missing three consecutive treatments without notification or medical reason.   Patient is being discharged from Hydrotherapy services secondary to:  Patient has made very little progress toward goals in a reasonable time frame (and current treatment causes him signficant pain).  Please see latest Therapy Progress Note for current wound status and progress toward goals.  Progress and discharge plan and discussed with patient/caregiver and they  Agree     GP     Ezrie Bunyan 11/06/2013, 10:24 AM Pager [phone]

## 2013-11-06 NOTE — Progress Notes (Signed)
Patient Donald [name]      DOB: Apr 25, 1946      SNK:[id number]   Palliative Medicine Team at [facility] Progress Note    Subjective: Pain doing a bit better today.  Using less IV pain medicine over past 24 hours.  Nausea still problematic and appetite poor. Nausea worse when he tries to eat.  Difficulty sleeping but meds at night helping    Filed Vitals:   11/06/13 1231  BP: 107/88  Pulse: 78  Temp: 97.6 F (36.4 C)  Resp: 15   Physical exam: GEN: Alert, NAD HEENT: [place], mmm, no oropharyngeal lesions CV:RRR LUNGS: CTAB, symmetrical expansion ABD: soft, NT, ND EXT: L AKA site with c/d/i dressing Psych: flat affect, verbalizes his wants to participate in therapy and "get better"    CBC    Component Value Date/Time   WBC 10.4 11/06/2013 0415   WBC 7.1 06/05/2013 1010   RBC 3.34* 11/06/2013 0415   RBC 4.23 06/05/2013 1010   RBC 3.54* 01/26/2012 0455   HGB 8.9* 11/06/2013 0415   HGB 9.3* 06/05/2013 1010   HCT 28.7* 11/06/2013 0415   HCT 30.8* 06/05/2013 1010   PLT 496* 11/06/2013 0415   PLT 324 06/05/2013 1010   MCV 85.9 11/06/2013 0415   MCV 72.9* 06/05/2013 1010   MCH 26.6 11/06/2013 0415   MCH 22.0* 06/05/2013 1010   MCHC 31.0 11/06/2013 0415   MCHC 30.2* 06/05/2013 1010   RDW 19.7* 11/06/2013 0415   RDW 16.9* 06/05/2013 1010   LYMPHSABS 1.0 10/28/2013 0500   LYMPHSABS 1.6 06/05/2013 1010   MONOABS 1.2* 10/28/2013 0500   MONOABS 0.5 06/05/2013 1010   EOSABS 0.1 10/28/2013 0500   EOSABS 0.1 06/05/2013 1010   BASOSABS 0.0 10/28/2013 0500   BASOSABS 0.1 06/05/2013 1010    Assessment/Recommendations:  68 yo male with PMHx of CAD, PVD, Requiring fem-pop bypass few years ago complicated by thrombosis and leg ischemia requiring AKA on 10/08/13. 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 Complicated pain and depression with prolonged hospital stay and complications of multiple medical commodities.   1. Pain- Mixed nociceptive and neuropathic pain. May also have psychosocial component  as well given that he is having trouble adjsuting to long hospital stay and setbacks. Pain from sacral wound and AKA site. Neuropathic component especially present at AKI site.  - Continue oxycontin 48m BID  - will continue to encourage PRN percocet over morphine IV (though using less, only 183mIV morphine in past 24h) - Monitor response to increased gabapentin and cymbalta.   2. Depression- On cymbalta. Increased to 6043mn 7/9.  Seen psych in past.    3. Constipation- likely opioid related. Not taking bowel regimen. Will monitor.   4. Nausea/Anorexia- Prn zofran. Maalox PRN. Appetite very poor and feels sick to his stomach after eating - will try marinol 2.5mg16mD - minimize meds as able  5. Psychosocial: Met with his wife. Married >[id number] worked at [employer] no children, but pets at hoe   6. Spiritual: Strong christian faith and receives almost daily visits from his home pastor. Strong support from his church   7. ACP- Full code, no documentation that I see on file. Will try to have at least basic discussions during hospital stay.   AaronDoran Clay Palliative Medicine [facility] Phone: [phone]

## 2013-11-06 NOTE — Consult Note (Signed)
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 Transfer from Rehab 7-2 with non healing stump, S/P debridement 7-2. Wound culture grew VRE and E coli.   During the hospital stay the patient developed paroxysmal A fib. Cardiology was consulted. He converted to sinus rhythm. Patient refused anticoagulation.   HPI/Subjective: Pt is alert and conversant.  Denies new complaints today.    Assessment/Plan:  AKA stump infection - extensive hx of PVD As per primary service Vascular Surgery -S/P I&D 10/29/13 - wound growing E coli and VRE - ID directing abx regimen   Diabetes CBG well controlled at present - cont to follow w/o change in tx plan today    Encephalopathy Probably related to pain medication - appaers to have cleared at the time of my exam today   Hypertension BP erratic - likely complicated by pain - follow w/o change in tx today   A fib Care as per Cardiology - pt has refused anticoag - rate controlled   Fecal incontinence after treatment for rectal cancer  Depression Continue with Cymbalta  Severe protein caloric malnutrition Continue with ensure TID  Stage III pressure ulcer Wound care following. Local wound care.   Code Status: FULL Family Communication: no family present at time of exam  DVT prophylaxis: lovenox  Objective: Blood pressure 107/88, pulse 78, temperature 97.6 F (36.4 C), temperature source Oral, resp. rate 15, height 5\' 9"  (1.753 m), weight 70 kg (154 lb 5.2 oz), SpO2 94.00%.  Intake/Output Summary (Last 24 hours) at 11/06/13 1310 Last data filed at 11/06/13 1200  Gross per  24 hour  Intake   1170 ml  Output   1850 ml  Net   -680 ml   Exam: General: No acute respiratory distress Lungs: Clear to auscultation bilaterally without wheezes or crackles Cardiovascular: Regular rate without murmur gallop or rub normal S1 and S2 Abdomen: Nontender, nondistended, soft, bowel sounds positive, no rebound, no ascites, no appreciable mass Extremities: No significant cyanosis, clubbing, or edema R LE   Data Reviewed: Basic Metabolic Panel:  Recent Labs Lab 10/31/13 0500 11/01/13 0030 11/01/13 0545 11/05/13 0500 11/05/13 1720  NA 134* 136*  --   --  137  K 4.1 4.2  --   --  4.0  CL 96 98  --   --  97  CO2 25 25  --   --  26  GLUCOSE 87 93  --   --  74  BUN 10 10  --   --  9  CREATININE 1.10 1.09  --  1.04 0.99  CALCIUM 8.3* 8.1*  --   --  8.5  MG  --   --  2.2  --   --    Liver Function Tests: No results found for this basename: AST, ALT, ALKPHOS, BILITOT, PROT, ALBUMIN,  in the last 168 hours  CBC:  Recent Labs Lab 10/31/13 0500 11/01/13 0545 11/05/13 1720 11/06/13 0415  WBC 19.9* 17.6* 11.7* 10.4  HGB 9.7* 9.1* 9.1* 8.9*  HCT 31.2* 29.8* 30.1* 28.7*  MCV 85.2 85.9 85.5 85.9  PLT 571* 572* 512* 496*   Cardiac Enzymes:  Recent Labs Lab 11/06/13 0415  CKTOTAL 104   CBG:  Recent Labs Lab 11/05/13 1638 11/05/13 2139 11/06/13 0430 11/06/13 0806 11/06/13 1233  GLUCAP 75 83 92 96 150*    Recent Results (from the past 240 hour(s))  URINE CULTURE     Status: None   Collection Time    10/27/13  5:13 PM      Result Value Ref Range Status   Specimen Description URINE, CLEAN CATCH   Final   Special Requests NONE   Final   Culture  Setup Time     Final   Value: 10/27/2013 19:13     Performed at [place]     Final   Value: >=100,000 COLONIES/ML     Performed at [facility]   Culture     Final   Value: Multiple bacterial morphotypes present, none predominant. Suggest appropriate recollection if  clinically indicated.     Performed at [facility]   Report Status 10/28/2013 FINAL   Final  WOUND CULTURE     Status: None   Collection Time    10/29/13 10:25 AM      Result Value Ref Range Status   Specimen Description WOUND LEFT LEG   Final   Special Requests LEFT ABOVE KNEE PT ON VANCO ZOSYN   Final   Gram Stain     Final   Value: ABUNDANT WBC PRESENT, PREDOMINANTLY PMN     NO SQUAMOUS EPITHELIAL CELLS SEEN     MODERATE GRAM POSITIVE COCCI     IN PAIRS FEW GRAM NEGATIVE RODS     Performed at [facility]   Culture     Final   Value: ABUNDANT ESCHERICHIA COLI     MODERATE VANCOMYCIN RESISTANT ENTEROCOCCUS ISOLATED     Note: CRITICAL RESULT CALLED TO, READ BACK BY AND VERIFIED WITH: TRACIE DAVIS@0811  ON [id number] BY [facility]     Performed at [facility]   Report Status 11/02/2013 FINAL   Final   Organism ID, Bacteria ESCHERICHIA COLI   Final   Organism ID, Bacteria VANCOMYCIN RESISTANT ENTEROCOCCUS ISOLATED   Final  ANAEROBIC CULTURE     Status: None   Collection Time    10/29/13 10:25 AM      Result Value Ref Range Status   Specimen Description WOUND LEFT LEG   Final   Special Requests LEFT ABOVE KNEE PT ON VANCO ZOSYN   Final   Gram Stain     Final   Value: MODERATE WBC PRESENT, PREDOMINANTLY PMN     NO SQUAMOUS EPITHELIAL CELLS SEEN     MODERATE GRAM POSITIVE COCCI     IN PAIRS FEW GRAM NEGATIVE RODS     Performed at [facility]   Culture     Final   Value: NO ANAEROBES ISOLATED     Performed at [facility]   Report Status 11/03/2013 FINAL   Final     Studies:  Recent x-ray studies have been reviewed in detail by the Attending Physician  Scheduled Meds:  Scheduled Meds: . ALPRAZolam  0.25 mg Oral BID  . ALPRAZolam  0.5 mg Oral QHS  . cefTRIAXone (ROCEPHIN)  IV  2 g Intravenous Q24H  . collagenase   Topical Daily  . DAPTOmycin (CUBICIN)  IV  500 mg Intravenous Q24H  . dipyridamole-aspirin  1 capsule Oral BID  . DULoxetine   60 mg Oral Daily  . enoxaparin (LOVENOX) injection  40 mg Subcutaneous Q24H  . feeding  supplement (ENSURE COMPLETE)  237 mL Oral TID BM  . ferrous fumarate  1 tablet Oral BID  . ferrous sulfate  325 mg Oral BID WC  . gabapentin  600 mg Oral TID  . insulin aspart  0-9 Units Subcutaneous TID WC  . lisinopril  2.5 mg Oral Daily  . metoprolol tartrate  50 mg Oral BID  . multivitamin with minerals  1 tablet Oral Daily  . omega-3 acid ethyl esters  2 g Oral BID  . OxyCODONE  40 mg Oral Q12H  . pantoprazole  40 mg Oral Daily  . saccharomyces boulardii  250 mg Oral BID  . senna-docusate  2 tablet Oral BID  . vitamin C  500 mg Oral Daily    Time spent on care of this patient: 35 mins   MCCLUNG,JEFFREY T , MD   [facility] Office  [phone] Pager - Text Page per Shea Evans as per below:  On-Call/Text Page:      [email or web address]      password [email or web address]  If 7PM-7AM, please contact night-coverage [email or web address] Password [email or web address] 11/06/2013, 1:10 PM   LOS: 8 days

## 2013-11-06 NOTE — Evaluation (Signed)
Clinical/Bedside Swallow Evaluation Patient Details  Name: Donald [name] MRN: [id number] Date of Birth: [birth year]  Today's Date: 11/06/2013 Time: 0623-7628 SLP Time Calculation (min): 20 min  Past Medical History:  Past Medical History  Diagnosis Date  . GERD (gastroesophageal reflux disease)   . Hyperlipidemia   . Hypertension   . Peripheral vascular disease   . Internal and external bleeding hemorrhoids   . History of gout     left great toe  . Depression   . Numbness in right leg     Hx: of diabetes  . Coronary artery disease     RCA occlusion with good collaterals, o/w no obs CAD 12/2011  . Pneumonia ~ 1977  . Type II diabetes mellitus   . History of blood transfusion 12/2011; 12/2012    "before OR; after OR" (01/22/2013)  . Stroke 2008    "left side weak; unable to move left hand still" (01/22/2013)  . Rectal cancer dx'd 08/2007    S/P chemo, radiation, biopsies  . Metastases to the liver dx'd 08/2010   Past Surgical History:  Past Surgical History  Procedure Laterality Date  . Carotid endarterectomy Right 2008  . Liver cryoablation  08/2010    "chemo shrunk the cancer 50% then they went in and burned the rest of it"  . Axillary-femoral bypass graft  01/29/2012    Procedure: BYPASS GRAFT AXILLA-BIFEMORAL;  Surgeon: Elam Dutch, MD;  Location: [facility] OR;  Service: Vascular;  Laterality: N/A;  Left axilla-bifemoral bypass using gore-tex graft.   . Colonoscopy w/ biopsies and polypectomy      Hx: of  . Femoral-popliteal bypass graft Left 01/06/2013    Procedure: Femoral-Popliteal Artery Bypass;  Surgeon: Elam Dutch, MD;  Location: [place];  Service: Vascular;  Laterality: Left;  . Intraoperative arteriogram Left 01/06/2013    Procedure: INTRA OPERATIVE ARTERIOGRAM;  Surgeon: Elam Dutch, MD;  Location: [place];  Service: Vascular;  Laterality: Left;  . Femoral-popliteal bypass graft Left 01/10/2013    Procedure: THROMBECTOMY POSSIBLE BYPASS GRAFT FEMORAL-POPLITEAL  ARTERY;  Surgeon: Mal Misty, MD;  Location: [place];  Service: Vascular;  Laterality: Left;  Attempted Thrombectomy of left Femoral/Popliteal graft - Unsuccessful.  Insertion of Left femoral to below the knee popliteal propaten graft.  . Mass biopsy  2009    "found mass that was cancer; later had chemo and radiation; never cut mass out" (01/22/2013)  . Femoral-popliteal bypass graft Left 09/05/2013    Procedure: Thrombectomy of Left Femoral-Below Knee Popliteal Bypass Graft; Patch Angioplasty of Distal Portion of Bypass Graft;  Surgeon: Angelia Mould, MD;  Location: [place];  Service: Vascular;  Laterality: Left;  . Patch angioplasty Left 09/05/2013    Procedure: Patch Angioplasty Distal Portion of Left Femoral-Below Knee Popliteal Bypass Graft;  Surgeon: Angelia Mould, MD;  Location: [place];  Service: Vascular;  Laterality: Left;  . Intraoperative arteriogram Left 09/05/2013    Procedure: INTRA OPERATIVE ARTERIOGRAM;  Surgeon: Angelia Mould, MD;  Location: [place];  Service: Vascular;  Laterality: Left;  . Fasciotomy Left 09/05/2013    Procedure: FASCIOTOMY;  Surgeon: Angelia Mould, MD;  Location: [place];  Service: Vascular;  Laterality: Left;  Marland Kitchen Vascular surgery    . Fasciotomy closure Left 09/11/2013    Procedure: CLOSURE OF FASCIOTOMY SITES LEFT LOWER EXTREMITY AND WOUND VAC APPLICATION;  Surgeon: Angelia Mould, MD;  Location: [place];  Service: Vascular;  Laterality: Left;  . Amputation Left 10/08/2013  Procedure: LEFT ABOVE KNEE AMPUTATION;  Surgeon: Serafina Mitchell, MD;  Location: [place];  Service: Vascular;  Laterality: Left;  . I&d extremity Left 10/29/2013    Procedure: IRRIGATION AND DEBRIDEMENT LEFT ABOVE KNEE AMPUTATION;  Surgeon: Angelia Mould, MD;  Location: [place];  Service: Vascular;  Laterality: Left;  . 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 Transfer from Rehab 7-2 with non healing stump, S/P debridement 7-2. Wound culture grew VRE and E coli. Patient developed paroxysmal A fib and was transferred back to acute care. Patient relates difficulty with swallowing, food doesn't go down for last few days. He has some difficulty clearing throat from secretions.   Assessment / Plan / Recommendation Clinical Impression  Pt demonstrates a primary oral dysphagia with complaint of feeling as though food "gets bigger in my mouth." Pt noted to have very dry oral mucosa with no saliva and brown cracked appearance of tongue. He reports he cleans his dentures daily but does not clean his mouth. SLP provided thorough oral care followed by PO trials. Pt tolerated small sips of thin liquids but was noted to have wet vocal quality following large sips of water. He reports that he almost always takes small sips to avoid choking since his stroke. Mastication was functional but pt did have residue sticking to his tongue and gums due to dryness. Recommend a down grade to dys 3 (mechanical soft) texture with thin liquids with small sips. Also provided pt with verbal and written recommendations for oral care and improved hydration strategies. Pt should take pills whole in pudding. SLP will /fu x1 for tolerance and education with wife.     Aspiration Risk  Mild    Diet Recommendation Dysphagia 3 (Mechanical Soft);Thin liquid   Liquid Administration via: Cup;Straw Medication Administration: Whole meds with puree Supervision: Patient able to self feed;Staff to assist with self feeding Compensations: Slow rate;Small sips/bites;Follow solids with liquid Postural Changes and/or Swallow  Maneuvers: Out of bed for meals    Other  Recommendations Oral Care Recommendations: Other (Comment) (oral care to mouth 3x a day with toothbrush or biotene)   Follow Up Recommendations  Inpatient Rehab    Frequency and Duration min 1 x/week  1 week   Pertinent Vitals/Pain NA    SLP Swallow Goals     Swallow Study Prior Functional Status       General HPI: 68 year old with PMH significant for Hypertension, Peripheral Vascular diseases, CAD, Stroke, history of rectal cancer S/P chemo and radiation, 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 Transfer from Rehab 7-2 with non healing stump, S/P debridement 7-2. Wound culture grew VRE and E coli. Patient developed paroxysmal A fib and was transferred back to acute care. Patient relates difficulty with swallowing, food doesn't go down for last few days. He has some difficulty clearing throat from secretions. Type of Study: Bedside swallow evaluation Previous Swallow Assessment: 09/25/2006 - results not available Diet Prior to this Study: Regular;Thin liquids Temperature Spikes Noted: No Respiratory Status: Nasal cannula History of Recent Intubation: No Behavior/Cognition: Alert;Cooperative;Pleasant  mood Oral Cavity - Dentition: Dentures, top;Dentures, bottom Self-Feeding Abilities: Able to feed self;Needs assist Patient Positioning: Upright in bed Baseline Vocal Quality: Clear Volitional Cough: Strong Volitional Swallow: Able to elicit    Oral/Motor/Sensory Function Overall Oral Motor/Sensory Function: Impaired at baseline Labial ROM: Reduced left Labial Symmetry: Abnormal symmetry left Labial Strength: Reduced Labial Sensation: Reduced Lingual ROM: Within Functional Limits Lingual Symmetry: Within Functional Limits Lingual Strength: Within Functional Limits Lingual Sensation: Within Functional Limits Facial ROM: Within Functional Limits Facial Symmetry:  Within Functional Limits Facial Strength: Within Functional Limits Facial Sensation: Within Functional Limits Velum: Within Functional Limits Mandible: Within Functional Limits   Ice Chips Ice chips: Not tested   Thin Liquid Thin Liquid: Impaired Presentation: Cup;Straw Pharyngeal  Phase Impairments: Wet Vocal Quality (with large straw sips)    Nectar Thick Nectar Thick Liquid: Not tested   Honey Thick Honey Thick Liquid: Not tested   Puree Puree: Within functional limits   Solid   GO    Solid: Impaired Presentation: Self Fed Oral Phase Impairments: Impaired anterior to posterior transit Oral Phase Functional Implications: Oral residue      [employer], MA CCC-SLP [phone]  Dawon Troop, Katherene Ponto 11/06/2013,10:32 AM

## 2013-11-06 NOTE — Discharge Summary (Signed)
NAMEMarland Kitchen  Donald, ESGUERRA NO.:  [id number]  MEDICAL RECORD NO.:  [id number]  LOCATION:  4W07C                        FACILITY:  [facility]  PHYSICIAN:  Charlett Blake, M.D.DATE OF BIRTH:  1945-10-12  DATE OF ADMISSION:  10/13/2013 DATE OF DISCHARGE:  10/29/2013                              DISCHARGE SUMMARY   DISCHARGE DIAGNOSES: 1. Infected postoperative wound. 2. Above-knee amputation. 3. Hypertension. 4. 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 5. Poor pain control  6. History of cerebrovascular accident with left hemiparesis. 7. Sacral decubitus. 8. Depression. 9. Elevated anxiety levels. 10.Anemia.  HISTORY OF PRESENT ILLNESS:  [name] is a 68 year old male with history of CVA and left hemiparesis, hypertension, peripheral vascular disease with multiple revascularization procedures as well as recent 4- compartment fasciotomy, left lower extremity with attempts at limb salvage.  He was readmitted to hospital on October 07, 2013, with onset of numbness due to recurrent thrombosis, left femoral popliteal bypass. The high left AKA was recommended due to level of demarcation and patient underwent this procedure on October 08, 2013, by Dr. Trula Slade. Postop, has had severe depressive symptoms with lability.  He was started on Cymbalta for mood stabilization per input from Psychiatry. OxyContin as well as Toradol was used for pain management.  Anemia was treated with IV iron infusion.  Therapies were ongoing and CIR was recommended by Rehab Team.  PAST MEDICAL HISTORY: 1. Hypertension. 2. GERD. 3. Peripheral vascular disease. 4. History of hemorrhoids. 5. History of gout. 6. Depression. 7. Diabetic neuropathy, right lower extremity. 8. DM, type 2. 9. History of stroke with left hemiparesis. 10.History of rectal cancer status post chemoradiation with ongoing     rectal pain, liver mets. 11.Multiple peripheral vascular  procedures. 12.Chronic infection, left groin wound.  SOCIAL HISTORY:  Patient is married. He required supervision past most recent hospital discharge.  He continued to smoke about half-pack per day at home due to pain as well as anxiety issues. He does not use any alcohol or illicit drugs.  HOSPITAL COURSE:  [name] was admitted to Rehab on October 13, 2013, for inpatient therapies to consist of PT, OT at least 3 hours 5 days a week.  On past admission, physiatrist, rehab RN, and therapy team have worked together to provide customized collaborative interdisciplinary care.  Ego support has been provided by whole team to help with depressed mood.  Nutritional supplements were added to help with his poor p.o. intake as well as to help promote wound healing.  He was noted to have sacral decubitus that was treated with Diflucan due to concerns of fungal infection as well as pressure relief measures and skin barrier cream.  This was ineffective in providing healing, therefore hydrotherapy was initiated to help with debridement of slough and promote wound healing.  His left BKA has been monitored along.  This did started developing drainage with maceration of edges.  Vascular Service was consulted for input and recommended IV antibiotics due to concerns of wound infection.   Check of lytes showed renal status to be stable and hyponatremia was slowly resolving.  CBC showed the patient to have progressive leukocytosis with white count up to 19.0.  Surgical intervention with I and D was  recommended by Dr. Doren Custard. Original plan was for patient to return to CIR post procedure. Patient was having poor tolerance of therapy due to pain as well as poor endurance and LTAC or SNF was recommended by team for follow up therapy.  However, patient developed atrial flutter post procedure and required transfer to ICU for closer monitoring. past surgery with hypertension requiring closer monitoring. He was  discharged to acute service on 10/29/13. At discharge patient was requiring max assist for squat pivot transfer. He required supervision for upper body dressing and mod to max assist with bathing and LB tasks.   DISCHARGE MEDICATIONS: 1. Aggrenox 1 p.o. b.i.d. 2. Colace 100 mg p.o. b.i.d. 3. Cymbalta 30 mg p.o. per day. 4. Hemocyte 325 mg p.o. b.i.d. 5. Gabapentin 300 mg p.o. t.i.d. 6. Levemir 10 units subcu per day. 7. Lisinopril 2.5 mg p.o. per day. 8. Metformin 500 mg p.o. b.i.d. 9. Lovaza 2 g p.o. b.i.d. 10.Omeprazole 40 mg p.o. per day. 11.OxyIR 5-10 mg p.o. q.6 h. p.r.n. severe pain. 12.OxyContin 40 mg p.o. q.12 h. 13.Florastor 1 cap b.i.d. 14.Senokot 2 tabs p.o. at bedtime. 15.Tramadol 50 mg q.6 h. p.r.n. moderate pain. 16.Trazodone 25-50 mg p.o. at bedtime p.r.n. insomnia. 17.Vitamin C 500 mg p.o. per day. 18.Xanax 0.25 mg b.i.d. and 0.5 mg at bedtime.  DIET:  Carb modified medium.  ACTIVITY:  Activity level per primary care team.     Reesa Chew, P.A.   ______________________________ Charlett Blake, M.D.    PL/MEDQ  D:  11/05/2013  T:  11/06/2013  Job:  [id number]

## 2013-11-06 NOTE — Progress Notes (Signed)
ANTIBIOTIC CONSULT NOTE - FOLLOW UP  Pharmacy Consult for daptomycin Indication: L AKA wound infection/osteomyelitis  Labs:  Recent Labs  11/05/13 0500 11/05/13 1720 11/06/13 0415  WBC  --  11.7* 10.4  HGB  --  9.1* 8.9*  PLT  --  512* 496*  CREATININE 1.04 0.99  --    Estimated Creatinine Clearance: 69.7 ml/min (by C-G formula based on Cr of 0.99).  Microbiology: Recent Results (from the past 720 hour(s))  SURGICAL PCR SCREEN     Status: None   Collection Time    10/08/13 12:12 AM      Result Value Ref Range Status   MRSA, PCR NEGATIVE  NEGATIVE Final   Staphylococcus aureus NEGATIVE  NEGATIVE Final   Comment:            The Xpert SA Assay (FDA     approved for NASAL specimens     in patients over 65 years of age),     is one component of     a comprehensive surveillance     program.  Test performance has     been validated by [employer] for patients greater     than or equal to 83 year old.     It is not intended     to diagnose infection nor to     guide or monitor treatment.  URINE CULTURE     Status: None   Collection Time    10/27/13  5:13 PM      Result Value Ref Range Status   Specimen Description URINE, CLEAN CATCH   Final   Special Requests NONE   Final   Culture  Setup Time     Final   Value: 10/27/2013 19:13     Performed at [place]     Final   Value: >=100,000 COLONIES/ML     Performed at [facility]   Culture     Final   Value: Multiple bacterial morphotypes present, none predominant. Suggest appropriate recollection if clinically indicated.     Performed at [facility]   Report Status 10/28/2013 FINAL   Final  WOUND CULTURE     Status: None   Collection Time    10/29/13 10:25 AM      Result Value Ref Range Status   Specimen Description WOUND LEFT LEG   Final   Special Requests LEFT ABOVE KNEE PT ON VANCO ZOSYN   Final   Gram Stain     Final   Value: ABUNDANT WBC PRESENT, PREDOMINANTLY PMN      NO SQUAMOUS EPITHELIAL CELLS SEEN     MODERATE GRAM POSITIVE COCCI     IN PAIRS FEW GRAM NEGATIVE RODS     Performed at [facility]   Culture     Final   Value: ABUNDANT ESCHERICHIA COLI     MODERATE VANCOMYCIN RESISTANT ENTEROCOCCUS ISOLATED     Note: CRITICAL RESULT CALLED TO, READ BACK BY AND VERIFIED WITH: TRACIE DAVIS@0811  ON [id number] BY [facility]     Performed at [facility]   Report Status 11/02/2013 FINAL   Final   Organism ID, Bacteria ESCHERICHIA COLI   Final   Organism ID, Bacteria VANCOMYCIN RESISTANT ENTEROCOCCUS ISOLATED   Final  ANAEROBIC CULTURE     Status: None   Collection Time    10/29/13 10:25 AM      Result Value Ref Range Status   Specimen  Description WOUND LEFT LEG   Final   Special Requests LEFT ABOVE KNEE PT ON VANCO ZOSYN   Final   Gram Stain     Final   Value: MODERATE WBC PRESENT, PREDOMINANTLY PMN     NO SQUAMOUS EPITHELIAL CELLS SEEN     MODERATE GRAM POSITIVE COCCI     IN PAIRS FEW GRAM NEGATIVE RODS     Performed at [facility]   Culture     Final   Value: NO ANAEROBES ISOLATED     Performed at [facility]   Report Status 11/03/2013 FINAL   Final    Assessment: 68 yo male with left leg wound (noted s/p L AKA on 10/29/13) and cultures showing VRE and E.coli. Pharmacy has been consulted to dose daptomycin per ID recommendations.  Baseline CK value is 104 - normal  7/9 daptomycin 7/9 rocephin>> 7/6 cipro >>7/9 6/30 vancomycin>> 7/6 6/30 zosyn>> 7/6 6/25 Cephalexin >>6/30   cultures 7/2 left leg wound: VRE and E. Coli (pan-S)  Plan:  -increase dapto to 500mg  daily (usually use higher doses for VRE due to elevated MIC values) - continue ceftriaxone 2g daily per ID -Will follow renal function, weekly CPKs and clinical progress  Heide Guile, PharmD, BCPS Clinical Pharmacist Pager [phone]

## 2013-11-06 NOTE — Clinical Documentation Improvement (Signed)
"  Sharp debridement of some necrotic tissue noted" documented in 7/9 progress note.   [facility]ers and CDI cannot assume you mean excisional debridement by using the word "sharp". It must be documented  Please clarify if this debridement is an: Excisional debridement               Non excisional debridement    Please document findings in next progress note and discharge summary.   Thank You,  Zoila Shutter ,RN Clinical Documentation Specialist:  [place] [employer]

## 2013-11-07 LAB — GLUCOSE, CAPILLARY
GLUCOSE-CAPILLARY: 158 mg/dL — AB (ref 70–99)
Glucose-Capillary: 125 mg/dL — ABNORMAL HIGH (ref 70–99)
Glucose-Capillary: 188 mg/dL — ABNORMAL HIGH (ref 70–99)

## 2013-11-07 NOTE — Progress Notes (Addendum)
[facility] Progress Note  11/07/2013 8:24 AM   Subjective:  Sleeping--awakes to voice  Afebrile VSS 96% 2LO2NC  Filed Vitals:   11/07/13 0748  BP: 157/61  Pulse: 74  Temp: 97.5 F (36.4 C)  Resp: 12    Physical Exam: Incisions:  Wound still with some necrotic fat, but there are areas with granulating tissue   CBC    Component Value Date/Time   WBC 10.4 11/06/2013 0415   WBC 7.1 06/05/2013 1010   RBC 3.34* 11/06/2013 0415   RBC 4.23 06/05/2013 1010   RBC 3.54* 01/26/2012 0455   HGB 8.9* 11/06/2013 0415   HGB 9.3* 06/05/2013 1010   HCT 28.7* 11/06/2013 0415   HCT 30.8* 06/05/2013 1010   PLT 496* 11/06/2013 0415   PLT 324 06/05/2013 1010   MCV 85.9 11/06/2013 0415   MCV 72.9* 06/05/2013 1010   MCH 26.6 11/06/2013 0415   MCH 22.0* 06/05/2013 1010   MCHC 31.0 11/06/2013 0415   MCHC 30.2* 06/05/2013 1010   RDW 19.7* 11/06/2013 0415   RDW 16.9* 06/05/2013 1010   LYMPHSABS 1.0 10/28/2013 0500   LYMPHSABS 1.6 06/05/2013 1010   MONOABS 1.2* 10/28/2013 0500   MONOABS 0.5 06/05/2013 1010   EOSABS 0.1 10/28/2013 0500   EOSABS 0.1 06/05/2013 1010   BASOSABS 0.0 10/28/2013 0500   BASOSABS 0.1 06/05/2013 1010    BMET    Component Value Date/Time   NA 137 11/05/2013 1720   NA 137 06/05/2013 1010   NA 147* 10/12/2011 0959   K 4.0 11/05/2013 1720   K 5.4* 06/05/2013 1010   K 5.2* 10/12/2011 0959   CL 97 11/05/2013 1720   CL 102 05/19/2012 1427   CL 102 10/12/2011 0959   CO2 26 11/05/2013 1720   CO2 23 06/05/2013 1010   CO2 29 10/12/2011 0959   GLUCOSE 74 11/05/2013 1720   GLUCOSE 239* 06/05/2013 1010   GLUCOSE 205* 05/19/2012 1427   GLUCOSE 244* 10/12/2011 0959   BUN 9 11/05/2013 1720   BUN 19.5 06/05/2013 1010   BUN 13 10/12/2011 0959   CREATININE 0.99 11/05/2013 1720   CREATININE 1.4* 06/05/2013 1010   CREATININE 1.0 10/12/2011 0959   CALCIUM 8.5 11/05/2013 1720   CALCIUM 10.1 06/05/2013 1010   CALCIUM 9.0 10/12/2011 0959   GFRNONAA 82* 11/05/2013 1720   GFRAA >90 11/05/2013 1720    INR    Component Value  Date/Time   INR 1.06 10/07/2013 1748     Intake/Output Summary (Last 24 hours) at 11/07/13 0824 Last data filed at 11/07/13 0750  Gross per 24 hour  Intake   1170 ml  Output   1700 ml  Net   -530 ml     Assessment:  68 y.o. male is s/p:  Revision of left AKA with VRE  Plan: -excisional debridement to left AKA wound again this am.  Possible return to OR next week for further debridement as pt will not tolerate much at bedside. -continue ABx per ID -continue tid dressing changes to stump -still tenuous -- may need hip disarticulation in the future, but will continue with wound care at this time as pt does have some healthy tissue present in wound.   -DVT prophylaxis:  Lovenox   Leontine Locket, PA-C [facility] [phone] 11/07/2013 8:24 AM    Agree with above Excisional debridement of wound performed at bedside Dr. Scot Dock  Will further evaluate on Monday to see if further debridement and OR will be  necessary on Tuesday

## 2013-11-07 NOTE — Consult Note (Signed)
  [place] TEAM 1 - Stepdown/ICU TEAM CONSULT F/U NOTE  Pt date reviewed.  No recommended change in tx plan today.  Will f/u w/ full visit in AM.  Cherene Altes, MD [facility] For Consults/Admissions - Flow Manager - [phone] Office  [phone] Pager [phone]  On-Call/Text Page:      [email or web address]      password [facility]

## 2013-11-07 NOTE — Progress Notes (Addendum)
Patient Donald [name]      DOB: 1945-12-27      JTT:[id number]  Stopped in to see Donald [name] this afternoon.  Drowsy today after receiving pain meds for debridement.  Will check in with him tomorrow.   Doran Clay D.O. Palliative Medicine Team at [facility]  Team Phone: [phone]

## 2013-11-07 NOTE — Progress Notes (Signed)
Clinical Education officer, museum (CSW) met with patient's wife Eliezer Khawaja at bedside. CSW gave wife bed offers. Wife stated she preferred [place] or [place]. CSW made wife aware that [place] and [place] did not offer a bed at this time. Wife reported that she would look at the list of offers and discuss it with patient. CSW will continue to follow and assist as needed.   Blima Rich, [place] Weekend CSW [phone]

## 2013-11-08 LAB — GLUCOSE, CAPILLARY
GLUCOSE-CAPILLARY: 155 mg/dL — AB (ref 70–99)
GLUCOSE-CAPILLARY: 132 mg/dL — AB (ref 70–99)
Glucose-Capillary: 164 mg/dL — ABNORMAL HIGH (ref 70–99)

## 2013-11-08 MED ORDER — ALPRAZOLAM 0.5 MG PO TABS
0.5000 mg | ORAL_TABLET | Freq: Every evening | ORAL | Status: DC | PRN
  Filled 2013-11-08: qty 1

## 2013-11-08 MED ORDER — ALPRAZOLAM 0.25 MG PO TABS: 0.2500 mg | ORAL_TABLET | Freq: Two times a day (BID) | ORAL | Status: DC | PRN

## 2013-11-08 NOTE — Progress Notes (Signed)
Progress Note from the Palliative Medicine Team at [place]: More drowsy today. Wife at bedside and states that he has been sleeping most of day.  Wakes up to voice, but wants to go back to sleep in short period of time. Would rather sleep than eat.  In talking with Yvone Neu today, he seems slightly confused. Does not really answer yes/no when I ask about pain or other symptoms.      Objective: Allergies  Allergen Reactions  . Other     Became hypotensive. Pt states allergic to "muscle relaxer" but unsure of name.  . Tape Hives and Itching    medfix non woven tape   Scheduled Meds: . cefTRIAXone (ROCEPHIN)  IV  2 g Intravenous Q24H  . collagenase   Topical Daily  . DAPTOmycin (CUBICIN)  IV  500 mg Intravenous Q24H  . dipyridamole-aspirin  1 capsule Oral BID  . dronabinol  2.5 mg Oral BID AC  . DULoxetine  60 mg Oral Daily  . enoxaparin (LOVENOX) injection  40 mg Subcutaneous Q24H  . feeding supplement (ENSURE COMPLETE)  237 mL Oral TID BM  . ferrous fumarate  1 tablet Oral BID  . ferrous sulfate  325 mg Oral BID WC  . gabapentin  600 mg Oral TID  . insulin aspart  0-9 Units Subcutaneous TID WC  . lisinopril  2.5 mg Oral Daily  . metoprolol tartrate  50 mg Oral BID  . multivitamin with minerals  1 tablet Oral Daily  . omega-3 acid ethyl esters  2 g Oral BID  . OxyCODONE  40 mg Oral Q12H  . pantoprazole  40 mg Oral Daily  . saccharomyces boulardii  250 mg Oral BID  . senna-docusate  2 tablet Oral BID  . vitamin C  500 mg Oral Daily   Continuous Infusions: . sodium chloride 50 mL/hr at 11/07/13 2109   PRN Meds:.ALPRAZolam, ALPRAZolam, alum & mag hydroxide-simeth, guaiFENesin-dextromethorphan, hydrALAZINE, metoprolol, metoprolol tartrate, morphine injection, ondansetron, oxyCODONE-acetaminophen, phenol, polyethylene glycol, traZODone  BP 126/54  Pulse 64  Temp(Src) 98.8 F (37.1 C) (Axillary)  Resp 11  Ht $R'5\' 9"'FQ$  (1.753 m)  Wt 70 kg (154 lb 5.2 oz)  BMI 22.78  kg/m2  SpO2 90%     Intake/Output Summary (Last 24 hours) at 11/08/13 1641 Last data filed at 11/08/13 1500  Gross per 24 hour  Intake   2650 ml  Output   1425 ml  Net   1225 ml    Physical Exam:  General: Drowsy but responds to voice. Less interactive today HEENT: [place], mmm Chest:  CTAB, symm expnasion CVS: RRR Abdomen:soft, NT, ND Ext: L AKA Neuro: no inducible clonus on exam.   Labs: CBC    Component Value Date/Time   WBC 10.4 11/06/2013 0415   WBC 7.1 06/05/2013 1010   RBC 3.34* 11/06/2013 0415   RBC 4.23 06/05/2013 1010   RBC 3.54* 01/26/2012 0455   HGB 8.9* 11/06/2013 0415   HGB 9.3* 06/05/2013 1010   HCT 28.7* 11/06/2013 0415   HCT 30.8* 06/05/2013 1010   PLT 496* 11/06/2013 0415   PLT 324 06/05/2013 1010   MCV 85.9 11/06/2013 0415   MCV 72.9* 06/05/2013 1010   MCH 26.6 11/06/2013 0415   MCH 22.0* 06/05/2013 1010   MCHC 31.0 11/06/2013 0415   MCHC 30.2* 06/05/2013 1010   RDW 19.7* 11/06/2013 0415   RDW 16.9* 06/05/2013 1010   LYMPHSABS 1.0 10/28/2013 0500   LYMPHSABS 1.6 06/05/2013 1010   MONOABS  1.2* 10/28/2013 0500   MONOABS 0.5 06/05/2013 1010   EOSABS 0.1 10/28/2013 0500   EOSABS 0.1 06/05/2013 1010   BASOSABS 0.0 10/28/2013 0500   BASOSABS 0.1 06/05/2013 1010    BMET    Component Value Date/Time   NA 137 11/05/2013 1720   NA 137 06/05/2013 1010   NA 147* 10/12/2011 0959   K 4.0 11/05/2013 1720   K 5.4* 06/05/2013 1010   K 5.2* 10/12/2011 0959   CL 97 11/05/2013 1720   CL 102 05/19/2012 1427   CL 102 10/12/2011 0959   CO2 26 11/05/2013 1720   CO2 23 06/05/2013 1010   CO2 29 10/12/2011 0959   GLUCOSE 74 11/05/2013 1720   GLUCOSE 239* 06/05/2013 1010   GLUCOSE 205* 05/19/2012 1427   GLUCOSE 244* 10/12/2011 0959   BUN 9 11/05/2013 1720   BUN 19.5 06/05/2013 1010   BUN 13 10/12/2011 0959   CREATININE 0.99 11/05/2013 1720   CREATININE 1.4* 06/05/2013 1010   CREATININE 1.0 10/12/2011 0959   CALCIUM 8.5 11/05/2013 1720   CALCIUM 10.1 06/05/2013 1010   CALCIUM 9.0 10/12/2011 0959   GFRNONAA 82* 11/05/2013 1720    GFRAA >90 11/05/2013 1720    CMP     Component Value Date/Time   NA 137 11/05/2013 1720   NA 137 06/05/2013 1010   NA 147* 10/12/2011 0959   K 4.0 11/05/2013 1720   K 5.4* 06/05/2013 1010   K 5.2* 10/12/2011 0959   CL 97 11/05/2013 1720   CL 102 05/19/2012 1427   CL 102 10/12/2011 0959   CO2 26 11/05/2013 1720   CO2 23 06/05/2013 1010   CO2 29 10/12/2011 0959   GLUCOSE 74 11/05/2013 1720   GLUCOSE 239* 06/05/2013 1010   GLUCOSE 205* 05/19/2012 1427   GLUCOSE 244* 10/12/2011 0959   BUN 9 11/05/2013 1720   BUN 19.5 06/05/2013 1010   BUN 13 10/12/2011 0959   CREATININE 0.99 11/05/2013 1720   CREATININE 1.4* 06/05/2013 1010   CREATININE 1.0 10/12/2011 0959   CALCIUM 8.5 11/05/2013 1720   CALCIUM 10.1 06/05/2013 1010   CALCIUM 9.0 10/12/2011 0959   PROT 5.9* 10/14/2013 0605   PROT 7.9 06/05/2013 1010   PROT 7.6 10/12/2011 0959   ALBUMIN 2.1* 10/14/2013 0605   ALBUMIN 3.9 06/05/2013 1010   AST 35 10/14/2013 0605   AST 32 06/05/2013 1010   AST 51* 10/12/2011 0959   ALT 13 10/14/2013 0605   ALT 26 06/05/2013 1010   ALT 45 10/12/2011 0959   ALKPHOS 83 10/14/2013 0605   ALKPHOS 112 06/05/2013 1010   ALKPHOS 90* 10/12/2011 0959   BILITOT 0.4 10/14/2013 0605   BILITOT 0.37 06/05/2013 1010   BILITOT 0.50 10/12/2011 0959   GFRNONAA 82* 11/05/2013 1720   GFRAA >90 11/05/2013 1720    Assessment/Recommendations:  68 yo male with PMHx of CAD, PVD, Requiring fem-pop bypass few years ago complicated by thrombosis and leg ischemia requiring AKA on 10/08/13. 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 Complicated pain and depression with prolonged hospital stay and complications of multiple medical commodities.   1. Pain- Mixed nociceptive and neuropathic pain. May also have psychosocial component as well given that he is having trouble adjsuting to long hospital stay and setbacks. Pain from sacral wound and AKA site. Neuropathic component especially present at AKI site.  - Continue oxycontin 40mg  BID, PRN morphine/oxycodone- less use in  past 24h - Monitor response to increased gabapentin and cymbalta.  -  watch sedation issues. (suspect benzo's may be potentiating sedating effects of opioids as well)  2. Depression/Anxiety- On cymbalta. Increased to $RemoveBefo'60mg'nPvWogMAsHQ$  on 7/9. Seen psych in past. Given his drowsiness today will change his scheduled benzo's to PRN.  Possible that addition of benzo's potentiating opioids and vice versa.  If changing to PRN does not help, may need to back off opioids and other sedating meds. Hopefully increase in cymbalta helps anxiety as well.   3. Constipation- likely opioid related. Not taking bowel regimen. Will monitor.   4. Nausea/Anorexia- Prn zofran. Maalox PRN. Appetite very poor and feels sick to his stomach after eating  - marinol 2.$RemoveBef'5mg'frwcRqmqOM$  BID started 7/11.  Doubt this low dose would contribute much to sedation.  - minimize meds as able   5. Psychosocial: Met with his wife. Married >67yrs. Formerly worked at [employer]. Has no children, but pets at hoe   6. Spiritual: Strong christian faith and receives almost daily visits from his home pastor. Strong support from his church   7. ACP- Full code, no documentation that I see on file. Will try to have at least basic discussions during hospital stay. Too drowsy today.     Greater than 50%  of this time was spent counseling and coordinating care related to the above assessment and plan.   Doran Clay D.O. Palliative Medicine Team at [facility]  Team Phone: [phone]

## 2013-11-08 NOTE — Progress Notes (Signed)
Patient ID: Donald [name], male   DOB: 12-03-1945, 68 y.o.   MRN: [id number] Vascular Surgery Progress Note  Subjective: Continuing 3 times a day dressing changes left AKA stump. This was sharply debrided at bedside yesterday. Patient with no specific complaints today.  Objective:  Filed Vitals:   11/08/13 0756  BP: 162/81  Pulse: 85  Temp: 97.2 F (36.2 C)  Resp: 20    Gen. alert and oriented x3 Left AKA stump dressing intact-discussed with the nurses to change dressing earlier this a.m.   Labs:  Recent Labs Lab 11/05/13 0500 11/05/13 1720  CREATININE 1.04 0.99    Recent Labs Lab 11/05/13 0500 11/05/13 1720  NA  --  137  K  --  4.0  CL  --  97  CO2  --  26  BUN  --  9  CREATININE 1.04 0.99  GLUCOSE  --  74  CALCIUM  --  8.5    Recent Labs Lab 11/05/13 1720 11/06/13 0415  WBC 11.7* 10.4  HGB 9.1* 8.9*  HCT 30.1* 28.7*  PLT 512* 496*   No results found for this basename: INR,  in the last 168 hours  I/O last 3 completed shifts: In: 1400 [P.O.:180; I.V.:500; IV Piggyback:720] Out: 1925 [Urine:1925]  Imaging: Dg Esophagus  11/06/2013   EXAM: ESOPHOGRAM/BARIUM SWALLOW  TECHNIQUE: Single contrast examination was performed using thin barium or water soluble.  FLUOROSCOPY TIME:  Dictate in minutes and seconds  COMPARISON:  None.  FINDINGS: Study was limited by patient positioning. Imaging was performed with the patient at about 10-15 degrees head up and in both posterior oblique positions.  With swallows of thin barium, there is no evidence for esophageal mass, diverticulum, or gross stricture. There is lack of primary peristalsis on multiple swallows.  Patient was given a 13 mm barium tablet with water. He had considerable difficulty moving the tablet from the hypopharynx into the esophagus requiring multiple repeat swallows of water. Eventually, with an additional swallow of barium, the tablet was moved into the esophagus and traveled as far as the distal  esophagus, but did not pass into the stomach. Given his immobility and inability to stand upright, this tablet is likely static at this position due to lack of strong primary peristaltic stripping wave and positioning rather than a true esophageal stricture.  IMPRESSION: No evidence for gross esophageal mass lesion or diverticulum. No definite esophageal stricture.  Barium tablet becomes static in the distal esophagus most likely as a consequence of interrupted peristalsis and supine positioning since there was no evidence of stricture on the opacified images of the distal esophagus.   Electronically Signed   By: Misty Stanley M.D.   On: 11/06/2013 12:03    Assessment/Plan:    LOS: 10 days  s/p   Dr. Scot Dock will examine the wound tomorrow and decide whether further sharp debridement and OR will be necessary on Tuesday   Tinnie Gens, MD 11/08/2013 8:48 AM

## 2013-11-08 NOTE — Consult Note (Signed)
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 Transfer from Rehab 7-2 with non healing stump, S/P debridement 7-2. Wound culture grew VRE and E coli.   During the hospital stay the patient developed paroxysmal A fib. Cardiology was consulted. He converted to sinus rhythm. Patient refused anticoagulation.   HPI/Subjective: Pt is resting comfortably with no new complaints today.    Assessment/Plan:  AKA stump infection - extensive hx of PVD As per primary service Vascular Surgery -S/P I&D 10/29/13 - wound growing E coli and VRE - ID directing abx regimen   Diabetes CBG reasonably controlled at present - cont to follow w/o change in tx plan today    Encephalopathy Probably related to pain medication - appears to be steadily improving   Hypertension BP reasonably controlled at this time   A fib Care as per Cardiology - pt has refused anticoag - rate controlled   Fecal incontinence after treatment for rectal cancer  Depression Continue with Cymbalta  Severe protein caloric malnutrition Continue with ensure TID  Stage III pressure ulcer Wound care following  Code Status: FULL Family Communication: spoke w/ wife at bedside   DVT prophylaxis: lovenox  Objective: Blood pressure 133/48, pulse 66, temperature 98.4 F (36.9 C), temperature source Axillary, resp. rate 13, height 5\' 9"  (1.753 m), weight 70 kg (154 lb 5.2 oz), SpO2 93.00%.  Intake/Output Summary (Last 24 hours) at 11/08/13 1607 Last data filed at 11/08/13 1500  Gross per 24 hour  Intake   2650 ml  Output   1425 ml  Net   1225 ml    Exam: General: No acute respiratory distress Lungs: Clear to auscultation bilaterally Cardiovascular: Regular rate without murmur gallop or rub  Abdomen: Nontender, nondistended, soft, bowel sounds positive, no rebound, no ascites, no appreciable mass Extremities: No significant cyanosis, clubbing, or edema R LE   Data Reviewed: Basic Metabolic Panel:  Recent Labs Lab 11/05/13 0500 11/05/13 1720  NA  --  137  K  --  4.0  CL  --  97  CO2  --  26  GLUCOSE  --  74  BUN  --  9  CREATININE 1.04 0.99  CALCIUM  --  8.5   Liver Function Tests: No results found for this basename: AST, ALT, ALKPHOS, BILITOT, PROT, ALBUMIN,  in the last 168 hours  CBC:  Recent Labs Lab 11/05/13 1720 11/06/13 0415  WBC 11.7* 10.4  HGB 9.1* 8.9*  HCT 30.1* 28.7*  MCV 85.5 85.9  PLT 512* 496*   Cardiac Enzymes:  Recent Labs Lab 11/06/13 0415  CKTOTAL 104   CBG:  Recent Labs Lab 11/07/13 0751 11/07/13 1204 11/07/13 2133 11/08/13 0800 11/08/13 1323  GLUCAP 125* 188* 158* 132* 164*   Studies:  Recent x-ray studies have been reviewed in detail by the Attending Physician  Scheduled Meds:  Scheduled Meds: . ALPRAZolam  0.25 mg Oral BID  . ALPRAZolam  0.5 mg Oral QHS  . cefTRIAXone (ROCEPHIN)  IV  2 g Intravenous Q24H  . collagenase   Topical Daily  . DAPTOmycin (CUBICIN)  IV  500 mg Intravenous Q24H  . dipyridamole-aspirin  1 capsule Oral BID  .  dronabinol  2.5 mg Oral BID AC  . DULoxetine  60 mg Oral Daily  . enoxaparin (LOVENOX) injection  40 mg Subcutaneous Q24H  . feeding supplement (ENSURE COMPLETE)  237 mL Oral TID BM  . ferrous fumarate  1 tablet Oral BID  . ferrous sulfate  325 mg Oral BID WC  . gabapentin  600 mg Oral TID  . insulin aspart  0-9 Units Subcutaneous TID WC  . lisinopril  2.5 mg Oral Daily  . metoprolol tartrate  50 mg Oral BID  . multivitamin with minerals  1 tablet Oral Daily  . omega-3 acid ethyl esters  2 g Oral BID  . OxyCODONE  40 mg Oral  Q12H  . pantoprazole  40 mg Oral Daily  . saccharomyces boulardii  250 mg Oral BID  . senna-docusate  2 tablet Oral BID  . vitamin C  500 mg Oral Daily    Time spent on care of this patient: 25 mins   [facility] T , MD   [facility] Office  [phone] Pager - Text Page per Shea Donald as per below:  On-Call/Text Page:      [email or web address]      password [email or web address]  If 7PM-7AM, please contact night-coverage [email or web address] Password [email or web address] 11/08/2013, 4:07 PM   LOS: 10 days

## 2013-11-09 LAB — GLUCOSE, CAPILLARY
GLUCOSE-CAPILLARY: 147 mg/dL — AB (ref 70–99)
GLUCOSE-CAPILLARY: 146 mg/dL — AB (ref 70–99)
GLUCOSE-CAPILLARY: 186 mg/dL — AB (ref 70–99)
GLUCOSE-CAPILLARY: 144 mg/dL — AB (ref 70–99)
Glucose-Capillary: 137 mg/dL — ABNORMAL HIGH (ref 70–99)
Glucose-Capillary: 162 mg/dL — ABNORMAL HIGH (ref 70–99)

## 2013-11-09 NOTE — Progress Notes (Signed)
Patient Donald [name]      DOB: 1945-11-01      GNO:[id number]   Palliative Medicine Team at [facility] Progress Note    Subjective: Less drowsy today. Able to eat some lunch but appetite remains poor. Still having some nausea, especially with pills.  Denies F/C, SOB.  Pain doing much better today. Slept well last night. Reports feeling much better overall today as compared with yesterday. Moving bowels.     Filed Vitals:   11/09/13 1200  BP: 161/59  Pulse: 86  Temp: 98.6 F (37 C)  Resp: 24   Physical exam: GEN: more alert, but still slightly on drowsy side HEENT: [place], mmm, sclera anicteric CV: RRR LUNGS: CTAB, symm expansion ABD: soft, NT, ND EXT: L AKA   Assessment/Recommendations:  68 yo male with PMHx of CAD, PVD, Requiring fem-pop bypass few years ago complicated by thrombosis and leg ischemia requiring AKA on 10/08/13. 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 Complicated pain and depression with prolonged hospital stay and complications of multiple medical commodities.   1. Pain- Mixed nociceptive and neuropathic pain. May also have psychosocial component as well given that he is having trouble adjsuting to long hospital stay and setbacks. Pain from sacral wound and AKA site. Neuropathic component especially present at AKI site.  - Continue oxycontin 40mg  BID, PRN morphine/oxycodone- one PRN dose in 24h and subjective reports of improved pain.  - Monitor response to increased gabapentin and cymbalta.   2. Depression/Anxiety- On cymbalta. Increased to 60mg  on 7/9. Seen psych in past. Drowsiness improved with benzo's changed to PRN.  May need to consider d/c'ing.    3. Constipation- likely opioid related. LBM 11/09/13  4. Nausea/Anorexia- Prn zofran. Maalox PRN. Appetite very poor and feels sick to his stomach after eating  - marinol 2.5mg  BID started 7/11. Doubt this low dose would contribute much to sedation. May consider uptitration - minimize  meds as able   5. Psychosocial: Met with his wife. Married >20yrs. Formerly worked at [employer]. Has no children, but pets at hoe   6. Spiritual: Strong christian faith and receives almost daily visits from his home pastor. Strong support from his church   7. ACP- Full code, no documentation that I see on file. Will try to have at least basic discussions during hospital stay. Better to be done with wife and when he is more alert.   Time In: 230 Time Out: 250  Greater than 50% of this time was spent counseling and coordinating care related to the above assessment and plan.   Doran Clay D.O.  Palliative Medicine Team at [facility]  Team Phone: [phone]

## 2013-11-09 NOTE — Consult Note (Signed)
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 Transfer from Rehab 7-2 with non healing stump, S/P debridement 7-2. Wound culture grew VRE and E coli.   During the hospital stay the patient developed paroxysmal A fib. Cardiology was consulted. He converted to sinus rhythm. Patient refused anticoagulation.   HPI/Subjective: No new complaints today.    Assessment/Plan:  AKA stump infection - extensive hx of PVD As per primary service Vascular Surgery -S/P I&D 10/29/13 - wound growing E coli and VRE - ID directing abx regimen   Diabetes CBG reasonably controlled at present - no change in tx plan today    Encephalopathy Probably related to pain medication - appears to be steadily improving   Hypertension BP reasonably controlled at this time   A fib pt has refused anticoag - rate controlled   Fecal incontinence after treatment for rectal cancer   Depression Continue with Cymbalta  Severe protein caloric malnutrition Continue with ensure TID  Stage III pressure ulcer Wound care following  Code Status: FULL Family Communication: spoke w/ wife at bedside   DVT prophylaxis: lovenox  Objective: Blood pressure 152/67, pulse 76, temperature 99.2 F (37.3 C), temperature source Oral, resp. rate 18, height 5\' 9"  (1.753 m), weight 70 kg (154 lb 5.2 oz), SpO2 90.00%.  Intake/Output Summary (Last 24 hours) at 11/09/13 1621 Last data filed at 11/09/13 1330  Gross per 24 hour  Intake 1365.83 ml  Output   1200 ml  Net 165.83 ml   Exam: General: No acute respiratory distress Lungs: Clear to auscultation  bilaterally Cardiovascular: Regular rate without murmur gallop or rub  Abdomen: Nontender, nondistended, soft, bowel sounds positive, no rebound, no ascites, no appreciable mass Extremities: No significant cyanosis, clubbing, or edema R LE   Data Reviewed: Basic Metabolic Panel:  Recent Labs Lab 11/05/13 0500 11/05/13 1720  NA  --  137  K  --  4.0  CL  --  97  CO2  --  26  GLUCOSE  --  74  BUN  --  9  CREATININE 1.04 0.99  CALCIUM  --  8.5   Liver Function Tests: No results found for this basename: AST, ALT, ALKPHOS, BILITOT, PROT, ALBUMIN,  in the last 168 hours  CBC:  Recent Labs Lab 11/05/13 1720 11/06/13 0415  WBC 11.7* 10.4  HGB 9.1* 8.9*  HCT 30.1* 28.7*  MCV 85.5 85.9  PLT 512* 496*   Cardiac Enzymes:  Recent Labs Lab 11/06/13 0415  CKTOTAL 104   CBG:  Recent Labs Lab 11/08/13 1323 11/08/13 1657 11/08/13 2148 11/09/13 0756 11/09/13 1215  GLUCAP 164* 147* 146* 137* 186*   Studies:  Recent x-ray studies have been reviewed in detail by the Attending Physician  Scheduled Meds:  Scheduled Meds: . cefTRIAXone (ROCEPHIN)  IV  2 g Intravenous Q24H  . collagenase   Topical Daily  . DAPTOmycin (CUBICIN)  IV  500 mg Intravenous Q24H  . dipyridamole-aspirin  1 capsule Oral BID  . DULoxetine  60 mg Oral Daily  . enoxaparin (LOVENOX) injection  40 mg Subcutaneous Q24H  . feeding supplement (ENSURE COMPLETE)  237 mL Oral TID BM  . ferrous fumarate  1 tablet Oral BID  . ferrous sulfate  325 mg Oral BID WC  . gabapentin  600 mg Oral TID  . insulin aspart  0-9 Units Subcutaneous TID WC  . lisinopril  2.5 mg Oral Daily  . metoprolol tartrate  50 mg Oral BID  . multivitamin with minerals  1 tablet Oral Daily  . omega-3 acid ethyl esters  2 g Oral BID  . OxyCODONE  40 mg Oral Q12H  . pantoprazole  40 mg Oral Daily  . saccharomyces boulardii  250 mg Oral BID  . senna-docusate  2 tablet Oral BID  . vitamin C  500 mg Oral Daily    Time spent on care  of this patient: 25 mins   [facility] T , MD   [facility] Office  [phone] Pager - Text Page per Shea Evans as per below:  On-Call/Text Page:      [email or web address]      password [email or web address]  If 7PM-7AM, please contact night-coverage [email or web address] Password [email or web address] 11/09/2013, 4:21 PM   LOS: 11 days

## 2013-11-09 NOTE — Progress Notes (Signed)
Speech Language Pathology Treatment: Dysphagia  Patient Details Name: Donald [name] MRN: [id number] DOB: [birth year] Today's Date: 11/09/2013 Time: 3235-5732 SLP Time Calculation (min): 24 min  Assessment / Plan / Recommendation Clinical Impression  Pt reports swallow ability to be consistent with prior to admission, he is aware of his esophagram results.  SLP set up breakfast tray for pt to self feed, no indications of severe airway compromise present.  Pt did have occasional throat clearing which he subscribes to being secretions in his pharynx when he wakes in the am.  Pt reports at times he is able to clear secretions.  Advised pt to aspiration precautions, compensation strategies to mitigate his dysphagia/xerostomia including starting meals with liquids and adding extra gravies/sauces to foods.  SLP to sign off as pt appears to be tolerating minimal intake he will accept.     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 Transfer from Rehab 7-2 with non healing stump, S/P debridement 7-2. Wound culture grew VRE and E coli. Patient developed paroxysmal A fib and was transferred back to acute care. Patient relates difficulty with swallowing, food doesn't go down for last few days- esophagram unremarkable.  He has some difficulty clearing throat from secretions per his statement in the am.  SLP follow up after BSE for tolerance and reinforcement of effective strategies.     Pertinent Vitals Afebrile,decreased  SLP Plan  All goals met    Recommendations Diet recommendations: Dysphagia 3 (mechanical soft);Thin liquid Liquids provided via: Straw;Cup Medication Administration: Whole meds with puree Supervision: Patient able to self feed;Staff to assist with self  feeding Compensations: Slow rate;Small sips/bites;Follow solids with liquid              Oral Care Recommendations: Other (Comment) (oral care TID) Follow up Recommendations: Inpatient Rehab Plan: All goals met    Martorell, [place], [facility] SLP [phone]

## 2013-11-09 NOTE — Progress Notes (Addendum)
[facility] [place] and talking this am.  He states he will try and eat something today.   Objective 145/50 80 98.2 F (36.8 C) (Oral) 14 93%  Intake/Output Summary (Last 24 hours) at 11/09/13 4098 Last data filed at 11/09/13 0500  Gross per 24 hour  Intake   1925 ml  Output   1300 ml  Net    625 ml    Left AKA Changes this am by nursing Dressing sleeve clean without drainage   Assessment/Planning: Left AKA  VANCOMYCIN RESISTANT ENTEROCOCCUS ISOLATED continue daptomycin IV along with IV ceftriaxone.   Palliative care  Continue oxycontin 40mg  BID  encourage PRN percocet over morphine IV  Monitor response to increased gabapentin and cymbalta  internal medicine  No new changes post- consult  DVT prophylaxis:  lovenox Severe protein caloric malnutrition  Continue with ensure TID  Speech swallow evaluation Aspiration Risk  Mild  Diet Recommendation Dysphagia 3 (Mechanical Soft);Thin liquid  Liquid Administration via: Cup;Straw  Medication Administration: Whole meds with puree  Supervision: Patient able to self feed;Staff to assist with self feeding  Compensations: Slow rate;Small sips/bites;Follow solids with liquid  Postural Changes and/or Swallow Maneuvers: Out of bed for meals   COLLINS, EMMA MAUREEN 11/09/2013 7:23 AM  Agree with above. The left above-the-knee amputation site was inspected. Medially, there is good granulation. However, laterally they're still some necrotic tissue. He has not tolerated debridement at the bedside reportedly. I will switch his dressing changes to hydrogel to provide a moist wound in the arm it and hopefully promote healing. If he continues to show improvement then we'll continue with the dressing changes, otherwise he could require return to the operating room later in the week for debridement of the open AKA site. I have discussed with him the possibility of requiring a hip  disarticulation currently he is not in favor of considering this.  Continue intravenous antibiotics. (On daptomycin and Rocephin) Continue aggressive nutritional support. (Ensure Complete PO TID, Magic Cup QD with lunch, MVI)  Deitra Mayo, MD, FACS Beeper [phone] 11/09/2013

## 2013-11-09 NOTE — Progress Notes (Signed)
Physical Therapy Treatment Patient Details Name: Donald [name] MRN: [id number] DOB: Jun 27, 1945 Today's Date: 11/09/2013    History of Present Illness 68 yo male s/p Lt aka readmission for CIR for I&D and new sacral wound. HX May 2015 for Lt femoral-popliteal bypass. 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  Follow Up Recommendations  SNF     Equipment Recommendations  Other (comment) (to be determined)    Recommendations for Other Services       Precautions / Restrictions Precautions Precautions: Fall    Mobility  Bed Mobility Overal bed mobility: Needs Assistance Bed Mobility: Supine to Sit;Sit to Sidelying     Supine to sit: Mod assist;HOB elevated Sit to supine: Min assist   General bed mobility comments: Assist to bring trunk up into sitting.  Transfers                    Ambulation/Gait                 Stairs            Wheelchair Mobility    Modified Rankin (Stroke Patients Only)       Balance Overall balance assessment: Needs assistance Sitting-balance support: Single extremity supported;Feet supported Sitting balance-Leahy Scale: Poor Sitting balance - Comments: Pt with posterior and rt lateral lean requiring min guard to max assist to maintain balance. Pt sat EOB x 10 minutes. Pt expressing feeling nervous. Brought and sat in chair in front of pt and had pt come forward and hold onto that chair arm to facilitate anterior wt shift. Pt able to hold this position 20-30 sec and then letting go do to  nervousness. Postural control: Posterior lean;Right lateral lean                          Cognition Arousal/Alertness: Awake/alert Behavior During Therapy: Anxious Overall Cognitive Status: Within Functional Limits for tasks assessed                       Exercises General Exercises - Lower Extremity Heel Slides: AAROM;Right;10 reps;Supine Hip ABduction/ADduction: AAROM;Right;10 reps;Supine Amputee Exercises Hip ABduction/ADduction: AAROM;Left;10 reps;Supine Hip Flexion/Marching: AAROM;Left;10 reps;Supine    General Comments        Pertinent Vitals/Pain VSS    Home Living                      Prior Function            PT Goals (current goals can now be found in the care plan section) Progress towards PT goals: Not progressing toward goals - comment (Pt very anxious)    Frequency  Min 3X/week    PT Plan Current plan remains appropriate    Co-evaluation             End of Session Equipment Utilized During Treatment: Oxygen Activity Tolerance: Patient limited by fatigue;Other (comment) (limited by anxiety) Patient left: in bed;with call bell/phone within reach;with family/visitor present     Time: 0258-5277 PT Time Calculation (min): 23 min  Charges:  $Therapeutic Exercise: 8-22 mins $Therapeutic Activity: 8-22 mins                    G  Codes:      Donald [name] 11/09/2013, 5:41 PM  [facility] PT [phone]

## 2013-11-09 NOTE — Progress Notes (Signed)
Utilization review completed.  

## 2013-11-10 ENCOUNTER — Inpatient Hospital Stay (HOSPITAL_COMMUNITY): Payer: Medicare Other

## 2013-11-10 DIAGNOSIS — T879 Unspecified complications of amputation stump: Secondary | ICD-10-CM

## 2013-11-10 DIAGNOSIS — R630 Anorexia: Secondary | ICD-10-CM

## 2013-11-10 LAB — BASIC METABOLIC PANEL
ANION GAP: 12 (ref 5–15)
BUN: 6 mg/dL (ref 6–23)
CALCIUM: 6.2 mg/dL — AB (ref 8.4–10.5)
CO2: 21 mEq/L (ref 19–32)
Chloride: 108 mEq/L (ref 96–112)
Creatinine, Ser: 0.66 mg/dL (ref 0.50–1.35)
GFR calc Af Amer: >90 mL/min (ref >90)
Glucose, Bld: 112 mg/dL — ABNORMAL HIGH (ref 70–99)
Potassium: 3.1 mEq/L — ABNORMAL LOW (ref 3.7–5.3)
Sodium: 141 mEq/L (ref 137–147)

## 2013-11-10 LAB — GLUCOSE, CAPILLARY
GLUCOSE-CAPILLARY: 215 mg/dL — AB (ref 70–99)
Glucose-Capillary: 137 mg/dL — ABNORMAL HIGH (ref 70–99)
Glucose-Capillary: 150 mg/dL — ABNORMAL HIGH (ref 70–99)
Glucose-Capillary: 158 mg/dL — ABNORMAL HIGH (ref 70–99)

## 2013-11-10 LAB — ALBUMIN: Albumin: 1.1 g/dL — ABNORMAL LOW (ref 3.5–5.2)

## 2013-11-10 LAB — MAGNESIUM: Magnesium: 1.5 mg/dL (ref 1.5–2.5)

## 2013-11-10 MED ORDER — POTASSIUM CHLORIDE 20 MEQ PO PACK
40.0000 meq | PACK | Freq: Once | ORAL | Status: AC
  Administered 2013-11-11: 40 meq via ORAL
  Filled 2013-11-10: qty 2

## 2013-11-10 MED ORDER — FUROSEMIDE 10 MG/ML IJ SOLN
40.0000 mg | Freq: Four times a day (QID) | INTRAMUSCULAR | Status: AC
  Administered 2013-11-10 – 2013-11-11 (×3): 40 mg via INTRAVENOUS
  Filled 2013-11-10 (×2): qty 4

## 2013-11-10 MED ORDER — POTASSIUM CHLORIDE CRYS ER 20 MEQ PO TBCR: 40.0000 meq | EXTENDED_RELEASE_TABLET | Freq: Once | ORAL | Status: DC

## 2013-11-10 MED ORDER — BOOST / RESOURCE BREEZE PO LIQD
1.0000 | Freq: Three times a day (TID) | ORAL | Status: DC
  Administered 2013-11-10: 1 via ORAL

## 2013-11-10 MED ORDER — ONDANSETRON HCL 4 MG/2ML IJ SOLN
4.0000 mg | Freq: Four times a day (QID) | INTRAMUSCULAR | Status: DC
  Administered 2013-11-10 – 2013-11-16 (×22): 4 mg via INTRAVENOUS
  Filled 2013-11-10 (×22): qty 2

## 2013-11-10 MED ORDER — MORPHINE SULFATE 2 MG/ML IJ SOLN
2.0000 mg | INTRAMUSCULAR | Status: DC | PRN
  Administered 2013-11-13 – 2013-11-14 (×5): 2 mg via INTRAVENOUS
  Filled 2013-11-10 (×5): qty 1

## 2013-11-10 MED ORDER — POTASSIUM CHLORIDE 20 MEQ/15ML (10%) PO LIQD
40.0000 meq | Freq: Once | ORAL | Status: AC
  Administered 2013-11-10: 40 meq via ORAL
  Filled 2013-11-10: qty 30

## 2013-11-10 MED ORDER — GLUCERNA SHAKE PO LIQD
237.0000 mL | Freq: Three times a day (TID) | ORAL | Status: DC
  Administered 2013-11-10 – 2013-11-15 (×3): 237 mL via ORAL

## 2013-11-10 MED ORDER — POTASSIUM CHLORIDE CRYS ER 20 MEQ PO TBCR
40.0000 meq | EXTENDED_RELEASE_TABLET | Freq: Once | ORAL | Status: DC
  Filled 2013-11-10: qty 2

## 2013-11-10 NOTE — Clinical Social Work Note (Signed)
CSW left verbal handoff (voicemail) on patient for unit CSW.    Liz Beach MSW, [place], [place], [phone]

## 2013-11-10 NOTE — Progress Notes (Addendum)
  [facility] Progress Note 11/10/2013 7:40 AM  Subjective:  No complaints this am. Soreness with dressing change earlier this morning.   Filed Vitals:   11/10/13 0350  BP: 152/60  Pulse: 79  Temp: 98.4 F (36.9 C)  Resp: 16   Physical Exam: Extremities:  Dressing change this am by nursing. Some granulation tissue medially with some necrotic tissue laterally. No drainage. No odor.    Intake/Output Summary (Last 24 hours) at 11/10/13 0740 Last data filed at 11/10/13 0600  Gross per 24 hour  Intake   1800 ml  Output   1300 ml  Net    500 ml   Assessment:  68 y.o. male is s/p:  Left AKA  Plan: -Non-healing AKA stump: Still with necrotic tissue. Continue bid hydrogel dressing changes. Will likely need further debridement. Dr. Scot Dock to decide whether to take back to OR. Continue IV daptomycin and IV ceftriaxone -Severe protein caloric malnutrition: continue Ensure TID, Magic Cup QD with lunch, multivitamin. Dysphagia 3 diet.  -Palliative care: continue pain control -DVT prophylaxis: Lovenox  Virgina Jock, PA-C [facility] Office: [phone] Pager: [phone] 11/10/2013 7:40 AM  Agree with above. I did some excisional debridement at the bedside today. I stopped when it began to hurt him. There are some areas that are granulating, and other areas that still need work. I believe that there is a 50% chance that this is salvageable if he can do better eating and with continued aggressive wound care. If it continues to make some progress I may take him to the OR Friday for further debridement.   Deitra Mayo, MD, [place] [phone] 11/10/2013

## 2013-11-10 NOTE — Progress Notes (Signed)
Attempted to call report. 2W nurse will call me back. Will continue to monitor. - Soyla Dryer, RN

## 2013-11-10 NOTE — Progress Notes (Signed)
Dr. Wynelle Cleveland notified regarding patient's inability to swallow fish oil pills and potassium pills. Order received to change potassium to liquid. Will continue to monitor. Roselyn Reef Kenn Rekowski,RN

## 2013-11-10 NOTE — Consult Note (Addendum)
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 Transfer from Rehab 7-2 with non healing stump, S/P debridement 7-2. Wound culture grew VRE and E coli.   During the hospital stay the patient developed paroxysmal A fib. Cardiology was consulted. He converted to sinus rhythm. Patient refused anticoagulation.   HPI/Subjective: Constant nausea and poor appetite  Assessment/Plan:  AKA stump infection - extensive hx of PVD As per primary service Vascular Surgery -S/P I&D 10/29/13 - wound growing E coli and VRE - ID directing abx regimen   Diabetes CBG reasonably controlled at present - sugars slightly elevated at lunch - wife brought smoothie for him as his appetite is poor- will cont to follow w/o change in tx plan today    Hypoxia - start IS- check CXR today - ADDENDUM- CXR reveal b/l infiltrates- likely due to pulm edema therefore, I will d/c IVF and start Lasix for 2 doses- reassess and reorder tomorrow if needed- replace K as well with Lasix- already hypokalemic - monitor for signs of pneumonia- check WBC count in AM- already on Daptomycin and Rocephin  Hypokalemia - replacing- check Mg+  in AM in addition to K+  Nausea - change Zofran to Routine rather than PRN  Encephalopathy Probably related to pain medication - appears to be steadily improving   Hypertension BP reasonably controlled at this time - on Lopressor and Lisinopril  A fib Care as per Cardiology - pt has refused anticoag - is on Aggrenox - rate controlled   Fecal incontinence - occurs intermittently - started after treatment for rectal  cancer  Depression Continue with Cymbalta  Severe protein caloric malnutrition Does not want Ensure- try Glucerna which, according to the med rec, he was taking at home  Stage III pressure ulcer Wound care following  Code Status: FULL Family Communication: spoke w/ wife at bedside   DVT prophylaxis: lovenox  Objective: Blood pressure 135/54, pulse 68, temperature 98.4 F (36.9 C), temperature source Oral, resp. rate 14, height 5\' 9"  (1.753 m), weight 70 kg (154 lb 5.2 oz), SpO2 90.00%.  Intake/Output Summary (Last 24 hours) at 11/10/13 1426 Last data filed at 11/10/13 1208  Gross per 24 hour  Intake 1180.83 ml  Output   1050 ml  Net 130.83 ml   Exam: General: No acute respiratory distress Lungs: Clear to auscultation bilaterally- 90 % on 6 L O2 Cardiovascular: Regular rate without murmur gallop or rub  Abdomen: Nontender, nondistended, soft, bowel sounds positive, no rebound, no ascites, no appreciable mass Extremities: No significant cyanosis, clubbing, or edema R LE   Data Reviewed: Basic Metabolic Panel:  Recent Labs Lab 11/05/13 0500 11/05/13 1720 11/10/13 0520  NA  --  137 141  K  --  4.0 3.1*  CL  --  97 108  CO2  --  26 21  GLUCOSE  --  74 112*  BUN  --  9 6  CREATININE 1.04 0.99 0.66  CALCIUM  --  8.5 6.2*  MG  --   --  1.5   Liver Function Tests:  Recent Labs Lab 11/10/13 0520  ALBUMIN 1.1*    CBC:  Recent Labs Lab 11/05/13 1720 11/06/13  0415  WBC 11.7* 10.4  HGB 9.1* 8.9*  HCT 30.1* 28.7*  MCV 85.5 85.9  PLT 512* 496*   Cardiac Enzymes:  Recent Labs Lab 11/06/13 0415  CKTOTAL 104   CBG:  Recent Labs Lab 11/09/13 1215 11/09/13 1639 11/09/13 2055 11/10/13 0805 11/10/13 1201  GLUCAP 186* 144* 162* 137* 215*   Studies:  Recent x-ray studies have been reviewed in detail by the Attending Physician  Scheduled Meds:  Scheduled Meds: . cefTRIAXone (ROCEPHIN)  IV  2 g Intravenous Q24H  . collagenase   Topical Daily  .  DAPTOmycin (CUBICIN)  IV  500 mg Intravenous Q24H  . dipyridamole-aspirin  1 capsule Oral BID  . DULoxetine  60 mg Oral Daily  . enoxaparin (LOVENOX) injection  40 mg Subcutaneous Q24H  . feeding supplement (RESOURCE BREEZE)  1 Container Oral TID BM  . ferrous fumarate  1 tablet Oral BID  . ferrous sulfate  325 mg Oral BID WC  . gabapentin  600 mg Oral TID  . insulin aspart  0-9 Units Subcutaneous TID WC  . lisinopril  2.5 mg Oral Daily  . metoprolol tartrate  50 mg Oral BID  . multivitamin with minerals  1 tablet Oral Daily  . omega-3 acid ethyl esters  2 g Oral BID  . ondansetron  4 mg Intravenous QID  . OxyCODONE  40 mg Oral Q12H  . pantoprazole  40 mg Oral Daily  . saccharomyces boulardii  250 mg Oral BID  . senna-docusate  2 tablet Oral BID  . vitamin C  500 mg Oral Daily    Time spent on care of this patient: 25 mins   Debbe Odea , MD   [facility] Office  [phone] Pager - Text Page per Shea Evans as per below:  On-Call/Text Page:      [email or web address]      password [email or web address]  If 7PM-7AM, please contact night-coverage [email or web address] Password [email or web address] 11/10/2013, 2:26 PM   LOS: 12 days

## 2013-11-10 NOTE — Progress Notes (Signed)
ANTIBIOTIC CONSULT NOTE - FOLLOW UP  Pharmacy Consult for daptomycin Indication: L AKA wound infection/osteomyelitis  Labs:  Recent Labs  11/10/13 0520  CREATININE 0.66   Estimated Creatinine Clearance: 86.3 ml/min (by C-G formula based on Cr of 0.66).  Microbiology: Recent Results (from the past 720 hour(s))  URINE CULTURE     Status: None   Collection Time    10/27/13  5:13 PM      Result Value Ref Range Status   Specimen Description URINE, CLEAN CATCH   Final   Special Requests NONE   Final   Culture  Setup Time     Final   Value: 10/27/2013 19:13     Performed at [place]     Final   Value: >=100,000 COLONIES/ML     Performed at [facility]   Culture     Final   Value: Multiple bacterial morphotypes present, none predominant. Suggest appropriate recollection if clinically indicated.     Performed at [facility]   Report Status 10/28/2013 FINAL   Final  WOUND CULTURE     Status: None   Collection Time    10/29/13 10:25 AM      Result Value Ref Range Status   Specimen Description WOUND LEFT LEG   Final   Special Requests LEFT ABOVE KNEE PT ON VANCO ZOSYN   Final   Gram Stain     Final   Value: ABUNDANT WBC PRESENT, PREDOMINANTLY PMN     NO SQUAMOUS EPITHELIAL CELLS SEEN     MODERATE GRAM POSITIVE COCCI     IN PAIRS FEW GRAM NEGATIVE RODS     Performed at [facility]   Culture     Final   Value: ABUNDANT ESCHERICHIA COLI     MODERATE VANCOMYCIN RESISTANT ENTEROCOCCUS ISOLATED     Note: CRITICAL RESULT CALLED TO, READ BACK BY AND VERIFIED WITH: TRACIE DAVIS@0811  ON [id number] BY [facility]     Performed at [facility]   Report Status 11/02/2013 FINAL   Final   Organism ID, Bacteria ESCHERICHIA COLI   Final   Organism ID, Bacteria VANCOMYCIN RESISTANT ENTEROCOCCUS ISOLATED   Final  ANAEROBIC CULTURE     Status: None   Collection Time    10/29/13 10:25 AM      Result Value Ref Range Status   Specimen  Description WOUND LEFT LEG   Final   Special Requests LEFT ABOVE KNEE PT ON VANCO ZOSYN   Final   Gram Stain     Final   Value: MODERATE WBC PRESENT, PREDOMINANTLY PMN     NO SQUAMOUS EPITHELIAL CELLS SEEN     MODERATE GRAM POSITIVE COCCI     IN PAIRS FEW GRAM NEGATIVE RODS     Performed at [facility]   Culture     Final   Value: NO ANAEROBES ISOLATED     Performed at [facility]   Report Status 11/03/2013 FINAL   Final    Assessment: 68 yo male with left leg wound (noted s/p L AKA on 10/29/13) and cultures showing VRE and E.coli. Pt continues on D#6 of daptomycin. Also on ceftriaxone per ID recommendations. Baseline CK value is 104 - normal. Renal function remains good.   7/9 daptomycin 7/9 rocephin>> 7/6 cipro >>7/9 6/30 vancomycin>> 7/6 6/30 zosyn>> 7/6 6/25 Cephalexin >>6/30  7/2 left leg wound: VRE and E. Coli (pan-S)  Plan:  1. Continue daptomycin 500mg  IV Q24H  2. F/u renal fxn, C&S, clinical status and weekly CK  Salome Arnt, PharmD, BCPS Pager # [phone] 11/10/2013 8:29 AM

## 2013-11-10 NOTE — Progress Notes (Signed)
Report called to Sharyn Lull, RN on [facility]. Preparing to transfer patient. Will continue to monitor. - Soyla Dryer, RN

## 2013-11-10 NOTE — Progress Notes (Signed)
Patient UD:Donald [name]      DOB: [birth year]      VZC:[id number]   Palliative Medicine Team at [facility] Progress Note    Subjective:  Donald [name] reports that pain continues to do better.  Nausea overall a bit better. Usually just occurs when he takes pills large amount of pills now.  Appetite still remains low. Had BM today. Some hypoxia today and CXR obtained. He is not feeling SOB.  No other acute complaints.    Filed Vitals:   11/10/13 1206  BP: 135/54  Pulse: 68  Temp: 98.4 F (36.9 C)  Resp: 14   Physical exam:  GEN: Alertness/mentation improving.  HEENT: [place], mmm, sclera anicteric  CV: RRR  LUNGS: scattered rales ABD: soft, NT, ND  EXT: L AKA, edematous   7/14 CXR- intersitial opacities. Concerning for edema.    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 Complicated pain and depression with prolonged hospital stay and complications of multiple medical commodities.   1. Pain- Mixed nociceptive and neuropathic pain. May also have psychosocial component as well given that he is having trouble adjsuting to long hospital stay and setbacks. Pain from sacral wound and AKA site. Neuropathic component especially present at AKI site.  - Continue oxycontin 48m BID, PRN morphine (IV)/oxycodone. Would keep IV available for wound debridement and encourage oxycodone for intermittent pain.  - Monitor response to increased gabapentin and cymbalta. Thus far seems like pain is improving.   2. Depression/Anxiety- On cymbalta. Increased to 644mon 7/9. Seen psych in past. Drowsiness improved with benzo's changed to PRN. May need to consider d/c'ing completely. I will d/c his PRN dose for during the day.   3. Constipation- likely opioid related. LBM 11/10/13   4. Nausea/Anorexia- Prn zofran. Maalox PRN.  Appetite very poor and feels sick to his stomach after eating  - marinol 2.36m436mID started 7/11. Can consider titrating up. Nausea a bit better overall.  - minimize meds as able   5. Psychosocial: Met with his wife. Married >36y32yrormerly worked at [name] no children, but pets at home  6. Spiritual: Strong christian faith and receives almost daily visits from his home pastor. Strong support from his church   7. ACP- Full code, no documentation that I see on file. Will try to have at least basic discussions during hospital stay. Better to be done with wife and when he is more alert. She is not at bedside today.   Time In: 300  Time Out: 325 Total Time: 25 minutes  Greater than 50% of this time was spent counseling and coordinating care related to the above assessment and plan.

## 2013-11-11 DIAGNOSIS — F329 Major depressive disorder, single episode, unspecified: Secondary | ICD-10-CM

## 2013-11-11 DIAGNOSIS — F3289 Other specified depressive episodes: Secondary | ICD-10-CM

## 2013-11-11 DIAGNOSIS — Z515 Encounter for palliative care: Secondary | ICD-10-CM

## 2013-11-11 DIAGNOSIS — I2510 Atherosclerotic heart disease of native coronary artery without angina pectoris: Secondary | ICD-10-CM

## 2013-11-11 DIAGNOSIS — IMO0002 Reserved for concepts with insufficient information to code with codable children: Secondary | ICD-10-CM

## 2013-11-11 LAB — BASIC METABOLIC PANEL
Anion gap: 14 (ref 5–15)
BUN: 8 mg/dL (ref 6–23)
CHLORIDE: 96 meq/L (ref 96–112)
CO2: 29 mEq/L (ref 19–32)
Calcium: 8.5 mg/dL (ref 8.4–10.5)
Creatinine, Ser: 1.02 mg/dL (ref 0.50–1.35)
GFR calc Af Amer: 87 mL/min — ABNORMAL LOW (ref >90)
GFR calc non Af Amer: 75 mL/min — ABNORMAL LOW (ref >90)
Glucose, Bld: 154 mg/dL — ABNORMAL HIGH (ref 70–99)
POTASSIUM: 3.9 meq/L (ref 3.7–5.3)
Sodium: 139 mEq/L (ref 137–147)

## 2013-11-11 LAB — MAGNESIUM: Magnesium: 1.9 mg/dL (ref 1.5–2.5)

## 2013-11-11 LAB — CBC
HCT: 30.4 % — ABNORMAL LOW (ref 39.0–52.0)
HEMOGLOBIN: 9.4 g/dL — AB (ref 13.0–17.0)
MCH: 26.8 pg (ref 26.0–34.0)
MCHC: 30.9 g/dL (ref 30.0–36.0)
MCV: 86.6 fL (ref 78.0–100.0)
Platelets: 515 10*3/uL — ABNORMAL HIGH (ref 150–400)
RBC: 3.51 MIL/uL — AB (ref 4.22–5.81)
RDW: 19.5 % — ABNORMAL HIGH (ref 11.5–15.5)
WBC: 17.7 10*3/uL — ABNORMAL HIGH (ref 4.0–10.5)

## 2013-11-11 LAB — GLUCOSE, CAPILLARY
GLUCOSE-CAPILLARY: 209 mg/dL — AB (ref 70–99)
GLUCOSE-CAPILLARY: 182 mg/dL — AB (ref 70–99)
Glucose-Capillary: 154 mg/dL — ABNORMAL HIGH (ref 70–99)

## 2013-11-11 NOTE — Progress Notes (Signed)
Patient Donald [name]      DOB: Oct 10, 1945      TGG:[id number]   Palliative Medicine Team at [facility] Progress Note    Subjective: Donald [name] is awake alert, knows he has not seen me before. Introduced myself.  He relates his pain is doing very well.  Still with poor appetite. States he is hot and asked for a cool rag and his fan on.  No family at bedside.  Noted further debridements planned.  Will continue to follow with you.Donald [name] Vitals:   11/11/13 0551  BP: 130/58  Pulse: 94  Temp: 98.7 F (37.1 C)  Resp: 20   Physical exam: General: no acute distress, affect appropraite PERRL, EOMI, anciteric, mmm Chest decreased anteriorly  CVS: regular ate and rhythm Abd: soft ,not tender postive bowel sounds Ext: L AKA, some edema Neuro: awake alert and oriented   Lab Results  Component Value Date   CREATININE 1.02 11/11/2013   BUN 8 11/11/2013   NA 139 11/11/2013   K 3.9 11/11/2013   CL 96 11/11/2013   CO2 29 11/11/2013   Lab Results  Component Value Date   WBC 17.7* 11/11/2013   HGB 9.4* 11/11/2013   HCT 30.4* 11/11/2013   MCV 86.6 11/11/2013   PLT 515* 11/11/2013     Assessment and plan: 68 yr old with PVD s/p AKA after thrombosis and ischemia. Course complicated by metabolic encephalopathy, and pain issues- neuropathic in nature.  1.  Full code remains in place.  2.  Pain: continue doing well with Oxycontin 40mg  q 12, required no additional morphine overnight did use 2 tablets of oxir in the evening.  3. Anxiety agitation:  Benzodiazepines were scheduled but caused over sedation.  Agree with low dose prn at bedtime. No record of needing benzo  4. Depression : continue cymbalta.   Total time: Donald Donald Le, MD MBA The Palliative Medicine Team at [facility] Phone: [phone] Pager: [phone]

## 2013-11-11 NOTE — Consult Note (Signed)
WOC wound follow up Wound type: Stage III pressure ulcer Measurement: distal left buttock : 8cm x 5cm x 0.3cm proximal left buttock: 2.5cm x 2.0cm x 0.3cm.  Two smaller areas central gluteal cleft 1.0cm x 0.2cm x 0.2cm and distal right buttock 2.0cm x 1.0cm x 0.3cm  Wound bed: all are 80% pink with epithelial buds throughout the wound bed with 20% yellow dispersed over the wound bed.  No acute changes with the dc of the hydrotherapy.   Drainage (amount, consistency, odor) minimal serous Periwound: intact, wound contaminated with stool  Dressing procedure/placement/frequency: Continue hydrogel for moist wound healing, change daily. Top with dry dressing. Again asked patient to allow me to place him back on an air mattress for moisture management and for pressure redistribution. He has agreed to go back on air mattress.  I have notified the staff and secretary to order this for patient.  WOC will follow along for weekly wound assessments.  [place], [place]

## 2013-11-11 NOTE — Progress Notes (Signed)
   VASCULAR PROGRESS NOTE  SUBJECTIVE: No specific complaints.   PHYSICAL EXAM: Filed Vitals:   11/10/13 1618 11/10/13 2006 11/11/13 0551 11/11/13 0553  BP: 123/44 124/57 130/58   Pulse: 79 87 94   Temp: 97.6 F (36.4 C) 97.6 F (36.4 C) 98.7 F (37.1 C)   TempSrc: Oral Oral Oral   Resp: 18 18 20    Height:      Weight:      SpO2: 90% 96% 88% 92%   Left AKA wound inspected. Some granulation tissue, but there is still a lot of necrotic tissue.   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 Continue aggressive nutritional support. (Ensure Complete PO TID, Magic Cup QD with lunch, MVI)  * Will need to increase dsg changes to TID and go back to wet to dry to dry. I will take back to OR Friday for further debridement unless the wound looks worse tomorrow. In that case would need to discuss hip disarticulation, which he was against when I initially brought this up.   * Will also see if pulsed lavage can be done at bedside.   * Wound care team following the sacral D3  * Lovenox for DVT prophylaxis.    Gae Gallop Beeper: [phone] 11/11/2013

## 2013-11-11 NOTE — Consult Note (Addendum)
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 Transfer from Rehab 7-2 with non healing stump, S/P debridement 7-2. Wound culture grew VRE and E coli.   During the hospital stay the patient developed paroxysmal A fib. Cardiology was consulted. He converted to sinus rhythm. Patient refused anticoagulation.  getting Iv Abx, wound care  HPI/Subjective: Constant nausea and poor appetite  Assessment/Plan:  AKA stump infection - extensive hx of PVD Per VVS -S/P I&D 10/29/13 - wound growing E coli and VRE - on Ceftriaxone and Daptomycin per ID -Plan for OR Friday  Diabetes CBG reasonably controlled at present  -stable, SSI  Acute Diastolic CHF -improving, diuresed over 3L with IV lasix yesterday -off IVf now -will not diurese more today -wean O2  Hypokalemia - replacing- check Mg+  in AM in addition to K+  Nausea - change Zofran to Routine rather than PRN  Encephalopathy Probably related to pain medication - appears to be steadily improving   Hypertension BP reasonably controlled at this time - on Lopressor and Lisinopril  A fib Care as per Cardiology - pt has refused anticoag - is on Aggrenox - rate controlled   Fecal incontinence - occurs intermittently - started after treatment for rectal cancer  Depression Continue with Cymbalta  Severe protein caloric malnutrition Very poor PO intake glucerna TID also poorly tolerated  Stage III pressure ulcer Wound care following  ETHICS: Prognosis poor given above clinical course, severe malnutrition, very poor PO intake and failure to thrive -Palliative following, i think he needs a Goals of  Care Meeting -  Code Status: FULL Family Communication: spoke w/ wife at bedside   DVT prophylaxis: lovenox  Objective: Blood pressure 116/43, pulse 73, temperature 97.9 F (36.6 C), temperature source Oral, resp. rate 18, height 5\' 9"  (1.753 m), weight 70 kg (154 lb 5.2 oz), SpO2 91.00%.  Intake/Output Summary (Last 24 hours) at 11/11/13 1511 Last data filed at 11/11/13 0954  Gross per 24 hour  Intake    350 ml  Output   3901 ml  Net  -3551 ml   Exam: General: No acute respiratory distress, AAO, chronically ill appearing Lungs: Clear to auscultation bilaterally- 90 % on 6 L O2 Cardiovascular: Regular rate without murmur gallop or rub  Abdomen: Nontender, nondistended, soft, bowel sounds positive, no rebound, no ascites, no appreciable mass Extremities: L AKA with dressing  Data Reviewed: Basic Metabolic Panel:  Recent Labs Lab 11/05/13 0500 11/05/13 1720 11/10/13 0520 11/11/13 0500  NA  --  137 141 139  K  --  4.0 3.1* 3.9  CL  --  97 108 96  CO2  --  26 21 29   GLUCOSE  --  74 112* 154*  BUN  --  9 6 8   CREATININE 1.04 0.99 0.66 1.02  CALCIUM  --  8.5 6.2* 8.5  MG  --   --  1.5 1.9   Liver Function Tests:  Recent Labs Lab 11/10/13 0520  ALBUMIN 1.1*    CBC:  Recent Labs Lab 11/05/13 1720 11/06/13 0415 11/11/13 0500  WBC 11.7* 10.4 17.7*  HGB 9.1* 8.9* 9.4*  HCT 30.1* 28.7* 30.4*  MCV 85.5 85.9 86.6  PLT 512* 496* 515*  Cardiac Enzymes:  Recent Labs Lab 11/06/13 0415  CKTOTAL 104   CBG:  Recent Labs Lab 11/10/13 1201 11/10/13 1628 11/10/13 2131 11/11/13 0650 11/11/13 1122  GLUCAP 215* 150* 158* 154* 209*   Studies:  Recent x-ray studies have been reviewed in detail by the Attending Physician  Scheduled Meds:  Scheduled Meds: . cefTRIAXone (ROCEPHIN)  IV  2 g Intravenous Q24H  . collagenase   Topical Daily  . DAPTOmycin (CUBICIN)  IV  500 mg Intravenous Q24H  . dipyridamole-aspirin  1 capsule Oral BID  . DULoxetine  60 mg  Oral Daily  . enoxaparin (LOVENOX) injection  40 mg Subcutaneous Q24H  . feeding supplement (GLUCERNA SHAKE)  237 mL Oral TID BM  . ferrous fumarate  1 tablet Oral BID  . ferrous sulfate  325 mg Oral BID WC  . gabapentin  600 mg Oral TID  . insulin aspart  0-9 Units Subcutaneous TID WC  . lisinopril  2.5 mg Oral Daily  . metoprolol tartrate  50 mg Oral BID  . multivitamin with minerals  1 tablet Oral Daily  . omega-3 acid ethyl esters  2 g Oral BID  . ondansetron  4 mg Intravenous QID  . OxyCODONE  40 mg Oral Q12H  . pantoprazole  40 mg Oral Daily  . saccharomyces boulardii  250 mg Oral BID  . senna-docusate  2 tablet Oral BID  . vitamin C  500 mg Oral Daily    Time spent on care of this patient: 25 mins   [name] , MD  [phone]  If 7PM-7AM, please contact night-coverage [email or web address] Password [email or web address] 11/11/2013, 3:11 PM   LOS: 13 days

## 2013-11-12 ENCOUNTER — Ambulatory Visit: Payer: Medicare Other | Admitting: Nurse Practitioner

## 2013-11-12 DIAGNOSIS — G547 Phantom limb syndrome without pain: Secondary | ICD-10-CM

## 2013-11-12 DIAGNOSIS — I2510 Atherosclerotic heart disease of native coronary artery without angina pectoris: Secondary | ICD-10-CM

## 2013-11-12 DIAGNOSIS — I2584 Coronary atherosclerosis due to calcified coronary lesion: Secondary | ICD-10-CM

## 2013-11-12 DIAGNOSIS — R110 Nausea: Secondary | ICD-10-CM

## 2013-11-12 LAB — GLUCOSE, CAPILLARY
GLUCOSE-CAPILLARY: 122 mg/dL — AB (ref 70–99)
GLUCOSE-CAPILLARY: 192 mg/dL — AB (ref 70–99)
Glucose-Capillary: 135 mg/dL — ABNORMAL HIGH (ref 70–99)
Glucose-Capillary: 103 mg/dL — ABNORMAL HIGH (ref 70–99)
Glucose-Capillary: 113 mg/dL — ABNORMAL HIGH (ref 70–99)

## 2013-11-12 NOTE — Progress Notes (Signed)
CSW continuing to follow patient. CSW re-faxed out updated clinicals to SNF facilities and will provide bed offers to patient and patient's wife once bed offers are received.   Jeanette Caprice, MSW, [place]

## 2013-11-12 NOTE — Progress Notes (Addendum)
   Subjective  - Comfortable with no new complaints.    Objective 130/79 71 97.4 F (36.3 C) (Oral) 18 94%  Intake/Output Summary (Last 24 hours) at 11/12/13 0725 Last data filed at 11/11/13 2200  Gross per 24 hour  Intake    460 ml  Output   1400 ml  Net   -940 ml   Left AKA with reported necrotic tissue centrally.   Surrounding tissue with some granulation tissue.  Assessment:  Principal Problem:  PAF (paroxysmal atrial fibrillation)  Active Problems:  HYPERLIPIDEMIA  Anal cancer  Peripheral vascular disease  Hemiparesis affecting left side as late effect of stroke  HTN (hypertension)  Diabetes mellitus with circulatory complication  S/P AKA 0/48/88  Non-healing wound of amputation stump  CAD- CTO RCA Sept 2013- medical Rx  History of CVA - 2008  WBC 11/11/2013 now 17.7.  IV antibiotics   Plan: Irrigation and debridement of left AKA stump 11/13/2013 by Dr. Scot Dock Pre-op has been completed NPO past MN CBC and BMET ordered for tomorrow.   Rocephin and Cubicin IV antibiotics.  Laurence Slate [facility] 11/12/2013 7:25 AM  Agree with above. WBC = 17.7 K For debridement of open Left AKA in the [place], MD, [place] [phone] 11/12/2013

## 2013-11-12 NOTE — Progress Notes (Signed)
Occupational Therapy Treatment Patient Details Name: Donald [name] MRN: [id number] DOB: 1946/01/22 Today's Date: 11/12/2013    History of present illness 68 yo male s/p Lt aka readmission for CIR for I&D and new sacral wound. HX May 2015 for Lt femoral-popliteal bypass. pt has hx of Lt side affect CVA, Lt aka 10/09/13 rectal CA (2009) with metastases in liver(2012) , HTN, gout, PVD, Ferd, CAD, and DM2    OT comments  Pt continues to be cooperative and agreeable to therapeutic activities. Pt with flat affect and slow processing today, conversing little.  Pt able to sit EOB x 15 minutes.  Balance remains poor with increased posterior lean initially.  Use of shoe on R foot appeared beneficial.  Continues to require +2 assist for bed mobility to EOB, but pulling up in bed and rolling to L side with +1.    Follow Up Recommendations  SNF    Equipment Recommendations       Recommendations for Other Services      Precautions / Restrictions Precautions Precautions: Fall Precaution Comments: condom cath, VRE, wounds on buttocks       Mobility Bed Mobility Overal bed mobility: Needs Assistance Bed Mobility: Supine to Sit;Sit to Sidelying;Rolling Rolling: Min assist;Mod assist   Supine to sit: +2 for physical assistance;Mod assist   Sit to sidelying: +2 for physical assistance;Min assist    Transfers                      Balance Overall balance assessment: Needs assistance Sitting-balance support: Feet supported;Single extremity supported Sitting balance-Leahy Scale: Poor Sitting balance - Comments: pt with tendency for posterior lean, heavy reliance on R UE, sat 15 minutes, occasional myoclonus especially with fatigue Postural control: Posterior lean                         ADL Overall ADL's : Needs assistance/impaired     Grooming: Wash/dry face;Minimal assistance;Bed level (pt did not initially appear to know what to do with washclot)                        Toileting- Clothing Manipulation and Hygiene: Bed level;Total assistance Toileting - Clothing Manipulation Details (indicate cue type and reason): unaware of bowel movement       General ADL Comments: Pt's sitting balance remains poor, requiring R UE to assist him in balancing.  Tolerated 15 minutes at EOB with min to max assist.       Vision                     Perception     Praxis      Cognition   Behavior During Therapy: Flat affect Overall Cognitive Status: Impaired/Different from baseline Area of Impairment: Following commands        Following Commands: Follows one step commands with increased time       General Comments: pt generally slow to respond today, not conversing as usual per wife    Extremity/Trunk Assessment               Exercises     Shoulder Instructions       General Comments      Pertinent Vitals/ Pain       Did not complain of pain, VSS  Home Living  Prior Functioning/Environment              Frequency Min 2X/week     Progress Toward Goals  OT Goals(current goals can now be found in the care plan section)  Progress towards OT goals: Progressing toward goals     Plan Discharge plan remains appropriate    Co-evaluation    PT/OT/SLP Co-Evaluation/Treatment: Yes Reason for Co-Treatment: Complexity of the patient's impairments (multi-system involvement);For patient/therapist safety   OT goals addressed during session: Strengthening/ROM      End of Session     Activity Tolerance Patient tolerated treatment well   Patient Left in bed;with call bell/phone within reach;with family/visitor present   Nurse Communication  (sacral dressing soiled from BM)        Time: 4128-7867 OT Time Calculation (min): 46 min  Charges: OT General Charges $OT Visit: 1 Procedure OT Treatments $Therapeutic Activity: 23-37 mins  Malka So 11/12/2013, 3:45 PM [phone]

## 2013-11-12 NOTE — Progress Notes (Signed)
NUTRITION FOLLOW UP  DOCUMENTATION CODES  Per approved criteria   -Severe malnutrition in the context of chronic illness    Intervention:    Continue Glucerna Shake po TID, each supplement provides 220 kcal and 10 grams of protein  Continue Magic Cup dessert supplement & MVI daily  If supportive care desired, recommend enteral nutrition support initiation  RD to follow for nutrition care plan  Nutrition Dx:   Increased nutrient needs related to wounds as evidenced by estimated calorie and protein needs, ongoing  Goal:  Intake to meet >90% of estimated nutrition needs, progressing  Monitor:  PO & supplemental intake, weight, labs, I/O's  Assessment:   68 y.o. male who underwent a left above-the-knee amputation. He was then transferred to rehabilitation. On 7/1 it was noted that he had significant breakdown of his above-the-knee amputation. Transferred to hospital (acute side) on 7/2 for wound debridement. Intraoperatively he was found to have significant necrotic muscle tissue in addition to gross purulence. The wound was aggressively debrided and the bone was shortened. The wound was packed.  7/9:  Receiving hydrotherapy to left buttock wound. Left buttock wound is improving per WOC note, evolution into stage 3 pressure ulcers from unstageable pressure ulcers.  Patient c/o difficulty swallowing; his wife reports that he chews up his food, then spits it out. Intake is not adequate to meet increased nutrition needs to support wound healing.  7/16:  Patient continues with poor appetite.  Per wife, drank some of a smoothie she brought in.  PO intake variable at 10-100% per flowsheet records.  S/p bedside swallow evaluation 7/10.  Diet changed to Dys 3, thin liquids.  Marinol started 7/11.  CWOCN note reviewed 7/15.  Plan is for OR tomorrow for I&D of left AKA.  Height: Ht Readings from Last 1 Encounters:  10/29/13 5\' 9"  (1.753 m)    Weight Status:   Wt Readings from Last 1  Encounters:  11/02/13 154 lb 5.2 oz (70 kg)   10/29/13  151 lb 10.8 oz (68.8 kg)   10/13/13  165 lb 10.4 oz (75.137 kg)  Re-estimated needs:  Kcal: 2000-2200  Protein: 100-120 gm  Fluid: 2-2.2 L  Skin:  left thigh amputation site wound Stage III pressure ulcer with associated MASD to left buttock  Diet Order: Dysphagia 3, thin liquids  Supplements: Glucerna Shake TID between meals, Magic Cup once daily with a meal, MVI daily   Intake/Output Summary (Last 24 hours) at 11/12/13 1218 Last data filed at 11/11/13 2200  Gross per 24 hour  Intake    340 ml  Output    800 ml  Net   -460 ml    Labs:   Recent Labs Lab 11/05/13 1720 11/10/13 0520 11/11/13 0500  NA 137 141 139  K 4.0 3.1* 3.9  CL 97 108 96  CO2 26 21 29   BUN 9 6 8   CREATININE 0.99 0.66 1.02  CALCIUM 8.5 6.2* 8.5  MG  --  1.5 1.9  GLUCOSE 74 112* 154*    CBG (last 3)   Recent Labs  11/11/13 2200 11/12/13 0621 11/12/13 1128  GLUCAP 122* 135* 192*    Scheduled Meds: . cefTRIAXone (ROCEPHIN)  IV  2 g Intravenous Q24H  . collagenase   Topical Daily  . DAPTOmycin (CUBICIN)  IV  500 mg Intravenous Q24H  . dipyridamole-aspirin  1 capsule Oral BID  . DULoxetine  60 mg Oral Daily  . enoxaparin (LOVENOX) injection  40 mg Subcutaneous Q24H  .  feeding supplement (GLUCERNA SHAKE)  237 mL Oral TID BM  . ferrous fumarate  1 tablet Oral BID  . ferrous sulfate  325 mg Oral BID WC  . gabapentin  600 mg Oral TID  . insulin aspart  0-9 Units Subcutaneous TID WC  . lisinopril  2.5 mg Oral Daily  . metoprolol tartrate  50 mg Oral BID  . multivitamin with minerals  1 tablet Oral Daily  . omega-3 acid ethyl esters  2 g Oral BID  . ondansetron  4 mg Intravenous QID  . OxyCODONE  40 mg Oral Q12H  . pantoprazole  40 mg Oral Daily  . saccharomyces boulardii  250 mg Oral BID  . senna-docusate  2 tablet Oral BID  . vitamin C  500 mg Oral Daily    Continuous Infusions:    Arthur Holms, RD, LDN Pager #:  [phone] After-Hours Pager #: [phone]

## 2013-11-12 NOTE — Progress Notes (Signed)
Patient OZ:Donald [name]      DOB: Mar 07, 1946      KVQ:[id number]   Palliative Medicine Team at [facility] Progress Note    Subjective: Patient relates he is in some pain this am but reports when he gets medications that it improves. His appetite remains poor but he states his wife might bring him something from home.  He again reports the pills that he is taking make him feel sick. Patient scheduled for further debridement in am. Filed Vitals:   11/12/13 0954  BP: 112/62  Pulse:   Temp:   Resp:    Physical exam: General: no acute distress , awake alert oriented to self PERRL,EOMI, anicteric, mmm Chest decreased but clear CVS; regular, S1, S2 Abd: soft, not tender or distended Ext: left amputation dressed Neuro: affect flat , but oriented and awake    Assessment and plan:67 yr old with PVD s/p AKA after thrombosis and ischemia. Course complicated by metabolic encephalopathy, and pain issues- neuropathic in nature. For wound revision tomorrow. Will continue to work through his goals as we see how he does with the procedure. Agree with scheduling zofran.  1. Full code remains in place.  2. Pain: continue doing well with Oxycontin 40mg  q 12, required no additional morphine overnight did use 2 tablets of oxir in the evening.  3. Anxiety agitation: Benzodiazepines were scheduled but caused over sedation. Agree with low dose prn at bedtime. No record of needing benzo  4. Depression : continue cymbalta 5. Nausea: Zofran has been scheduled.  Donald Virnig L. Lovena Le, MD MBA The Palliative Medicine Team at [facility] Phone: [phone] Pager: [phone]  Total time: 15 min

## 2013-11-12 NOTE — Consult Note (Signed)
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 Transfer from Rehab 7-2 with non healing stump, S/P debridement 7-2. Wound culture grew VRE and E coli.   During the hospital stay the patient developed paroxysmal A fib. Cardiology was consulted. He converted to sinus rhythm. Patient refused anticoagulation.  getting Iv Abx, wound care  HPI/Subjective: Constant nausea and poor appetite, today tells me that he wants to get better  Assessment/Plan:  AKA stump infection - extensive hx of PVD -Per VVS -S/P I&D 10/29/13 - wound growing E coli and VRE - on Ceftriaxone and Daptomycin per ID -Plan for OR Friday  Diabetes CBG reasonably controlled at present  -stable, SSI  Acute Diastolic CHF -improving, diuresed over 3L with IV lasix 7/14 -off IVf now -will not diurese more today -wean O2  Hypokalemia - replacing- check Mg+  in AM in addition to K+  Nausea - change Zofran to Routine rather than PRN  Encephalopathy -Probably related to pain medication - appears to be steadily improving   Hypertension BP reasonably controlled at this time - on Lopressor and Lisinopril  A fib Care as per Cardiology - pt has refused anticoag - is on Aggrenox - rate controlled   Fecal incontinence - occurs intermittently - started after treatment for rectal cancer  Depression Continue with Cymbalta  Severe protein caloric malnutrition Very poor PO intake glucerna TID also poorly tolerated  Stage III pressure ulcer Wound care following  ETHICS: Prognosis poor given above clinical course, severe malnutrition, very poor PO intake and failure to thrive -Palliative  following, i think he needs a Goals of Care Meeting if continues to have failure to thrive   Code Status: FULL Family Communication: spoke w/ wife at bedside   DVT prophylaxis: lovenox  Objective: Blood pressure 112/62, pulse 71, temperature 97.4 F (36.3 C), temperature source Oral, resp. rate 18, height 5\' 9"  (1.753 m), weight 70 kg (154 lb 5.2 oz), SpO2 94.00%.  Intake/Output Summary (Last 24 hours) at 11/12/13 1318 Last data filed at 11/11/13 2200  Gross per 24 hour  Intake    220 ml  Output    800 ml  Net   -580 ml   Exam: General: No acute respiratory distress, AAO, chronically ill appearing Lungs: Clear to auscultation bilaterally- 90 % on 6 L O2 Cardiovascular: Regular rate without murmur gallop or rub  Abdomen: Nontender, nondistended, soft, bowel sounds positive, no rebound, no ascites, no appreciable mass Extremities: L AKA with dressing  Data Reviewed: Basic Metabolic Panel:  Recent Labs Lab 11/05/13 1720 11/10/13 0520 11/11/13 0500  NA 137 141 139  K 4.0 3.1* 3.9  CL 97 108 96  CO2 26 21 29   GLUCOSE 74 112* 154*  BUN 9 6 8   CREATININE 0.99 0.66 1.02  CALCIUM 8.5 6.2* 8.5  MG  --  1.5 1.9   Liver Function Tests:  Recent Labs Lab 11/10/13 0520  ALBUMIN 1.1*    CBC:  Recent Labs Lab 11/05/13 1720 11/06/13 0415 11/11/13 0500  WBC 11.7* 10.4 17.7*  HGB 9.1* 8.9* 9.4*  HCT 30.1* 28.7* 30.4*  MCV 85.5 85.9 86.6  PLT 512* 496* 515*   Cardiac Enzymes:  Recent Labs Lab 11/06/13 0415  CKTOTAL 104   CBG:  Recent Labs Lab 11/11/13 1122 11/11/13 1634 11/11/13 2200 11/12/13 0621 11/12/13 1128  GLUCAP 209* 182* 122* 135* 192*   Studies:  Recent x-ray studies have been reviewed in detail by the Attending Physician  Scheduled Meds:  Scheduled Meds: . cefTRIAXone (ROCEPHIN)  IV  2 g Intravenous Q24H  . collagenase   Topical Daily  . DAPTOmycin (CUBICIN)  IV  500 mg Intravenous Q24H  . dipyridamole-aspirin  1 capsule Oral BID  .  DULoxetine  60 mg Oral Daily  . enoxaparin (LOVENOX) injection  40 mg Subcutaneous Q24H  . feeding supplement (GLUCERNA SHAKE)  237 mL Oral TID BM  . ferrous fumarate  1 tablet Oral BID  . ferrous sulfate  325 mg Oral BID WC  . gabapentin  600 mg Oral TID  . insulin aspart  0-9 Units Subcutaneous TID WC  . lisinopril  2.5 mg Oral Daily  . metoprolol tartrate  50 mg Oral BID  . multivitamin with minerals  1 tablet Oral Daily  . omega-3 acid ethyl esters  2 g Oral BID  . ondansetron  4 mg Intravenous QID  . OxyCODONE  40 mg Oral Q12H  . pantoprazole  40 mg Oral Daily  . saccharomyces boulardii  250 mg Oral BID  . senna-docusate  2 tablet Oral BID  . vitamin C  500 mg Oral Daily    Time spent on care of this patient: 25 mins   Donald [name] , MD  [phone]  If 7PM-7AM, please contact night-coverage [email or web address] Password [email or web address] 11/12/2013, 1:18 PM   LOS: 14 days

## 2013-11-12 NOTE — Progress Notes (Signed)
Physical Therapy Treatment Patient Details Name: Donald [name] MRN: [id number] DOB: 1946-01-21 Today's Date: Nov 29, 2013    History of Present Illness 68 yo male s/p Lt aka readmission for CIR for I&D and new sacral wound. HX May 2015 for Lt femoral-popliteal bypass. 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   Follow Up Recommendations  SNF     Equipment Recommendations  Other (comment) (to be determined)    Recommendations for Other Services       Precautions / Restrictions Precautions Precautions: Fall Precaution Comments: condom cath, VRE, wounds on buttocks    Mobility  Bed Mobility Overal bed mobility: Needs Assistance Bed Mobility: Supine to Sit;Sit to Sidelying;Rolling Rolling: Min assist;Mod assist   Supine to sit: +2 for physical assistance;Mod assist   Sit to sidelying: +2 for physical assistance;Min assist    Transfers                    Ambulation/Gait                 Stairs            Wheelchair Mobility    Modified Rankin (Stroke Patients Only)       Balance Overall balance assessment: Needs assistance Sitting-balance support: Feet supported;Single extremity supported Sitting balance-Leahy Scale: Poor Sitting balance - Comments: pt with tendency for posterior lean, heavy reliance on R UE, sat 15 minutes, occasional myoclonus especially with fatigue Postural control: Posterior lean                          Cognition Arousal/Alertness: Awake/alert Behavior During Therapy: Flat affect Overall Cognitive Status: Impaired/Different from baseline Area of Impairment: Following commands       Following Commands: Follows one step commands with increased time       General Comments: pt generally slow to respond today, not conversing as usual per wife    Exercises       General Comments        Pertinent Vitals/Pain VSS    Home Living                      Prior Function            PT Goals (current goals can now be found in the care plan section) Progress towards PT goals: Progressing toward goals (very slowly)    Frequency  Min 2X/week    PT Plan Current plan remains appropriate;Frequency needs to be updated    Co-evaluation PT/OT/SLP Co-Evaluation/Treatment: Yes Reason for Co-Treatment: Complexity of the patient's impairments (multi-system involvement);For patient/therapist safety PT goals addressed during session: Mobility/safety with mobility;Balance;Strengthening/ROM OT goals addressed during session: Strengthening/ROM     End of Session Equipment Utilized During Treatment: Oxygen Activity Tolerance: Patient limited by fatigue Patient left: in bed;with call bell/phone within reach;with family/visitor present     Time: 1430-1516 PT Time Calculation (min): 46 min  Charges:  $Therapeutic Activity: 23-37 mins                    G Codes:      Donald [name] 11/29/2013, 4:52 PM  [facility] PT [phone]

## 2013-11-13 ENCOUNTER — Encounter (HOSPITAL_COMMUNITY)
Admission: AD | Disposition: A | Discharge status: Hospice | Payer: Self-pay | Source: Ambulatory Visit | Attending: Vascular Surgery

## 2013-11-13 ENCOUNTER — Inpatient Hospital Stay (HOSPITAL_COMMUNITY): Payer: Medicare Other | Admitting: Anesthesiology

## 2013-11-13 ENCOUNTER — Encounter (HOSPITAL_COMMUNITY): Payer: Medicare Other | Admitting: Anesthesiology

## 2013-11-13 DIAGNOSIS — T8189XA Other complications of procedures, not elsewhere classified, initial encounter: Secondary | ICD-10-CM

## 2013-11-13 HISTORY — PX: I&D EXTREMITY: SHX5045

## 2013-11-13 LAB — BASIC METABOLIC PANEL
Anion gap: 14 (ref 5–15)
BUN: 11 mg/dL (ref 6–23)
CHLORIDE: 93 meq/L — AB (ref 96–112)
CO2: 30 meq/L (ref 19–32)
Calcium: 8.1 mg/dL — ABNORMAL LOW (ref 8.4–10.5)
Creatinine, Ser: 1 mg/dL (ref 0.50–1.35)
GFR calc Af Amer: 87 mL/min — ABNORMAL LOW (ref >90)
GFR calc non Af Amer: 75 mL/min — ABNORMAL LOW (ref >90)
GLUCOSE: 152 mg/dL — AB (ref 70–99)
POTASSIUM: 4 meq/L (ref 3.7–5.3)
SODIUM: 137 meq/L (ref 137–147)

## 2013-11-13 LAB — CBC
HCT: 31.1 % — ABNORMAL LOW (ref 39.0–52.0)
HEMOGLOBIN: 9.2 g/dL — AB (ref 13.0–17.0)
MCH: 26.6 pg (ref 26.0–34.0)
MCHC: 29.6 g/dL — ABNORMAL LOW (ref 30.0–36.0)
MCV: 89.9 fL (ref 78.0–100.0)
PLATELETS: 589 10*3/uL — AB (ref 150–400)
RBC: 3.46 MIL/uL — AB (ref 4.22–5.81)
RDW: 19.4 % — ABNORMAL HIGH (ref 11.5–15.5)
WBC: 12.9 10*3/uL — AB (ref 4.0–10.5)

## 2013-11-13 LAB — GLUCOSE, CAPILLARY
GLUCOSE-CAPILLARY: 146 mg/dL — AB (ref 70–99)
GLUCOSE-CAPILLARY: 208 mg/dL — AB (ref 70–99)
Glucose-Capillary: 141 mg/dL — ABNORMAL HIGH (ref 70–99)
Glucose-Capillary: 146 mg/dL — ABNORMAL HIGH (ref 70–99)
Glucose-Capillary: 164 mg/dL — ABNORMAL HIGH (ref 70–99)
Glucose-Capillary: 153 mg/dL — ABNORMAL HIGH (ref 70–99)

## 2013-11-13 LAB — CK: Total CK: 33 U/L (ref 7–232)

## 2013-11-13 SURGERY — IRRIGATION AND DEBRIDEMENT EXTREMITY
Anesthesia: General | Site: Leg Upper | Laterality: Left

## 2013-11-13 MED ORDER — FENTANYL CITRATE 0.05 MG/ML IJ SOLN
25.0000 ug | INTRAMUSCULAR | Status: DC | PRN
  Administered 2013-11-13 (×3): 25 ug via INTRAVENOUS

## 2013-11-13 MED ORDER — LIDOCAINE HCL (CARDIAC) 20 MG/ML IV SOLN
INTRAVENOUS | Status: DC | PRN
  Administered 2013-11-13: 60 mg via INTRAVENOUS

## 2013-11-13 MED ORDER — SODIUM CHLORIDE 0.9 % IJ SOLN
INTRAMUSCULAR | Status: AC
Start: 2013-11-13 — End: 2013-11-13
  Filled 2013-11-13: qty 10

## 2013-11-13 MED ORDER — ONDANSETRON HCL 4 MG/2ML IJ SOLN: 4.0000 mg | Freq: Once | INTRAMUSCULAR | Status: DC | PRN

## 2013-11-13 MED ORDER — LACTATED RINGERS IV SOLN
INTRAVENOUS | Status: DC | PRN
  Administered 2013-11-13: 08:00:00 via INTRAVENOUS

## 2013-11-13 MED ORDER — ARTIFICIAL TEARS OP OINT
TOPICAL_OINTMENT | OPHTHALMIC | Status: AC
  Filled 2013-11-13: qty 3.5

## 2013-11-13 MED ORDER — FENTANYL CITRATE 0.05 MG/ML IJ SOLN
INTRAMUSCULAR | Status: DC | PRN
  Administered 2013-11-13: 50 ug via INTRAVENOUS
  Administered 2013-11-13: 100 ug via INTRAVENOUS

## 2013-11-13 MED ORDER — ROCURONIUM BROMIDE 50 MG/5ML IV SOLN
INTRAVENOUS | Status: AC
  Filled 2013-11-13: qty 1

## 2013-11-13 MED ORDER — EPHEDRINE SULFATE 50 MG/ML IJ SOLN
INTRAMUSCULAR | Status: AC
  Filled 2013-11-13: qty 1

## 2013-11-13 MED ORDER — LIDOCAINE HCL (CARDIAC) 20 MG/ML IV SOLN
INTRAVENOUS | Status: AC
  Filled 2013-11-13: qty 10

## 2013-11-13 MED ORDER — PROPOFOL 10 MG/ML IV BOLUS
INTRAVENOUS | Status: AC
  Filled 2013-11-13: qty 20

## 2013-11-13 MED ORDER — SUCCINYLCHOLINE CHLORIDE 20 MG/ML IJ SOLN
INTRAMUSCULAR | Status: AC
  Filled 2013-11-13: qty 1

## 2013-11-13 MED ORDER — FENTANYL CITRATE 0.05 MG/ML IJ SOLN
INTRAMUSCULAR | Status: AC
  Filled 2013-11-13: qty 5

## 2013-11-13 MED ORDER — ONDANSETRON HCL 4 MG/2ML IJ SOLN
INTRAMUSCULAR | Status: AC
  Filled 2013-11-13: qty 2

## 2013-11-13 MED ORDER — LIDOCAINE HCL (CARDIAC) 20 MG/ML IV SOLN
INTRAVENOUS | Status: AC
  Filled 2013-11-13: qty 5

## 2013-11-13 MED ORDER — FENTANYL CITRATE 0.05 MG/ML IJ SOLN
INTRAMUSCULAR | Status: AC
  Administered 2013-11-13: 25 ug via INTRAVENOUS
  Filled 2013-11-13: qty 2

## 2013-11-13 MED ORDER — PROPOFOL 10 MG/ML IV BOLUS
INTRAVENOUS | Status: DC | PRN
  Administered 2013-11-13 (×3): 50 mg via INTRAVENOUS

## 2013-11-13 MED ORDER — 0.9 % SODIUM CHLORIDE (POUR BTL) OPTIME
TOPICAL | Status: DC | PRN
  Administered 2013-11-13: 1000 mL

## 2013-11-13 MED ORDER — PHENYLEPHRINE HCL 10 MG/ML IJ SOLN
INTRAMUSCULAR | Status: DC | PRN
  Administered 2013-11-13: 180 ug via INTRAVENOUS

## 2013-11-13 MED ORDER — PHENYLEPHRINE 40 MCG/ML (10ML) SYRINGE FOR IV PUSH (FOR BLOOD PRESSURE SUPPORT)
PREFILLED_SYRINGE | INTRAVENOUS | Status: AC
  Filled 2013-11-13: qty 10

## 2013-11-13 SURGICAL SUPPLY — 38 items
BANDAGE ELASTIC 4 VELCRO ST LF (GAUZE/BANDAGES/DRESSINGS) IMPLANT
BANDAGE ELASTIC 6 VELCRO ST LF (GAUZE/BANDAGES/DRESSINGS) ×2 IMPLANT
BANDAGE GAUZE ELAST BULKY 4 IN (GAUZE/BANDAGES/DRESSINGS) ×6 IMPLANT
BLADE 10 SAFETY STRL DISP (BLADE) ×3 IMPLANT
BLADE SAW RECIP 87.9 MT (BLADE) ×2 IMPLANT
CANISTER SUCTION 2500CC (MISCELLANEOUS) ×3 IMPLANT
COVER SURGICAL LIGHT HANDLE (MISCELLANEOUS) ×3 IMPLANT
DRAPE INCISE IOBAN 66X45 STRL (DRAPES) IMPLANT
DRAPE ORTHO SPLIT 77X108 STRL (DRAPES) ×6
DRAPE PROXIMA HALF (DRAPES) ×3 IMPLANT
DRAPE SURG ORHT 6 SPLT 77X108 (DRAPES) IMPLANT
ELECT REM PT RETURN 9FT ADLT (ELECTROSURGICAL) ×3
ELECTRODE REM PT RTRN 9FT ADLT (ELECTROSURGICAL) ×1 IMPLANT
GLOVE BIO SURGEON STRL SZ 6.5 (GLOVE) ×1 IMPLANT
GLOVE BIO SURGEON STRL SZ7.5 (GLOVE) ×3 IMPLANT
GLOVE BIO SURGEONS STRL SZ 6.5 (GLOVE) ×1
GLOVE BIOGEL PI IND STRL 6.5 (GLOVE) ×1 IMPLANT
GLOVE BIOGEL PI IND STRL 8 (GLOVE) ×1 IMPLANT
GLOVE BIOGEL PI INDICATOR 6.5 (GLOVE) ×2
GLOVE BIOGEL PI INDICATOR 8 (GLOVE) ×2
GLOVE SURG SS PI 6.0 STRL IVOR (GLOVE) ×2 IMPLANT
GOWN STRL REUS W/ TWL LRG LVL3 (GOWN DISPOSABLE) ×3 IMPLANT
GOWN STRL REUS W/TWL LRG LVL3 (GOWN DISPOSABLE) ×9
KIT BASIN OR (CUSTOM PROCEDURE TRAY) ×3 IMPLANT
KIT ROOM TURNOVER OR (KITS) ×3 IMPLANT
NS IRRIG 1000ML POUR BTL (IV SOLUTION) ×3 IMPLANT
PACK CV ACCESS (CUSTOM PROCEDURE TRAY) IMPLANT
PACK GENERAL/GYN (CUSTOM PROCEDURE TRAY) IMPLANT
PAD ARMBOARD 7.5X6 YLW CONV (MISCELLANEOUS) ×6 IMPLANT
SPONGE GAUZE 4X4 12PLY (GAUZE/BANDAGES/DRESSINGS) ×3 IMPLANT
SUT ETHILON 3 0 PS 1 (SUTURE) IMPLANT
SUT VIC AB 2-0 CTB1 (SUTURE) IMPLANT
SUT VIC AB 3-0 SH 27 (SUTURE)
SUT VIC AB 3-0 SH 27X BRD (SUTURE) IMPLANT
SUT VICRYL 4-0 PS2 18IN ABS (SUTURE) IMPLANT
TOWEL OR 17X24 6PK STRL BLUE (TOWEL DISPOSABLE) ×3 IMPLANT
TOWEL OR 17X26 10 PK STRL BLUE (TOWEL DISPOSABLE) ×3 IMPLANT
WATER STERILE IRR 1000ML POUR (IV SOLUTION) ×3 IMPLANT

## 2013-11-13 NOTE — Progress Notes (Signed)
ANTIBIOTIC CONSULT NOTE - FOLLOW UP  Pharmacy Consult for daptomycin Indication: L AKA wound infection/osteomyelitis  Labs:  Recent Labs  11/11/13 0500 11/13/13 0530  WBC 17.7* 12.9*  HGB 9.4* 9.2*  PLT 515* 589*  CREATININE 1.02 1.00   Estimated Creatinine Clearance: 66.7 ml/min (by C-G formula based on Cr of 1).  Microbiology: Recent Results (from the past 720 hour(s))  URINE CULTURE     Status: None   Collection Time    10/27/13  5:13 PM      Result Value Ref Range Status   Specimen Description URINE, CLEAN CATCH   Final   Special Requests NONE   Final   Culture  Setup Time     Final   Value: 10/27/2013 19:13     Performed at [place]     Final   Value: >=100,000 COLONIES/ML     Performed at [facility]   Culture     Final   Value: Multiple bacterial morphotypes present, none predominant. Suggest appropriate recollection if clinically indicated.     Performed at [facility]   Report Status 10/28/2013 FINAL   Final  WOUND CULTURE     Status: None   Collection Time    10/29/13 10:25 AM      Result Value Ref Range Status   Specimen Description WOUND LEFT LEG   Final   Special Requests LEFT ABOVE KNEE PT ON VANCO ZOSYN   Final   Gram Stain     Final   Value: ABUNDANT WBC PRESENT, PREDOMINANTLY PMN     NO SQUAMOUS EPITHELIAL CELLS SEEN     MODERATE GRAM POSITIVE COCCI     IN PAIRS FEW GRAM NEGATIVE RODS     Performed at [facility]   Culture     Final   Value: ABUNDANT ESCHERICHIA COLI     MODERATE VANCOMYCIN RESISTANT ENTEROCOCCUS ISOLATED     Note: CRITICAL RESULT CALLED TO, READ BACK BY AND VERIFIED WITH: TRACIE DAVIS@[id number]  ON [id number] BY [facility]     Performed at [facility]   Report Status 11/02/2013 FINAL   Final   Organism ID, Bacteria ESCHERICHIA COLI   Final   Organism ID, Bacteria VANCOMYCIN RESISTANT ENTEROCOCCUS ISOLATED   Final  ANAEROBIC CULTURE     Status: None   Collection Time    10/29/13  10:25 AM      Result Value Ref Range Status   Specimen Description WOUND LEFT LEG   Final   Special Requests LEFT ABOVE KNEE PT ON VANCO ZOSYN   Final   Gram Stain     Final   Value: MODERATE WBC PRESENT, PREDOMINANTLY PMN     NO SQUAMOUS EPITHELIAL CELLS SEEN     MODERATE GRAM POSITIVE COCCI     IN PAIRS FEW GRAM NEGATIVE RODS     Performed at [facility]   Culture     Final   Value: NO ANAEROBES ISOLATED     Performed at [facility]   Report Status 11/03/2013 FINAL   Final    Assessment: 68 yo male with left leg wound (noted s/p L AKA on 10/29/13) and cultures showing VRE and E.coli. Pt continues on D#9 of daptomycin. Also on ceftriaxone per ID recommendations. Baseline CK value is 104 - normal. Renal function remains good. S/p I&D today (7/17). CK 33   7/9 daptomycin 7/9 rocephin>> 7/6 cipro >>7/9 6/30 vancomycin>> 7/6 6/30 zosyn>> 7/6 6/25  Cephalexin >>6/30  7/2 left leg wound: VRE and E. Coli (pan-S)  Plan:  1. Continue daptomycin 500mg  IV Q24H 2. F/u renal fxn, C&S, clinical status and weekly CK  Albertina Parr, PharmD.  Clinical Pharmacist Pager [phone]

## 2013-11-13 NOTE — Consult Note (Signed)
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 Transfer from Rehab 7-2 with non healing stump, S/P debridement 7-2. Wound culture grew VRE and E coli.  During the hospital stay the patient developed paroxysmal A fib. Cardiology was consulted. He converted to sinus rhythm. Patient refused anticoagulation.  getting IV Abx, wound care  HPI/Subjective: Back from OR, sleepy  Assessment/Plan:  AKA stump infection - extensive hx of PVD -Per VVS -S/P I&D 10/29/13 - wound growing E coli and VRE - on Ceftriaxone and Daptomycin per ID, for 8 weeks -s/p debridement of L AKA per Dr.Dickson today  Diabetes CBG reasonably controlled at present  -stable, SSI  Acute Diastolic CHF -improving, had diuresed over 3L with IV lasix 7/14 -off IVf now -back on 4L O2, will check CXR  Hypokalemia - improved  Encephalopathy -Probably related to pain medication  -appears to be steadily improving   Hypertension BP reasonably controlled at this time - on Lopressor and Lisinopril  A fib - as per Cardiology - pt has refused anticoag - is on Aggrenox - rate controlled, cotninue toprol  Fecal incontinence - occurs intermittently - started after treatment for rectal cancer  Depression Continue with Cymbalta  Severe protein caloric malnutrition Very poor PO intake glucerna TID also poorly tolerated  Stage III pressure ulcer Wound care following  ETHICS: Prognosis poor given above clinical course, severe malnutrition, very poor PO intake and failure to thrive -Palliative following, i think he needs a Goals of Care Meeting if continues to have failure to thrive  Code  Status: FULL Family Communication: spoke w/ wife at bedside   DVT prophylaxis: lovenox  Objective: Blood pressure 130/71, pulse 84, temperature 97.7 F (36.5 C), temperature source Oral, resp. rate 18, height 5\' 9"  (1.753 m), weight 67.586 kg (149 lb), SpO2 96.00%.  Intake/Output Summary (Last 24 hours) at 11/13/13 1451 Last data filed at 11/13/13 0925  Gross per 24 hour  Intake    520 ml  Output    400 ml  Net    120 ml   Exam: General: No acute respiratory distress, AAO, chronically ill appearing Lungs: Clear to auscultation bilaterally- 90 % on 6 L O2 Cardiovascular: Regular rate without murmur gallop or rub  Abdomen: Nontender, nondistended, soft, bowel sounds positive, no rebound, no ascites, no appreciable mass Extremities: L AKA with dressing  Data Reviewed: Basic Metabolic Panel:  Recent Labs Lab 11/10/13 0520 11/11/13 0500 11/13/13 0530  NA 141 139 137  K 3.1* 3.9 4.0  CL 108 96 93*  CO2 21 29 30   GLUCOSE 112* 154* 152*  BUN 6 8 11   CREATININE 0.66 1.02 1.00  CALCIUM 6.2* 8.5 8.1*  MG 1.5 1.9  --    Liver Function Tests:  Recent Labs Lab 11/10/13 0520  ALBUMIN 1.1*    CBC:  Recent Labs Lab 11/11/13 0500 11/13/13 0530  WBC 17.7* 12.9*  HGB 9.4* 9.2*  HCT 30.4* 31.1*  MCV 86.6 89.9  PLT 515* 589*   Cardiac Enzymes:  Recent Labs Lab 11/13/13 0530  CKTOTAL 33   CBG:  Recent Labs Lab 11/12/13 2119 11/13/13 0609 11/13/13 0815 11/13/13 0933 11/13/13 1223  GLUCAP 113* 141* 146* 146* 164*   Studies:  Recent x-ray studies have been reviewed in detail by the Attending Physician  Scheduled Meds:  Scheduled Meds: . cefTRIAXone (ROCEPHIN)  IV  2 g Intravenous Q24H  . collagenase   Topical Daily  . DAPTOmycin (CUBICIN)  IV  500 mg Intravenous Q24H  . dipyridamole-aspirin  1 capsule Oral BID  . DULoxetine  60 mg Oral Daily  . enoxaparin (LOVENOX) injection  40 mg Subcutaneous Q24H  . feeding supplement (GLUCERNA SHAKE)  237 mL Oral  TID BM  . ferrous fumarate  1 tablet Oral BID  . ferrous sulfate  325 mg Oral BID WC  . gabapentin  600 mg Oral TID  . insulin aspart  0-9 Units Subcutaneous TID WC  . lisinopril  2.5 mg Oral Daily  . metoprolol tartrate  50 mg Oral BID  . multivitamin with minerals  1 tablet Oral Daily  . omega-3 acid ethyl esters  2 g Oral BID  . ondansetron  4 mg Intravenous QID  . OxyCODONE  40 mg Oral Q12H  . pantoprazole  40 mg Oral Daily  . saccharomyces boulardii  250 mg Oral BID  . senna-docusate  2 tablet Oral BID  . vitamin C  500 mg Oral Daily    Time spent on care of this patient: 25 mins   [name] , MD  [phone]  If 7PM-7AM, please contact night-coverage [email or web address] Password [email or web address] 11/13/2013, 2:51 PM   LOS: 15 days

## 2013-11-13 NOTE — Interval H&P Note (Signed)
History and Physical Interval Note:  11/13/2013 7:30 AM  Donald [name]  has presented today for surgery, with the diagnosis of Non-healing surgical wound, initial encounter   The various methods of treatment have been discussed with the patient and family. After consideration of risks, benefits and other options for treatment, the patient has consented to  Procedure(s): IRRIGATION AND DEBRIDEMENT OF ABOVE KNEE AMPUTATION (Left) as a surgical intervention .  The patient's history has been reviewed, patient examined, no change in status, stable for surgery.  I have reviewed the patient's chart and labs.  Questions were answered to the patient's satisfaction.     Blain Hunsucker S

## 2013-11-13 NOTE — Progress Notes (Addendum)
Dressing changed per orders. Orders verified by RN with PA to start TID changes today. Normal saline applied to loosen packing. Moderate bleeding present on medial side of stump.  Bleeding stopped and wet Kerlix packing reapplied.  RN not able to pack as deep as found from OR.  Dry Kerlix and 6" ACE wrap reapplied per orders. Stockinette placed on top of ACE. No tape was used to fasten stockinette.

## 2013-11-13 NOTE — Anesthesia Postprocedure Evaluation (Signed)
  Anesthesia Post-op Note  Patient: Donald [name]  Procedure(s) Performed: Procedure(s): IRRIGATION AND DEBRIDEMENT OF ABOVE KNEE AMPUTATION  (Left)  Patient Location: PACU  Anesthesia Type:General  Level of Consciousness: awake  Airway and Oxygen Therapy: Patient Spontanous Breathing and Patient connected to nasal cannula oxygen  Post-op Pain: mild  Post-op Assessment: Post-op Vital signs reviewed, Patient's Cardiovascular Status Stable, Respiratory Function Stable, Patent Airway and No signs of Nausea or vomiting  Post-op Vital Signs: Reviewed and stable  Last Vitals:  Filed Vitals:   11/13/13 1100  BP: 130/69  Pulse: 87  Temp: 36.5 C  Resp: 18    Complications: No apparent anesthesia complications

## 2013-11-13 NOTE — Anesthesia Preprocedure Evaluation (Signed)
Anesthesia Evaluation  Patient identified by MRN, date of birth, ID band Patient awake    Reviewed: Allergy & Precautions, H&P , NPO status , Patient's Chart, lab work & pertinent test results  History of Anesthesia Complications Negative for: history of anesthetic complications  Airway Mallampati: II TM Distance: >3 FB     Dental  (+) Poor Dentition   Pulmonary neg sleep apnea, neg COPDRecent URI , Residual Cough, former smoker,          Cardiovascular hypertension, Pt. on medications + CAD, + Cardiac Stents and + Peripheral Vascular Disease + dysrhythmias Atrial Fibrillation Rhythm:Regular     Neuro/Psych PSYCHIATRIC DISORDERS Depression CVA, Residual Symptoms    GI/Hepatic Neg liver ROS, GERD-  ,  Endo/Other  diabetes, Insulin Dependent  Renal/GU      Musculoskeletal   Abdominal   Peds  Hematology  (+) anemia ,   Anesthesia Other Findings   Reproductive/Obstetrics                           Anesthesia Physical Anesthesia Plan  ASA: III  Anesthesia Plan: General   Post-op Pain Management:    Induction: Intravenous  Airway Management Planned: Oral ETT  Additional Equipment: None  Intra-op Plan:   Post-operative Plan: Extubation in OR  Informed Consent: I have reviewed the patients History and Physical, chart, labs and discussed the procedure including the risks, benefits and alternatives for the proposed anesthesia with the patient or authorized representative who has indicated his/her understanding and acceptance.   Dental advisory given  Plan Discussed with: CRNA and Surgeon  Anesthesia Plan Comments:         Anesthesia Quick Evaluation

## 2013-11-13 NOTE — H&P (View-Only) (Signed)
   Subjective  - Comfortable with no new complaints.    Objective 130/79 71 97.4 F (36.3 C) (Oral) 18 94%  Intake/Output Summary (Last 24 hours) at 11/12/13 0725 Last data filed at 11/11/13 2200  Gross per 24 hour  Intake    460 ml  Output   1400 ml  Net   -940 ml   Left AKA with reported necrotic tissue centrally.   Surrounding tissue with some granulation tissue.  Assessment:  Principal Problem:  PAF (paroxysmal atrial fibrillation)  Active Problems:  HYPERLIPIDEMIA  Anal cancer  Peripheral vascular disease  Hemiparesis affecting left side as late effect of stroke  HTN (hypertension)  Diabetes mellitus with circulatory complication  S/P AKA 0/03/70  Non-healing wound of amputation stump  CAD- CTO RCA Sept 2013- medical Rx  History of CVA - 2008  WBC 11/11/2013 now 17.7.  IV antibiotics   Plan: Irrigation and debridement of left AKA stump 11/13/2013 by Dr. Scot Dock Pre-op has been completed NPO past MN CBC and BMET ordered for tomorrow.   Rocephin and Cubicin IV antibiotics.  Laurence Slate [facility] 11/12/2013 7:25 AM  Agree with above. WBC = 17.7 K For debridement of open Left AKA in the [place], MD, [place] [phone] 11/12/2013

## 2013-11-13 NOTE — Op Note (Signed)
    NAME: Donald [name]   MRN: [id number] DOB: 11/20/1945    DATE OF OPERATION: 11/13/2013  PREOP DIAGNOSIS: nonhealing left above-the-knee amputation  POSTOP DIAGNOSIS: same  PROCEDURE: debridement left above-the-knee amputation  SURGEON: Judeth Cornfield. Scot Dock, MD, FACS  ASSIST: Silva Bandy, [facility]  ANESTHESIA: Gen.   EBL: minimal  INDICATIONS: Donald [name] is a 68 y.o. male who presents for debridement of his nonhealing left above-the-knee amputation.  FINDINGS: there is significant necrotic tissue and muscle which were sharply debrided. This included skin, subcutaneous tissue, muscle, fascia, and the femur was shortened approximately 3 cm.  TECHNIQUE: The patient was taken to the operating room and received a general anesthetic. The left lower extremity was prepped and draped in usual sterile fashion. There were areas of skin which did not appear viable and I excised an ellipse of skin circumferentially back to healthy appearing skin. Hemostasis was obtained using electrocautery. I then sharply debrided subcutaneous tissue, fascia and muscle that was clearly nonviable. Periosteum was then elevated to shorten the bone and this was done using a saw. I removed approximately 3 additional centimeters of the femur. There were some areas of the wound the tract along the muscle planes up towards the groin and these were debrided and then packed with moist saline. Hemostasis was obtained the wound and the wounds packed with moistened Kerlix and a sterile dressing was applied. The patient tolerated the procedure well and was transferred to the recovery room in stable condition. All needle and sponge counts were correct.  Deitra Mayo, MD, FACS [facility]  DATE OF DICTATION:   11/13/2013

## 2013-11-13 NOTE — Transfer of Care (Signed)
Immediate Anesthesia Transfer of Care Note  Patient: Donald [name]  Procedure(s) Performed: Procedure(s): IRRIGATION AND DEBRIDEMENT OF ABOVE KNEE AMPUTATION  (Left)  Patient Location: PACU  Anesthesia Type:General  Level of Consciousness: awake, alert  and oriented  Airway & Oxygen Therapy: Patient Spontanous Breathing and Patient connected to nasal cannula oxygen  Post-op Assessment: Report given to PACU RN and Post -op Vital signs reviewed and stable  Post vital signs: Reviewed and stable  Complications: No apparent anesthesia complications

## 2013-11-13 NOTE — Consult Note (Signed)
WOC wound follow up Asked to reevaluate patient for ways to keep stump dressing in place and less likely to be contaminated from stool Wound type: surgical s/p amputation with multiple debridements Dressing in place from the OR with ACE and hypafix tape in place very secure.  Stockinet added over dressing which was what I would have suggested and actually brought for use to patient room.  I have also closed off one leg of a pair of mesh underwear for use if the current dressing does not work.    WOC follows this patient at least weekly for his sacral and buttock ulcers. Will follow along with you.  Pt is well known to this [place] and has been a patient of mine since some time last year.  He is very tearful today and talks openly with me today about no more surgeries and no more pain. He is very appreciative of the care I have provided to him over the last year.  He questions his faith and that the "lord should have taken me by now"  I offered prayer and support to him and assured him we would continue to try to make him comfortable.  Wife met with me in hallway and questions meeting with palliative care.  It appears they have been involved but possibly not met with the wife before.  I will touch base with the palliative care MD on call  [place] [phone]

## 2013-11-14 ENCOUNTER — Inpatient Hospital Stay (HOSPITAL_COMMUNITY): Payer: Medicare Other

## 2013-11-14 ENCOUNTER — Encounter (HOSPITAL_COMMUNITY): Payer: Self-pay

## 2013-11-14 DIAGNOSIS — I739 Peripheral vascular disease, unspecified: Secondary | ICD-10-CM

## 2013-11-14 LAB — CBC
HCT: 28 % — ABNORMAL LOW (ref 39.0–52.0)
Hemoglobin: 8.4 g/dL — ABNORMAL LOW (ref 13.0–17.0)
MCH: 26 pg (ref 26.0–34.0)
MCHC: 30 g/dL (ref 30.0–36.0)
MCV: 86.7 fL (ref 78.0–100.0)
Platelets: 569 10*3/uL — ABNORMAL HIGH (ref 150–400)
RBC: 3.23 MIL/uL — AB (ref 4.22–5.81)
RDW: 19.5 % — ABNORMAL HIGH (ref 11.5–15.5)
WBC: 14.9 10*3/uL — ABNORMAL HIGH (ref 4.0–10.5)

## 2013-11-14 LAB — BLOOD GAS, ARTERIAL
ACID-BASE EXCESS: 7.8 mmol/L — AB (ref 0.0–2.0)
BICARBONATE: 31.9 meq/L — AB (ref 20.0–24.0)
Drawn by: 23588
O2 Content: 6 L/min
O2 Saturation: 94.8 %
PO2 ART: 71.2 mmHg — AB (ref 80.0–100.0)
Patient temperature: 98.6
TCO2: 33.3 mmol/L (ref 0–100)
pCO2 arterial: 45.8 mmHg — ABNORMAL HIGH (ref 35.0–45.0)
pH, Arterial: 7.457 — ABNORMAL HIGH (ref 7.350–7.450)

## 2013-11-14 LAB — GLUCOSE, CAPILLARY
GLUCOSE-CAPILLARY: 156 mg/dL — AB (ref 70–99)
Glucose-Capillary: 206 mg/dL — ABNORMAL HIGH (ref 70–99)
Glucose-Capillary: 176 mg/dL — ABNORMAL HIGH (ref 70–99)
Glucose-Capillary: 110 mg/dL — ABNORMAL HIGH (ref 70–99)

## 2013-11-14 LAB — BASIC METABOLIC PANEL
ANION GAP: 12 (ref 5–15)
BUN: 10 mg/dL (ref 6–23)
CO2: 30 meq/L (ref 19–32)
CREATININE: 0.9 mg/dL (ref 0.50–1.35)
Calcium: 8.4 mg/dL (ref 8.4–10.5)
Chloride: 94 mEq/L — ABNORMAL LOW (ref 96–112)
GFR calc Af Amer: >90 mL/min (ref >90)
GFR calc non Af Amer: 85 mL/min — ABNORMAL LOW (ref >90)
GLUCOSE: 165 mg/dL — AB (ref 70–99)
Potassium: 4.2 mEq/L (ref 3.7–5.3)
SODIUM: 136 meq/L — AB (ref 137–147)

## 2013-11-14 MED ORDER — FUROSEMIDE 10 MG/ML IJ SOLN
40.0000 mg | Freq: Once | INTRAMUSCULAR | Status: AC
  Administered 2013-11-14: 40 mg via INTRAVENOUS
  Filled 2013-11-14: qty 4

## 2013-11-14 MED ORDER — NALOXONE HCL 0.4 MG/ML IJ SOLN: 0.4000 mg | INTRAMUSCULAR | Status: DC | PRN

## 2013-11-14 MED ORDER — MORPHINE SULFATE 4 MG/ML IJ SOLN
3.0000 mg | Freq: Three times a day (TID) | INTRAMUSCULAR | Status: DC | PRN
  Administered 2013-11-15 (×2): 2 mg via INTRAVENOUS
  Filled 2013-11-14 (×2): qty 1

## 2013-11-14 MED ORDER — SODIUM CHLORIDE 0.9 % IJ SOLN: 9.0000 mL | INTRAMUSCULAR | Status: DC | PRN

## 2013-11-14 MED ORDER — SODIUM CHLORIDE 0.9 % IJ SOLN
10.0000 mL | INTRAMUSCULAR | Status: DC | PRN
  Administered 2013-11-14: 10 mL

## 2013-11-14 MED ORDER — IOHEXOL 350 MG/ML SOLN
100.0000 mL | Freq: Once | INTRAVENOUS | Status: AC | PRN
  Administered 2013-11-14: 100 mL via INTRAVENOUS

## 2013-11-14 MED ORDER — DRONABINOL 2.5 MG PO CAPS
2.5000 mg | ORAL_CAPSULE | Freq: Every day | ORAL | Status: DC
  Administered 2013-11-14 – 2013-11-15 (×2): 2.5 mg via ORAL
  Filled 2013-11-14 (×2): qty 1

## 2013-11-14 NOTE — Consult Note (Signed)
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 Transfer from Rehab 7-2 with non healing stump, S/P debridement 7-2. Wound culture grew VRE and E coli.  During the hospital stay the patient developed paroxysmal A fib. Cardiology was consulted. He converted to sinus rhythm. Patient refused anticoagulation.  getting IV Abx, wound care Underwent L AKA 7/17  HPI/Subjective: Tired, L leg painful, didn't eat breakfast  Assessment/Plan:  AKA stump infection - extensive hx of PVD -Per [facility] -S/P I&D 10/29/13 - wound growing E coli and VRE - on Ceftriaxone and Daptomycin per ID, for 8 weeks -s/p debridement of L AKA 7/17 -continue wound care, declined this today  Diabetes CBG reasonably controlled at present  -stable, SSI  Acute Diastolic CHF -improving, had diuresed over 3L with IV lasix 7/14 -now on 2L O2, will repeat CXR today  Hypokalemia - improved  Encephalopathy -Probably related to pain medication  -appears to be steadily improving   Hypertension -BP reasonably controlled at this time - on Lopressor and Lisinopril  A fib - as per Cardiology - pt has refused anticoag - is on Aggrenox - rate controlled, cotninue toprol  Dysphagia  -on D3 diet per Speech recs  Fecal incontinence - occurs intermittently - started after treatment for rectal cancer  Depression -Continue with Cymbalta  Severe protein caloric malnutrition Very poor PO intake glucerna TID also poorly tolerated  Stage III pressure ulcer Wound care following  ETHICS: Prognosis poor given above clinical course, severe malnutrition, very poor PO intake and failure to  thrive -Palliative following, i think he needs a Goals of Care Meeting if continues to have failure to thrive  Code Status: FULL Family Communication:none at bedside   DVT prophylaxis: lovenox  Objective: Blood pressure 124/53, pulse 78, temperature 98.2 F (36.8 C), temperature source Oral, resp. rate 17, height 5\' 9"  (1.753 m), weight 67.586 kg (149 lb), SpO2 93.00%.  Intake/Output Summary (Last 24 hours) at 11/14/13 1006 Last data filed at 11/14/13 0800  Gross per 24 hour  Intake    100 ml  Output    350 ml  Net   -250 ml   Exam: General: No acute respiratory distress, AAO, chronically ill appearing Lungs: Clear to auscultation bilaterally- 90 % on 6 L O2 Cardiovascular: Regular rate without murmur gallop or rub  Abdomen: Nontender, nondistended, soft, bowel sounds positive, no rebound, no ascites, no appreciable mass Extremities: L AKA with dressing  Data Reviewed: Basic Metabolic Panel:  Recent Labs Lab 11/10/13 0520 11/11/13 0500 11/13/13 0530 11/14/13 0432  NA 141 139 137 136*  K 3.1* 3.9 4.0 4.2  CL 108 96 93* 94*  CO2 21 29 30 30   GLUCOSE 112* 154* 152* 165*  BUN 6 8 11 10   CREATININE 0.66 1.02 1.00 0.90  CALCIUM 6.2* 8.5 8.1* 8.4  MG 1.5 1.9  --   --    Liver Function Tests:  Recent Labs Lab 11/10/13 0520  ALBUMIN 1.1*    CBC:  Recent Labs Lab 11/11/13 0500 11/13/13 0530 11/14/13 0432  WBC 17.7* 12.9* 14.9*  HGB 9.4* 9.2* 8.4*  HCT 30.4* 31.1* 28.0*  MCV 86.6 89.9 86.7  PLT 515* 589* 569*   Cardiac Enzymes:  Recent  Labs Lab 11/13/13 0530  CKTOTAL 33   CBG:  Recent Labs Lab 11/13/13 0933 11/13/13 1223 11/13/13 1615 11/13/13 2207 11/14/13 0617  GLUCAP 146* 164* 153* 208* 156*   Studies:  Recent x-ray studies have been reviewed in detail by the Attending Physician  Scheduled Meds:  Scheduled Meds: . cefTRIAXone (ROCEPHIN)  IV  2 g Intravenous Q24H  . collagenase   Topical Daily  . DAPTOmycin (CUBICIN)  IV  500 mg  Intravenous Q24H  . dipyridamole-aspirin  1 capsule Oral BID  . dronabinol  2.5 mg Oral QAC lunch  . DULoxetine  60 mg Oral Daily  . enoxaparin (LOVENOX) injection  40 mg Subcutaneous Q24H  . feeding supplement (GLUCERNA SHAKE)  237 mL Oral TID BM  . ferrous fumarate  1 tablet Oral BID  . ferrous sulfate  325 mg Oral BID WC  . gabapentin  600 mg Oral TID  . insulin aspart  0-9 Units Subcutaneous TID WC  . lisinopril  2.5 mg Oral Daily  . metoprolol tartrate  50 mg Oral BID  . multivitamin with minerals  1 tablet Oral Daily  . omega-3 acid ethyl esters  2 g Oral BID  . ondansetron  4 mg Intravenous QID  . OxyCODONE  40 mg Oral Q12H  . pantoprazole  40 mg Oral Daily  . saccharomyces boulardii  250 mg Oral BID  . senna-docusate  2 tablet Oral BID  . vitamin C  500 mg Oral Daily    Time spent on care of this patient: 25 mins   [name] , MD  [phone]  If 7PM-7AM, please contact night-coverage [email or web address] Password [email or web address] 11/14/2013, 10:06 AM   LOS: 16 days

## 2013-11-14 NOTE — Progress Notes (Signed)
Pt's sats dropped to 84% on 6 liters of oxygen via [place]. Pt intermittently confused. Pt placed on 45 % venti for a short time and sats came up to 97-99% on venti. Pt weaned back down to 6 liters [place] and sats are now 97-100%. Continuous pulse ox attached and MD paged and aware, new orders given. Will continue to monitor.

## 2013-11-14 NOTE — Progress Notes (Signed)
Patient JY:[id number] R Lacock      DOB: 04/07/1946      ZHY:[id number]   Palliative Medicine Team at [facility] Progress Note    Subjective: Patient states not having a good day.  Pain increased due to surgical debridement yesterday. He did report that he ate better today.  Reviewed with nursing, pain meds held due to low sats, and low BP.  Patient also started on marinol.  Educated nursing on continung oral long acting meds and using prn and giving neurontin.     Filed Vitals:   11/14/13 0457  BP: 138/60  Pulse: 79  Temp: 98.2 F (36.8 C)  Resp: 16   Physical exam: General : no acute distress, pale PERRL, EOMI, anicteric, mmm Chest decreased with some basilar crackles, no wheezing CVS: regular, S1 S2 Abd soft, not tender Ext: left AKA not examined Neuro : awake , somewhat confused but only intermittently     Assessment and plan: 68 yr old white male with ischemia to left limb, s/p AKA now with debridement of wound.  Had been doing reasonably will with pain control but had to go back to get debridement.  Pain meds which were held due to low oxygen and low BP.  Chest xray suggest perhaps some volume overload.  Lasix has just been given. Would like to see if lasix helps , have asked for nursing not to hold meds.   1.  Full code, will likely need to revisit this with the patient as his condition is not improving   2.  Pain: continue oxycontin, ir and Neurontin.  Asked not to hold dosing unless he was unresponsive.  3.  Dressing changes are going to be a problem.  Patient getting Morphine 4 mg with change.  Patient very sensitive to Benzo otherwise would consider adding small dose of versed- believe patient will have adverse effects if we did this.  Will have to do the best we can.  4.  Hypoxia without distress: agree with lasix.  Reeval.   Total time:  300 pm-320 pm  Malea Swilling L. Lovena Le, MD MBA The Palliative Medicine Team at [facility] Phone: [phone] Pager:  [phone]

## 2013-11-14 NOTE — Progress Notes (Addendum)
   Subjective  -" I don't want my leg touch this morning."  Objective 124/53 78 98.2 F (36.8 C) (Oral) 17 93%  Intake/Output Summary (Last 24 hours) at 11/14/13 2197 Last data filed at 11/14/13 0305  Gross per 24 hour  Intake    500 ml  Output    350 ml  Net    150 ml   Dressing in place Patient refused dressing change this am  Assessment/Planning: POD #1  Repeat I and D left AKA stump open wound wound growing E coli and VRE - on Ceftriaxone and Daptomycin per ID, for 8 weeks Palliative care: Oxycontin 40mg  q 12, PRN Morphine Anxiety agitation: Benzodiazepines were scheduled but caused over sedation. Agree with low dose prn at bedtime. No record of needing benzo   Depression : continue cymbalta   Nausea: Zofran has been scheduled.  Laurence Slate [facility] 11/14/2013 7:21 AM  Agree with above.  WBC = 14.9 (was 17.7 on 11/11/13).  He was reluctant to have his dressing changed because of pain. I explained to him that I think the only chance he has a surviving this is with aggressive wound care. Given his axillofemoral bypass graft, I do not think he is a candidate for a left hip disarticulation. This would necessitate removal of the graft and also amputation on the right. That combined with hip disarticulation would be associated with significant risk of operative mortality. Therefore, I have explained to him that the only options are continued aggressive wound care versus comfort measures only. He was agreeable to let us change his dressing after we gave him some additional pain medicine this morning. He tolerated this reasonably well although I was unable to pack the wound as deeply as I would like. We will put him on a morphine PCA and also give him 3-6 mg of morphine before dressing changes. He will continue his intravenous antibiotics and we will encourage him to continue the. His diet is being supplemented as per nutrition's recommendations.  Deitra Mayo, MD,  [place] [phone] 11/14/2013

## 2013-11-14 NOTE — Progress Notes (Signed)
Pt refused evening dressing change. Will continue to monitor.

## 2013-11-14 NOTE — Progress Notes (Signed)
Pt's O2 sats were low and respirations low. Requiring more Oxygen to maintain o2 sats.  Scheduled pain med held and orders given to d/c PCA at this time per Dr. Broadus John. Will continue to monitor.

## 2013-11-15 DIAGNOSIS — I999 Unspecified disorder of circulatory system: Secondary | ICD-10-CM

## 2013-11-15 DIAGNOSIS — R1084 Generalized abdominal pain: Secondary | ICD-10-CM

## 2013-11-15 DIAGNOSIS — E43 Unspecified severe protein-calorie malnutrition: Secondary | ICD-10-CM

## 2013-11-15 LAB — GLUCOSE, CAPILLARY
Glucose-Capillary: 129 mg/dL — ABNORMAL HIGH (ref 70–99)
Glucose-Capillary: 136 mg/dL — ABNORMAL HIGH (ref 70–99)

## 2013-11-15 MED ORDER — GABAPENTIN 300 MG PO CAPS
600.0000 mg | ORAL_CAPSULE | Freq: Two times a day (BID) | ORAL | Status: DC
Start: 2013-11-16 — End: 2013-11-16
  Administered 2013-11-16: 600 mg via ORAL
  Filled 2013-11-15 (×2): qty 2

## 2013-11-15 MED ORDER — DULOXETINE HCL 20 MG PO CPEP
20.0000 mg | ORAL_CAPSULE | Freq: Every day | ORAL | Status: DC
  Administered 2013-11-16: 20 mg via ORAL
  Filled 2013-11-15: qty 1

## 2013-11-15 MED ORDER — ALPRAZOLAM 0.5 MG PO TABS: 1.0000 mg | ORAL_TABLET | Freq: Every day | ORAL | Status: DC

## 2013-11-15 MED ORDER — MORPHINE SULFATE 4 MG/ML IJ SOLN
6.0000 mg | INTRAMUSCULAR | Status: DC | PRN
  Administered 2013-11-15 – 2013-11-16 (×2): 6 mg via INTRAVENOUS
  Filled 2013-11-15 (×2): qty 2

## 2013-11-15 MED ORDER — MORPHINE SULFATE 4 MG/ML IJ SOLN: 4.0000 mg | INTRAMUSCULAR | Status: DC | PRN

## 2013-11-15 MED ORDER — FUROSEMIDE 10 MG/ML IJ SOLN
40.0000 mg | Freq: Two times a day (BID) | INTRAMUSCULAR | Status: DC
  Administered 2013-11-15 – 2013-11-16 (×3): 40 mg via INTRAVENOUS
  Filled 2013-11-15 (×5): qty 4

## 2013-11-15 NOTE — Progress Notes (Signed)
Clinical Education officer, museum (CSW) gave updated bed offers to patient and left list in room. CSW contacted patient's wife Bethena Roys and gave updated bed offers. Wife reported she wants to pursue CIR. CSW encouraged wife to look over bed offers. Wife verbalized her understanding.   Blima Rich, [place] Weekend CSW [phone]

## 2013-11-15 NOTE — Progress Notes (Signed)
Pt still refusing dressing change. I explained importance of doing dressing change. Pt still continues to refuse to have dressing changed. Will continue to encourage patient and assess.

## 2013-11-15 NOTE — Progress Notes (Signed)
Palliative MD called per wife's request to discuss goals of care. Meeting time set up today for 2:30pm. Will continue to monitor.

## 2013-11-15 NOTE — Progress Notes (Addendum)
   Subjective  - Requested IV morphine prior to dressing change.  He had an episode of hypoxemia and was placed on O2 [place] with f/u chest x-ray.  He was confused for a while yesterday.  He is alert and oriented today.  Objective 138/60 79 98.2 F (36.8 C) (Oral) 16 96%  Intake/Output Summary (Last 24 hours) at 11/15/13 0817 Last data filed at 11/14/13 1957  Gross per 24 hour  Intake      0 ml  Output   1200 ml  Net  -1200 ml   Chest x-ray 11/14/2013:  Compared to 11/10/2013 there has been interval improved aeration  in the lung bases. Interstitial and airspace disease in the  bilateral upper lungs remains similar to slightly progressed.  2. Stable position of right subclavian approach port catheter. Chest x-ray 11/10/2013:  Diffuse interstitial opacities concerning for edema or pneumonia.   Chest CT: 11/14/2013 IMPRESSION:  1. No evidence of pulmonary embolism. Mildly motion degraded exam.  2. Extensive ground-glass opacities with septal thickening. Favor  pulmonary edema. Infection, including atypicals, felt less likely.  3. Bilateral pleural effusions.  4. Incompletely image gallstone with gallbladder distention.  Consider with right upper quadrant symptoms and consider focused  ultrasound.  5. Suspicion of trace perihepatic ascites.  6. 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  PE: Dressing changes was done after he received 4 mg IV morphine.  Left AKA stump dressing was removed wound inspection reveals good clean skin edges, muscle is red with small area of yellow eschar.   Wet to dry dressing packed in stump open wound.  Assessment/Planning: POD #2 I and D left AKA wound growing E coli and VRE - on Ceftriaxone and Daptomycin per ID, for 8 weeks Palliative care:  Oxycontin 40mg  q 12, PRN Morphine  Anxiety agitation: Benzodiazepines were scheduled but caused over sedation. Agree with low dose prn  at bedtime. No record of needing benzo  Depression : continue cymbalta  WBC 14.9 from 12.9 yesterday new findings on chest CT. Lasix for edema diuresis  Laurence Slate [facility] 11/15/2013 8:17 AM --  Laboratory Lab Results:  Recent Labs  11/13/13 0530 11/14/13 0432  WBC 12.9* 14.9*  HGB 9.2* 8.4*  HCT 31.1* 28.0*  PLT 589* 569*   BMET  Recent Labs  11/13/13 0530 11/14/13 0432  NA 137 136*  K 4.0 4.2  CL 93* 94*  CO2 30 30  GLUCOSE 152* 165*  BUN 11 10  CREATININE 1.00 0.90  CALCIUM 8.1* 8.4    COAG Lab Results  Component Value Date   INR 1.06 10/07/2013   INR 1.14 09/05/2013   INR 1.01 09/04/2013   No results found for this basename: PTT   Agree with above. Continue 3 times a day dressing changes and intravenous antibiotics. Mild pulmonary edema. Agree with Lasix.  Deitra Mayo, MD, FACS [place] [phone] 11/15/2013

## 2013-11-15 NOTE — Progress Notes (Signed)
Social work notified of need for residental hopsice placement at [facility]. Will continue to monitor.

## 2013-11-15 NOTE — Consult Note (Addendum)
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 Transfer from Rehab 7-2 with non healing stump, S/P debridement 7-2. Wound culture grew VRE and E coli.  During the hospital stay the patient developed paroxysmal A fib. Cardiology was consulted. He converted to sinus rhythm. Patient refused anticoagulation.  getting IV Abx, wound care Underwent L AKA 7/17 Po intake continues to be poor, Hypoxic 7/18  HPI/Subjective: Tired, L leg painful, ate a little yesterday, non yet today  Assessment/Plan:  AKA stump infection - extensive hx of PVD -Per VVS -S/P I&D 10/29/13 - wound growing E coli and VRE - on Ceftriaxone and Daptomycin per ID, for 8 weeks -s/p debridement of L AKA 7/17 -continue wound care, declined this yesterday  Diabetes CBG reasonably controlled at present  -stable, SSI  Acute Diastolic CHF -improving, had diuresed over 3L with IV lasix 7/14 -CT chest 7/18: no PE, pleural effusions and some edema -IV lasix q12 -I/Os, weights  Hypokalemia - improved  Encephalopathy -Probably related to pain medication  -appears to be steadily improving   Hypertension -BP reasonably controlled at this time - on Lopressor and Lisinopril  A fib - as per Cardiology - pt has refused anticoag - is on Aggrenox - rate controlled, cotninue toprol  H/o CVA -mild L hemiparesis -on aggrenox  Dysphagia  -on D3 diet per Speech recs  Anal Sq call carcinoma -fecal incontinence, -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 -followed by Dr. Alen Blew, s/p chemotherapy and XRT to the rectum and then had an ablation to the liver  metastases, no recurrence of liver mets on CT 2/15  Depression -Continue with Cymbalta  Severe protein caloric malnutrition Very poor PO intake , added marinol for appetite glucerna TID also poorly tolerated  Stage III pressure ulcer Wound care following  ETHICS: Prognosis poor given above clinical course, severe malnutrition, very poor PO intake and failure to thrive -Palliative following, i think he needs a Goals of Care Meeting if continues to have failure to thrive  Code Status: FULL Family Communication:none at bedside   DVT prophylaxis: lovenox  Objective: Blood pressure 138/60, pulse 79, temperature 98.2 F (36.8 C), temperature source Oral, resp. rate 16, height 5\' 9"  (1.753 m), weight 67.586 kg (149 lb), SpO2 96.00%.  Intake/Output Summary (Last 24 hours) at 11/15/13 1030 Last data filed at 11/14/13 1957  Gross per 24 hour  Intake      0 ml  Output   1200 ml  Net  -1200 ml   Exam: General: No acute respiratory distress, AAO to self and place only, chronically ill appearing Lungs: diminished BS at bases Cardiovascular: Regular rate without murmur gallop or rub  Abdomen: Nontender, nondistended, soft, bowel sounds positive, no rebound, no ascites, no appreciable mass Extremities: L AKA with dressing  Data Reviewed: Basic Metabolic Panel:  Recent Labs Lab 11/10/13 0520 11/11/13 0500 11/13/13 0530 11/14/13 0432  NA 141 139 137 136*  K 3.1* 3.9 4.0 4.2  CL 108 96 93* 94*  CO2 21 29 30 30   GLUCOSE 112* 154* 152* 165*  BUN 6 8 11 10   CREATININE 0.66 1.02 1.00 0.90  CALCIUM 6.2* 8.5 8.1* 8.4  MG  1.5 1.9  --   --    Liver Function Tests:  Recent Labs Lab 11/10/13 0520  ALBUMIN 1.1*    CBC:  Recent Labs Lab 11/11/13 0500 11/13/13 0530 11/14/13 0432  WBC 17.7* 12.9* 14.9*  HGB 9.4* 9.2* 8.4*  HCT 30.4* 31.1* 28.0*  MCV 86.6 89.9 86.7  PLT 515* 589* 569*   Cardiac Enzymes:  Recent Labs Lab 11/13/13 0530  CKTOTAL 33   CBG:  Recent  Labs Lab 11/14/13 0617 11/14/13 1239 11/14/13 1621 11/14/13 2126 11/15/13 0609  GLUCAP 156* 206* 176* 110* 129*   Studies:  Recent x-ray studies have been reviewed in detail by the Attending Physician  Scheduled Meds:  Scheduled Meds: . cefTRIAXone (ROCEPHIN)  IV  2 g Intravenous Q24H  . collagenase   Topical Daily  . DAPTOmycin (CUBICIN)  IV  500 mg Intravenous Q24H  . dipyridamole-aspirin  1 capsule Oral BID  . dronabinol  2.5 mg Oral QAC lunch  . DULoxetine  60 mg Oral Daily  . enoxaparin (LOVENOX) injection  40 mg Subcutaneous Q24H  . feeding supplement (GLUCERNA SHAKE)  237 mL Oral TID BM  . ferrous fumarate  1 tablet Oral BID  . ferrous sulfate  325 mg Oral BID WC  . furosemide  40 mg Intravenous Q12H  . gabapentin  600 mg Oral TID  . insulin aspart  0-9 Units Subcutaneous TID WC  . lisinopril  2.5 mg Oral Daily  . metoprolol tartrate  50 mg Oral BID  . multivitamin with minerals  1 tablet Oral Daily  . omega-3 acid ethyl esters  2 g Oral BID  . ondansetron  4 mg Intravenous QID  . OxyCODONE  40 mg Oral Q12H  . pantoprazole  40 mg Oral Daily  . saccharomyces boulardii  250 mg Oral BID  . senna-docusate  2 tablet Oral BID  . vitamin C  500 mg Oral Daily    Time spent on care of this patient: 25 mins   [name] , MD  [phone]  If 7PM-7AM, please contact night-coverage [email or web address] Password [email or web address] 11/15/2013, 10:30 AM   LOS: 17 days

## 2013-11-15 NOTE — Progress Notes (Signed)
In to talk with patient with family at bedside. Pt back from CT scan. Tolerated well. Asked patient about dressing change, and asked him to let me change the dressing in his left stump. Pt refused. Told patient we could do it after pain medicine give. I asked patient to think about it.

## 2013-11-15 NOTE — Progress Notes (Addendum)
Palliative Medicine Team Progress Note  Wife requested meeting today to discuss patient's goals of care. He has become increasingly more confused, has had minimal PO intake, he is refusing all dressing changes due to severe intractable pain with even minimal movement of his leg. He is refusing medications- reports IV antibiotics make him nauseated and "all of the pills". Family and patient himself endorse feeling exhausted and patient has asserted a desire for no further surgical interventions and is saying that he is "losing this battle". He tells me that he knows he is "not going to survive this", "the infection is going to get me" and that he is mostly "worried about his wife" when he is gone. He is hallucinating during our discussion and asks several times "who is at the door?" He then tells me how strong his family is and that he knows they will be ok. We discussed the option of shifting towards full comfort and Hospice care. Family and patient to the best of his ability have weighed in on all options including SNF, LTACH, and the option of hospice facility care. They want compassionate EOL care, they also want sedation with dressing changes and consideration of daily instead of TID drg changes based on the goal of wanting comfort not necessarily cure which doesn't seem likely anyway.    Shift toward full comfort care  DNR  Hospice Facility for EOL care, symptom management when bed available.  Continue IV antibiotics until discharged to a  hospice facility unless nausea persists  Discontinue all non-essential meedications and those for comfort  Only check CBG qAM and treat if >250 or symptomatic  Only check vitals daily  Aggressive pain and symptom management- should get IV morphine prior to drg change.  Wean down cymbalta and neurontin- at this point will likely be shifting to opiates primarily for pain control- he has unreliable swallowing.  He will need expert pain and symptom  management at EOL which can best be achieved in a hospice facility- I would recommend using versed or ketamine prior to drg changes (they are excruciating for him). Needs to be used in the context of EOL care without a conscious sedation label- may be difficult in the hospital setting to achieve this level of symptom management.  CSW order written for hospice facility choice. I have adjusted pain regimen and modified orders for comfort care.  Time: 3PM-4:30PM Total Time:  90 min  Lane Hacker, [place] [facility] [phone]

## 2013-11-16 LAB — GLUCOSE, CAPILLARY: Glucose-Capillary: 128 mg/dL — ABNORMAL HIGH (ref 70–99)

## 2013-11-16 NOTE — Consult Note (Signed)
[place] Liaison: Received notification of referral from weekend social worker at 4:45 Sunday, 11/15/13. Chart reviewed this am. Received followup call from weekday social worker this am. Room available today, paper work completed with spouse at bedside. Dr. Orpah Melter to assume care per patient/family request. Please fax discharge summary to [phone] and RN call report to [phone]. Please arrange for Mr. Dorvil to arrive by noon. Thank you. Erling Conte LCSW [phone]

## 2013-11-16 NOTE — Progress Notes (Signed)
Port re-accessed by IV team without difficulty. Pt states he will not pull it out. Pt states he was waking up and didn't realize he was pulling it. Pt appears oriented and appropriate at this time. Pt denies pain or discomfort at this time. Pt encouraged to call to let know if hurting.

## 2013-11-16 NOTE — Progress Notes (Signed)
CSW Armed forces technical officer) spoke with family and they confirmed they are open to pt discharging to [facility]. [facility] social worker informed CSW paperwork is complete and facility requesting pt be at facility by noon. CSW called PA [place] and notified.   [place], [place]

## 2013-11-16 NOTE — Progress Notes (Signed)
  [facility] Progress Note  11/16/2013 7:46 AM 3 Days Post-Op  Subjective:  Alert and oriented this morning. Refusing dressing changes, saying "maybe later." Asking for pain medication. Does want to continue antibiotics and other medications.   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  Lungs: decreased breath sounds anterior, no increased respiratory effort Cardiac: regular rate   CBC    Component Value Date/Time   WBC 14.9* 11/14/2013 0432   WBC 7.1 06/05/2013 1010   RBC 3.23* 11/14/2013 0432   RBC 4.23 06/05/2013 1010   RBC 3.54* 01/26/2012 0455   HGB 8.4* 11/14/2013 0432   HGB 9.3* 06/05/2013 1010   HCT 28.0* 11/14/2013 0432   HCT 30.8* 06/05/2013 1010   PLT 569* 11/14/2013 0432   PLT 324 06/05/2013 1010   MCV 86.7 11/14/2013 0432   MCV 72.9* 06/05/2013 1010   MCH 26.0 11/14/2013 0432   MCH 22.0* 06/05/2013 1010   MCHC 30.0 11/14/2013 0432   MCHC 30.2* 06/05/2013 1010   RDW 19.5* 11/14/2013 0432   RDW 16.9* 06/05/2013 1010   LYMPHSABS 1.0 10/28/2013 0500   LYMPHSABS 1.6 06/05/2013 1010   MONOABS 1.2* 10/28/2013 0500   MONOABS 0.5 06/05/2013 1010   EOSABS 0.1 10/28/2013 0500   EOSABS 0.1 06/05/2013 1010   BASOSABS 0.0 10/28/2013 0500   BASOSABS 0.1 06/05/2013 1010    BMET    Component Value Date/Time   NA 136* 11/14/2013 0432   NA 137 06/05/2013 1010   NA 147* 10/12/2011 0959   K 4.2 11/14/2013 0432   K 5.4* 06/05/2013 1010   K 5.2* 10/12/2011 0959   CL 94* 11/14/2013 0432   CL 102 05/19/2012 1427   CL 102 10/12/2011 0959   CO2 30 11/14/2013 0432   CO2 23 06/05/2013 1010   CO2 29 10/12/2011 0959   GLUCOSE 165* 11/14/2013 0432   GLUCOSE 239* 06/05/2013 1010   GLUCOSE 205* 05/19/2012 1427   GLUCOSE 244* 10/12/2011 0959   BUN 10 11/14/2013 0432   BUN 19.5 06/05/2013 1010   BUN 13 10/12/2011 0959   CREATININE 0.90 11/14/2013 0432   CREATININE 1.4* 06/05/2013 1010   CREATININE 1.0 10/12/2011 0959   CALCIUM 8.4 11/14/2013 0432   CALCIUM 10.1 06/05/2013 1010   CALCIUM 9.0 10/12/2011 0959   GFRNONAA 85* 11/14/2013 0432   GFRAA >90 11/14/2013 0432    INR    Component Value Date/Time   INR 1.06 10/07/2013 1748     Intake/Output Summary (Last 24 hours) at 11/16/13 0746 Last data filed at 11/16/13 0600  Gross per 24 hour  Intake    295 ml  Output   2100 ml  Net  -1805 ml     Assessment:  68 y.o. male is s/p: I&D left AKA   3 Days Post-Op  Plan: -Wound growing E. Coli and VRE: continue antibiotics -Palliative care: Continue pain control and remaining medications. Patient still wants all medications.  -Will delay dressing changes for later today or until patient decides. Administer morphine prior to dressing changes.  -Severe protein malnutrition: glucerna TID -Pulmonary edema:  continue lasix -DVT prophylaxis:  Lovenox   Virgina Jock, PA-C [facility] Office: [phone] Pager: [phone] 11/16/2013 7:46 AM

## 2013-11-16 NOTE — Discharge Summary (Signed)
[facility] Discharge Summary  Donald [name] [birth year] 68 y.o. male  [id number]  Admission Date: 10/29/2013  Discharge Date: 11/16/2013  Physician: Angelia Mould, MD  Admission Diagnosis: L AKA OLD CVA Non-healing surgical wound, initial encounter    HPI:   This is a 68 y.o. male with PMH of CVA with left-sided weakness, HTN, PVD  with multiple revascularization procedures who underwent a left above-the-knee amputation by Dr. Trula Slade on 10/07/13 due to recurrent thrombosis of his left femoral popliteal bypass.  He was then transferred to rehabilitation. It was noted during his rehabilitation that there was significant breakdown of his above-the-knee amputation. Dr. Scot Dock debrided his wound. Intraoperatively he was found to have significant necrotic muscle tissue in addition to gross purulence. He was readmitted for aggressive wound care in hopes of getting the infection controlled enough that he could ultimately have a hip disarticulation on the left.    Hospital Course:  The patient was admitted to the hospital and taken to the operating room on 10/29/2013 - 11/13/2013 and underwent: irrigation and debridement of left above-the-knee amputation. The pt tolerated the procedure well and was transported to the PACU in fair condition. His wound grew E. Coli and VRE and was placed on IV cubicin and rocephin per ID. With aggressive wound care, his left AKA stump still required further debridement. He was taken back to the OR for irrigation and debridement on 11/13/13. A hip disarticulation was suggested for the future but the patient refused this idea. The patient did not tolerate dressing changes well and started refusing them. He had been receiving aggressive nutritional support for severe protein malnutrition and wound care for his sacral decubitus ulcer. The patient and family desired no further surgical interventions. Palliative care had been following and the  patient decided to shift towards full comfort care on 11/15/13.    He was discharged on 11/16/13 to [facility] and palliative care in fair condition.   CBC    Component Value Date/Time   WBC 14.9* 11/14/2013 0432   WBC 7.1 06/05/2013 1010   RBC 3.23* 11/14/2013 0432   RBC 4.23 06/05/2013 1010   RBC 3.54* 01/26/2012 0455   HGB 8.4* 11/14/2013 0432   HGB 9.3* 06/05/2013 1010   HCT 28.0* 11/14/2013 0432   HCT 30.8* 06/05/2013 1010   PLT 569* 11/14/2013 0432   PLT 324 06/05/2013 1010   MCV 86.7 11/14/2013 0432   MCV 72.9* 06/05/2013 1010   MCH 26.0 11/14/2013 0432   MCH 22.0* 06/05/2013 1010   MCHC 30.0 11/14/2013 0432   MCHC 30.2* 06/05/2013 1010   RDW 19.5* 11/14/2013 0432   RDW 16.9* 06/05/2013 1010   LYMPHSABS 1.0 10/28/2013 0500   LYMPHSABS 1.6 06/05/2013 1010   MONOABS 1.2* 10/28/2013 0500   MONOABS 0.5 06/05/2013 1010   EOSABS 0.1 10/28/2013 0500   EOSABS 0.1 06/05/2013 1010   BASOSABS 0.0 10/28/2013 0500   BASOSABS 0.1 06/05/2013 1010    BMET    Component Value Date/Time   NA 136* 11/14/2013 0432   NA 137 06/05/2013 1010   NA 147* 10/12/2011 0959   K 4.2 11/14/2013 0432   K 5.4* 06/05/2013 1010   K 5.2* 10/12/2011 0959   CL 94* 11/14/2013 0432   CL 102 05/19/2012 1427   CL 102 10/12/2011 0959   CO2 30 11/14/2013 0432   CO2 23 06/05/2013 1010   CO2 29 10/12/2011 0959   GLUCOSE 165* 11/14/2013 0432   GLUCOSE 239* 06/05/2013  1010   GLUCOSE 205* 05/19/2012 1427   GLUCOSE 244* 10/12/2011 0959   BUN 10 11/14/2013 0432   BUN 19.5 06/05/2013 1010   BUN 13 10/12/2011 0959   CREATININE 0.90 11/14/2013 0432   CREATININE 1.4* 06/05/2013 1010   CREATININE 1.0 10/12/2011 0959   CALCIUM 8.4 11/14/2013 0432   CALCIUM 10.1 06/05/2013 1010   CALCIUM 9.0 10/12/2011 0959   GFRNONAA 85* 11/14/2013 0432   GFRAA >90 11/14/2013 0432     Discharge Instructions:   The patient is discharged to hospice and palliative care with extensive instructions on wound care and progressive ambulation.  They are instructed not to drive or perform any  heavy lifting until returning to see the physician in his office.  Discharge Instructions   Call MD for:  redness, tenderness, or signs of infection (pain, swelling, bleeding, redness, odor or green/yellow discharge around incision site)    Complete by:  As directed      Call MD for:  severe or increased pain, loss or decreased feeling  in affected limb(s)    Complete by:  As directed      Call MD for:  temperature >100.5    Complete by:  As directed      Change dressing (specify)    Complete by:  As directed   As specified by patient.     Resume previous diet    Complete by:  As directed            Discharge Diagnosis:  L AKA OLD CVA Non-healing surgical wound, initial encounter   Secondary Diagnosis: Patient Active Problem List   Diagnosis Date Noted  . CAD- CTO RCA Sept 2013- medical Rx 11/02/2013  . History of CVA - 2008 11/02/2013  . PAF (paroxysmal atrial fibrillation) 11/02/2013  . Non-healing wound of amputation stump 10/29/2013  . S/P AKA 10/08/13 10/13/2013  . Uncontrolled pain 10/10/2013  . Protein-calorie malnutrition, severe 10/09/2013  . Diabetes mellitus with circulatory complication 18/84/1660  . HTN (hypertension) 10/07/2013  . Anemia 10/07/2013  . Ischemia of extremity 10/07/2013  . Ischemic leg 09/05/2013  . Wound discharge 04/09/2013  . Hemiparesis affecting left side as late effect of stroke 01/05/2013  . Abnormality of gait 09/03/2012  . Other acquired deformity of ankle and foot(736.79) 09/03/2012  . Aftercare following surgery of the circulatory system, [place] 05/15/2012  . 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 Peripheral vascular disease 03/06/2012  . Occlusion and stenosis of carotid artery without mention of cerebral infarction 02/21/2012  . Physical deconditioning 01/31/2012  . Atherosclerotic peripheral vascular disease with rest pain 01/24/2012  . Preoperative evaluation to rule out surgical  contraindication 01/24/2012  . Anal cancer 02/26/2011  . [place] MALIGNANT NEOPLASM OF LARGE INTESTINE&RECTUM 11/09/2008  . NAUSEA AND VOMITING 11/09/2008  . CONSTIPATION 08/18/2008  . RECTAL BLEEDING 08/18/2008  . ABDOMINAL PAIN -GENERALIZED 08/18/2008  . HYPERLIPIDEMIA 08/17/2008  . HEMORRHOIDS 08/17/2008   Past Medical History  Diagnosis Date  . GERD (gastroesophageal reflux disease)   . Hyperlipidemia   . Hypertension   . Peripheral vascular disease   . Internal and external bleeding hemorrhoids   . History of gout     left great toe  . Depression   . Numbness in right leg     Hx: of diabetes  . Coronary artery disease     RCA occlusion with good collaterals, o/w no obs CAD 12/2011  . Pneumonia ~ 1977  . Type II diabetes  mellitus   . History of blood transfusion 12/2011; 12/2012    "before OR; after OR" (01/22/2013)  . Stroke 2008    "left side weak; unable to move left hand still" (01/22/2013)  . Rectal cancer dx'd 08/2007    S/P chemo, radiation, biopsies  . Metastases to the liver dx'd 08/2010       Medication List         dipyridamole-aspirin 200-25 MG per 12 hr capsule  Commonly known as:  AGGRENOX  Take 1 capsule by mouth 2 (two) times daily.     DSS 100 MG Caps  Take 100 mg by mouth 2 (two) times daily.     docusate sodium 100 MG capsule  Commonly known as:  COLACE  Take 200 mg by mouth 2 (two) times daily as needed for mild constipation.     DULoxetine 30 MG capsule  Commonly known as:  CYMBALTA  Take 1 capsule (30 mg total) by mouth daily.     feeding supplement (GLUCERNA SHAKE) Liqd  Take 237 mLs by mouth daily.     ferrous fumarate 325 (106 FE) MG Tabs tablet  Commonly known as:  HEMOCYTE - 106 mg FE  Take 1 tablet (106 mg of iron total) by mouth 2 (two) times daily.     ferrous sulfate 325 (65 FE) MG tablet  Take 1 tablet (325 mg total) by mouth 2 (two) times daily with a meal.     gabapentin 300 MG capsule  Commonly known as:  NEURONTIN  Take 1  capsule (300 mg total) by mouth 3 (three) times daily.     insulin detemir 100 UNIT/ML injection  Commonly known as:  LEVEMIR  Inject 10 Units into the skin daily.     lisinopril 5 MG tablet  Commonly known as:  PRINIVIL,ZESTRIL  Take 2.5 mg by mouth daily.     metFORMIN 500 MG tablet  Commonly known as:  GLUCOPHAGE  Take 500 mg by mouth 2 (two) times daily with a meal.     omega-3 acid ethyl esters 1 G capsule  Commonly known as:  LOVAZA  Take 2 g by mouth 2 (two) times daily.     omeprazole 40 MG capsule  Commonly known as:  PRILOSEC  Take 40 mg by mouth daily.     oxyCODONE 5 MG immediate release tablet  Commonly known as:  Oxy IR/ROXICODONE  Take 1-2 tablets (5-10 mg total) by mouth every 6 (six) hours as needed for severe pain.     OxyCODONE 40 mg T12a 12 hr tablet  Commonly known as:  OXYCONTIN  Take 1 tablet (40 mg total) by mouth every 12 (twelve) hours.     oxyCODONE-acetaminophen 5-325 MG per tablet  Commonly known as:  PERCOCET/ROXICET  Take 1 tablet by mouth every 4 (four) hours as needed for moderate pain or severe pain.     saccharomyces boulardii 250 MG capsule  Commonly known as:  FLORASTOR  Take 1 capsule (250 mg total) by mouth 2 (two) times daily. probiotic     senna 8.6 MG Tabs tablet  Commonly known as:  SENOKOT  Take 2 tablets (17.2 mg total) by mouth at bedtime.     traMADol 50 MG tablet  Commonly known as:  ULTRAM  Take 1 tablet (50 mg total) by mouth every 6 (six) hours as needed for moderate pain.     traZODone 50 MG tablet  Commonly known as:  DESYREL  Take 0.5-1 tablets (25-50 mg total)  by mouth at bedtime as needed for sleep.     vitamin C 1000 MG tablet  Take 500 mg by mouth daily.          Disposition: [facility]  Patient's condition: is Fair  Follow up: 1. Dr. Scot Dock as needed.    Virgina Jock, PA-C [facility] [phone] 11/16/2013  10:46 AM

## 2013-11-16 NOTE — Discharge Summary (Signed)
Agree with plans for D/C.  Deitra Mayo, MD, [place] [phone] 11/16/2013

## 2013-11-16 NOTE — Progress Notes (Signed)
Pt has been resting well. In to check bed pad to check skin. Pt easily aroused and woken. He states he is doing fine. Pt would like pain pills ordered, oxycontin for pain. Pt able to sit up and take pills without trouble. Pt made comfortable in bed with pillows.Will continue to assess.

## 2013-11-16 NOTE — Progress Notes (Signed)
OT Cancellation Note  Patient Details Name: Donald [name] MRN: [id number] DOB: 06/15/1945   Cancelled Treatment:    Reason Eval/Treat Not Completed: Other (comment) OT has reviewed this chart, noting palliative care note of 7/19 indicating patient and family desire comfort care at this time.  OT will sign off at this time.  Please re-order if situation changes.  No further OT warranted at this time.  Thanks for this referral.    Mariah Milling 11/16/2013, 8:18 AM

## 2013-11-16 NOTE — Progress Notes (Signed)
Tele removed, pt bathed, gown changed, and foam dressings changed Rickard Rhymes, RN

## 2013-11-16 NOTE — Progress Notes (Signed)
Report called to [place] at [facility], pt aware of transfer Rickard Rhymes, RN

## 2013-11-16 NOTE — Progress Notes (Signed)
Pt resting without distress. Will continue to assess

## 2013-11-16 NOTE — Progress Notes (Signed)
Pt woke up from sleeping and said he was slightly confused and didn't know what he was doing. He pulled the access needle to his porta cath out. Needle was intact and placed in sharps container. IV team paged and made aware.Will continue to assess and re-orient patient. Pt states that he is comfortable and denies needs.

## 2013-11-16 NOTE — Progress Notes (Signed)
CSW (Clinical Education officer, museum) prepared pt dc packet and placed with shadow chart. CSW arranged non-emergent ambulance transport. Pt, pt family, pt nurse, and facility informed. CSW provided nurse with number to call report.  CSW signing off.  [place], [place]

## 2013-11-17 ENCOUNTER — Encounter (HOSPITAL_COMMUNITY): Payer: Self-pay | Admitting: Vascular Surgery

## 2013-11-24 ENCOUNTER — Telehealth: Payer: Self-pay | Admitting: Oncology

## 2013-11-24 NOTE — Telephone Encounter (Signed)
moved 8/11 appt to AM due to call - lmonvm for pt and mailed schedule.

## 2013-12-02 ENCOUNTER — Encounter: Payer: Self-pay | Admitting: Cardiology

## 2013-12-08 ENCOUNTER — Ambulatory Visit: Payer: Medicare Other | Admitting: Oncology

## 2013-12-08 ENCOUNTER — Other Ambulatory Visit: Payer: Medicare Other

## 2013-12-17 ENCOUNTER — Ambulatory Visit: Payer: Medicare Other | Admitting: Vascular Surgery

## 2013-12-17 ENCOUNTER — Encounter (HOSPITAL_COMMUNITY): Payer: Medicare Other

## 2013-12-17 ENCOUNTER — Other Ambulatory Visit (HOSPITAL_COMMUNITY): Payer: Medicare Other

## 2013-12-29 DEATH — deceased

## 2014-02-26 ENCOUNTER — Other Ambulatory Visit: Payer: Self-pay | Admitting: Nurse Practitioner

## 2014-03-17 ENCOUNTER — Encounter: Payer: Self-pay | Admitting: Neurology

## 2014-03-23 ENCOUNTER — Encounter: Payer: Self-pay | Admitting: Neurology

## 2014-04-08 ENCOUNTER — Encounter (HOSPITAL_COMMUNITY): Payer: Self-pay | Admitting: Cardiovascular Disease

## 2015-07-05 IMAGING — CR DG ANG/EXT/UNI/OR LEFT
1 series · 1 of 1 positions shown · non-contrast
Comparison: 01/06/2013

CLINICAL DATA: Evaluate for flow. History of left femoral-popliteal
bypass graft.

[facility] DER/EXT/UNI/ OR
TECHNIQUE: C-arm fluoroscopic images were obtained
intraoperatively and submitted for postoperative interpretation.
Please see the performing provider's procedural report for the
fluoroscopy time utilized.

[AP]
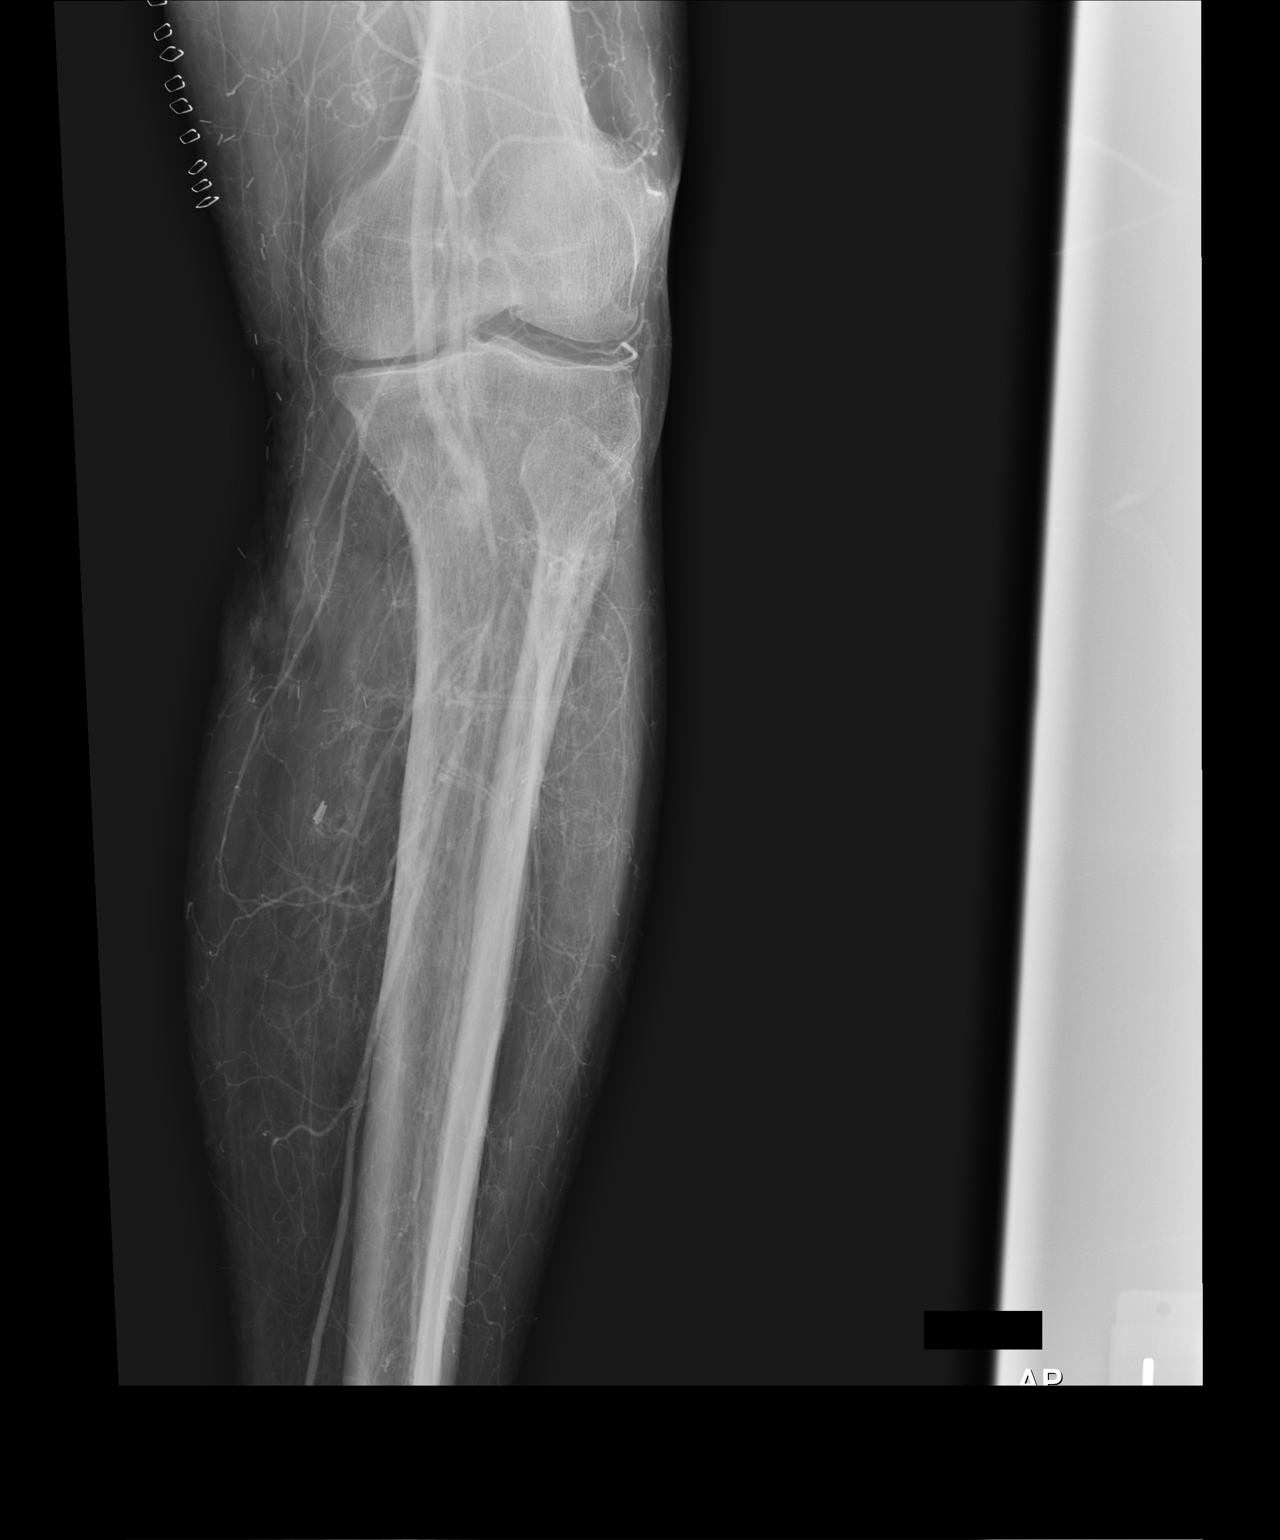

[1 of 1 positions shown; findings below may reference images not displayed]

FINDINGS: Single angiographic image of the left lower leg was
obtained.  There is poor opacification of the vascular structures
and evidence for some venous contamination.  There is a bypass
graft that ties into the distal popliteal artery.  There is flow
within the posterior tibial artery and probably within the peroneal
artery.
IMPRESSION: Flow within the bypass graft and runoff vessels as described.

## 2016-02-27 IMAGING — CR DG HIP (WITH OR WITHOUT PELVIS) 2-3V*L*
3 series · 3 of 3 positions shown · non-contrast
Comparison: None.

CLINICAL DATA: Fall with left hip pain.  Evaluate for fracture.

EXAM:
LEFT HIP - COMPLETE 2+ VIEW

[t pelvis a.p.]
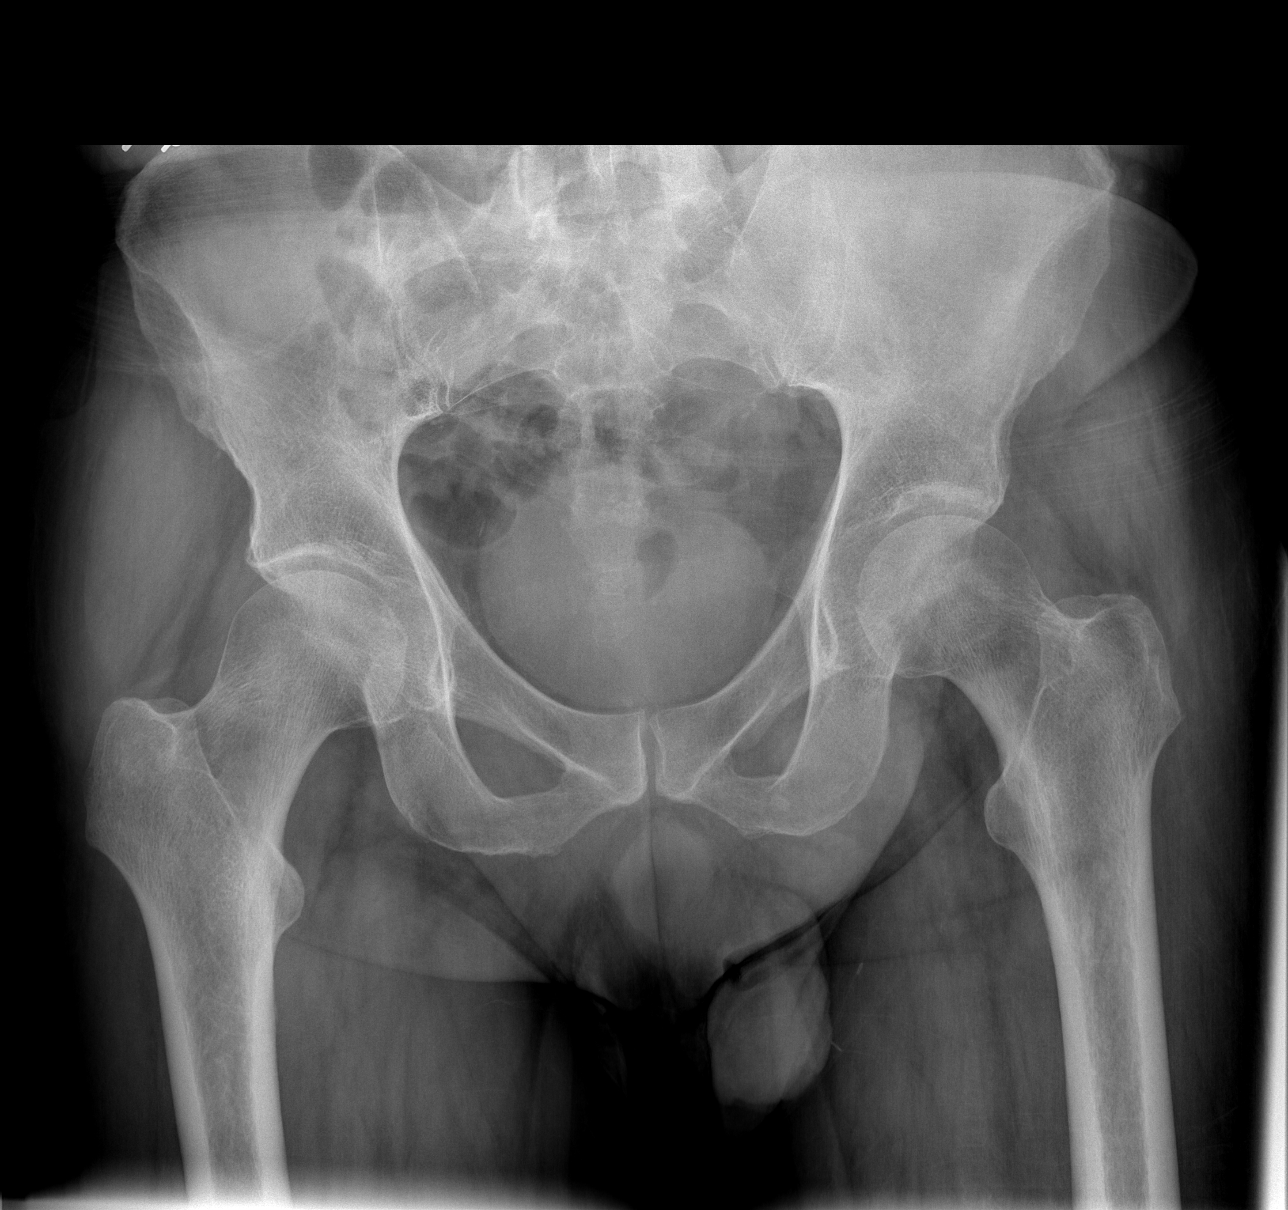

[t hip ap left]
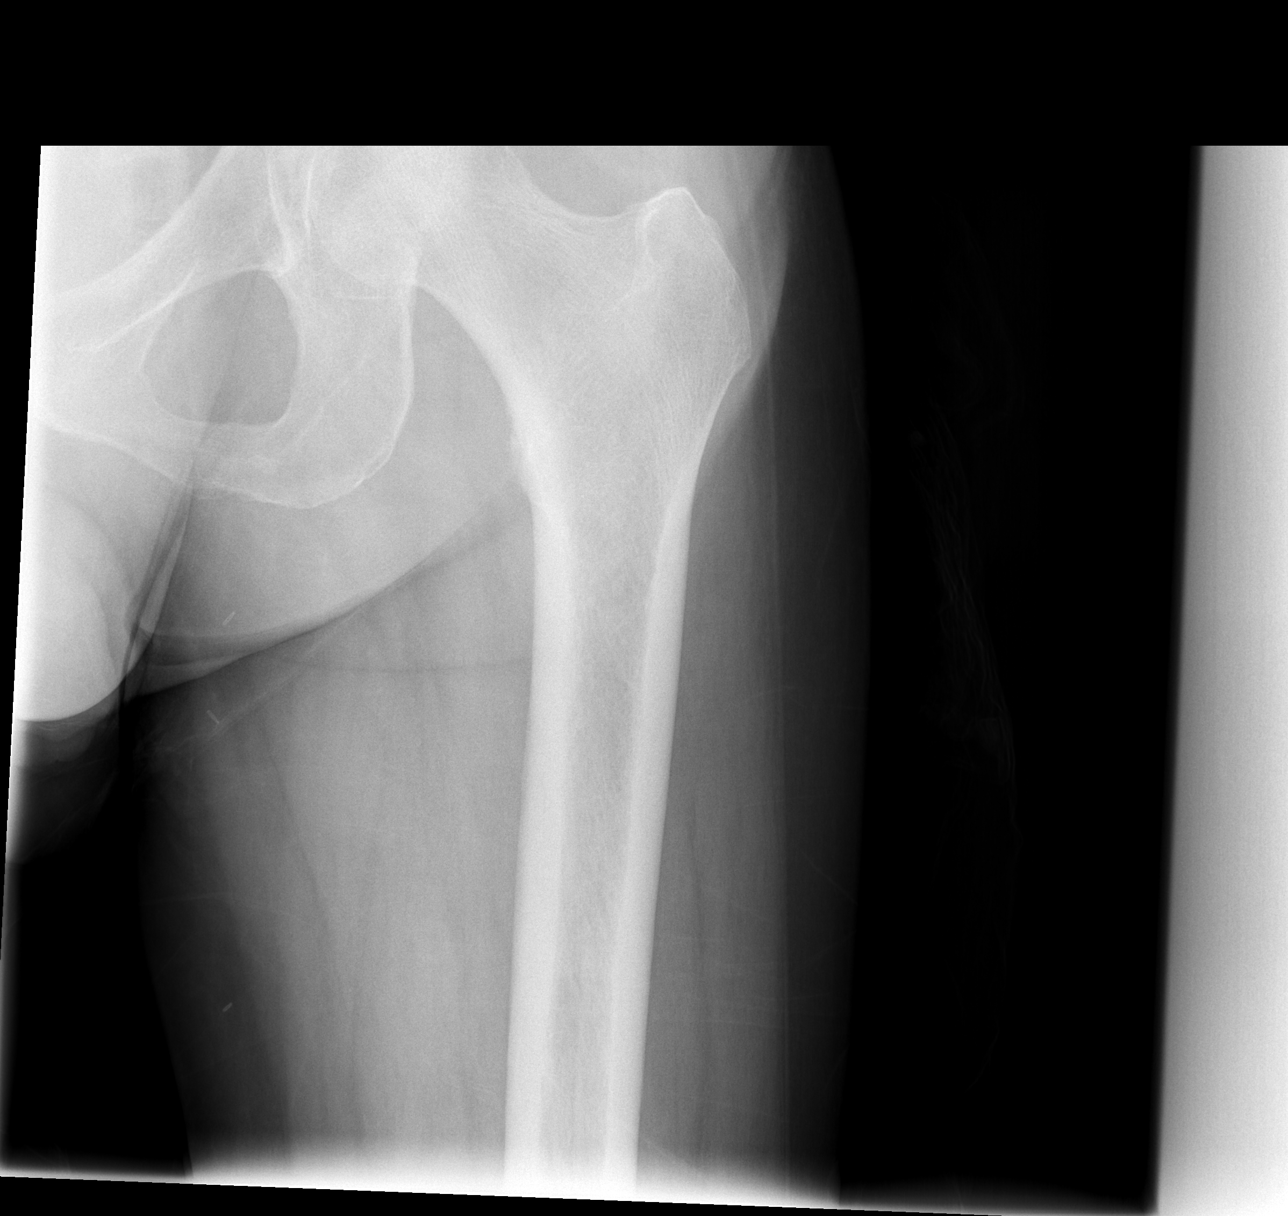

[t hip frog leg left]
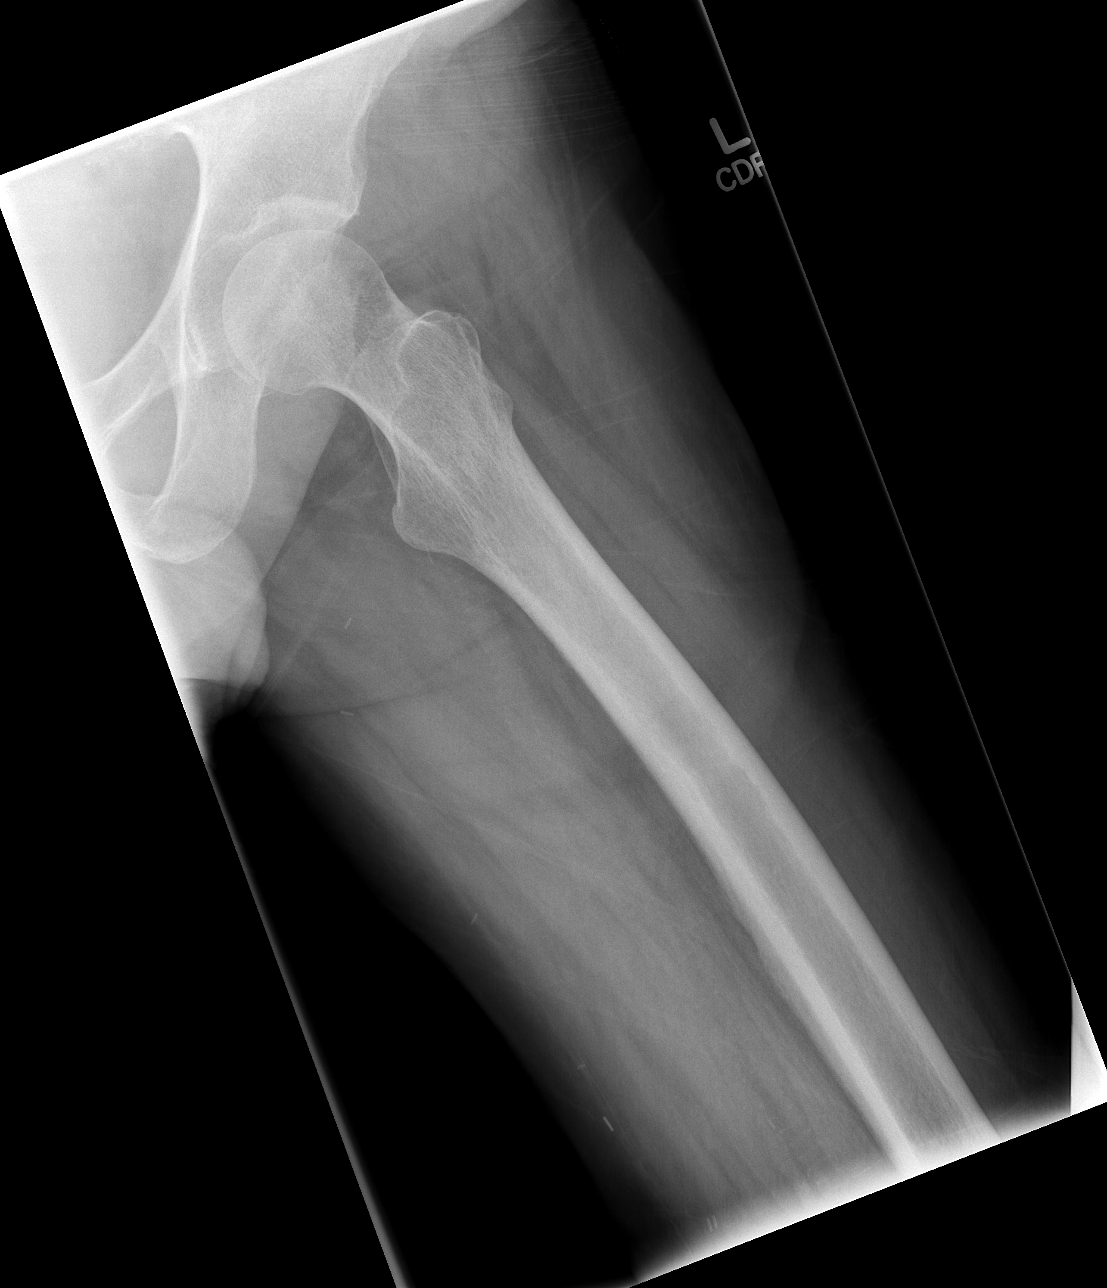

[3 of 3 positions shown; findings below may reference images not displayed]

FINDINGS: Mild irregularity of the left inferior pubic ramus appears similar
to scanogram from 06/05/2013 abdominal CT. No evidence of acute
pelvic ring fracture or proximal femur fracture. No dislocation.
IMPRESSION: No acute osseous abnormality.

## 2016-02-28 IMAGING — CR DG CHEST 1V PORT
2 series · 2 of 2 positions shown · non-contrast
Comparison: Chest x-ray from yesterday

CLINICAL DATA: Central line.

EXAM:
PORTABLE CHEST - 1 VIEW

[AP (1 of 2)]
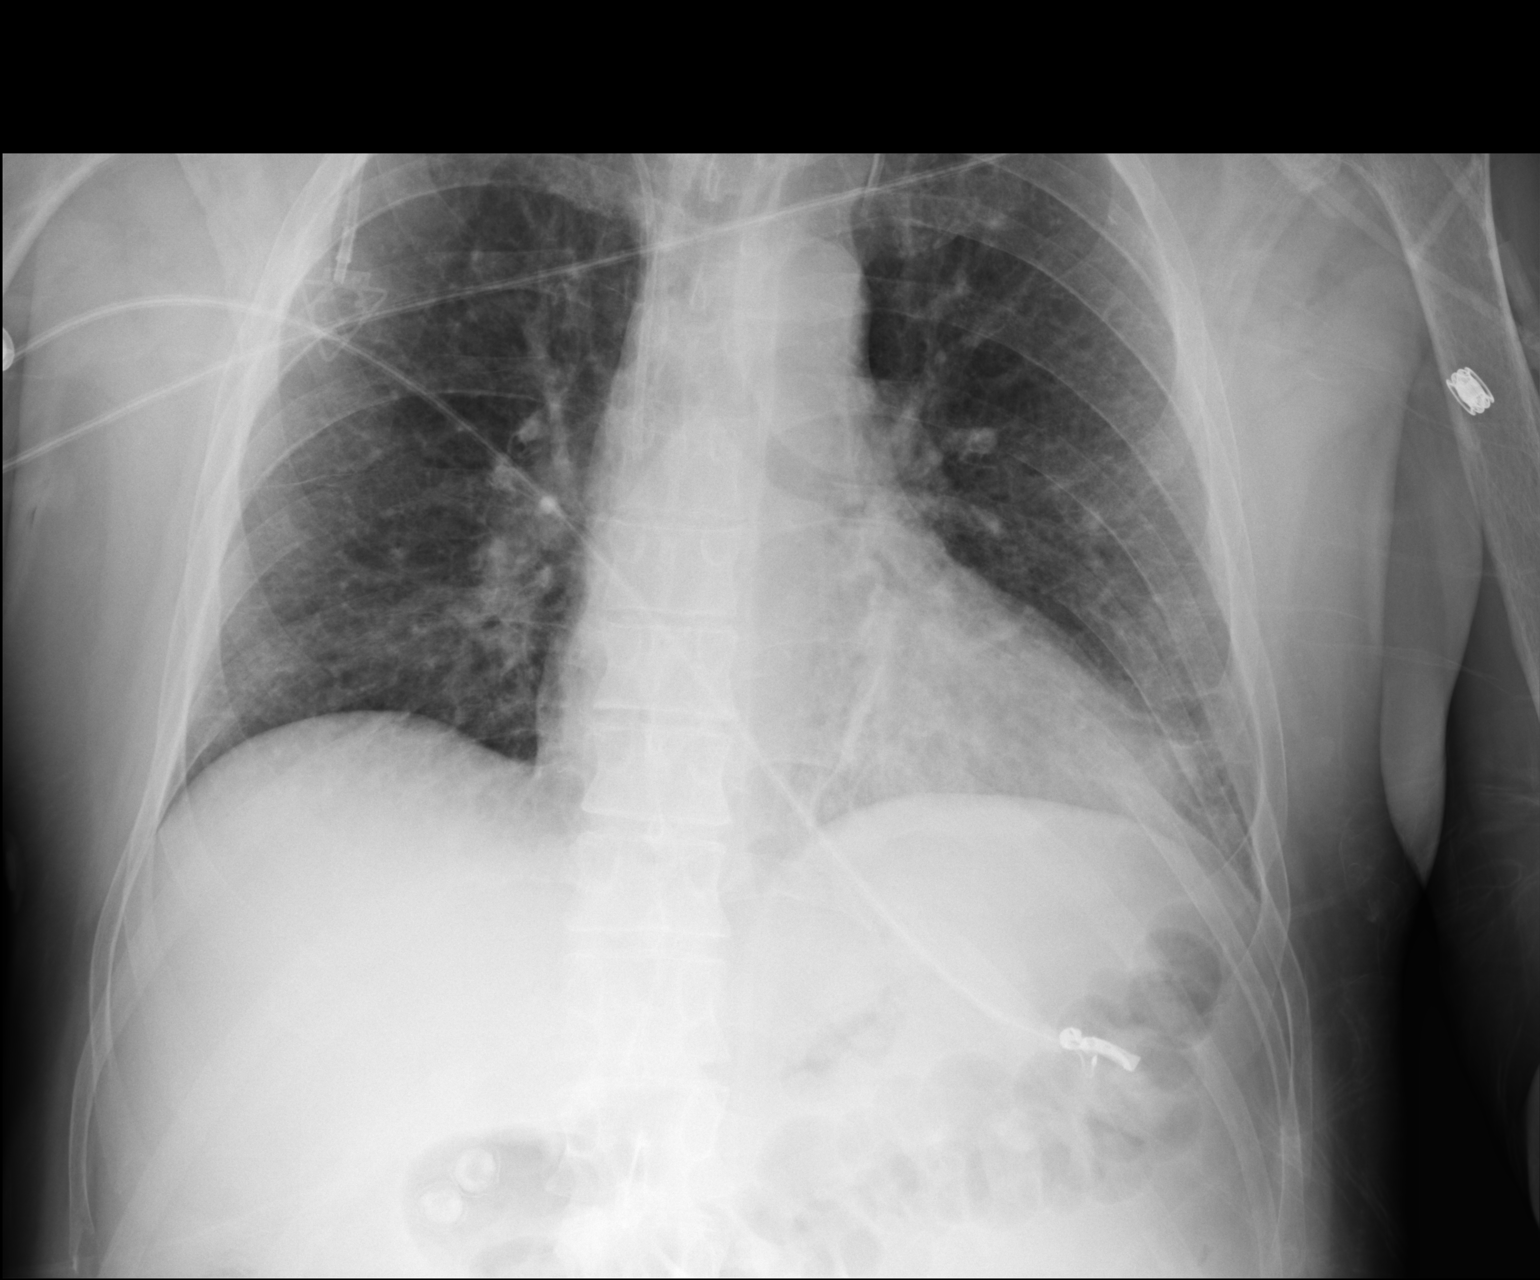

[AP (2 of 2)]
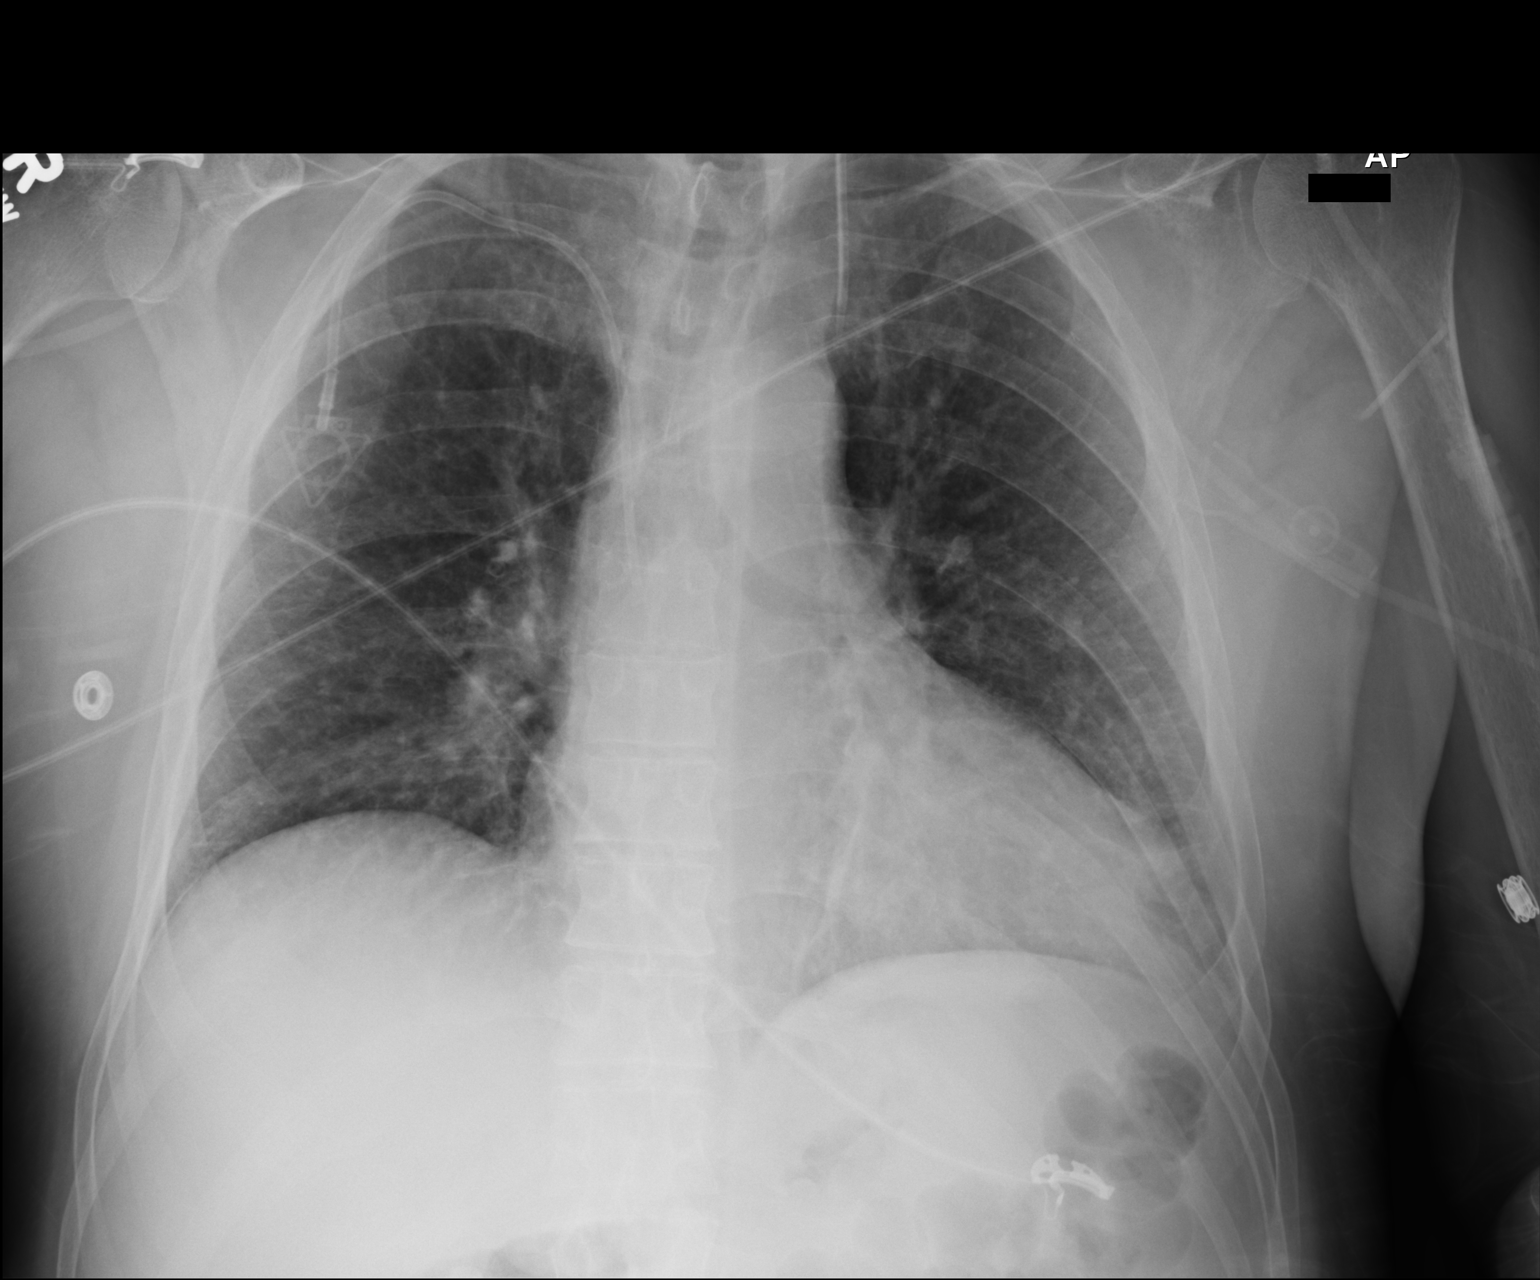

[2 of 2 positions shown; findings below may reference images not displayed]

FINDINGS: There is a right subclavian porta catheter, tip at the level of the
upper right atrium. There is a new left IJ catheter, if venous tip
in the region of the left IJ/ brachiocephalic confluence. No
persistent left SVC noted on chest CT 01/09/2013. These results were
called by telephone at the time of interpretation on 09/06/2013 at
[DATE] to Dr. DRUSTVO SOSTAR , who verbally acknowledged these
results.

Cardiomegaly. There are Kerley B-lines bilaterally which are new.
Streaky retrocardiac atelectasis. No pneumothorax. No upper
mediastinal widening.
IMPRESSION: 1. Left neck catheter, if venous tip is in the region of the left
internal jugular/brachiocephalic vein confluence. No pneumothorax.
2. Mild pulmonary edema.
3. Mild atelectasis.
